# Patient Record
Sex: Female | Born: 1935
Health system: Southern US, Community
[De-identification: ages and names within clinical notes are randomized; demographics above are authoritative.]

## PROBLEM LIST (undated history)

## (undated) DIAGNOSIS — C50411 Malignant neoplasm of upper-outer quadrant of right female breast: Secondary | ICD-10-CM

## (undated) DIAGNOSIS — I499 Cardiac arrhythmia, unspecified: Secondary | ICD-10-CM

## (undated) DIAGNOSIS — B351 Tinea unguium: Secondary | ICD-10-CM

## (undated) DIAGNOSIS — J302 Other seasonal allergic rhinitis: Secondary | ICD-10-CM

## (undated) DIAGNOSIS — M1990 Unspecified osteoarthritis, unspecified site: Secondary | ICD-10-CM

## (undated) DIAGNOSIS — I872 Venous insufficiency (chronic) (peripheral): Secondary | ICD-10-CM

## (undated) DIAGNOSIS — Z8041 Family history of malignant neoplasm of ovary: Secondary | ICD-10-CM

## (undated) DIAGNOSIS — R600 Localized edema: Secondary | ICD-10-CM

## (undated) DIAGNOSIS — E049 Nontoxic goiter, unspecified: Secondary | ICD-10-CM

## (undated) DIAGNOSIS — Z974 Presence of external hearing-aid: Secondary | ICD-10-CM

## (undated) DIAGNOSIS — Z853 Personal history of malignant neoplasm of breast: Secondary | ICD-10-CM

## (undated) DIAGNOSIS — C4492 Squamous cell carcinoma of skin, unspecified: Secondary | ICD-10-CM

## (undated) DIAGNOSIS — N951 Menopausal and female climacteric states: Secondary | ICD-10-CM

## (undated) DIAGNOSIS — Z973 Presence of spectacles and contact lenses: Secondary | ICD-10-CM

## (undated) DIAGNOSIS — I10 Essential (primary) hypertension: Secondary | ICD-10-CM

## (undated) DIAGNOSIS — Z803 Family history of malignant neoplasm of breast: Secondary | ICD-10-CM

## (undated) HISTORY — PX: UMBILICAL HERNIA REPAIR: SHX196

## (undated) HISTORY — DX: Tinea unguium: B35.1

## (undated) HISTORY — DX: Menopausal and female climacteric states: N95.1

## (undated) HISTORY — PX: CATARACT EXTRACTION: SUR2

## (undated) HISTORY — PX: DILATION AND CURETTAGE OF UTERUS: SHX78

## (undated) HISTORY — DX: Other seasonal allergic rhinitis: J30.2

## (undated) HISTORY — DX: Malignant neoplasm of upper-outer quadrant of right female breast: C50.411

## (undated) HISTORY — PX: OTHER SURGICAL HISTORY: SHX169

## (undated) HISTORY — DX: Personal history of malignant neoplasm of breast: Z85.3

## (undated) HISTORY — DX: Venous insufficiency (chronic) (peripheral): I87.2

## (undated) HISTORY — PX: TONSILLECTOMY: SUR1361

## (undated) HISTORY — DX: Family history of malignant neoplasm of ovary: Z80.41

## (undated) HISTORY — DX: Family history of malignant neoplasm of breast: Z80.3

## (undated) HISTORY — DX: Unspecified osteoarthritis, unspecified site: M19.90

## (undated) HISTORY — DX: Nontoxic goiter, unspecified: E04.9

## (undated) HISTORY — DX: Squamous cell carcinoma of skin, unspecified: C44.92

---

## 1981-07-19 HISTORY — PX: OTHER SURGICAL HISTORY: SHX169

## 1999-01-12 ENCOUNTER — Other Ambulatory Visit: Admission: RE | Admit: 1999-01-12 | Discharge: 1999-01-12 | Payer: Self-pay | Admitting: Obstetrics and Gynecology

## 2000-06-20 ENCOUNTER — Ambulatory Visit (HOSPITAL_COMMUNITY): Admission: RE | Admit: 2000-06-20 | Discharge: 2000-06-20 | Payer: Self-pay | Admitting: Specialist

## 2001-01-10 ENCOUNTER — Other Ambulatory Visit: Admission: RE | Admit: 2001-01-10 | Discharge: 2001-01-10 | Payer: Self-pay | Admitting: Obstetrics and Gynecology

## 2002-06-01 ENCOUNTER — Encounter: Admission: RE | Admit: 2002-06-01 | Discharge: 2002-06-01 | Payer: Self-pay | Admitting: Surgery

## 2002-06-01 ENCOUNTER — Encounter: Payer: Self-pay | Admitting: Surgery

## 2002-06-04 ENCOUNTER — Ambulatory Visit (HOSPITAL_BASED_OUTPATIENT_CLINIC_OR_DEPARTMENT_OTHER): Admission: RE | Admit: 2002-06-04 | Discharge: 2002-06-04 | Payer: Self-pay | Admitting: Surgery

## 2004-09-28 ENCOUNTER — Ambulatory Visit: Payer: Self-pay | Admitting: Internal Medicine

## 2005-04-27 ENCOUNTER — Ambulatory Visit: Payer: Self-pay | Admitting: Internal Medicine

## 2006-01-06 ENCOUNTER — Encounter: Payer: Self-pay | Admitting: Internal Medicine

## 2006-01-31 ENCOUNTER — Encounter (INDEPENDENT_AMBULATORY_CARE_PROVIDER_SITE_OTHER): Payer: Self-pay | Admitting: Radiology

## 2006-01-31 ENCOUNTER — Encounter: Admission: RE | Admit: 2006-01-31 | Discharge: 2006-01-31 | Payer: Self-pay | Admitting: Surgery

## 2006-01-31 ENCOUNTER — Encounter (INDEPENDENT_AMBULATORY_CARE_PROVIDER_SITE_OTHER): Payer: Self-pay | Admitting: *Deleted

## 2006-01-31 ENCOUNTER — Encounter: Payer: Self-pay | Admitting: Internal Medicine

## 2006-02-07 ENCOUNTER — Encounter: Admission: RE | Admit: 2006-02-07 | Discharge: 2006-02-07 | Payer: Self-pay | Admitting: Surgery

## 2006-02-09 ENCOUNTER — Encounter: Admission: RE | Admit: 2006-02-09 | Discharge: 2006-02-09 | Payer: Self-pay | Admitting: Surgery

## 2006-02-09 ENCOUNTER — Encounter (INDEPENDENT_AMBULATORY_CARE_PROVIDER_SITE_OTHER): Payer: Self-pay | Admitting: Diagnostic Radiology

## 2006-02-09 ENCOUNTER — Encounter (INDEPENDENT_AMBULATORY_CARE_PROVIDER_SITE_OTHER): Payer: Self-pay | Admitting: *Deleted

## 2006-02-16 HISTORY — PX: OTHER SURGICAL HISTORY: SHX169

## 2006-03-10 ENCOUNTER — Ambulatory Visit (HOSPITAL_BASED_OUTPATIENT_CLINIC_OR_DEPARTMENT_OTHER): Admission: RE | Admit: 2006-03-10 | Discharge: 2006-03-10 | Payer: Self-pay | Admitting: Surgery

## 2006-03-10 ENCOUNTER — Encounter (INDEPENDENT_AMBULATORY_CARE_PROVIDER_SITE_OTHER): Payer: Self-pay | Admitting: Specialist

## 2006-03-10 ENCOUNTER — Encounter: Admission: RE | Admit: 2006-03-10 | Discharge: 2006-03-10 | Payer: Self-pay | Admitting: Surgery

## 2006-03-15 ENCOUNTER — Ambulatory Visit: Payer: Self-pay | Admitting: Oncology

## 2006-03-23 LAB — CBC WITH DIFFERENTIAL/PLATELET
BASO%: 0.4 % (ref 0.0–2.0)
Basophils Absolute: 0 10*3/uL (ref 0.0–0.1)
EOS%: 0.9 % (ref 0.0–7.0)
HGB: 12.1 g/dL (ref 11.6–15.9)
MCH: 31.6 pg (ref 26.0–34.0)
MCHC: 34.4 g/dL (ref 32.0–36.0)
MCV: 91.8 fL (ref 81.0–101.0)
MONO%: 5.6 % (ref 0.0–13.0)
RBC: 3.84 10*6/uL (ref 3.70–5.32)
RDW: 14 % (ref 11.3–14.5)
lymph#: 1.4 10*3/uL (ref 0.9–3.3)

## 2006-03-23 LAB — COMPREHENSIVE METABOLIC PANEL
ALT: 26 U/L (ref 0–40)
AST: 22 U/L (ref 0–37)
Albumin: 4 g/dL (ref 3.5–5.2)
Alkaline Phosphatase: 82 U/L (ref 39–117)
BUN: 21 mg/dL (ref 6–23)
Creatinine, Ser: 0.93 mg/dL (ref 0.40–1.20)
Potassium: 4.1 mEq/L (ref 3.5–5.3)

## 2006-03-29 ENCOUNTER — Ambulatory Visit: Payer: Self-pay | Admitting: Internal Medicine

## 2006-05-20 ENCOUNTER — Ambulatory Visit: Payer: Self-pay | Admitting: Oncology

## 2006-05-23 ENCOUNTER — Ambulatory Visit: Payer: Self-pay | Admitting: Internal Medicine

## 2006-06-06 ENCOUNTER — Ambulatory Visit (HOSPITAL_COMMUNITY): Admission: RE | Admit: 2006-06-06 | Discharge: 2006-06-06 | Payer: Self-pay | Admitting: Orthopedic Surgery

## 2006-06-20 ENCOUNTER — Ambulatory Visit: Payer: Self-pay | Admitting: Internal Medicine

## 2006-07-15 ENCOUNTER — Encounter: Payer: Self-pay | Admitting: Internal Medicine

## 2006-11-14 ENCOUNTER — Ambulatory Visit: Payer: Self-pay | Admitting: Oncology

## 2006-11-14 ENCOUNTER — Ambulatory Visit: Payer: Self-pay | Admitting: Internal Medicine

## 2006-11-17 LAB — COMPREHENSIVE METABOLIC PANEL
AST: 15 U/L (ref 0–37)
Alkaline Phosphatase: 75 U/L (ref 39–117)
BUN: 20 mg/dL (ref 6–23)
Calcium: 8.8 mg/dL (ref 8.4–10.5)
Creatinine, Ser: 0.9 mg/dL (ref 0.40–1.20)
Total Bilirubin: 0.4 mg/dL (ref 0.3–1.2)

## 2006-11-17 LAB — CBC WITH DIFFERENTIAL/PLATELET
Eosinophils Absolute: 0 10*3/uL (ref 0.0–0.5)
HCT: 33.4 % — ABNORMAL LOW (ref 34.8–46.6)
LYMPH%: 21.3 % (ref 14.0–48.0)
MCV: 88.9 fL (ref 81.0–101.0)
MONO#: 0.3 10*3/uL (ref 0.1–0.9)
NEUT#: 3.9 10*3/uL (ref 1.5–6.5)
NEUT%: 71.3 % (ref 39.6–76.8)
Platelets: 263 10*3/uL (ref 145–400)
WBC: 5.4 10*3/uL (ref 3.9–10.0)

## 2007-01-31 ENCOUNTER — Encounter: Payer: Self-pay | Admitting: Internal Medicine

## 2007-02-02 DIAGNOSIS — M1990 Unspecified osteoarthritis, unspecified site: Secondary | ICD-10-CM | POA: Insufficient documentation

## 2007-02-02 DIAGNOSIS — J309 Allergic rhinitis, unspecified: Secondary | ICD-10-CM | POA: Insufficient documentation

## 2007-02-09 ENCOUNTER — Ambulatory Visit: Payer: Self-pay | Admitting: Internal Medicine

## 2007-02-09 DIAGNOSIS — Z853 Personal history of malignant neoplasm of breast: Secondary | ICD-10-CM

## 2007-02-09 DIAGNOSIS — L270 Generalized skin eruption due to drugs and medicaments taken internally: Secondary | ICD-10-CM | POA: Insufficient documentation

## 2007-05-02 ENCOUNTER — Ambulatory Visit: Payer: Self-pay | Admitting: Internal Medicine

## 2007-05-22 ENCOUNTER — Ambulatory Visit: Payer: Self-pay | Admitting: Oncology

## 2007-05-24 ENCOUNTER — Encounter: Payer: Self-pay | Admitting: Internal Medicine

## 2007-05-24 LAB — CBC WITH DIFFERENTIAL/PLATELET
Basophils Absolute: 0 10*3/uL (ref 0.0–0.1)
Eosinophils Absolute: 0 10*3/uL (ref 0.0–0.5)
HCT: 33.7 % — ABNORMAL LOW (ref 34.8–46.6)
HGB: 12 g/dL (ref 11.6–15.9)
MCV: 89.5 fL (ref 81.0–101.0)
MONO%: 6.6 % (ref 0.0–13.0)
NEUT#: 3.5 10*3/uL (ref 1.5–6.5)
NEUT%: 68.2 % (ref 39.6–76.8)
Platelets: 270 10*3/uL (ref 145–400)
RDW: 12.8 % (ref 11.3–14.5)

## 2007-05-24 LAB — COMPREHENSIVE METABOLIC PANEL
Albumin: 3.3 g/dL — ABNORMAL LOW (ref 3.5–5.2)
Alkaline Phosphatase: 61 U/L (ref 39–117)
BUN: 22 mg/dL (ref 6–23)
Calcium: 8.7 mg/dL (ref 8.4–10.5)
Creatinine, Ser: 1.12 mg/dL (ref 0.40–1.20)
Glucose, Bld: 111 mg/dL — ABNORMAL HIGH (ref 70–99)
Potassium: 4.4 mEq/L (ref 3.5–5.3)

## 2007-09-06 ENCOUNTER — Encounter: Payer: Self-pay | Admitting: Internal Medicine

## 2007-10-18 ENCOUNTER — Ambulatory Visit: Payer: Self-pay | Admitting: Internal Medicine

## 2007-10-18 DIAGNOSIS — J069 Acute upper respiratory infection, unspecified: Secondary | ICD-10-CM | POA: Insufficient documentation

## 2007-11-17 ENCOUNTER — Ambulatory Visit: Payer: Self-pay | Admitting: Oncology

## 2007-11-21 ENCOUNTER — Encounter: Payer: Self-pay | Admitting: Internal Medicine

## 2007-12-12 ENCOUNTER — Telehealth: Payer: Self-pay | Admitting: Internal Medicine

## 2008-01-24 ENCOUNTER — Ambulatory Visit: Payer: Self-pay | Admitting: Internal Medicine

## 2008-01-24 DIAGNOSIS — M810 Age-related osteoporosis without current pathological fracture: Secondary | ICD-10-CM

## 2008-03-19 ENCOUNTER — Telehealth: Payer: Self-pay | Admitting: Internal Medicine

## 2008-05-21 ENCOUNTER — Ambulatory Visit: Payer: Self-pay | Admitting: Oncology

## 2008-05-23 ENCOUNTER — Encounter: Payer: Self-pay | Admitting: Internal Medicine

## 2008-05-23 LAB — CBC WITH DIFFERENTIAL/PLATELET
BASO%: 0.2 % (ref 0.0–2.0)
EOS%: 0.7 % (ref 0.0–7.0)
HCT: 34 % — ABNORMAL LOW (ref 34.8–46.6)
MCH: 31.8 pg (ref 26.0–34.0)
MCHC: 34.5 g/dL (ref 32.0–36.0)
MCV: 92.1 fL (ref 81.0–101.0)
RBC: 3.69 10*6/uL — ABNORMAL LOW (ref 3.70–5.32)
RDW: 13.3 % (ref 11.3–14.5)
lymph#: 1.3 10*3/uL (ref 0.9–3.3)

## 2008-05-23 LAB — COMPREHENSIVE METABOLIC PANEL
ALT: 15 U/L (ref 0–35)
Alkaline Phosphatase: 47 U/L (ref 39–117)
Chloride: 107 mEq/L (ref 96–112)
Total Bilirubin: 0.6 mg/dL (ref 0.3–1.2)
Total Protein: 6.2 g/dL (ref 6.0–8.3)

## 2008-07-08 ENCOUNTER — Ambulatory Visit: Payer: Self-pay | Admitting: Internal Medicine

## 2008-07-25 ENCOUNTER — Ambulatory Visit: Payer: Self-pay | Admitting: Internal Medicine

## 2009-02-05 ENCOUNTER — Ambulatory Visit: Payer: Self-pay | Admitting: Internal Medicine

## 2009-02-05 DIAGNOSIS — E041 Nontoxic single thyroid nodule: Secondary | ICD-10-CM

## 2009-02-05 DIAGNOSIS — I872 Venous insufficiency (chronic) (peripheral): Secondary | ICD-10-CM | POA: Insufficient documentation

## 2009-02-07 ENCOUNTER — Telehealth: Payer: Self-pay | Admitting: Internal Medicine

## 2009-02-10 ENCOUNTER — Encounter: Admission: RE | Admit: 2009-02-10 | Discharge: 2009-02-10 | Payer: Self-pay | Admitting: Internal Medicine

## 2009-03-05 ENCOUNTER — Ambulatory Visit: Payer: Self-pay | Admitting: Internal Medicine

## 2009-03-05 DIAGNOSIS — E042 Nontoxic multinodular goiter: Secondary | ICD-10-CM

## 2009-04-10 ENCOUNTER — Ambulatory Visit: Payer: Self-pay | Admitting: Internal Medicine

## 2009-04-10 LAB — CONVERTED CEMR LAB
ALT: 17 units/L (ref 0–35)
AST: 20 units/L (ref 0–37)
Albumin: 3.5 g/dL (ref 3.5–5.2)
BUN: 17 mg/dL (ref 6–23)
Bilirubin, Direct: 0 mg/dL (ref 0.0–0.3)
Calcium: 9.1 mg/dL (ref 8.4–10.5)
Eosinophils Absolute: 0 10*3/uL (ref 0.0–0.7)
Eosinophils Relative: 0.8 % (ref 0.0–5.0)
GFR calc non Af Amer: 50.14 mL/min (ref >60)
HCT: 34.4 % — ABNORMAL LOW (ref 36.0–46.0)
HDL: 46.2 mg/dL (ref >39.00)
Hemoglobin: 11.6 g/dL — ABNORMAL LOW (ref 12.0–15.0)
Lymphs Abs: 1.3 10*3/uL (ref 0.7–4.0)
Monocytes Relative: 5.5 % (ref 3.0–12.0)
Neutrophils Relative %: 69.8 % (ref 43.0–77.0)
Platelets: 202 10*3/uL (ref 150.0–400.0)
RBC: 3.74 M/uL — ABNORMAL LOW (ref 3.87–5.11)
Total Protein: 6.4 g/dL (ref 6.0–8.3)
WBC: 5.5 10*3/uL (ref 4.5–10.5)

## 2009-05-23 ENCOUNTER — Ambulatory Visit: Payer: Self-pay | Admitting: Oncology

## 2009-05-27 ENCOUNTER — Encounter (INDEPENDENT_AMBULATORY_CARE_PROVIDER_SITE_OTHER): Payer: Self-pay | Admitting: *Deleted

## 2009-05-27 LAB — CBC WITH DIFFERENTIAL/PLATELET
EOS%: 0.8 % (ref 0.0–7.0)
MCH: 30.9 pg (ref 25.1–34.0)
MCV: 91.3 fL (ref 79.5–101.0)
MONO#: 0.3 10*3/uL (ref 0.1–0.9)
MONO%: 5.9 % (ref 0.0–14.0)
NEUT#: 3.7 10*3/uL (ref 1.5–6.5)
NEUT%: 68.6 % (ref 38.4–76.8)
RBC: 3.78 10*6/uL (ref 3.70–5.45)
RDW: 13.5 % (ref 11.2–14.5)
lymph#: 1.3 10*3/uL (ref 0.9–3.3)

## 2009-05-27 LAB — CANCER ANTIGEN 27.29: CA 27.29: 38 U/mL (ref 0–39)

## 2009-05-27 LAB — COMPREHENSIVE METABOLIC PANEL
Albumin: 3.4 g/dL — ABNORMAL LOW (ref 3.5–5.2)
CO2: 29 mEq/L (ref 19–32)
Calcium: 9.3 mg/dL (ref 8.4–10.5)
Chloride: 102 mEq/L (ref 96–112)
Creatinine, Ser: 1.27 mg/dL — ABNORMAL HIGH (ref 0.40–1.20)

## 2009-05-27 LAB — VITAMIN D 25 HYDROXY (VIT D DEFICIENCY, FRACTURES): Vit D, 25-Hydroxy: 27 ng/mL — ABNORMAL LOW (ref 30–89)

## 2009-08-04 ENCOUNTER — Encounter: Payer: Self-pay | Admitting: Internal Medicine

## 2009-08-27 ENCOUNTER — Ambulatory Visit: Payer: Self-pay | Admitting: Internal Medicine

## 2009-09-01 ENCOUNTER — Ambulatory Visit: Payer: Self-pay | Admitting: Internal Medicine

## 2009-10-28 ENCOUNTER — Ambulatory Visit: Payer: Self-pay | Admitting: Internal Medicine

## 2009-10-28 DIAGNOSIS — R300 Dysuria: Secondary | ICD-10-CM | POA: Insufficient documentation

## 2009-10-28 DIAGNOSIS — N952 Postmenopausal atrophic vaginitis: Secondary | ICD-10-CM

## 2009-10-28 LAB — CONVERTED CEMR LAB
Glucose, Urine, Semiquant: NEGATIVE
Nitrite: NEGATIVE
Specific Gravity, Urine: 1.025
Urobilinogen, UA: 0.2
pH: 5

## 2010-04-14 ENCOUNTER — Ambulatory Visit: Payer: Self-pay | Admitting: Internal Medicine

## 2010-04-14 ENCOUNTER — Encounter: Payer: Self-pay | Admitting: Internal Medicine

## 2010-04-14 DIAGNOSIS — M810 Age-related osteoporosis without current pathological fracture: Secondary | ICD-10-CM

## 2010-04-14 LAB — CONVERTED CEMR LAB
Albumin: 3.7 g/dL (ref 3.5–5.2)
BUN: 18 mg/dL (ref 6–23)
Bilirubin, Direct: 0.1 mg/dL (ref 0.0–0.3)
Creatinine, Ser: 1.1 mg/dL (ref 0.4–1.2)
GFR calc non Af Amer: 50.02 mL/min (ref >60)
Glucose, Bld: 111 mg/dL — ABNORMAL HIGH (ref 70–99)
MCHC: 33.9 g/dL (ref 30.0–36.0)
Neutro Abs: 4.7 10*3/uL (ref 1.4–7.7)
Platelets: 194 10*3/uL (ref 150.0–400.0)
Sodium: 142 meq/L (ref 135–145)
TSH: 2.16 microintl units/mL (ref 0.35–5.50)
Total Bilirubin: 0.2 mg/dL — ABNORMAL LOW (ref 0.3–1.2)

## 2010-05-18 ENCOUNTER — Ambulatory Visit: Payer: Self-pay | Admitting: Oncology

## 2010-05-20 LAB — CBC WITH DIFFERENTIAL/PLATELET
Basophils Absolute: 0 10*3/uL (ref 0.0–0.1)
HCT: 31.9 % — ABNORMAL LOW (ref 34.8–46.6)
MCHC: 34.6 g/dL (ref 31.5–36.0)
MCV: 90.3 fL (ref 79.5–101.0)
MONO#: 0.3 10*3/uL (ref 0.1–0.9)
NEUT%: 71.9 % (ref 38.4–76.8)
RBC: 3.53 10*6/uL — ABNORMAL LOW (ref 3.70–5.45)
RDW: 13.3 % (ref 11.2–14.5)

## 2010-05-21 LAB — COMPREHENSIVE METABOLIC PANEL
Alkaline Phosphatase: 56 U/L (ref 39–117)
BUN: 20 mg/dL (ref 6–23)
CO2: 24 mEq/L (ref 19–32)
Calcium: 8.6 mg/dL (ref 8.4–10.5)
Chloride: 107 mEq/L (ref 96–112)
Glucose, Bld: 134 mg/dL — ABNORMAL HIGH (ref 70–99)
Total Protein: 6.3 g/dL (ref 6.0–8.3)

## 2010-05-21 LAB — CANCER ANTIGEN 27.29: CA 27.29: 31 U/mL (ref 0–39)

## 2010-05-27 ENCOUNTER — Ambulatory Visit: Payer: Self-pay | Admitting: Internal Medicine

## 2010-05-27 DIAGNOSIS — L255 Unspecified contact dermatitis due to plants, except food: Secondary | ICD-10-CM

## 2010-06-29 ENCOUNTER — Ambulatory Visit: Payer: Self-pay | Admitting: Oncology

## 2010-07-01 LAB — CBC & DIFF AND RETIC
BASO%: 0.5 % (ref 0.0–2.0)
EOS%: 0.7 % (ref 0.0–7.0)
HCT: 34.2 % — ABNORMAL LOW (ref 34.8–46.6)
HGB: 11.2 g/dL — ABNORMAL LOW (ref 11.6–15.9)
MCH: 29.7 pg (ref 25.1–34.0)
MONO%: 4.6 % (ref 0.0–14.0)
NEUT#: 4.3 10*3/uL (ref 1.5–6.5)
NEUT%: 70.9 % (ref 38.4–76.8)
Platelets: 187 10*3/uL (ref 145–400)
RBC: 3.77 10*6/uL (ref 3.70–5.45)
Retic %: 1.09 % (ref 0.50–1.50)
WBC: 6.1 10*3/uL (ref 3.9–10.3)
lymph#: 1.4 10*3/uL (ref 0.9–3.3)
nRBC: 0 % (ref 0–0)

## 2010-07-01 LAB — FOLATE: Folate: >20 ng/mL

## 2010-07-06 ENCOUNTER — Telehealth: Payer: Self-pay | Admitting: Internal Medicine

## 2010-07-07 ENCOUNTER — Encounter: Payer: Self-pay | Admitting: Internal Medicine

## 2010-08-18 NOTE — Letter (Signed)
Summary: [facility]  [facility]   Imported By: Maryln Gottron 06/05/2010 15:06:25  _____________________________________________________________________  External Attachment:    Type:   Image     Comment:   External Document

## 2010-08-18 NOTE — Assessment & Plan Note (Signed)
Summary: flu/njr   Vital Signs:  Patient profile:   77 year old female Weight:      175 pounds Temp:     99.3 degrees F oral BP sitting:   106 / 62  (left arm) Cuff size:   regular  Vitals Entered By: Raechel Ache, RN (August 27, 2009 10:42 AM) CC: c/o cough, congestion, fatigue, headache - flu sx   CC:  c/o cough, congestion, fatigue, and headache - flu sx.  History of Present Illness: 77 year old patient with a 3 to 4-day history of URI symptoms.  Complains include head and chest congestion, mild, nonproductive cough, low-grade fever and fatigue.  There's been no chest pain, shortness of breath, or any purulent sputum production.  Cough has been somewhat bothersome interfering with sleep.  She has been using OTC medications with some benefit.  She has a history of allergic rhinitis, which has been stable  Allergies: 1)  ! Celebrex  Past History:  Past Medical History: Reviewed history from 02/05/2009 and no changes required. Allergic rhinitis,seasonal Osteoarthritis Menopausal syndrome Breast cancer, hx of  bilateral August 2007 history of squamous cell skin cancer right lower leg chronic venous insufficiency  Review of Systems       The patient complains of prolonged cough.  The patient denies anorexia, fever, weight loss, weight gain, vision loss, decreased hearing, hoarseness, chest pain, syncope, dyspnea on exertion, peripheral edema, headaches, hemoptysis, abdominal pain, melena, hematochezia, severe indigestion/heartburn, hematuria, incontinence, genital sores, muscle weakness, suspicious skin lesions, transient blindness, difficulty walking, depression, unusual weight change, abnormal bleeding, enlarged lymph nodes, angioedema, and breast masses.    Physical Exam  General:  elderly alert, no distress Head:  Normocephalic and atraumatic without obvious abnormalities. No apparent alopecia or balding. Eyes:  No corneal or conjunctival inflammation noted. EOMI.  Perrla. Funduscopic exam benign, without hemorrhages, exudates or papilledema. Vision grossly normal. Ears:  External ear exam shows no significant lesions or deformities.  Otoscopic examination reveals clear canals, tympanic membranes are intact bilaterally without bulging, retraction, inflammation or discharge. Hearing is grossly normal bilaterally. Mouth:  Oral mucosa and oropharynx without lesions or exudates.  Teeth in good repair. Neck:  No deformities, masses, or tenderness noted. Lungs:  Normal respiratory effort, chest expands symmetrically. Lungs are clear to auscultation, no crackles or wheezes. O2 saturation 97% Heart:  Normal rate and regular rhythm. S1 and S2 normal without gallop, murmur, click, rub or other extra sounds. pulse rate 84 Msk:  No deformity or scoliosis noted of thoracic or lumbar spine.   Extremities:  No clubbing, cyanosis, edema, or deformity noted with normal full range of motion of all joints.     Impression & Recommendations:  Problem # 1:  URI (ICD-465.9)  Her updated medication list for this problem includes:    Fexofenadine Hcl 180 Mg Tabs (Fexofenadine hcl) ..... One daily as needed    Hydrocodone-homatropine 5-1.5 Mg/60ml Syrp (Hydrocodone-homatropine) ..... One half to 1 teaspoon every 6 hours as needed for cough  Her updated medication list for this problem includes:    Fexofenadine Hcl 180 Mg Tabs (Fexofenadine hcl) ..... One daily as needed    Hydrocodone-homatropine 5-1.5 Mg/71ml Syrp (Hydrocodone-homatropine) ..... One half to 1 teaspoon every 6 hours as needed for cough  Problem # 2:  ALLERGIC RHINITIS (ICD-477.9)  Her updated medication list for this problem includes:    Fexofenadine Hcl 180 Mg Tabs (Fexofenadine hcl) ..... One daily as needed    Fluticasone Propionate 50 Mcg/act Susp (Fluticasone  propionate) ..... Use daily as needed  Her updated medication list for this problem includes:    Fexofenadine Hcl 180 Mg Tabs (Fexofenadine hcl)  ..... One daily as needed    Fluticasone Propionate 50 Mcg/act Susp (Fluticasone propionate) ..... Use daily as needed  Complete Medication List: 1)  Tamoxifen Citrate 10 Mg Tabs (Tamoxifen citrate) .Marland Kitchen.. 1 once daily 2)  Reclast 5 Mg/15ml Soln (Zoledronic acid) 3)  Fexofenadine Hcl 180 Mg Tabs (Fexofenadine hcl) .... One daily as needed 4)  Fluticasone Propionate 50 Mcg/act Susp (Fluticasone propionate) .... Use daily as needed 5)  Furosemide 40 Mg Tabs (Furosemide) .... One daily 6)  Hydrocodone-homatropine 5-1.5 Mg/58ml Syrp (Hydrocodone-homatropine) .... One half to 1 teaspoon every 6 hours as needed for cough  Patient Instructions: 1)  Get plenty of rest, drink lots of clear liquids, and use Tylenol or Ibuprofen for fever and comfort. Return in 7-10 days if you're not better:sooner if you're feeling worse. Prescriptions: HYDROCODONE-HOMATROPINE 5-1.5 MG/5ML SYRP (HYDROCODONE-HOMATROPINE) one half to 1 teaspoon every 6 hours as needed for cough  #4 oz x 0   Entered and Authorized by:   [name]  MD   Signed by:   [name]  MD on 08/27/2009   Method used:   Print then Give to Patient   RxID:   [id number]

## 2010-08-18 NOTE — Assessment & Plan Note (Signed)
Summary: Upper respiratory inf/viral inf/cjr   Vital Signs:  Patient profile:   77 year old female Weight:      176 pounds O2 Sat:      96 % on Room air Temp:     98.2 degrees F oral BP sitting:   106 / 62  (right arm) Cuff size:   regular  Vitals Entered By: Duard Brady LPN (September 01, 2009 11:12 AM)  O2 Flow:  Room air CC: c/o still with cough- waking , congestion   CC:  c/o still with cough- waking  and congestion.  History of Present Illness: 77 year old patient who was seen 5 days ago for a URI.  Her husband  was concerned about her persistent cough.  she has had no fever, and cough is nonproductive.  She states that she feels improved.  Denies any chest pain or shortness of breath.  Cough mainly is at night, and associated with the postnasal drip. She does have a history of allergic rhinitis and maintenance medication includes fenofexadinene and fluticasone.  Allergies: 1)  ! Celebrex  Past History:  Past Medical History: Reviewed history from 02/05/2009 and no changes required. Allergic rhinitis,seasonal Osteoarthritis Menopausal syndrome Breast cancer, hx of  bilateral August 2007 history of squamous cell skin cancer right lower leg chronic venous insufficiency  Review of Systems       The patient complains of prolonged cough.  The patient denies anorexia, fever, weight loss, weight gain, vision loss, decreased hearing, hoarseness, chest pain, syncope, dyspnea on exertion, peripheral edema, headaches, hemoptysis, abdominal pain, melena, hematochezia, severe indigestion/heartburn, hematuria, incontinence, genital sores, muscle weakness, suspicious skin lesions, transient blindness, difficulty walking, depression, unusual weight change, abnormal bleeding, enlarged lymph nodes, angioedema, and breast masses.    Physical Exam  General:  Well-developed,well-nourished,in no acute distress; alert,appropriate and cooperative throughout examination Head:   Normocephalic and atraumatic without obvious abnormalities. No apparent alopecia or balding. Eyes:  No corneal or conjunctival inflammation noted. EOMI. Perrla. Funduscopic exam benign, without hemorrhages, exudates or papilledema. Vision grossly normal. Ears:  External ear exam shows no significant lesions or deformities.  Otoscopic examination reveals clear canals, tympanic membranes are intact bilaterally without bulging, retraction, inflammation or discharge. Hearing is grossly normal bilaterally. Mouth:  Oral mucosa and oropharynx without lesions or exudates.  Teeth in good repair. Neck:  No deformities, masses, or tenderness noted. Lungs:  Normal respiratory effort, chest expands symmetrically. Lungs are clear to auscultation, no crackles or wheezes. Heart:  Normal rate and regular rhythm. S1 and S2 normal without gallop, murmur, click, rub or other extra sounds.   Impression & Recommendations:  Problem # 1:  URI (ICD-465.9)  Her updated medication list for this problem includes:    Fexofenadine Hcl 180 Mg Tabs (Fexofenadine hcl) ..... One daily as needed    Hydrocodone-homatropine 5-1.5 Mg/27ml Syrp (Hydrocodone-homatropine) ..... One half to 1 teaspoon every 6 hours as needed for cough  Problem # 2:  ALLERGIC RHINITIS (ICD-477.9)  Her updated medication list for this problem includes:    Fexofenadine Hcl 180 Mg Tabs (Fexofenadine hcl) ..... One daily as needed    Fluticasone Propionate 50 Mcg/act Susp (Fluticasone propionate) ..... Use daily as needed  Complete Medication List: 1)  Tamoxifen Citrate 10 Mg Tabs (Tamoxifen citrate) .Marland Kitchen.. 1 once daily 2)  Reclast 5 Mg/165ml Soln (Zoledronic acid) 3)  Fexofenadine Hcl 180 Mg Tabs (Fexofenadine hcl) .... One daily as needed 4)  Fluticasone Propionate 50 Mcg/act Susp (Fluticasone propionate) .... Use daily as needed  5)  Furosemide 40 Mg Tabs (Furosemide) .... One daily 6)  Hydrocodone-homatropine 5-1.5 Mg/3ml Syrp  (Hydrocodone-homatropine) .... One half to 1 teaspoon every 6 hours as needed for cough  Patient Instructions: 1)  Get plenty of rest, drink lots of clear liquids, and use Tylenol or Ibuprofen for fever and comfort. Return in 7-10 days if you're not better:sooner if you're feeling worse. Prescriptions: HYDROCODONE-HOMATROPINE 5-1.5 MG/5ML SYRP (HYDROCODONE-HOMATROPINE) one half to 1 teaspoon every 6 hours as needed for cough  #6 oz x 1   Entered and Authorized by:   [name]  MD   Signed by:   [name]  MD on 09/01/2009   Method used:   Print then Give to Patient   RxID:   [id number]

## 2010-08-18 NOTE — Assessment & Plan Note (Signed)
Summary: yeast infection/dm   Vital Signs:  Patient profile:   77 year old female Weight:      176 pounds Temp:     98.2 degrees F oral BP sitting:   130 / 70  (right arm) Cuff size:   regular  Vitals Entered By: Duard Brady LPN (October 28, 2009 9:27 AM) CC: c/o vaginal redness and itching - ??yeast infection Is Patient Diabetic? No   CC:  c/o vaginal redness and itching - ??yeast infection.  History of Present Illness: 4 -year-old patient, who presents with history of vaginal pruritus and redness.  Medical regimen includes the tamoxifen.  She has a history of breast cancer.  there is been no discharge or dysuria.  She also is a history of allergic rhinitis taking antihistamines and is also on diuretic therapy  Allergies: 1)  ! Celebrex  Past History:  Past Medical History: Reviewed history from 02/05/2009 and no changes required. Allergic rhinitis,seasonal Osteoarthritis Menopausal syndrome Breast cancer, hx of  bilateral August 2007 history of squamous cell skin cancer right lower leg chronic venous insufficiency  Physical Exam  General:  overweight-appearing.  normal blood pressure Mouth:  Oral mucosa and oropharynx without lesions or exudates.  Teeth in good repair. Neck:  No deformities, masses, or tenderness noted. Lungs:  Normal respiratory effort, chest expands symmetrically. Lungs are clear to auscultation, no crackles or wheezes. Heart:  Normal rate and regular rhythm. S1 and S2 normal without gallop, murmur, click, rub or other extra sounds. Abdomen:  Bowel sounds positive,abdomen soft and non-tender without masses, organomegaly or hernias noted. Genitalia:  labial atrophy and hypopigmentation; small introitusvaginal stenosis and vaginal atrophy.     Impression & Recommendations:  Problem # 1:  VAGINITIS, ATROPHIC (ICD-627.3) will give a trial of nystatin triamcinolone; if unimproved will follow-up with the GYN and  consider Premarin vaginal  cream  Problem # 2:  DYSURIA (ICD-788.1)  Orders: UA Dipstick w/o Micro (manual) (62694)  Problem # 3:  SENILE OSTEOPOROSIS (ICD-733.01)  Her updated medication list for this problem includes:    Reclast 5 Mg/177ml Soln (Zoledronic acid)  Complete Medication List: 1)  Tamoxifen Citrate 10 Mg Tabs (Tamoxifen citrate) .Marland Kitchen.. 1 once daily 2)  Reclast 5 Mg/117ml Soln (Zoledronic acid) 3)  Fexofenadine Hcl 180 Mg Tabs (Fexofenadine hcl) .... One daily as needed 4)  Fluticasone Propionate 50 Mcg/act Susp (Fluticasone propionate) .... Use daily as needed 5)  Furosemide 40 Mg Tabs (Furosemide) .... One daily 6)  Hydrocodone-homatropine 5-1.5 Mg/84ml Syrp (Hydrocodone-homatropine) .... One half to 1 teaspoon every 6 hours as needed for cough 7)  Nystatin-triamcinolone 100000-0.1 Unit/gm-% Crea (Nystatin-triamcinolone) .... Use twice daily  Patient Instructions: 1)  use vaginal cream twice daily as directed 2)  call if unimproved 3)  Please schedule a follow-up appointment as needed. Prescriptions: NYSTATIN-TRIAMCINOLONE 100000-0.1 UNIT/GM-% CREA (NYSTATIN-TRIAMCINOLONE) use twice daily  #120 gm x 1   Entered and Authorized by:   [name]  MD   Signed by:   [name]  MD on 10/28/2009   Method used:   Print then Give to Patient   RxID:   [id number] NYSTATIN-TRIAMCINOLONE 100000-0.1 UNIT/GM-% CREA (NYSTATIN-TRIAMCINOLONE) use twice daily  #120 gm x 1   Entered and Authorized by:   [name]  MD   Signed by:   [name]  MD on 10/28/2009   Method used:   [facility]        [facility] [phone]* (retail)  [address], [place]  [address]       Ph: [phone] or [phone]       Fax: [phone]   RxID:   [id number]   Laboratory Results   Urine Tests  Date/Time Received: October 28, 2009 9:42 AM  Date/Time Reported: October 28, 2009 9:42 AM   Routine Urinalysis   Color: yellow Appearance:  Clear Glucose: negative   (Normal Range: Negative) Bilirubin: negative   (Normal Range: Negative) Ketone: negative   (Normal Range: Negative) Spec. Gravity: 1.025   (Normal Range: 1.003-1.035) Blood: trace-intact   (Normal Range: Negative) pH: 5.0   (Normal Range: 5.0-8.0) Protein: trace   (Normal Range: Negative) Urobilinogen: 0.2   (Normal Range: 0-1) Nitrite: negative   (Normal Range: Negative) Leukocyte Esterace: trace   (Normal Range: Negative)

## 2010-08-18 NOTE — Assessment & Plan Note (Signed)
Summary: bilateral arm rash/dm   Vital Signs:  Patient profile:   77 year old female Weight:      182 pounds Temp:     98.0 degrees F oral BP sitting:   120 / 80  (left arm) Cuff size:   regular  Vitals Entered By: Duard Brady LPN (May 27, 2010 8:15 AM) CC: c/o rash to lower arms  Is Patient Diabetic? No   CC:  c/o rash to lower arms .  History of Present Illness:  77 year old patient who has a very erythematous dermatitis involving the outer aspect of her right on for the past 4 weeks.  She states this started after doing yard work and exposure to [employer].  She has been using topical moisturizing creams without much benefit.  She has some minimal involvement involving her left arm. She has a history of osteoporosis and is scheduled for her annual injection of Reclast today.  Allergies: 1)  ! Celebrex  Physical Exam  General:  Well-developed,well-nourished,in no acute distress; alert,appropriate and cooperative throughout examination Skin:  erythematous rash involving the outer aspect of her right arm   Impression & Recommendations:  Problem # 1:  OSTEOPOROSIS (ICD-733.00)  Her updated medication list for this problem includes:    Reclast 5 Mg/138ml Soln (Zoledronic acid)  Problem # 2:  RHUS DERMATITIS (ICD-692.6)  Her updated medication list for this problem includes:    Fexofenadine Hcl 180 Mg Tabs (Fexofenadine hcl) ..... One daily as needed    Triamcinolone Acetonide 0.1 % Crea (Triamcinolone acetonide) .Marland Kitchen... Apply to the rash twice daily  Complete Medication List: 1)  Tamoxifen Citrate 10 Mg Tabs (Tamoxifen citrate) .Marland Kitchen.. 1 once daily 2)  Reclast 5 Mg/166ml Soln (Zoledronic acid) 3)  Fexofenadine Hcl 180 Mg Tabs (Fexofenadine hcl) .... One daily as needed 4)  Fluticasone Propionate 50 Mcg/act Susp (Fluticasone propionate) .... Use daily as needed 5)  Furosemide 40 Mg Tabs (Furosemide) .... One daily 6)  Triamcinolone Acetonide 0.1 % Crea (Triamcinolone  acetonide) .... Apply to the rash twice daily  Patient Instructions: 1)  Please schedule a follow-up appointment as needed. 2)  Limit your Sodium (Salt) to less than 2 grams a day(slightly less than 1/2 a teaspoon) to prevent fluid retention, swelling, or worsening of symptoms. 3)  It is important that you exercise regularly at least 20 minutes 5 times a week. If you develop chest pain, have severe difficulty breathing, or feel very tired , stop exercising immediately and seek medical attention. Prescriptions: TRIAMCINOLONE ACETONIDE 0.1 % CREA (TRIAMCINOLONE ACETONIDE) apply to the rash twice daily  #120 gm x 1   Entered and Authorized by:   [name]  MD   Signed by:   [name]  MD on 05/27/2010   Method used:   [facility]        [facility] [phone]* (retail)       [address] [place]  [address]       Ph: [phone] or [phone]       Fax: [phone]   RxID:   [id number]    Orders Added: 1)  Est. Patient Level III [56387]

## 2010-08-18 NOTE — Assessment & Plan Note (Signed)
Summary: emp--will fast//ccm   Vital Signs:  Patient profile:   77 year old female Height:      62.5 inches Weight:      182 pounds BMI:     32.88 Temp:     98.5 degrees F oral BP sitting:   132 / 70  (right arm) Cuff size:   regular  Vitals Entered By: Duard Brady LPN (April 14, 2010 9:45 AM) CC: cpx - doing well Is Patient Diabetic? No Flu Vaccine Consent Questions     Do you have a history of severe allergic reactions to this vaccine? no    Any prior history of allergic reactions to egg and/or gelatin? no    Do you have a sensitivity to the preservative Thimersol? no    Do you have a past history of Guillan-Barre Syndrome? no    Do you currently have an acute febrile illness? no    Have you ever had a severe reaction to latex? no    Vaccine information given and explained to patient? yes    Are you currently pregnant? no    Lot Number:[id number]   Exp Date:01/16/2011   Site Given  Left Deltoid IM   CC:  cpx - doing well.  History of Present Illness: 77 year old patient who is seen today for a wellness exam.  She has a history of breast cancer, and senile osteoporosis, and is followed by oncology annually.  She is doing quite well.  Today.  She has a history of osteoarthritis, allergic rhinitis , mild chronic  venous insufficiency.  She has done quite well and denies any cardiopulmonary complaints.  Here for Medicare AWV:  1.   Risk factors based on Past M, S, F history:  Ms. factors include aging.  Denies any cardiopulmonary complaints 2.   Physical Activities: active with aspects of daily living 3.   Depression/mood: history depression, or mood disorder 4.   Hearing: was a hearing aid on the left 5.   ADL's: independent in all aspects of daily living 6.   Fall Risk: moderate due to age 48.   Home Safety: no problems identified 8.   Height, weight, &visual acuity:height and weight stable.  No change in visual acuity does receive annual reclast  injections 9.    Counseling: oncology follow-up recommended 10.   Labs ordered based on risk factors: laboratory panel will be reviewed 11.           Referral Coordination- oncology referral 12.           Care Plan- heart healthy diet more regular exercise.  Encouraged 13.            Cognitive Assessment- alert and oriented normal affect.  No memory disturbance.  Able to handle all executive functioning without difficulty   Preventive Screening-Counseling & Management  Alcohol-Tobacco     Smoking Status: never  Allergies: 1)  ! Celebrex  Past History:  Past Medical History: Allergic rhinitis,seasonal Osteoarthritis Menopausal syndrome Breast cancer, hx of  bilateral August 2007 history of squamous cell skin cancer right lower leg chronic venous insufficiency Osteoporosis Allergic rhinitis Goiter/ R thyroid nodule  Past Surgical History: Lt eardrum sx-1983 Cataract extraction Tonsillectomy D&C Umbilical hernia repair bilateral lumpectomies for breast cancer in August 2007 left  knee arthroscopic surgery history of lumbar compression fracture status post resection squamous cell cancer right lower leg no screening colonoscopy s/p bilat lumpectomies  Family History: Reviewed history from 01/24/2008 and no changes required. Fam hx CAD Fam  hx Breast CA Erythrodermatitis  father died age 71 MI mother died age 19.  History breast cancer two aunts with the breast cancer one aunt with ovarian cancer  Two half-brothers died of MIs one brother history of coronary artery disease  One sister deceased, unclear causes  Social History: Reviewed history from 01/24/2008 and no changes required. Married Never Smoked  Review of Systems       The patient complains of peripheral edema.  The patient denies anorexia, fever, weight loss, weight gain, vision loss, decreased hearing, hoarseness, chest pain, syncope, dyspnea on exertion, prolonged cough, headaches, hemoptysis, abdominal pain,  melena, hematochezia, severe indigestion/heartburn, hematuria, incontinence, genital sores, muscle weakness, suspicious skin lesions, transient blindness, difficulty walking, depression, unusual weight change, abnormal bleeding, enlarged lymph nodes, angioedema, and breast masses.    Physical Exam  General:  overweight-appearing.  normal blood pressureoverweight-appearing.   Head:  Normocephalic and atraumatic without obvious abnormalities. No apparent alopecia or balding. Eyes:  No corneal or conjunctival inflammation noted. EOMI. Perrla. Funduscopic exam benign, without hemorrhages, exudates or papilledema. Vision grossly normal.  arcus senilis Ears:  External ear exam shows no significant lesions or deformities.  Otoscopic examination reveals clear canals, tympanic membranes are intact bilaterally without bulging, retraction, inflammation or discharge. hearing aid present on the left Nose:  External nasal examination shows no deformity or inflammation. Nasal mucosa are pink and moist without lesions or exudates. Mouth:  Oral mucosa and oropharynx without lesions or exudates.  Teeth in good repair. Neck:  No deformities, masses, or tenderness noted. Chest Wall:  No deformities, masses, or tenderness noted. Breasts:  No mass, nodules, thickening, tenderness, bulging, retraction, inflamation, nipple discharge or skin changes noted.   Lungs:  Normal respiratory effort, chest expands symmetrically. Lungs are clear to auscultation, no crackles or wheezes. Heart:  Normal rate and regular rhythm. S1 and S2 normal without gallop, murmur, click, rub or other extra sounds. Abdomen:  Bowel sounds positive,abdomen soft and non-tender without masses, organomegaly or hernias noted. Rectal:  No external abnormalities noted. Normal sphincter tone. No rectal masses or tenderness. Genitalia:  atrophic changes only Msk:  No deformity or scoliosis noted of thoracic or lumbar spine.   Pulses:  dorsalis pedis pulses  are full Extremities:  1+ left pedal edema and 1+ right pedal edema.  1+ left pedal edema.   Neurologic:  alert & oriented X3, cranial nerves II-XII intact, strength normal in all extremities, sensation intact to light touch, and gait normal.  alert & oriented X3, cranial nerves II-XII intact, strength normal in all extremities, sensation intact to light touch, and gait normal.   Skin:  Intact without suspicious lesions or rashes Cervical Nodes:  No lymphadenopathy noted Axillary Nodes:  No palpable lymphadenopathy Inguinal Nodes:  No significant adenopathy Psych:  Cognition and judgment appear intact. Alert and cooperative with normal attention span and concentration. No apparent delusions, illusions, hallucinations   Impression & Recommendations:  Problem # 1:  HEALTH MAINTENANCE EXAM (ICD-V70.0)  Orders: EKG w/ Interpretation (93000) Medicare -1st Annual Wellness Visit [phone]) Venipuncture (91478) TLB-BMP (Basic Metabolic Panel-BMET) (80048-METABOL) TLB-CBC Platelet - w/Differential (85025-CBCD) TLB-Hepatic/Liver Function Pnl (80076-HEPATIC) TLB-TSH (Thyroid Stimulating Hormone) (84443-TSH) Specimen Handling (29562)  Problem # 2:  SENILE OSTEOPOROSIS (ICD-733.01)  Her updated medication list for this problem includes:    Reclast 5 Mg/158ml Soln (Zoledronic acid)  Her updated medication list for this problem includes:    Reclast 5 Mg/165ml Soln (Zoledronic acid)  Problem # 3:  BREAST CANCER, HX OF (ICD-V10.3)  Problem # 4:  ALLERGIC RHINITIS (ICD-477.9)  Her updated medication list for this problem includes:    Fexofenadine Hcl 180 Mg Tabs (Fexofenadine hcl) ..... One daily as needed    Fluticasone Propionate 50 Mcg/act Susp (Fluticasone propionate) ..... Use daily as needed  Her updated medication list for this problem includes:    Fexofenadine Hcl 180 Mg Tabs (Fexofenadine hcl) ..... One daily as needed    Fluticasone Propionate 50 Mcg/act Susp (Fluticasone propionate)  ..... Use daily as needed  Complete Medication List: 1)  Tamoxifen Citrate 10 Mg Tabs (Tamoxifen citrate) .Marland Kitchen.. 1 once daily 2)  Reclast 5 Mg/154ml Soln (Zoledronic acid) 3)  Fexofenadine Hcl 180 Mg Tabs (Fexofenadine hcl) .... One daily as needed 4)  Fluticasone Propionate 50 Mcg/act Susp (Fluticasone propionate) .... Use daily as needed 5)  Furosemide 40 Mg Tabs (Furosemide) .... One daily 6)  Nystatin-triamcinolone 100000-0.1 Unit/gm-% Crea (Nystatin-triamcinolone) .... Use twice daily  Other Orders: Flu Vaccine 45yrs + MEDICARE PATIENTS (Z6109) Administration Flu vaccine - MCR (U0454)  Patient Instructions: 1)  Please schedule a follow-up appointment in 1 year. 2)  Limit your Sodium (Salt) to less than 2 grams a day(slightly less than 1/2 a teaspoon) to prevent fluid retention, swelling, or worsening of symptoms. 3)  It is important that you exercise regularly at least 20 minutes 5 times a week. If you develop chest pain, have severe difficulty breathing, or feel very tired , stop exercising immediately and seek medical attention. 4)  You need to lose weight. Consider a lower calorie diet and regular exercise.  5)  Take calcium +Vitamin D daily. Prescriptions: FUROSEMIDE 40 MG TABS (FUROSEMIDE) one daily  #90 x 6   Entered and Authorized by:   [name]  MD   Signed by:   [name]  MD on 04/14/2010   Method used:   Electronically to        [facility] [phone][facility] (retail)       [address]       [place], [place]  [address]       Ph: [phone] or [phone]       Fax: [phone]   RxID:   [id number] FLUTICASONE PROPIONATE 50 MCG/ACT  SUSP (FLUTICASONE PROPIONATE) use daily as needed  #3 x 6   Entered and Authorized by:   [name]  MD   Signed by:   [name]  MD on 04/14/2010   Method used:   Electronically to        [facility] [phone]* (retail)       [address]       [place], [place]  [address]       Ph: [phone]  or [phone]       Fax: [phone]   RxID:   [id number]

## 2010-08-20 NOTE — Letter (Signed)
Summary: [facility]  [facility]   Imported By: Maryln Gottron 07/16/2010 13:26:01  _____________________________________________________________________  External Attachment:    Type:   Image     Comment:   External Document

## 2010-08-20 NOTE — Progress Notes (Signed)
Summary: med refill  Phone Note Refill Request Call back at Home Phone [phone] Message from:  Patient  Refills Requested: Medication #1:  FEXOFENADINE HCL 180 MG  TABS one daily as needed pt needs refill   Initial call taken by: Heron Sabins,  July 06, 2010 12:38 PM    Prescriptions: FEXOFENADINE HCL 180 MG  TABS (FEXOFENADINE HCL) one daily as needed  #90 x 6   Entered by:   Duard Brady LPN   Authorized by:   [name]  MD   Signed by:   Duard Brady LPN on 09/81/1914   Method used:   [facility]ed to ...       [facility] [place] [phone]* (retail[facility]       [phone]       [place], [place]  [address]       Ph: [phone] or [phone]       Fax: [phone]   RxID:   [id number]  faxed to [facility]

## 2010-12-04 NOTE — Op Note (Signed)
[name], [name]                          ACCOUNT NO.:  [id number]   MEDICAL RECORD NO.:  [id number]                   PATIENT TYPE:  AMB   LOCATION:  DSC                                  FACILITY:  [facility]   PHYSICIAN:  Currie Paris, M.D.           DATE OF BIRTH:  05/03/1922   DATE OF PROCEDURE:  06/04/2002  DATE OF DISCHARGE:                                 OPERATIVE REPORT   CCS#  [id number]   PREOPERATIVE DIAGNOSIS:  Umbilical hernia.   POSTOPERATIVE DIAGNOSIS:  Umbilical hernia.   OPERATION PERFORMED:  Repair of umbilical hernia.   SURGEON:  Currie Paris, M.D.   ANESTHESIA:  MAC.   INDICATIONS FOR PROCEDURE:  The patient is a 77 year old lady with a small  recently developed umbilical hernia that she desired to have repaired.   DESCRIPTION OF PROCEDURE:  The patient was seen in the holding area and had  no further questions and was prepared for umbilical hernia repair.  She was  taken to the operating room and given some IV sedation.  The mid abdominal  area was prepped and draped as a sterile field.  Local was injected using a  combination of 1% Xylocaine with epinephrine with 0.5% Marcaine plain.  About 10 cc was used and this adequately anesthetized the area.   A curvilinear incision at the base of the umbilicus was made and some fatty  tissue protruding through the hernia was peeled off the undersurface of the  skin of the umbilicus and after a little manipulation and gentle dissection,  was able to be reduced.  I was able to grab the superior and inferior edges  of the fascial defect with a Kocher, elevate just a little bit and free the  subcutaneous tissues off the fascia so I could visualize a little bit  better.  The defect was about 1.5 cm across.   Once I had this all freed up, I took a piece of Marlex mesh, fashioned it  into a cone, placed it into the defect so that it fit underneath and then  closed the defect with four sutures of 0 Prolene  incorporating the inferior  edge of the fascia, the mesh in the superior edge of the fascia with each  suture.  These tied down easily and with basically no tension and there was  no evidence of any other hernia defect in the area and the fascia appeared  intact although given her age at 81 reasonably somewhat thin.   Everything appeared to be dry, so I closed with some 3-0 Vicryl to tack the  umbilical skin down deep so that it recreated the appearance of a innie.  Skin was closed with 4-0 Monocryl subcuticular.  Sterile dressing using a  cotton ball and some mineral oil.  The patient tolerated the procedure well.  There were no operative complications.  All counts were correct.  Currie Paris, M.D.    CJS/MEDQ  D:  06/04/2002  T:  06/04/2002  Job:  [id number]   cc:   [name], M.D. [facility]  [address] [place]  [place] [address]  Fax: 1   Laqueta Linden, M.D.  [address]  [place]  [place] [address]  Fax: [phone]

## 2010-12-04 NOTE — Op Note (Signed)
[facility]  Patient:    [name], [name]                       MRN: [id number] Proc. Date: 06/20/00 Adm. Date:  [id number] Disc. Date: [id number] Attending:  Mick Sell                           Operative Report  PREOPERATIVE DIAGNOSIS:  Cataract, left eye.  POSTOPERATIVE DIAGNOSIS:  Cataract, left eye.  OPERATION PERFORMED:  Cataract extraction with intraocular lens implant, left eye.  SURGEON:  Chucky May, M.D.  INDICATIONS FOR SURGERY:  The patient is a 77 year old female with painless progressive decrease in vision so that she has difficulty seeing for reading. On examination the patient was found to have a nuclear sclerotic and cortical cataract consistent with the decrease in visual acuity.  DESCRIPTION OF PROCEDURE:  The patient was brought to the main operating room and placed in supine position.  Anesthesia was obtained by means of topical 4% lidocaine drops with tetracaine.  The patient was then prepped and draped in the usual manner.  A lid speculum was inserted and the cornea was entered with a diamond keratome superiorly with an additional port superior temporally. OcuCoat was instilled and an anterior capsulorrhexis was performed without difficulty.  The nucleus was mobilized by hydrodissection, phacoemulsified, and residual cortical material removed by irrigation and aspiration.  The posterior capsule was polished and a posterior chamber lens implant was placed in the bag without difficulty.  OcuCoat was removed and replaced with balanced salt solution.  The wound was hydrated with balanced salt solution and checked for fluid leaks but none were noted.  The eye was dressed with topical Pred Forte, Ocuflox, Voltaren and a Fox shield and the patient was taken to the recovery room in excellent condition where she received written and verbal instructions for her postoperative care and was scheduled for follow up in  24 hours. DD:  07/07/00 TD:  07/07/00 Job: [id number]

## 2010-12-04 NOTE — Op Note (Signed)
[name], GASPARI NO.:  [id number]   MEDICAL RECORD NO.:  [id number]          PATIENT TYPE:  AMB   LOCATION:  DAY                          FACILITY:  [facility]   PHYSICIAN:  Ollen Gross, M.D.    DATE OF BIRTH:  1921/08/30   DATE OF PROCEDURE:  06/06/2006  DATE OF DISCHARGE:                                 OPERATIVE REPORT   PREOPERATIVE DIAGNOSIS:  Left knee medial meniscal tear.   POSTOP DIAGNOSIS:  Left knee medial meniscal tear.  Plus chondral defect  medial femoral condyle.   PROCEDURE:  Left knee arthroscopy with meniscal debridement chondroplasty.   SURGEON:  Dr. Lequita Halt. No assistant.   ANESTHESIA:  Attempted local MAC and then general.   ESTIMATED BLOOD LOSS:  Minimal.   DRAINS:  None.   COMPLICATIONS:  None.   CONDITION.:  Stable to recovery.   CLINICAL NOTE:  Ms. Asa is an 77 year old female with several month  history of progressively worsening left knee pain and mechanical symptoms.  She has a medial meniscal tear and also had stress reaction initially.  She  had sufficient time for stress reaction to get better, but now the  mechanical symptoms and pain are persisting.  She presents now for  arthroscopy and debridement.   PROCEDURE IN DETAIL:  After attempted administration of local MAC  anesthetic, tourniquet placed on the left thigh and left lower extremity  prepped and draped in usual sterile fashion.  I attempted a standard  superomedial inflow incision and she did not tolerate that well she was  converted to general via LMA.  Superomedial inferolateral incisions made and  the inflow cannula then passed superomedial and camera passed inferolateral.  Arthroscopic visualization proceeds.  Undersurface of patella looks fairly  normal as does the trochlea.  It was at most grade 1 chondromalacia.  Medial  lateral gutters were visualized and no loose bodies.  Flexion and valgus  force applied and there was grade 2 chondromalacia  medial femoral condyle as  well as a tear in the body and posterior horn of the medial meniscus.  Spinal needles used to localize the inferomedial portal. Small incision  made, dilator placed and the meniscus was debrided back to stable base with  baskets and a 4.2 mm shaver.  The edges were sealed off with the ArthroCare  device.  The unstable cartilage on the surface of medial femoral condyle was  debrided back to stable cartilaginous base.  No areas of exposed bone.  Condylar notch was then visualized, ACL looks normal.  Lateral compartment  centered looks normal overall with the exception of some fraying of the free  edge of lateral meniscus.  There was not a true tear but just fraying and I  debrided that back to a stable base with the shaver.  The patellofemoral  joints again visualized and looks normal.  Arthroscopic equipment was then  removed from the  inferior portals which were closed with interrupted 4-0 nylon.  20 mL of  0.24% Marcaine with epi injected through the inflow cannula then that is  removed and that portal  closed with nylon.  Bulky sterile dressing was  applied and she is awakened and transported to recovery in stable condition.      Ollen Gross, M.D.  Electronically Signed     FA/MEDQ  D:  06/06/2006  T:  06/06/2006  Job:  [id number]

## 2010-12-04 NOTE — Op Note (Signed)
[name], [name]                ACCOUNT NO.:  [id number]   MEDICAL RECORD NO.:  [id number]          PATIENT TYPE:  AMB   LOCATION:  DSC                          FACILITY:  [facility]   PHYSICIAN:  Currie Paris, M.D.DATE OF BIRTH:  Nov 20, 1921   DATE OF PROCEDURE:  03/10/2006  DATE OF DISCHARGE:                                 OPERATIVE REPORT   CCS [phone]   PREOPERATIVE DIAGNOSES:  1. Right breast cancer, lower inner quadrant.  2. Left breast cancer, lower outer quadrant.   POSTOPERATIVE DIAGNOSES:  1. Right breast cancer, lower inner quadrant.  2. Left breast cancer, lower outer quadrant.   OPERATION:  Needle guided excision of bilateral breast cancers.   SURGEON:  Currie Paris, M.D.   ANESTHESIA:  General.   CLINICAL HISTORY:  [name] is an 77 year old whose mammogram recently  done showed an abnormality in the left breast.  A core biopsy showed cancer.  Further evaluation with MRI showed a lesion in the right breast not seen on  original mammography.  Biopsy of this area showed also invasive cancer.  Both appeared quite small.  After discussion with the patient and  presentation to the breast conference, we elected to proceed to needle  guided excision of both breast cancers.  At this point no sentinel nodes  were contemplated being done.   DESCRIPTION OF PROCEDURE:  The patient was seen in the holding area and she  had no further questions.  She confirmed bilateral lumpectomies was the  planned procedure.  Both guidewires had been placed and I reviewed the films  with the radiologist.  The clip placed in the right breast had migrated, but  the guidewire was placed appropriately.   The patient was then taken to the operating room after satisfactory general  anesthesia had been obtained the breasts were prepped and draped.  The time-  out occurred.   I started on the right side.  I made a radial incision because this was the  general direction of the  guidewire and made the incision elliptical so I  could take out the guidewire and a little skin and the guidewire entered  through what appeared to be the prior biopsy site so we included that with  the excision.  Using cautery I took excision around the guidewire going  basically deep superior and both medial and lateral until I was well beyond  the guidewire.  There was some nodular density in the tissue and I thought  we had adequate margins around it.  This was sent initially for specimen  mammography, followed by touch preps.   Once everything appeared to be dry I put some Marcaine in to help with  postop analgesia and closed with some 3-0 Vicryl and finally 4-0 Monocryl  subcuticular.  Attention was turned to the left side.  That had a  curvilinear incision was seemed to be the most cosmetic and also direct the  guidewire and a likewise did an excision around the guidewire.  In doing so  there was what appeared to be little fat necrosis or possibly  the tumor the  biopsy site was very close to my medial margin.  Once the area was out I  sent this for specimen myography.  I had already identified the area where I  thought we were close and I took additional margins from this.  This  incision was likewise closed with 3-0 Vicryl and 4-0 Monocryl subcuticular.   For each side radiology reported first that the specimen mammogram showed  that the tumor was in the specimen and pathology reported that the touch  preps were negative.  Once all these reports back Dermabond was applied to  the incisions.  The patient was awakened and taken to the recovery room.  She tolerated the procedure well.  There no operative complications.  All  counts were correct.      Currie Paris, M.D.  Electronically Signed     CJS/MEDQ  D:  03/10/2006  T:  03/11/2006  Job:  [id number]   cc:   [name], MD

## 2011-02-08 ENCOUNTER — Ambulatory Visit (INDEPENDENT_AMBULATORY_CARE_PROVIDER_SITE_OTHER): Payer: Medicare Other | Admitting: Internal Medicine

## 2011-02-08 DIAGNOSIS — Z0000 Encounter for general adult medical examination without abnormal findings: Secondary | ICD-10-CM

## 2011-02-08 DIAGNOSIS — Z23 Encounter for immunization: Secondary | ICD-10-CM

## 2011-02-10 ENCOUNTER — Encounter: Payer: Self-pay | Admitting: Podiatry

## 2011-04-16 ENCOUNTER — Encounter: Payer: Self-pay | Admitting: Internal Medicine

## 2011-04-19 ENCOUNTER — Encounter: Payer: Self-pay | Admitting: Internal Medicine

## 2011-04-19 ENCOUNTER — Ambulatory Visit (INDEPENDENT_AMBULATORY_CARE_PROVIDER_SITE_OTHER): Payer: Medicare Other | Admitting: Internal Medicine

## 2011-04-19 VITALS — BP 122/80 | HR 72 | Temp 98.3°F | Resp 16 | Ht 63.0 in | Wt 181.0 lb

## 2011-04-19 DIAGNOSIS — E041 Nontoxic single thyroid nodule: Secondary | ICD-10-CM

## 2011-04-19 DIAGNOSIS — M810 Age-related osteoporosis without current pathological fracture: Secondary | ICD-10-CM

## 2011-04-19 DIAGNOSIS — Z23 Encounter for immunization: Secondary | ICD-10-CM

## 2011-04-19 DIAGNOSIS — M1990 Unspecified osteoarthritis, unspecified site: Secondary | ICD-10-CM

## 2011-04-19 DIAGNOSIS — Z0000 Encounter for general adult medical examination without abnormal findings: Secondary | ICD-10-CM

## 2011-04-19 DIAGNOSIS — Z136 Encounter for screening for cardiovascular disorders: Secondary | ICD-10-CM

## 2011-04-19 DIAGNOSIS — Z79899 Other long term (current) drug therapy: Secondary | ICD-10-CM

## 2011-04-19 DIAGNOSIS — Z853 Personal history of malignant neoplasm of breast: Secondary | ICD-10-CM

## 2011-04-19 DIAGNOSIS — J309 Allergic rhinitis, unspecified: Secondary | ICD-10-CM

## 2011-04-19 DIAGNOSIS — I872 Venous insufficiency (chronic) (peripheral): Secondary | ICD-10-CM

## 2011-04-19 LAB — LIPID PANEL
HDL: 62.1 mg/dL (ref >39.00)
Total CHOL/HDL Ratio: 3
Triglycerides: 188 mg/dL — ABNORMAL HIGH (ref 0.0–149.0)

## 2011-04-19 LAB — CBC WITH DIFFERENTIAL/PLATELET
Basophils Absolute: 0 10*3/uL (ref 0.0–0.1)
Eosinophils Relative: 0.7 % (ref 0.0–5.0)
HCT: 36.4 % (ref 36.0–46.0)
Lymphs Abs: 1.5 10*3/uL (ref 0.7–4.0)
MCV: 93.9 fl (ref 78.0–100.0)
Monocytes Absolute: 0.4 10*3/uL (ref 0.1–1.0)
Platelets: 218 10*3/uL (ref 150.0–400.0)
RDW: 13.8 % (ref 11.5–14.6)

## 2011-04-19 LAB — COMPREHENSIVE METABOLIC PANEL
ALT: 15 U/L (ref 0–35)
Alkaline Phosphatase: 51 U/L (ref 39–117)
Sodium: 145 mEq/L (ref 135–145)
Total Bilirubin: 0.3 mg/dL (ref 0.3–1.2)
Total Protein: 6.8 g/dL (ref 6.0–8.3)

## 2011-04-19 LAB — LDL CHOLESTEROL, DIRECT: Direct LDL: 119 mg/dL

## 2011-04-19 MED ORDER — FLUTICASONE PROPIONATE 50 MCG/ACT NA SUSP: 2.0000 | Freq: Every day | NASAL | Status: DC

## 2011-04-19 MED ORDER — FUROSEMIDE 40 MG PO TABS: 40.0000 mg | ORAL_TABLET | Freq: Every day | ORAL | Status: DC

## 2011-04-19 NOTE — Progress Notes (Signed)
Subjective:    Patient ID: [name], female    DOB: 1921-08-30, 77 y.o.   MRN: [id number]  HPI CC: cpx - doing well.   History of Present Illness:   77 year-old patient who is seen today for a wellness exam. She has a history of breast cancer, and senile osteoporosis, and is followed by oncology annually. She is doing quite well. Today. She has a history of osteoarthritis, allergic rhinitis , mild chronic venous insufficiency. She has done quite well and denies any cardiopulmonary complaints. She is scheduled for oncology followup next month. She has been on reclast  for 2 or 3 years although this was held last year.  Here for Medicare AWV:   1. Risk factors based on Past M, S, F history: Ms. factors include aging. Denies any cardiopulmonary complaints  2. Physical Activities: active with aspects of daily living  3. Depression/mood: history depression, or mood disorder  4. Hearing: was a hearing aid on the left  5. ADL's: independent in all aspects of daily living  6. Fall Risk: moderate due to age  43. Home Safety: no problems identified  8. Height, weight, &visual acuity:height and weight stable. No change in visual acuity does receive annual reclast injections  9. Counseling: oncology follow-up recommended  10. Labs ordered based on risk factors: laboratory panel will be reviewed  11. Referral Coordination- oncology referral  12. Care Plan- heart healthy diet more regular exercise. Encouraged  13. Cognitive Assessment- alert and oriented normal affect. No memory disturbance. Able to handle all executive functioning without difficulty   Preventive Screening-Counseling & Management  Alcohol-Tobacco  Smoking Status: never   Allergies:   1) ! Celebrex   Past History:  Past Medical History:  Allergic rhinitis,seasonal  Osteoarthritis  Menopausal syndrome  Breast cancer, hx of bilateral August 2007  history of squamous cell skin cancer right lower leg  chronic venous  insufficiency  Osteoporosis  Allergic rhinitis  Goiter/ R thyroid nodule   Past Surgical History:  Lt eardrum sx-1983  Cataract extraction  Tonsillectomy  D&C  Umbilical hernia repair  bilateral lumpectomies for breast cancer in August 2007  left knee arthroscopic surgery  history of lumbar compression fracture  status post resection squamous cell cancer right lower leg  no screening colonoscopy  s/p bilat lumpectomies   Family History:  Reviewed history from 01/24/2008 and no changes required.  Fam hx CAD  Fam hx Breast CA  Erythrodermatitis  father died age 52 MI  mother died age 78. History breast cancer  two aunts with the breast cancer  one aunt with ovarian cancer  Two half-brothers died of MIs  one brother history of coronary artery disease  One sister deceased, unclear causes   Social History:  Reviewed history from 01/24/2008 and no changes required.  Married  Never Smoked  Will be moving into [facility] in November 2012  Review of Systems  Constitutional: Negative for fever, appetite change, fatigue and unexpected weight change.  HENT: Positive for hearing loss. Negative for ear pain, nosebleeds, congestion, sore throat, mouth sores, trouble swallowing, neck stiffness, dental problem, voice change, sinus pressure and tinnitus.   Eyes: Negative for photophobia, pain, redness and visual disturbance.  Respiratory: Negative for cough, chest tightness and shortness of breath.   Cardiovascular: Negative for chest pain, palpitations and leg swelling.  Gastrointestinal: Negative for nausea, vomiting, abdominal pain, diarrhea, constipation, blood in stool, abdominal distention and rectal pain.       Occasional episodes of  solid food dysphagia controlled with smaller bites and has been nonprogressive over the past year  Genitourinary: Negative for dysuria, urgency, frequency, hematuria, flank pain, vaginal bleeding, vaginal discharge, difficulty urinating, genital  sores, vaginal pain, menstrual problem and pelvic pain.  Musculoskeletal: Negative for back pain and arthralgias.  Skin: Negative for rash.  Neurological: Negative for dizziness, syncope, speech difficulty, weakness, light-headedness, numbness and headaches.  Hematological: Negative for adenopathy. Does not bruise/bleed easily.  Psychiatric/Behavioral: Negative for suicidal ideas, behavioral problems, self-injury, dysphoric mood and agitation. The patient is not nervous/anxious.        Objective:   Physical Exam  Constitutional: She is oriented to person, place, and time. She appears well-developed and well-nourished.  HENT:  Head: Normocephalic and atraumatic.  Right Ear: External ear normal.  Left Ear: External ear normal.  Mouth/Throat: Oropharynx is clear and moist.       Left tympanic membrane was scarred and perforated  Eyes: Conjunctivae and EOM are normal.  Neck: Normal range of motion. Neck supple. No JVD present. No thyromegaly present.  Cardiovascular: Normal rate, regular rhythm and intact distal pulses.   Murmur heard.      Grade 3/6 high-pitched systolic murmur Dorsalis pedis pulses were intact posterior tibial pulses not easily palpable  Pulmonary/Chest: Effort normal and breath sounds normal. She has no wheezes. She has no rales.  Abdominal: Soft. Bowel sounds are normal. She exhibits no distension and no mass. There is no tenderness. There is no rebound and no guarding.  Musculoskeletal: Normal range of motion. She exhibits edema. She exhibits no tenderness.       Mild left ankle edema and chronic  Neurological: She is alert and oriented to person, place, and time. She has normal reflexes. No cranial nerve deficit. She exhibits normal muscle tone. Coordination normal.  Skin: Skin is warm and dry. No rash noted.  Psychiatric: She has a normal mood and affect. Her behavior is normal.          Assessment & Plan:   Preventive health examination Senile  osteoporosis. We'll consider further treatment with oncology next month. We'll continue calcium and vitamin D supplements History breast cancer. Follow up in oncology will check screening labs today and Osteoarthritis stable Allergic rhinitis

## 2011-04-19 NOTE — Patient Instructions (Signed)
Limit your sodium (Salt) intake  Take a calcium supplement, plus [phone] units of vitamin D    It is important that you exercise regularly, at least 20 minutes 3 to 4 times per week.  If you develop chest pain or shortness of breath seek  medical attention.  Followup oncology  Return in one year for follow-up

## 2011-05-08 ENCOUNTER — Other Ambulatory Visit: Payer: Self-pay | Admitting: Physician Assistant

## 2011-05-20 ENCOUNTER — Other Ambulatory Visit: Payer: Self-pay | Admitting: Oncology

## 2011-05-20 ENCOUNTER — Encounter (HOSPITAL_BASED_OUTPATIENT_CLINIC_OR_DEPARTMENT_OTHER): Payer: Medicare Other | Admitting: Oncology

## 2011-05-20 DIAGNOSIS — C50919 Malignant neoplasm of unspecified site of unspecified female breast: Secondary | ICD-10-CM

## 2011-05-20 DIAGNOSIS — Z170 Estrogen receptor positive status [ER+]: Secondary | ICD-10-CM

## 2011-05-20 DIAGNOSIS — M899 Disorder of bone, unspecified: Secondary | ICD-10-CM

## 2011-05-20 LAB — COMPREHENSIVE METABOLIC PANEL
Albumin: 3.9 g/dL (ref 3.5–5.2)
BUN: 21 mg/dL (ref 6–23)
Calcium: 9.8 mg/dL (ref 8.4–10.5)
Chloride: 108 mEq/L (ref 96–112)
Glucose, Bld: 185 mg/dL — ABNORMAL HIGH (ref 70–99)
Potassium: 4.2 mEq/L (ref 3.5–5.3)
Sodium: 145 mEq/L (ref 135–145)
Total Protein: 6.4 g/dL (ref 6.0–8.3)

## 2011-05-20 LAB — CBC WITH DIFFERENTIAL/PLATELET
Basophils Absolute: 0 10*3/uL (ref 0.0–0.1)
Eosinophils Absolute: 0 10*3/uL (ref 0.0–0.5)
HGB: 12.3 g/dL (ref 11.6–15.9)
MCV: 93.4 fL (ref 79.5–101.0)
MONO#: 0.3 10*3/uL (ref 0.1–0.9)
NEUT#: 6.2 10*3/uL (ref 1.5–6.5)
RBC: 3.86 10*6/uL (ref 3.70–5.45)
RDW: 13.6 % (ref 11.2–14.5)
WBC: 7.8 10*3/uL (ref 3.9–10.3)
lymph#: 1.2 10*3/uL (ref 0.9–3.3)

## 2011-05-27 ENCOUNTER — Telehealth: Payer: Self-pay | Admitting: *Deleted

## 2011-05-27 NOTE — Telephone Encounter (Signed)
PATIENT CAME ON 05-27-2011. PATIENT DID NOT RECEIVIE THE LETTER PER THE [employer] THE [employer] GAVE PATIENT APPOINTMENT FOR 06-2011 WITH DR.MAGRINAT PRINTED OUT INITINARY AND GAVE TO THE PATIENT.

## 2011-06-08 ENCOUNTER — Other Ambulatory Visit: Payer: Self-pay | Admitting: *Deleted

## 2011-06-08 DIAGNOSIS — C50919 Malignant neoplasm of unspecified site of unspecified female breast: Secondary | ICD-10-CM

## 2011-06-08 MED ORDER — TAMOXIFEN CITRATE 20 MG PO TABS: 20.0000 mg | ORAL_TABLET | Freq: Every day | ORAL | Status: DC

## 2011-06-22 ENCOUNTER — Ambulatory Visit (HOSPITAL_BASED_OUTPATIENT_CLINIC_OR_DEPARTMENT_OTHER): Payer: Medicare Other | Admitting: Oncology

## 2011-06-22 ENCOUNTER — Telehealth: Payer: Self-pay | Admitting: *Deleted

## 2011-06-22 VITALS — BP 142/65 | HR 96 | Temp 98.3°F | Ht 62.5 in | Wt 178.1 lb

## 2011-06-22 DIAGNOSIS — C50919 Malignant neoplasm of unspecified site of unspecified female breast: Secondary | ICD-10-CM

## 2011-06-22 DIAGNOSIS — R1310 Dysphagia, unspecified: Secondary | ICD-10-CM

## 2011-06-22 NOTE — Telephone Encounter (Signed)
left voice message to inform the patient of the appointment with dr.marc magod on 07-08-2011 at 10:00am

## 2011-06-22 NOTE — Progress Notes (Signed)
ID: [name]   Interval History: [name] returns today for followup of her breast cancer. The interval history is significant for her having moved with her [facility] of more than [facility]. She is really enjoying it over there. They had some arguments about her keeping "too much stuff" but as she puts it "he is 77 and he is not going to leave me anytime soon".  ROS:  Her review of systems is generally unremarkable. Occasionally she feels chilly. She doesn't hear as well as she did. She has some sinus issues. The one problem of more concern is difficulty swallowing. This is mostly to solids. It is not associated with odynophagia. Sometimes food just gets stuck and she has to go somewhere and bring it back up. She wonders if her taking bisphosphonates for several years irritated her esophagus and off that it narrowed. That is certainly possible about other possibilities exist as well and this probably should be evaluated further.  Medications: I have reviewed the patient's current medications.  Current Outpatient Prescriptions  Medication Sig Dispense Refill  . Ferrous Sulfate (SLOW FE PO) Take by mouth daily.        . fexofenadine (ALLEGRA) 180 MG tablet Take 180 mg by mouth daily.        . fluticasone (FLONASE) 50 MCG/ACT nasal spray Place 2 sprays into the nose daily.  16 g  6  . furosemide (LASIX) 40 MG tablet Take 1 tablet (40 mg total) by mouth daily.  90 tablet  5  . tamoxifen (NOLVADEX) 20 MG tablet Take 1 tablet (20 mg total) by mouth daily.  90 tablet  0  . triamcinolone (KENALOG) 0.1 % cream Apply 1 application topically 2 (two) times daily.        . zoledronic acid (RECLAST) 5 MG/100ML SOLN Inject 5 mg into the vein once.           Objective:  Filed Vitals:   06/22/11 1527  BP: 142/65  Pulse: 96  Temp: 98.3 F (36.8 C)    Physical Exam:    Sclerae unicteric  Oropharynx clear  No peripheral adenopathy  Lungs clear -- no rales or rhonchi  Heart regular  rate and rhythm  Abdomen benign  MSK no focal spinal tenderness, no peripheral edema  Neuro nonfocal  Breast exam: She is status post bilateral lumpectomies. There is no evidence of recurrence in either breast.  Lab Results:  CMP      Component Value Date/Time   NA 145 05/20/2011 1335   NA 145 05/20/2011 1335   K 4.2 05/20/2011 1335   K 4.2 05/20/2011 1335   CL 108 05/20/2011 1335   CL 108 05/20/2011 1335   CO2 20 05/20/2011 1335   CO2 20 05/20/2011 1335   GLUCOSE 185* 05/20/2011 1335   GLUCOSE 185* 05/20/2011 1335   BUN 21 05/20/2011 1335   BUN 21 05/20/2011 1335   CREATININE 1.12* 05/20/2011 1335   CREATININE 1.12* 05/20/2011 1335   CALCIUM 9.8 05/20/2011 1335   CALCIUM 9.8 05/20/2011 1335   PROT 6.4 05/20/2011 1335   PROT 6.4 05/20/2011 1335   ALBUMIN 3.9 05/20/2011 1335   ALBUMIN 3.9 05/20/2011 1335   AST 16 05/20/2011 1335   AST 16 05/20/2011 1335   ALT 13 05/20/2011 1335   ALT 13 05/20/2011 1335   ALKPHOS 52 05/20/2011 1335   ALKPHOS 52 05/20/2011 1335   BILITOT 0.3 05/20/2011 1335   BILITOT 0.3 05/20/2011 1335   GFRNONAA  50.02 04/14/2010 1043    CBC Lab Results  Component Value Date   WBC 7.8 05/20/2011   HGB 12.3 05/20/2011   HCT 36.0 05/20/2011   MCV 93.4 05/20/2011   PLT 211 05/20/2011   NEUTROABS 6.2 05/20/2011    Studies/Results:  Mammography at [facility] January of this year was unremarkable. A bone density also at [facility] obtained July of this year showed a significant increase in her bone mineral density as compared to 2 years prior. The lowest T. reading was -1.7.  Assessment: 77 year old [place] woman status post bilateral lumpectomies with no sentinel lymph node biopsy August of 2007 4  (1) a right-sided 7 mm grade 2 invasive ductal carcinoma, estrogen receptor positive, HER-2 negative, with a low proliferation fraction  (2) a left sided 8 mm invasive ductal carcinoma, grade 1, also estrogen receptor positive HER-2 negative, also with a low proliferation fraction.  (3)  anastrozole between September of 2007 and May of 2009 at which time she was switched to tamoxifen. She also received zoledronic acid yearly for total of 4 doses, last November of 2011.   Plan: At this point I am comfortable with her discontinuing tamoxifen and I am returning her to Dr. Vernon Marks care. She is very concerned about the swallowing difficulty. She tells me she couldn't eat at church today and one of her friends suggested she see Dr. Ewing Marks. I was glad to place that referral for her.  She continues on 1 iron tablet daily. This has resolved her mild anemia. I suggested she continue on that another year or so then stop.  I have not made any further appointments for [name] here. She knows that we'll be glad to see her anytime in the future if the need arises.  [name] C 06/22/2011

## 2011-07-08 ENCOUNTER — Other Ambulatory Visit (HOSPITAL_COMMUNITY): Payer: Self-pay | Admitting: Gastroenterology

## 2011-07-08 DIAGNOSIS — R4702 Dysphasia: Secondary | ICD-10-CM

## 2011-07-09 ENCOUNTER — Ambulatory Visit (HOSPITAL_COMMUNITY)
Admission: RE | Admit: 2011-07-09 | Discharge: 2011-07-09 | Disposition: A | Discharge status: Home / Self Care | Payer: Medicare Other | Source: Ambulatory Visit | Attending: Gastroenterology | Admitting: Gastroenterology

## 2011-07-09 DIAGNOSIS — R1310 Dysphagia, unspecified: Secondary | ICD-10-CM | POA: Insufficient documentation

## 2011-07-09 DIAGNOSIS — K222 Esophageal obstruction: Secondary | ICD-10-CM | POA: Insufficient documentation

## 2011-07-09 DIAGNOSIS — R4702 Dysphasia: Secondary | ICD-10-CM

## 2011-07-16 ENCOUNTER — Ambulatory Visit (HOSPITAL_COMMUNITY): Admission: RE | Admit: 2011-07-16 | Payer: Medicare Other | Source: Ambulatory Visit | Admitting: Gastroenterology

## 2011-07-16 ENCOUNTER — Encounter (HOSPITAL_COMMUNITY): Admission: RE | Payer: Self-pay | Source: Ambulatory Visit

## 2011-07-16 SURGERY — EGD (ESOPHAGOGASTRODUODENOSCOPY)
Anesthesia: Moderate Sedation

## 2011-07-26 ENCOUNTER — Encounter (HOSPITAL_COMMUNITY): Payer: Self-pay

## 2011-07-26 ENCOUNTER — Encounter (HOSPITAL_COMMUNITY)
Admission: RE | Disposition: A | Discharge status: Home / Self Care | Payer: Self-pay | Source: Ambulatory Visit | Attending: Gastroenterology

## 2011-07-26 ENCOUNTER — Ambulatory Visit (HOSPITAL_COMMUNITY): Payer: Medicare Other

## 2011-07-26 ENCOUNTER — Ambulatory Visit (HOSPITAL_COMMUNITY)
Admission: RE | Admit: 2011-07-26 | Discharge: 2011-07-26 | Disposition: A | Discharge status: Home / Self Care | Payer: Medicare Other | Source: Ambulatory Visit | Attending: Gastroenterology | Admitting: Gastroenterology

## 2011-07-26 DIAGNOSIS — R112 Nausea with vomiting, unspecified: Secondary | ICD-10-CM | POA: Diagnosis not present

## 2011-07-26 DIAGNOSIS — K449 Diaphragmatic hernia without obstruction or gangrene: Secondary | ICD-10-CM | POA: Diagnosis not present

## 2011-07-26 DIAGNOSIS — K209 Esophagitis, unspecified without bleeding: Secondary | ICD-10-CM | POA: Insufficient documentation

## 2011-07-26 DIAGNOSIS — R933 Abnormal findings on diagnostic imaging of other parts of digestive tract: Secondary | ICD-10-CM | POA: Diagnosis not present

## 2011-07-26 DIAGNOSIS — K222 Esophageal obstruction: Secondary | ICD-10-CM | POA: Insufficient documentation

## 2011-07-26 HISTORY — PX: ESOPHAGOGASTRODUODENOSCOPY: SHX5428

## 2011-07-26 HISTORY — PX: SAVORY DILATION: SHX5439

## 2011-07-26 SURGERY — EGD (ESOPHAGOGASTRODUODENOSCOPY)
Anesthesia: Moderate Sedation

## 2011-07-26 MED ORDER — MIDAZOLAM HCL 10 MG/2ML IJ SOLN
INTRAMUSCULAR | Status: DC | PRN
  Administered 2011-07-26: 1 mg via INTRAVENOUS
  Administered 2011-07-26 (×2): 2 mg via INTRAVENOUS
  Administered 2011-07-26: 1 mg via INTRAVENOUS

## 2011-07-26 MED ORDER — BUTAMBEN-TETRACAINE-BENZOCAINE 2-2-14 % EX AERO
INHALATION_SPRAY | CUTANEOUS | Status: DC | PRN
  Administered 2011-07-26: 2 via TOPICAL

## 2011-07-26 MED ORDER — FENTANYL CITRATE 0.05 MG/ML IJ SOLN
INTRAMUSCULAR | Status: AC
  Filled 2011-07-26: qty 2

## 2011-07-26 MED ORDER — MIDAZOLAM HCL 10 MG/2ML IJ SOLN
INTRAMUSCULAR | Status: AC
  Filled 2011-07-26: qty 2

## 2011-07-26 MED ORDER — FENTANYL NICU IV SYRINGE 50 MCG/ML
INJECTION | INTRAMUSCULAR | Status: DC | PRN
  Administered 2011-07-26: 25 ug via INTRAVENOUS
  Administered 2011-07-26: 10 ug via INTRAVENOUS
  Administered 2011-07-26: 15 ug via INTRAVENOUS
  Administered 2011-07-26: 10 ug via INTRAVENOUS

## 2011-07-26 NOTE — Op Note (Signed)
[facility] [address] [place], [place]  [address]  ENDOSCOPY PROCEDURE REPORT  PATIENT:  Taylor, Marks  MR#:  [id number] BIRTHDATE:  November 11, 1921, 89 yrs. old  GENDER:  female  ENDOSCOPIST:  Vida Rigger, MD Referred by:  [name], M.D.  PROCEDURE DATE:  07/26/2011 PROCEDURE:  EGD with dilatation over guidewire ASA CLASS:  Class II INDICATIONS:  dysphasia abnormal barium swallow  MEDICATIONS:  60 mcg of fentanyl 6 mg Versed TOPICAL ANESTHETIC: Used  DESCRIPTION OF PROCEDURE:   After the risks benefits and alternatives of the procedure were thoroughly explained, informed consent was obtained.  The Pentax Gastroscope [id number] endoscope was introduced through the mouth and advanced to the second portion of the duodenum The instrument was slowly withdrawn as the mucosa was fully examined. The findings were shown below. Once the endoscopy was completed the scope was advanced to the antrum and the Savary wire was advanced and the customary J. loop was confirmed both endoscopically and under fluoroscopy and while the scope was slowly withdrawn the wire was confirmed to stay in the proper position under fluoroscopy and once the scope was removed a Savary 15 mm dilator was advanced into the stomach and confirmed in the proper position under fluoroscopy. There was no heme or any resistance in passing the dilator the wire and the dilator were removed in tandem and the procedure was terminated and the patient tolerated the procedure well there was no obvious immediate complication <<PROCEDUREIMAGES>>  FINDINGS: 1. Linear mid esophagitis 2. Benign-appearing fibrous mid-esophageal ring able to pass scope without resistance 3. Moderate hiatal hernia 4. Otherwise within normal limits EGD Therapy: Savary dilatation to 15 mm under fluoroscopy guidance without resistance or bleeding  COMPLICATIONS: None  ENDOSCOPIC IMPRESSION: Above  RECOMMENDATIONS: Continue  long-term pump inhibitors a prescription of omeprazole was written call me when necessary otherwise followup in one month to recheck symptoms and make sure more aggressive dilation is not needed  REPEAT EXAM:  As needed  ______________________________ Vida Rigger, MD  CC:  [name], MD  n. Rosalie DoctorVida Rigger at 07/26/2011 12:06 PM  [name], [phone]

## 2011-07-27 ENCOUNTER — Encounter (HOSPITAL_COMMUNITY): Payer: Self-pay

## 2011-07-27 ENCOUNTER — Encounter (HOSPITAL_COMMUNITY): Payer: Self-pay | Admitting: Gastroenterology

## 2011-07-29 DIAGNOSIS — M79609 Pain in unspecified limb: Secondary | ICD-10-CM | POA: Diagnosis not present

## 2011-07-29 DIAGNOSIS — B351 Tinea unguium: Secondary | ICD-10-CM | POA: Diagnosis not present

## 2011-08-23 DIAGNOSIS — Z1231 Encounter for screening mammogram for malignant neoplasm of breast: Secondary | ICD-10-CM | POA: Diagnosis not present

## 2011-08-27 DIAGNOSIS — R1310 Dysphagia, unspecified: Secondary | ICD-10-CM | POA: Diagnosis not present

## 2011-08-30 ENCOUNTER — Encounter: Payer: Self-pay | Admitting: Internal Medicine

## 2011-10-11 DIAGNOSIS — H04129 Dry eye syndrome of unspecified lacrimal gland: Secondary | ICD-10-CM | POA: Diagnosis not present

## 2011-10-28 DIAGNOSIS — M79609 Pain in unspecified limb: Secondary | ICD-10-CM | POA: Diagnosis not present

## 2011-10-28 DIAGNOSIS — B351 Tinea unguium: Secondary | ICD-10-CM | POA: Diagnosis not present

## 2011-12-01 DIAGNOSIS — L219 Seborrheic dermatitis, unspecified: Secondary | ICD-10-CM | POA: Diagnosis not present

## 2011-12-01 DIAGNOSIS — L738 Other specified follicular disorders: Secondary | ICD-10-CM | POA: Diagnosis not present

## 2011-12-01 DIAGNOSIS — L299 Pruritus, unspecified: Secondary | ICD-10-CM | POA: Diagnosis not present

## 2012-02-02 DIAGNOSIS — L219 Seborrheic dermatitis, unspecified: Secondary | ICD-10-CM | POA: Diagnosis not present

## 2012-02-02 DIAGNOSIS — I8310 Varicose veins of unspecified lower extremity with inflammation: Secondary | ICD-10-CM | POA: Diagnosis not present

## 2012-02-02 DIAGNOSIS — D239 Other benign neoplasm of skin, unspecified: Secondary | ICD-10-CM | POA: Diagnosis not present

## 2012-02-02 DIAGNOSIS — L821 Other seborrheic keratosis: Secondary | ICD-10-CM | POA: Diagnosis not present

## 2012-02-03 DIAGNOSIS — M79609 Pain in unspecified limb: Secondary | ICD-10-CM | POA: Diagnosis not present

## 2012-02-03 DIAGNOSIS — Q828 Other specified congenital malformations of skin: Secondary | ICD-10-CM | POA: Diagnosis not present

## 2012-02-03 DIAGNOSIS — B351 Tinea unguium: Secondary | ICD-10-CM | POA: Diagnosis not present

## 2012-02-25 DIAGNOSIS — R1310 Dysphagia, unspecified: Secondary | ICD-10-CM | POA: Diagnosis not present

## 2012-04-27 DIAGNOSIS — B351 Tinea unguium: Secondary | ICD-10-CM | POA: Diagnosis not present

## 2012-04-27 DIAGNOSIS — M79609 Pain in unspecified limb: Secondary | ICD-10-CM | POA: Diagnosis not present

## 2012-05-01 ENCOUNTER — Ambulatory Visit (INDEPENDENT_AMBULATORY_CARE_PROVIDER_SITE_OTHER): Payer: MEDICARE | Admitting: Internal Medicine

## 2012-05-01 DIAGNOSIS — Z23 Encounter for immunization: Secondary | ICD-10-CM

## 2012-06-23 ENCOUNTER — Encounter: Payer: Self-pay | Admitting: Internal Medicine

## 2012-06-23 ENCOUNTER — Telehealth: Payer: Self-pay | Admitting: Internal Medicine

## 2012-06-23 ENCOUNTER — Ambulatory Visit (INDEPENDENT_AMBULATORY_CARE_PROVIDER_SITE_OTHER): Payer: MEDICARE | Admitting: Internal Medicine

## 2012-06-23 VITALS — BP 140/68 | HR 80 | Temp 98.0°F | Resp 18 | Ht 62.25 in | Wt 180.0 lb

## 2012-06-23 DIAGNOSIS — I872 Venous insufficiency (chronic) (peripheral): Secondary | ICD-10-CM

## 2012-06-23 DIAGNOSIS — Z0000 Encounter for general adult medical examination without abnormal findings: Secondary | ICD-10-CM

## 2012-06-23 DIAGNOSIS — M810 Age-related osteoporosis without current pathological fracture: Secondary | ICD-10-CM | POA: Diagnosis not present

## 2012-06-23 DIAGNOSIS — M1990 Unspecified osteoarthritis, unspecified site: Secondary | ICD-10-CM | POA: Diagnosis not present

## 2012-06-23 DIAGNOSIS — E042 Nontoxic multinodular goiter: Secondary | ICD-10-CM

## 2012-06-23 LAB — COMPREHENSIVE METABOLIC PANEL
Alkaline Phosphatase: 86 U/L (ref 39–117)
BUN: 22 mg/dL (ref 6–23)
CO2: 29 mEq/L (ref 19–32)
Creatinine, Ser: 1.2 mg/dL (ref 0.4–1.2)
GFR: 44.58 mL/min — ABNORMAL LOW (ref >60.00)
Glucose, Bld: 116 mg/dL — ABNORMAL HIGH (ref 70–99)
Sodium: 141 mEq/L (ref 135–145)
Total Bilirubin: 0.5 mg/dL (ref 0.3–1.2)

## 2012-06-23 LAB — CBC WITH DIFFERENTIAL/PLATELET
Basophils Relative: 0.7 % (ref 0.0–3.0)
Eosinophils Relative: 0.8 % (ref 0.0–5.0)
HCT: 35.8 % — ABNORMAL LOW (ref 36.0–46.0)
Hemoglobin: 11.9 g/dL — ABNORMAL LOW (ref 12.0–15.0)
Lymphs Abs: 1.1 10*3/uL (ref 0.7–4.0)
MCV: 92.9 fl (ref 78.0–100.0)
Monocytes Absolute: 0.3 10*3/uL (ref 0.1–1.0)
Monocytes Relative: 5.9 % (ref 3.0–12.0)
Platelets: 197 10*3/uL (ref 150.0–400.0)
RBC: 3.86 Mil/uL — ABNORMAL LOW (ref 3.87–5.11)
WBC: 5.3 10*3/uL (ref 4.5–10.5)

## 2012-06-23 MED ORDER — FEXOFENADINE HCL 180 MG PO TABS: 180.0000 mg | ORAL_TABLET | Freq: Every day | ORAL | Status: DC

## 2012-06-23 MED ORDER — OMEPRAZOLE 40 MG PO CPDR: 40.0000 mg | DELAYED_RELEASE_CAPSULE | Freq: Every day | ORAL | Status: DC

## 2012-06-23 MED ORDER — FUROSEMIDE 40 MG PO TABS: 40.0000 mg | ORAL_TABLET | Freq: Every day | ORAL | Status: DC

## 2012-06-23 MED ORDER — FLUTICASONE PROPIONATE 50 MCG/ACT NA SUSP: 2.0000 | Freq: Every day | NASAL | Status: DC

## 2012-06-23 NOTE — Telephone Encounter (Signed)
Tamoxifen removed from med list.

## 2012-06-23 NOTE — Telephone Encounter (Signed)
Pt was in for ov 06/23/12 and said that she no longer takes the tamoxifen (NOLVADEX) 20 MG tablet  And needs it removed for her med list. Pt says that she hasn't taken this med in over a year.

## 2012-06-23 NOTE — Progress Notes (Signed)
Patient ID: [name], female   DOB: [birth year], 78 y.o.   MRN: [id number]  Subjective:    Patient ID: [name], female    DOB: 1922-04-20, 78 y.o.   MRN: [id number]  HPI CC: cpx - doing well.   History of Present Illness:   78 year-old patient who is seen today for a wellness exam. She has a history of breast cancer, and senile osteoporosis, and is followed by oncology annually. She is doing quite well. Today. She has a history of osteoarthritis, allergic rhinitis , mild chronic venous insufficiency. She has done quite well and denies any cardiopulmonary complaints.   Here for Medicare AWV:   1. Risk factors based on Past M, S, F history: Ms. factors include aging. Denies any cardiopulmonary complaints  2. Physical Activities: active with aspects of daily living  3. Depression/mood: history depression, or mood disorder  4. Hearing: was a hearing aid on the left  5. ADL's: independent in all aspects of daily living  6. Fall Risk: moderate due to age  61. Home Safety: no problems identified  8. Height, weight, &visual acuity:height and weight stable. No change in visual acuity does receive annual reclast injections  9. Counseling: oncology follow-up recommended  10. Labs ordered based on risk factors: laboratory panel will be reviewed  11. Referral Coordination- oncology referral  12. Care Plan- heart healthy diet more regular exercise. Encouraged  13. Cognitive Assessment- alert and oriented normal affect. No memory disturbance. Able to handle all executive functioning without difficulty   Preventive Screening-Counseling & Management  Alcohol-Tobacco  Smoking Status: never   Allergies:   1) ! Celebrex   Past History:  Past Medical History:  Allergic rhinitis,seasonal  Osteoarthritis  Menopausal syndrome  Breast cancer, hx of bilateral August 2007  history of squamous cell skin cancer right lower leg  chronic venous insufficiency  Osteoporosis  Allergic rhinitis   Goiter/ R thyroid nodule   Past Surgical History:  Lt eardrum sx-1983  Cataract extraction  Tonsillectomy  D&C  Umbilical hernia repair  bilateral lumpectomies for breast cancer in August 2007  left knee arthroscopic surgery  history of lumbar compression fracture  status post resection squamous cell cancer right lower leg  no screening colonoscopy  s/p bilat lumpectomies  Endoscopy  January 2013   Family History:  Reviewed history from 01/24/2008 and no changes required.  Fam hx CAD  Fam hx Breast CA  Erythrodermatitis  father died age 23 MI  mother died age 10. History breast cancer  two aunts with the breast cancer  one aunt with ovarian cancer  Two half-brothers died of MIs  one brother history of coronary artery disease  One sister deceased, unclear causes   Social History:  Reviewed history from 01/24/2008 and no changes required.  Married  Never Smoked  Moved  into [facility] in November 2012  Past Medical History  Diagnosis Date  . Allergic rhinitis, seasonal   . Osteoarthritis   . Menopausal syndrome   . HX: breast cancer     bilateral  . Osteoporosis   . Goiter     right thyroid nodule   . Squamous cell skin cancer     right lower leg  . Chronic venous insufficiency     History   Social History  . Marital Status: Married    Spouse Name: N/A    Number of Children: N/A  . Years of Education: N/A   Occupational History  . Not on file.  Social History Main Topics  . Smoking status: Never Smoker   . Smokeless tobacco: Never Used  . Alcohol Use: No  . Drug Use: No  . Sexually Active: Not on file   Other Topics Concern  . Not on file   Social History Narrative  . No narrative on file    Past Surgical History  Procedure Date  . Left eardum sx 1983  . Cataract extraction   . Tonsillectomy   . Dilation and curettage of uterus   . Umbilical hernia repair   . Bilateral lumpectomies for breast cancer Aug. 2007  . Left knee  arthroscopic surgery   . History of lumbar compression fracture   . Status post resection squamous cell cancer right lower leg   . No screening colonoscopy   . S/p bilateral lumpectomies   . Esophagogastroduodenoscopy 07/26/2011    Procedure: ESOPHAGOGASTRODUODENOSCOPY (EGD);  Surgeon: Petra Kuba, MD;  Location: [facility];  Service: Endoscopy;  Laterality: N/A;  . Savory dilation 07/26/2011    Procedure: SAVORY DILATION;  Surgeon: Petra Kuba, MD;  Location: [facility];  Service: Endoscopy;  Laterality: N/A;    Family History  Problem Relation Age of Onset  . Cancer Mother     breast  . Heart attack Father   . Heart attack Brother   . Cancer Maternal Aunt     breast  . Cancer Paternal Aunt     ovairan cancer  . Coronary artery disease Other   . Cancer Other     breast  . Heart attack Brother   . Heart disease Brother     Allergies  Allergen Reactions  . Celecoxib     REACTION: Swelling-leg    Current Outpatient Prescriptions on File Prior to Visit  Medication Sig Dispense Refill  . Ferrous Sulfate (SLOW FE PO) Take by mouth daily.        . fexofenadine (ALLEGRA) 180 MG tablet Take 180 mg by mouth daily.        . fluticasone (FLONASE) 50 MCG/ACT nasal spray Place 2 sprays into the nose daily.  16 g  6  . furosemide (LASIX) 40 MG tablet Take 1 tablet (40 mg total) by mouth daily.  90 tablet  5  . omeprazole (PRILOSEC) 40 MG capsule       . tamoxifen (NOLVADEX) 20 MG tablet Take 1 tablet (20 mg total) by mouth daily.  90 tablet  0  . triamcinolone (KENALOG) 0.1 % cream Apply 1 application topically 2 (two) times daily.        . zoledronic acid (RECLAST) 5 MG/100ML SOLN Inject 5 mg into the vein once.          BP 140/68  Pulse 80  Temp 98 F (36.7 C) (Oral)  Resp 18  Ht 5' 2.25" (1.581 m)  Wt 180 lb (81.647 kg)  BMI 32.66 kg/m2  SpO2 95%      Review of Systems  Constitutional: Negative for fever, appetite change, fatigue and unexpected weight change.   HENT: Positive for hearing loss. Negative for ear pain, nosebleeds, congestion, sore throat, mouth sores, trouble swallowing, neck stiffness, dental problem, voice change, sinus pressure and tinnitus.   Eyes: Negative for photophobia, pain, redness and visual disturbance.  Respiratory: Negative for cough, chest tightness and shortness of breath.   Cardiovascular: Negative for chest pain, palpitations and leg swelling.  Gastrointestinal: Negative for nausea, vomiting, abdominal pain, diarrhea, constipation, blood in stool, abdominal distention and rectal pain.  Genitourinary: Negative  for dysuria, urgency, frequency, hematuria, flank pain, vaginal bleeding, vaginal discharge, difficulty urinating, genital sores, vaginal pain, menstrual problem and pelvic pain.  Musculoskeletal: Negative for back pain and arthralgias.  Skin: Negative for rash.  Neurological: Negative for dizziness, syncope, speech difficulty, weakness, light-headedness, numbness and headaches.  Hematological: Negative for adenopathy. Does not bruise/bleed easily.  Psychiatric/Behavioral: Negative for suicidal ideas, behavioral problems, self-injury, dysphoric mood and agitation. The patient is not nervous/anxious.        Objective:   Physical Exam  Constitutional: She is oriented to person, place, and time. She appears well-developed and well-nourished.  HENT:  Head: Normocephalic and atraumatic.  Right Ear: External ear normal.  Left Ear: External ear normal.  Mouth/Throat: Oropharynx is clear and moist.       Left tympanic membrane was scarred and perforated  Eyes: Conjunctivae normal and EOM are normal.  Neck: Normal range of motion. Neck supple. No JVD present. No thyromegaly present.  Cardiovascular: Normal rate, regular rhythm and intact distal pulses.   Murmur heard.      Grade 3/6 high-pitched systolic murmur Dorsalis pedis pulses were intact posterior tibial pulses not easily palpable  Pulmonary/Chest: Effort  normal and breath sounds normal. She has no wheezes. She has no rales.  Abdominal: Soft. Bowel sounds are normal. She exhibits no distension and no mass. There is no tenderness. There is no rebound and no guarding.  Musculoskeletal: Normal range of motion. She exhibits edema. She exhibits no tenderness.       Mild left ankle edema and chronic  Neurological: She is alert and oriented to person, place, and time. She has normal reflexes. No cranial nerve deficit. She exhibits normal muscle tone. Coordination normal.  Skin: Skin is warm and dry. No rash noted.  Psychiatric: She has a normal mood and affect. Her behavior is normal.          Assessment & Plan:   Preventive health examination Senile osteoporosis. We'll consider further treatment with oncology next month. We'll continue calcium and vitamin D supplements History breast cancer. Follow up in oncology will check screening labs today and Osteoarthritis stable Allergic rhinitis  Laboratory update  Recheck 1 year

## 2012-06-23 NOTE — Patient Instructions (Addendum)
Limit your sodium (Salt) intake  Avoids foods high in acid such as tomatoes citrus juices, and spicy foods.  Avoid eating within two hours of lying down or before exercising.  Do not overheat.  Try smaller more frequent meals.  If symptoms persist, elevate the head of her bed 12 inches while sleeping.   

## 2012-07-28 ENCOUNTER — Telehealth: Payer: Self-pay | Admitting: *Deleted

## 2012-07-28 DIAGNOSIS — Z1239 Encounter for other screening for malignant neoplasm of breast: Secondary | ICD-10-CM

## 2012-07-28 NOTE — Telephone Encounter (Signed)
ok 

## 2012-07-28 NOTE — Telephone Encounter (Signed)
[name] is requesting an order for a bilateral diagnostic follow up mammogram.  Is this okay to give?

## 2012-08-03 DIAGNOSIS — B351 Tinea unguium: Secondary | ICD-10-CM | POA: Diagnosis not present

## 2012-08-03 DIAGNOSIS — M79609 Pain in unspecified limb: Secondary | ICD-10-CM | POA: Diagnosis not present

## 2012-08-23 DIAGNOSIS — Z853 Personal history of malignant neoplasm of breast: Secondary | ICD-10-CM | POA: Diagnosis not present

## 2012-08-23 LAB — HM MAMMOGRAPHY

## 2012-08-24 ENCOUNTER — Encounter: Payer: Self-pay | Admitting: Internal Medicine

## 2012-10-26 DIAGNOSIS — M79609 Pain in unspecified limb: Secondary | ICD-10-CM | POA: Diagnosis not present

## 2012-10-26 DIAGNOSIS — B351 Tinea unguium: Secondary | ICD-10-CM | POA: Diagnosis not present

## 2012-11-30 DIAGNOSIS — H6120 Impacted cerumen, unspecified ear: Secondary | ICD-10-CM | POA: Diagnosis not present

## 2012-12-04 DIAGNOSIS — H04129 Dry eye syndrome of unspecified lacrimal gland: Secondary | ICD-10-CM | POA: Diagnosis not present

## 2013-01-18 DIAGNOSIS — B351 Tinea unguium: Secondary | ICD-10-CM | POA: Diagnosis not present

## 2013-01-18 DIAGNOSIS — M79609 Pain in unspecified limb: Secondary | ICD-10-CM | POA: Diagnosis not present

## 2013-01-26 ENCOUNTER — Other Ambulatory Visit: Payer: Self-pay | Admitting: *Deleted

## 2013-01-26 MED ORDER — FUROSEMIDE 40 MG PO TABS: 40.0000 mg | ORAL_TABLET | Freq: Every day | ORAL | Status: DC

## 2013-02-09 ENCOUNTER — Other Ambulatory Visit: Payer: Self-pay | Admitting: *Deleted

## 2013-02-09 MED ORDER — FLUTICASONE PROPIONATE 50 MCG/ACT NA SUSP: 2.0000 | Freq: Every day | NASAL | Status: DC

## 2013-02-09 MED ORDER — FUROSEMIDE 40 MG PO TABS: 40.0000 mg | ORAL_TABLET | Freq: Every day | ORAL | Status: DC

## 2013-02-09 MED ORDER — FEXOFENADINE HCL 180 MG PO TABS: 180.0000 mg | ORAL_TABLET | Freq: Every day | ORAL | Status: DC

## 2013-02-09 MED ORDER — OMEPRAZOLE 40 MG PO CPDR: 40.0000 mg | DELAYED_RELEASE_CAPSULE | Freq: Every day | ORAL | Status: DC

## 2013-02-28 DIAGNOSIS — R1310 Dysphagia, unspecified: Secondary | ICD-10-CM | POA: Diagnosis not present

## 2013-03-02 ENCOUNTER — Telehealth: Payer: Self-pay | Admitting: Internal Medicine

## 2013-03-02 DIAGNOSIS — H9191 Unspecified hearing loss, right ear: Secondary | ICD-10-CM

## 2013-03-02 NOTE — Telephone Encounter (Signed)
Spoke to pt asked her why she is seeing Audiology? Pt stated hearing loss right ear. Told pt okay will do referral. Pt verbalized understanding. Order done.

## 2013-03-02 NOTE — Telephone Encounter (Signed)
ok 

## 2013-03-02 NOTE — Telephone Encounter (Signed)
PT called and stated that she has made an appt to see Dr. Adriana Mccallum with [facility]. They are requesting a referral for this visit, for an evaluation of her right ear. The fax number is [phone]. Please assist.

## 2013-03-02 NOTE — Telephone Encounter (Signed)
Left message on voicemail to call office.  

## 2013-03-09 DIAGNOSIS — H905 Unspecified sensorineural hearing loss: Secondary | ICD-10-CM | POA: Diagnosis not present

## 2013-04-09 ENCOUNTER — Encounter: Payer: Self-pay | Admitting: *Deleted

## 2013-04-09 DIAGNOSIS — B351 Tinea unguium: Secondary | ICD-10-CM | POA: Insufficient documentation

## 2013-04-19 ENCOUNTER — Ambulatory Visit: Payer: Self-pay | Admitting: Podiatry

## 2013-05-02 DIAGNOSIS — L738 Other specified follicular disorders: Secondary | ICD-10-CM | POA: Diagnosis not present

## 2013-05-02 DIAGNOSIS — L821 Other seborrheic keratosis: Secondary | ICD-10-CM | POA: Diagnosis not present

## 2013-05-02 DIAGNOSIS — L259 Unspecified contact dermatitis, unspecified cause: Secondary | ICD-10-CM | POA: Diagnosis not present

## 2013-05-02 DIAGNOSIS — L280 Lichen simplex chronicus: Secondary | ICD-10-CM | POA: Diagnosis not present

## 2013-05-02 DIAGNOSIS — L219 Seborrheic dermatitis, unspecified: Secondary | ICD-10-CM | POA: Diagnosis not present

## 2013-05-03 ENCOUNTER — Ambulatory Visit (INDEPENDENT_AMBULATORY_CARE_PROVIDER_SITE_OTHER): Payer: MEDICARE

## 2013-05-03 DIAGNOSIS — Z23 Encounter for immunization: Secondary | ICD-10-CM | POA: Diagnosis not present

## 2013-05-15 ENCOUNTER — Ambulatory Visit: Payer: Self-pay | Admitting: Podiatry

## 2013-05-17 ENCOUNTER — Ambulatory Visit (INDEPENDENT_AMBULATORY_CARE_PROVIDER_SITE_OTHER): Payer: MEDICARE | Admitting: Podiatry

## 2013-05-17 ENCOUNTER — Encounter: Payer: Self-pay | Admitting: Podiatry

## 2013-05-17 VITALS — BP 132/61 | HR 68 | Resp 16 | Ht 63.0 in | Wt 180.0 lb

## 2013-05-17 DIAGNOSIS — M79609 Pain in unspecified limb: Secondary | ICD-10-CM | POA: Diagnosis not present

## 2013-05-17 DIAGNOSIS — B351 Tinea unguium: Secondary | ICD-10-CM

## 2013-05-17 NOTE — Progress Notes (Signed)
[name] presents today for her routine nail debridement as well as debridement of reactive hyperkeratosis plantar aspect the bilateral foot. She denies fever chills nausea vomiting muscle aches or pains.  Objective: Vital signs are stable she is alert and oriented x3. Pulses remain palpable bilateral lower extremity. Nails are thick yellow dystrophic clinically mycotic and painful on palpation as well as debridement. Reactive hyperkeratosis to the plantar aspect of the bilateral foot and the medial aspect of each hallux is painful.  Assessment: Pain in limb secondary reactive hyperkeratosis and elongated thick yellow dystrophic clinically mycotic nails.  Plan: Debridement of nails thickness and length as a covered service. Debridement of reactive hyperkeratosis. Followup with her in 3 months.

## 2013-06-25 ENCOUNTER — Encounter: Payer: Self-pay | Admitting: Internal Medicine

## 2013-06-25 ENCOUNTER — Ambulatory Visit (INDEPENDENT_AMBULATORY_CARE_PROVIDER_SITE_OTHER): Payer: MEDICARE | Admitting: Internal Medicine

## 2013-06-25 DIAGNOSIS — M1990 Unspecified osteoarthritis, unspecified site: Secondary | ICD-10-CM

## 2013-06-25 DIAGNOSIS — Z853 Personal history of malignant neoplasm of breast: Secondary | ICD-10-CM

## 2013-06-25 DIAGNOSIS — E042 Nontoxic multinodular goiter: Secondary | ICD-10-CM

## 2013-06-25 DIAGNOSIS — M810 Age-related osteoporosis without current pathological fracture: Secondary | ICD-10-CM

## 2013-06-25 DIAGNOSIS — I872 Venous insufficiency (chronic) (peripheral): Secondary | ICD-10-CM | POA: Diagnosis not present

## 2013-06-25 DIAGNOSIS — E785 Hyperlipidemia, unspecified: Secondary | ICD-10-CM

## 2013-06-25 DIAGNOSIS — Z0000 Encounter for general adult medical examination without abnormal findings: Secondary | ICD-10-CM

## 2013-06-25 LAB — COMPREHENSIVE METABOLIC PANEL
ALT: 12 U/L (ref 0–35)
AST: 17 U/L (ref 0–37)
Albumin: 3.9 g/dL (ref 3.5–5.2)
BUN: 21 mg/dL (ref 6–23)
CO2: 27 mEq/L (ref 19–32)
Calcium: 9.2 mg/dL (ref 8.4–10.5)
Chloride: 108 mEq/L (ref 96–112)
GFR: 46.7 mL/min — ABNORMAL LOW (ref >60.00)
Potassium: 4.4 mEq/L (ref 3.5–5.1)

## 2013-06-25 LAB — CBC WITH DIFFERENTIAL/PLATELET
Basophils Relative: 0.6 % (ref 0.0–3.0)
Eosinophils Relative: 0.5 % (ref 0.0–5.0)
HCT: 36.6 % (ref 36.0–46.0)
Lymphs Abs: 1.2 10*3/uL (ref 0.7–4.0)
MCHC: 34.1 g/dL (ref 30.0–36.0)
MCV: 91.9 fl (ref 78.0–100.0)
Monocytes Absolute: 0.4 10*3/uL (ref 0.1–1.0)
RBC: 3.98 Mil/uL (ref 3.87–5.11)
WBC: 6 10*3/uL (ref 4.5–10.5)

## 2013-06-25 LAB — LIPID PANEL: Triglycerides: 83 mg/dL (ref 0.0–149.0)

## 2013-06-25 NOTE — Patient Instructions (Addendum)
Limit your sodium (Salt) intake    It is important that you exercise regularly, at least 20 minutes 3 to 4 times per week.  If you develop chest pain or shortness of breath seek  medical attention.  Return in one year for follow-up  Take a calcium supplement, plus 800-1200 units of vitamin D 

## 2013-06-25 NOTE — Progress Notes (Signed)
Patient ID: [name], female   DOB: May 15, 1922, 79 y.o.   MRN: [id number]  Subjective:    Patient ID: [name], female    DOB: 1921/10/15, 79 y.o.   MRN: [id number]  HPI Pre-visit discussion using our clinic review tool. No additional management support is needed unless otherwise documented below in the visit note.   CC: cpx - doing well.   History of Present Illness:   79 year-old patient who is seen today for a wellness exam. She has a history of breast cancer, and senile osteoporosis, and is followed by oncology annually. She is doing quite well. Today. She has a history of osteoarthritis, allergic rhinitis , mild chronic venous insufficiency. She has done quite well and denies any cardiopulmonary complaints.   Here for Medicare AWV:   1. Risk factors based on Past M, S, F history:  factors include aging. Denies any cardiopulmonary complaints  2. Physical Activities: No restrictions 3. Depression/mood: history depression, or mood disorder  4. Hearing:  Patient now wears bilateral hearing 5. ADL's: independent in all aspects of daily living  6. Fall Risk: moderate due to age  82. Home Safety: no problems identified  8. Height, weight, &visual acuity:height and weight stable. No change in visual acuity does receive annual reclast injections  9. Counseling: oncology follow-up recommended  10. Labs ordered based on risk factors: laboratory panel will be reviewed  11. Referral Coordination- oncology referral  12. Care Plan- heart healthy diet more regular exercise. Encouraged  13. Cognitive Assessment- alert and oriented normal affect. No memory disturbance. Able to handle all executive functioning without difficulty   Preventive Screening-Counseling & Management  Alcohol-Tobacco  Smoking Status: never   Allergies:   1) ! Celebrex   Past History:   Allergic rhinitis,seasonal  Osteoarthritis  Menopausal syndrome  Breast cancer, hx of bilateral August 2007  history of  squamous cell skin cancer right lower leg  chronic venous insufficiency  Osteoporosis  Allergic rhinitis  Goiter/ R thyroid nodule   Past Surgical History:  Lt eardrum sx-1983  Cataract extraction  Tonsillectomy  D&C  Umbilical hernia repair  bilateral lumpectomies for breast cancer in August 2007  left knee arthroscopic surgery  history of lumbar compression fracture  status post resection squamous cell cancer right lower leg  no screening colonoscopy  s/p bilat lumpectomies  Endoscopy  January 2013   Family History:   Fam hx CAD  Fam hx Breast CA  Erythrodermatitis  father died age 50 MI  mother died age 75. History breast cancer  two aunts with the breast cancer  one aunt with ovarian cancer  Two half-brothers died of MIs  one brother history of coronary artery disease  One sister deceased, unclear causes   Social History:   Married  Never Smoked  Moved  into [facility] in November 2012  Past Medical History  Diagnosis Date  . Allergic rhinitis, seasonal   . Osteoarthritis   . Menopausal syndrome   . HX: breast cancer     bilateral  . Osteoporosis   . Goiter     right thyroid nodule   . Squamous cell skin cancer     right lower leg  . Chronic venous insufficiency   . Dermatophytosis of nail     History   Social History  . Marital Status: Married    Spouse Name: N/A    Number of Children: N/A  . Years of Education: N/A   Occupational History  .  Not on file.   Social History Main Topics  . Smoking status: Never Smoker   . Smokeless tobacco: Never Used  . Alcohol Use: No  . Drug Use: No  . Sexual Activity: Not on file   Other Topics Concern  . Not on file   Social History Narrative  . No narrative on file    Past Surgical History  Procedure Laterality Date  . Left eardum sx  1983  . Cataract extraction    . Tonsillectomy    . Dilation and curettage of uterus    . Umbilical hernia repair    . Bilateral lumpectomies for breast  cancer  Aug. 2007  . Left knee arthroscopic surgery    . History of lumbar compression fracture    . Status post resection squamous cell cancer right lower leg    . No screening colonoscopy    . S/p bilateral lumpectomies    . Esophagogastroduodenoscopy  07/26/2011    Procedure: ESOPHAGOGASTRODUODENOSCOPY (EGD);  Surgeon: Petra Kuba, MD;  Location: [facility];  Service: Endoscopy;  Laterality: N/A;  . Savory dilation  07/26/2011    Procedure: SAVORY DILATION;  Surgeon: Petra Kuba, MD;  Location: [facility];  Service: Endoscopy;  Laterality: N/A;    Family History  Problem Relation Age of Onset  . Cancer Mother     breast  . Heart attack Father   . Heart attack Brother   . Cancer Maternal Aunt     breast  . Cancer Paternal Aunt     ovairan cancer  . Coronary artery disease Other   . Cancer Other     breast  . Heart attack Brother   . Heart disease Brother     Allergies  Allergen Reactions  . Celecoxib     REACTION: Swelling-leg    Current Outpatient Prescriptions on File Prior to Visit  Medication Sig Dispense Refill  . Ferrous Sulfate (SLOW FE PO) Take by mouth daily.        . fexofenadine (ALLEGRA) 180 MG tablet Take 1 tablet (180 mg total) by mouth daily.  60 tablet  3  . fluticasone (FLONASE) 50 MCG/ACT nasal spray Place 2 sprays into the nose daily.  16 g  6  . furosemide (LASIX) 40 MG tablet Take 1 tablet (40 mg total) by mouth daily.  90 tablet  3  . triamcinolone (KENALOG) 0.1 % cream Apply 1 application topically 2 (two) times daily.         No current facility-administered medications on file prior to visit.    There were no vitals taken for this visit.      Review of Systems  Constitutional: Negative for fever, appetite change, fatigue and unexpected weight change.  HENT: Positive for hearing loss. Negative for congestion, dental problem, ear pain, mouth sores, nosebleeds, sinus pressure, sore throat, tinnitus, trouble swallowing and voice change.    Eyes: Negative for photophobia, pain, redness and visual disturbance.  Respiratory: Negative for cough, chest tightness and shortness of breath.   Cardiovascular: Negative for chest pain, palpitations and leg swelling.  Gastrointestinal: Negative for nausea, vomiting, abdominal pain, diarrhea, constipation, blood in stool, abdominal distention and rectal pain.  Genitourinary: Negative for dysuria, urgency, frequency, hematuria, flank pain, vaginal bleeding, vaginal discharge, difficulty urinating, genital sores, vaginal pain, menstrual problem and pelvic pain.  Musculoskeletal: Negative for arthralgias, back pain and neck stiffness.  Skin: Negative for rash.  Neurological: Negative for dizziness, syncope, speech difficulty, weakness, light-headedness, numbness and headaches.  Hematological: Negative for adenopathy. Does not bruise/bleed easily.  Psychiatric/Behavioral: Negative for suicidal ideas, behavioral problems, self-injury, dysphoric mood and agitation. The patient is not nervous/anxious.        Objective:   Physical Exam  Constitutional: She is oriented to person, place, and time. She appears well-developed and well-nourished.  HENT:  Head: Normocephalic and atraumatic.  Right Ear: External ear normal.  Left Ear: External ear normal.  Mouth/Throat: Oropharynx is clear and moist.  Left tympanic membrane was scarred and perforated  Arcus senilis  Eyes: Conjunctivae and EOM are normal.  Neck: Normal range of motion. Neck supple. No JVD present. No thyromegaly present.  Left supraclavicular bruit versus transmitted murmur  Cardiovascular: Normal rate, regular rhythm and intact distal pulses.   Murmur heard. Grade 3/6 high-pitched systolic murmur Dorsalis pedis pulses were intact posterior tibial pulses not easily palpable  Pulmonary/Chest: Effort normal and breath sounds normal. She has no wheezes. She has no rales.  Abdominal: Soft. Bowel sounds are normal. She exhibits no  distension and no mass. There is no tenderness. There is no rebound and no guarding.  Musculoskeletal: Normal range of motion. She exhibits edema. She exhibits no tenderness.  Mild left ankle edema and chronic  Neurological: She is alert and oriented to person, place, and time. She has normal reflexes. No cranial nerve deficit. She exhibits normal muscle tone. Coordination normal.  Skin: Skin is warm and dry. No rash noted.  Psychiatric: She has a normal mood and affect. Her behavior is normal.          Assessment & Plan:   Preventive health examination Senile osteoporosis. We'll consider further treatment with oncology next month. We'll continue calcium and vitamin D supplements History breast cancer. Follow up in oncology will check screening labs today and Osteoarthritis stable Allergic rhinitis  Laboratory update  Recheck 1 year

## 2013-06-25 NOTE — Progress Notes (Signed)
Pre-visit discussion using our clinic review tool. No additional management support is needed unless otherwise documented below in the visit note.  

## 2013-06-29 ENCOUNTER — Other Ambulatory Visit (INDEPENDENT_AMBULATORY_CARE_PROVIDER_SITE_OTHER): Payer: MEDICARE

## 2013-06-29 DIAGNOSIS — D649 Anemia, unspecified: Secondary | ICD-10-CM | POA: Diagnosis not present

## 2013-06-29 LAB — HEMOCCULT GUIAC POC 1CARD (OFFICE)

## 2013-07-10 ENCOUNTER — Telehealth: Payer: Self-pay | Admitting: Internal Medicine

## 2013-07-10 NOTE — Telephone Encounter (Signed)
Patient Information:  Caller Name: Nadine Counts  Phone: [phone]  Patient: [name], [place]  Gender: Female  DOB: 10/15/1921  Age: 79 Years  PCP: [name] (Family Practice > 45yrs old)  Office Follow Up:  Does the office need to follow up with this patient?: Yes  Instructions For The Office: Requesting prescription for cough med be called in to [facility] [phone]. Please call Nadine Counts when medication is called in to pharmacy   Symptoms  Reason For Call & Symptoms: Son/Bob calling on emergent line and states [place] had cough onset 12/21.Afebrile.  Was seen by nurse at [facility] 07/10/13. Only recommendation was to wear a mask. Cough causing her abdomen to hurt. Per cough protocol has see today in office due to severe coughing spells. No appts left in [facility]. Nadine Counts states he has called office and requested [facility]. Please call Nadine Counts when medication is called in to pharmacy.  Reviewed Health History In EMR: N/A  Reviewed Medications In EMR: N/A  Reviewed Allergies In EMR: N/A  Reviewed Surgeries / Procedures: N/A  Date of Onset of Symptoms: 07/08/2013  Treatments Tried: Fluids, Throat lozenges  Treatments Tried Worked: No  Guideline(s) Used:  Cough  Disposition Per Guideline:   See Today in Office  Reason For Disposition Reached:   Severe coughing spells (e.g., whooping sound after coughing, vomiting after coughing)  Advice Given:  N/A  Patient Refused Recommendation:  Patient Requests Prescription  Requesting prescription for cough medication

## 2013-07-10 NOTE — Telephone Encounter (Signed)
Pt has a very bad cough, no fever, no sore throat.  Would like to know if D K would rx a cough med for her asap. pls advise.

## 2013-07-10 NOTE — Telephone Encounter (Signed)
Talked with son and told him dr Kirtland Bouchard had left the office for th day and will give himn message in am, but meanwhile at her age, do not  hestitate to send her to the ed if the coughing or other sx worsens.

## 2013-07-11 MED ORDER — BENZONATATE 200 MG PO CAPS: 200.0000 mg | ORAL_CAPSULE | Freq: Three times a day (TID) | ORAL | Status: DC | PRN

## 2013-07-11 NOTE — Telephone Encounter (Signed)
Pt following up on request for med. pls advise [facility]

## 2013-07-11 NOTE — Telephone Encounter (Signed)
Tessalon 200 mg #30 one every 8 hours

## 2013-07-13 ENCOUNTER — Encounter: Payer: Self-pay | Admitting: Family Medicine

## 2013-07-13 ENCOUNTER — Ambulatory Visit (INDEPENDENT_AMBULATORY_CARE_PROVIDER_SITE_OTHER): Payer: MEDICARE | Admitting: Family Medicine

## 2013-07-13 VITALS — BP 118/64 | HR 82 | Temp 97.9°F | Wt 176.0 lb

## 2013-07-13 DIAGNOSIS — R062 Wheezing: Secondary | ICD-10-CM

## 2013-07-13 DIAGNOSIS — R05 Cough: Secondary | ICD-10-CM

## 2013-07-13 MED ORDER — METHYLPREDNISOLONE ACETATE 80 MG/ML IJ SUSP
80.0000 mg | Freq: Once | INTRAMUSCULAR | Status: AC
  Administered 2013-07-13: 80 mg via INTRAMUSCULAR

## 2013-07-13 MED ORDER — LEVOFLOXACIN 500 MG PO TABS: 500.0000 mg | ORAL_TABLET | Freq: Every day | ORAL | Status: DC

## 2013-07-13 NOTE — Progress Notes (Signed)
Pre visit review using our clinic review tool, if applicable. No additional management support is needed unless otherwise documented below in the visit note. 

## 2013-07-13 NOTE — Addendum Note (Signed)
Addended by: Shelby Dubin E on: 07/13/2013 01:30 PM   Modules accepted: Orders

## 2013-07-13 NOTE — Patient Instructions (Signed)
Follow for any fever or increasing shortness of breath.

## 2013-07-13 NOTE — Progress Notes (Signed)
   Subjective:    Patient ID: [name], female    DOB: Jun 20, 1922, 79 y.o.   MRN: [id number]  HPI 79 year old nonsmoker seen with cough and chest congestion for the past week. No fever. No dyspnea. Does have some wheezing off and on. No history of asthma. Denies any sore throat. Minimal nasal congestion. Took some Tessalon Perles this morning which has not helped much. No dyspnea at rest.  Past Medical History  Diagnosis Date  . Allergic rhinitis, seasonal   . Osteoarthritis   . Menopausal syndrome   . HX: breast cancer     bilateral  . Osteoporosis   . Goiter     right thyroid nodule   . Squamous cell skin cancer     right lower leg  . Chronic venous insufficiency   . Dermatophytosis of nail    Past Surgical History  Procedure Laterality Date  . Left eardum sx  1983  . Cataract extraction    . Tonsillectomy    . Dilation and curettage of uterus    . Umbilical hernia repair    . Bilateral lumpectomies for breast cancer  Aug. 2007  . Left knee arthroscopic surgery    . History of lumbar compression fracture    . Status post resection squamous cell cancer right lower leg    . No screening colonoscopy    . S/p bilateral lumpectomies    . Esophagogastroduodenoscopy  07/26/2011    Procedure: ESOPHAGOGASTRODUODENOSCOPY (EGD);  Surgeon: Petra Kuba, MD;  Location: [facility];  Service: Endoscopy;  Laterality: N/A;  . Savory dilation  07/26/2011    Procedure: SAVORY DILATION;  Surgeon: Petra Kuba, MD;  Location: [facility];  Service: Endoscopy;  Laterality: N/A;    reports that she has never smoked. She has never used smokeless tobacco. She reports that she does not drink alcohol or use illicit drugs. family history includes Cancer in her maternal aunt, mother, other, and paternal aunt; Coronary artery disease in her other; Heart attack in her brother, brother, and father; Heart disease in her brother. Allergies  Allergen Reactions  . Celecoxib     REACTION: Swelling-leg        Review of Systems  Constitutional: Negative for fever and chills.  HENT: Positive for congestion.   Respiratory: Positive for cough and wheezing.        Objective:   Physical Exam  Constitutional: She appears well-developed and well-nourished.  HENT:  Mouth/Throat: Oropharynx is clear and moist.  Neck: Neck supple.  Cardiovascular: Normal rate.   Pulmonary/Chest:  Patient some faint expiratory wheezes. Faint rales right upper anterior chest. No retractions  Musculoskeletal: She exhibits no edema.          Assessment & Plan:  Cough with reactive airway component. Pulse oximetry 97% in no respiratory distress. She does have some faint crackles right upper anterior airway. Start Levaquin 500 milligrams once daily for 7 days. Depo-Medrol 80 mg IM given. Followup promptly for any fever or worsening symptoms.

## 2013-08-14 ENCOUNTER — Encounter: Payer: Self-pay | Admitting: Podiatry

## 2013-08-14 ENCOUNTER — Ambulatory Visit (INDEPENDENT_AMBULATORY_CARE_PROVIDER_SITE_OTHER): Payer: MEDICARE | Admitting: Podiatry

## 2013-08-14 VITALS — BP 127/61 | HR 72 | Resp 20

## 2013-08-14 DIAGNOSIS — B351 Tinea unguium: Secondary | ICD-10-CM

## 2013-08-14 DIAGNOSIS — M79609 Pain in unspecified limb: Secondary | ICD-10-CM

## 2013-08-14 NOTE — Progress Notes (Signed)
Nails trimmed and the calluses .  Objective: Vital signs are stable she is alert and oriented x3. She has calluses plantar aspect the bilateral foot with thick yellow dystrophic clinically mycotic nails which are painful palpation as well as debridement.  Assessment: Corns and calluses with pain in limb secondary to onychomycosis 1 through 5 bilateral.  Plan: Debridement of nails 1 through 5 bilateral covered service secondary to pain.

## 2013-08-27 DIAGNOSIS — Z1231 Encounter for screening mammogram for malignant neoplasm of breast: Secondary | ICD-10-CM | POA: Diagnosis not present

## 2013-08-29 DIAGNOSIS — R928 Other abnormal and inconclusive findings on diagnostic imaging of breast: Secondary | ICD-10-CM | POA: Diagnosis not present

## 2013-09-26 ENCOUNTER — Encounter: Payer: Self-pay | Admitting: Internal Medicine

## 2013-11-08 ENCOUNTER — Encounter: Payer: Self-pay | Admitting: Podiatry

## 2013-11-08 ENCOUNTER — Ambulatory Visit (INDEPENDENT_AMBULATORY_CARE_PROVIDER_SITE_OTHER): Payer: MEDICARE | Admitting: Podiatry

## 2013-11-08 VITALS — BP 122/82 | HR 62 | Resp 16

## 2013-11-08 DIAGNOSIS — M79609 Pain in unspecified limb: Secondary | ICD-10-CM | POA: Diagnosis not present

## 2013-11-08 DIAGNOSIS — B351 Tinea unguium: Secondary | ICD-10-CM

## 2013-11-08 DIAGNOSIS — Q828 Other specified congenital malformations of skin: Secondary | ICD-10-CM | POA: Diagnosis not present

## 2013-11-09 NOTE — Progress Notes (Signed)
She presents today with a chief complaint of painful elongated toenails bilateral.  Objective: Pulses are strongly palpable bilateral. Nails are thick yellow dystrophic with mycotic and painful palpation. She also has porokeratotic lesions plantar aspect of the bilateral foot which are painful for her.  Assessment: Pain in limb secondary to onychomycosis 1 through 5 bilateral.  She also has porokeratotic lesions plantar aspect of the bilateral foot.  Plan: Debridement of nails in 1 through 5 bilateral is a very to pain. Debridement of reactive hyperkeratosis bilateral.

## 2013-12-06 DIAGNOSIS — H04129 Dry eye syndrome of unspecified lacrimal gland: Secondary | ICD-10-CM | POA: Diagnosis not present

## 2014-01-24 ENCOUNTER — Encounter: Payer: Self-pay | Admitting: Podiatry

## 2014-01-24 ENCOUNTER — Ambulatory Visit (INDEPENDENT_AMBULATORY_CARE_PROVIDER_SITE_OTHER): Payer: MEDICARE | Admitting: Podiatry

## 2014-01-24 VITALS — BP 123/82 | HR 67 | Resp 16

## 2014-01-24 DIAGNOSIS — B351 Tinea unguium: Secondary | ICD-10-CM

## 2014-01-24 DIAGNOSIS — Q828 Other specified congenital malformations of skin: Secondary | ICD-10-CM | POA: Diagnosis not present

## 2014-01-24 DIAGNOSIS — M79609 Pain in unspecified limb: Secondary | ICD-10-CM

## 2014-01-24 DIAGNOSIS — M79676 Pain in unspecified toe(s): Secondary | ICD-10-CM

## 2014-01-24 NOTE — Progress Notes (Signed)
She presents today with a chief complaint of painful elongated toenails and painful calluses plantar aspect of the bilateral foot.  Objective: Pulses are strongly palpable bilateral. Vital signs are stable she is alert and oriented x3. Nails are thick yellow dystrophic with mycotic and painful palpation. Porokeratotic lesions are present to the plantar aspect of the bilateral foot is exquisitely tender on palpation as well as debridement.  Assessment: Pain in limb secondary to onychomycosis 1 through 5 bilateral. And porokeratotic lesions bilateral.  Plan: Debridement of all reactive hyperkeratosis and debridement of nails 1 through 5 bilateral covered service secondary to pain.

## 2014-02-25 DIAGNOSIS — R198 Other specified symptoms and signs involving the digestive system and abdomen: Secondary | ICD-10-CM | POA: Diagnosis not present

## 2014-02-25 DIAGNOSIS — R1310 Dysphagia, unspecified: Secondary | ICD-10-CM | POA: Diagnosis not present

## 2014-02-25 DIAGNOSIS — K2100 Gastro-esophageal reflux disease with esophagitis, without bleeding: Secondary | ICD-10-CM | POA: Diagnosis not present

## 2014-03-05 DIAGNOSIS — R928 Other abnormal and inconclusive findings on diagnostic imaging of breast: Secondary | ICD-10-CM | POA: Diagnosis not present

## 2014-03-05 LAB — HM MAMMOGRAPHY

## 2014-05-06 ENCOUNTER — Ambulatory Visit: Payer: MEDICARE | Admitting: *Deleted

## 2014-05-09 ENCOUNTER — Ambulatory Visit: Payer: MEDICARE | Admitting: Podiatry

## 2014-05-09 ENCOUNTER — Ambulatory Visit (INDEPENDENT_AMBULATORY_CARE_PROVIDER_SITE_OTHER): Payer: MEDICARE | Admitting: Podiatry

## 2014-05-09 DIAGNOSIS — M79673 Pain in unspecified foot: Secondary | ICD-10-CM | POA: Diagnosis not present

## 2014-05-09 DIAGNOSIS — B351 Tinea unguium: Secondary | ICD-10-CM

## 2014-05-09 NOTE — Progress Notes (Signed)
   Subjective:    Patient ID: [name], female    DOB: 08/15/1921, 80 y.o.   MRN: [id number]  HPI  Pt presents for nail debridement  Review of Systems     Objective:   Physical Exam: Pulses are strongly palpable bilateral. Porokeratosis are present plantar aspect of the bilateral foot. Nails are thick yellow dystrophic onychomycotic and painful palpation.        Assessment & Plan:  Assessment: Pain in limb secondary to onychomycosis 1 through 5 bilateral. Pain in limb secondary to porokeratotic lesions plantar aspect of the bilateral foot.  Plan: Debridement nails 1 through 5 bilateral is cover service secondary to pain. Debridement all reactive hyperkeratosis. 

## 2014-06-25 ENCOUNTER — Encounter: Payer: MEDICARE | Admitting: Internal Medicine

## 2014-07-25 ENCOUNTER — Encounter: Payer: Self-pay | Admitting: Internal Medicine

## 2014-07-25 ENCOUNTER — Encounter: Payer: MEDICARE | Admitting: Internal Medicine

## 2014-07-25 ENCOUNTER — Ambulatory Visit (INDEPENDENT_AMBULATORY_CARE_PROVIDER_SITE_OTHER): Payer: MEDICARE | Admitting: Internal Medicine

## 2014-07-25 VITALS — BP 110/70 | HR 71 | Temp 98.1°F | Resp 18 | Ht 62.0 in | Wt 176.0 lb

## 2014-07-25 DIAGNOSIS — I872 Venous insufficiency (chronic) (peripheral): Secondary | ICD-10-CM

## 2014-07-25 DIAGNOSIS — Z0000 Encounter for general adult medical examination without abnormal findings: Secondary | ICD-10-CM | POA: Diagnosis not present

## 2014-07-25 DIAGNOSIS — Z8719 Personal history of other diseases of the digestive system: Secondary | ICD-10-CM | POA: Diagnosis not present

## 2014-07-25 DIAGNOSIS — E785 Hyperlipidemia, unspecified: Secondary | ICD-10-CM | POA: Diagnosis not present

## 2014-07-25 DIAGNOSIS — Z853 Personal history of malignant neoplasm of breast: Secondary | ICD-10-CM

## 2014-07-25 DIAGNOSIS — M810 Age-related osteoporosis without current pathological fracture: Secondary | ICD-10-CM

## 2014-07-25 DIAGNOSIS — M150 Primary generalized (osteo)arthritis: Secondary | ICD-10-CM | POA: Diagnosis not present

## 2014-07-25 DIAGNOSIS — M159 Polyosteoarthritis, unspecified: Secondary | ICD-10-CM

## 2014-07-25 NOTE — Progress Notes (Signed)
   Subjective:    Patient ID: [name], female    DOB: Jun 29, 1922, 81 y.o.   MRN: [id number]  HPI    Review of Systems     Objective:   Physical Exam        Assessment & Plan:

## 2014-07-25 NOTE — Progress Notes (Signed)
Patient ID: [name], female   DOB: 09/04/1921, 81 y.o.   MRN: [id number]  Subjective:    Patient ID: [name], female    DOB: Jan 15, 1922, 81 y.o.   MRN: [id number]  HPI Pre-visit discussion using our clinic review tool. No additional management support is needed unless otherwise documented below in the visit note.   CC: cpx - doing well.   History of Present Illness:   81 year-old patient who is seen today for a wellness exam.  She has a history of breast cancer, and senile osteoporosis, and is followed by oncology annually. She is doing quite well. Today. She has a history of osteoarthritis, allergic rhinitis , mild chronic venous insufficiency. She has done quite well and denies any cardiopulmonary complaints.   Here for Medicare AWV:   1. Risk factors based on Past M, S, F history:  factors include aging. Denies any cardiopulmonary complaints  2. Physical Activities: No restrictions 3. Depression/mood: history depression, or mood disorder  4. Hearing:  Patient now wears bilateral hearing 5. ADL's: independent in all aspects of daily living  6. Fall Risk: moderate due to age  49. Home Safety: no problems identified  8. Height, weight, &visual acuity:height and weight stable. No change in visual acuity does receive annual reclast injections  9. Counseling: oncology follow-up recommended  10. Labs ordered based on risk factors: laboratory panel will be reviewed  11. Referral Coordination- oncology referral  12. Care Plan- heart healthy diet more regular exercise. Encouraged  13. Cognitive Assessment- alert and oriented normal affect. No memory disturbance. Able to handle all executive functioning without difficulty  14.  Preventive services will include annual health examinations with screening lab.  She will consider annual mammograms and oncology follow-up.  Patient was provided with a written and personalized care plan 15.  Provider list update includes primary care GI  oncology and radiology  Preventive Screening-Counseling & Management  Alcohol-Tobacco  Smoking Status: never   Allergies:   1) ! Celebrex   Past History:   Allergic rhinitis,seasonal  Osteoarthritis  Menopausal syndrome  Breast cancer, hx of bilateral August 2007  history of squamous cell skin cancer right lower leg  chronic venous insufficiency  Osteoporosis  Allergic rhinitis  Goiter/ R thyroid nodule   Past Surgical History:  Lt eardrum sx-1983  Cataract extraction  Tonsillectomy  D&C  Umbilical hernia repair  bilateral lumpectomies for breast cancer in August 2007  left knee arthroscopic surgery  history of lumbar compression fracture  status post resection squamous cell cancer right lower leg  no screening colonoscopy  s/p bilat lumpectomies  Endoscopy  January 2013   Family History:   Fam hx CAD  Fam hx Breast CA  Erythrodermatitis  father died age 12 MI  mother died age 52. History breast cancer  two aunts with the breast cancer  one aunt with ovarian cancer  Two half-brothers died of MIs  one brother history of coronary artery disease  One sister deceased, unclear causes   Social History:   Married  Never Smoked  Moved  into [facility] in November 2012  Past Medical History  Diagnosis Date  . Allergic rhinitis, seasonal   . Osteoarthritis   . Menopausal syndrome   . HX: breast cancer     bilateral  . Osteoporosis   . Goiter     right thyroid nodule   . Squamous cell skin cancer     right lower leg  . Chronic venous insufficiency   .  Dermatophytosis of nail     History   Social History  . Marital Status: Married    Spouse Name: N/A    Number of Children: N/A  . Years of Education: N/A   Occupational History  . Not on file.   Social History Main Topics  . Smoking status: Never Smoker   . Smokeless tobacco: Never Used  . Alcohol Use: No  . Drug Use: No  . Sexual Activity: Not on file   Other Topics Concern  . Not on  file   Social History Narrative    Past Surgical History  Procedure Laterality Date  . Left eardum sx  1983  . Cataract extraction    . Tonsillectomy    . Dilation and curettage of uterus    . Umbilical hernia repair    . Bilateral lumpectomies for breast cancer  Aug. 2007  . Left knee arthroscopic surgery    . History of lumbar compression fracture    . Status post resection squamous cell cancer right lower leg    . No screening colonoscopy    . S/p bilateral lumpectomies    . Esophagogastroduodenoscopy  07/26/2011    Procedure: ESOPHAGOGASTRODUODENOSCOPY (EGD);  Surgeon: Jeryl Columbia, MD;  Location: [facility];  Service: Endoscopy;  Laterality: N/A;  . Savory dilation  07/26/2011    Procedure: SAVORY DILATION;  Surgeon: Jeryl Columbia, MD;  Location: [facility];  Service: Endoscopy;  Laterality: N/A;    Family History  Problem Relation Age of Onset  . Cancer Mother     breast  . Heart attack Father   . Heart attack Brother   . Cancer Maternal Aunt     breast  . Cancer Paternal Aunt     ovairan cancer  . Coronary artery disease Other   . Cancer Other     breast  . Heart attack Brother   . Heart disease Brother     Allergies  Allergen Reactions  . Celecoxib     REACTION: Swelling-leg    Current Outpatient Prescriptions on File Prior to Visit  Medication Sig Dispense Refill  . cycloSPORINE (RESTASIS) 0.05 % ophthalmic emulsion Place 1 drop into both eyes 2 (two) times daily.    . fexofenadine (ALLEGRA) 180 MG tablet Take 1 tablet (180 mg total) by mouth daily. 60 tablet 3  . fluticasone (FLONASE) 50 MCG/ACT nasal spray Place 2 sprays into the nose daily. 16 g 6  . furosemide (LASIX) 40 MG tablet Take 1 tablet (40 mg total) by mouth daily. 90 tablet 3  . pantoprazole (PROTONIX) 40 MG tablet Take 40 mg by mouth daily.     Marland Kitchen triamcinolone (KENALOG) 0.1 % cream Apply 1 application topically 2 (two) times daily.       No current facility-administered medications on  file prior to visit.    BP 110/70 mmHg  Pulse 71  Temp(Src) 98.1 F (36.7 C) (Oral)  Resp 18  Ht 5\' 2"  (1.575 m)  Wt 176 lb (79.833 kg)  BMI 32.18 kg/m2  SpO2 98%      Review of Systems  Constitutional: Negative for fever, appetite change, fatigue and unexpected weight change.  HENT: Positive for hearing loss. Negative for congestion, dental problem, ear pain, mouth sores, nosebleeds, sinus pressure, sore throat, tinnitus, trouble swallowing and voice change.   Eyes: Negative for photophobia, pain, redness and visual disturbance.  Respiratory: Negative for cough, chest tightness and shortness of breath.   Cardiovascular: Negative for chest pain, palpitations  and leg swelling.  Gastrointestinal: Negative for nausea, vomiting, abdominal pain, diarrhea, constipation, blood in stool, abdominal distention and rectal pain.  Genitourinary: Negative for dysuria, urgency, frequency, hematuria, flank pain, vaginal bleeding, vaginal discharge, difficulty urinating, genital sores, vaginal pain, menstrual problem and pelvic pain.  Musculoskeletal: Negative for back pain, arthralgias and neck stiffness.  Skin: Negative for rash.  Neurological: Negative for dizziness, syncope, speech difficulty, weakness, light-headedness, numbness and headaches.  Hematological: Negative for adenopathy. Does not bruise/bleed easily.  Psychiatric/Behavioral: Negative for suicidal ideas, behavioral problems, self-injury, dysphoric mood and agitation. The patient is not nervous/anxious.        Objective:   Physical Exam  Constitutional: She is oriented to person, place, and time. She appears well-developed and well-nourished.  HENT:  Head: Normocephalic and atraumatic.  Right Ear: External ear normal.  Left Ear: External ear normal.  Mouth/Throat: Oropharynx is clear and moist.  Bilateral hearing aids  Eyes: Conjunctivae and EOM are normal.  Neck: Normal range of motion. Neck supple. No JVD present. No  thyromegaly present.  Cardiovascular: Normal rate, regular rhythm and intact distal pulses.   Murmur heard. Grade 2/6 high-pitched systolic murmur Dorsalis pedis pulses full.  Posterior tibial pulses faint  Pulmonary/Chest: Effort normal and breath sounds normal. She has no wheezes. She has no rales.  Abdominal: Soft. Bowel sounds are normal. She exhibits no distension and no mass. There is no tenderness. There is no rebound and no guarding.  Musculoskeletal: Normal range of motion. She exhibits no edema or tenderness.  Neurological: She is alert and oriented to person, place, and time. She has normal reflexes. No cranial nerve deficit. She exhibits normal muscle tone. Coordination normal.  Skin: Skin is warm and dry. No rash noted.  Psychiatric: She has a normal mood and affect. Her behavior is normal.          Assessment & Plan:   Preventive health examination Senile osteoporosis. We'll consider further treatment with oncology next month. We'll continue calcium and vitamin D supplements History breast cancer. Follow up in oncology will check screening labs today  Osteoarthritis stable Allergic rhinitis  Laboratory update  Recheck 1 year

## 2014-07-25 NOTE — Progress Notes (Signed)
Pre visit review using our clinic review tool, if applicable. No additional management support is needed unless otherwise documented below in the visit note. 

## 2014-07-25 NOTE — Patient Instructions (Signed)
Take a calcium supplement, plus 800-1200 units of vitamin D    It is important that you exercise regularly, at least 20 minutes 3 to 4 times per week.  If you develop chest pain or shortness of breath seek  medical attention.  Return in one year for follow-up Health Maintenance Adopting a healthy lifestyle and getting preventive care can go a long way to promote health and wellness. Talk with your health care provider about what schedule of regular examinations is right for you. This is a good chance for you to check in with your provider about disease prevention and staying healthy. In between checkups, there are plenty of things you can do on your own. Experts have done a lot of research about which lifestyle changes and preventive measures are most likely to keep you healthy. Ask your health care provider for more information. WEIGHT AND DIET  Eat a healthy diet  Be sure to include plenty of vegetables, fruits, low-fat dairy products, and lean protein.  Do not eat a lot of foods high in solid fats, added sugars, or salt.  Get regular exercise. This is one of the most important things you can do for your health.  Most adults should exercise for at least 150 minutes each week. The exercise should increase your heart rate and make you sweat (moderate-intensity exercise).  Most adults should also do strengthening exercises at least twice a week. This is in addition to the moderate-intensity exercise.  Maintain a healthy weight  Body mass index (BMI) is a measurement that can be used to identify possible weight problems. It estimates body fat based on height and weight. Your health care provider can help determine your BMI and help you achieve or maintain a healthy weight.  For females 20 years of age and older:   A BMI below 18.5 is considered underweight.  A BMI of 18.5 to 24.9 is normal.  A BMI of 25 to 29.9 is considered overweight.  A BMI of 30 and above is considered obese.   Watch levels of cholesterol and blood lipids  You should start having your blood tested for lipids and cholesterol at 79 years of age, then have this test every 5 years.  You may need to have your cholesterol levels checked more often if:  Your lipid or cholesterol levels are high.  You are older than 79 years of age.  You are at high risk for heart disease.  CANCER SCREENING   Lung Cancer  Lung cancer screening is recommended for adults 55-80 years old who are at high risk for lung cancer because of a history of smoking.  A yearly low-dose CT scan of the lungs is recommended for people who:  Currently smoke.  Have quit within the past 15 years.  Have at least a 30-pack-year history of smoking. A pack year is smoking an average of one pack of cigarettes a day for 1 year.  Yearly screening should continue until it has been 15 years since you quit.  Yearly screening should stop if you develop a health problem that would prevent you from having lung cancer treatment.  Breast Cancer  Practice breast self-awareness. This means understanding how your breasts normally appear and feel.  It also means doing regular breast self-exams. Let your health care provider know about any changes, no matter how small.  If you are in your 20s or 30s, you should have a clinical breast exam (CBE) by a health care provider every 1-3 years   years as part of a regular health exam.  If you are 32 or older, have a CBE every year. Also consider having a breast X-ray (mammogram) every year.  If you have a family history of breast cancer, talk to your health care provider about genetic screening.  If you are at high risk for breast cancer, talk to your health care provider about having an MRI and a mammogram every year.  Breast cancer gene (BRCA) assessment is recommended for women who have family members with BRCA-related cancers. BRCA-related cancers  include:  Breast.  Ovarian.  Tubal.  Peritoneal cancers.  Results of the assessment will determine the need for genetic counseling and BRCA1 and BRCA2 testing. Cervical Cancer Routine pelvic examinations to screen for cervical cancer are no longer recommended for nonpregnant women who are considered low risk for cancer of the pelvic organs (ovaries, uterus, and vagina) and who do not have symptoms. A pelvic examination may be necessary if you have symptoms including those associated with pelvic infections. Ask your health care provider if a screening pelvic exam is right for you.   The Pap test is the screening test for cervical cancer for women who are considered at risk.  If you had a hysterectomy for a problem that was not cancer or a condition that could lead to cancer, then you no longer need Pap tests.  If you are older than 65 years, and you have had normal Pap tests for the past 10 years, you no longer need to have Pap tests.  If you have had past treatment for cervical cancer or a condition that could lead to cancer, you need Pap tests and screening for cancer for at least 20 years after your treatment.  If you no longer get a Pap test, assess your risk factors if they change (such as having a new sexual partner). This can affect whether you should start being screened again.  Some women have medical problems that increase their chance of getting cervical cancer. If this is the case for you, your health care provider may recommend more frequent screening and Pap tests.  The human papillomavirus (HPV) test is another test that may be used for cervical cancer screening. The HPV test looks for the virus that can cause cell changes in the cervix. The cells collected during the Pap test can be tested for HPV.  The HPV test can be used to screen women 34 years of age and older. Getting tested for HPV can extend the interval between normal Pap tests from three to five years.  An HPV  test also should be used to screen women of any age who have unclear Pap test results.  After 79 years of age, women should have HPV testing as often as Pap tests.  Colorectal Cancer  This type of cancer can be detected and often prevented.  Routine colorectal cancer screening usually begins at 79 years of age and continues through 79 years of age.  Your health care provider may recommend screening at an earlier age if you have risk factors for colon cancer.  Your health care provider may also recommend using home test kits to check for hidden blood in the stool.  A small camera at the end of a tube can be used to examine your colon directly (sigmoidoscopy or colonoscopy). This is done to check for the earliest forms of colorectal cancer.  Routine screening usually begins at age 58.  Direct examination of the colon should be repeated  every 5-10 years through 79 years of age. However, you may need to be screened more often if early forms of precancerous polyps or small growths are found. Skin Cancer  Check your skin from head to toe regularly.  Tell your health care provider about any new moles or changes in moles, especially if there is a change in a mole's shape or color.  Also tell your health care provider if you have a mole that is larger than the size of a pencil eraser.  Always use sunscreen. Apply sunscreen liberally and repeatedly throughout the day.  Protect yourself by wearing long sleeves, pants, a wide-brimmed hat, and sunglasses whenever you are outside. HEART DISEASE, DIABETES, AND HIGH BLOOD PRESSURE   Have your blood pressure checked at least every 1-2 years. High blood pressure causes heart disease and increases the risk of stroke.  If you are between 70 years and 63 years old, ask your health care provider if you should take aspirin to prevent strokes.  Have regular diabetes screenings. This involves taking a blood sample to check your fasting blood sugar  level.  If you are at a normal weight and have a low risk for diabetes, have this test once every three years after 79 years of age.  If you are overweight and have a high risk for diabetes, consider being tested at a younger age or more often. PREVENTING INFECTION  Hepatitis B  If you have a higher risk for hepatitis B, you should be screened for this virus. You are considered at high risk for hepatitis B if:  You were born in a country where hepatitis B is common. Ask your health care provider which countries are considered high risk.  Your parents were born in a high-risk country, and you have not been immunized against hepatitis B (hepatitis B vaccine).  You have HIV or AIDS.  You use needles to inject street drugs.  You live with someone who has hepatitis B.  You have had sex with someone who has hepatitis B.  You get hemodialysis treatment.  You take certain medicines for conditions, including cancer, organ transplantation, and autoimmune conditions. Hepatitis C  Blood testing is recommended for:  Everyone born from [birth year] through 1963.  Anyone with known risk factors for hepatitis C. Sexually transmitted infections (STIs)  You should be screened for sexually transmitted infections (STIs) including gonorrhea and chlamydia if:  You are sexually active and are younger than 79 years of age.  You are older than 79 years of age and your health care provider tells you that you are at risk for this type of infection.  Your sexual activity has changed since you were last screened and you are at an increased risk for chlamydia or gonorrhea. Ask your health care provider if you are at risk.  If you do not have HIV, but are at risk, it may be recommended that you take a prescription medicine daily to prevent HIV infection. This is called pre-exposure prophylaxis (PrEP). You are considered at risk if:  You are sexually active and do not regularly use condoms or know the HIV status  of your partner(s).  You take drugs by injection.  You are sexually active with a partner who has HIV. Talk with your health care provider about whether you are at high risk of being infected with HIV. If you choose to begin PrEP, you should first be tested for HIV. You should then be tested every 3 months for as long as  you are taking PrEP.  PREGNANCY   If you are premenopausal and you may become pregnant, ask your health care provider about preconception counseling.  If you may become pregnant, take 400 to 800 micrograms (mcg) of folic acid every day.  If you want to prevent pregnancy, talk to your health care provider about birth control (contraception). OSTEOPOROSIS AND MENOPAUSE   Osteoporosis is a disease in which the bones lose minerals and strength with aging. This can result in serious bone fractures. Your risk for osteoporosis can be identified using a bone density scan.  If you are 65 years of age or older, or if you are at risk for osteoporosis and fractures, ask your health care provider if you should be screened.  Ask your health care provider whether you should take a calcium or vitamin D supplement to lower your risk for osteoporosis.  Menopause may have certain physical symptoms and risks.  Hormone replacement therapy may reduce some of these symptoms and risks. Talk to your health care provider about whether hormone replacement therapy is right for you.  HOME CARE INSTRUCTIONS   Schedule regular health, dental, and eye exams.  Stay current with your immunizations.   Do not use any tobacco products including cigarettes, chewing tobacco, or electronic cigarettes.  If you are pregnant, do not drink alcohol.  If you are breastfeeding, limit how much and how often you drink alcohol.  Limit alcohol intake to no more than 1 drink per day for nonpregnant women. One drink equals 12 ounces of beer, 5 ounces of wine, or 1 ounces of hard liquor.  Do not use street  drugs.  Do not share needles.  Ask your health care provider for help if you need support or information about quitting drugs.  Tell your health care provider if you often feel depressed.  Tell your health care provider if you have ever been abused or do not feel safe at home. Document Released: 01/18/2011 Document Revised: 11/19/2013 Document Reviewed: 06/06/2013 [employer] Patient Information 2015 [employer]. This information is not intended to replace advice given to you by your health care provider. Make sure you discuss any questions you have with your health care provider.  

## 2014-08-01 ENCOUNTER — Ambulatory Visit (INDEPENDENT_AMBULATORY_CARE_PROVIDER_SITE_OTHER): Payer: MEDICARE | Admitting: Podiatry

## 2014-08-01 DIAGNOSIS — B351 Tinea unguium: Secondary | ICD-10-CM | POA: Diagnosis not present

## 2014-08-01 DIAGNOSIS — Q829 Congenital malformation of skin, unspecified: Secondary | ICD-10-CM | POA: Diagnosis not present

## 2014-08-01 DIAGNOSIS — M79673 Pain in unspecified foot: Secondary | ICD-10-CM | POA: Diagnosis not present

## 2014-08-01 NOTE — Progress Notes (Signed)
   Subjective:    Patient ID: [name], female    DOB: 11/22/1921, 81 y.o.   MRN: [id number]  HPI  Pt presents for nail debridement  Review of Systems     Objective:   Physical Exam: Pulses are strongly palpable bilateral. Porokeratosis are present plantar aspect of the bilateral foot. Nails are thick yellow dystrophic onychomycotic and painful palpation.        Assessment & Plan:  Assessment: Pain in limb secondary to onychomycosis 1 through 5 bilateral. Pain in limb secondary to porokeratotic lesions plantar aspect of the bilateral foot.  Plan: Debridement nails 1 through 5 bilateral is cover service secondary to pain. Debridement all reactive hyperkeratosis.

## 2014-08-30 DIAGNOSIS — Z853 Personal history of malignant neoplasm of breast: Secondary | ICD-10-CM | POA: Diagnosis not present

## 2014-08-30 DIAGNOSIS — R921 Mammographic calcification found on diagnostic imaging of breast: Secondary | ICD-10-CM | POA: Diagnosis not present

## 2014-08-30 DIAGNOSIS — Z0000 Encounter for general adult medical examination without abnormal findings: Secondary | ICD-10-CM | POA: Diagnosis not present

## 2014-08-30 LAB — HM MAMMOGRAPHY

## 2014-09-03 ENCOUNTER — Encounter: Payer: Self-pay | Admitting: Internal Medicine

## 2014-09-11 ENCOUNTER — Other Ambulatory Visit: Payer: Self-pay | Admitting: Radiology

## 2014-09-11 DIAGNOSIS — D0591 Unspecified type of carcinoma in situ of right breast: Secondary | ICD-10-CM | POA: Diagnosis not present

## 2014-09-11 DIAGNOSIS — Z0000 Encounter for general adult medical examination without abnormal findings: Secondary | ICD-10-CM | POA: Diagnosis not present

## 2014-09-11 DIAGNOSIS — D0511 Intraductal carcinoma in situ of right breast: Secondary | ICD-10-CM | POA: Diagnosis not present

## 2014-09-13 ENCOUNTER — Encounter: Payer: Self-pay | Admitting: *Deleted

## 2014-09-13 ENCOUNTER — Telehealth: Payer: Self-pay | Admitting: *Deleted

## 2014-09-13 DIAGNOSIS — Z170 Estrogen receptor positive status [ER+]: Secondary | ICD-10-CM | POA: Insufficient documentation

## 2014-09-13 DIAGNOSIS — C50411 Malignant neoplasm of upper-outer quadrant of right female breast: Secondary | ICD-10-CM

## 2014-09-13 HISTORY — DX: Malignant neoplasm of upper-outer quadrant of right female breast: C50.411

## 2014-09-13 NOTE — Telephone Encounter (Signed)
Confirmed [facility] for 09/18/14 at 1230 .  Instructions and contact information given.

## 2014-09-18 ENCOUNTER — Ambulatory Visit: Payer: MEDICARE

## 2014-09-18 ENCOUNTER — Ambulatory Visit (HOSPITAL_BASED_OUTPATIENT_CLINIC_OR_DEPARTMENT_OTHER): Payer: MEDICARE | Admitting: Oncology

## 2014-09-18 ENCOUNTER — Encounter: Payer: Self-pay | Admitting: Oncology

## 2014-09-18 ENCOUNTER — Ambulatory Visit
Admission: RE | Admit: 2014-09-18 | Discharge: 2014-09-18 | Disposition: A | Discharge status: Home / Self Care | Payer: MEDICARE | Source: Ambulatory Visit | Attending: Radiation Oncology | Admitting: Radiation Oncology

## 2014-09-18 ENCOUNTER — Other Ambulatory Visit (HOSPITAL_BASED_OUTPATIENT_CLINIC_OR_DEPARTMENT_OTHER): Payer: MEDICARE

## 2014-09-18 ENCOUNTER — Ambulatory Visit: Payer: MEDICARE | Admitting: Physical Therapy

## 2014-09-18 ENCOUNTER — Ambulatory Visit (INDEPENDENT_AMBULATORY_CARE_PROVIDER_SITE_OTHER): Payer: Self-pay | Admitting: Surgery

## 2014-09-18 VITALS — BP 156/43 | HR 79 | Temp 97.8°F | Resp 18 | Ht 62.0 in | Wt 172.8 lb

## 2014-09-18 DIAGNOSIS — Z853 Personal history of malignant neoplasm of breast: Secondary | ICD-10-CM | POA: Diagnosis not present

## 2014-09-18 DIAGNOSIS — M810 Age-related osteoporosis without current pathological fracture: Secondary | ICD-10-CM | POA: Diagnosis not present

## 2014-09-18 DIAGNOSIS — C50411 Malignant neoplasm of upper-outer quadrant of right female breast: Secondary | ICD-10-CM

## 2014-09-18 DIAGNOSIS — D0511 Intraductal carcinoma in situ of right breast: Secondary | ICD-10-CM | POA: Diagnosis not present

## 2014-09-18 LAB — CBC WITH DIFFERENTIAL/PLATELET
BASO%: 0.8 % (ref 0.0–2.0)
Basophils Absolute: 0 10*3/uL (ref 0.0–0.1)
EOS%: 0.5 % (ref 0.0–7.0)
Eosinophils Absolute: 0 10*3/uL (ref 0.0–0.5)
HCT: 38.5 % (ref 34.8–46.6)
HGB: 12.6 g/dL (ref 11.6–15.9)
LYMPH#: 1.2 10*3/uL (ref 0.9–3.3)
LYMPH%: 18.8 % (ref 14.0–49.7)
MCH: 30.1 pg (ref 25.1–34.0)
MCHC: 32.7 g/dL (ref 31.5–36.0)
MCV: 92 fL (ref 79.5–101.0)
MONO#: 0.4 10*3/uL (ref 0.1–0.9)
MONO%: 6 % (ref 0.0–14.0)
NEUT#: 4.6 10*3/uL (ref 1.5–6.5)
NEUT%: 73.9 % (ref 38.4–76.8)
Platelets: 199 10*3/uL (ref 145–400)
RBC: 4.19 10*6/uL (ref 3.70–5.45)
RDW: 13.4 % (ref 11.2–14.5)
WBC: 6.2 10*3/uL (ref 3.9–10.3)

## 2014-09-18 LAB — COMPREHENSIVE METABOLIC PANEL (CC13)
ALT: 8 U/L (ref 0–55)
AST: 14 U/L (ref 5–34)
Albumin: 3.8 g/dL (ref 3.5–5.0)
Alkaline Phosphatase: 100 U/L (ref 40–150)
Anion Gap: 12 mEq/L — ABNORMAL HIGH (ref 3–11)
BILIRUBIN TOTAL: 0.49 mg/dL (ref 0.20–1.20)
BUN: 20.1 mg/dL (ref 7.0–26.0)
CALCIUM: 9.1 mg/dL (ref 8.4–10.4)
CHLORIDE: 109 meq/L (ref 98–109)
CO2: 25 meq/L (ref 22–29)
CREATININE: 1.2 mg/dL — AB (ref 0.6–1.1)
EGFR: 40 mL/min/{1.73_m2} — ABNORMAL LOW (ref >90)
Glucose: 107 mg/dl (ref 70–140)
Potassium: 3.9 mEq/L (ref 3.5–5.1)
SODIUM: 145 meq/L (ref 136–145)
TOTAL PROTEIN: 6.8 g/dL (ref 6.4–8.3)

## 2014-09-18 NOTE — Progress Notes (Signed)
[place]  [phone] [phone]     ID: [name] DOB: [birth year]  MR#: [id number]  UGQ#:[id number]  Patient Care Team: Marletta Lor, MD as PCP - General Erroll Luna, MD as Consulting Physician (General Surgery) Chauncey Cruel, MD as Consulting Physician (Oncology) Rexene Edison, MD as Consulting Physician (Radiation Oncology) [facility], RN as Registered Nurse Roselee Culver, RN as Registered Nurse Trinda Pascal, NP as Nurse Practitioner (Nurse Practitioner) Jeryl Columbia, MD as Consulting Physician (Gastroenterology) OTHER MD:  CHIEF COMPLAINT: ductal carcinoma in situ, right breast  CURRENT TREATMENT: awaiting definitive surgery   BREAST CANCER HISTORY: I saw [name] previously for a history of bilateral wrist cancers, treated withbilateral lumpectomies and anti-estrogens for 5 years. She did not receive radiation treatments. She was last seen here in 2011.  She had her annual screening mammography at [facility] 08/27/2013, and this showed the breast density to be category B. There was an area of new grouped heterogeneous calcifications in the right breast at the 11:00 middle depth. A focal asymmetric density anterior to that area was seen in one view only. Additional views were recommended, but if they were performed I do not have those records. There appears to have been mammography on 03/05/2014, but again I do not have those records.  On 08/30/2014, bilateral diagnostic mammography with tomosynthesis this was obtained. The calcifications at the 11:00 position in the right breast were increased in number. There were no other significant findings. Biopsy of the area in question to 20 11/06/2014 showed ([id number]) high-grade ductal carcinoma in situ, with mucin extravasation.no definitive invasive component was seen however. This high-grade noninvasive tumor was estrogen receptor positive at 100%,  progesterone receptor positive at 44%, both with strong staining intensity.  Her subsequent history is as detailed below  INTERVAL HISTORY: [name] was evaluated in the multidisciplinary breast cancer clinic 09/18/2014 accompanied by her husband [name] and by their friend Judeen Hammans. Her case was also presented at the multidisciplinary breast cancer conference that same morning. The plan suggested at that meeting was that the patient undergo breast conservation surgery, with no sentinel lymph node sampling and no postoperative radiation. She would then go on an aromatase inhibitor.  REVIEW OF SYSTEMS: There were no specific symptoms leading to the recentl mammogram, which was scheduledas a follow-up.. The patient denies unusual headaches, visual changes, nausea, vomiting, stiff neck, dizziness, or gait imbalance. There has been no cough, phlegm production, or pleurisy, no chest pain or pressure, and no change in bowel or bladder habits. The patient denies fever, rash, bleeding, unexplained fatigue or unexplained weight loss. [name] is not in the exercise program, but she is very active at friends home [place], with many activities including putting on place and visiting "people who are sick". She walks about a quarter of a mildly time she has to go to the dining room.A detailed review of systems was otherwise entirely negative.  PAST MEDICAL HISTORY: Past Medical History  Diagnosis Date  . Allergic rhinitis, seasonal   . Osteoarthritis   . Menopausal syndrome   . HX: breast cancer     bilateral  . Osteoporosis   . Goiter     right thyroid nodule   . Squamous cell skin cancer     right lower leg  . Chronic venous insufficiency   . Dermatophytosis of nail   . Breast cancer of upper-outer quadrant of right female breast 09/13/2014    PAST SURGICAL HISTORY: Past  Surgical History  Procedure Laterality Date  . Left eardum sx  1983  . Cataract extraction    . Tonsillectomy    . Dilation and curettage  of uterus    . Umbilical hernia repair    . Bilateral lumpectomies for breast cancer  Aug. 2007  . Left knee arthroscopic surgery    . History of lumbar compression fracture    . Status post resection squamous cell cancer right lower leg    . No screening colonoscopy    . S/p bilateral lumpectomies    . Esophagogastroduodenoscopy  07/26/2011    Procedure: ESOPHAGOGASTRODUODENOSCOPY (EGD);  Surgeon: Jeryl Columbia, MD;  Location: [facility];  Service: Endoscopy;  Laterality: N/A;  . Savory dilation  07/26/2011    Procedure: SAVORY DILATION;  Surgeon: Jeryl Columbia, MD;  Location: [facility];  Service: Endoscopy;  Laterality: N/A;    FAMILY HISTORY Family History  Problem Relation Age of Onset  . Cancer Mother     breast  . Heart attack Father   . Heart attack Brother   . Cancer Maternal Aunt     breast  . Cancer Paternal Aunt     ovairan cancer  . Coronary artery disease Other   . Cancer Other     breast  . Heart attack Brother   . Heart disease Brother   the patient's father died at age 40 from a heart attack. The patient's mother died at age 32. The patient's mother was one of 12 sisters, 9 siblings. 2 of the sisters had breast cancer, postmenopausal. Pavneet Markwood self had 2 half-brothers and 1 full brother as well as one half-sister.There is no history of ovarian cancer in the familyexcept as noted.  GYNECOLOGIC HISTORY:  No LMP recorded. Patient is postmenopausal. Menarche age 81, the patient is GX P0. 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 They currently reside at friends home [place], with no pets.    ADVANCED DIRECTIVES: in place   HEALTH MAINTENANCE: History  Substance Use Topics  . Smoking status: Never Smoker   . Smokeless tobacco: Never Used  . Alcohol Use: No      Colonoscopy:  PAP:  Bone density:  Lipid panel:  Allergies  Allergen Reactions  . Celecoxib     REACTION: Swelling-leg    Current Outpatient Prescriptions  Medication Sig Dispense Refill  . Calcium Carb-Cholecalciferol (CALCIUM 600 + D PO) Take by mouth daily.    . fluticasone (FLONASE) 50 MCG/ACT nasal spray Place 2 sprays into the nose daily. 16 g 6  . furosemide (LASIX) 40 MG tablet Take 1 tablet (40 mg total) by mouth daily. 90 tablet 3  . pantoprazole (PROTONIX) 40 MG tablet Take 40 mg by mouth daily.     . cycloSPORINE (RESTASIS) 0.05 % ophthalmic emulsion Place 1 drop into both eyes 2 (two) times daily.    . fexofenadine (ALLEGRA) 180 MG tablet Take 1 tablet (180 mg total) by mouth daily. 60 tablet 3  . triamcinolone (KENALOG) 0.1 % cream Apply 1 application topically 2 (two) times daily.       No current facility-administered medications for this visit.    OBJECTIVE: elderly white woman who appears younger than stated age 81 Vitals:   09/18/14 1304  BP: 156/43  Pulse: 79  Temp: 97.8 F (36.6  C)  Resp: 18     Body mass index is 31.6 kg/(m^2).    ECOG FS:0 - Asymptomatic  Ocular: Sclerae unicteric, he EOMs intact Ear-nose-throat: Oropharynx clearand moist Lymphatic: No cervical or supraclavicular adenopathy Lungs no rales or rhonchi, good excursion bilaterally Heart regular rate and rhythm, no murmur appreciated Abd soft, nontender, positive bowel sounds MSK no focal spinal tenderness, no joint edema Neuro: non-focal, well-oriented, positive affect Breasts: the right breast is status post recent biopsy. I do not palpate a mass. There are no skin or nipple changes of concern. The right axilla is benign. The left breast is status post remote lumpectomy. There is no evidence of local recurrence. Left axilla is benign   LAB RESULTS:  CMP     Component Value Date/Time   NA 145 09/18/2014 1245   NA 143 06/25/2013 0948   K 3.9 09/18/2014 1245   K 4.4  06/25/2013 0948   CL 108 06/25/2013 0948   CO2 25 09/18/2014 1245   CO2 27 06/25/2013 0948   GLUCOSE 107 09/18/2014 1245   GLUCOSE 100* 06/25/2013 0948   BUN 20.1 09/18/2014 1245   BUN 21 06/25/2013 0948   CREATININE 1.2* 09/18/2014 1245   CREATININE 1.2 06/25/2013 0948   CALCIUM 9.1 09/18/2014 1245   CALCIUM 9.2 06/25/2013 0948   PROT 6.8 09/18/2014 1245   PROT 6.8 06/25/2013 0948   ALBUMIN 3.8 09/18/2014 1245   ALBUMIN 3.9 06/25/2013 0948   AST 14 09/18/2014 1245   AST 17 06/25/2013 0948   ALT 8 09/18/2014 1245   ALT 12 06/25/2013 0948   ALKPHOS 100 09/18/2014 1245   ALKPHOS 78 06/25/2013 0948   BILITOT 0.49 09/18/2014 1245   BILITOT 0.7 06/25/2013 0948   GFRNONAA 50.02 04/14/2010 1043    INo results found for: SPEP, UPEP  Lab Results  Component Value Date   WBC 6.2 09/18/2014   NEUTROABS 4.6 09/18/2014   HGB 12.6 09/18/2014   HCT 38.5 09/18/2014   MCV 92.0 09/18/2014   PLT 199 09/18/2014      Chemistry      Component Value Date/Time   NA 145 09/18/2014 1245   NA 143 06/25/2013 0948   K 3.9 09/18/2014 1245   K 4.4 06/25/2013 0948   CL 108 06/25/2013 0948   CO2 25 09/18/2014 1245   CO2 27 06/25/2013 0948   BUN 20.1 09/18/2014 1245   BUN 21 06/25/2013 0948   CREATININE 1.2* 09/18/2014 1245   CREATININE 1.2 06/25/2013 0948      Component Value Date/Time   CALCIUM 9.1 09/18/2014 1245   CALCIUM 9.2 06/25/2013 0948   ALKPHOS 100 09/18/2014 1245   ALKPHOS 78 06/25/2013 0948   AST 14 09/18/2014 1245   AST 17 06/25/2013 0948   ALT 8 09/18/2014 1245   ALT 12 06/25/2013 0948   BILITOT 0.49 09/18/2014 1245   BILITOT 0.7 06/25/2013 0948       Lab Results  Component Value Date   LABCA2 34 05/20/2011    No components found for: LPFXT024  No results for input(s): INR in the last 168 hours.  Urinalysis    Component Value Date/Time   COLORURINE yellow 10/28/2009 0905   APPEARANCEUR Clear 10/28/2009 0905   LABSPEC 1.025 10/28/2009 0905   PHURINE 5.0  10/28/2009 0905   HGBUR trace-intact 10/28/2009 0905   BILIRUBINUR negative 10/28/2009 0905   UROBILINOGEN 0.2 10/28/2009 0905   NITRITE negative 10/28/2009 0905    STUDIES: Outside films reviewed  ASSESSMENT: 81  y.o. [place] resident   (1) status post bilateral lumpectomies without sentinel lymph node biopsy in August 2007 for a right-sided 7 mm, grade 2, invasive ductal carcinoma, and a left-sided 8 mm, grade 1, invasive ductal carcinoma.  Both tumors were ER positive, HER-2/neu negative, both with a low proliferation fraction.   (a) On Arimidex from September 2007 until May 2009, at which time we switched to tamoxifen primarily secondary to concerns regarding osteoporosis, completing five years of antiestrogen September 2011.   (b)  Also received Zometa yearly, discontinued after 4 doses in 2011  (2)  Status post right breast upper outer quadrant biopsy 09/11/2014 4 a 4.2 cm area of ductal carcinoma in situ, grade 2 or 3,  with extravasated mucin, estrogen and progesterone receptor positive   (3) to undergo definitive surgery   (4) she will not need adjuvant radiation   (5) anastrozole to follow surgery  (a)  DEXA scan scheduled for early April 2016  PLAN: We spent the better part of today's hour-long appointment discussing the biology of breast cancer in general, and the specifics of the patient's tumor in particular.  Lorena understands if her breast cancer is indeed all in situ, then it would not be a life-threatening. The breast cancer cells in that case would be trapped in the ducts where they started and would not be able to travel to a vital organ.  Of course that does not mean she would not need surgery. She definitely does need to have this tumor removed and we are hoping she can accomplish this with a simple lumpectomy, with no sentinel lymph node sampling needed.  She will then return to see me to discuss results. We do not anticipate any for radiation or  chemotherapy. Likely she will benefit from anastrozole and without any mind I will set her up for a DEXA scan prior to her next visit with me. Given her history of osteoporosis in the past, she will likely benefit from denosumab and we will discuss that as well as at the next visit.  Incidentally, with a family history of ovarian cancer in and 3 breast cancers on the same side, she should undergo genetic testing. This is not so important for Ginamarie herself, but she is doing it for the sake of her family.  She has a good understanding of the overall plan. She agrees with it. She knows the goal of treatment in her case will be cure. She will call with any problems that may develop before her next visit here.  The patient has a good understanding of the overall plan. She agrees with it. She knows the goal of treatment in her case is cure. She will call with any problems that may develop before her next visit here.  Chauncey Cruel, MD   09/18/2014 5:53 PM Medical Oncology and Hematology [facility] [address] [place], [place] [address] Tel. [phone]    Fax. [phone]

## 2014-09-18 NOTE — H&P (Signed)
[name] 09/18/2014 8:18 AM Location: [facility] Patient #: [id number] DOB: Nov 11, 1921 Undefined / Language: [name] / Race: Undefined Female History of Present Illness [name] A. May Manrique MD; 09/18/2014 2:46 PM) Patient words: right breast DCIS    pt sent at the request of Dr Valere Dross for roght breast microcalcifications. Hx of DCIS in the past and has had previous lumpectomies but no radiation therapy. Has taken AI in the past. she has no complaints. no masses or nipple discharge      ADDITIONAL INFORMATION: PROGNOSTIC INDICATORS - ACIS Results: IMMUNOHISTOCHEMICAL AND MORPHOMETRIC ANALYSIS BY THE AUTOMATED CELLULAR IMAGING SYSTEM (ACIS) Estrogen Receptor: 100%, POSITIVE, STRONG STAINING INTENSITY Progesterone Receptor: 44%, POSITIVE, STRONG STAINING INTENSITY REFERENCE RANGE ESTROGEN RECEPTOR NEGATIVE <1% POSITIVE =>1% PROGESTERONE RECEPTOR NEGATIVE <1% POSITIVE =>1% All controls stained appropriately Taylor Cutter MD Pathologist, Electronic Signature ( Signed 09/18/2014) FINAL DIAGNOSIS Diagnosis Breast, right, needle core biopsy - HIGH GRADE DUCTAL CARCINOMA IN SITU WITH CALCIFICATIONS AND NECROSIS. - FOCAL ACELLULAR MUCIN IS PRESENT WITHIN THE ADJACENT STROMA. - SEE COMMENT. 1 of 2 FINAL for Taylor, Marks [phone]) Microscopic Comment The focal acellular mucin present in the adjacent stroma raises the possibility of concomitant invasive carcinoma. However, malignant epithelium is not identified to confirm this possibility. Deeper levels are cut and examined. Ultimately, this possibility is best assessed on excision specimen. Quantitative estrogen receptor and progesterone receptor studies will be performed and reported in an addendum to follow. The findings are called to [facility] on 09/13/2014. Dr. Donato Heinz has seen this case in consultation with agreement. (RH:kh 09/13/14) Taylor Niece MD Pathologist, Electronic  Signature (Case signed 09/13/2014) Specimen Gross and Clinical Information Specimen Comment In formalin 2:25; calco-moderate /high suspicion of DCIS Specimen(s) Obtained: Breast, right, needle core biopsy Gross Received in formalin are 10 cores of soft, tan-red tissue which measure1.0 x 0.3 x 0.3 cm and up to 2.5 x 0.5 x 0.5 cm. Submitted in toto in 2 block(s) sj A= 5 cores B= 5 cores Time in.  The patient is a 81 year old female   Other Problems [name] [place], [place]; 09/18/2014 8:19 AM) Arthritis Breast Cancer Cancer Gastroesophageal Reflux Disease Hemorrhoids Lump In Breast Thyroid Disease  Past Surgical History [name], RMA; 09/18/2014 8:19 AM) Breast Mass; Local Excision Bilateral. Cataract Surgery Bilateral. Esophagomyotomy Knee Surgery Left. Oral Surgery Tonsillectomy Ventral / Umbilical Hernia Surgery Bilateral.  Diagnostic Studies History [name] [place], [place]; 09/18/2014 8:19 AM) Colonoscopy never Mammogram within last year Pap Smear >5 years ago  Social History [name] [place], RMA; 09/18/2014 8:19 AM) Alcohol use Occasional alcohol use. Caffeine use Tea. No drug use Tobacco use Never smoker.  Family History [name] [place], [place]; 09/18/2014 8:19 AM) Arthritis Family Members In General. Breast Cancer Family Members In General, Mother. Cerebrovascular Accident Family Members In General. Heart Disease Father. Ovarian Cancer Family Members In General. Respiratory Condition Family Members In General.  Pregnancy / Birth History [name], [place]; 09/18/2014 8:19 AM) Age at menarche 15 years. Irregular periods     Review of Systems [name] [place] RMA; 09/18/2014 8:19 AM) General Not Present- Appetite Loss, Chills, Fatigue, Fever, Night Sweats, Weight Gain and Weight Loss. Skin Not Present- Change in Wart/Mole, Dryness, Hives, Jaundice, New Lesions, Non-Healing Wounds, Rash and Ulcer. HEENT Present- Hearing Loss, Seasonal  Allergies and Wears glasses/contact lenses. Not Present- Earache, Hoarseness, Nose Bleed, Oral Ulcers, Ringing in the Ears, Sinus Pain, Sore Throat, Visual Disturbances and Yellow Eyes. Respiratory Not Present- Bloody sputum, Chronic Cough, Difficulty Breathing, Snoring  and Wheezing. Breast Not Present- Breast Mass, Breast Pain, Nipple Discharge and Skin Changes. Cardiovascular Present- Leg Cramps and Swelling of Extremities. Not Present- Chest Pain, Difficulty Breathing Lying Down, Palpitations, Rapid Heart Rate and Shortness of Breath. Gastrointestinal Present- Constipation and Hemorrhoids. Not Present- Abdominal Pain, Bloating, Bloody Stool, Change in Bowel Habits, Chronic diarrhea, Difficulty Swallowing, Excessive gas, Gets full quickly at meals, Indigestion, Nausea, Rectal Pain and Vomiting. Female Genitourinary Not Present- Frequency, Nocturia, Painful Urination, Pelvic Pain and Urgency. Neurological Not Present- Decreased Memory, Fainting, Headaches, Numbness, Seizures, Tingling, Tremor, Trouble walking and Weakness. Psychiatric Not Present- Anxiety, Bipolar, Change in Sleep Pattern, Depression, Fearful and Frequent crying. Endocrine Not Present- Cold Intolerance, Excessive Hunger, Hair Changes, Heat Intolerance, Hot flashes and New Diabetes. Hematology Not Present- Easy Bruising, Excessive bleeding, Gland problems, HIV and Persistent Infections.   Physical Exam ([name] [name]; 09/18/2014 2:47 PM)  General Mental Status-Alert. General Appearance-Consistent with stated age. Hydration-Well hydrated. Voice-Normal.  Head and Neck Head-normocephalic, atraumatic with no lesions or palpable masses. Trachea-midline. Thyroid Gland Characteristics - normal size and consistency.  Eye Eyeball - Bilateral-Extraocular movements intact. Sclera/Conjunctiva - Bilateral-No scleral icterus.  Chest and Lung Exam Chest and lung exam reveals -quiet, even and easy respiratory  effort with no use of accessory muscles and on auscultation, normal breath sounds, no adventitious sounds and normal vocal resonance. Inspection Chest Wall - Normal. Back - normal.  Breast Breast - Left-Symmetric, Non Tender, No Biopsy scars, no Dimpling, No Inflammation, No Lumpectomy scars, No Mastectomy scars, No Peau d' Orange. Breast - Right-Symmetric, Non Tender, No Biopsy scars, no Dimpling, No Inflammation, No Lumpectomy scars, No Mastectomy scars, No Peau d' Orange. Breast Lump-No Palpable Breast Mass. Note: bilateral well healed breast scars   Cardiovascular Cardiovascular examination reveals -normal heart sounds, regular rate and rhythm with no murmurs and normal pedal pulses bilaterally.  Abdomen Inspection Inspection of the abdomen reveals - No Hernias. Skin - Scar - no surgical scars. Palpation/Percussion Palpation and Percussion of the abdomen reveal - Soft, Non Tender, No Rebound tenderness, No Rigidity (guarding) and No hepatosplenomegaly. Auscultation Auscultation of the abdomen reveals - Bowel sounds normal.  Neurologic Neurologic evaluation reveals -alert and oriented x 3 with no impairment of recent or remote memory. Mental Status-Normal.  Musculoskeletal Normal Exam - Left-Upper Extremity Strength Normal and Lower Extremity Strength Normal. Normal Exam - Right-Upper Extremity Strength Normal and Lower Extremity Strength Normal.  Lymphatic Head & Neck  General Head & Neck Lymphatics: Bilateral - Description - Normal. Axillary  General Axillary Region: Bilateral - Description - Normal. Tenderness - Non Tender. Femoral & Inguinal  Generalized Femoral & Inguinal Lymphatics: Bilateral - Description - Normal. Tenderness - Non Tender.    Assessment & Plan (Marchetta Navratil A. Jovoni Borkenhagen MD; 09/18/2014 2:48 PM)  DCIS (DUCTAL CARCINOMA IN SITU), RIGHT (233.0  D05.11) Impression: PT good candidate for right breast seed localized lumpectomy. Risk of  lumpectomy include bleeding, infection, seroma, more surgery, use of seed/wire, wound care, cosmetic deformity and the need for other treatments, death , blood clots, death. Pt agrees to proceed.  Current Plans Pt Education - [facility] Breast Biopsy HCI

## 2014-09-18 NOTE — Progress Notes (Signed)
[name] is a very pleasant 81 y.o. female from [place], [place] with newly diagnosed grade 3 DCIS of the right breast.  Biopsy results revealed the tumor's hormone status as ER positive and PR positive.  She presents today with her husband and friend to the [facility]) for treatment consideration and recommendations from the breast surgeon, radiation oncologist, and medical oncologist.     I briefly met with [name] and her family during her [facility] visit today. We discussed the purpose of the Survivorship Clinic, which will include monitoring for recurrence, coordinating completion of age and gender-appropriate cancer screenings, promotion of overall wellness, as well as managing potential late/long-term side effects of anti-cancer treatments.    The treatment plan for [name] will likely include surgery and anti-estrogen therapy.  She will meet with the Genetics Counselor due to her personal history of breast cancer. As of today, the intent of treatment for [name] is cure, therefore she will be eligible for the Survivorship Clinic upon her completion of treatment.  Her survivorship care plan (SCP) document will be drafted and updated throughout the course of her treatment trajectory. She will receive the SCP in an office visit with myself in the Survivorship Clinic once she has completed treatment.   [name] was encouraged to ask questions and all questions were answered to her satisfaction.  She was given my business card and encouraged to contact me with any concerns regarding survivorship.  I look forward to participating in her care.   Mike Craze, NP [place] [phone]

## 2014-09-18 NOTE — Progress Notes (Signed)
Checked in new pt with no financial concerns prior to seeing the dr. [name] has 2 insurances so financial assistance may not be needed but she has Raquel's card for any billing questions or concerns.

## 2014-09-19 ENCOUNTER — Other Ambulatory Visit: Payer: MEDICARE

## 2014-09-19 ENCOUNTER — Encounter: Payer: Self-pay | Admitting: Genetic Counselor

## 2014-09-19 ENCOUNTER — Ambulatory Visit (HOSPITAL_BASED_OUTPATIENT_CLINIC_OR_DEPARTMENT_OTHER): Payer: MEDICARE | Admitting: Genetic Counselor

## 2014-09-19 DIAGNOSIS — C50411 Malignant neoplasm of upper-outer quadrant of right female breast: Secondary | ICD-10-CM

## 2014-09-19 DIAGNOSIS — Z315 Encounter for genetic counseling: Secondary | ICD-10-CM

## 2014-09-19 DIAGNOSIS — Z853 Personal history of malignant neoplasm of breast: Secondary | ICD-10-CM

## 2014-09-19 DIAGNOSIS — Z803 Family history of malignant neoplasm of breast: Secondary | ICD-10-CM | POA: Insufficient documentation

## 2014-09-19 DIAGNOSIS — D0511 Intraductal carcinoma in situ of right breast: Secondary | ICD-10-CM | POA: Diagnosis not present

## 2014-09-19 DIAGNOSIS — Z8041 Family history of malignant neoplasm of ovary: Secondary | ICD-10-CM | POA: Insufficient documentation

## 2014-09-19 NOTE — Progress Notes (Signed)
Patient Name: [name] Patient Age: 81 y.o. Encounter Date: 09/19/2014  Referring Physician: Lurline Del, MD  Primary Care Provider: Nyoka Cowden, MD   [name], a 81 y.o. female, is being seen at the [facility] due to a personal and family history of breast cancer. She presents to clinic today to discuss the possibility of a hereditary predisposition to cancer and discuss whether genetic testing is warranted.  HISTORY OF PRESENT ILLNESS: [name] was recently diagnosed with right breast cancer (DCIS) at the age of 35. She stated that she will be pursuing lumpectomy.  The breast tumor was ER positive and PR positive.  In 2007, she was diagnosed with bilateral breast cancers. Both tumors were ER/PR positive and HER2 negatives. She had lumpectomies and no radiation.  [name] has a history of squamous skin cancer on her right lower leg.    Breast cancer of upper-outer quadrant of right female breast   09/11/2014 Pathology Results HIGH GRADE DUCTAL CARCINOMA IN SITU WITH CALCIFICATIONS AND NECROSIS. - FOCAL ACELLULAR MUCIN IS PRESENT WITHIN THE ADJACENT STROMA   09/13/2014 Initial Diagnosis Breast cancer of upper-outer quadrant of right female breast    Past Medical History  Diagnosis Date  . Allergic rhinitis, seasonal   . Osteoarthritis   . Menopausal syndrome   . HX: breast cancer     bilateral  . Osteoporosis   . Goiter     right thyroid nodule   . Squamous cell skin cancer     right lower leg  . Chronic venous insufficiency   . Dermatophytosis of nail   . Breast cancer of upper-outer quadrant of right female breast 09/13/2014  . Family history of malignant neoplasm of breast   . Family history of malignant neoplasm of ovary     Past Surgical History  Procedure Laterality Date  . Left eardum sx  1983  . Cataract extraction    . Tonsillectomy    . Dilation and curettage of uterus    . Umbilical hernia repair    .  Bilateral lumpectomies for breast cancer  Aug. 2007  . Left knee arthroscopic surgery    . History of lumbar compression fracture    . Status post resection squamous cell cancer right lower leg    . No screening colonoscopy    . S/p bilateral lumpectomies    . Esophagogastroduodenoscopy  07/26/2011    Procedure: ESOPHAGOGASTRODUODENOSCOPY (EGD);  Surgeon: Jeryl Columbia, MD;  Location: [facility];  Service: Endoscopy;  Laterality: N/A;  . Savory dilation  07/26/2011    Procedure: SAVORY DILATION;  Surgeon: Jeryl Columbia, MD;  Location: [facility];  Service: Endoscopy;  Laterality: N/A;    History   Social History  . Marital Status: Married    Spouse Name: N/A  . Number of Children: N/A  . Years of Education: N/A   Social History Main Topics  . Smoking status: Never Smoker   . Smokeless tobacco: Never Used  . Alcohol Use: No  . Drug Use: No  . Sexual Activity: Not on file   Other Topics Concern  . Not on file   Social History Narrative     FAMILY HISTORY:   During the visit, a 4-generation pedigree was obtained. Family tree will be sent for scanning and will be in EPIC under the Media tab.  Significant diagnoses include the following:  Family History  Problem Relation Age of Onset  . Cancer Mother 35  breast  . Heart attack Father   . Heart attack Brother   . Cancer Maternal Aunt 55    breast  . Coronary artery disease Other   . Cancer Other     ovarian; daughter of mat aunt w/ BC in 10s  . Heart attack Brother   . Heart disease Brother   . Cancer Maternal Aunt 60    breast    Additionally, [name] has no children. She has one full brother (age 64) who has 3 sons. She had 3 paternal half-brothers and a paternal half-sisters who are all deceased; cancer-free. She does not recall of any paternal relatives with cancers. Her mother had 7 sisters and a brother. Two sisters (noted above) had breast cancer and one had a daughter with ovarian cancer.  Ms. Glennie  ancestry is [place]. There is no known Jewish ancestry and no consanguinity.  ASSESSMENT AND PLAN: [name] is a 81 y.o. female with a personal history of bilateral breast cancer and family history of breast and ovarian cancers. Given the ages of onset of the breast cancers in her family, there is not a high likelihood of her having a hereditary predisposition. Genetic testing, however, is recommended as it could have significant impact on her distant relatives. She meets Medicare's criteria for testing. We reviewed the characteristics, features and inheritance patterns of hereditary cancer syndromes. We also discussed genetic testing, including the process of testing, insurance coverage and implications of results. A negative result will be reassuring.  [name] wished to pursue genetic testing and a blood sample will be sent to [facility] for analysis of the 17 genes on the BreastNext gene panel (ATM, BARD1, BRCA1, BRCA2, BRIP1, CDH1, CHEK2, MRE11A, MUTYH, NBN, NF1, PALB2, PTEN, RAD50, RAD51C, RAD51D, and TP53). We discussed the implications of a positive, negative and/ or Variant of Uncertain Significance (VUS) result. Results should be available in approximately 4-5 weeks, at which point we will contact her and address implications for her as well as address genetic testing for at-risk family members, if needed.    We encouraged [name] to remain in contact with Cancer Genetics annually so that we can update the family history and inform her of any changes in cancer genetics and testing that may be of benefit for this family. [name] questions were answered to her satisfaction today.   Thank you for the referral and allowing [place] to share in the care of your patient.   The patient was seen for a total of 25 minutes, greater than 50% of which was spent face-to-face counseling. This patient was discussed with the overseeing provider who agrees with the above.   Steele Berg, MS,  [place] Certified Genetic Counselor phone: [phone] [email or web address]

## 2014-09-24 ENCOUNTER — Ambulatory Visit (INDEPENDENT_AMBULATORY_CARE_PROVIDER_SITE_OTHER): Payer: Self-pay | Admitting: Surgery

## 2014-09-24 ENCOUNTER — Encounter (HOSPITAL_BASED_OUTPATIENT_CLINIC_OR_DEPARTMENT_OTHER): Payer: Self-pay | Admitting: *Deleted

## 2014-09-24 ENCOUNTER — Telehealth: Payer: Self-pay | Admitting: *Deleted

## 2014-09-24 DIAGNOSIS — D0510 Intraductal carcinoma in situ of unspecified breast: Secondary | ICD-10-CM

## 2014-09-24 NOTE — Telephone Encounter (Signed)
Spoke to pt husband. Relate they did not have any questions or concerns regarding dx or treatment care plan. Informed that [facility] will call with a surgery date and pt will see Dr. Jana Hakim afterwards. Received verbal understanding. Encourage to call with needs. Contact information given.

## 2014-09-24 NOTE — Progress Notes (Signed)
Pt and husband live independent living friends home-both drive-sharp-labs just done 09/18/14 No htn, no cardiac or resp problems

## 2014-09-27 ENCOUNTER — Encounter: Payer: Self-pay | Admitting: Internal Medicine

## 2014-09-30 DIAGNOSIS — Z700 Counseling related to sexual attitude: Secondary | ICD-10-CM | POA: Diagnosis not present

## 2014-09-30 DIAGNOSIS — C50919 Malignant neoplasm of unspecified site of unspecified female breast: Secondary | ICD-10-CM | POA: Diagnosis not present

## 2014-09-30 DIAGNOSIS — Z0000 Encounter for general adult medical examination without abnormal findings: Secondary | ICD-10-CM | POA: Diagnosis not present

## 2014-10-01 ENCOUNTER — Ambulatory Visit (HOSPITAL_BASED_OUTPATIENT_CLINIC_OR_DEPARTMENT_OTHER): Payer: MEDICARE | Admitting: Certified Registered"

## 2014-10-01 ENCOUNTER — Ambulatory Visit (HOSPITAL_BASED_OUTPATIENT_CLINIC_OR_DEPARTMENT_OTHER)
Admission: RE | Admit: 2014-10-01 | Discharge: 2014-10-01 | Disposition: A | Discharge status: Home / Self Care | Payer: MEDICARE | Source: Ambulatory Visit | Attending: Surgery | Admitting: Surgery

## 2014-10-01 ENCOUNTER — Encounter (HOSPITAL_BASED_OUTPATIENT_CLINIC_OR_DEPARTMENT_OTHER)
Admission: RE | Disposition: A | Discharge status: Home / Self Care | Payer: Self-pay | Source: Ambulatory Visit | Attending: Surgery

## 2014-10-01 ENCOUNTER — Encounter (HOSPITAL_BASED_OUTPATIENT_CLINIC_OR_DEPARTMENT_OTHER): Payer: Self-pay

## 2014-10-01 DIAGNOSIS — E079 Disorder of thyroid, unspecified: Secondary | ICD-10-CM | POA: Insufficient documentation

## 2014-10-01 DIAGNOSIS — M1990 Unspecified osteoarthritis, unspecified site: Secondary | ICD-10-CM | POA: Diagnosis not present

## 2014-10-01 DIAGNOSIS — C50911 Malignant neoplasm of unspecified site of right female breast: Secondary | ICD-10-CM | POA: Insufficient documentation

## 2014-10-01 DIAGNOSIS — K219 Gastro-esophageal reflux disease without esophagitis: Secondary | ICD-10-CM | POA: Diagnosis not present

## 2014-10-01 DIAGNOSIS — R921 Mammographic calcification found on diagnostic imaging of breast: Secondary | ICD-10-CM | POA: Diagnosis not present

## 2014-10-01 DIAGNOSIS — Z0000 Encounter for general adult medical examination without abnormal findings: Secondary | ICD-10-CM | POA: Diagnosis not present

## 2014-10-01 DIAGNOSIS — Z170 Estrogen receptor positive status [ER+]: Secondary | ICD-10-CM | POA: Insufficient documentation

## 2014-10-01 DIAGNOSIS — D0511 Intraductal carcinoma in situ of right breast: Secondary | ICD-10-CM | POA: Diagnosis not present

## 2014-10-01 HISTORY — DX: Presence of spectacles and contact lenses: Z97.3

## 2014-10-01 HISTORY — PX: BREAST LUMPECTOMY WITH RADIOACTIVE SEED LOCALIZATION: SHX6424

## 2014-10-01 HISTORY — DX: Localized edema: R60.0

## 2014-10-01 HISTORY — DX: Presence of external hearing-aid: Z97.4

## 2014-10-01 SURGERY — BREAST LUMPECTOMY WITH RADIOACTIVE SEED LOCALIZATION
Anesthesia: General | Site: Breast | Laterality: Right

## 2014-10-01 MED ORDER — CEFAZOLIN SODIUM-DEXTROSE 2-3 GM-% IV SOLR
INTRAVENOUS | Status: AC
  Filled 2014-10-01: qty 50

## 2014-10-01 MED ORDER — ONDANSETRON HCL 4 MG/2ML IJ SOLN: 4.0000 mg | Freq: Four times a day (QID) | INTRAMUSCULAR | Status: DC | PRN

## 2014-10-01 MED ORDER — FENTANYL CITRATE 0.05 MG/ML IJ SOLN
INTRAMUSCULAR | Status: AC
  Filled 2014-10-01: qty 2

## 2014-10-01 MED ORDER — MIDAZOLAM HCL 2 MG/2ML IJ SOLN: 1.0000 mg | INTRAMUSCULAR | Status: DC | PRN

## 2014-10-01 MED ORDER — BUPIVACAINE-EPINEPHRINE (PF) 0.25% -1:200000 IJ SOLN
INTRAMUSCULAR | Status: DC | PRN
  Administered 2014-10-01: 20 mL

## 2014-10-01 MED ORDER — PROPOFOL 10 MG/ML IV BOLUS
INTRAVENOUS | Status: DC | PRN
  Administered 2014-10-01: 150 mg via INTRAVENOUS

## 2014-10-01 MED ORDER — CEFAZOLIN SODIUM-DEXTROSE 2-3 GM-% IV SOLR
2.0000 g | INTRAVENOUS | Status: AC
  Administered 2014-10-01: 2 g via INTRAVENOUS

## 2014-10-01 MED ORDER — OXYCODONE HCL 5 MG/5ML PO SOLN: 5.0000 mg | Freq: Once | ORAL | Status: DC | PRN

## 2014-10-01 MED ORDER — FENTANYL CITRATE 0.05 MG/ML IJ SOLN: 50.0000 ug | INTRAMUSCULAR | Status: DC | PRN

## 2014-10-01 MED ORDER — EPHEDRINE SULFATE 50 MG/ML IJ SOLN
INTRAMUSCULAR | Status: DC | PRN
  Administered 2014-10-01: 10 mg via INTRAVENOUS

## 2014-10-01 MED ORDER — CHLORHEXIDINE GLUCONATE 4 % EX LIQD: 1.0000 "application " | Freq: Once | CUTANEOUS | Status: DC

## 2014-10-01 MED ORDER — LACTATED RINGERS IV SOLN
INTRAVENOUS | Status: DC
  Administered 2014-10-01 (×2): via INTRAVENOUS

## 2014-10-01 MED ORDER — OXYCODONE HCL 5 MG PO TABS: 5.0000 mg | ORAL_TABLET | Freq: Once | ORAL | Status: DC | PRN

## 2014-10-01 MED ORDER — GLYCOPYRROLATE 0.2 MG/ML IJ SOLN
INTRAMUSCULAR | Status: DC | PRN
  Administered 2014-10-01: 0.2 mg via INTRAVENOUS

## 2014-10-01 MED ORDER — HYDROCODONE-ACETAMINOPHEN 5-325 MG PO TABS: 1.0000 | ORAL_TABLET | Freq: Four times a day (QID) | ORAL | Status: DC | PRN

## 2014-10-01 MED ORDER — FENTANYL CITRATE 0.05 MG/ML IJ SOLN
INTRAMUSCULAR | Status: AC
  Filled 2014-10-01: qty 4

## 2014-10-01 MED ORDER — LIDOCAINE HCL (CARDIAC) 20 MG/ML IV SOLN
INTRAVENOUS | Status: DC | PRN
  Administered 2014-10-01: 60 mg via INTRAVENOUS

## 2014-10-01 MED ORDER — DEXAMETHASONE SODIUM PHOSPHATE 4 MG/ML IJ SOLN
INTRAMUSCULAR | Status: DC | PRN
  Administered 2014-10-01: 5 mg via INTRAVENOUS

## 2014-10-01 MED ORDER — FENTANYL CITRATE 0.05 MG/ML IJ SOLN
INTRAMUSCULAR | Status: DC | PRN
  Administered 2014-10-01: 50 ug via INTRAVENOUS

## 2014-10-01 MED ORDER — FENTANYL CITRATE 0.05 MG/ML IJ SOLN
25.0000 ug | INTRAMUSCULAR | Status: DC | PRN
  Administered 2014-10-01 (×2): 25 ug via INTRAVENOUS

## 2014-10-01 MED ORDER — ONDANSETRON HCL 4 MG/2ML IJ SOLN
INTRAMUSCULAR | Status: DC | PRN
  Administered 2014-10-01: 4 mg via INTRAVENOUS

## 2014-10-01 SURGICAL SUPPLY — 51 items
APPLIER CLIP 9.375 MED OPEN (MISCELLANEOUS)
APR CLP MED 9.3 20 MLT OPN (MISCELLANEOUS)
BINDER BREAST LRG (GAUZE/BANDAGES/DRESSINGS) ×1 IMPLANT
BINDER BREAST MEDIUM (GAUZE/BANDAGES/DRESSINGS) IMPLANT
BINDER BREAST XLRG (GAUZE/BANDAGES/DRESSINGS) IMPLANT
BINDER BREAST XXLRG (GAUZE/BANDAGES/DRESSINGS) IMPLANT
BLADE SURG 15 STRL LF DISP TIS (BLADE) ×1 IMPLANT
BLADE SURG 15 STRL SS (BLADE) ×2
CANISTER SUC SOCK COL 7IN (MISCELLANEOUS) ×1 IMPLANT
CANISTER SUCT 1200ML W/VALVE (MISCELLANEOUS) IMPLANT
CHLORAPREP W/TINT 26ML (MISCELLANEOUS) ×2 IMPLANT
CLIP APPLIE 9.375 MED OPEN (MISCELLANEOUS) IMPLANT
CLIP TI WIDE RED SMALL 6 (CLIP) ×1 IMPLANT
COVER BACK TABLE 60X90IN (DRAPES) ×2 IMPLANT
COVER MAYO STAND STRL (DRAPES) ×2 IMPLANT
COVER PROBE W GEL 5X96 (DRAPES) ×2 IMPLANT
DECANTER SPIKE VIAL GLASS SM (MISCELLANEOUS) IMPLANT
DEVICE DUBIN W/COMP PLATE 8390 (MISCELLANEOUS) ×2 IMPLANT
DRAPE LAPAROSCOPIC ABDOMINAL (DRAPES) IMPLANT
DRAPE LAPAROTOMY 100X72 PEDS (DRAPES) ×2 IMPLANT
DRAPE UTILITY XL STRL (DRAPES) ×3 IMPLANT
ELECT COATED BLADE 2.86 ST (ELECTRODE) ×2 IMPLANT
ELECT REM PT RETURN 9FT ADLT (ELECTROSURGICAL) ×2
ELECTRODE REM PT RTRN 9FT ADLT (ELECTROSURGICAL) ×1 IMPLANT
GLOVE BIO SURGEON STRL SZ 6.5 (GLOVE) ×1 IMPLANT
GLOVE BIOGEL PI IND STRL 7.5 (GLOVE) IMPLANT
GLOVE BIOGEL PI IND STRL 8 (GLOVE) ×1 IMPLANT
GLOVE BIOGEL PI INDICATOR 7.5 (GLOVE) ×1
GLOVE BIOGEL PI INDICATOR 8 (GLOVE) ×1
GLOVE ECLIPSE 8.0 STRL XLNG CF (GLOVE) ×2 IMPLANT
GLOVE EXAM NITRILE LRG STRL (GLOVE) ×1 IMPLANT
GOWN STRL REUS W/ TWL LRG LVL3 (GOWN DISPOSABLE) ×2 IMPLANT
GOWN STRL REUS W/TWL LRG LVL3 (GOWN DISPOSABLE) ×4
HEMOSTAT SNOW SURGICEL 2X4 (HEMOSTASIS) IMPLANT
KIT MARKER MARGIN INK (KITS) ×2 IMPLANT
LIQUID BAND (GAUZE/BANDAGES/DRESSINGS) ×2 IMPLANT
NDL HYPO 25X1 1.5 SAFETY (NEEDLE) ×1 IMPLANT
NEEDLE HYPO 25X1 1.5 SAFETY (NEEDLE) ×2 IMPLANT
NS IRRIG 1000ML POUR BTL (IV SOLUTION) ×2 IMPLANT
PACK BASIN DAY SURGERY FS (CUSTOM PROCEDURE TRAY) ×2 IMPLANT
PENCIL BUTTON HOLSTER BLD 10FT (ELECTRODE) ×2 IMPLANT
SLEEVE SCD COMPRESS KNEE MED (MISCELLANEOUS) ×2 IMPLANT
SPONGE LAP 4X18 X RAY DECT (DISPOSABLE) ×2 IMPLANT
SUT MNCRL AB 4-0 PS2 18 (SUTURE) ×2 IMPLANT
SUT SILK 2 0 SH (SUTURE) IMPLANT
SUT VICRYL 3-0 CR8 SH (SUTURE) ×2 IMPLANT
SYR CONTROL 10ML LL (SYRINGE) ×2 IMPLANT
TOWEL OR 17X24 6PK STRL BLUE (TOWEL DISPOSABLE) ×2 IMPLANT
TOWEL OR NON WOVEN STRL DISP B (DISPOSABLE) ×2 IMPLANT
TUBE CONNECTING 20X1/4 (TUBING) IMPLANT
YANKAUER SUCT BULB TIP NO VENT (SUCTIONS) IMPLANT

## 2014-10-01 NOTE — Interval H&P Note (Signed)
History and Physical Interval Note:  10/01/2014 2:43 PM  [name]  has presented today for surgery, with the diagnosis of DCIS Right Breast  The various methods of treatment have been discussed with the patient and family. After consideration of risks, benefits and other options for treatment, the patient has consented to  Procedure(s): BREAST LUMPECTOMY WITH RADIOACTIVE SEED LOCALIZATION (Right) as a surgical intervention .  The patient's history has been reviewed, patient examined, no change in status, stable for surgery.  I have reviewed the patient's chart and labs.  Questions were answered to the patient's satisfaction.     Tashaya Ancrum A.

## 2014-10-01 NOTE — Anesthesia Preprocedure Evaluation (Addendum)
Anesthesia Evaluation  Patient identified by MRN, date of birth, ID band Patient awake    Reviewed: Allergy & Precautions, NPO status , Patient's Chart, lab work & pertinent test results  Airway Mallampati: II   Neck ROM: full    Dental   Pulmonary neg pulmonary ROS,          Cardiovascular + Peripheral Vascular Disease     Neuro/Psych    GI/Hepatic   Endo/Other  obese  Renal/GU      Musculoskeletal  (+) Arthritis -, Osteoarthritis,    Abdominal   Peds  Hematology   Anesthesia Other Findings   Reproductive/Obstetrics Breast CA                           Anesthesia Physical Anesthesia Plan  ASA: II  Anesthesia Plan: General   Post-op Pain Management:    Induction: Intravenous  Airway Management Planned: LMA  Additional Equipment:   Intra-op Plan:   Post-operative Plan:   Informed Consent: I have reviewed the patients History and Physical, chart, labs and discussed the procedure including the risks, benefits and alternatives for the proposed anesthesia with the patient or authorized representative who has indicated his/her understanding and acceptance.     Plan Discussed with: CRNA, Anesthesiologist and Surgeon  Anesthesia Plan Comments:         Anesthesia Quick Evaluation

## 2014-10-01 NOTE — Op Note (Signed)
Preoperative diagnosis: right  breast DCIS   Postoperative diagnosis: Same   Procedure: RIGHT  breast seed localized lumpectomy  Surgeon: Erroll Luna M.D.  Anesthesia: Gen. With 0.25% Sensorcaine local WITH EPINEPHRINE  EBL: 20 cc  Specimen: RIGHT  breast tissue with clip and radioactive seed in the specimen. Verified with neoprobe and radiographic image showing both seed and clip in specimen  Indications for procedure: The patient presents for RIGHT breastlumpectomy after core biopsy showed DCIS. Discussed the rationale for considering excision. risk of malignancy associated with DCIS  Discussed. . Discussed observation. Discussed wire localization and seed localization. Patient desired lumpectomy.The procedure has been discussed with the patient. Alternatives to surgery have been discussed with the patient.  Risks of surgery include bleeding,  Infection,  Seroma formation, death,  and the need for further surgery.   The patient understands and wishes to proceed.   Description of procedure: Patient underwent seed placement as an outpatient. Patient presents today for right  breast seed localized lumpectomy. Patient seen in  holding area. Questions are answered and neoprobe used to verify seed location. Patient taken back to the operating room and placed upon the OR table. After induction of general anesthesia, right breast prepped and draped in a sterile fashion. Timeout was done to verify proper sizing procedure. Neoprobe used and hot spot identified and right  breast upper-outer quadrant. This was marked with pen. Curvilinear incision made left upper outer quadrant breast. Dissection used with the help of a neoprobe around the tissue where the seed and clip were located. Tissue removed in its entirety with gross negative  margins.. Neoprobe used and seed within specimen. Radiographs taken which show clip and seed  In specimen.hemostasis achieved and cavity closed with 3-0 Vicryl and 4-0  Monocryl. Dermabond applied. All final counts found to be correct. Specimen transported to pathology. Patient awoke extubated taken to recovery in satisfactory condition.

## 2014-10-01 NOTE — Transfer of Care (Signed)
Immediate Anesthesia Transfer of Care Note  Patient: [name]  Procedure(s) Performed: Procedure(s): BREAST LUMPECTOMY WITH RADIOACTIVE SEED LOCALIZATION (Right)  Patient Location: PACU  Anesthesia Type:General  Level of Consciousness: awake, alert , oriented and patient cooperative  Airway & Oxygen Therapy: Patient Spontanous Breathing and Patient connected to face mask oxygen  Post-op Assessment: Report given to RN and Post -op Vital signs reviewed and stable  Post vital signs: Reviewed and stable  Last Vitals:  Filed Vitals:   10/01/14 1324  BP: 175/60  Pulse: 80  Temp: 36.9 C  Resp: 20    Complications: No apparent anesthesia complications

## 2014-10-01 NOTE — Anesthesia Postprocedure Evaluation (Signed)
Anesthesia Post Note  Patient: [name]  Procedure(s) Performed: Procedure(s) (LRB): BREAST LUMPECTOMY WITH RADIOACTIVE SEED LOCALIZATION (Right)  Anesthesia type: General  Patient location: PACU  Post pain: Pain level controlled and Adequate analgesia  Post assessment: Post-op Vital signs reviewed, Patient's Cardiovascular Status Stable, Respiratory Function Stable, Patent Airway and Pain level controlled  Last Vitals:  Filed Vitals:   10/01/14 1616  BP:   Pulse: 102  Temp:   Resp: 12    Post vital signs: Reviewed and stable  Level of consciousness: awake, alert  and oriented  Complications: No apparent anesthesia complications

## 2014-10-01 NOTE — Anesthesia Procedure Notes (Signed)
Procedure Name: LMA Insertion Date/Time: 10/01/2014 2:59 PM Performed by: Taraoluwa Thakur D Pre-anesthesia Checklist: Patient identified, Emergency Drugs available, Suction available and Patient being monitored Patient Re-evaluated:Patient Re-evaluated prior to inductionOxygen Delivery Method: Circle System Utilized Preoxygenation: Pre-oxygenation with 100% oxygen Intubation Type: IV induction Ventilation: Mask ventilation without difficulty LMA: LMA inserted LMA Size: 4.0 Number of attempts: 1 Airway Equipment and Method: Bite block Placement Confirmation: positive ETCO2 Tube secured with: Tape Dental Injury: Teeth and Oropharynx as per pre-operative assessment

## 2014-10-01 NOTE — Discharge Instructions (Signed)
[facility] °Office Phone Number [phone] ° °BREAST BIOPSY/ PARTIAL MASTECTOMY: POST OP INSTRUCTIONS ° °Always review your discharge instruction sheet given to you by the facility where your surgery was performed. ° °IF YOU HAVE DISABILITY OR FAMILY LEAVE FORMS, YOU MUST BRING THEM TO THE OFFICE FOR PROCESSING.  DO NOT GIVE THEM TO YOUR DOCTOR. ° °1. A prescription for pain medication may be given to you upon discharge.  Take your pain medication as prescribed, if needed.  If narcotic pain medicine is not needed, then you may take acetaminophen (Tylenol) or ibuprofen (Advil) as needed. °2. Take your usually prescribed medications unless otherwise directed °3. If you need a refill on your pain medication, please contact your pharmacy.  They will contact our office to request authorization.  Prescriptions will not be filled after 5pm or on week-ends. °4. You should eat very light the first 24 hours after surgery, such as soup, crackers, pudding, etc.  Resume your normal diet the day after surgery. °5. Most patients will experience some swelling and bruising in the breast.  Ice packs and a good support bra will help.  Swelling and bruising can take several days to resolve.  °6. It is common to experience some constipation if taking pain medication after surgery.  Increasing fluid intake and taking a stool softener will usually help or prevent this problem from occurring.  A mild laxative (Milk of Magnesia or Miralax) should be taken according to package directions if there are no bowel movements after 48 hours. °7. Unless discharge instructions indicate otherwise, you may remove your bandages 24-48 hours after surgery, and you may shower at that time.  You may have steri-strips (small skin tapes) in place directly over the incision.  These strips should be left on the skin for 7-10 days.  If your surgeon used skin glue on the incision, you may shower in 24 hours.  The glue will flake off over the  next 2-3 weeks.  Any sutures or staples will be removed at the office during your follow-up visit. °8. ACTIVITIES:  You may resume regular daily activities (gradually increasing) beginning the next day.  Wearing a good support bra or sports bra minimizes pain and swelling.  You may have sexual intercourse when it is comfortable. °a. You may drive when you no longer are taking prescription pain medication, you can comfortably wear a seatbelt, and you can safely maneuver your car and apply brakes. °b. RETURN TO WORK:  ______________________________________________________________________________________ °9. You should see your doctor in the office for a follow-up appointment approximately two weeks after your surgery.  Your doctor’s nurse will typically make your follow-up appointment when she calls you with your pathology report.  Expect your pathology report 2-3 business days after your surgery.  You may call to check if you do not hear from us after three days. °10. OTHER INSTRUCTIONS: _______________________________________________________________________________________________ _____________________________________________________________________________________________________________________________________ °_____________________________________________________________________________________________________________________________________ °_____________________________________________________________________________________________________________________________________ ° °WHEN TO CALL YOUR DOCTOR: °1. Fever over 101.0 °2. Nausea and/or vomiting. °3. Extreme swelling or bruising. °4. Continued bleeding from incision. °5. Increased pain, redness, or drainage from the incision. ° °The clinic staff is available to answer your questions during regular business hours.  Please don’t hesitate to call and ask to speak to one of the nurses for clinical concerns.  If you have a medical emergency, go to the nearest  emergency room or call 911.  A surgeon from [facility] is always on call at the hospital. ° °For further questions, please visit [email or web address]  ° ° °  Post Anesthesia Home Care Instructions ° °Activity: °Get plenty of rest for the remainder of the day. A responsible adult should stay with you for 24 hours following the procedure.  °For the next 24 hours, DO NOT: °-Drive a car °-Operate machinery °-Drink alcoholic beverages °-Take any medication unless instructed by your physician °-Make any legal decisions or sign important papers. ° °Meals: °Start with liquid foods such as gelatin or soup. Progress to regular foods as tolerated. Avoid greasy, spicy, heavy foods. If nausea and/or vomiting occur, drink only clear liquids until the nausea and/or vomiting subsides. Call your physician if vomiting continues. ° °Special Instructions/Symptoms: °Your throat may feel dry or sore from the anesthesia or the breathing tube placed in your throat during surgery. If this causes discomfort, gargle with warm salt water. The discomfort should disappear within 24 hours. ° °

## 2014-10-01 NOTE — H&P (View-Only) (Signed)
[name] 09/18/2014 8:18 AM Location: [facility] Patient #: [id number] DOB: Oct 16, 1921 Undefined / Language: [name] / Race: Undefined Female History of Present Illness [name] A. Sirron [name] MD; 09/18/2014 2:46 PM) Patient words: right breast DCIS    pt sent at the request of Dr Valere Dross for roght breast microcalcifications. Hx of DCIS in the past and has had previous lumpectomies but no radiation therapy. Has taken AI in the past. she has no complaints. no masses or nipple discharge      ADDITIONAL INFORMATION: PROGNOSTIC INDICATORS - ACIS Results: IMMUNOHISTOCHEMICAL AND MORPHOMETRIC ANALYSIS BY THE AUTOMATED CELLULAR IMAGING SYSTEM (ACIS) Estrogen Receptor: 100%, POSITIVE, STRONG STAINING INTENSITY Progesterone Receptor: 44%, POSITIVE, STRONG STAINING INTENSITY REFERENCE RANGE ESTROGEN RECEPTOR NEGATIVE <1% POSITIVE =>1% PROGESTERONE RECEPTOR NEGATIVE <1% POSITIVE =>1% All controls stained appropriately Taylor Cutter MD Pathologist, Electronic Signature ( Signed 09/18/2014) FINAL DIAGNOSIS Diagnosis Breast, right, needle core biopsy - HIGH GRADE DUCTAL CARCINOMA IN SITU WITH CALCIFICATIONS AND NECROSIS. - FOCAL ACELLULAR MUCIN IS PRESENT WITHIN THE ADJACENT STROMA. - SEE COMMENT. 1 of 2 FINAL for KIWANNA, SPRAKER [phone]) Microscopic Comment The focal acellular mucin present in the adjacent stroma raises the possibility of concomitant invasive carcinoma. However, malignant epithelium is not identified to confirm this possibility. Deeper levels are cut and examined. Ultimately, this possibility is best assessed on excision specimen. Quantitative estrogen receptor and progesterone receptor studies will be performed and reported in an addendum to follow. The findings are called to [facility] on 09/13/2014. Dr. Donato Heinz has seen this case in consultation with agreement. (RH:kh 09/13/14) Taylor Niece MD Pathologist, Electronic  Signature (Case signed 09/13/2014) Specimen Gross and Clinical Information Specimen Comment In formalin 2:25; calco-moderate /high suspicion of DCIS Specimen(s) Obtained: Breast, right, needle core biopsy Gross Received in formalin are 10 cores of soft, tan-red tissue which measure1.0 x 0.3 x 0.3 cm and up to 2.5 x 0.5 x 0.5 cm. Submitted in toto in 2 block(s) sj A= 5 cores B= 5 cores Time in.  The patient is a 81 year old female   Other Problems [name] [place], [place]; 09/18/2014 8:19 AM) Arthritis Breast Cancer Cancer Gastroesophageal Reflux Disease Hemorrhoids Lump In Breast Thyroid Disease  Past Surgical History [name], RMA; 09/18/2014 8:19 AM) Breast Mass; Local Excision Bilateral. Cataract Surgery Bilateral. Esophagomyotomy Knee Surgery Left. Oral Surgery Tonsillectomy Ventral / Umbilical Hernia Surgery Bilateral.  Diagnostic Studies History [name] [name], [place]; 09/18/2014 8:19 AM) Colonoscopy never Mammogram within last year Pap Smear >5 years ago  Social History [name] [name], RMA; 09/18/2014 8:19 AM) Alcohol use Occasional alcohol use. Caffeine use Tea. No drug use Tobacco use Never smoker.  Family History [name] [name], [place]; 09/18/2014 8:19 AM) Arthritis Family Members In General. Breast Cancer Family Members In General, Mother. Cerebrovascular Accident Family Members In General. Heart Disease Father. Ovarian Cancer Family Members In General. Respiratory Condition Family Members In General.  Pregnancy / Birth History [name], [place]; 09/18/2014 8:19 AM) Age at menarche 52 years. Irregular periods     Review of Systems [name] [place] RMA; 09/18/2014 8:19 AM) General Not Present- Appetite Loss, Chills, Fatigue, Fever, Night Sweats, Weight Gain and Weight Loss. Skin Not Present- Change in Wart/Mole, Dryness, Hives, Jaundice, New Lesions, Non-Healing Wounds, Rash and Ulcer. HEENT Present- Hearing Loss, Seasonal  Allergies and Wears glasses/contact lenses. Not Present- Earache, Hoarseness, Nose Bleed, Oral Ulcers, Ringing in the Ears, Sinus Pain, Sore Throat, Visual Disturbances and Yellow Eyes. Respiratory Not Present- Bloody sputum, Chronic Cough, Difficulty Breathing, Snoring  and Wheezing. Breast Not Present- Breast Mass, Breast Pain, Nipple Discharge and Skin Changes. Cardiovascular Present- Leg Cramps and Swelling of Extremities. Not Present- Chest Pain, Difficulty Breathing Lying Down, Palpitations, Rapid Heart Rate and Shortness of Breath. Gastrointestinal Present- Constipation and Hemorrhoids. Not Present- Abdominal Pain, Bloating, Bloody Stool, Change in Bowel Habits, Chronic diarrhea, Difficulty Swallowing, Excessive gas, Gets full quickly at meals, Indigestion, Nausea, Rectal Pain and Vomiting. Female Genitourinary Not Present- Frequency, Nocturia, Painful Urination, Pelvic Pain and Urgency. Neurological Not Present- Decreased Memory, Fainting, Headaches, Numbness, Seizures, Tingling, Tremor, Trouble walking and Weakness. Psychiatric Not Present- Anxiety, Bipolar, Change in Sleep Pattern, Depression, Fearful and Frequent crying. Endocrine Not Present- Cold Intolerance, Excessive Hunger, Hair Changes, Heat Intolerance, Hot flashes and New Diabetes. Hematology Not Present- Easy Bruising, Excessive bleeding, Gland problems, HIV and Persistent Infections.   Physical Exam ([name] A. [name] MD; 09/18/2014 2:47 PM)  General Mental Status-Alert. General Appearance-Consistent with stated age. Hydration-Well hydrated. Voice-Normal.  Head and Neck Head-normocephalic, atraumatic with no lesions or palpable masses. Trachea-midline. Thyroid Gland Characteristics - normal size and consistency.  Eye Eyeball - Bilateral-Extraocular movements intact. Sclera/Conjunctiva - Bilateral-No scleral icterus.  Chest and Lung Exam Chest and lung exam reveals -quiet, even and easy respiratory  effort with no use of accessory muscles and on auscultation, normal breath sounds, no adventitious sounds and normal vocal resonance. Inspection Chest Wall - Normal. Back - normal.  Breast Breast - Left-Symmetric, Non Tender, No Biopsy scars, no Dimpling, No Inflammation, No Lumpectomy scars, No Mastectomy scars, No Peau d' Orange. Breast - Right-Symmetric, Non Tender, No Biopsy scars, no Dimpling, No Inflammation, No Lumpectomy scars, No Mastectomy scars, No Peau d' Orange. Breast Lump-No Palpable Breast Mass. Note: bilateral well healed breast scars   Cardiovascular Cardiovascular examination reveals -normal heart sounds, regular rate and rhythm with no murmurs and normal pedal pulses bilaterally.  Abdomen Inspection Inspection of the abdomen reveals - No Hernias. Skin - Scar - no surgical scars. Palpation/Percussion Palpation and Percussion of the abdomen reveal - Soft, Non Tender, No Rebound tenderness, No Rigidity (guarding) and No hepatosplenomegaly. Auscultation Auscultation of the abdomen reveals - Bowel sounds normal.  Neurologic Neurologic evaluation reveals -alert and oriented x 3 with no impairment of recent or remote memory. Mental Status-Normal.  Musculoskeletal Normal Exam - Left-Upper Extremity Strength Normal and Lower Extremity Strength Normal. Normal Exam - Right-Upper Extremity Strength Normal and Lower Extremity Strength Normal.  Lymphatic Head & Neck  General Head & Neck Lymphatics: Bilateral - Description - Normal. Axillary  General Axillary Region: Bilateral - Description - Normal. Tenderness - Non Tender. Femoral & Inguinal  Generalized Femoral & Inguinal Lymphatics: Bilateral - Description - Normal. Tenderness - Non Tender.    Assessment & Plan (Sarann Tregre A. Cabe Lashley MD; 09/18/2014 2:48 PM)  DCIS (DUCTAL CARCINOMA IN SITU), RIGHT (233.0  D05.11) Impression: PT good candidate for right breast seed localized lumpectomy. Risk of  lumpectomy include bleeding, infection, seroma, more surgery, use of seed/wire, wound care, cosmetic deformity and the need for other treatments, death , blood clots, death. Pt agrees to proceed.  Current Plans Pt Education - [facility] Breast Biopsy [facility]CI

## 2014-10-02 ENCOUNTER — Encounter (HOSPITAL_BASED_OUTPATIENT_CLINIC_OR_DEPARTMENT_OTHER): Payer: Self-pay | Admitting: Surgery

## 2014-10-03 ENCOUNTER — Telehealth: Payer: Self-pay | Admitting: Oncology

## 2014-10-03 NOTE — Telephone Encounter (Signed)
per pof to sch pt appt-DEXA sch @ [facility] 4/4 @ 11:30-cld & spoke to pt and adv of appt/time/location

## 2014-10-04 ENCOUNTER — Encounter: Payer: Self-pay | Admitting: General Practice

## 2014-10-04 NOTE — Progress Notes (Signed)
[facility] Psychosocial Distress Screening Spiritual Care  Received distress screen and phoned Ms Blackie to follow up.  The patient scored a 7 on the Psychosocial Distress Thermometer which indicates severe distress. Also assessed for distress and other psychosocial needs.   ONCBCN DISTRESS SCREENING 10/04/2014  Screening Type Initial Screening  Distress experienced in past week (1-10) 7  Emotional problem type Nervousness/Anxiety;Adjusting to illness  Physical Problem type Getting around;Constipation/diarrhea  Referral to support programs Yes  Other Spiritual Care   Ms Salada was in good spirits, reporting strong support from husband, friends, church, a younger family she and her husband have adopted, and "the cute little bra they gave me for extra support."  She is using humor, gratitude, and community support to cope.  She lives at [facility] and attends [employer] on [address]; we chatted about people we know in common at both places.  Pt was appreciative of emotional support and rapport-building.  Follow up needed: No.  Pt reports good support and is aware of ongoing [place] team/resource availability.  Please also page as needs arise.  Thank you.  [place], [place]

## 2014-10-10 ENCOUNTER — Encounter: Payer: Self-pay | Admitting: Genetic Counselor

## 2014-10-10 DIAGNOSIS — Z1379 Encounter for other screening for genetic and chromosomal anomalies: Secondary | ICD-10-CM | POA: Insufficient documentation

## 2014-10-10 NOTE — Progress Notes (Signed)
GENETIC TEST RESULTS  Patient Name: [name] Patient Age: 81 y.o. Encounter Date: 10/10/2014  Referring Physician: Lurline Del, MD   Ms. Mapel was called today to discuss genetic test results. Please see the Genetics note from her visit on 09/18/14 for a detailed discussion of her personal and family history.  GENETIC TESTING: At the time of Ms. Vanengen's visit, we recommended she pursue genetic testing of multiple genes on the BreastNext gene panel. This test, which included sequencing and deletion/duplication analysis of 17 genes, was performed at [facility]. Testing was did not reveal any clearly pathogenic mutation in these genes. The genes tested were ATM, BARD1, BRCA1, BRCA2, BRIP1, CDH1, CHEK2, MRE11A, MUTYH, NBN, NF1, PALB2, PTEN, RAD50, RAD51C, RAD51D, and TP53.  We discussed with Ms. Petersen that since the current test is not perfect, it is possible there may be a gene mutation that current testing cannot detect, but that chance is small. We also discussed that it is possible that a different genetic factor, which was not part of this testing or has not yet been discovered, is responsible for the cancer diagnoses in the family. Should Ms. Dolney wish to discuss or pursue this additional testing, we are happy to coordinate this at any time, but do not feel that she is at significant risk of harboring a mutation in a different gene.     Genetic testing did detect a Variant of Unknown Significance in the RAD50 gene called p.Q799H (c.2397G>C). At this time, it is unknown if this variant is associated with increased cancer risk or if this is a normal finding, but most variants such as this get reclassified to being inconsequential. It should not be used to make medical management decisions. With time, we suspect the lab will determine the significance of this variant, if any. If we do learn more about it, we will try to contact Ms. Salomon to discuss it further. However, it is  important to stay in touch with [place] periodically and keep the address and phone number up to date.  CANCER SCREENING:  Given the ages at breast cancer diagnosis, this result suggests that Ms. Davern's cancers were most likely not due to an inherited predisposition. Most cancers happen by chance and this test, along with details of her family history, suggests that her cancers fall into this category. We will defer her cancer screenings to her physician.   FAMILY MEMBERS:  Women in the family are at some increased risk of developing breast cancer, over the general population risk, simply due to the family history. We recommended they have a yearly mammogram beginning at age 48, a yearly clinical breast exam, and perform monthly breast self-exams. A gynecologic exam is recommended yearly. Colon cancer screening is recommended to begin by age 53.  Lastly, we discussed with Ms. Macdougal that cancer genetics is a rapidly advancing field and it is possible that new genetic tests will be appropriate for her in the future. We encouraged her to remain in contact with [place] on an annual basis so we can update her personal and family histories, and let her know of advances in cancer genetics that may benefit the family. Our contact number was provided. Ms. Anderegg questions were answered to her satisfaction today, and she knows she is welcome to call anytime with additional questions.    Steele Berg, MS, [place] Certified Genetic Counselor phone: [phone] [email or web address]

## 2014-10-21 DIAGNOSIS — Z0000 Encounter for general adult medical examination without abnormal findings: Secondary | ICD-10-CM | POA: Diagnosis not present

## 2014-10-21 DIAGNOSIS — M8580 Other specified disorders of bone density and structure, unspecified site: Secondary | ICD-10-CM | POA: Diagnosis not present

## 2014-10-31 ENCOUNTER — Ambulatory Visit (INDEPENDENT_AMBULATORY_CARE_PROVIDER_SITE_OTHER): Payer: MEDICARE | Admitting: Podiatry

## 2014-10-31 DIAGNOSIS — M79673 Pain in unspecified foot: Secondary | ICD-10-CM

## 2014-10-31 DIAGNOSIS — B351 Tinea unguium: Secondary | ICD-10-CM | POA: Diagnosis not present

## 2014-10-31 DIAGNOSIS — Q829 Congenital malformation of skin, unspecified: Secondary | ICD-10-CM

## 2014-10-31 NOTE — Progress Notes (Signed)
   Subjective:    Patient ID: [name], female    DOB: Jun 14, 1922, 81 y.o.   MRN: [id number]  HPI  Pt presents for nail debridement  Review of Systems     Objective:   Physical Exam: Pulses are strongly palpable bilateral. Porokeratosis are present plantar aspect of the bilateral foot. Nails are thick yellow dystrophic onychomycotic and painful palpation.        Assessment & Plan:  Assessment: Pain in limb secondary to onychomycosis 1 through 5 bilateral. Pain in limb secondary to porokeratotic lesions plantar aspect of the bilateral foot.  Plan: Debridement nails 1 through 5 bilateral is cover service secondary to pain. Debridement all reactive hyperkeratosis.

## 2014-11-03 NOTE — Progress Notes (Signed)
[name]  [phone] [phone]     ID: [name] DOB: [birth year]  MR#: [id number]  NIO#:[id number]  Patient Care Team: Marletta Lor, MD as PCP - General Erroll Luna, MD as Consulting Physician (General Surgery) Chauncey Cruel, MD as Consulting Physician (Oncology) Arloa Koh, MD as Consulting Physician (Radiation Oncology) [name], RN as Registered Nurse [name], RN as Registered Nurse [name], NP as Nurse Practitioner (Nurse Practitioner) [name], MD as Consulting Physician (Gastroenterology) OTHER MD:  CHIEF COMPLAINT: ductal carcinoma in situ, right breast  CURRENT TREATMENT: To consider adjuvant anti-estrogens   BREAST CANCER HISTORY: From the earlier summary note:  I saw [name] previously for a history of bilateral breast cancers, treated with bilateral lumpectomies and anti-estrogens for 5 years. She did not receive radiation treatments. She was last seen here in 2011.  She had her annual screening mammography at [facility] 08/27/2013, and this showed the breast density to be category B. There was an area of new grouped heterogeneous calcifications in the right breast at the 11:00 middle depth. A focal asymmetric density anterior to that area was seen in one view only. Additional views were recommended, but if they were performed I do not have those records. There appears to have been mammography on 03/05/2014, but again I do not have those records.  On 08/30/2014, bilateral diagnostic mammography with tomosynthesis this was obtained. The calcifications at the 11:00 position in the right breast were increased in number. There were no other significant findings. Biopsy of the area in question to 20 11/06/2014 showed ([id number]) high-grade ductal carcinoma in situ, with mucin extravasation.no definitive invasive component was seen however. This high-grade noninvasive tumor was estrogen receptor  positive at 100%, progesterone receptor positive at 44%, both with strong staining intensity.  Her subsequent history is as detailed below  INTERVAL HISTORY: [name] returns today for follow-up of her right-sided breast cancer accompanied by her husband [name]. Since her last visit here she underwent right lumpectomy on 10/01/2014. The final pathology ([id number]) showed an invasive ductal carcinoma with extravasated mucin measuring 4 mm. This was grade 2. There was also ductal carcinoma in situ, which was 1 mm to the nearest inferior margin.   REVIEW OF SYSTEMS: She did well with the surgery, with no significant problems with pain, fever, bleeding, or other issues. She is "back to baseline". A detailed review of systems today was otherwise stable  PAST MEDICAL HISTORY: Past Medical History  Diagnosis Date  . Allergic rhinitis, seasonal   . Osteoarthritis   . Menopausal syndrome   . HX: breast cancer     bilateral  . Osteoporosis   . Goiter     right thyroid nodule   . Squamous cell skin cancer     right lower leg  . Chronic venous insufficiency   . Dermatophytosis of nail   . Breast cancer of upper-outer quadrant of right female breast 09/13/2014  . Family history of malignant neoplasm of breast   . Family history of malignant neoplasm of ovary   . Wears glasses   . Wears hearing aid     both ears  . Edema leg     PAST SURGICAL HISTORY: Past Surgical History  Procedure Laterality Date  . Left eardum sx  1983  . Cataract extraction    . Tonsillectomy    . Dilation and curettage of uterus    . Umbilical hernia repair    . Bilateral  lumpectomies for breast cancer  Aug. 2007  . Left knee arthroscopic surgery    . History of lumbar compression fracture    . Status post resection squamous cell cancer right lower leg    . No screening colonoscopy    . S/p bilateral lumpectomies    . Esophagogastroduodenoscopy  07/26/2011    Procedure: ESOPHAGOGASTRODUODENOSCOPY (EGD);  Surgeon:  Jeryl Columbia, MD;  Location: [facility];  Service: Endoscopy;  Laterality: N/A;  . Savory dilation  07/26/2011    Procedure: SAVORY DILATION;  Surgeon: Jeryl Columbia, MD;  Location: [facility];  Service: Endoscopy;  Laterality: N/A;  . Breast lumpectomy with radioactive seed localization Right 10/01/2014    Procedure: BREAST LUMPECTOMY WITH RADIOACTIVE SEED LOCALIZATION;  Surgeon: Erroll Luna, MD;  Location: [place];  Service: General;  Laterality: Right;    FAMILY HISTORY Family History  Problem Relation Age of Onset  . Cancer Mother 2    breast  . Heart attack Father   . Heart attack Brother   . Cancer Maternal Aunt 55    breast  . Coronary artery disease Other   . Cancer Other     ovarian; daughter of mat aunt w/ BC in 35s  . Heart attack Brother   . Heart disease Brother   . Cancer Maternal Aunt 63    breast  the patient's father died at age 24 from a heart attack. The patient's mother died at age 42. The patient's mother was one of 55 sisters, 9 siblings. 2 of the sisters had breast cancer, postmenopausal. [name] self had 2 half-brothers and 1 full brother as well as one half-sister.There is no history of ovarian cancer in the familyexcept as noted.  GYNECOLOGIC HISTORY:  No LMP recorded. Patient is postmenopausal. Menarche age 84, the patient is GX P0. 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 Her husband [name] is a former Programme researcher, broadcasting/film/video. They currently reside at friends home [place], with no pets.    ADVANCED DIRECTIVES: in place   HEALTH MAINTENANCE: History  Substance Use Topics  . Smoking status: Never Smoker   . Smokeless tobacco: Never Used  . Alcohol Use: No     Colonoscopy:  PAP:  Bone density:  Lipid panel:  Allergies  Allergen Reactions  . Celecoxib     REACTION: Swelling-leg      Current Outpatient Prescriptions  Medication Sig Dispense Refill  . Calcium Carb-Cholecalciferol (CALCIUM 600 + D PO) Take by mouth daily.    . cycloSPORINE (RESTASIS) 0.05 % ophthalmic emulsion Place 1 drop into both eyes 2 (two) times daily.    . fexofenadine (ALLEGRA) 180 MG tablet Take 1 tablet (180 mg total) by mouth daily. 60 tablet 3  . fluticasone (FLONASE) 50 MCG/ACT nasal spray Place 2 sprays into the nose daily. 16 g 6  . furosemide (LASIX) 40 MG tablet Take 1 tablet (40 mg total) by mouth daily. (Patient taking differently: Take 40 mg by mouth daily. As needed) 90 tablet 3  . HYDROcodone-acetaminophen (NORCO) 5-325 MG per tablet Take 1 tablet by mouth every 6 (six) hours as needed for moderate pain. 30 tablet 0  . pantoprazole (PROTONIX) 40 MG tablet Take 40 mg by mouth daily.     Marland Kitchen triamcinolone (KENALOG) 0.1 % cream Apply 1 application topically 2 (two) times daily.  No current facility-administered medications for this visit.    OBJECTIVE: elderly white woman in no acute distress Filed Vitals:   11/04/14 1630  BP: 143/54  Pulse: 79  Temp: 98.2 F (36.8 C)  Resp: 18     Body mass index is 31.49 kg/(m^2).    ECOG FS:0 - Asymptomatic  The scar over the right breast is healing well, with no significant erythema, swelling, and no dehiscence.  LAB RESULTS:  CMP     Component Value Date/Time   NA 145 09/18/2014 1245   NA 143 06/25/2013 0948   K 3.9 09/18/2014 1245   K 4.4 06/25/2013 0948   CL 108 06/25/2013 0948   CO2 25 09/18/2014 1245   CO2 27 06/25/2013 0948   GLUCOSE 107 09/18/2014 1245   GLUCOSE 100* 06/25/2013 0948   BUN 20.1 09/18/2014 1245   BUN 21 06/25/2013 0948   CREATININE 1.2* 09/18/2014 1245   CREATININE 1.2 06/25/2013 0948   CALCIUM 9.1 09/18/2014 1245   CALCIUM 9.2 06/25/2013 0948   PROT 6.8 09/18/2014 1245   PROT 6.8 06/25/2013 0948   ALBUMIN 3.8 09/18/2014 1245   ALBUMIN 3.9 06/25/2013 0948   AST 14 09/18/2014 1245   AST 17  06/25/2013 0948   ALT 8 09/18/2014 1245   ALT 12 06/25/2013 0948   ALKPHOS 100 09/18/2014 1245   ALKPHOS 78 06/25/2013 0948   BILITOT 0.49 09/18/2014 1245   BILITOT 0.7 06/25/2013 0948   GFRNONAA 50.02 04/14/2010 1043    INo results found for: SPEP, UPEP  Lab Results  Component Value Date   WBC 6.2 09/18/2014   NEUTROABS 4.6 09/18/2014   HGB 12.6 09/18/2014   HCT 38.5 09/18/2014   MCV 92.0 09/18/2014   PLT 199 09/18/2014      Chemistry      Component Value Date/Time   NA 145 09/18/2014 1245   NA 143 06/25/2013 0948   K 3.9 09/18/2014 1245   K 4.4 06/25/2013 0948   CL 108 06/25/2013 0948   CO2 25 09/18/2014 1245   CO2 27 06/25/2013 0948   BUN 20.1 09/18/2014 1245   BUN 21 06/25/2013 0948   CREATININE 1.2* 09/18/2014 1245   CREATININE 1.2 06/25/2013 0948      Component Value Date/Time   CALCIUM 9.1 09/18/2014 1245   CALCIUM 9.2 06/25/2013 0948   ALKPHOS 100 09/18/2014 1245   ALKPHOS 78 06/25/2013 0948   AST 14 09/18/2014 1245   AST 17 06/25/2013 0948   ALT 8 09/18/2014 1245   ALT 12 06/25/2013 0948   BILITOT 0.49 09/18/2014 1245   BILITOT 0.7 06/25/2013 0948       Lab Results  Component Value Date   LABCA2 34 05/20/2011    No components found for: LEXNT700  No results for input(s): INR in the last 168 hours.  Urinalysis    Component Value Date/Time   COLORURINE yellow 10/28/2009 0905   APPEARANCEUR Clear 10/28/2009 0905   LABSPEC 1.025 10/28/2009 0905   PHURINE 5.0 10/28/2009 0905   HGBUR trace-intact 10/28/2009 0905   BILIRUBINUR negative 10/28/2009 0905   UROBILINOGEN 0.2 10/28/2009 0905   NITRITE negative 10/28/2009 0905    STUDIES: No results found.  ASSESSMENT: 81 y.o. BRCA negative [place] resident   (1) status post bilateral lumpectomies without sentinel lymph node biopsy in August 2007 for a right-sided 7 mm, grade 2, invasive ductal carcinoma, and a left-sided 8 mm, grade 1, invasive ductal carcinoma.  Both tumors were ER  positive,  HER-2/neu negative, both with a low proliferation fraction.   (a) On Arimidex from September 2007 until May 2009, at which time we switched to tamoxifen primarily secondary to concerns regarding osteoporosis, completing five years of antiestrogen September 2011.   (b)  Also received Zometa yearly, discontinued after 4 doses in 2011  (2)  Status post right breast upper outer quadrant biopsy 09/11/2014 4 a 4.2 cm area of ductal carcinoma in situ, grade 2 or 3,  with extravasated mucin, estrogen and progesterone receptor positive   (3) status post right lumpectomy 10/01/2014 for a pT1a pNX, stage IA invasive ductal carcinoma, grade 2, estrogen  and progesterone receptor positive, HER-2 negative, with an MIB-1 of 38%. Margins were close but negative   (4) she will not need adjuvant radiation   (5) anastrozole to follow surgery  (a)  DEXA scan 10/31/2014 shows a T score of -1.8.  (6)  the BreastNext gene panel performed at [facility] 09/19/2014 showed no deleterious mutations in ATM, BARD1, BRCA1, BRCA2, BRIP1, CDH1, CHEK2, MRE11A, MUTYH, NBN, NF1, PALB2, PTEN, RAD50, RAD51C, RAD51D, or TP53.  PLAN: I spent approximately 40 minutes with [name] and [name] going over her situation. She had mostly noninvasive breast cancer. The small invasive component was estrogen receptor positive. NCCN guidelines suggest that anti-estrogens be considered. In a younger woman think they would be considered more strongly. In someone who is already 81 years old, I think the significant side effects from anti-estrogens including clots from tamoxifen and worsening osteopenia from anastrozole, would carry more weight.  With regards to the ductal carcinoma in situ component, the margins were negative but close. This is a perennial cause of debate. At the [facility] conference 2015 the consensus was that a negative margin for DCIS was the same as for invasive disease. NCCN guidelines however recommend more than a  millimeter. However those guidelines also say that when patients are older than 70, the guidelines need to be taken more broadly since there is less data.  The data for patients over 90 is even more limited. Certainly no one would argue that this cancer, either in its invasive or noninvasive component, would be life-threatening for [name] even if she did nothing.  I discussed her situation with radiation oncology and the feeling was that if she took anastrozole certainly she would not need radiation. If she did not, and had a survival of greater than 5 years, perhaps a brief course of radiation might save her a local recurrence.  Given all this, the decision was for observation alone. [name] is comfortable with this as is [name]. They both live at friends home's where they see elderly people  fall, fracture a leg, develop an infection, or simply die in their sleep all the time. They are in short aware of the multiple competing causes of death. I reassured her that whatever else, this cancer is not life-threatening to her even if it were to recur.   In short we decided the possible minimal gains in terms of recurrence would be more likely over bowels by the possible side effects of adjuvant treatment. Also I gave her a copy of her genetics tests, so she can share them with the family.  She will see [place] again in 3 months. She will have annual mammography with tomography. She will have a physician breast exam at least twice a year indefinitely.  [name]  has a good understanding of the overall plan. She agrees with it. She will call with any problems  that may develop before her next visit here.  Chauncey Cruel, MD   11/04/2014 6:11 PM Medical Oncology and Hematology [facility] [address] [place], [place] [address] Tel. [phone]    Fax. [phone]

## 2014-11-04 ENCOUNTER — Encounter: Payer: Self-pay | Admitting: *Deleted

## 2014-11-04 ENCOUNTER — Ambulatory Visit (HOSPITAL_BASED_OUTPATIENT_CLINIC_OR_DEPARTMENT_OTHER): Payer: MEDICARE | Admitting: Oncology

## 2014-11-04 VITALS — BP 143/54 | HR 79 | Temp 98.2°F | Resp 18 | Ht 62.0 in | Wt 172.2 lb

## 2014-11-04 DIAGNOSIS — C50911 Malignant neoplasm of unspecified site of right female breast: Secondary | ICD-10-CM

## 2014-11-04 DIAGNOSIS — Z170 Estrogen receptor positive status [ER+]: Secondary | ICD-10-CM

## 2014-11-04 DIAGNOSIS — M810 Age-related osteoporosis without current pathological fracture: Secondary | ICD-10-CM

## 2014-11-04 DIAGNOSIS — C50411 Malignant neoplasm of upper-outer quadrant of right female breast: Secondary | ICD-10-CM

## 2014-11-04 NOTE — Progress Notes (Signed)
Met with pt post op. Relate she is doing well. Denies needs or concerns at this time. Encourage pt to call with questions. Received verbal understanding.

## 2014-11-05 ENCOUNTER — Encounter: Payer: Self-pay | Admitting: *Deleted

## 2014-11-05 ENCOUNTER — Telehealth: Payer: Self-pay | Admitting: Oncology

## 2014-11-05 NOTE — Telephone Encounter (Signed)
Spoke with patient and she is aware of her July appiointment

## 2014-11-06 ENCOUNTER — Telehealth: Payer: Self-pay | Admitting: Adult Health

## 2014-11-06 NOTE — Telephone Encounter (Signed)
[name] returned my call today.  I offered to mail her Survivorship Care Plan to her home given that she has only received surgery for her breast cancer and will now proceed to observation only.  She and Dr. Jana Hakim have elected not to pursue anti-estrogen therapy in her case.    [name] is amenable to having her care plan mailed to her home in lieu of an in-person survivorship visit with me.  She expressed gratitude for my call.  I let her know that I would mail her care plan along with my business card and encouraged her to contact me with any questions/concerns regarding the care plan or any symptoms that I may be able to help with.  She expressed verbal understanding of this plan and agreed with it.   Mike Craze, NP [place] [phone]

## 2014-11-06 NOTE — Telephone Encounter (Signed)
I left a message for [name] to offer her a copy of her Survivorship Care Plan via mail, given that she has completed surgery for the breast cancer and has elected to pursue observation only from this point forward.  I am happy to see her for an in-person visit, but given her uncomplicated treatment course, I offered to mail her a copy of the care plan as well for her review and records.    I did ask that she return my call to let me know her preferences and gave her my direct office number.    Mike Craze, NP [place] [phone]

## 2014-11-18 ENCOUNTER — Encounter: Payer: Self-pay | Admitting: Adult Health

## 2014-11-18 NOTE — Progress Notes (Signed)
[name] declined an in-person meeting in the [facility].  Therefore, a letter and a copy of her Survivorship Care Plan document was mailed to her home per her request.  The letter encourages [name] to review the care plan and contact me with any questions or concerns.  My business card was included in the correspondence to the patient as well.  A copy of the care plan was also routed/faxed/mailed to Nyoka Cowden, MD, the patient's PCP.  I will not be placing any follow-up appointments to the Survivorship Clinic for [name], although I am happy to see her in the future, if needed.   Mike Craze, NP [place] [phone]

## 2014-11-27 ENCOUNTER — Encounter: Payer: Self-pay | Admitting: Adult Health

## 2014-11-27 NOTE — Progress Notes (Signed)
A birthday card was mailed to the patient today on behalf of the Survivorship Program at [facility].   Gretchen [place], NP Survivorship Program [facility] [phone]  

## 2014-12-10 DIAGNOSIS — H524 Presbyopia: Secondary | ICD-10-CM | POA: Diagnosis not present

## 2014-12-10 DIAGNOSIS — H35363 Drusen (degenerative) of macula, bilateral: Secondary | ICD-10-CM | POA: Diagnosis not present

## 2014-12-10 DIAGNOSIS — H04123 Dry eye syndrome of bilateral lacrimal glands: Secondary | ICD-10-CM | POA: Diagnosis not present

## 2015-01-10 DIAGNOSIS — H35373 Puckering of macula, bilateral: Secondary | ICD-10-CM | POA: Diagnosis not present

## 2015-01-10 DIAGNOSIS — H43813 Vitreous degeneration, bilateral: Secondary | ICD-10-CM | POA: Diagnosis not present

## 2015-01-10 DIAGNOSIS — H3531 Nonexudative age-related macular degeneration: Secondary | ICD-10-CM | POA: Diagnosis not present

## 2015-01-14 ENCOUNTER — Ambulatory Visit (INDEPENDENT_AMBULATORY_CARE_PROVIDER_SITE_OTHER): Payer: MEDICARE | Admitting: Podiatry

## 2015-01-14 ENCOUNTER — Encounter: Payer: Self-pay | Admitting: Podiatry

## 2015-01-14 DIAGNOSIS — Q828 Other specified congenital malformations of skin: Secondary | ICD-10-CM | POA: Diagnosis not present

## 2015-01-14 DIAGNOSIS — M79676 Pain in unspecified toe(s): Secondary | ICD-10-CM | POA: Diagnosis not present

## 2015-01-14 DIAGNOSIS — B351 Tinea unguium: Secondary | ICD-10-CM | POA: Diagnosis not present

## 2015-01-14 DIAGNOSIS — M79673 Pain in unspecified foot: Secondary | ICD-10-CM

## 2015-01-14 NOTE — Progress Notes (Signed)
   Subjective:    Patient ID: [name], female    DOB: [birth year], 81 y.o.   MRN: [id number]  HPI  Pt presents for nail debridement  Review of Systems     Objective:   Physical Exam: Pulses are strongly palpable bilateral. Porokeratosis are present plantar aspect of the bilateral foot. Nails are thick yellow dystrophic onychomycotic and painful palpation.        Assessment & Plan:  Assessment: Pain in limb secondary to onychomycosis 1 through 5 bilateral. Pain in limb secondary to porokeratotic lesions plantar aspect of the bilateral foot.  Plan: Debridement nails 1 through 5 bilateral is cover service secondary to pain. Debridement all reactive hyperkeratosis.

## 2015-02-05 ENCOUNTER — Telehealth: Payer: Self-pay | Admitting: Oncology

## 2015-02-05 ENCOUNTER — Ambulatory Visit (HOSPITAL_BASED_OUTPATIENT_CLINIC_OR_DEPARTMENT_OTHER): Payer: MEDICARE | Admitting: Oncology

## 2015-02-05 VITALS — BP 148/41 | HR 70 | Temp 98.0°F | Resp 18 | Ht 62.0 in | Wt 171.0 lb

## 2015-02-05 DIAGNOSIS — C50911 Malignant neoplasm of unspecified site of right female breast: Secondary | ICD-10-CM | POA: Diagnosis not present

## 2015-02-05 DIAGNOSIS — Z170 Estrogen receptor positive status [ER+]: Secondary | ICD-10-CM

## 2015-02-05 DIAGNOSIS — C50411 Malignant neoplasm of upper-outer quadrant of right female breast: Secondary | ICD-10-CM

## 2015-02-05 DIAGNOSIS — M810 Age-related osteoporosis without current pathological fracture: Secondary | ICD-10-CM

## 2015-02-05 NOTE — Telephone Encounter (Signed)
Gave avs & calendar for March 2017. °

## 2015-02-05 NOTE — Progress Notes (Signed)
[facility]  [phone] [phone]     ID: [name] DOB: 1922/04/11  MR#: [id number]  CLE#:[id number]  Patient Care Team: Taylor Lor, MD as PCP - General Taylor Luna, MD as Consulting Physician (General Surgery) Taylor Cruel, MD as Consulting Physician (Oncology) Taylor Koh, MD as Consulting Physician (Radiation Oncology) Taylor Kaufmann, RN as Registered Nurse Taylor Germany, RN as Registered Nurse Taylor Bouche, NP as Nurse Practitioner (Nurse Practitioner) Taylor Essex, MD as Consulting Physician (Gastroenterology) OTHER MD:  CHIEF COMPLAINT: ductal carcinoma in situ, right breast  CURRENT TREATMENT: observation   BREAST CANCER HISTORY: From the earlier summary note:  I saw [name] previously for a history of bilateral breast cancers, treated with bilateral lumpectomies and anti-estrogens for 5 years. She did not receive radiation treatments. She was last seen here in 2011.  She had her annual screening mammography at [name] 08/27/2013, and this showed the breast density to be category B. There was an area of new grouped heterogeneous calcifications in the right breast at the 11:00 middle depth. A focal asymmetric density anterior to that area was seen in one view only. Additional views were recommended, but if they were performed I do not have those records. There appears to have been mammography on 03/05/2014, but again I do not have those records.  On 08/30/2014, bilateral diagnostic mammography with tomosynthesis this was obtained. The calcifications at the 11:00 position in the right breast were increased in number. There were no other significant findings. Biopsy of the area in question to 20 11/06/2014 showed ([id number]) high-grade ductal carcinoma in situ, with mucin extravasation.no definitive invasive component was seen however. This high-grade noninvasive tumor was estrogen receptor positive at 100%,  progesterone receptor positive at 44%, both with strong staining intensity.  Her subsequent history is as detailed below  INTERVAL HISTORY: [name] returns today for follow-up of her right-sided breast cancer accompanied by her husband [name]. The interval history is unremarkable. She enjoys living at friends home's. She is on the [employer]. She is very active putting up and taking down books and getting books to the friends of the [place] downtown. She doesn't participate in the exercise programs. She does a lot of walking up and down the hall, though, and she is thinking of getting the steps APP that will tell her how many steps she took in a given day.  REVIEW OF SYSTEMS: She has a normal appetite and her weight is stable. She did see Dr. Watt Marks and he started her on Protonix which seems to have worked well for her. A detailed review of systems today was otherwise entirely stable.  PAST MEDICAL HISTORY: Past Medical History  Diagnosis Date  . Allergic rhinitis, seasonal   . Osteoarthritis   . Menopausal syndrome   . HX: breast cancer     bilateral  . Osteoporosis   . Goiter     right thyroid nodule   . Squamous cell skin cancer     right lower leg  . Chronic venous insufficiency   . Dermatophytosis of nail   . Breast cancer of upper-outer quadrant of right female breast 09/13/2014  . Family history of malignant neoplasm of breast   . Family history of malignant neoplasm of ovary   . Wears glasses   . Wears hearing aid     both ears  . Edema leg     PAST SURGICAL HISTORY: Past Surgical History  Procedure Laterality Date  .  Left eardum sx  1983  . Cataract extraction    . Tonsillectomy    . Dilation and curettage of uterus    . Umbilical hernia repair    . Bilateral lumpectomies for breast cancer  Aug. 2007  . Left knee arthroscopic surgery    . History of lumbar compression fracture    . Status post resection squamous cell cancer right lower leg    . No screening  colonoscopy    . S/p bilateral lumpectomies    . Esophagogastroduodenoscopy  07/26/2011    Procedure: ESOPHAGOGASTRODUODENOSCOPY (EGD);  Surgeon: Taylor Columbia, MD;  Location: [facility];  Service: Endoscopy;  Laterality: N/A;  . Savory dilation  07/26/2011    Procedure: SAVORY DILATION;  Surgeon: Taylor Columbia, MD;  Location: [facility];  Service: Endoscopy;  Laterality: N/A;  . Breast lumpectomy with radioactive seed localization Right 10/01/2014    Procedure: BREAST LUMPECTOMY WITH RADIOACTIVE SEED LOCALIZATION;  Surgeon: Taylor Luna, MD;  Location: [facility];  Service: General;  Laterality: Right;    FAMILY HISTORY Family History  Problem Relation Age of Onset  . Cancer Mother 39    breast  . Heart attack Father   . Heart attack Brother   . Cancer Maternal Aunt 55    breast  . Coronary artery disease Other   . Cancer Other     ovarian; daughter of mat aunt w/ BC in 34s  . Heart attack Brother   . Heart disease Brother   . Cancer Maternal Aunt 52    breast  the patient's father died at age 79 from a heart attack. The patient's mother died at age 109. The patient's mother was one of 42 sisters, 9 siblings. 2 of the sisters had breast cancer, postmenopausal. Amsi Grimley self had 2 half-brothers and 1 full brother as well as one half-sister.There is no history of ovarian cancer in the familyexcept as noted.  GYNECOLOGIC HISTORY:  No LMP recorded. Patient is postmenopausal. Menarche age 23, the patient is GX P0. 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 Her husband [name] is a former Programme researcher, broadcasting/film/video. They currently reside at friends home [place], with no pets.    ADVANCED DIRECTIVES: in place   HEALTH MAINTENANCE: History  Substance Use Topics  . Smoking status: Never Smoker   . Smokeless tobacco: Never Used  .  Alcohol Use: No     Colonoscopy:  PAP:  Bone density:  Lipid panel:  Allergies  Allergen Reactions  . Celecoxib     REACTION: Swelling-leg    Current Outpatient Prescriptions  Medication Sig Dispense Refill  . Calcium Carb-Cholecalciferol (CALCIUM 600 + D PO) Take by mouth daily.    . cycloSPORINE (RESTASIS) 0.05 % ophthalmic emulsion Place 1 drop into both eyes 2 (two) times daily.    . fexofenadine (ALLEGRA) 180 MG tablet Take 1 tablet (180 mg total) by mouth daily. 60 tablet 3  . fluticasone (FLONASE) 50 MCG/ACT nasal spray Place 2 sprays into the nose daily. 16 g 6  . furosemide (LASIX) 40 MG tablet Take 1 tablet (40 mg total) by mouth daily. (Patient taking differently: Take 40 mg by mouth daily. As needed) 90 tablet 3  . HYDROcodone-acetaminophen (NORCO) 5-325 MG per tablet Take 1 tablet by mouth every 6 (six) hours as needed for moderate pain. 30 tablet 0  .  pantoprazole (PROTONIX) 40 MG tablet Take 40 mg by mouth daily.     Marland Kitchen triamcinolone (KENALOG) 0.1 % cream Apply 1 application topically 2 (two) times daily.       No current facility-administered medications for this visit.    OBJECTIVE: elderly white woman who appears well Filed Vitals:   02/05/15 1505  BP: 148/41  Pulse: 70  Temp: 98 F (36.7 C)  Resp: 18     Body mass index is 31.27 kg/(m^2).    ECOG FS:0 - Asymptomatic  Sclerae unicteric, pupils round and equal Oropharynx clear and moist-- no thrush or other lesions No cervical or supraclavicular adenopathy Lungs no rales or rhonchi Heart regular rate and rhythm Abd soft, nontender, positive bowel sounds MSK no focal spinal tenderness, no upper extremity lymphedema Neuro: nonfocal, well oriented, appropriate affect Breasts: The right breast is status post lumpectomy. There is no evidence of local recurrence. There is a minimal blush associated with this, which is not suggestive of cellulitis. The right axilla is benign. The left breast is  unremarkable  .  LAB RESULTS:  CMP     Component Value Date/Time   NA 145 09/18/2014 1245   NA 143 06/25/2013 0948   K 3.9 09/18/2014 1245   K 4.4 06/25/2013 0948   CL 108 06/25/2013 0948   CO2 25 09/18/2014 1245   CO2 27 06/25/2013 0948   GLUCOSE 107 09/18/2014 1245   GLUCOSE 100* 06/25/2013 0948   BUN 20.1 09/18/2014 1245   BUN 21 06/25/2013 0948   CREATININE 1.2* 09/18/2014 1245   CREATININE 1.2 06/25/2013 0948   CALCIUM 9.1 09/18/2014 1245   CALCIUM 9.2 06/25/2013 0948   PROT 6.8 09/18/2014 1245   PROT 6.8 06/25/2013 0948   ALBUMIN 3.8 09/18/2014 1245   ALBUMIN 3.9 06/25/2013 0948   AST 14 09/18/2014 1245   AST 17 06/25/2013 0948   ALT 8 09/18/2014 1245   ALT 12 06/25/2013 0948   ALKPHOS 100 09/18/2014 1245   ALKPHOS 78 06/25/2013 0948   BILITOT 0.49 09/18/2014 1245   BILITOT 0.7 06/25/2013 0948   GFRNONAA 50.02 04/14/2010 1043    INo results found for: SPEP, UPEP  Lab Results  Component Value Date   WBC 6.2 09/18/2014   NEUTROABS 4.6 09/18/2014   HGB 12.6 09/18/2014   HCT 38.5 09/18/2014   MCV 92.0 09/18/2014   PLT 199 09/18/2014      Chemistry      Component Value Date/Time   NA 145 09/18/2014 1245   NA 143 06/25/2013 0948   K 3.9 09/18/2014 1245   K 4.4 06/25/2013 0948   CL 108 06/25/2013 0948   CO2 25 09/18/2014 1245   CO2 27 06/25/2013 0948   BUN 20.1 09/18/2014 1245   BUN 21 06/25/2013 0948   CREATININE 1.2* 09/18/2014 1245   CREATININE 1.2 06/25/2013 0948      Component Value Date/Time   CALCIUM 9.1 09/18/2014 1245   CALCIUM 9.2 06/25/2013 0948   ALKPHOS 100 09/18/2014 1245   ALKPHOS 78 06/25/2013 0948   AST 14 09/18/2014 1245   AST 17 06/25/2013 0948   ALT 8 09/18/2014 1245   ALT 12 06/25/2013 0948   BILITOT 0.49 09/18/2014 1245   BILITOT 0.7 06/25/2013 0948       Lab Results  Component Value Date   LABCA2 34 05/20/2011    No components found for: [id number]  No results for input(s): INR in the last 168  hours.  Urinalysis  Component Value Date/Time   COLORURINE yellow 10/28/2009 0905   APPEARANCEUR Clear 10/28/2009 0905   LABSPEC 1.025 10/28/2009 0905   PHURINE 5.0 10/28/2009 0905   HGBUR trace-intact 10/28/2009 0905   BILIRUBINUR negative 10/28/2009 0905   UROBILINOGEN 0.2 10/28/2009 0905   NITRITE negative 10/28/2009 0905    STUDIES: No results found.  ASSESSMENT: 81 y.o. BRCA negative [place] resident   (1) status post bilateral lumpectomies without sentinel lymph node biopsy in August 2007 for a right-sided 7 mm, grade 2, invasive ductal carcinoma, and a left-sided 8 mm, grade 1, invasive ductal carcinoma.  Both tumors were ER positive, HER-2/neu negative, both with a low proliferation fraction.   (a) On Arimidex from September 2007 until May 2009, at which time we switched to tamoxifen primarily secondary to concerns regarding osteoporosis, completing five years of antiestrogen September 2011.   (b)  Also received Zometa yearly, discontinued after 4 doses in 2011  (2)  Status post right breast upper outer quadrant biopsy 09/11/2014 4 a 4.2 cm area of ductal carcinoma in situ, grade 2 or 3,  with extravasated mucin, estrogen and progesterone receptor positive   (3) status post right lumpectomy 10/01/2014 for a pT1a pNX, stage IA invasive ductal carcinoma, grade 2, estrogen  and progesterone receptor positive, HER-2 negative, with an MIB-1 of 38%. Margins were close but negative   (4) she will not need adjuvant radiation   (5) anastrozole to follow surgery  (a)  DEXA scan 10/31/2014 shows a T score of -1.8.  (6)  the BreastNext gene panel performed at [facility] 09/19/2014 showed no deleterious mutations in ATM, BARD1, BRCA1, BRCA2, BRIP1, CDH1, CHEK2, MRE11A, MUTYH, NBN, NF1, PALB2, PTEN, RAD50, RAD51C, RAD51D, or TP53.  PLAN: [name] is doing well from a breast cancer point of view and of course we do not expect this to be, problem for her in the future. She  will have her next mammogram in February. She is going to see me in March. From that point we will start seeing her on a once a year basis.  She still has occasionally irregular bowel movements with some diarrhea. I have strongly encouraged her to keep herself well-hydrated when that happens. If she has any difficulty with that she will let us know.  I also suggested she get an app to count her steps. I think this will be a great motivator for her to walk a little bit more. We will check on that at the next visit as well.  Taylor Cruel, MD   02/05/2015 3:19 PM Medical Oncology and Hematology [facility] [address] [place], [place] [address] Tel. [phone]    Fax. [phone]

## 2015-02-06 ENCOUNTER — Ambulatory Visit: Payer: MEDICARE | Admitting: Podiatry

## 2015-02-07 ENCOUNTER — Telehealth: Payer: Self-pay | Admitting: *Deleted

## 2015-02-07 NOTE — Telephone Encounter (Signed)
Oncology Nurse Navigator Documentation  Oncology Nurse Navigator Flowsheets 02/07/2015  Navigator Encounter Type Telephone;3 month  Patient Visit Type Follow-up  Barriers/Navigation Needs No barriers at this time  Interventions None required  Time Spent with Patient 15

## 2015-02-27 DIAGNOSIS — R194 Change in bowel habit: Secondary | ICD-10-CM | POA: Diagnosis not present

## 2015-02-27 DIAGNOSIS — R1310 Dysphagia, unspecified: Secondary | ICD-10-CM | POA: Diagnosis not present

## 2015-02-27 DIAGNOSIS — K449 Diaphragmatic hernia without obstruction or gangrene: Secondary | ICD-10-CM | POA: Diagnosis not present

## 2015-03-27 ENCOUNTER — Ambulatory Visit: Payer: MEDICARE | Admitting: Podiatry

## 2015-04-14 DIAGNOSIS — L218 Other seborrheic dermatitis: Secondary | ICD-10-CM | POA: Diagnosis not present

## 2015-04-14 DIAGNOSIS — L738 Other specified follicular disorders: Secondary | ICD-10-CM | POA: Diagnosis not present

## 2015-04-14 DIAGNOSIS — B029 Zoster without complications: Secondary | ICD-10-CM | POA: Diagnosis not present

## 2015-04-15 ENCOUNTER — Ambulatory Visit (INDEPENDENT_AMBULATORY_CARE_PROVIDER_SITE_OTHER): Payer: MEDICARE | Admitting: Podiatry

## 2015-04-15 ENCOUNTER — Encounter: Payer: Self-pay | Admitting: Podiatry

## 2015-04-15 VITALS — BP 127/50 | HR 79 | Resp 16

## 2015-04-15 DIAGNOSIS — M79673 Pain in unspecified foot: Secondary | ICD-10-CM

## 2015-04-15 DIAGNOSIS — Q828 Other specified congenital malformations of skin: Secondary | ICD-10-CM

## 2015-04-15 DIAGNOSIS — B351 Tinea unguium: Secondary | ICD-10-CM

## 2015-04-15 NOTE — Progress Notes (Signed)
She presents today with a chief complaint of painful elongated toenails 1 through 5 bilateral. And painful porokeratotic lesions plantar aspect of the bilateral foot. She states that she has truncal shingles at this point.  Objective: Vital signs are stable she's alert and oriented 3. Pulses are strongly palpable. She has pain on palpation of the toenails and porokeratotic lesions. Her nails are thick yellow dystrophic with mycotic and painful palpation. [address] keratomas subsecond metatarsophalangeal joints bilateral.  Assessment: Pain in limb secondary to onychomycosis and porokeratosis bilateral.  Plan: Discussed etiology pathology conservative versus surgical therapies.  Roselind Messier DPM

## 2015-04-29 DIAGNOSIS — B029 Zoster without complications: Secondary | ICD-10-CM | POA: Diagnosis not present

## 2015-05-06 ENCOUNTER — Other Ambulatory Visit: Payer: Self-pay | Admitting: *Deleted

## 2015-05-06 DIAGNOSIS — C50411 Malignant neoplasm of upper-outer quadrant of right female breast: Secondary | ICD-10-CM

## 2015-05-07 ENCOUNTER — Telehealth: Payer: Self-pay | Admitting: Oncology

## 2015-05-07 NOTE — Telephone Encounter (Signed)
Called patient and she is aware of her appointment °

## 2015-05-09 ENCOUNTER — Telehealth: Payer: Self-pay | Admitting: Nurse Practitioner

## 2015-05-09 NOTE — Telephone Encounter (Signed)
Called to speak with patient about upcoming appointment scheduled in Survivorship for 05/16/15 at 1:30pm.  Pt had previously refused Survivorship visit and a copy of Survivorship Care plan mailed to patient on 11/18/2014.  Left message for return call from patient to discuss status and determine need for upcoming appointment.

## 2015-05-13 ENCOUNTER — Telehealth: Payer: Self-pay | Admitting: Nurse Practitioner

## 2015-05-13 NOTE — Telephone Encounter (Signed)
Called and spoke with patient.  Survivorship care plan mailed to patient in May 2016 by Mike Craze NP.  Inquired whether patient had new issues or questions, which she stated she did not.  Will cancel appointment for Survivorship Care Plan review as patient already has document and has no questions.  I gave my contact information if any questions arise or if I can be of any help. She has follow up appointment in March 2017 with Dr. Jana Hakim.

## 2015-05-16 ENCOUNTER — Encounter: Payer: BLUE CROSS/BLUE SHIELD | Admitting: Nurse Practitioner

## 2015-06-05 ENCOUNTER — Ambulatory Visit (INDEPENDENT_AMBULATORY_CARE_PROVIDER_SITE_OTHER): Payer: MEDICARE

## 2015-06-05 DIAGNOSIS — Z23 Encounter for immunization: Secondary | ICD-10-CM | POA: Diagnosis not present

## 2015-06-17 ENCOUNTER — Encounter: Payer: Self-pay | Admitting: Podiatry

## 2015-06-17 ENCOUNTER — Ambulatory Visit (INDEPENDENT_AMBULATORY_CARE_PROVIDER_SITE_OTHER): Payer: MEDICARE | Admitting: Podiatry

## 2015-06-17 DIAGNOSIS — B351 Tinea unguium: Secondary | ICD-10-CM

## 2015-06-17 DIAGNOSIS — M79676 Pain in unspecified toe(s): Secondary | ICD-10-CM | POA: Diagnosis not present

## 2015-06-17 DIAGNOSIS — Q828 Other specified congenital malformations of skin: Secondary | ICD-10-CM | POA: Diagnosis not present

## 2015-06-17 NOTE — Progress Notes (Signed)
She presents today with a chief complaint of painful toes and calluses to the plantar aspect of bilateral foot she's also complaining of painful elongated toenails.  Objective: Vital signs are stable she is alert and oriented 3 pulses are strongly palpable. She has hammertoe deformities resulting in a distal clavus the second digit of the right foot. Her toenails are thick yellow dystrophic with mycotic and painful palpation. She also has multiple porokeratosis plantar aspect of the bilateral forefoot right greater than left.  Assessment: Pain and limb secondary to onychomycosis 1 through 5 bilateral. Painful porokeratosis bilateral.  Plan: Debridement of all reactive hyperkeratoses and placed padding. I also debrided her toenails 1 through 5 bilateral covered service secondary to pain.  Roselind Messier DPM

## 2015-07-03 DIAGNOSIS — H353132 Nonexudative age-related macular degeneration, bilateral, intermediate dry stage: Secondary | ICD-10-CM | POA: Diagnosis not present

## 2015-07-18 ENCOUNTER — Ambulatory Visit (INDEPENDENT_AMBULATORY_CARE_PROVIDER_SITE_OTHER): Payer: MEDICARE | Admitting: Family Medicine

## 2015-07-18 VITALS — BP 128/52 | HR 90 | Temp 98.4°F | Resp 17 | Ht 62.5 in | Wt 169.8 lb

## 2015-07-18 DIAGNOSIS — J029 Acute pharyngitis, unspecified: Secondary | ICD-10-CM | POA: Diagnosis not present

## 2015-07-18 DIAGNOSIS — R05 Cough: Secondary | ICD-10-CM

## 2015-07-18 DIAGNOSIS — J309 Allergic rhinitis, unspecified: Secondary | ICD-10-CM | POA: Diagnosis not present

## 2015-07-18 DIAGNOSIS — J0100 Acute maxillary sinusitis, unspecified: Secondary | ICD-10-CM

## 2015-07-18 DIAGNOSIS — R059 Cough, unspecified: Secondary | ICD-10-CM

## 2015-07-18 LAB — POCT RAPID STREP A (OFFICE): Rapid Strep A Screen: NEGATIVE

## 2015-07-18 MED ORDER — BENZONATATE 100 MG PO CAPS: 200.0000 mg | ORAL_CAPSULE | Freq: Three times a day (TID) | ORAL | Status: DC | PRN

## 2015-07-18 MED ORDER — AMOXICILLIN 500 MG PO CAPS: 500.0000 mg | ORAL_CAPSULE | Freq: Two times a day (BID) | ORAL | Status: DC

## 2015-07-18 NOTE — Progress Notes (Signed)
Chief Complaint:  Chief Complaint  Patient presents with  . Cough    X Tuesday  . Sore Throat    X Tuesday    HPI: [name] is a 81 y.o. female who reports to [facility] today complaining of  some frontal HA, cough, sinus congestion no ear pain, no fevers or chills. No other sxs.  No nv diarrhe aor crashes. Just cough.  + sore throat, dry throat. No SOB or wheezing or CP. HX of breast ca. Has allergies but allergy meds are not working.   Past Medical History  Diagnosis Date  . Allergic rhinitis, seasonal   . Osteoarthritis   . Menopausal syndrome   . HX: breast cancer     bilateral  . Osteoporosis   . Goiter     right thyroid nodule   . Squamous cell skin cancer     right lower leg  . Chronic venous insufficiency   . Dermatophytosis of nail   . Breast cancer of upper-outer quadrant of right female breast ([place]) 09/13/2014  . Family history of malignant neoplasm of breast   . Family history of malignant neoplasm of ovary   . Wears glasses   . Wears hearing aid     both ears  . Edema leg    Past Surgical History  Procedure Laterality Date  . Left eardum sx  1983  . Cataract extraction    . Tonsillectomy    . Dilation and curettage of uterus    . Umbilical hernia repair    . Bilateral lumpectomies for breast cancer  Aug. 2007  . Left knee arthroscopic surgery    . History of lumbar compression fracture    . Status post resection squamous cell cancer right lower leg    . No screening colonoscopy    . S/p bilateral lumpectomies    . Esophagogastroduodenoscopy  07/26/2011    Procedure: ESOPHAGOGASTRODUODENOSCOPY (EGD);  Surgeon: Jeryl Columbia, MD;  Location: [facility];  Service: Endoscopy;  Laterality: N/A;  . Savory dilation  07/26/2011    Procedure: SAVORY DILATION;  Surgeon: Jeryl Columbia, MD;  Location: [facility];  Service: Endoscopy;  Laterality: N/A;  . Breast lumpectomy with radioactive seed localization Right 10/01/2014    Procedure: BREAST LUMPECTOMY  WITH RADIOACTIVE SEED LOCALIZATION;  Surgeon: Erroll Luna, MD;  Location: [facility];  Service: General;  Laterality: Right;   Social History   Social History  . Marital Status: Married    Spouse Name: N/A  . Number of Children: N/A  . Years of Education: N/A   Social History Main Topics  . Smoking status: Never Smoker   . Smokeless tobacco: Never Used  . Alcohol Use: No  . Drug Use: No  . Sexual Activity: Not Asked   Other Topics Concern  . None   Social History Narrative   Family History  Problem Relation Age of Onset  . Cancer Mother 55    breast  . Heart attack Father   . Heart attack Brother   . Cancer Maternal Aunt 55    breast  . Coronary artery disease Other   . Cancer Other     ovarian; daughter of mat aunt w/ BC in 58s  . Heart attack Brother   . Heart disease Brother   . Cancer Maternal Aunt 60    breast   Allergies  Allergen Reactions  . Celecoxib     REACTION: Swelling-leg   Prior to  Admission medications   Medication Sig Start Date End Date Taking? Authorizing Provider  Calcium Carb-Cholecalciferol (CALCIUM 600 + D PO) Take by mouth daily.   Yes Historical Provider, MD  cycloSPORINE (RESTASIS) 0.05 % ophthalmic emulsion Place 1 drop into both eyes 2 (two) times daily.   Yes Historical Provider, MD  fexofenadine (ALLEGRA) 180 MG tablet Take 1 tablet (180 mg total) by mouth daily. 02/09/13  Yes Marletta Lor, MD  fluticasone [facility]) 50 MCG/ACT nasal spray Place 2 sprays into the nose daily. 02/09/13  Yes Marletta Lor, MD  furosemide (LASIX) 40 MG tablet Take 1 tablet (40 mg total) by mouth daily. Patient taking differently: Take 40 mg by mouth daily. As needed 02/09/13  Yes Marletta Lor, MD  pantoprazole (PROTONIX) 40 MG tablet Take 40 mg by mouth daily.  05/09/13  Yes Historical Provider, MD  HYDROcodone-acetaminophen (NORCO) 5-325 MG per tablet Take 1 tablet by mouth every 6 (six) hours as needed for moderate  pain. Patient not taking: Reported on 07/18/2015 10/01/14   Erroll Luna, MD  triamcinolone (KENALOG) 0.1 % cream Apply 1 application topically 2 (two) times daily. Reported on 07/18/2015    Historical Provider, MD     ROS: The patient denies fevers, chills, night sweats, unintentional weight loss, chest pain, palpitations, wheezing, dyspnea on exertion, nausea, vomiting, abdominal pain, dysuria, hematuria, melena, numbness, weakness, or tingling.   All other systems have been reviewed and were otherwise negative with the exception of those mentioned in the HPI and as above.    PHYSICAL EXAM: Filed Vitals:   07/18/15 1034  BP: 128/52  Pulse: 90  Temp: 98.4 F (36.9 C)  Resp: 17   SpO2 Readings from Last 3 Encounters:  07/18/15 98%  10/01/14 99%  07/25/14 98%    Body mass index is 30.54 kg/(m^2).   General: Alert, no acute distress HEENT:  Normocephalic, atraumatic, oropharynx patent. EOMI, PERRLA Erythematous throat, no exudates, TM normal, + sinus tenderness, + erythematous/boggy nasal mucosa Cardiovascular:  Regular rate and rhythm, no rubs murmurs or gallops.  No Carotid bruits, radial pulse intact. No pedal edema.  Respiratory: Clear to auscultation bilaterally.  No wheezes, rales, or rhonchi.  No cyanosis, no use of accessory musculature Abdominal: No organomegaly, abdomen is soft and non-tender, positive bowel sounds. No masses. Skin: No rashes. Neurologic: Facial musculature symmetric. Psychiatric: Patient acts appropriately throughout our interaction. Lymphatic: No cervical or submandibular lymphadenopathy Musculoskeletal: Gait intact. No edema, tenderness   LABS: Results for orders placed or performed in visit on 07/18/15  POCT rapid strep A  Result Value Ref Range   Rapid Strep A Screen Negative Negative     EKG/XRAY:   Primary read interpreted by Dr. Marin Comment at [facility].   ASSESSMENT/PLAN: Encounter Diagnoses  Name Primary?  . Acute pharyngitis, unspecified  pharyngitis type   . Acute maxillary sinusitis, recurrence not specified Yes  . Cough   . Allergic rhinitis, unspecified allergic rhinitis type    Con with sxs treatment and allergy meds, allegra and flonase Rx tessalon perles, if no improvement then take amoxacillin Fu prn   Gross sideeffects, risk and benefits, and alternatives of medications d/w patient. Patient is aware that all medications have potential sideeffects and we are unable to predict every sideeffect or drug-drug interaction that may occur.  Thao Le DO  07/20/2015 12:40 PM

## 2015-07-18 NOTE — Patient Instructions (Signed)

## 2015-07-29 ENCOUNTER — Encounter: Payer: MEDICARE | Admitting: Internal Medicine

## 2015-08-07 ENCOUNTER — Encounter: Payer: Self-pay | Admitting: Internal Medicine

## 2015-08-07 ENCOUNTER — Ambulatory Visit (INDEPENDENT_AMBULATORY_CARE_PROVIDER_SITE_OTHER): Payer: MEDICARE | Admitting: Internal Medicine

## 2015-08-07 VITALS — BP 140/80 | HR 75 | Temp 98.8°F | Resp 20 | Ht 62.5 in | Wt 170.0 lb

## 2015-08-07 DIAGNOSIS — C50411 Malignant neoplasm of upper-outer quadrant of right female breast: Secondary | ICD-10-CM

## 2015-08-07 DIAGNOSIS — E042 Nontoxic multinodular goiter: Secondary | ICD-10-CM | POA: Diagnosis not present

## 2015-08-07 DIAGNOSIS — Z8719 Personal history of other diseases of the digestive system: Secondary | ICD-10-CM

## 2015-08-07 DIAGNOSIS — M159 Polyosteoarthritis, unspecified: Secondary | ICD-10-CM

## 2015-08-07 DIAGNOSIS — J3089 Other allergic rhinitis: Secondary | ICD-10-CM | POA: Diagnosis not present

## 2015-08-07 DIAGNOSIS — I872 Venous insufficiency (chronic) (peripheral): Secondary | ICD-10-CM

## 2015-08-07 DIAGNOSIS — M150 Primary generalized (osteo)arthritis: Secondary | ICD-10-CM

## 2015-08-07 DIAGNOSIS — Z0000 Encounter for general adult medical examination without abnormal findings: Secondary | ICD-10-CM

## 2015-08-07 MED ORDER — NYSTATIN-TRIAMCINOLONE 100000-0.1 UNIT/GM-% EX CREA: 1.0000 "application " | TOPICAL_CREAM | Freq: Two times a day (BID) | CUTANEOUS | Status: DC

## 2015-08-07 NOTE — Progress Notes (Signed)
Patient ID: [name], female   DOB: [birth year], 82 y.o.   MRN: [id number]  Subjective:    Patient ID: [name], female    DOB: [birth year], 82 y.o.   MRN: [id number]  HPI Pre-visit discussion using our clinic review tool. No additional management support is needed unless otherwise documented below in the visit note.   CC: cpx - doing well.   History of Present Illness:   82  year-old patient who is seen today for a wellness exam.  She has a history of breast cancer, and senile osteoporosis, and is followed by oncology annually. She is doing quite well. Today. She has a history of osteoarthritis, allergic rhinitis , mild chronic venous insufficiency. She has done quite well and denies any cardiopulmonary complaints.   Here for Medicare AWV:   1. Risk factors based on Past M, S, F history:  factors include aging. Denies any cardiopulmonary complaints  2. Physical Activities: No restrictions 3. Depression/mood: history depression, or mood disorder  4. Hearing:  Patient now wears bilateral hearing 5. ADL's: independent in all aspects of daily living  6. Fall Risk: moderate due to age  37. Home Safety: no problems identified  8. Height, weight, &visual acuity:height and weight stable. No change in visual acuity does receive annual reclast injections  9. Counseling: oncology follow-up recommended  10. Labs ordered based on risk factors: laboratory panel will be reviewed  11. Referral Coordination- oncology referral  12. Care Plan- heart healthy diet more regular exercise. Encouraged  13. Cognitive Assessment- alert and oriented normal affect. No memory disturbance. Able to handle all executive functioning without difficulty  14.  Preventive services will include annual health examinations with screening lab.  She will consider annual mammograms and oncology follow-up.  Patient was provided with a written and personalized care plan 15.  Provider list update includes primary care GI  oncology and radiology  Preventive Screening-Counseling & Management  Alcohol-Tobacco  Smoking Status: never   Allergies:   1) ! Celebrex   Past History:   Allergic rhinitis,seasonal  Osteoarthritis  Menopausal syndrome  Breast cancer, hx of bilateral August 2007  history of squamous cell skin cancer right lower leg  chronic venous insufficiency  Osteoporosis  Allergic rhinitis  Goiter/ R thyroid nodule   Past Surgical History:  Lt eardrum sx-1983  Cataract extraction  Tonsillectomy  D&C  Umbilical hernia repair  bilateral lumpectomies for breast cancer in August 2007  left knee arthroscopic surgery  history of lumbar compression fracture  status post resection squamous cell cancer right lower leg  no screening colonoscopy  s/p bilat lumpectomies  Endoscopy  January 2013   Family History:   Fam hx CAD  Fam hx Breast CA  Erythrodermatitis  father died age 61 MI  mother died age 68. History breast cancer  two aunts with the breast cancer  one aunt with ovarian cancer  Two half-brothers died of MIs  one brother history of coronary artery disease  One sister deceased, unclear causes   Social History:   Married  Never Smoked  Moved  into [facility] in November [place]  Past Medical History  Diagnosis Date  . Allergic rhinitis, seasonal   . Osteoarthritis   . Menopausal syndrome   . HX: breast cancer     bilateral  . Osteoporosis   . Goiter     right thyroid nodule   . Squamous cell skin cancer     right lower leg  . Chronic venous  insufficiency   . Dermatophytosis of nail   . Breast cancer of upper-outer quadrant of right female breast ([place]) 09/13/2014  . Family history of malignant neoplasm of breast   . Family history of malignant neoplasm of ovary   . Wears glasses   . Wears hearing aid     both ears  . Edema leg     Social History   Social History  . Marital Status: Married    Spouse Name: N/A  . Number of Children: N/A  . Years of  Education: N/A   Occupational History  . Not on file.   Social History Main Topics  . Smoking status: Never Smoker   . Smokeless tobacco: Never Used  . Alcohol Use: No  . Drug Use: No  . Sexual Activity: Not on file   Other Topics Concern  . Not on file   Social History Narrative    Past Surgical History  Procedure Laterality Date  . Left eardum sx  1983  . Cataract extraction    . Tonsillectomy    . Dilation and curettage of uterus    . Umbilical hernia repair    . Bilateral lumpectomies for breast cancer  Aug. 2007  . Left knee arthroscopic surgery    . History of lumbar compression fracture    . Status post resection squamous cell cancer right lower leg    . No screening colonoscopy    . S/p bilateral lumpectomies    . Esophagogastroduodenoscopy  07/26/2011    Procedure: ESOPHAGOGASTRODUODENOSCOPY (EGD);  Surgeon: Jeryl Columbia, MD;  Location: [facility];  Service: Endoscopy;  Laterality: N/A;  . Savory dilation  07/26/2011    Procedure: SAVORY DILATION;  Surgeon: Jeryl Columbia, MD;  Location: [facility];  Service: Endoscopy;  Laterality: N/A;  . Breast lumpectomy with radioactive seed localization Right 10/01/2014    Procedure: BREAST LUMPECTOMY WITH RADIOACTIVE SEED LOCALIZATION;  Surgeon: Erroll Luna, MD;  Location: [facility];  Service: General;  Laterality: Right;    Family History  Problem Relation Age of Onset  . Cancer Mother 64    breast  . Heart attack Father   . Heart attack Brother   . Cancer Maternal Aunt 55    breast  . Coronary artery disease Other   . Cancer Other     ovarian; daughter of mat aunt w/ BC in 53s  . Heart attack Brother   . Heart disease Brother   . Cancer Maternal Aunt 60    breast    Allergies  Allergen Reactions  . Celecoxib     REACTION: Swelling-leg    Current Outpatient Prescriptions on File Prior to Visit  Medication Sig Dispense Refill  . Calcium Carb-Cholecalciferol (CALCIUM 600 + D PO) Take by  mouth daily.    . fexofenadine (ALLEGRA) 180 MG tablet Take 1 tablet (180 mg total) by mouth daily. 60 tablet 3  . fluticasone (FLONASE) 50 MCG/ACT nasal spray Place 2 sprays into the nose daily. 16 g 6  . furosemide (LASIX) 40 MG tablet Take 1 tablet (40 mg total) by mouth daily. (Patient taking differently: Take 40 mg by mouth daily. As needed) 90 tablet 3  . pantoprazole (PROTONIX) 40 MG tablet Take 40 mg by mouth daily.     Marland Kitchen triamcinolone (KENALOG) 0.1 % cream Apply 1 application topically 2 (two) times daily. Reported on 07/18/2015    . cycloSPORINE (RESTASIS) 0.05 % ophthalmic emulsion Place 1 drop into both eyes 2 (two)  times daily. Reported on 08/07/2015     No current facility-administered medications on file prior to visit.    BP 140/80 mmHg  Pulse 75  Temp(Src) 98.8 F (37.1 C) (Oral)  Resp 20  Ht 5' 2.5" (1.588 m)  Wt 170 lb (77.111 kg)  BMI 30.58 kg/m2  SpO2 98%      Review of Systems  Constitutional: Negative for fever, appetite change, fatigue and unexpected weight change.  HENT: Positive for hearing loss. Negative for congestion, dental problem, ear pain, mouth sores, nosebleeds, sinus pressure, sore throat, tinnitus, trouble swallowing and voice change.   Eyes: Negative for photophobia, pain, redness and visual disturbance.  Respiratory: Negative for cough, chest tightness and shortness of breath.   Cardiovascular: Negative for chest pain, palpitations and leg swelling.  Gastrointestinal: Negative for nausea, vomiting, abdominal pain, diarrhea, constipation, blood in stool, abdominal distention and rectal pain.  Genitourinary: Negative for dysuria, urgency, frequency, hematuria, flank pain, vaginal bleeding, vaginal discharge, difficulty urinating, genital sores, vaginal pain, menstrual problem and pelvic pain.  Musculoskeletal: Negative for back pain, arthralgias and neck stiffness.  Skin: Negative for rash.  Neurological: Negative for dizziness, syncope, speech  difficulty, weakness, light-headedness, numbness and headaches.  Hematological: Negative for adenopathy. Does not bruise/bleed easily.  Psychiatric/Behavioral: Negative for suicidal ideas, behavioral problems, self-injury, dysphoric mood and agitation. The patient is not nervous/anxious.        Objective:   Physical Exam  Constitutional: She is oriented to person, place, and time. She appears well-developed and well-nourished.  HENT:  Head: Normocephalic and atraumatic.  Right Ear: External ear normal.  Left Ear: External ear normal.  Mouth/Throat: Oropharynx is clear and moist.  Bilateral hearing aids  Eyes: Conjunctivae and EOM are normal.  Arcus senilis  Neck: Normal range of motion. Neck supple. No JVD present. No thyromegaly present.  Cardiovascular: Normal rate, regular rhythm and intact distal pulses.   Murmur heard. Grade 2/6 high-pitched systolic murmur Dorsalis pedis pulses full.  Posterior tibial pulses faint  Pulmonary/Chest: Effort normal and breath sounds normal. She has no wheezes. She has no rales.  Right partial mastectomy  Abdominal: Soft. Bowel sounds are normal. She exhibits no distension and no mass. There is no tenderness. There is no rebound and no guarding.  Musculoskeletal: Normal range of motion. She exhibits no edema or tenderness.  Neurological: She is alert and oriented to person, place, and time. She has normal reflexes. No cranial nerve deficit. She exhibits normal muscle tone. Coordination normal.  Skin: Skin is warm and dry. No rash noted.  Psychiatric: She has a normal mood and affect. Her behavior is normal.          Assessment & Plan:   Preventive health examination Senile osteoporosis. We'll consider further treatment with oncology next month. We'll continue calcium and vitamin D supplements History breast cancer. Follow up in oncology will check screening labs today  Osteoarthritis stable Allergic rhinitis  Laboratory update  Recheck 1  year

## 2015-08-07 NOTE — Progress Notes (Signed)
Pre visit review using our clinic review tool, if applicable. No additional management support is needed unless otherwise documented below in the visit note. 

## 2015-08-07 NOTE — Patient Instructions (Signed)
Take a calcium supplement, plus [phone] units of vitamin D    It is important that you exercise regularly, at least 20 minutes 3 to 4 times per week.  If you develop chest pain or shortness of breath seek  medical attention.  Oncology follow-up as scheduled  Return here in one year or as needed

## 2015-08-19 ENCOUNTER — Encounter: Payer: Self-pay | Admitting: Podiatry

## 2015-08-19 ENCOUNTER — Ambulatory Visit (INDEPENDENT_AMBULATORY_CARE_PROVIDER_SITE_OTHER): Payer: MEDICARE | Admitting: Podiatry

## 2015-08-19 DIAGNOSIS — Q828 Other specified congenital malformations of skin: Secondary | ICD-10-CM

## 2015-08-19 DIAGNOSIS — B351 Tinea unguium: Secondary | ICD-10-CM

## 2015-08-19 DIAGNOSIS — M79676 Pain in unspecified toe(s): Secondary | ICD-10-CM | POA: Diagnosis not present

## 2015-08-19 NOTE — Progress Notes (Signed)
She presents today with a chief complaint of painful elongated toenails 1 through 5 bilateral as well as painful calluses plantar aspect of bilateral foot.   Objective: vital signs stable alert and oriented 3. Pulses are palpable. Toenails are thick yellow dystrophic likely mycotic and painful palpation. Solitary porokeratosis are also noted bilateral. No open lesions or wounds.  Assessment: pain in limb secondary to onychomycosis and porokeratosis bilateral.  Plan: debridement of toenails and debridement of all reactive hyperkeratotic lesions follow up with me as needed.

## 2015-09-23 ENCOUNTER — Ambulatory Visit (HOSPITAL_BASED_OUTPATIENT_CLINIC_OR_DEPARTMENT_OTHER): Payer: MEDICARE | Admitting: Oncology

## 2015-09-23 VITALS — BP 158/58 | HR 90 | Temp 98.1°F | Resp 18 | Ht 62.5 in | Wt 173.2 lb

## 2015-09-23 DIAGNOSIS — Z170 Estrogen receptor positive status [ER+]: Secondary | ICD-10-CM

## 2015-09-23 DIAGNOSIS — Z853 Personal history of malignant neoplasm of breast: Secondary | ICD-10-CM | POA: Diagnosis not present

## 2015-09-23 DIAGNOSIS — D0511 Intraductal carcinoma in situ of right breast: Secondary | ICD-10-CM | POA: Diagnosis not present

## 2015-09-23 DIAGNOSIS — E038 Other specified hypothyroidism: Secondary | ICD-10-CM

## 2015-09-23 DIAGNOSIS — C50411 Malignant neoplasm of upper-outer quadrant of right female breast: Secondary | ICD-10-CM

## 2015-09-23 LAB — HM MAMMOGRAPHY

## 2015-09-23 NOTE — Progress Notes (Signed)
[facility]  [phone] [phone]     ID: [name] DOB: 21-Sep-1976  MR#: [id number]  QQV#:[id number]  Patient Care Team: Marletta Lor, MD as PCP - General Erroll Luna, MD as Consulting Physician (General Surgery) Chauncey Cruel, MD as Consulting Physician (Oncology) Arloa Koh, MD as Consulting Physician (Radiation Oncology) [name], RN as Registered Nurse [name], RN as Registered Nurse [name], NP as Nurse Practitioner (Nurse Practitioner) [name], MD as Consulting Physician (Gastroenterology) OTHER MD:  CHIEF COMPLAINT: ductal carcinoma in situ, right breast  CURRENT TREATMENT: observation   BREAST CANCER HISTORY: From the earlier summary note:  I saw Mariaguadalupe previously for a history of bilateral breast cancers, treated with bilateral lumpectomies and anti-estrogens for 5 years. She did not receive radiation treatments. She was last seen here in 2011.  She had her annual screening mammography at [facility] 08[facility], and this showed the breast density to be category B. There was an area of new grouped heterogeneous calcifications in the right breast at the 11:00 middle depth. A focal asymmetric density anterior to that area was seen in one view only. Additional views were recommended, but if they were performed I do not have those records. There appears to have been mammography on 03/05/2014, but again I do not have those records.  On 08/30/2014, bilateral diagnostic mammography with tomosynthesis this was obtained. The calcifications at the 11:00 position in the right breast were increased in number. There were no other significant findings. Biopsy of the area in question to 20 11/06/2014 showed ([id number]) high-grade ductal carcinoma in situ, with mucin extravasation.no definitive invasive component was seen however. This high-grade noninvasive tumor was estrogen receptor positive at 100%,  progesterone receptor positive at 44%, both with strong staining intensity.  Her subsequent history is as detailed below  INTERVAL HISTORY: Leaira returns today for follow-up of her early stage breast cancer accompanied by her husband [name]. The interval history is very stable. [facility]. After the last visit here they started counting steps and they found that from there apartment to the dining hall is 675 steps 1 way. They don't otherwise exercise but they're very active and other functions at [facility] including the theater programs where Fritz Pickerel, with this last name of Moates, played the [place] [employer] Thanksgiving skin . REVIEW OF SYSTEMS: A detailed review of systems today was otherwise stable  PAST MEDICAL HISTORY: Past Medical History  Diagnosis Date  . Allergic rhinitis, seasonal   . Osteoarthritis   . Menopausal syndrome   . HX: breast cancer     bilateral  . Osteoporosis   . Goiter     right thyroid nodule   . Squamous cell skin cancer     right lower leg  . Chronic venous insufficiency   . Dermatophytosis of nail   . Breast cancer of upper-outer quadrant of right female breast ([place]) 09/13/2014  . Family history of malignant neoplasm of breast   . Family history of malignant neoplasm of ovary   . Wears glasses   . Wears hearing aid     both ears  . Edema leg     PAST SURGICAL HISTORY: Past Surgical History  Procedure Laterality Date  . Left eardum sx  1983  . Cataract extraction    . Tonsillectomy    . Dilation and curettage of uterus    . Umbilical hernia repair    . Bilateral lumpectomies for breast  cancer  Aug. 2007  . Left knee arthroscopic surgery    . History of lumbar compression fracture    . Status post resection squamous cell cancer right lower leg    . No screening colonoscopy    . S/p bilateral lumpectomies    . Esophagogastroduodenoscopy  07/26/2011    Procedure: ESOPHAGOGASTRODUODENOSCOPY (EGD);  Surgeon: Jeryl Columbia, MD;   Location: [facility];  Service: Endoscopy;  Laterality: N/A;  . Savory dilation  07/26/2011    Procedure: SAVORY DILATION;  Surgeon: Jeryl Columbia, MD;  Location: [facility];  Service: Endoscopy;  Laterality: N/A;  . Breast lumpectomy with radioactive seed localization Right 10/01/2014    Procedure: BREAST LUMPECTOMY WITH RADIOACTIVE SEED LOCALIZATION;  Surgeon: Erroll Luna, MD;  Location: [facility];  Service: General;  Laterality: Right;    FAMILY HISTORY Family History  Problem Relation Age of Onset  . Cancer Mother 66    breast  . Heart attack Father   . Heart attack Brother   . Cancer Maternal Aunt 55    breast  . Coronary artery disease Other   . Cancer Other     ovarian; daughter of mat aunt w/ BC in 40s  . Heart attack Brother   . Heart disease Brother   . Cancer Maternal Aunt 16    breast  the patient's father died at age 35 from a heart attack. The patient's mother died at age 60. The patient's mother was one of 34 sisters, 9 siblings. 2 of the sisters had breast cancer, postmenopausal. Lujain Kraszewski self had 2 half-brothers and 1 full brother as well as one half-sister.There is no history of ovarian cancer in the familyexcept as noted.  GYNECOLOGIC HISTORY:  No LMP recorded. Patient is postmenopausal. Menarche age 82, the patient is GX P0. 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 Her husband [name] is a former Programme researcher, broadcasting/film/video. They currently reside at [facility] [place], with no pets.    ADVANCED DIRECTIVES: in place   HEALTH MAINTENANCE: Social History  Substance Use Topics  . Smoking status: Never Smoker   . Smokeless tobacco: Never Used  . Alcohol Use: No     Colonoscopy:  PAP:  Bone density:  Lipid panel:  Allergies  Allergen Reactions  . Celecoxib     REACTION: Swelling-leg     Current Outpatient Prescriptions  Medication Sig Dispense Refill  . Calcium Carb-Cholecalciferol (CALCIUM 600 + D PO) Take by mouth daily.    . cycloSPORINE (RESTASIS) 0.05 % ophthalmic emulsion Place 1 drop into both eyes 2 (two) times daily. Reported on 08/07/2015    . fexofenadine (ALLEGRA) 180 MG tablet Take 1 tablet (180 mg total) by mouth daily. 60 tablet 3  . fluticasone (FLONASE) 50 MCG/ACT nasal spray Place 2 sprays into the nose daily. 16 g 6  . furosemide (LASIX) 40 MG tablet Take 1 tablet (40 mg total) by mouth daily. (Patient taking differently: Take 40 mg by mouth daily. As needed) 90 tablet 3  . Multiple Vitamins-Minerals (PRESERVISION AREDS 2) CAPS Take 1 capsule by mouth 2 (two) times daily.    Marland Kitchen nystatin-triamcinolone (MYCOLOG II) cream Apply 1 application topically 2 (two) times daily. 30 g 2  . pantoprazole (PROTONIX) 40 MG tablet Take 40 mg by mouth daily.     Marland Kitchen triamcinolone (KENALOG) 0.1 % cream Apply 1  application topically 2 (two) times daily. Reported on 07/18/2015     No current facility-administered medications for this visit.    OBJECTIVE: elderly white womanIn no acute distress Filed Vitals:   09/23/15 1435  BP: 158/58  Pulse: 90  Temp: 98.1 F (36.7 C)  Resp: 18     Body mass index is 31.15 kg/(m^2).    ECOG FS:1 - Symptomatic but completely ambulatory  Sclerae unicteric, EOMs intact Oropharynx clear, dentition in good repair No cervical or supraclavicular adenopathy Lungs no rales or rhonchi Heart regular rate and rhythm Abd soft, nontender, positive bowel sounds MSK no focal spinal tenderness, no upper extremity lymphedema Neuro: nonfocal, well oriented, appropriate affect Breasts: The right breast is status post lumpectomy, with no significant distortion of the profile; there is no evidence of disease recurrence; the left axilla is benign. The left breast is unremarkable  LAB RESULTS:  CMP     Component Value Date/Time   NA 145 09/18/2014  1245   NA 143 06/25/2013 0948   K 3.9 09/18/2014 1245   K 4.4 06/25/2013 0948   CL 108 06/25/2013 0948   CO2 25 09/18/2014 1245   CO2 27 06/25/2013 0948   GLUCOSE 107 09/18/2014 1245   GLUCOSE 100* 06/25/2013 0948   BUN 20.1 09/18/2014 1245   BUN 21 06/25/2013 0948   CREATININE 1.2* 09/18/2014 1245   CREATININE 1.2 06/25/2013 0948   CALCIUM 9.1 09/18/2014 1245   CALCIUM 9.2 06/25/2013 0948   PROT 6.8 09/18/2014 1245   PROT 6.8 06/25/2013 0948   ALBUMIN 3.8 09/18/2014 1245   ALBUMIN 3.9 06/25/2013 0948   AST 14 09/18/2014 1245   AST 17 06/25/2013 0948   ALT 8 09/18/2014 1245   ALT 12 06/25/2013 0948   ALKPHOS 100 09/18/2014 1245   ALKPHOS 78 06/25/2013 0948   BILITOT 0.49 09/18/2014 1245   BILITOT 0.7 06/25/2013 0948   GFRNONAA 50.02 04/14/2010 1043    INo results found for: SPEP, UPEP  Lab Results  Component Value Date   WBC 6.2 09/18/2014   NEUTROABS 4.6 09/18/2014   HGB 12.6 09/18/2014   HCT 38.5 09/18/2014   MCV 92.0 09/18/2014   PLT 199 09/18/2014      Chemistry      Component Value Date/Time   NA 145 09/18/2014 1245   NA 143 06/25/2013 0948   K 3.9 09/18/2014 1245   K 4.4 06/25/2013 0948   CL 108 06/25/2013 0948   CO2 25 09/18/2014 1245   CO2 27 06/25/2013 0948   BUN 20.1 09/18/2014 1245   BUN 21 06/25/2013 0948   CREATININE 1.2* 09/18/2014 1245   CREATININE 1.2 06/25/2013 0948      Component Value Date/Time   CALCIUM 9.1 09/18/2014 1245   CALCIUM 9.2 06/25/2013 0948   ALKPHOS 100 09/18/2014 1245   ALKPHOS 78 06/25/2013 0948   AST 14 09/18/2014 1245   AST 17 06/25/2013 0948   ALT 8 09/18/2014 1245   ALT 12 06/25/2013 0948   BILITOT 0.49 09/18/2014 1245   BILITOT 0.7 06/25/2013 0948       Lab Results  Component Value Date   LABCA2 34 05/20/2011    No components found for: [id number]  No results for input(s): INR in the last 168 hours.  Urinalysis    Component Value Date/Time   COLORURINE yellow 10/28/2009 0905   APPEARANCEUR Clear  10/28/2009 0905   LABSPEC 1.025 10/28/2009 0905   PHURINE 5.0 10/28/2009 0905   HGBUR trace-intact 10/28/2009 9470  BILIRUBINUR negative 10/28/2009 0905   UROBILINOGEN 0.2 10/28/2009 0905   NITRITE negative 10/28/2009 0905    STUDIES: Mammography 09/23/2015 shows a questionable area of calcification which will require six-month follow-up  ASSESSMENT: 82 y.o. BRCA negative [place] resident   (1) status post bilateral lumpectomies without sentinel lymph node biopsy in August 2007 for a right-sided 7 mm, grade 2, invasive ductal carcinoma, and a left-sided 8 mm, grade 1, invasive ductal carcinoma.  Both tumors were ER positive, HER-2/neu negative, both with a low proliferation fraction.   (a) On Arimidex from September 2007 until May 2009, at which time we switched to tamoxifen primarily secondary to concerns regarding osteoporosis, completing five years of antiestrogen September 2011.   (b)  Also received Zometa yearly, discontinued after 4 doses in 2011  (2)  Status post right breast upper outer quadrant biopsy 09/11/2014 4 a 4.2 cm area of ductal carcinoma in situ, grade 2 or 3,  with extravasated mucin, estrogen and progesterone receptor positive   (3) status post right lumpectomy 10/01/2014 for a pT1a pNX, stage IA invasive ductal carcinoma, grade 2, estrogen  and progesterone receptor positive, HER-2 negative, with an MIB-1 of 38%. Margins were close but negative   (4) she did not need adjuvant radiation   (5) opted against anti-estrogens  (a)  DEXA scan 10/31/2014 shows a T score of -1.8.  (6)  the BreastNext gene panel performed at [facility] 09/19/2014 showed no deleterious mutations in ATM, BARD1, BRCA1, BRCA2, BRIP1, CDH1, CHEK2, MRE11A, MUTYH, NBN, NF1, PALB2, PTEN, RAD50, RAD51C, RAD51D, or TP53.  PLAN: Merrisa is now a year out from her most recent surgery for breast cancer and doing well. There is no evidence of disease activity. She is going to return to see me  again in one year.  In the meantime I encouraged her to keep up her step counting and walking exercises. Intellectually she remains very alert. They have many young friends that the help raise when there were younger, even though the don't have children of the round, and they still come to visit.  She knows to call for any other problems that may develop before her next visit here.  Chauncey Cruel, MD   09/23/2015 2:51 PM Medical Oncology and Hematology [facility] [address] [place], [place] [address] Tel. [phone]    Fax. [phone]

## 2015-09-24 ENCOUNTER — Telehealth: Payer: Self-pay | Admitting: Oncology

## 2015-09-24 NOTE — Telephone Encounter (Signed)
Left message for patient to inform her of one year f/u appt March 2018

## 2015-09-25 ENCOUNTER — Encounter: Payer: Self-pay | Admitting: Internal Medicine

## 2015-11-11 ENCOUNTER — Ambulatory Visit: Payer: MEDICARE | Admitting: Podiatry

## 2015-11-13 ENCOUNTER — Ambulatory Visit (INDEPENDENT_AMBULATORY_CARE_PROVIDER_SITE_OTHER): Payer: MEDICARE | Admitting: Podiatry

## 2015-11-13 ENCOUNTER — Encounter: Payer: Self-pay | Admitting: Podiatry

## 2015-11-13 DIAGNOSIS — M79676 Pain in unspecified toe(s): Secondary | ICD-10-CM | POA: Diagnosis not present

## 2015-11-13 DIAGNOSIS — B351 Tinea unguium: Secondary | ICD-10-CM | POA: Diagnosis not present

## 2015-11-13 DIAGNOSIS — Q828 Other specified congenital malformations of skin: Secondary | ICD-10-CM | POA: Diagnosis not present

## 2015-11-13 NOTE — Progress Notes (Signed)
She presents today chief complaint of painful calluses bilateral as well as painful elongated toenails.  Objective: Vital signs are stable alert and oriented 3. Pulses are palpable bilateral. Toenails are thick yellow dystrophic clinically mycotic and painful on palpation. Multiple porokeratotic lesions plantar aspect of the bilateral foot are painful.  Assessment: Porokeratosis bilateral pain in limb secondary to onychomycosis.  Plan: Debridement of all reactive hyperkeratoses and debridement of toenails 1 through 5 bilateral. Follow up with her in 3 months.

## 2015-11-28 ENCOUNTER — Emergency Department (HOSPITAL_COMMUNITY)
Admission: EM | Admit: 2015-11-28 | Discharge: 2015-11-28 | Disposition: A | Discharge status: Home / Self Care | Payer: MEDICARE | Attending: Emergency Medicine | Admitting: Emergency Medicine

## 2015-11-28 ENCOUNTER — Emergency Department (HOSPITAL_COMMUNITY): Payer: MEDICARE

## 2015-11-28 ENCOUNTER — Encounter (HOSPITAL_COMMUNITY): Payer: Self-pay | Admitting: *Deleted

## 2015-11-28 DIAGNOSIS — M1990 Unspecified osteoarthritis, unspecified site: Secondary | ICD-10-CM | POA: Diagnosis not present

## 2015-11-28 DIAGNOSIS — Y929 Unspecified place or not applicable: Secondary | ICD-10-CM | POA: Insufficient documentation

## 2015-11-28 DIAGNOSIS — W19XXXA Unspecified fall, initial encounter: Secondary | ICD-10-CM

## 2015-11-28 DIAGNOSIS — Y999 Unspecified external cause status: Secondary | ICD-10-CM | POA: Diagnosis not present

## 2015-11-28 DIAGNOSIS — S0990XA Unspecified injury of head, initial encounter: Secondary | ICD-10-CM | POA: Diagnosis present

## 2015-11-28 DIAGNOSIS — Z853 Personal history of malignant neoplasm of breast: Secondary | ICD-10-CM | POA: Insufficient documentation

## 2015-11-28 DIAGNOSIS — W1839XA Other fall on same level, initial encounter: Secondary | ICD-10-CM | POA: Insufficient documentation

## 2015-11-28 DIAGNOSIS — S0003XA Contusion of scalp, initial encounter: Secondary | ICD-10-CM | POA: Diagnosis not present

## 2015-11-28 DIAGNOSIS — Y939 Activity, unspecified: Secondary | ICD-10-CM | POA: Diagnosis not present

## 2015-11-28 DIAGNOSIS — M542 Cervicalgia: Secondary | ICD-10-CM | POA: Diagnosis not present

## 2015-11-28 DIAGNOSIS — Z96652 Presence of left artificial knee joint: Secondary | ICD-10-CM | POA: Diagnosis not present

## 2015-11-28 DIAGNOSIS — R0781 Pleurodynia: Secondary | ICD-10-CM | POA: Diagnosis not present

## 2015-11-28 LAB — URINALYSIS, ROUTINE W REFLEX MICROSCOPIC
Bilirubin Urine: NEGATIVE
Glucose, UA: NEGATIVE mg/dL
HGB URINE DIPSTICK: NEGATIVE
Ketones, ur: NEGATIVE mg/dL
Leukocytes, UA: NEGATIVE
Nitrite: NEGATIVE
PROTEIN: NEGATIVE mg/dL
Specific Gravity, Urine: 1.008 (ref 1.005–1.030)
pH: 6 (ref 5.0–8.0)

## 2015-11-28 LAB — CBC WITH DIFFERENTIAL/PLATELET
BASOS ABS: 0 10*3/uL (ref 0.0–0.1)
Basophils Relative: 1 %
EOS PCT: 1 %
Eosinophils Absolute: 0 10*3/uL (ref 0.0–0.7)
HEMATOCRIT: 37.6 % (ref 36.0–46.0)
Hemoglobin: 13 g/dL (ref 12.0–15.0)
LYMPHS PCT: 20 %
Lymphs Abs: 1.3 10*3/uL (ref 0.7–4.0)
MCH: 30.9 pg (ref 26.0–34.0)
MCHC: 34.6 g/dL (ref 30.0–36.0)
MCV: 89.3 fL (ref 78.0–100.0)
Monocytes Absolute: 0.3 10*3/uL (ref 0.1–1.0)
Monocytes Relative: 5 %
NEUTROS ABS: 4.7 10*3/uL (ref 1.7–7.7)
Neutrophils Relative %: 73 %
PLATELETS: 191 10*3/uL (ref 150–400)
RBC: 4.21 MIL/uL (ref 3.87–5.11)
RDW: 13.5 % (ref 11.5–15.5)
WBC: 6.4 10*3/uL (ref 4.0–10.5)

## 2015-11-28 LAB — PROTIME-INR
INR: 0.96 (ref 0.00–1.49)
Prothrombin Time: 13 seconds (ref 11.6–15.2)

## 2015-11-28 LAB — BASIC METABOLIC PANEL
ANION GAP: 10 (ref 5–15)
BUN: 27 mg/dL — ABNORMAL HIGH (ref 6–20)
CO2: 26 mmol/L (ref 22–32)
Calcium: 9 mg/dL (ref 8.9–10.3)
Chloride: 107 mmol/L (ref 101–111)
Creatinine, Ser: 1.1 mg/dL — ABNORMAL HIGH (ref 0.44–1.00)
GFR calc Af Amer: 49 mL/min — ABNORMAL LOW (ref >60)
GFR, EST NON AFRICAN AMERICAN: 43 mL/min — AB (ref >60)
Glucose, Bld: 115 mg/dL — ABNORMAL HIGH (ref 65–99)
POTASSIUM: 3.7 mmol/L (ref 3.5–5.1)
SODIUM: 143 mmol/L (ref 135–145)

## 2015-11-28 LAB — APTT: aPTT: 28 seconds (ref 24–37)

## 2015-11-28 MED ORDER — CYCLOBENZAPRINE HCL 10 MG PO TABS
5.0000 mg | ORAL_TABLET | Freq: Once | ORAL | Status: AC
  Administered 2015-11-28: 5 mg via ORAL
  Filled 2015-11-28: qty 1

## 2015-11-28 MED ORDER — IBUPROFEN 200 MG PO TABS
400.0000 mg | ORAL_TABLET | Freq: Once | ORAL | Status: AC
  Administered 2015-11-28: 400 mg via ORAL
  Filled 2015-11-28: qty 2

## 2015-11-28 NOTE — ED Provider Notes (Signed)
CSN: [id number]     Arrival date & time 11/28/15  1449 History   First MD Initiated Contact with Patient 11/28/15 1533     Chief Complaint  Patient presents with  . Fall     (Consider location/radiation/quality/duration/timing/severity/associated sxs/prior Treatment) HPI   [name] is a 82 y/o female who presents to the [facility] ED for evaluation of closed head injury which occurred PTA secondary to mechanical fall.  She states her shoe "scuffed on the pavement" while she was turning around, causing her to loose her balance.  She fell backwards and struck the back of her head on hard flooring. EMS was called and she was evaluated on scene, she refused to be transported via EMS, but did come to the ER with her family in private vehicle.  She does not take blood thinners, denies LOC, neck pain, HA, blurry vision, weakness, numbness.  She has a "goose egg" located on the back of her head, which not causing pain.  There is no bleeding or laceration.  She complains of right mid back pain which she feels with twisting movements, pain rated 5/10.  She denies inspirational CP, SOB, abdominal pain.    Past Medical History  Diagnosis Date  . Allergic rhinitis, seasonal   . Osteoarthritis   . Menopausal syndrome   . HX: breast cancer     bilateral  . Osteoporosis   . Goiter     right thyroid nodule   . Squamous cell skin cancer     right lower leg  . Chronic venous insufficiency   . Dermatophytosis of nail   . Breast cancer of upper-outer quadrant of right female breast ([place]) 09/13/2014  . Family history of malignant neoplasm of breast   . Family history of malignant neoplasm of ovary   . Wears glasses   . Wears hearing aid     both ears  . Edema leg    Past Surgical History  Procedure Laterality Date  . Left eardum sx  1983  . Cataract extraction    . Tonsillectomy    . Dilation and curettage of uterus    . Umbilical hernia repair    . Bilateral lumpectomies for breast cancer  Aug.  2007  . Left knee arthroscopic surgery    . History of lumbar compression fracture    . Status post resection squamous cell cancer right lower leg    . No screening colonoscopy    . S/p bilateral lumpectomies    . Esophagogastroduodenoscopy  07/26/2011    Procedure: ESOPHAGOGASTRODUODENOSCOPY (EGD);  Surgeon: Jeryl Columbia, MD;  Location: [facility];  Service: Endoscopy;  Laterality: N/A;  . Savory dilation  07/26/2011    Procedure: SAVORY DILATION;  Surgeon: Jeryl Columbia, MD;  Location: [facility];  Service: Endoscopy;  Laterality: N/A;  . Breast lumpectomy with radioactive seed localization Right 10/01/2014    Procedure: BREAST LUMPECTOMY WITH RADIOACTIVE SEED LOCALIZATION;  Surgeon: Erroll Luna, MD;  Location: [facility];  Service: General;  Laterality: Right;   Family History  Problem Relation Age of Onset  . Cancer Mother 20    breast  . Heart attack Father   . Heart attack Brother   . Cancer Maternal Aunt 55    breast  . Coronary artery disease Other   . Cancer Other     ovarian; daughter of mat aunt w/ BC in 66s  . Heart attack Brother   . Heart disease Brother   . Cancer  Maternal Aunt 55    breast   Social History  Substance Use Topics  . Smoking status: Never Smoker   . Smokeless tobacco: Never Used  . Alcohol Use: No   OB History    No data available     Review of Systems  All other systems reviewed and are negative.     Allergies  Celecoxib  Home Medications   Prior to Admission medications   Medication Sig Start Date End Date Taking? Authorizing Provider  ketoconazole (NIZORAL) 2 % shampoo Apply 1 application topically as directed. 10/31/15  Yes Historical Provider, MD  Calcium Carb-Cholecalciferol (CALCIUM 600 + D PO) Take by mouth daily.    Historical Provider, MD  cycloSPORINE (RESTASIS) 0.05 % ophthalmic emulsion Place 1 drop into both eyes 2 (two) times daily. Reported on 08/07/2015    Historical Provider, MD  fexofenadine  (ALLEGRA) 180 MG tablet Take 1 tablet (180 mg total) by mouth daily. 02/09/13   Marletta Lor, MD  fluticasone [facility]) 50 MCG/ACT nasal spray Place 2 sprays into the nose daily. 02/09/13   Marletta Lor, MD  furosemide (LASIX) 40 MG tablet Take 1 tablet (40 mg total) by mouth daily. Patient taking differently: Take 40 mg by mouth daily. As needed 02/09/13   Marletta Lor, MD  Multiple Vitamins-Minerals (PRESERVISION AREDS 2) CAPS Take 1 capsule by mouth 2 (two) times daily.    Historical Provider, MD  nystatin-triamcinolone (MYCOLOG II) cream Apply 1 application topically 2 (two) times daily. 08/07/15   Marletta Lor, MD  pantoprazole (PROTONIX) 40 MG tablet Take 40 mg by mouth daily.  05/09/13   Historical Provider, MD  triamcinolone (KENALOG) 0.1 % cream Apply 1 application topically 2 (two) times daily. Reported on 07/18/2015    Historical Provider, MD   BP 173/56 mmHg  Pulse 75  Temp(Src) 98.4 F (36.9 C) (Oral)  Resp 15  Ht 5\' 3"  (1.6 m)  Wt 77.111 kg  BMI 30.12 kg/m2  SpO2 100% Physical Exam  Constitutional: She is oriented to person, place, and time. She appears well-developed and well-nourished. She is cooperative.  Non-toxic appearance. She does not have a sickly appearance. She does not appear ill. No distress.  HENT:  Head: Normocephalic. Head is with contusion. Head is without raccoon's eyes, without Battle's sign, without abrasion and without laceration.    Nose: Nose normal.  Mouth/Throat: Uvula is midline and oropharynx is clear and moist. Mucous membranes are not pale and dry. No trismus in the jaw. No oropharyngeal exudate.  Eyes: Conjunctivae and EOM are normal. Pupils are equal, round, and reactive to light. Right eye exhibits no discharge. Left eye exhibits no discharge. No scleral icterus.  Neck: Trachea normal, normal range of motion, full passive range of motion without pain and phonation normal. Neck supple. No JVD present. No spinous process  tenderness and no muscular tenderness present. No tracheal deviation, no edema, no erythema and normal range of motion present. No thyromegaly present.  Cardiovascular: Normal rate, regular rhythm, normal heart sounds and intact distal pulses.  Exam reveals no gallop and no friction rub.   No murmur heard. Pulmonary/Chest: Effort normal and breath sounds normal. No respiratory distress. She has no wheezes. She has no rhonchi. She has no rales. She exhibits no tenderness.  Abdominal: Soft. Bowel sounds are normal. She exhibits no distension and no mass. There is no tenderness. There is no rebound and no guarding.  Musculoskeletal: Normal range of motion. She exhibits no edema or  tenderness.  Lymphadenopathy:    She has no cervical adenopathy.  Neurological: She is alert and oriented to person, place, and time. She has normal reflexes. No cranial nerve deficit. She exhibits normal muscle tone. Coordination normal.  Skin: Skin is warm and dry. No rash noted. She is not diaphoretic. No erythema. No pallor.  Psychiatric: She has a normal mood and affect. Her behavior is normal. Judgment and thought content normal.  Nursing note and vitals reviewed.   ED Course  Procedures (including critical care time) Results for orders placed or performed during the hospital encounter of [id number]  Basic metabolic panel  Result Value Ref Range   Sodium 143 135 - 145 mmol/L   Potassium 3.7 3.5 - 5.1 mmol/L   Chloride 107 101 - 111 mmol/L   CO2 26 22 - 32 mmol/L   Glucose, Bld 115 (H) 65 - 99 mg/dL   BUN 27 (H) 6 - 20 mg/dL   Creatinine, Ser 1.10 (H) 0.44 - 1.00 mg/dL   Calcium 9.0 8.9 - 10.3 mg/dL   GFR calc non Af Amer 43 (L) >60 mL/min   GFR calc Af Amer 49 (L) >60 mL/min   Anion gap 10 5 - 15  CBC with Differential  Result Value Ref Range   WBC 6.4 4.0 - 10.5 K/uL   RBC 4.21 3.87 - 5.11 MIL/uL   Hemoglobin 13.0 12.0 - 15.0 g/dL   HCT 37.6 36.0 - 46.0 %   MCV 89.3 78.0 - 100.0 fL   MCH 30.9 26.0 -  34.0 pg   MCHC 34.6 30.0 - 36.0 g/dL   RDW 13.5 11.5 - 15.5 %   Platelets 191 150 - 400 K/uL   Neutrophils Relative % 73 %   Neutro Abs 4.7 1.7 - 7.7 K/uL   Lymphocytes Relative 20 %   Lymphs Abs 1.3 0.7 - 4.0 K/uL   Monocytes Relative 5 %   Monocytes Absolute 0.3 0.1 - 1.0 K/uL   Eosinophils Relative 1 %   Eosinophils Absolute 0.0 0.0 - 0.7 K/uL   Basophils Relative 1 %   Basophils Absolute 0.0 0.0 - 0.1 K/uL  Protime-INR  Result Value Ref Range   Prothrombin Time 13.0 11.6 - 15.2 seconds   INR 0.96 0.00 - 1.49  APTT  Result Value Ref Range   aPTT 28 24 - 37 seconds  Urinalysis, Routine w reflex microscopic (not at [facility])  Result Value Ref Range   Color, Urine YELLOW YELLOW   APPearance CLEAR CLEAR   Specific Gravity, Urine 1.008 1.005 - 1.030   pH 6.0 5.0 - 8.0   Glucose, UA NEGATIVE NEGATIVE mg/dL   Hgb urine dipstick NEGATIVE NEGATIVE   Bilirubin Urine NEGATIVE NEGATIVE   Ketones, ur NEGATIVE NEGATIVE mg/dL   Protein, ur NEGATIVE NEGATIVE mg/dL   Nitrite NEGATIVE NEGATIVE   Leukocytes, UA NEGATIVE NEGATIVE   Dg Chest 2 View  11/28/2015  CLINICAL DATA:  Fall and complains of right posterior rib pain. EXAM: CHEST  2 VIEW COMPARISON:  None. FINDINGS: Heart size is mildly enlarged. Lungs are clear without pulmonary edema or focal airspace disease. Negative for a pneumothorax. Trachea is midline. Clavicles appear to be intact. No large pleural effusions. Atherosclerotic calcifications involving the thoracic aorta. There is a compression fracture near the thoracolumbar junction which is age determine and could be chronic. IMPRESSION: No acute chest findings. Age-indeterminate vertebral body fracture near the thoracolumbar junction. Recommend clinical correlation in this area. Electronically Signed   By: Quita Skye  Anselm Pancoast M.D.   On: 11/28/2015 16:40   Ct Head Wo Contrast  11/28/2015  CLINICAL DATA:  [place] while turning, falling backwards and hitting back of head. No loss of consciousness.  No headache. EXAM: CT HEAD WITHOUT CONTRAST TECHNIQUE: Contiguous axial images were obtained from the base of the skull through the vertex without intravenous contrast. COMPARISON:  None. FINDINGS: Ventricles, cisterns and CSF spaces are within normal relative to patient's age. No evidence of mass, mass effect, shift of midline structures or acute hemorrhage. There is evidence of chronic ischemic microvascular disease. Possible tiny old lacune infarct over the right thalamus. Soft tissue swelling over the high left occipital scalp. No evidence of fracture. IMPRESSION: No acute intracranial findings. Small scalp contusion over the left occipital region. No underlying fracture. Mild chronic ischemic microvascular disease. Possible small old lacunar infarct over the right thalamus. Electronically Signed   By: Marin Olp M.D.   On: 11/28/2015 16:11   Ct Cervical Spine Wo Contrast  11/28/2015  CLINICAL DATA:  82 year old female with fall and neck pain EXAM: CT CERVICAL SPINE WITHOUT CONTRAST TECHNIQUE: Multidetector CT imaging of the cervical spine was performed without intravenous contrast. Multiplanar CT image reconstructions were also generated. COMPARISON:  None. FINDINGS: There is no acute fracture or subluxation of the cervical spine.There multilevel degenerative changes with facet hypertrophy most prominent involving C4-C6. There is grade 1 C3-C4 anterolisthesis and grade 1 80 per C6-C7 retrolisthesis.The odontoid and spinous processes are intact.There is normal anatomic alignment of the C1-C2 lateral masses. The visualized soft tissues appear unremarkable. An accessory azygos fissure is noted in the right apical region. There is focal area of thickening of the fissure which may represent small amount of loculated fluid. IMPRESSION: No acute/traumatic cervical spine pathology. Multilevel degenerative changes. Electronically Signed   By: Anner Crete M.D.   On: 11/28/2015 18:27    I have personally  reviewed and evaluated these images and lab results as part of my medical decision-making.   EKG Interpretation None      MDM   82 y/o female with mechanical fall and closed head injury. Basic labs, CT head and C-spine obtained. Negative for acute intracranial pathology or fx.  No laceration.  Labs unremarkable, no UTI. Pt is well appearing, feel she is safe to d/c home, encouraged conservative tx for soreness, muscle relaxer given.  Also encouraged f/up with PCP. Pt seen in a shared visit with attending EDP who has personally seen and evaluated pt and agrees with work-up, assessment, and plan.  D/C'd home in good condition. Filed Vitals:   11/28/15 1513 11/28/15 1806  BP: 173/56 160/70  Pulse: 75 70  Temp: 98.4 F (36.9 C)   Resp: 15 16     Final diagnoses:  Fall, initial encounter  Scalp hematoma, initial encounter        Delsa Grana, PA-C 12/10/15 2140  Lacretia Leigh, MD 12/12/15 [phone]

## 2015-11-28 NOTE — ED Notes (Signed)
Per pt report: pt was at [employer] when she was turning around and "went too fast" and fell. Pt reports falling backwards and hitting her head.  Pt has a mass on the back of her head.  Pt also reports back pain.  Pt denies LOC and no headache.  Pt a/o x 4.

## 2015-11-28 NOTE — Discharge Instructions (Signed)
Facial or Scalp Contusion A facial or scalp contusion is a deep bruise on the face or head. Injuries to the face and head generally cause a lot of swelling, especially around the eyes. Contusions are the result of an injury that caused bleeding under the skin. The contusion may turn blue, purple, or yellow. Minor injuries will give you a painless contusion, but more severe contusions may stay painful and swollen for a few weeks.  CAUSES  A facial or scalp contusion is caused by a blunt injury or trauma to the face or head area.  SIGNS AND SYMPTOMS   Swelling of the injured area.   Discoloration of the injured area.   Tenderness, soreness, or pain in the injured area.  DIAGNOSIS  The diagnosis can be made by taking a medical history and doing a physical exam. An X-ray exam, CT scan, or MRI may be needed to determine if there are any associated injuries, such as broken bones (fractures). TREATMENT  Often, the best treatment for a facial or scalp contusion is applying cold compresses to the injured area. Over-the-counter medicines may also be recommended for pain control.  HOME CARE INSTRUCTIONS   Only take over-the-counter or prescription medicines as directed by your health care provider.   Apply ice to the injured area.   Put ice in a plastic bag.   Place a towel between your skin and the bag.   Leave the ice on for 20 minutes, 2-3 times a day.  SEEK MEDICAL CARE IF:  You have bite problems.   You have pain with chewing.   You are concerned about facial defects. SEEK IMMEDIATE MEDICAL CARE IF:  You have severe pain or a headache that is not relieved by medicine.   You have unusual sleepiness, confusion, or personality changes.   You throw up (vomit).   You have a persistent nosebleed.   You have double vision or blurred vision.   You have fluid drainage from your nose or ear.   You have difficulty walking or using your arms or legs.  MAKE SURE YOU:    Understand these instructions.  Will watch your condition.  Will get help right away if you are not doing well or get worse.   This information is not intended to replace advice given to you by your health care provider. Make sure you discuss any questions you have with your health care provider.   Document Released: 08/12/2004 Document Revised: 07/26/2014 Document Reviewed: 02/15/2013 Elsevier Interactive Patient Education 2016 [place] Injury, Adult You have received a head injury. It does not appear serious at this time. Headaches and vomiting are common following head injury. It should be easy to awaken from sleeping. Sometimes it is necessary for you to stay in the emergency department for a while for observation. Sometimes admission to the hospital may be needed. After injuries such as yours, most problems occur within the first 24 hours, but side effects may occur up to 7-10 days after the injury. It is important for you to carefully monitor your condition and contact your health care provider or seek immediate medical care if there is a change in your condition. WHAT ARE THE TYPES OF HEAD INJURIES? Head injuries can be as minor as a bump. Some head injuries can be more severe. More severe head injuries include:  A jarring injury to the brain (concussion).  A bruise of the brain (contusion). This mean there is bleeding in the brain that can cause swelling.  A cracked skull (skull fracture).  Bleeding in the brain that collects, clots, and forms a bump (hematoma). WHAT CAUSES A HEAD INJURY? A serious head injury is most likely to happen to someone who is in a car wreck and is not wearing a seat belt. Other causes of major head injuries include bicycle or motorcycle accidents, sports injuries, and falls. HOW ARE HEAD INJURIES DIAGNOSED? A complete history of the event leading to the injury and your current symptoms will be helpful in diagnosing head injuries. Many times,  pictures of the brain, such as CT or MRI are needed to see the extent of the injury. Often, an overnight hospital stay is necessary for observation.  WHEN SHOULD I SEEK IMMEDIATE MEDICAL CARE?  You should get help right away if:  You have confusion or drowsiness.  You feel sick to your stomach (nauseous) or have continued, forceful vomiting.  You have dizziness or unsteadiness that is getting worse.  You have severe, continued headaches not relieved by medicine. Only take over-the-counter or prescription medicines for pain, fever, or discomfort as directed by your health care provider.  You do not have normal function of the arms or legs or are unable to walk.  You notice changes in the black spots in the center of the colored part of your eye (pupil).  You have a clear or bloody fluid coming from your nose or ears.  You have a loss of vision. During the next 24 hours after the injury, you must stay with someone who can watch you for the warning signs. This person should contact local emergency services (911 in the [place] if you have seizures, you become unconscious, or you are unable to wake up. HOW CAN I PREVENT A HEAD INJURY IN THE FUTURE? The most important factor for preventing major head injuries is avoiding motor vehicle accidents. To minimize the potential for damage to your head, it is crucial to wear seat belts while riding in motor vehicles. Wearing helmets while bike riding and playing collision sports (like football) is also helpful. Also, avoiding dangerous activities around the house will further help reduce your risk of head injury.  WHEN CAN I RETURN TO NORMAL ACTIVITIES AND ATHLETICS? You should be reevaluated by your health care provider before returning to these activities. If you have any of the following symptoms, you should not return to activities or contact sports until 1 week after the symptoms have stopped:  Persistent headache.  Dizziness or vertigo.  Poor  attention and concentration.  Confusion.  Memory problems.  Nausea or vomiting.  Fatigue or tire easily.  Irritability.  Intolerant of bright lights or loud noises.  Anxiety or depression.  Disturbed sleep. MAKE SURE YOU:   Understand these instructions.  Will watch your condition.  Will get help right away if you are not doing well or get worse.   This information is not intended to replace advice given to you by your health care provider. Make sure you discuss any questions you have with your health care provider.   Document Released: 07/05/2005 Document Revised: 07/26/2014 Document Reviewed: 03/12/2013 Elsevier Interactive Patient Education [employer].

## 2015-11-28 NOTE — ED Provider Notes (Signed)
Medical screening examination/treatment/procedure(s) were conducted as a shared visit with non-physician practitioner(s) and myself.  I personally evaluated the patient during the encounter.   EKG Interpretation None     Patient here after mechanical fall. X-rays are negative. Stable for discharge  Lacretia Leigh, MD 11/28/15 [phone]

## 2015-12-05 ENCOUNTER — Ambulatory Visit (INDEPENDENT_AMBULATORY_CARE_PROVIDER_SITE_OTHER): Payer: MEDICARE | Admitting: Internal Medicine

## 2015-12-05 ENCOUNTER — Encounter: Payer: Self-pay | Admitting: Internal Medicine

## 2015-12-05 VITALS — BP 138/58 | HR 83 | Temp 98.7°F | Ht 63.0 in | Wt 170.0 lb

## 2015-12-05 DIAGNOSIS — M159 Polyosteoarthritis, unspecified: Secondary | ICD-10-CM

## 2015-12-05 DIAGNOSIS — M150 Primary generalized (osteo)arthritis: Secondary | ICD-10-CM | POA: Diagnosis not present

## 2015-12-05 NOTE — Patient Instructions (Signed)
Call or return to clinic prn if these symptoms worsen or fail to improve as anticipated.  Limit your sodium (Salt) intake

## 2015-12-05 NOTE — Progress Notes (Signed)
Subjective:    Patient ID: [name], female    DOB: 1922-02-23, 82 y.o.   MRN: [id number]  HPI  82 year old patient who is seen following a recent emergency department visit 1 week ago.  The patient was shopping at [employer] when she met a quick turn, lost her balance and fell.  She is evaluated at the ED.  Imaging studies included head and cervical CT scanning as well as a chest x-ray.  No fracture identified.  The patient has been a bit sore but has steadily improved.  She states that today is her best day.  She is immature and without assistance.  ED records reviewed  Past Medical History  Diagnosis Date  . Allergic rhinitis, seasonal   . Osteoarthritis   . Menopausal syndrome   . HX: breast cancer     bilateral  . Osteoporosis   . Goiter     right thyroid nodule   . Squamous cell skin cancer     right lower leg  . Chronic venous insufficiency   . Dermatophytosis of nail   . Breast cancer of upper-outer quadrant of right female breast ([place]) 09/13/2014  . Family history of malignant neoplasm of breast   . Family history of malignant neoplasm of ovary   . Wears glasses   . Wears hearing aid     both ears  . Edema leg      Social History   Social History  . Marital Status: Married    Spouse Name: N/A  . Number of Children: N/A  . Years of Education: N/A   Occupational History  . Not on file.   Social History Main Topics  . Smoking status: Never Smoker   . Smokeless tobacco: Never Used  . Alcohol Use: No  . Drug Use: No  . Sexual Activity: Not on file   Other Topics Concern  . Not on file   Social History Narrative    Past Surgical History  Procedure Laterality Date  . Left eardum sx  1983  . Cataract extraction    . Tonsillectomy    . Dilation and curettage of uterus    . Umbilical hernia repair    . Bilateral lumpectomies for breast cancer  Aug. 2007  . Left knee arthroscopic surgery    . History of lumbar compression fracture    . Status post  resection squamous cell cancer right lower leg    . No screening colonoscopy    . S/p bilateral lumpectomies    . Esophagogastroduodenoscopy  07/26/2011    Procedure: ESOPHAGOGASTRODUODENOSCOPY (EGD);  Surgeon: Jeryl Columbia, MD;  Location: [facility];  Service: Endoscopy;  Laterality: N/A;  . Savory dilation  07/26/2011    Procedure: SAVORY DILATION;  Surgeon: Jeryl Columbia, MD;  Location: [facility];  Service: Endoscopy;  Laterality: N/A;  . Breast lumpectomy with radioactive seed localization Right 10/01/2014    Procedure: BREAST LUMPECTOMY WITH RADIOACTIVE SEED LOCALIZATION;  Surgeon: Erroll Luna, MD;  Location: [place];  Service: General;  Laterality: Right;    Family History  Problem Relation Age of Onset  . Cancer Mother 22    breast  . Heart attack Father   . Heart attack Brother   . Cancer Maternal Aunt 55    breast  . Coronary artery disease Other   . Cancer Other     ovarian; daughter of mat aunt w/ BC in 73s  . Heart attack Brother   . Heart  disease Brother   . Cancer Maternal Aunt 60    breast    Allergies  Allergen Reactions  . Celecoxib     REACTION: Swelling-leg    Current Outpatient Prescriptions on File Prior to Visit  Medication Sig Dispense Refill  . Calcium Carb-Cholecalciferol (CALCIUM 600 + D PO) Take 1 tablet by mouth daily.     . cycloSPORINE (RESTASIS) 0.05 % ophthalmic emulsion Place 1 drop into both eyes 2 (two) times daily as needed (dry eyes.). Reported on 08/07/2015    . fexofenadine (ALLEGRA) 180 MG tablet Take 1 tablet (180 mg total) by mouth daily. (Patient not taking: Reported on 11/28/2015) 60 tablet 3  . fluticasone (FLONASE) 50 MCG/ACT nasal spray Place 2 sprays into the nose daily. 16 g 6  . furosemide (LASIX) 40 MG tablet Take 1 tablet (40 mg total) by mouth daily. (Patient taking differently: Take 40 mg by mouth daily. As needed) 90 tablet 3  . ketoconazole (NIZORAL) 2 % shampoo Apply 1 application topically every 7  (seven) days.     . Multiple Vitamins-Minerals (PRESERVISION AREDS 2) CAPS Take 1 capsule by mouth 2 (two) times daily.    Marland Kitchen nystatin-triamcinolone (MYCOLOG II) cream Apply 1 application topically 2 (two) times daily. (Patient not taking: Reported on 11/28/2015) 30 g 2  . pantoprazole (PROTONIX) 40 MG tablet Take 40 mg by mouth daily.     Marland Kitchen triamcinolone (KENALOG) 0.1 % cream Apply 1 application topically daily as needed (dry skin). Reported on 07/18/2015     No current facility-administered medications on file prior to visit.    BP 138/58 mmHg  Pulse 83  Temp(Src) 98.7 F (37.1 C) (Oral)  Ht '5\' 3"'$  (1.6 m)  Wt 170 lb (77.111 kg)  BMI 30.12 kg/m2  SpO2 98%       Review of Systems  Constitutional: Negative.   HENT: Negative for congestion, dental problem, hearing loss, rhinorrhea, sinus pressure, sore throat and tinnitus.   Eyes: Negative for pain, discharge and visual disturbance.  Respiratory: Negative for cough and shortness of breath.   Cardiovascular: Negative for chest pain, palpitations and leg swelling.  Gastrointestinal: Negative for nausea, vomiting, abdominal pain, diarrhea, constipation, blood in stool and abdominal distention.  Genitourinary: Negative for dysuria, urgency, frequency, hematuria, flank pain, vaginal bleeding, vaginal discharge, difficulty urinating, vaginal pain and pelvic pain.  Musculoskeletal: Positive for arthralgias and gait problem. Negative for joint swelling.  Skin: Negative for rash.  Neurological: Negative for dizziness, syncope, speech difficulty, weakness, numbness and headaches.  Hematological: Negative for adenopathy.  Psychiatric/Behavioral: Negative for behavioral problems, dysphoric mood and agitation. The patient is not nervous/anxious.        Objective:   Physical Exam  Constitutional: She is oriented to person, place, and time. She appears well-developed and well-nourished.  HENT:  Head: Normocephalic.  Right Ear: External ear  normal.  Left Ear: External ear normal.  Mouth/Throat: Oropharynx is clear and moist.  Eyes: Conjunctivae and EOM are normal. Pupils are equal, round, and reactive to light.  Neck: Normal range of motion. Neck supple. No thyromegaly present.  Cardiovascular: Normal rate, regular rhythm, normal heart sounds and intact distal pulses.   Pulmonary/Chest: Effort normal and breath sounds normal.  Abdominal: Soft. Bowel sounds are normal. She exhibits no mass. There is no tenderness.  Musculoskeletal: Normal range of motion. She exhibits edema and tenderness.  Slight tenderness over the left patellar, distal insertion site  Lymphadenopathy:    She has no cervical adenopathy.  Neurological:  She is alert and oriented to person, place, and time.  Skin: Skin is warm and dry. No rash noted.  Resolving occipital hematoma  Psychiatric: She has a normal mood and affect. Her behavior is normal.          Assessment & Plan:   Status post fall with multiple contusions.  Improved.  No evidence of fracture Osteoarthritis History of osteoporosis Chronic venous insufficiency  No change in medical regimen

## 2016-01-08 DIAGNOSIS — H04123 Dry eye syndrome of bilateral lacrimal glands: Secondary | ICD-10-CM | POA: Diagnosis not present

## 2016-01-08 DIAGNOSIS — H524 Presbyopia: Secondary | ICD-10-CM | POA: Diagnosis not present

## 2016-01-08 DIAGNOSIS — Z961 Presence of intraocular lens: Secondary | ICD-10-CM | POA: Diagnosis not present

## 2016-01-08 DIAGNOSIS — H353132 Nonexudative age-related macular degeneration, bilateral, intermediate dry stage: Secondary | ICD-10-CM | POA: Diagnosis not present

## 2016-01-09 DIAGNOSIS — H353132 Nonexudative age-related macular degeneration, bilateral, intermediate dry stage: Secondary | ICD-10-CM | POA: Diagnosis not present

## 2016-01-09 DIAGNOSIS — H43813 Vitreous degeneration, bilateral: Secondary | ICD-10-CM | POA: Diagnosis not present

## 2016-01-09 DIAGNOSIS — H35373 Puckering of macula, bilateral: Secondary | ICD-10-CM | POA: Diagnosis not present

## 2016-02-12 ENCOUNTER — Encounter: Payer: Self-pay | Admitting: Podiatry

## 2016-02-12 ENCOUNTER — Ambulatory Visit (INDEPENDENT_AMBULATORY_CARE_PROVIDER_SITE_OTHER): Payer: MEDICARE | Admitting: Podiatry

## 2016-02-12 DIAGNOSIS — M79676 Pain in unspecified toe(s): Secondary | ICD-10-CM | POA: Diagnosis not present

## 2016-02-12 DIAGNOSIS — Q828 Other specified congenital malformations of skin: Secondary | ICD-10-CM | POA: Diagnosis not present

## 2016-02-12 DIAGNOSIS — B351 Tinea unguium: Secondary | ICD-10-CM | POA: Diagnosis not present

## 2016-02-12 NOTE — Progress Notes (Signed)
She presents today with chief complaint of painful elongated toenails and painful porokeratotic lesions on aspect of the bilateral foot.  Objective: Vital signs are stable alert and oriented 3. Pulses are palpable. Neurologic sensorium is intact per Semmes-Weinstein monofilament. Deep tendon reflexes are intact. She has multiple porokeratosis to the forefoot bilaterally with slight tender upon palpation and debridement. Her nails are thick yellow dystrophic onychomycotic subungual debris and painful on debridement.  Assessment: Pain in limb secondary to onychomycosis and porokeratosis.  Plan: Debridement of toenails and porokeratosis bilateral. Follow up with her in 3 months.

## 2016-03-23 DIAGNOSIS — Z853 Personal history of malignant neoplasm of breast: Secondary | ICD-10-CM | POA: Diagnosis not present

## 2016-03-23 LAB — HM MAMMOGRAPHY

## 2016-03-24 DIAGNOSIS — H353132 Nonexudative age-related macular degeneration, bilateral, intermediate dry stage: Secondary | ICD-10-CM | POA: Diagnosis not present

## 2016-03-25 ENCOUNTER — Encounter: Payer: Self-pay | Admitting: Internal Medicine

## 2016-04-09 DIAGNOSIS — R1310 Dysphagia, unspecified: Secondary | ICD-10-CM | POA: Diagnosis not present

## 2016-04-09 DIAGNOSIS — R194 Change in bowel habit: Secondary | ICD-10-CM | POA: Diagnosis not present

## 2016-04-15 DIAGNOSIS — H903 Sensorineural hearing loss, bilateral: Secondary | ICD-10-CM | POA: Diagnosis not present

## 2016-05-06 ENCOUNTER — Ambulatory Visit (INDEPENDENT_AMBULATORY_CARE_PROVIDER_SITE_OTHER): Payer: MEDICARE | Admitting: Podiatry

## 2016-05-06 ENCOUNTER — Encounter: Payer: Self-pay | Admitting: Podiatry

## 2016-05-06 DIAGNOSIS — B351 Tinea unguium: Secondary | ICD-10-CM | POA: Diagnosis not present

## 2016-05-06 DIAGNOSIS — Q828 Other specified congenital malformations of skin: Secondary | ICD-10-CM | POA: Diagnosis not present

## 2016-05-06 DIAGNOSIS — M79676 Pain in unspecified toe(s): Secondary | ICD-10-CM

## 2016-05-06 NOTE — Progress Notes (Signed)
She presents today chief complaint of painful thickened elongated toenails and painful corns and calluses plantar aspect of the bilateral foot.  Objective: Pulses remain palpable bilateral. Toenails are thick yellow dystrophic onychomycotic painful palpation. She also has thick porokeratotic lesions sub-second and third metatarsophalangeal joints bilateral.  Assessment: Pain in limb standard onychomycosis and porokeratosis bilateral.  Plan: Debridement of toenails 1 through 5 bilateral.

## 2016-05-24 ENCOUNTER — Telehealth: Payer: Self-pay | Admitting: Internal Medicine

## 2016-05-24 NOTE — Telephone Encounter (Signed)
Pt is out friend home [place] and had flu shot on 04/29/16

## 2016-05-24 NOTE — Telephone Encounter (Signed)
Chart was already updated

## 2016-07-02 ENCOUNTER — Ambulatory Visit (INDEPENDENT_AMBULATORY_CARE_PROVIDER_SITE_OTHER): Payer: MEDICARE | Admitting: Family Medicine

## 2016-07-02 ENCOUNTER — Encounter: Payer: Self-pay | Admitting: Family Medicine

## 2016-07-02 VITALS — BP 126/74 | HR 90 | Temp 97.7°F | Resp 12 | Ht 63.0 in | Wt 175.2 lb

## 2016-07-02 DIAGNOSIS — J309 Allergic rhinitis, unspecified: Secondary | ICD-10-CM | POA: Diagnosis not present

## 2016-07-02 MED ORDER — MOMETASONE FUROATE 50 MCG/ACT NA SUSP: 1.0000 | Freq: Every day | NASAL | 2 refills | Status: DC

## 2016-07-02 MED ORDER — AZELASTINE HCL 0.1 % NA SOLN: 2.0000 | Freq: Two times a day (BID) | NASAL | 4 refills | Status: DC

## 2016-07-02 NOTE — Progress Notes (Signed)
HPI:  ACUTE VISIT:  Chief Complaint  Patient presents with  . Sinus Problem    [name] is a 82 y.o. female, who is here today complaining of 2 days of nasal congestion, sinus pressure, sneezing, epiphora, and conjunctival erythema and pruritus. She has Hx of allergies, she is not taking Allegra 180 mg (on her med list). She is on Flonase but not longer helping.  No sick contact or recent travel. She denies fever, chills, visual changes, or severe/frequent headache. No sore throat , cough, dyspnea, or wheezing.   Reporting improvement of symptoms. Hx of dry eye , follows with ophthalmologist, she denies Hx of glaucoma.     Review of Systems  Constitutional: Negative for activity change, appetite change, fatigue and fever.  HENT: Positive for congestion, postnasal drip, rhinorrhea and sneezing. Negative for ear pain, mouth sores, nosebleeds, sore throat, trouble swallowing and voice change.   Eyes: Positive for discharge, redness and itching. Negative for photophobia and visual disturbance.  Respiratory: Negative for cough, shortness of breath and wheezing.   Gastrointestinal: Negative for abdominal pain, nausea and vomiting.  Skin: Negative for rash.  Allergic/Immunologic: Positive for environmental allergies.  Neurological: Negative for syncope, weakness and headaches.  Hematological: Negative for adenopathy.      Current Outpatient Prescriptions on File Prior to Visit  Medication Sig Dispense Refill  . Calcium Carb-Cholecalciferol (CALCIUM 600 + D PO) Take 1 tablet by mouth daily.     . cycloSPORINE (RESTASIS) 0.05 % ophthalmic emulsion Place 1 drop into both eyes 2 (two) times daily as needed (dry eyes.). Reported on 08/07/2015    . fexofenadine (ALLEGRA) 180 MG tablet Take 1 tablet (180 mg total) by mouth daily. 60 tablet 3  . furosemide (LASIX) 40 MG tablet Take 1 tablet (40 mg total) by mouth daily. (Patient taking differently: Take 40 mg by mouth  daily. As needed) 90 tablet 3  . ketoconazole (NIZORAL) 2 % shampoo Apply 1 application topically every 7 (seven) days.     . Multiple Vitamins-Minerals (PRESERVISION AREDS 2) CAPS Take 1 capsule by mouth 2 (two) times daily.    Marland Kitchen nystatin-triamcinolone (MYCOLOG II) cream Apply 1 application topically 2 (two) times daily. 30 g 2  . pantoprazole (PROTONIX) 40 MG tablet Take 40 mg by mouth daily.     Marland Kitchen triamcinolone (KENALOG) 0.1 % cream Apply 1 application topically daily as needed (dry skin). Reported on 07/18/2015     No current facility-administered medications on file prior to visit.      Past Medical History:  Diagnosis Date  . Allergic rhinitis, seasonal   . Breast cancer of upper-outer quadrant of right female breast ([place]) 09/13/2014  . Chronic venous insufficiency   . Dermatophytosis of nail   . Edema leg   . Family history of malignant neoplasm of breast   . Family history of malignant neoplasm of ovary   . Goiter    right thyroid nodule   . HX: breast cancer    bilateral  . Menopausal syndrome   . Osteoarthritis   . Osteoporosis   . Squamous cell skin cancer    right lower leg  . Wears glasses   . Wears hearing aid    both ears   Allergies  Allergen Reactions  . Celecoxib     REACTION: Swelling-leg    Social History   Social History  . Marital status: Married    Spouse name: N/A  . Number of children: N/A  .  Years of education: N/A   Social History Main Topics  . Smoking status: Never Smoker  . Smokeless tobacco: Never Used  . Alcohol use No  . Drug use: No  . Sexual activity: Not Asked   Other Topics Concern  . None   Social History Narrative  . None    Vitals:   07/02/16 1434  BP: 126/74  Pulse: 90  Resp: 12  Temp: 97.7 F (36.5 C)   Body mass index is 31.04 kg/m.      Physical Exam  Nursing note and vitals reviewed. Constitutional: She is oriented to person, place, and time. She appears well-developed. She does not appear ill.  No distress.  HENT:  Head: Atraumatic.  Left Ear: Tympanic membrane normal.  Nose: Rhinorrhea and septal deviation present. Right sinus exhibits no maxillary sinus tenderness and no frontal sinus tenderness. Left sinus exhibits no maxillary sinus tenderness and no frontal sinus tenderness.  Mouth/Throat: Oropharynx is clear and moist and mucous membranes are normal.  Eyes: EOM are normal. Right conjunctiva is injected. Left conjunctiva is injected.  Mild tarsal conjunctival erythema, left more than right. Mild epiphora bilateral. No photophobia.   Respiratory: Effort normal and breath sounds normal. No respiratory distress.  Lymphadenopathy:    She has no cervical adenopathy.  Neurological: She is alert and oriented to person, place, and time. She has normal strength. Coordination normal.  Skin: Skin is warm. No rash noted. No erythema.  Psychiatric: She has a normal mood and affect. Her speech is normal.  Well groomed, good eye contact.      ASSESSMENT AND PLAN:     [name] was seen today for sinus problem.  Diagnoses and all orders for this visit:  Allergic rhinitis, unspecified chronicity, unspecified seasonality, unspecified trigger -     mometasone (NASONEX) 50 MCG/ACT nasal spray; Place 1 spray into the nose daily. -     azelastine (ASTELIN) 0.1 % nasal spray; Place 2 sprays into both nostrils 2 (two) times daily. Use in each nostril as directed   Symptoms reported today and examination suggests allergic etiology. Stop Flonase nasal spray and try Nasonex. Also Astelin nasal spray recommended. OTC antihistaminic may aggravate dry eye, so hold on its use for now. Nasal irrigations at night. F/U with PCP as needed.       Return if symptoms worsen or fail to improve.     -[name] was advised to return or notify a doctor immediately if symptoms worsen or persist or new concerns arise.       Taylor G. Martinique, MD  [facility]. [place]  office.

## 2016-07-02 NOTE — Patient Instructions (Signed)
  [name] I have seen you today for an acute visit.  1. Allergic rhinitis, unspecified chronicity, unspecified seasonality, unspecified trigger   - mometasone (NASONEX) 50 MCG/ACT nasal spray; Place 1 spray into the nose daily.  Dispense: 17 g; Refill: 2 - azelastine (ASTELIN) 0.1 % nasal spray; Place 2 sprays into both nostrils 2 (two) times daily. Use in each nostril as directed  Dispense: 30 mL; Refill: 4  Nasal irrigations with saline.  In general please monitor for signs of worsening symptoms and seek immediate medical attention if any concerning/warning symptom as we discussed.   If symptoms are not resolved in 2-3 weeks you should schedule a follow up appointment with your doctor, before if needed.  Please be sure you have an appointment already scheduled with your PCP before you leave today.

## 2016-07-02 NOTE — Progress Notes (Signed)
Pre visit review using our clinic review tool, if applicable. No additional management support is needed unless otherwise documented below in the visit note. 

## 2016-07-06 ENCOUNTER — Telehealth: Payer: Self-pay | Admitting: Emergency Medicine

## 2016-07-06 NOTE — Telephone Encounter (Signed)
Pharmacy wants to know if patient can switch to Fluticasone or something similar since patients insurance will not cover Mometasone FUR 50MCG SPR?

## 2016-07-06 NOTE — Telephone Encounter (Signed)
Yes fluticasone.  Substitute okay

## 2016-07-07 ENCOUNTER — Other Ambulatory Visit: Payer: Self-pay | Admitting: Emergency Medicine

## 2016-07-07 MED ORDER — FLUTICASONE PROPIONATE 50 MCG/ACT NA SUSP: 2.0000 | Freq: Every day | NASAL | 6 refills | Status: DC

## 2016-07-08 DIAGNOSIS — H02104 Unspecified ectropion of left upper eyelid: Secondary | ICD-10-CM | POA: Diagnosis not present

## 2016-07-08 DIAGNOSIS — H524 Presbyopia: Secondary | ICD-10-CM | POA: Diagnosis not present

## 2016-07-08 DIAGNOSIS — H04123 Dry eye syndrome of bilateral lacrimal glands: Secondary | ICD-10-CM | POA: Diagnosis not present

## 2016-07-08 DIAGNOSIS — H353132 Nonexudative age-related macular degeneration, bilateral, intermediate dry stage: Secondary | ICD-10-CM | POA: Diagnosis not present

## 2016-07-08 DIAGNOSIS — H02105 Unspecified ectropion of left lower eyelid: Secondary | ICD-10-CM | POA: Diagnosis not present

## 2016-08-05 ENCOUNTER — Ambulatory Visit: Payer: MEDICARE | Admitting: Podiatry

## 2016-08-09 ENCOUNTER — Encounter: Payer: Self-pay | Admitting: Internal Medicine

## 2016-08-09 ENCOUNTER — Ambulatory Visit (INDEPENDENT_AMBULATORY_CARE_PROVIDER_SITE_OTHER): Payer: MEDICARE | Admitting: Internal Medicine

## 2016-08-09 VITALS — BP 178/68 | HR 80 | Temp 97.7°F | Ht 62.0 in | Wt 171.6 lb

## 2016-08-09 DIAGNOSIS — Z0000 Encounter for general adult medical examination without abnormal findings: Secondary | ICD-10-CM

## 2016-08-09 DIAGNOSIS — E042 Nontoxic multinodular goiter: Secondary | ICD-10-CM

## 2016-08-09 DIAGNOSIS — M810 Age-related osteoporosis without current pathological fracture: Secondary | ICD-10-CM | POA: Diagnosis not present

## 2016-08-09 DIAGNOSIS — M150 Primary generalized (osteo)arthritis: Secondary | ICD-10-CM | POA: Diagnosis not present

## 2016-08-09 DIAGNOSIS — I872 Venous insufficiency (chronic) (peripheral): Secondary | ICD-10-CM

## 2016-08-09 DIAGNOSIS — M159 Polyosteoarthritis, unspecified: Secondary | ICD-10-CM

## 2016-08-09 LAB — COMPREHENSIVE METABOLIC PANEL
ALK PHOS: 88 U/L (ref 39–117)
ALT: 9 U/L (ref 0–35)
AST: 13 U/L (ref 0–37)
Albumin: 3.6 g/dL (ref 3.5–5.2)
BUN: 18 mg/dL (ref 6–23)
CHLORIDE: 107 meq/L (ref 96–112)
CO2: 30 mEq/L (ref 19–32)
Calcium: 8.8 mg/dL (ref 8.4–10.5)
Creatinine, Ser: 0.98 mg/dL (ref 0.40–1.20)
GFR: 56.33 mL/min — AB (ref >60.00)
GLUCOSE: 105 mg/dL — AB (ref 70–99)
POTASSIUM: 4.5 meq/L (ref 3.5–5.1)
SODIUM: 143 meq/L (ref 135–145)
TOTAL PROTEIN: 6.2 g/dL (ref 6.0–8.3)
Total Bilirubin: 0.3 mg/dL (ref 0.2–1.2)

## 2016-08-09 LAB — CBC WITH DIFFERENTIAL/PLATELET
BASOS PCT: 0.6 % (ref 0.0–3.0)
Basophils Absolute: 0 10*3/uL (ref 0.0–0.1)
EOS PCT: 0.6 % (ref 0.0–5.0)
Eosinophils Absolute: 0 10*3/uL (ref 0.0–0.7)
HCT: 35 % — ABNORMAL LOW (ref 36.0–46.0)
HEMOGLOBIN: 11.9 g/dL — AB (ref 12.0–15.0)
LYMPHS ABS: 1 10*3/uL (ref 0.7–4.0)
Lymphocytes Relative: 19.1 % (ref 12.0–46.0)
MCHC: 34 g/dL (ref 30.0–36.0)
MCV: 89.8 fl (ref 78.0–100.0)
MONOS PCT: 5.4 % (ref 3.0–12.0)
Monocytes Absolute: 0.3 10*3/uL (ref 0.1–1.0)
Neutro Abs: 4.1 10*3/uL (ref 1.4–7.7)
Neutrophils Relative %: 74.3 % (ref 43.0–77.0)
Platelets: 263 10*3/uL (ref 150.0–400.0)
RBC: 3.89 Mil/uL (ref 3.87–5.11)
RDW: 13.6 % (ref 11.5–15.5)
WBC: 5.5 10*3/uL (ref 4.0–10.5)

## 2016-08-09 LAB — TSH: TSH: 2.7 u[IU]/mL (ref 0.35–4.50)

## 2016-08-09 NOTE — Patient Instructions (Addendum)

## 2016-08-09 NOTE — Progress Notes (Signed)
Pre visit review using our clinic review tool, if applicable. No additional management support is needed unless otherwise documented below in the visit note. 

## 2016-08-09 NOTE — Progress Notes (Signed)
Subjective:    Patient ID: [name], female    DOB: 10-03-1921, 83 y.o.   MRN: [id number]  HPI  83 year old patient who is seen today for a preventive health examination She does quite well.  Her only complaint is some fatigue. Medical issues include remote history of right breast cancer allergic rhinitis. No cardiopulmonary complaints  Past Medical History:  Diagnosis Date  . Allergic rhinitis, seasonal   . Breast cancer of upper-outer quadrant of right female breast ([place]) 09/13/2014  . Chronic venous insufficiency   . Dermatophytosis of nail   . Edema leg   . Family history of malignant neoplasm of breast   . Family history of malignant neoplasm of ovary   . Goiter    right thyroid nodule   . HX: breast cancer    bilateral  . Menopausal syndrome   . Osteoarthritis   . Osteoporosis   . Squamous cell skin cancer    right lower leg  . Wears glasses   . Wears hearing aid    both ears     Social History   Social History  . Marital status: Married    Spouse name: N/A  . Number of children: N/A  . Years of education: N/A   Occupational History  . Not on file.   Social History Main Topics  . Smoking status: Never Smoker  . Smokeless tobacco: Never Used  . Alcohol use No  . Drug use: No  . Sexual activity: Not on file   Other Topics Concern  . Not on file   Social History Narrative  . No narrative on file    Past Surgical History:  Procedure Laterality Date  . bilateral lumpectomies for breast cancer  Aug. 2007  . BREAST LUMPECTOMY WITH RADIOACTIVE SEED LOCALIZATION Right 10/01/2014   Procedure: BREAST LUMPECTOMY WITH RADIOACTIVE SEED LOCALIZATION;  Surgeon: Erroll Luna, MD;  Location: [facility];  Service: General;  Laterality: Right;  . CATARACT EXTRACTION    . DILATION AND CURETTAGE OF UTERUS    . ESOPHAGOGASTRODUODENOSCOPY  07/26/2011   Procedure: ESOPHAGOGASTRODUODENOSCOPY (EGD);  Surgeon: Jeryl Columbia, MD;  Location: [facility];  Service: Endoscopy;  Laterality: N/A;  . history of lumbar compression fracture    . left eardum sx  1983  . left knee arthroscopic surgery    . no screening colonoscopy    . s/p bilateral lumpectomies    . SAVORY DILATION  07/26/2011   Procedure: SAVORY DILATION;  Surgeon: Jeryl Columbia, MD;  Location: [facility];  Service: Endoscopy;  Laterality: N/A;  . status post resection squamous cell cancer right lower leg    . TONSILLECTOMY    . UMBILICAL HERNIA REPAIR      Family History  Problem Relation Age of Onset  . Cancer Mother 8    breast  . Heart attack Father   . Heart attack Brother   . Cancer Maternal Aunt 55    breast  . Coronary artery disease Other   . Cancer Other     ovarian; daughter of mat aunt w/ BC in 82s  . Heart attack Brother   . Heart disease Brother   . Cancer Maternal Aunt 60    breast    Allergies  Allergen Reactions  . Celecoxib     REACTION: Swelling-leg    Current Outpatient Prescriptions on File Prior to Visit  Medication Sig Dispense Refill  . azelastine (ASTELIN) 0.1 % nasal spray Place 2 sprays  into both nostrils 2 (two) times daily. Use in each nostril as directed 30 mL 4  . Calcium Carb-Cholecalciferol (CALCIUM 600 + D PO) Take 1 tablet by mouth daily.     . cycloSPORINE (RESTASIS) 0.05 % ophthalmic emulsion Place 1 drop into both eyes 2 (two) times daily as needed (dry eyes.). Reported on 08/07/2015    . fexofenadine (ALLEGRA) 180 MG tablet Take 1 tablet (180 mg total) by mouth daily. 60 tablet 3  . fluticasone (FLONASE) 50 MCG/ACT nasal spray Place 2 sprays into both nostrils daily. 16 g 6  . furosemide (LASIX) 40 MG tablet Take 1 tablet (40 mg total) by mouth daily. (Patient taking differently: Take 40 mg by mouth daily. As needed) 90 tablet 3  . ketoconazole (NIZORAL) 2 % shampoo Apply 1 application topically every 7 (seven) days.     . mometasone (NASONEX) 50 MCG/ACT nasal spray Place 1 spray into the nose daily. 17 g 2  .  Multiple Vitamins-Minerals (PRESERVISION AREDS 2) CAPS Take 1 capsule by mouth 2 (two) times daily.    Marland Kitchen nystatin-triamcinolone (MYCOLOG II) cream Apply 1 application topically 2 (two) times daily. 30 g 2  . pantoprazole (PROTONIX) 40 MG tablet Take 40 mg by mouth daily.     Marland Kitchen triamcinolone (KENALOG) 0.1 % cream Apply 1 application topically daily as needed (dry skin). Reported on 07/18/2015     No current facility-administered medications on file prior to visit.     BP (!) 178/68 (BP Location: Right Arm, Patient Position: Sitting, Cuff Size: Normal)   Pulse 80   Temp 97.7 F (36.5 C) (Oral)   Ht 5\' 2"  (1.575 m)   Wt 171 lb 9.6 oz (77.8 kg)   SpO2 98%   BMI 31.39 kg/m   Medicare wellness visit  1. Risk factors, based on past  M,S,F history .  No cardiovascular risk factors other than age  83.  Physical activities:fairly sedentary, does walk some but is quite unsteady.  Resident of an assisted living facility  3.  Depression/mood:no history of major depression but does describe herself as a Research officer, [employer]  4.  Hearing:moderate deficits.  Uses hearing aids bilaterally;  has had some tympanoplasty tubes present on the left  5.  ADL's:independent  6.  Fall risk:moderately high.  Has fallen over the past few months when she turns too quickly and lost her balance  7.  Home safety:no problems identified  8.  Height weight, and visual acuity;height and weight stable no change in visual acuity.  Is followed by ophthalmology due to macular degeneration  9.  Counseling:continue heart healthy diet.  Modest weight loss encouraged.  Recommended that she try to become more active  10. Lab orders based on risk factors:we'll review laboratory studies  11. Referral :follow-up ophthlogy  12. Care plan:continue efforts at aggressive risk factor modification  13. Cognitive assessment: alert and oriented with normal affect no cognitive dysfunction  14. Screening: Patient provided with a written and  personalized 5-10 year screening schedule in the AVS.    15. Provider List Update: primary care ophthalmology and podiatry    Review of Systems  Constitutional: Positive for fatigue.  HENT: Negative for congestion, dental problem, hearing loss, rhinorrhea, sinus pressure, sore throat and tinnitus.   Eyes: Negative for pain, discharge and visual disturbance.  Respiratory: Negative for cough and shortness of breath.   Cardiovascular: Negative for chest pain, palpitations and leg swelling.  Gastrointestinal: Negative for abdominal distention, abdominal pain, blood in stool, constipation,  diarrhea, nausea and vomiting.  Genitourinary: Negative for difficulty urinating, dysuria, flank pain, frequency, hematuria, pelvic pain, urgency, vaginal bleeding, vaginal discharge and vaginal pain.  Musculoskeletal: Negative for arthralgias, gait problem and joint swelling.  Skin: Negative for rash.  Neurological: Negative for dizziness, syncope, speech difficulty, weakness, numbness and headaches.  Hematological: Negative for adenopathy.  Psychiatric/Behavioral: Negative for agitation, behavioral problems and dysphoric mood. The patient is nervous/anxious.        Objective:   Physical Exam  Constitutional: She is oriented to person, place, and time. She appears well-developed and well-nourished.  HENT:  Head: Normocephalic and atraumatic.  Right Ear: External ear normal.  Left Ear: External ear normal.  Mouth/Throat: Oropharynx is clear and moist.  Bilateral hearing aids Scarring left tympanic membrane  Eyes: Conjunctivae and EOM are normal.  Arcus senilis  Neck: Normal range of motion. Neck supple. No JVD present. No thyromegaly present.  Cardiovascular: Normal rate, regular rhythm and intact distal pulses.   Murmur heard.  Grade 2-6 systolic murmur heard diffusely.  High-pitched Dorsalis pedis pulses full.  Posterior tibial pulses faint  Pulmonary/Chest: Effort normal and breath sounds  normal. She has no wheezes. She has no rales.  Abdominal: Soft. Bowel sounds are normal. She exhibits no distension and no mass. There is no tenderness. There is no rebound and no guarding.  Musculoskeletal: Normal range of motion. She exhibits no edema or tenderness.  Neurological: She is alert and oriented to person, place, and time. She has normal reflexes. No cranial nerve deficit. She exhibits normal muscle tone. Coordination normal.  Decreased vibratory sensation distally  Skin: Skin is warm and dry. No rash noted.  Psychiatric: She has a normal mood and affect. Her behavior is normal.          Assessment & Plan:   Subsequent medical wellness visit Allergic rhinitis Remote history right breast cancer Diminished auditory acuity Senile osteoporosis.  We'll continue calcium and vitamin D supplements.  Will attempt to increase activity level  Follow-up 6-12 months  [name]

## 2016-08-17 ENCOUNTER — Ambulatory Visit (INDEPENDENT_AMBULATORY_CARE_PROVIDER_SITE_OTHER): Payer: MEDICARE | Admitting: Podiatry

## 2016-08-17 ENCOUNTER — Encounter: Payer: Self-pay | Admitting: Podiatry

## 2016-08-17 DIAGNOSIS — B351 Tinea unguium: Secondary | ICD-10-CM | POA: Diagnosis not present

## 2016-08-17 DIAGNOSIS — Q828 Other specified congenital malformations of skin: Secondary | ICD-10-CM | POA: Diagnosis not present

## 2016-08-17 DIAGNOSIS — M79676 Pain in unspecified toe(s): Secondary | ICD-10-CM | POA: Diagnosis not present

## 2016-08-17 NOTE — Progress Notes (Signed)
She presents today with a chief complaint of painful elongated toenails multiple calluses plantar aspect of the bilateral foot limiting her ambulation.  Objective: Pulses are palpable bilateral. Toenails are thick yellow dystrophic onychomycotic she also has Salter porokeratotic lesions of the plantar aspect of the bilateral foot secondary to plantar flexed metatarsals. No open lesions or wounds are noted. No signs of infection.  Assessment: Pain elicited secondary to onychomycosis of porokeratosis bilateral.  Plan: Debridement of all reactive hyperkeratotic tissue as well as toenails 1 through 5 bilateral.

## 2016-09-10 ENCOUNTER — Telehealth: Payer: Self-pay | Admitting: Oncology

## 2016-09-10 NOTE — Telephone Encounter (Signed)
Confirmed appointment change with patient and she said "before I forget will you let the doctor know that I have cut back on my medicine, I only take a half of a pill and I'm doing better"

## 2016-09-23 ENCOUNTER — Ambulatory Visit: Payer: MEDICARE | Admitting: Oncology

## 2016-10-05 ENCOUNTER — Ambulatory Visit (HOSPITAL_BASED_OUTPATIENT_CLINIC_OR_DEPARTMENT_OTHER): Payer: MEDICARE | Admitting: Oncology

## 2016-10-05 VITALS — BP 142/47 | HR 77 | Temp 98.1°F | Resp 18 | Ht 62.0 in | Wt 169.1 lb

## 2016-10-05 DIAGNOSIS — C50411 Malignant neoplasm of upper-outer quadrant of right female breast: Secondary | ICD-10-CM

## 2016-10-05 DIAGNOSIS — N649 Disorder of breast, unspecified: Secondary | ICD-10-CM | POA: Diagnosis not present

## 2016-10-05 DIAGNOSIS — Z170 Estrogen receptor positive status [ER+]: Secondary | ICD-10-CM

## 2016-10-05 DIAGNOSIS — C50911 Malignant neoplasm of unspecified site of right female breast: Secondary | ICD-10-CM

## 2016-10-05 NOTE — Progress Notes (Signed)
[facility]  [phone] [phone]     ID: [name] DOB: 05-10-1922  MR#: [id number]  HDQ#:[id number]  Patient Care Team: Marletta Lor, MD as PCP - General Erroll Luna, MD as Consulting Physician (General Surgery) Chauncey Cruel, MD as Consulting Physician (Oncology) Arloa Koh, MD as Consulting Physician (Radiation Oncology) [name], RN as Registered Nurse [name], RN as Registered Nurse [name], NP as Nurse Practitioner (Nurse Practitioner) [name], MD as Consulting Physician (Gastroenterology) OTHER MD:  CHIEF COMPLAINT: ductal carcinoma in situ, right breast; remote invasive cancer left breast  CURRENT TREATMENT: observation   BREAST CANCER HISTORY: From the earlier summary note:  I saw [name] previously for a history of bilateral breast cancers, treated with bilateral lumpectomies and anti-estrogens for 5 years. She did not receive radiation treatments. She was last seen here in 2011.  She had her annual screening mammography at [facility] 08/27/2013, and this showed the breast density to be category B. There was an area of new grouped heterogeneous calcifications in the right breast at the 11:00 middle depth. A focal asymmetric density anterior to that area was seen in one view only. Additional views were recommended, but if they were performed I do not have those records. There appears to have been mammography on 03/05/2014, but again I do not have those records.  On 08/30/2014, bilateral diagnostic mammography with tomosynthesis this was obtained. The calcifications at the 11:00 position in the right breast were increased in number. There were no other significant findings. Biopsy of the area in question to 20 11/06/2014 showed ([id number]) high-grade ductal carcinoma in situ, with mucin extravasation.no definitive invasive component was seen however. This high-grade noninvasive tumor was estrogen  receptor positive at 100%, progesterone receptor positive at 44%, both with strong staining intensity.  Her subsequent history is as detailed below  INTERVAL HISTORY: [name] returns today for for follow-up of her bilateral breast cancers accompanied by her husband. She is doing "pretty good". [facility], her husband being president of the resident's group, and she being very active and that group as well. For exercise she "walks the holes.". Marland Kitchen REVIEW OF SYSTEMS: There is a little bump in her left breast which she tells me she found about 10 days ago and which concerns her. Aside from that a detailed review of systems today was entirely stable  PAST MEDICAL HISTORY: Past Medical History:  Diagnosis Date  . Allergic rhinitis, seasonal   . Breast cancer of upper-outer quadrant of right female breast ([place]) 09/13/2014  . Chronic venous insufficiency   . Dermatophytosis of nail   . Edema leg   . Family history of malignant neoplasm of breast   . Family history of malignant neoplasm of ovary   . Goiter    right thyroid nodule   . HX: breast cancer    bilateral  . Menopausal syndrome   . Osteoarthritis   . Osteoporosis   . Squamous cell skin cancer    right lower leg  . Wears glasses   . Wears hearing aid    both ears    PAST SURGICAL HISTORY: Past Surgical History:  Procedure Laterality Date  . bilateral lumpectomies for breast cancer  Aug. 2007  . BREAST LUMPECTOMY WITH RADIOACTIVE SEED LOCALIZATION Right 10/01/2014   Procedure: BREAST LUMPECTOMY WITH RADIOACTIVE SEED LOCALIZATION;  Surgeon: Erroll Luna, MD;  Location: [facility];  Service: General;  Laterality: Right;  .  CATARACT EXTRACTION    . DILATION AND CURETTAGE OF UTERUS    . ESOPHAGOGASTRODUODENOSCOPY  07/26/2011   Procedure: ESOPHAGOGASTRODUODENOSCOPY (EGD);  Surgeon: Petra Kuba, MD;  Location: [facility];  Service: Endoscopy;  Laterality: N/A;  . history of lumbar compression  fracture    . left eardum sx  1983  . left knee arthroscopic surgery    . no screening colonoscopy    . s/p bilateral lumpectomies    . SAVORY DILATION  07/26/2011   Procedure: SAVORY DILATION;  Surgeon: Petra Kuba, MD;  Location: [facility];  Service: Endoscopy;  Laterality: N/A;  . status post resection squamous cell cancer right lower leg    . TONSILLECTOMY    . UMBILICAL HERNIA REPAIR      FAMILY HISTORY Family History  Problem Relation Age of Onset  . Cancer Mother 5    breast  . Heart attack Father   . Heart attack Brother   . Heart attack Brother   . Cancer Maternal Aunt 55    breast  . Coronary artery disease Other   . Cancer Other     ovarian; daughter of mat aunt w/ BC in 44s  . Heart disease Brother   . Cancer Maternal Aunt 60    breast  the patient's father died at age 54 from a heart attack. The patient's mother died at age 49. The patient's mother was one of 8 sisters, 9 siblings. 2 of the sisters had breast cancer, postmenopausal. [name] self had 2 half-brothers and 1 full brother as well as one half-sister.There is no history of ovarian cancer in the familyexcept as noted.  GYNECOLOGIC HISTORY:  No LMP recorded. Patient is postmenopausal. Menarche age 17, the patient is GX P0. 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 Her husband [name] is a former Psychologist, educational. They currently reside at [facility] [place], with no pets.    ADVANCED DIRECTIVES: in place   HEALTH MAINTENANCE: Social History  Substance Use Topics  . Smoking status: Never Smoker  . Smokeless tobacco: Never Used  . Alcohol use No     Colonoscopy:  PAP:  Bone density:  Lipid panel:  Allergies  Allergen Reactions  . Celecoxib     REACTION: Swelling-leg    Current Outpatient Prescriptions  Medication Sig Dispense Refill  .  azelastine (ASTELIN) 0.1 % nasal spray Place 2 sprays into both nostrils 2 (two) times daily. Use in each nostril as directed 30 mL 4  . Calcium Carb-Cholecalciferol (CALCIUM 600 + D PO) Take 1 tablet by mouth daily.     . cycloSPORINE (RESTASIS) 0.05 % ophthalmic emulsion Place 1 drop into both eyes 2 (two) times daily as needed (dry eyes.). Reported on 08/07/2015    . fexofenadine (ALLEGRA) 180 MG tablet Take 1 tablet (180 mg total) by mouth daily. 60 tablet 3  . fluticasone (FLONASE) 50 MCG/ACT nasal spray Place 2 sprays into both nostrils daily. 16 g 6  . furosemide (LASIX) 40 MG tablet Take 1 tablet (40 mg total) by mouth daily. (Patient taking differently: Take 40 mg by mouth daily. As needed) 90 tablet 3  . ketoconazole (NIZORAL) 2 % shampoo Apply 1 application topically every 7 (seven) days.     . mometasone (NASONEX) 50 MCG/ACT nasal spray Place 1 spray into the nose daily. 17 g 2  . Multiple  Vitamins-Minerals (PRESERVISION AREDS 2) CAPS Take 1 capsule by mouth 2 (two) times daily.    Marland Kitchen nystatin-triamcinolone (MYCOLOG II) cream Apply 1 application topically 2 (two) times daily. 30 g 2  . pantoprazole (PROTONIX) 40 MG tablet Take 40 mg by mouth daily.     Marland Kitchen triamcinolone (KENALOG) 0.1 % cream Apply 1 application topically daily as needed (dry skin). Reported on 07/18/2015     No current facility-administered medications for this visit.     OBJECTIVE: elderly white woman who appears stated age  83:   10/05/16 1153  BP: (!) 142/47  Pulse: 77  Resp: 18  Temp: 98.1 F (36.7 C)     Body mass index is 30.93 kg/m.    ECOG FS:1 - Symptomatic but completely ambulatory  Sclerae unicteric, pupils round and equal Oropharynx clear and moist-- no thrush or other lesions No cervical or supraclavicular adenopathy Lungs no rales or rhonchi Heart regular rate and rhythm Abd soft, nontender, positive bowel sounds MSK no focal spinal tenderness, no upper extremity lymphedema Neuro:  nonfocal, well oriented, appropriate affect Breasts: She status post bilateral lumpectomies. In the left breast laterally and slightly inferiorly there is a 3 mm x 2 mm subcutaneous mass which is not erythematous, tender, or cystic. There are no other findings of concern. Both axillae are benign.  LAB RESULTS:  CMP     Component Value Date/Time   NA 143 08/09/2016 1010   NA 145 09/18/2014 1245   K 4.5 08/09/2016 1010   K 3.9 09/18/2014 1245   CL 107 08/09/2016 1010   CO2 30 08/09/2016 1010   CO2 25 09/18/2014 1245   GLUCOSE 105 (H) 08/09/2016 1010   GLUCOSE 107 09/18/2014 1245   BUN 18 08/09/2016 1010   BUN 20.1 09/18/2014 1245   CREATININE 0.98 08/09/2016 1010   CREATININE 1.2 (H) 09/18/2014 1245   CALCIUM 8.8 08/09/2016 1010   CALCIUM 9.1 09/18/2014 1245   PROT 6.2 08/09/2016 1010   PROT 6.8 09/18/2014 1245   ALBUMIN 3.6 08/09/2016 1010   ALBUMIN 3.8 09/18/2014 1245   AST 13 08/09/2016 1010   AST 14 09/18/2014 1245   ALT 9 08/09/2016 1010   ALT 8 09/18/2014 1245   ALKPHOS 88 08/09/2016 1010   ALKPHOS 100 09/18/2014 1245   BILITOT 0.3 08/09/2016 1010   BILITOT 0.49 09/18/2014 1245   GFRNONAA 43 (L) 11/28/2015 1543   GFRAA 49 (L) 11/28/2015 1543    INo results found for: SPEP, UPEP  Lab Results  Component Value Date   WBC 5.5 08/09/2016   NEUTROABS 4.1 08/09/2016   HGB 11.9 (L) 08/09/2016   HCT 35.0 (L) 08/09/2016   MCV 89.8 08/09/2016   PLT 263.0 08/09/2016      Chemistry      Component Value Date/Time   NA 143 08/09/2016 1010   NA 145 09/18/2014 1245   K 4.5 08/09/2016 1010   K 3.9 09/18/2014 1245   CL 107 08/09/2016 1010   CO2 30 08/09/2016 1010   CO2 25 09/18/2014 1245   BUN 18 08/09/2016 1010   BUN 20.1 09/18/2014 1245   CREATININE 0.98 08/09/2016 1010   CREATININE 1.2 (H) 09/18/2014 1245      Component Value Date/Time   CALCIUM 8.8 08/09/2016 1010   CALCIUM 9.1 09/18/2014 1245   ALKPHOS 88 08/09/2016 1010   ALKPHOS 100 09/18/2014 1245   AST  13 08/09/2016 1010   AST 14 09/18/2014 1245   ALT 9 08/09/2016 1010  ALT 8 09/18/2014 1245   BILITOT 0.3 08/09/2016 1010   BILITOT 0.49 09/18/2014 1245       Lab Results  Component Value Date   LABCA2 34 05/20/2011    No components found for: [id number]  No results for input(s): INR in the last 168 hours.  Urinalysis    Component Value Date/Time   COLORURINE YELLOW 11/28/2015 1708   APPEARANCEUR CLEAR 11/28/2015 1708   LABSPEC 1.008 11/28/2015 1708   PHURINE 6.0 11/28/2015 1708   GLUCOSEU NEGATIVE 11/28/2015 1708   HGBUR NEGATIVE 11/28/2015 1708   HGBUR trace-intact 10/28/2009 0905   BILIRUBINUR NEGATIVE 11/28/2015 1708   KETONESUR NEGATIVE 11/28/2015 1708   PROTEINUR NEGATIVE 11/28/2015 1708   UROBILINOGEN 0.2 10/28/2009 0905   NITRITE NEGATIVE 11/28/2015 1708   LEUKOCYTESUR NEGATIVE 11/28/2015 1708    STUDIES: Mammography 03/23/2016 showed the breast density to be category B. There was no evidence of disease recurrence. Calcifications in the right breast at 11:00 were stable  ASSESSMENT: 83 y.o. BRCA negative [place] resident   (1) status post bilateral lumpectomies without sentinel lymph node biopsy in August 2007 for a right-sided 7 mm, grade 2, invasive ductal carcinoma, and a left-sided 8 mm, grade 1, invasive ductal carcinoma.  Both tumors were ER positive, HER-2/neu negative, both with a low proliferation fraction.   (a) On Arimidex from September 2007 until May 2009, at which time we switched to tamoxifen primarily secondary to concerns regarding osteoporosis, completing five years of antiestrogen September 2011.   (b)  Also received Zometa yearly, discontinued after 4 doses in 2011  (2)  Status post right breast upper outer quadrant biopsy 09/11/2014 4 a 4.2 cm area of ductal carcinoma in situ, grade 2 or 3,  with extravasated mucin, estrogen and progesterone receptor positive   (3) status post right lumpectomy 10/01/2014 for a pT1a pNX, stage IA  invasive ductal carcinoma, grade 2, estrogen  and progesterone receptor positive, HER-2 negative, with an MIB-1 of 38%. Margins were close but negative   (4) she did not need adjuvant radiation   (5) opted against anti-estrogens  (a)  DEXA scan 10/31/2014 shows a T score of -1.8.  (6)  the BreastNext gene panel performed at [facility] 09/19/2014 showed no deleterious mutations in ATM, BARD1, BRCA1, BRCA2, BRIP1, CDH1, CHEK2, MRE11A, MUTYH, NBN, NF1, PALB2, PTEN, RAD50, RAD51C, RAD51D, or TP53.  PLAN: [name] is now 2 years out from her right breast lumpectomy with no evidence of disease recurrence. This is very favorable.  She is in excellent shape for her age and remains very active at [facility].  I think the small lesion in her left breast may prove to be benign, but it does need to be evaluated. I have alerted Dr. Marcelo Baldy, who will be doing her mammography later this week, for the likely need for a biopsy.  Assuming this proves negative, she will return to see me again in one year.  She knows to call for any other problems that may develop before then.  Chauncey Cruel, MD   10/05/2016 12:08 PM Medical Oncology and Hematology [facility] [address] [place], [place] [address] Tel. [phone]    Fax. [phone]

## 2016-10-07 DIAGNOSIS — M8589 Other specified disorders of bone density and structure, multiple sites: Secondary | ICD-10-CM | POA: Diagnosis not present

## 2016-10-07 DIAGNOSIS — N6002 Solitary cyst of left breast: Secondary | ICD-10-CM | POA: Diagnosis not present

## 2016-10-07 DIAGNOSIS — Z853 Personal history of malignant neoplasm of breast: Secondary | ICD-10-CM | POA: Diagnosis not present

## 2016-10-07 LAB — HM DEXA SCAN

## 2016-10-07 LAB — HM MAMMOGRAPHY

## 2016-10-08 ENCOUNTER — Encounter: Payer: Self-pay | Admitting: Family Medicine

## 2016-10-12 ENCOUNTER — Encounter: Payer: Self-pay | Admitting: Family Medicine

## 2016-11-16 ENCOUNTER — Ambulatory Visit (INDEPENDENT_AMBULATORY_CARE_PROVIDER_SITE_OTHER): Payer: MEDICARE | Admitting: Podiatry

## 2016-11-16 ENCOUNTER — Encounter: Payer: Self-pay | Admitting: Podiatry

## 2016-11-16 DIAGNOSIS — Q828 Other specified congenital malformations of skin: Secondary | ICD-10-CM

## 2016-11-16 DIAGNOSIS — M79676 Pain in unspecified toe(s): Secondary | ICD-10-CM | POA: Diagnosis not present

## 2016-11-16 DIAGNOSIS — B351 Tinea unguium: Secondary | ICD-10-CM | POA: Diagnosis not present

## 2016-11-16 NOTE — Progress Notes (Signed)
She presents today she complained of painful calluses and toenails.  Objective: Pulses remain palpable no open lesions or wounds. Complete atrophy of her forefoot fat pad resulting in poor keratomas some metatarsals bilaterally. Tenderness along the patellar dystrophic mycotic. No open lesions or wounds are noted.  Assessment: Pain limbs onychomycosis porokeratosis.  Plan: Debridement of toenails 1 through 5 bilateral debrided involving hyperkeratosis bilateral.

## 2016-12-28 ENCOUNTER — Telehealth: Payer: Self-pay

## 2016-12-28 NOTE — Telephone Encounter (Signed)
Called pt to review results of Dexa scan from March 2018.

## 2016-12-28 NOTE — Telephone Encounter (Signed)
Pt is having pain in her left leg below her knee when she walks. A little pain. She had a dx in the past of osteonecrosis in her left leg. Therefore she asking about the results of her dexa scan done in March.

## 2017-01-05 DIAGNOSIS — H35373 Puckering of macula, bilateral: Secondary | ICD-10-CM | POA: Diagnosis not present

## 2017-01-05 DIAGNOSIS — H43813 Vitreous degeneration, bilateral: Secondary | ICD-10-CM | POA: Diagnosis not present

## 2017-01-05 DIAGNOSIS — H353132 Nonexudative age-related macular degeneration, bilateral, intermediate dry stage: Secondary | ICD-10-CM | POA: Diagnosis not present

## 2017-01-06 DIAGNOSIS — H02104 Unspecified ectropion of left upper eyelid: Secondary | ICD-10-CM | POA: Diagnosis not present

## 2017-01-06 DIAGNOSIS — H04123 Dry eye syndrome of bilateral lacrimal glands: Secondary | ICD-10-CM | POA: Diagnosis not present

## 2017-01-06 DIAGNOSIS — H353132 Nonexudative age-related macular degeneration, bilateral, intermediate dry stage: Secondary | ICD-10-CM | POA: Diagnosis not present

## 2017-01-06 DIAGNOSIS — H02102 Unspecified ectropion of right lower eyelid: Secondary | ICD-10-CM | POA: Diagnosis not present

## 2017-01-06 DIAGNOSIS — H02101 Unspecified ectropion of right upper eyelid: Secondary | ICD-10-CM | POA: Diagnosis not present

## 2017-01-06 DIAGNOSIS — H02105 Unspecified ectropion of left lower eyelid: Secondary | ICD-10-CM | POA: Diagnosis not present

## 2017-02-16 ENCOUNTER — Ambulatory Visit: Payer: MEDICARE | Admitting: Podiatry

## 2017-02-17 ENCOUNTER — Encounter: Payer: Self-pay | Admitting: Podiatry

## 2017-02-17 ENCOUNTER — Ambulatory Visit (INDEPENDENT_AMBULATORY_CARE_PROVIDER_SITE_OTHER): Payer: Self-pay | Admitting: Podiatry

## 2017-02-17 DIAGNOSIS — M79676 Pain in unspecified toe(s): Secondary | ICD-10-CM

## 2017-02-17 DIAGNOSIS — Q828 Other specified congenital malformations of skin: Secondary | ICD-10-CM

## 2017-02-17 DIAGNOSIS — B351 Tinea unguium: Secondary | ICD-10-CM | POA: Diagnosis not present

## 2017-02-17 NOTE — Progress Notes (Signed)
She presents today to complaint of painful elongated toenails with corns and calluses.  Objective: Pulses remain palpable toenails are thick yellow dystrophic with mycotic multiple porokeratosis plantar aspect of the bilateral foot is noted. I see no signs of infection.  Assessment: Pain in limb secondary to onychomycosis and porokeratosis.  Plan: Debridement of all reactive hyperkeratotic tissue and toenails 1 through 5 bilateral service secondary to pain. Follow-up with me on an as-needed basis or 3.

## 2017-03-28 DIAGNOSIS — L821 Other seborrheic keratosis: Secondary | ICD-10-CM | POA: Diagnosis not present

## 2017-03-28 DIAGNOSIS — L300 Nummular dermatitis: Secondary | ICD-10-CM | POA: Diagnosis not present

## 2017-05-04 DIAGNOSIS — K449 Diaphragmatic hernia without obstruction or gangrene: Secondary | ICD-10-CM | POA: Diagnosis not present

## 2017-05-16 ENCOUNTER — Ambulatory Visit (INDEPENDENT_AMBULATORY_CARE_PROVIDER_SITE_OTHER): Payer: MEDICARE | Admitting: *Deleted

## 2017-05-16 DIAGNOSIS — Z23 Encounter for immunization: Secondary | ICD-10-CM | POA: Diagnosis not present

## 2017-05-24 ENCOUNTER — Ambulatory Visit (INDEPENDENT_AMBULATORY_CARE_PROVIDER_SITE_OTHER): Payer: MEDICARE | Admitting: Podiatry

## 2017-05-24 ENCOUNTER — Encounter: Payer: Self-pay | Admitting: Podiatry

## 2017-05-24 DIAGNOSIS — Q828 Other specified congenital malformations of skin: Secondary | ICD-10-CM

## 2017-05-24 DIAGNOSIS — M79676 Pain in unspecified toe(s): Secondary | ICD-10-CM

## 2017-05-24 DIAGNOSIS — B351 Tinea unguium: Secondary | ICD-10-CM

## 2017-05-24 NOTE — Progress Notes (Signed)
She presents today with chief complaint of painful elongated toenails with multiple porokeratosis plantar aspect bilateral foot.  Objective: Pulses remain palpable no open lesions or wounds to solitary porokeratotic lesions to the forefoot bilaterally was with general palpation. Toenails are long physiology dystrophic mycotic and painful palpation.  Assessment: Pain and limb secondary to onychomycosis and porokeratosis.  Plan: Debrided benign soft tissue lesion and return as 1 through 5 bilateral.

## 2017-08-11 DIAGNOSIS — H04123 Dry eye syndrome of bilateral lacrimal glands: Secondary | ICD-10-CM | POA: Diagnosis not present

## 2017-08-11 DIAGNOSIS — H353132 Nonexudative age-related macular degeneration, bilateral, intermediate dry stage: Secondary | ICD-10-CM | POA: Diagnosis not present

## 2017-08-11 DIAGNOSIS — H02105 Unspecified ectropion of left lower eyelid: Secondary | ICD-10-CM | POA: Diagnosis not present

## 2017-08-11 DIAGNOSIS — H02104 Unspecified ectropion of left upper eyelid: Secondary | ICD-10-CM | POA: Diagnosis not present

## 2017-08-11 DIAGNOSIS — H524 Presbyopia: Secondary | ICD-10-CM | POA: Diagnosis not present

## 2017-08-12 ENCOUNTER — Emergency Department (HOSPITAL_COMMUNITY): Payer: MEDICARE

## 2017-08-12 ENCOUNTER — Other Ambulatory Visit: Payer: Self-pay

## 2017-08-12 ENCOUNTER — Observation Stay (HOSPITAL_BASED_OUTPATIENT_CLINIC_OR_DEPARTMENT_OTHER): Payer: MEDICARE

## 2017-08-12 ENCOUNTER — Inpatient Hospital Stay (HOSPITAL_COMMUNITY)
Admission: EM | Admit: 2017-08-12 | Discharge: 2017-08-15 | DRG: 418 | Disposition: A | Discharge status: Home / Self Care | Payer: MEDICARE | Attending: General Surgery | Admitting: General Surgery

## 2017-08-12 ENCOUNTER — Encounter (HOSPITAL_COMMUNITY): Payer: Self-pay

## 2017-08-12 DIAGNOSIS — I10 Essential (primary) hypertension: Secondary | ICD-10-CM | POA: Diagnosis present

## 2017-08-12 DIAGNOSIS — R945 Abnormal results of liver function studies: Secondary | ICD-10-CM | POA: Diagnosis present

## 2017-08-12 DIAGNOSIS — Z85828 Personal history of other malignant neoplasm of skin: Secondary | ICD-10-CM

## 2017-08-12 DIAGNOSIS — K810 Acute cholecystitis: Secondary | ICD-10-CM | POA: Diagnosis not present

## 2017-08-12 DIAGNOSIS — R000 Tachycardia, unspecified: Secondary | ICD-10-CM | POA: Diagnosis present

## 2017-08-12 DIAGNOSIS — R932 Abnormal findings on diagnostic imaging of liver and biliary tract: Secondary | ICD-10-CM | POA: Diagnosis not present

## 2017-08-12 DIAGNOSIS — K219 Gastro-esophageal reflux disease without esophagitis: Secondary | ICD-10-CM | POA: Diagnosis not present

## 2017-08-12 DIAGNOSIS — M1990 Unspecified osteoarthritis, unspecified site: Secondary | ICD-10-CM | POA: Diagnosis present

## 2017-08-12 DIAGNOSIS — H9190 Unspecified hearing loss, unspecified ear: Secondary | ICD-10-CM | POA: Diagnosis present

## 2017-08-12 DIAGNOSIS — I872 Venous insufficiency (chronic) (peripheral): Secondary | ICD-10-CM | POA: Diagnosis present

## 2017-08-12 DIAGNOSIS — R7989 Other specified abnormal findings of blood chemistry: Secondary | ICD-10-CM

## 2017-08-12 DIAGNOSIS — Z9849 Cataract extraction status, unspecified eye: Secondary | ICD-10-CM

## 2017-08-12 DIAGNOSIS — R109 Unspecified abdominal pain: Secondary | ICD-10-CM | POA: Diagnosis not present

## 2017-08-12 DIAGNOSIS — M810 Age-related osteoporosis without current pathological fracture: Secondary | ICD-10-CM | POA: Diagnosis present

## 2017-08-12 DIAGNOSIS — I5189 Other ill-defined heart diseases: Secondary | ICD-10-CM | POA: Diagnosis present

## 2017-08-12 DIAGNOSIS — Z8041 Family history of malignant neoplasm of ovary: Secondary | ICD-10-CM

## 2017-08-12 DIAGNOSIS — N179 Acute kidney failure, unspecified: Secondary | ICD-10-CM | POA: Diagnosis not present

## 2017-08-12 DIAGNOSIS — K8012 Calculus of gallbladder with acute and chronic cholecystitis without obstruction: Principal | ICD-10-CM | POA: Diagnosis present

## 2017-08-12 DIAGNOSIS — J310 Chronic rhinitis: Secondary | ICD-10-CM | POA: Diagnosis not present

## 2017-08-12 DIAGNOSIS — D649 Anemia, unspecified: Secondary | ICD-10-CM | POA: Diagnosis not present

## 2017-08-12 DIAGNOSIS — K2970 Gastritis, unspecified, without bleeding: Secondary | ICD-10-CM | POA: Diagnosis not present

## 2017-08-12 DIAGNOSIS — I739 Peripheral vascular disease, unspecified: Secondary | ICD-10-CM | POA: Diagnosis present

## 2017-08-12 DIAGNOSIS — I519 Heart disease, unspecified: Secondary | ICD-10-CM | POA: Diagnosis present

## 2017-08-12 DIAGNOSIS — K8066 Calculus of gallbladder and bile duct with acute and chronic cholecystitis without obstruction: Secondary | ICD-10-CM | POA: Diagnosis not present

## 2017-08-12 DIAGNOSIS — Z974 Presence of external hearing-aid: Secondary | ICD-10-CM

## 2017-08-12 DIAGNOSIS — I361 Nonrheumatic tricuspid (valve) insufficiency: Secondary | ICD-10-CM

## 2017-08-12 DIAGNOSIS — K812 Acute cholecystitis with chronic cholecystitis: Secondary | ICD-10-CM | POA: Diagnosis present

## 2017-08-12 DIAGNOSIS — K8020 Calculus of gallbladder without cholecystitis without obstruction: Secondary | ICD-10-CM | POA: Diagnosis not present

## 2017-08-12 DIAGNOSIS — R112 Nausea with vomiting, unspecified: Secondary | ICD-10-CM | POA: Diagnosis not present

## 2017-08-12 DIAGNOSIS — Z79899 Other long term (current) drug therapy: Secondary | ICD-10-CM

## 2017-08-12 DIAGNOSIS — K819 Cholecystitis, unspecified: Secondary | ICD-10-CM

## 2017-08-12 DIAGNOSIS — D638 Anemia in other chronic diseases classified elsewhere: Secondary | ICD-10-CM | POA: Diagnosis present

## 2017-08-12 DIAGNOSIS — I119 Hypertensive heart disease without heart failure: Secondary | ICD-10-CM | POA: Diagnosis present

## 2017-08-12 DIAGNOSIS — Z888 Allergy status to other drugs, medicaments and biological substances status: Secondary | ICD-10-CM

## 2017-08-12 DIAGNOSIS — Z803 Family history of malignant neoplasm of breast: Secondary | ICD-10-CM

## 2017-08-12 DIAGNOSIS — Z923 Personal history of irradiation: Secondary | ICD-10-CM

## 2017-08-12 DIAGNOSIS — J302 Other seasonal allergic rhinitis: Secondary | ICD-10-CM | POA: Diagnosis present

## 2017-08-12 DIAGNOSIS — Z853 Personal history of malignant neoplasm of breast: Secondary | ICD-10-CM

## 2017-08-12 DIAGNOSIS — R1013 Epigastric pain: Secondary | ICD-10-CM | POA: Diagnosis not present

## 2017-08-12 HISTORY — DX: Essential (primary) hypertension: I10

## 2017-08-12 LAB — URINALYSIS, ROUTINE W REFLEX MICROSCOPIC
Bilirubin Urine: NEGATIVE
Glucose, UA: NEGATIVE mg/dL
Ketones, ur: NEGATIVE mg/dL
Nitrite: NEGATIVE
PROTEIN: NEGATIVE mg/dL
SPECIFIC GRAVITY, URINE: 1.014 (ref 1.005–1.030)
pH: 6 (ref 5.0–8.0)

## 2017-08-12 LAB — COMPREHENSIVE METABOLIC PANEL
ALBUMIN: 3.8 g/dL (ref 3.5–5.0)
ALK PHOS: 196 U/L — AB (ref 38–126)
ALT: 417 U/L — AB (ref 14–54)
AST: 862 U/L — AB (ref 15–41)
Anion gap: 10 (ref 5–15)
BUN: 27 mg/dL — AB (ref 6–20)
CALCIUM: 9.4 mg/dL (ref 8.9–10.3)
CO2: 24 mmol/L (ref 22–32)
CREATININE: 1.15 mg/dL — AB (ref 0.44–1.00)
Chloride: 105 mmol/L (ref 101–111)
GFR calc Af Amer: 46 mL/min — ABNORMAL LOW (ref >60)
GFR calc non Af Amer: 40 mL/min — ABNORMAL LOW (ref >60)
GLUCOSE: 158 mg/dL — AB (ref 65–99)
Potassium: 3.7 mmol/L (ref 3.5–5.1)
SODIUM: 139 mmol/L (ref 135–145)
Total Bilirubin: 1.5 mg/dL — ABNORMAL HIGH (ref 0.3–1.2)
Total Protein: 6.7 g/dL (ref 6.5–8.1)

## 2017-08-12 LAB — CBC WITH DIFFERENTIAL/PLATELET
BASOS ABS: 0 10*3/uL (ref 0.0–0.1)
Basophils Relative: 0 %
EOS ABS: 0 10*3/uL (ref 0.0–0.7)
Eosinophils Relative: 0 %
HCT: 36.5 % (ref 36.0–46.0)
HEMOGLOBIN: 12.1 g/dL (ref 12.0–15.0)
LYMPHS ABS: 0.2 10*3/uL — AB (ref 0.7–4.0)
LYMPHS PCT: 2 %
MCH: 29.9 pg (ref 26.0–34.0)
MCHC: 33.2 g/dL (ref 30.0–36.0)
MCV: 90.1 fL (ref 78.0–100.0)
Monocytes Absolute: 0.2 10*3/uL (ref 0.1–1.0)
Monocytes Relative: 2 %
NEUTROS PCT: 96 %
Neutro Abs: 9.2 10*3/uL — ABNORMAL HIGH (ref 1.7–7.7)
Platelets: 161 10*3/uL (ref 150–400)
RBC: 4.05 MIL/uL (ref 3.87–5.11)
RDW: 13.6 % (ref 11.5–15.5)
WBC: 9.5 10*3/uL (ref 4.0–10.5)

## 2017-08-12 LAB — ECHOCARDIOGRAM COMPLETE
Height: 63 in
Weight: 2720 oz

## 2017-08-12 LAB — I-STAT CG4 LACTIC ACID, ED
LACTIC ACID, VENOUS: 2.92 mmol/L — AB (ref 0.5–1.9)
LACTIC ACID, VENOUS: 1.27 mmol/L (ref 0.5–1.9)

## 2017-08-12 LAB — I-STAT TROPONIN, ED: Troponin i, poc: 0.06 ng/mL (ref 0.00–0.08)

## 2017-08-12 LAB — LIPASE, BLOOD: Lipase: 33 U/L (ref 11–51)

## 2017-08-12 MED ORDER — FENTANYL CITRATE (PF) 100 MCG/2ML IJ SOLN
12.5000 ug | INTRAMUSCULAR | Status: DC | PRN
  Administered 2017-08-13 (×2): 25 ug via INTRAVENOUS
  Administered 2017-08-13: 12.5 ug via INTRAVENOUS
  Administered 2017-08-13: 50 ug via INTRAVENOUS
  Administered 2017-08-13 (×2): 25 ug via INTRAVENOUS
  Filled 2017-08-12: qty 2

## 2017-08-12 MED ORDER — DIPHENHYDRAMINE HCL 12.5 MG/5ML PO ELIX: 12.5000 mg | ORAL_SOLUTION | Freq: Four times a day (QID) | ORAL | Status: DC | PRN

## 2017-08-12 MED ORDER — ONDANSETRON 4 MG PO TBDP: 4.0000 mg | ORAL_TABLET | Freq: Four times a day (QID) | ORAL | Status: DC | PRN

## 2017-08-12 MED ORDER — CEFTRIAXONE SODIUM 1 G IJ SOLR: 1.0000 g | Freq: Once | INTRAMUSCULAR | Status: DC

## 2017-08-12 MED ORDER — SODIUM CHLORIDE 0.9 % IV BOLUS (SEPSIS)
500.0000 mL | Freq: Once | INTRAVENOUS | Status: AC
  Administered 2017-08-12: 500 mL via INTRAVENOUS

## 2017-08-12 MED ORDER — METOPROLOL TARTRATE 5 MG/5ML IV SOLN: 5.0000 mg | Freq: Four times a day (QID) | INTRAVENOUS | Status: DC | PRN

## 2017-08-12 MED ORDER — DIPHENHYDRAMINE HCL 50 MG/ML IJ SOLN: 12.5000 mg | Freq: Four times a day (QID) | INTRAMUSCULAR | Status: DC | PRN

## 2017-08-12 MED ORDER — SIMETHICONE 80 MG PO CHEW
40.0000 mg | CHEWABLE_TABLET | Freq: Four times a day (QID) | ORAL | Status: DC | PRN
  Filled 2017-08-12: qty 1

## 2017-08-12 MED ORDER — ACETAMINOPHEN 325 MG PO TABS
650.0000 mg | ORAL_TABLET | Freq: Four times a day (QID) | ORAL | Status: DC | PRN
  Administered 2017-08-14: 650 mg via ORAL
  Filled 2017-08-12: qty 2

## 2017-08-12 MED ORDER — DEXTROSE 5 % IV SOLN
2.0000 g | INTRAVENOUS | Status: DC
  Administered 2017-08-13: 2 g via INTRAVENOUS
  Filled 2017-08-12: qty 2

## 2017-08-12 MED ORDER — HYDROCODONE-ACETAMINOPHEN 5-325 MG PO TABS: 1.0000 | ORAL_TABLET | ORAL | Status: DC | PRN

## 2017-08-12 MED ORDER — ACETAMINOPHEN 650 MG RE SUPP: 650.0000 mg | Freq: Four times a day (QID) | RECTAL | Status: DC | PRN

## 2017-08-12 MED ORDER — PANTOPRAZOLE SODIUM 40 MG IV SOLR
40.0000 mg | Freq: Every day | INTRAVENOUS | Status: DC
  Administered 2017-08-12 – 2017-08-14 (×3): 40 mg via INTRAVENOUS
  Filled 2017-08-12 (×3): qty 40

## 2017-08-12 MED ORDER — CEFTRIAXONE SODIUM 2 G IJ SOLR
2.0000 g | Freq: Once | INTRAMUSCULAR | Status: AC
  Administered 2017-08-12: 2 g via INTRAVENOUS
  Filled 2017-08-12: qty 2

## 2017-08-12 MED ORDER — ONDANSETRON HCL 4 MG/2ML IJ SOLN
4.0000 mg | Freq: Four times a day (QID) | INTRAMUSCULAR | Status: DC | PRN
  Administered 2017-08-13: 4 mg via INTRAVENOUS

## 2017-08-12 MED ORDER — SODIUM CHLORIDE 0.45 % IV SOLN
INTRAVENOUS | Status: DC
  Administered 2017-08-12 (×2): via INTRAVENOUS

## 2017-08-12 MED ORDER — FLUTICASONE PROPIONATE 50 MCG/ACT NA SUSP
2.0000 | Freq: Every day | NASAL | Status: DC
  Filled 2017-08-12: qty 16

## 2017-08-12 NOTE — Consult Note (Signed)
[facility]ology Consultation Note  Referring Provider: Dr. Excell Seltzer ([place]) Primary Care Physician:  Marletta Lor, MD Primary Gastroenterologist:  Dr. [name]  Reason for Consultation:  Abdominal pain, elevated LFTs  HPI: [name] is a 84 y.o. female presenting with two-day history intermittent abdominal pain, worse after eating large meals.  Some nausea and vomiting.  Some chills last night.  Pain improving now.  No jaundice, pruritus, blood in stools, dysphagia.  Ultrasound imaging worrisome for possible mild acute cholecystitis; gallstones noted; bile duct prominent but no definitive choledocholithiasis.   Past Medical History:  Diagnosis Date  . Allergic rhinitis, seasonal   . Breast cancer of upper-outer quadrant of right female breast ([place]) 09/13/2014  . Chronic venous insufficiency   . Dermatophytosis of nail   . Edema leg   . Family history of malignant neoplasm of breast   . Family history of malignant neoplasm of ovary   . Goiter    right thyroid nodule   . HX: breast cancer    bilateral  . Menopausal syndrome   . Osteoarthritis   . Osteoporosis   . Squamous cell skin cancer    right lower leg  . Wears glasses   . Wears hearing aid    both ears    Past Surgical History:  Procedure Laterality Date  . bilateral lumpectomies for breast cancer  Aug. 2007  . BREAST LUMPECTOMY WITH RADIOACTIVE SEED LOCALIZATION Right 10/01/2014   Procedure: BREAST LUMPECTOMY WITH RADIOACTIVE SEED LOCALIZATION;  Surgeon: Erroll Luna, MD;  Location: [place];  Service: General;  Laterality: Right;  . CATARACT EXTRACTION    . DILATION AND CURETTAGE OF UTERUS    . ESOPHAGOGASTRODUODENOSCOPY  07/26/2011   Procedure: ESOPHAGOGASTRODUODENOSCOPY (EGD);  Surgeon: Jeryl Columbia, MD;  Location: [facility];  Service: Endoscopy;  Laterality: N/A;  . history of lumbar compression fracture    . left eardum sx  1983  . left knee arthroscopic surgery     . no screening colonoscopy    . s/p bilateral lumpectomies    . SAVORY DILATION  07/26/2011   Procedure: SAVORY DILATION;  Surgeon: Jeryl Columbia, MD;  Location: [facility];  Service: Endoscopy;  Laterality: N/A;  . status post resection squamous cell cancer right lower leg    . TONSILLECTOMY    . UMBILICAL HERNIA REPAIR      Prior to Admission medications   Medication Sig Start Date End Date Taking? Authorizing Provider  Calcium Carb-Cholecalciferol (CALCIUM 600 + D PO) Take 1 tablet by mouth daily.    Yes [provider]  fluticasone (FLONASE) 50 MCG/ACT nasal spray Place 2 sprays into both nostrils daily. 07/07/16  Yes Marletta Lor, MD  furosemide (LASIX) 40 MG tablet Take 1 tablet (40 mg total) by mouth daily. Patient taking differently: Take 40 mg by mouth daily. As needed 02/09/13  Yes Marletta Lor, MD  ketoconazole (NIZORAL) 2 % shampoo Apply 1 application topically every 7 (seven) days.  10/31/15  Yes [provider]  Multiple Vitamins-Minerals (PRESERVISION AREDS 2) CAPS Take 1 capsule by mouth 2 (two) times daily.   Yes [provider]  pantoprazole (PROTONIX) 20 MG tablet Take 40 mg by mouth daily.  05/09/13  Yes [provider]  azelastine (ASTELIN) 0.1 % nasal spray Place 2 sprays into both nostrils 2 (two) times daily. Use in each nostril as directed Patient not taking: Reported on 08/12/2017 07/02/16   [place], Betty G, MD  cycloSPORINE (  RESTASIS) 0.05 % ophthalmic emulsion Place 1 drop into both eyes 2 (two) times daily as needed (dry eyes.). Reported on 08/07/2015    [provider]  fexofenadine (ALLEGRA) 180 MG tablet Take 1 tablet (180 mg total) by mouth daily. Patient not taking: Reported on 08/12/2017 02/09/13   Marletta Lor, MD  mometasone (NASONEX) 50 MCG/ACT nasal spray Place 1 spray into the nose daily. Patient not taking: Reported on 08/12/2017 07/02/16   [place], Betty G, MD  nystatin-triamcinolone  [facility] II) cream Apply 1 application topically 2 (two) times daily. Patient not taking: Reported on 08/12/2017 08/07/15   Marletta Lor, MD  triamcinolone (KENALOG) 0.1 % cream Apply 1 application topically daily as needed (dry skin). Reported on 07/18/2015    [provider]    Current Facility-Administered Medications  Medication Dose Route Frequency Provider Last Rate Last Dose  . 0.45 % sodium chloride infusion   Intravenous Continuous Meuth, Brooke A, PA-C 75 mL/hr at 08/12/17 1046    . acetaminophen (TYLENOL) tablet 650 mg  650 mg Oral Q6H PRN Meuth, Brooke A, PA-C       Or  . acetaminophen (TYLENOL) suppository 650 mg  650 mg Rectal Q6H PRN Meuth, Brooke A, PA-C      . [START ON 08/13/2017] cefTRIAXone (ROCEPHIN) 2 g in dextrose 5 % 50 mL IVPB  2 g Intravenous Q24H Meuth, Brooke A, PA-C      . diphenhydrAMINE (BENADRYL) 12.5 MG/5ML elixir 12.5 mg  12.5 mg Oral Q6H PRN Meuth, Brooke A, PA-C       Or  . diphenhydrAMINE (BENADRYL) injection 12.5 mg  12.5 mg Intravenous Q6H PRN Meuth, Brooke A, PA-C      . fentaNYL (SUBLIMAZE) injection 12.5 mcg  12.5 mcg Intravenous Q2H PRN Meuth, Brooke A, PA-C      . HYDROcodone-acetaminophen (NORCO/VICODIN) 5-325 MG per tablet 1-2 tablet  1-2 tablet Oral Q4H PRN Meuth, Brooke A, PA-C      . metoprolol tartrate (LOPRESSOR) injection 5 mg  5 mg Intravenous Q6H PRN Meuth, Brooke A, PA-C      . ondansetron (ZOFRAN-ODT) disintegrating tablet 4 mg  4 mg Oral Q6H PRN Meuth, Brooke A, PA-C       Or  . ondansetron (ZOFRAN) injection 4 mg  4 mg Intravenous Q6H PRN Meuth, Brooke A, PA-C      . pantoprazole (PROTONIX) injection 40 mg  40 mg Intravenous QHS Meuth, Brooke A, PA-C      . simethicone (MYLICON) chewable tablet 40 mg  40 mg Oral Q6H PRN Meuth, Brooke A, PA-C       Current Outpatient Medications  Medication Sig Dispense Refill  . Calcium Carb-Cholecalciferol (CALCIUM 600 + D PO) Take 1 tablet by mouth daily.     . fluticasone (FLONASE)  50 MCG/ACT nasal spray Place 2 sprays into both nostrils daily. 16 g 6  . furosemide (LASIX) 40 MG tablet Take 1 tablet (40 mg total) by mouth daily. (Patient taking differently: Take 40 mg by mouth daily. As needed) 90 tablet 3  . ketoconazole (NIZORAL) 2 % shampoo Apply 1 application topically every 7 (seven) days.     . Multiple Vitamins-Minerals (PRESERVISION AREDS 2) CAPS Take 1 capsule by mouth 2 (two) times daily.    . pantoprazole (PROTONIX) 20 MG tablet Take 40 mg by mouth daily.     Marland Kitchen azelastine (ASTELIN) 0.1 % nasal spray Place 2 sprays into both nostrils 2 (two) times daily. Use in each nostril  as directed (Patient not taking: Reported on 08/12/2017) 30 mL 4  . cycloSPORINE (RESTASIS) 0.05 % ophthalmic emulsion Place 1 drop into both eyes 2 (two) times daily as needed (dry eyes.). Reported on 08/07/2015    . fexofenadine (ALLEGRA) 180 MG tablet Take 1 tablet (180 mg total) by mouth daily. (Patient not taking: Reported on 08/12/2017) 60 tablet 3  . mometasone (NASONEX) 50 MCG/ACT nasal spray Place 1 spray into the nose daily. (Patient not taking: Reported on 08/12/2017) 17 g 2  . nystatin-triamcinolone (MYCOLOG II) cream Apply 1 application topically 2 (two) times daily. (Patient not taking: Reported on 08/12/2017) 30 g 2  . triamcinolone (KENALOG) 0.1 % cream Apply 1 application topically daily as needed (dry skin). Reported on 07/18/2015      Allergies as of 08/12/2017 - Review Complete 08/12/2017  Allergen Reaction Noted  . Celecoxib      Family History  Problem Relation Age of Onset  . Cancer Mother 45       breast  . Heart attack Father   . Heart attack Brother   . Heart attack Brother   . Cancer Maternal Aunt 55       breast  . Coronary artery disease Other   . Cancer Other        ovarian; daughter of mat aunt w/ BC in 95s  . Heart disease Brother   . Cancer Maternal Aunt 15       breast    Social History   Socioeconomic History  . Marital status: Married    Spouse  name: Not on file  . Number of children: Not on file  . Years of education: Not on file  . Highest education level: Not on file  Social Needs  . Financial resource strain: Not on file  . Food insecurity - worry: Not on file  . Food insecurity - inability: Not on file  . Transportation needs - medical: Not on file  . Transportation needs - non-medical: Not on file  Occupational History  . Not on file  Tobacco Use  . Smoking status: Never Smoker  . Smokeless tobacco: Never Used  Substance and Sexual Activity  . Alcohol use: No  . Drug use: No  . Sexual activity: Not on file  Other Topics Concern  . Not on file  Social History Narrative  . Not on file    Review of Systems: ROS Brooke Meuth (PA-C) for Dr. Excell Seltzer 08/12/17 reviewed and I agree  Physical Exam: Vital signs in last 24 hours: Temp:  [98.9 F (37.2 C)-100.2 F (37.9 C)] 98.9 F (37.2 C) (01/25 0726) Pulse Rate:  [92-107] 92 (01/25 1230) Resp:  [16-26] 20 (01/25 1230) BP: (131-143)/(46-73) 139/56 (01/25 1230) SpO2:  [89 %-98 %] 94 % (01/25 1230) Weight:  [170 lb (77.1 kg)] 170 lb (77.1 kg) (01/25 0601)   General:   Alert,  Well-developed, younger-appearing than stated age, well-nourished, pleasant and cooperative in NAD Head:  Normocephalic and atraumatic. Eyes:  Sclera clear, no icterus.   Conjunctiva pink. Ears:  Presbyacusis (hearing aids) Nose:  No deformity, discharge,  or lesions. Mouth:  No deformity or lesions.  Oropharynx pink & moist. Neck:  Supple; no masses or thyromegaly. Lungs:  Clear throughout to auscultation.   No wheezes, crackles, or rhonchi. No acute distress. Heart:  Regular rate and rhythm; no murmurs, clicks, rubs,  or gallops. Abdomen:  Soft, non distended, mild upper abdominal tenderness without peritonitis. No masses, hepatosplenomegaly or hernias noted. Normal  bowel sounds, without guarding, and without rebound.     Msk:  Symmetrical without gross deformities. Normal posture. Pulses:   Normal pulses noted. Extremities:  Without clubbing or edema. Neurologic:  Alert and  oriented x4;  grossly normal neurologically. Skin:  Intact without significant lesions or rashes. Psych:  Alert and cooperative. Normal mood and affect.   Lab Results: Recent Labs    08/12/17 0605  WBC 9.5  HGB 12.1  HCT 36.5  PLT 161   BMET Recent Labs    08/12/17 0605  NA 139  K 3.7  CL 105  CO2 24  GLUCOSE 158*  BUN 27*  CREATININE 1.15*  CALCIUM 9.4   LFT Recent Labs    08/12/17 0605  PROT 6.7  ALBUMIN 3.8  AST 862*  ALT 417*  ALKPHOS 196*  BILITOT 1.5*   PT/INR No results for input(s): LABPROT, INR in the last 72 hours.  Studies/Results: Dg Chest 2 View  Result Date: 08/12/2017 CLINICAL DATA:  History of breast carcinoma. Nausea and vomiting. Fever. EXAM: CHEST  2 VIEW COMPARISON:  Nov 28, 2015 FINDINGS: There is no edema or consolidation. Heart is mildly enlarged with pulmonary vascularity within normal limits. There is aortic atherosclerosis. No adenopathy. No blastic or lytic bone lesions. There is degenerative change in the thoracic spine. IMPRESSION: No edema or consolidation. Heart mildly enlarged. There is aortic atherosclerosis. Aortic Atherosclerosis (ICD10-I70.0). Electronically Signed   By: Lowella Grip III M.D.   On: 08/12/2017 08:40   US Abdomen Limited Ruq  Result Date: 08/12/2017 CLINICAL DATA:  Elevated liver enzymes with nausea and vomiting EXAM: ULTRASOUND ABDOMEN LIMITED RIGHT UPPER QUADRANT COMPARISON:  None. FINDINGS: Gallbladder: Within the gallbladder, there are echogenic foci which move and shadow consistent with cholelithiasis. Largest individual gallstone measures 9 mm in length. Gallbladder wall is mildly thickened with subtle edema. No pericholecystic fluid. No sonographic Murphy sign noted by sonographer. Common bile duct: Diameter: 8 mm, mildly prominent. No biliary duct mass or calculus evident. Liver: No focal lesion identified. Within  normal limits in parenchymal echogenicity. Portal vein is patent on color Doppler imaging with normal direction of blood flow towards the liver. IMPRESSION: Cholelithiasis with mild gallbladder wall thickening. Gallbladder wall appears subtly edematous. These are findings concerning for early acute cholecystitis. It may be prudent in this regard to consider nuclear medicine hepatobiliary imaging study to assess for cystic duct patency. Mild prominence of the common bile duct without mass or calculus evident. Electronically Signed   By: Lowella Grip III M.D.   On: 08/12/2017 08:13    Impression:  1.  Upper abdominal pain. 2.  Elevated liver enzymes. 3.  Nausea and vomiting. 4.  Gallstones with possible cholecystitis. 5.  Prominent bile duct on ultrasound. 6.  Prior troubles with dysphagia and esophageal stricture with dilatation few years ago; patient denies dysphagia at this time.  Plan:  1.  Antibiotics. 2.  IV fluids. 3.  Follow LFTs. 4.  If LFTs don't start downtrending, would consider MRCP; if LFTs are downtrending and patient is felt to be cholecystectomy candidate, would consider cholecystectomy with intraoperative cholangiogram. 5.  [facility] will follow.   LOS: 0 days   Alyssah Algeo M  08/12/2017, 12:53 PM  Cell [phone] If no answer or after 5 PM call [phone]

## 2017-08-12 NOTE — ED Notes (Signed)
PA at bedside.

## 2017-08-12 NOTE — Consult Note (Signed)
[facility] Medical Consultation  [name] SAY:[id number] DOB: 1921/11/15 DOA: 08/12/2017 PCP: Marletta Lor, MD   Requesting physician: Dr. Excell Seltzer Date of consultation: 08/12/17 Reason for consultation: preoperative optimization and assistance with medical management.  Impression/Recommendations 1-Acute cholecystitis: with concerns for potential CBD dilatation and gallstone retention. -Recommend IV fluids, n.p.o. status and IV antibiotics. -Primary service planning for cholecystectomy -GI has been consulted and if her LFT's improved with supportive care plan would be intraoperative cholangiogram; otherwise MRCP and potential ERCP.  2-GERD -continue PPI  3-HTN -PRN lopressor recommended   4-chronic venous insufficiency  -TED hose ordered  -no lasix currently  -once eating again, pursuit low sodium diet  -will check 2-echo (never done, and positive cardiomegaly seen on CXR)  5-medical clearance -patient with surprisingly low risk for surgical intervention  -2-D echo (given soft SEM heard on exam and for preoperative optimization) -no CP, no SOB -Gupta score 0.2  6-allergic rhinitis -continue flonase   [facility] will followup again tomorrow. Please contact us if we can be of assistance in the meanwhile. Thank you for this consultation.  Chief Complaint: abd pain, nausea and vomiting   HPI:  89 highly functional woman, with a past medical history significant for tinnitus, history of breast cancer, chronic venous insufficiency, hypertension (treated with diet management only) and gastroesophageal reflux disease; who presented to the ED secondary to a 2-day history of intermittent abdominal pain, nausea and vomiting.  The patient reports pain 8/10 at its worst, localized in her right upper quadrant and epigastric area, radiated to her back and right shoulder, with associated nausea and vomiting, worse with eating large meals and without significant factors for  improvement.  Patient also expressed chills but no documented objective fever.  She denies chest pain, shortness of breath, hematemesis, jaundice, pruritus, melena, hematochezia, difficulty swallowing or any other complaints. In the ED patient has a right upper quadrant ultrasound worrisome for possible mild acute cholecystitis and prominent bile duct (no definitive choledocholithiasis appreciated).  [facility] has been called to admit the patient for further management of cholecystitis and [facility] has been consulted for preoperative optimization and assistance with chronic medical management.  Review of Systems:  Negative except as otherwise mentioned in HPI.  Past Medical History:  Diagnosis Date  . Allergic rhinitis, seasonal   . Breast cancer of upper-outer quadrant of right female breast ([place]) 09/13/2014  . Chronic venous insufficiency   . Dermatophytosis of nail   . Edema leg   . Family history of malignant neoplasm of breast   . Family history of malignant neoplasm of ovary   . Goiter    right thyroid nodule   . HX: breast cancer    bilateral  . Menopausal syndrome   . Osteoarthritis   . Osteoporosis   . Squamous cell skin cancer    right lower leg  . Wears glasses   . Wears hearing aid    both ears   Past Surgical History:  Procedure Laterality Date  . bilateral lumpectomies for breast cancer  Aug. 2007  . BREAST LUMPECTOMY WITH RADIOACTIVE SEED LOCALIZATION Right 10/01/2014   Procedure: BREAST LUMPECTOMY WITH RADIOACTIVE SEED LOCALIZATION;  Surgeon: Erroll Luna, MD;  Location: [place];  Service: General;  Laterality: Right;  . CATARACT EXTRACTION    . DILATION AND CURETTAGE OF UTERUS    . ESOPHAGOGASTRODUODENOSCOPY  07/26/2011   Procedure: ESOPHAGOGASTRODUODENOSCOPY (EGD);  Surgeon: Jeryl Columbia, MD;  Location: [facility];  Service: Endoscopy;  Laterality: N/A;  .  history of lumbar compression fracture    . left eardum sx  1983  . left knee arthroscopic surgery     . no screening colonoscopy    . s/p bilateral lumpectomies    . SAVORY DILATION  07/26/2011   Procedure: SAVORY DILATION;  Surgeon: Jeryl Columbia, MD;  Location: [facility];  Service: Endoscopy;  Laterality: N/A;  . status post resection squamous cell cancer right lower leg    . TONSILLECTOMY    . UMBILICAL HERNIA REPAIR     Social History:  reports that  has never smoked. she has never used smokeless tobacco. She reports that she does not drink alcohol or use drugs.  Allergies  Allergen Reactions  . Celecoxib     REACTION: Swelling-leg   Family History  Problem Relation Age of Onset  . Cancer Mother 53       breast  . Heart attack Father   . Heart attack Brother   . Heart attack Brother   . Cancer Maternal Aunt 55       breast  . Coronary artery disease Other   . Cancer Other        ovarian; daughter of mat aunt w/ BC in 81s  . Heart disease Brother   . Cancer Maternal Aunt 60       breast    Prior to Admission medications   Medication Sig Start Date End Date Taking? Authorizing Provider  Calcium Carb-Cholecalciferol (CALCIUM 600 + D PO) Take 1 tablet by mouth daily.    Yes [provider]  fluticasone (FLONASE) 50 MCG/ACT nasal spray Place 2 sprays into both nostrils daily. 07/07/16  Yes Marletta Lor, MD  furosemide (LASIX) 40 MG tablet Take 1 tablet (40 mg total) by mouth daily. Patient taking differently: Take 40 mg by mouth daily. As needed 02/09/13  Yes Marletta Lor, MD  ketoconazole (NIZORAL) 2 % shampoo Apply 1 application topically every 7 (seven) days.  10/31/15  Yes [provider]  Multiple Vitamins-Minerals (PRESERVISION AREDS 2) CAPS Take 1 capsule by mouth 2 (two) times daily.   Yes [provider]  pantoprazole (PROTONIX) 20 MG tablet Take 40 mg by mouth daily.  05/09/13  Yes [provider]  azelastine (ASTELIN) 0.1 % nasal spray Place 2 sprays into both nostrils 2 (two) times daily. Use in each nostril as  directed Patient not taking: Reported on 08/12/2017 07/02/16   [place], Betty G, MD  cycloSPORINE (RESTASIS) 0.05 % ophthalmic emulsion Place 1 drop into both eyes 2 (two) times daily as needed (dry eyes.). Reported on 08/07/2015    [provider]  fexofenadine (ALLEGRA) 180 MG tablet Take 1 tablet (180 mg total) by mouth daily. Patient not taking: Reported on 08/12/2017 02/09/13   Marletta Lor, MD  mometasone (NASONEX) 50 MCG/ACT nasal spray Place 1 spray into the nose daily. Patient not taking: Reported on 08/12/2017 07/02/16   [place], Betty G, MD  nystatin-triamcinolone [facility] II) cream Apply 1 application topically 2 (two) times daily. Patient not taking: Reported on 08/12/2017 08/07/15   Marletta Lor, MD  triamcinolone (KENALOG) 0.1 % cream Apply 1 application topically daily as needed (dry skin). Reported on 07/18/2015    [provider]   Physical Exam: Blood pressure (!) 139/53, pulse 90, temperature 98.7 F (37.1 C), temperature source Oral, resp. rate 20, height 5\' 3"  (1.6 m), weight 77.1 kg (170 lb), SpO2 97 %. Vitals:   08/12/17 1230 08/12/17 1315  BP: Marland Kitchen)  139/56 (!) 139/53  Pulse: 92 90  Resp: 20 20  Temp:  98.7 F (37.1 C)  SpO2: 94% 97%     General: Afebrile, denies nausea or vomiting at this moment.  Patient reports mild right upper quadrant discomfort.  No chest pain, no palpitations, no shortness of breath.  Eyes: PERRLA, extraocular muscles intact, wearing corrective glasses, no icterus.  ENT: No ocular or nasal discharge, no erythema or exudates in her throat.  Neck: No thyromegaly, no JVD.  Cardiovascular: Mild tachycardia, no rubs, soft systolic ejection murmur, no gallops, trace to 1+ edema bilaterally (according to patient chronic swelling and currently at baseline).  Respiratory: Good air movement bilaterally, no wheezing, no crackles, no using accessory muscles.  Abdomen: Soft, positive bowel sounds, no guarding, right upper  quadrant discomfort with deep palpation.  Skin: Chronic erythema in her lower extremities (Stasis dermatitis), no open wounds, no petechiae, no indurations.  Musculoskeletal: Moving 4 limbs, good range of motion, normal muscle tone.  Neurologic:  Alert, awake and oriented x3, no focal neurologic deficit appreciated, with patient able to follow commands and answer questions appropriately.   Labs on Admission:  Basic Metabolic Panel: Recent Labs  Lab 08/12/17 0605  NA 139  K 3.7  CL 105  CO2 24  GLUCOSE 158*  BUN 27*  CREATININE 1.15*  CALCIUM 9.4   Liver Function Tests: Recent Labs  Lab 08/12/17 0605  AST 862*  ALT 417*  ALKPHOS 196*  BILITOT 1.5*  PROT 6.7  ALBUMIN 3.8   Recent Labs  Lab 08/12/17 0605  LIPASE 33   CBC: Recent Labs  Lab 08/12/17 0605  WBC 9.5  NEUTROABS 9.2*  HGB 12.1  HCT 36.5  MCV 90.1  PLT 161    Radiological Exams on Admission: Dg Chest 2 View  Result Date: 08/12/2017 CLINICAL DATA:  History of breast carcinoma. Nausea and vomiting. Fever. EXAM: CHEST  2 VIEW COMPARISON:  Nov 28, 2015 FINDINGS: There is no edema or consolidation. Heart is mildly enlarged with pulmonary vascularity within normal limits. There is aortic atherosclerosis. No adenopathy. No blastic or lytic bone lesions. There is degenerative change in the thoracic spine. IMPRESSION: No edema or consolidation. Heart mildly enlarged. There is aortic atherosclerosis. Aortic Atherosclerosis (ICD10-I70.0). Electronically Signed   By: Lowella Grip III M.D.   On: 08/12/2017 08:40   US Abdomen Limited Ruq  Result Date: 08/12/2017 CLINICAL DATA:  Elevated liver enzymes with nausea and vomiting EXAM: ULTRASOUND ABDOMEN LIMITED RIGHT UPPER QUADRANT COMPARISON:  None. FINDINGS: Gallbladder: Within the gallbladder, there are echogenic foci which move and shadow consistent with cholelithiasis. Largest individual gallstone measures 9 mm in length. Gallbladder wall is mildly thickened  with subtle edema. No pericholecystic fluid. No sonographic Murphy sign noted by sonographer. Common bile duct: Diameter: 8 mm, mildly prominent. No biliary duct mass or calculus evident. Liver: No focal lesion identified. Within normal limits in parenchymal echogenicity. Portal vein is patent on color Doppler imaging with normal direction of blood flow towards the liver. IMPRESSION: Cholelithiasis with mild gallbladder wall thickening. Gallbladder wall appears subtly edematous. These are findings concerning for early acute cholecystitis. It may be prudent in this regard to consider nuclear medicine hepatobiliary imaging study to assess for cystic duct patency. Mild prominence of the common bile duct without mass or calculus evident. Electronically Signed   By: Lowella Grip III M.D.   On: 08/12/2017 08:13    EKG:  No acute ischemia changes appreciated, normal axis, sinus tachycardia.  Time  spent: 50 minutes  Barton Dubois [facility] Pager [phone]  If 7PM-7AM, please contact night-coverage [email or web address] Password [facility] 08/12/2017, 5:24 PM

## 2017-08-12 NOTE — Progress Notes (Signed)
  Echocardiogram 2D Echocardiogram has been performed.  Annick Dimaio L Androw 08/12/2017, 3:53 PM

## 2017-08-12 NOTE — ED Provider Notes (Signed)
[facility] DEPT Provider Note   CSN: [id number] Arrival date & time: 08/12/17  0553     History   Chief Complaint Chief Complaint  Patient presents with  . Nausea and Vomiting    HPI [name] is a 84 y.o. female.  HPI   Ms. Hench is a 84yo female with a history of allergic rhinitis, chronic venous insufficiency, osteoarthritis who presents the emergency department for evaluation of fever, nausea/vomiting and epigastric abdominal pain.  Patient states that she had several episodes of vomiting 2 nights ago after eating barbecue, but yesterday states that she was able to tolerate food without trouble.  Reports that last night she experienced 6/10 severity epigastric discomfort which felt like indigestion before going to bed.  She took a Tums without subsequent relief.  In the middle of the night she felt chills and had several episodes of vomiting.  She lives independently at an assisted living facility, staff took her temperature this morning which was measured at 99.9 F. She also endorses increased urinary frequency. She denies chest pain, shortness of breath, cough, wheezing, diarrhea, melena, hematochezia, dysuria, flank pain, sore throat, congestion, headache, numbness, weakness, rash. Her husband recently had pneumonia.   Past Medical History:  Diagnosis Date  . Allergic rhinitis, seasonal   . Breast cancer of upper-outer quadrant of right female breast ([place]) 09/13/2014  . Chronic venous insufficiency   . Dermatophytosis of nail   . Edema leg   . Family history of malignant neoplasm of breast   . Family history of malignant neoplasm of ovary   . Goiter    right thyroid nodule   . HX: breast cancer    bilateral  . Menopausal syndrome   . Osteoarthritis   . Osteoporosis   . Squamous cell skin cancer    right lower leg  . Wears glasses   . Wears hearing aid    both ears    Patient Active Problem List   Diagnosis Date Noted  .  Genetic testing 10/10/2014  . Family history of malignant neoplasm of breast   . Family history of malignant neoplasm of ovary   . Breast cancer of upper-outer quadrant of right female breast ([place]) 09/13/2014  . History of esophageal stricture 07/25/2014  . Dermatophytosis of nail   . RHUS DERMATITIS 05/27/2010  . Osteoporosis 04/14/2010  . VAGINITIS, ATROPHIC 10/28/2009  . DYSURIA 10/28/2009  . GOITER, MULTINODULAR 03/05/2009  . THYROID NODULE, RIGHT 02/05/2009  . Venous (peripheral) insufficiency 02/05/2009  . SENILE OSTEOPOROSIS 01/24/2008  . URI 10/18/2007  . CUTANEOUS ERUPTIONS, DRUG-INDUCED 02/09/2007  . BREAST CANCER, HX OF 02/09/2007  . Allergic rhinitis 02/02/2007  . Osteoarthritis 02/02/2007    Past Surgical History:  Procedure Laterality Date  . bilateral lumpectomies for breast cancer  Aug. 2007  . BREAST LUMPECTOMY WITH RADIOACTIVE SEED LOCALIZATION Right 10/01/2014   Procedure: BREAST LUMPECTOMY WITH RADIOACTIVE SEED LOCALIZATION;  Surgeon: Erroll Luna, MD;  Location: [facility];  Service: General;  Laterality: Right;  . CATARACT EXTRACTION    . DILATION AND CURETTAGE OF UTERUS    . ESOPHAGOGASTRODUODENOSCOPY  07/26/2011   Procedure: ESOPHAGOGASTRODUODENOSCOPY (EGD);  Surgeon: Jeryl Columbia, MD;  Location: [facility];  Service: Endoscopy;  Laterality: N/A;  . history of lumbar compression fracture    . left eardum sx  1983  . left knee arthroscopic surgery    . no screening colonoscopy    . s/p bilateral lumpectomies    . SAVORY  DILATION  07/26/2011   Procedure: SAVORY DILATION;  Surgeon: Jeryl Columbia, MD;  Location: [facility];  Service: Endoscopy;  Laterality: N/A;  . status post resection squamous cell cancer right lower leg    . TONSILLECTOMY    . UMBILICAL HERNIA REPAIR      OB History    No data available       Home Medications    Prior to Admission medications   Medication Sig Start Date End Date Taking? Authorizing Provider    azelastine (ASTELIN) 0.1 % nasal spray Place 2 sprays into both nostrils 2 (two) times daily. Use in each nostril as directed 07/02/16   [place], Betty G, MD  Calcium Carb-Cholecalciferol (CALCIUM 600 + D PO) Take 1 tablet by mouth daily.     [provider]  cycloSPORINE (RESTASIS) 0.05 % ophthalmic emulsion Place 1 drop into both eyes 2 (two) times daily as needed (dry eyes.). Reported on 08/07/2015    [provider]  fexofenadine (ALLEGRA) 180 MG tablet Take 1 tablet (180 mg total) by mouth daily. 02/09/13   Marletta Lor, MD  fluticasone [facility]) 50 MCG/ACT nasal spray Place 2 sprays into both nostrils daily. 07/07/16   Marletta Lor, MD  furosemide (LASIX) 40 MG tablet Take 1 tablet (40 mg total) by mouth daily. Patient taking differently: Take 40 mg by mouth daily. As needed 02/09/13   Marletta Lor, MD  ketoconazole (NIZORAL) 2 % shampoo Apply 1 application topically every 7 (seven) days.  10/31/15   [provider]  mometasone (NASONEX) 50 MCG/ACT nasal spray Place 1 spray into the nose daily. 07/02/16   [place], Betty G, MD  Multiple Vitamins-Minerals (PRESERVISION AREDS 2) CAPS Take 1 capsule by mouth 2 (two) times daily.    [provider]  nystatin-triamcinolone (MYCOLOG II) cream Apply 1 application topically 2 (two) times daily. 08/07/15   Marletta Lor, MD  pantoprazole (PROTONIX) 40 MG tablet Take 40 mg by mouth daily.  05/09/13   [provider]  triamcinolone (KENALOG) 0.1 % cream Apply 1 application topically daily as needed (dry skin). Reported on 07/18/2015    [provider]    Family History Family History  Problem Relation Age of Onset  . Cancer Mother 72       breast  . Heart attack Father   . Heart attack Brother   . Heart attack Brother   . Cancer Maternal Aunt 55       breast  . Coronary artery disease Other   . Cancer Other        ovarian; daughter of mat aunt w/ BC in 56s  . Heart  disease Brother   . Cancer Maternal Aunt 49       breast    Social History Social History   Tobacco Use  . Smoking status: Never Smoker  . Smokeless tobacco: Never Used  Substance Use Topics  . Alcohol use: No  . Drug use: No     Allergies   Celecoxib   Review of Systems Review of Systems  Constitutional: Positive for chills and fever.  HENT: Negative for congestion, ear pain, rhinorrhea, sore throat and trouble swallowing.   Eyes: Negative for visual disturbance.  Respiratory: Negative for cough, shortness of breath and wheezing.   Cardiovascular: Positive for leg swelling (chronic, wears compression stockings). Negative for chest pain.  Gastrointestinal: Positive for abdominal pain, nausea and vomiting. Negative for abdominal distention, blood in stool and diarrhea.  Genitourinary:  Positive for frequency. Negative for difficulty urinating, dysuria, flank pain and hematuria.  Musculoskeletal: Negative for arthralgias.  Skin: Negative for rash.  Neurological: Negative for weakness, numbness and headaches.  Psychiatric/Behavioral: Negative for agitation.     Physical Exam Updated Vital Signs BP (!) 137/49 (BP Location: Right Arm)   Pulse 100   Temp 99 F (37.2 C) (Oral)   Resp 18   Ht 5\' 3"  (1.6 m)   Wt 77.1 kg (170 lb)   SpO2 92%   BMI 30.11 kg/m   Physical Exam  Constitutional: She is oriented to person, place, and time. She appears well-developed and well-nourished. No distress.  Patient lying at bedside in no apparent distress. Non-toxic appearing.   HENT:  Head: Normocephalic and atraumatic.  Nose: Nose normal.  Mouth/Throat: Oropharynx is clear and moist. No oropharyngeal exudate.  Mucous membranes moist.  Eyes: Conjunctivae are normal. Pupils are equal, round, and reactive to light. Right eye exhibits no discharge. Left eye exhibits no discharge.  Neck: Normal range of motion. Neck supple. No JVD present. No tracheal deviation present.    Cardiovascular: Intact distal pulses. Exam reveals no friction rub.  No murmur heard. Regular rate, tachycardic.  Pulmonary/Chest: Effort normal and breath sounds normal. No stridor. No respiratory distress. She has no wheezes. She has no rales.  Abdominal: Soft. Bowel sounds are normal.  Abdomen non-distended. Mildly tender to palpation in the epigastric area. No guarding or rigidity. Murphy's sign negative. No CVA tenderness.   Musculoskeletal: Normal range of motion.  Bilateral lower extremities with mild erythema. No weeping. No calf tenderness or pitting edema.   Lymphadenopathy:    She has no cervical adenopathy.  Neurological: She is alert and oriented to person, place, and time. Coordination normal.  Skin: Skin is warm and dry. Capillary refill takes less than 2 seconds. She is not diaphoretic.  Psychiatric: She has a normal mood and affect. Her behavior is normal.  Nursing note and vitals reviewed.    ED Treatments / Results  Labs (all labs ordered are listed, but only abnormal results are displayed) Labs Reviewed  CBC WITH DIFFERENTIAL/PLATELET - Abnormal; Notable for the following components:      Result Value   Neutro Abs 9.2 (*)    Lymphs Abs 0.2 (*)    All other components within normal limits  LIPASE, BLOOD  COMPREHENSIVE METABOLIC PANEL  URINALYSIS, ROUTINE W REFLEX MICROSCOPIC    EKG  EKG Interpretation  Date/Time:  Friday August 12 2017 06:27:31 EST Ventricular Rate:  105 PR Interval:    QRS Duration: 119 QT Interval:  349 QTC Calculation: 468 R Axis:   15 Text Interpretation:  Sinus tachycardia Probable left atrial enlargement LVH with secondary repolarization abnormality Confirmed by Sherwood Gambler [phone]) on 08/12/2017 6:29:46 AM       Radiology No results found.  Procedures Procedures (including critical care time)  Medications Ordered in ED Medications - No data to display   Initial Impression / Assessment and Plan / ED Course  I have  reviewed the triage vital signs and the nursing notes.  Pertinent labs & imaging results that were available during my care of the patient were reviewed by me and considered in my medical decision making (see chart for details).    Patient is a 84yo female who presents with N/V, epigastric pain and fever. On presentation she is nontoxic appearing. She is febrile (rectal temp 100.66F), tachycardic with pulse of 100bpm.   She denies chest pain and SOB, but  given she has epigastric pain and an elderly female will get cardiac work up. Will also get CXR to evaluate for potential pneumonia. Waiting on UA, CMP, Lipase, LA and CBC.  Labs reviewed. She has a lactic acidosis of 2.92. CMP reveals elevated creatinine (1.15 vs baseline 0.98), elevated liver enzymes (AST 862, ALT 417, AlkPhos 196, TBili 1.5). Lipase WNL. UA not infected. CXR without acute abnormality. Trop 0.6.  EKG with sinus tachycardia, non-ischemic.   Plan to get RUQ [place] to evaluate for cholecystitis. Patient started on IV fluids and IM Rocephin. Will hold off on tylenol given patient's elevated LFTs.   RUQ ultrasound with findings consistent with acute cholecystitis. General surgery consulted.  Brooke Meuth PA-C with general surgery saw this patient and will admit for cholecystitis. Patient and family at bedside informed.   Final Clinical Impressions(s) / ED Diagnoses   Final diagnoses:  LFT elevation    ED Discharge Orders    None       Bernarda Caffey 08/12/17 1210    Carmin Muskrat, MD 08/13/17 [phone]

## 2017-08-12 NOTE — ED Notes (Signed)
Patient states initial vomiting was in the evening after eating barbecue

## 2017-08-12 NOTE — ED Notes (Signed)
ED TO INPATIENT HANDOFF REPORT  Name/Age/Gender [name] 84 y.o. female  Code Status    Code Status Orders  (From admission, onward)        Start     Ordered   08/12/17 1035  Full code  Continuous     08/12/17 1039    Code Status History    Date Active Date Inactive Code Status Order ID Comments User Context   This patient has a current code status but no historical code status.      Home/SNF/Other Skilled nursing facility  Chief Complaint Nausea and Vomiting  Level of Care/Admitting Diagnosis ED Disposition    ED Disposition Condition Comment   Admit  Hospital Area: [facility] [[id number]]  Level of Care: Med-Surg [16]  Diagnosis: Acute cholecystitis [575.0.ICD-9-CM]  Admitting Physician: CCS, [place]  Attending Physician: CCS, [place]  Bed request comments: 5 west  PT Class (Do Not Modify): Observation [104]  PT Acc Code (Do Not Modify): Observation [10022]       Medical History Past Medical History:  Diagnosis Date  . Allergic rhinitis, seasonal   . Breast cancer of upper-outer quadrant of right female breast ([place]) 09/13/2014  . Chronic venous insufficiency   . Dermatophytosis of nail   . Edema leg   . Family history of malignant neoplasm of breast   . Family history of malignant neoplasm of ovary   . Goiter    right thyroid nodule   . HX: breast cancer    bilateral  . Menopausal syndrome   . Osteoarthritis   . Osteoporosis   . Squamous cell skin cancer    right lower leg  . Wears glasses   . Wears hearing aid    both ears    Allergies Allergies  Allergen Reactions  . Celecoxib     REACTION: Swelling-leg    IV Location/Drains/Wounds Patient Lines/Drains/Airways Status   Active Line/Drains/Airways    Name:   Placement date:   Placement time:   Site:   Days:   Peripheral IV 08/12/17 Left Forearm   08/12/17    -    Forearm   less than 1   Incision (Closed) 10/01/14 Breast Right   10/01/14    1535     1046          Labs/Imaging Results for orders placed or performed during the hospital encounter of 08/12/17 (from the past 48 hour(s))  Lipase, blood     Status: None   Collection Time: 08/12/17  6:05 AM  Result Value Ref Range   Lipase 33 11 - 51 U/L  Comprehensive metabolic panel     Status: Abnormal   Collection Time: 08/12/17  6:05 AM  Result Value Ref Range   Sodium 139 135 - 145 mmol/L   Potassium 3.7 3.5 - 5.1 mmol/L   Chloride 105 101 - 111 mmol/L   CO2 24 22 - 32 mmol/L   Glucose, Bld 158 (H) 65 - 99 mg/dL   BUN 27 (H) 6 - 20 mg/dL   Creatinine, Ser 1.15 (H) 0.44 - 1.00 mg/dL   Calcium 9.4 8.9 - 10.3 mg/dL   Total Protein 6.7 6.5 - 8.1 g/dL   Albumin 3.8 3.5 - 5.0 g/dL   AST 862 (H) 15 - 41 U/L   ALT 417 (H) 14 - 54 U/L   Alkaline Phosphatase 196 (H) 38 - 126 U/L   Total Bilirubin 1.5 (H) 0.3 - 1.2 mg/dL   GFR calc  non Af Amer 40 (L) >60 mL/min   GFR calc Af Amer 46 (L) >60 mL/min    Comment: (NOTE) The eGFR has been calculated using the CKD EPI equation. This calculation has not been validated in all clinical situations. eGFR's persistently <60 mL/min signify possible Chronic Kidney Disease.    Anion gap 10 5 - 15  CBC with Differential     Status: Abnormal   Collection Time: 08/12/17  6:05 AM  Result Value Ref Range   WBC 9.5 4.0 - 10.5 K/uL   RBC 4.05 3.87 - 5.11 MIL/uL   Hemoglobin 12.1 12.0 - 15.0 g/dL   HCT 36.5 36.0 - 46.0 %   MCV 90.1 78.0 - 100.0 fL   MCH 29.9 26.0 - 34.0 pg   MCHC 33.2 30.0 - 36.0 g/dL   RDW 13.6 11.5 - 15.5 %   Platelets 161 150 - 400 K/uL   Neutrophils Relative % 96 %   Neutro Abs 9.2 (H) 1.7 - 7.7 K/uL   Lymphocytes Relative 2 %   Lymphs Abs 0.2 (L) 0.7 - 4.0 K/uL   Monocytes Relative 2 %   Monocytes Absolute 0.2 0.1 - 1.0 K/uL   Eosinophils Relative 0 %   Eosinophils Absolute 0.0 0.0 - 0.7 K/uL   Basophils Relative 0 %   Basophils Absolute 0.0 0.0 - 0.1 K/uL  Urinalysis, Routine w reflex microscopic     Status: Abnormal    Collection Time: 08/12/17  6:27 AM  Result Value Ref Range   Color, Urine YELLOW YELLOW   APPearance CLEAR CLEAR   Specific Gravity, Urine 1.014 1.005 - 1.030   pH 6.0 5.0 - 8.0   Glucose, UA NEGATIVE NEGATIVE mg/dL   Hgb urine dipstick SMALL (A) NEGATIVE   Bilirubin Urine NEGATIVE NEGATIVE   Ketones, ur NEGATIVE NEGATIVE mg/dL   Protein, ur NEGATIVE NEGATIVE mg/dL   Nitrite NEGATIVE NEGATIVE   Leukocytes, UA TRACE (A) NEGATIVE   RBC / HPF 0-5 0 - 5 RBC/hpf   WBC, UA 0-5 0 - 5 WBC/hpf   Bacteria, UA RARE (A) NONE SEEN   Squamous Epithelial / LPF 0-5 (A) NONE SEEN   Mucus PRESENT   I-Stat Troponin, ED (not at [facility])     Status: None   Collection Time: 08/12/17  7:11 AM  Result Value Ref Range   Troponin i, poc 0.06 0.00 - 0.08 ng/mL   Comment 3            Comment: Due to the release kinetics of cTnI, a negative result within the first hours of the onset of symptoms does not rule out myocardial infarction with certainty. If myocardial infarction is still suspected, repeat the test at appropriate intervals.   I-Stat CG4 Lactic Acid, ED     Status: Abnormal   Collection Time: 08/12/17  7:12 AM  Result Value Ref Range   Lactic Acid, Venous 2.92 (HH) 0.5 - 1.9 mmol/L   Comment NOTIFIED PHYSICIAN   I-Stat CG4 Lactic Acid, ED     Status: None   Collection Time: 08/12/17  8:45 AM  Result Value Ref Range   Lactic Acid, Venous 1.27 0.5 - 1.9 mmol/L   Dg Chest 2 View  Result Date: 08/12/2017 CLINICAL DATA:  History of breast carcinoma. Nausea and vomiting. Fever. EXAM: CHEST  2 VIEW COMPARISON:  Nov 28, 2015 FINDINGS: There is no edema or consolidation. Heart is mildly enlarged with pulmonary vascularity within normal limits. There is aortic atherosclerosis. No adenopathy. No blastic  or lytic bone lesions. There is degenerative change in the thoracic spine. IMPRESSION: No edema or consolidation. Heart mildly enlarged. There is aortic atherosclerosis. Aortic Atherosclerosis (ICD10-I70.0).  Electronically Signed   By: Lowella Grip III M.D.   On: 08/12/2017 08:40   US Abdomen Limited Ruq  Result Date: 08/12/2017 CLINICAL DATA:  Elevated liver enzymes with nausea and vomiting EXAM: ULTRASOUND ABDOMEN LIMITED RIGHT UPPER QUADRANT COMPARISON:  None. FINDINGS: Gallbladder: Within the gallbladder, there are echogenic foci which move and shadow consistent with cholelithiasis. Largest individual gallstone measures 9 mm in length. Gallbladder wall is mildly thickened with subtle edema. No pericholecystic fluid. No sonographic Murphy sign noted by sonographer. Common bile duct: Diameter: 8 mm, mildly prominent. No biliary duct mass or calculus evident. Liver: No focal lesion identified. Within normal limits in parenchymal echogenicity. Portal vein is patent on color Doppler imaging with normal direction of blood flow towards the liver. IMPRESSION: Cholelithiasis with mild gallbladder wall thickening. Gallbladder wall appears subtly edematous. These are findings concerning for early acute cholecystitis. It may be prudent in this regard to consider nuclear medicine hepatobiliary imaging study to assess for cystic duct patency. Mild prominence of the common bile duct without mass or calculus evident. Electronically Signed   By: Lowella Grip III M.D.   On: 08/12/2017 08:13    Pending Labs Unresulted Labs (From admission, onward)   Start     Ordered   08/13/17 0500  Comprehensive metabolic panel  Tomorrow morning,   R     08/12/17 1039   08/13/17 0500  CBC  Tomorrow morning,   R     08/12/17 1039      Vitals/Pain Today's Vitals   08/12/17 1048 08/12/17 1100 08/12/17 1130 08/12/17 1230  BP:  140/68 140/73 (!) 139/56  Pulse:  (!) 104 99 92  Resp:  '19 20 20  '$ Temp:      TempSrc:      SpO2:  97% 98% 94%  Weight:      Height:      PainSc: 0-No pain       Isolation Precautions No active isolations  Medications Medications  0.45 % sodium chloride infusion ( Intravenous New  Bag/Given 08/12/17 1046)  metoprolol tartrate (LOPRESSOR) injection 5 mg (not administered)  pantoprazole (PROTONIX) injection 40 mg (not administered)  simethicone (MYLICON) chewable tablet 40 mg (not administered)  ondansetron (ZOFRAN-ODT) disintegrating tablet 4 mg (not administered)    Or  ondansetron (ZOFRAN) injection 4 mg (not administered)  diphenhydrAMINE (BENADRYL) 12.5 MG/5ML elixir 12.5 mg (not administered)    Or  diphenhydrAMINE (BENADRYL) injection 12.5 mg (not administered)  fentaNYL (SUBLIMAZE) injection 12.5 mcg (not administered)  acetaminophen (TYLENOL) tablet 650 mg (not administered)    Or  acetaminophen (TYLENOL) suppository 650 mg (not administered)  HYDROcodone-acetaminophen (NORCO/VICODIN) 5-325 MG per tablet 1-2 tablet (not administered)  cefTRIAXone (ROCEPHIN) 2 g in dextrose 5 % 50 mL IVPB (not administered)  sodium chloride 0.9 % bolus 500 mL (0 mLs Intravenous Stopped 08/12/17 0741)  cefTRIAXone (ROCEPHIN) 2 g in dextrose 5 % 50 mL IVPB (0 g Intravenous Stopped 08/12/17 0815)  sodium chloride 0.9 % bolus 500 mL (0 mLs Intravenous Stopped 08/12/17 0815)    Mobility walks with person assist

## 2017-08-12 NOTE — Consult Note (Signed)
[facility] Surgery Admission Note  [name] [birth year]  [id number].    Requesting MD: Carmin Muskrat Chief Complaint/Reason for Consult: epigastric abdominal pain  HPI:  [name] is a 84yo female who presented to [facility] earlier today with 2 days of worsening abdominal pain. Husband and 2 close friends at bedside. States that the pain is epigastric, now constant and severe. Worse with movement or PO intake. She reports associated nausea, vomiting, fever, chills. Denies dysuria, diarrhea, cough, CP, or SOB. States that she has never had pain like this before.  U/s shows cholelithiasis with mild gallbladder wall thickening, gallbladder wall appears subtly edematous, concerning for early acute cholecystitis. She has elevated LFTs (AST 862, ALT 417, alk phos 196, total bilirubin 1.5), lipase 33, WBC 9.5, TMAX 100.2.  Last meal was yesterday evening. Last BM yesterday.  -PMH significant for Chronic venous insufficiency, H/o breast cancer, GERD -Abdominal surgical history: none -Anticoagulants: none -Nonsmoker -Lives with husband in independent living  ROS: Review of Systems  Constitutional: Positive for fever.  HENT: Positive for hearing loss.   Eyes: Negative.   Respiratory: Negative.   Cardiovascular: Negative.   Gastrointestinal: Positive for abdominal pain, nausea and vomiting. Negative for blood in stool, constipation, diarrhea, heartburn and melena.  Genitourinary: Negative.   Musculoskeletal: Negative.   Skin: Negative.   Neurological: Positive for weakness.    All systems reviewed and otherwise negative except for as above  Family History  Problem Relation Age of Onset  . Cancer Mother 31       breast  . Heart attack Father   . Heart attack Brother   . Heart attack Brother   . Cancer Maternal Aunt 55       breast  . Coronary artery disease Other   . Cancer Other        ovarian; daughter of mat aunt w/ BC in 33s  . Heart disease Brother   . Cancer  Maternal Aunt 60       breast    Past Medical History:  Diagnosis Date  . Allergic rhinitis, seasonal   . Breast cancer of upper-outer quadrant of right female breast ([place]) 09/13/2014  . Chronic venous insufficiency   . Dermatophytosis of nail   . Edema leg   . Family history of malignant neoplasm of breast   . Family history of malignant neoplasm of ovary   . Goiter    right thyroid nodule   . HX: breast cancer    bilateral  . Menopausal syndrome   . Osteoarthritis   . Osteoporosis   . Squamous cell skin cancer    right lower leg  . Wears glasses   . Wears hearing aid    both ears    Past Surgical History:  Procedure Laterality Date  . bilateral lumpectomies for breast cancer  Aug. 2007  . BREAST LUMPECTOMY WITH RADIOACTIVE SEED LOCALIZATION Right 10/01/2014   Procedure: BREAST LUMPECTOMY WITH RADIOACTIVE SEED LOCALIZATION;  Surgeon: Erroll Luna, MD;  Location: [address];  Service: General;  Laterality: Right;  . CATARACT EXTRACTION    . DILATION AND CURETTAGE OF UTERUS    . ESOPHAGOGASTRODUODENOSCOPY  07/26/2011   Procedure: ESOPHAGOGASTRODUODENOSCOPY (EGD);  Surgeon: Jeryl Columbia, MD;  Location: [facility];  Service: Endoscopy;  Laterality: N/A;  . history of lumbar compression fracture    . left eardum sx  1983  . left knee arthroscopic surgery    . no screening colonoscopy    . s/p  bilateral lumpectomies    . SAVORY DILATION  07/26/2011   Procedure: SAVORY DILATION;  Surgeon: Jeryl Columbia, MD;  Location: [facility];  Service: Endoscopy;  Laterality: N/A;  . status post resection squamous cell cancer right lower leg    . TONSILLECTOMY    . UMBILICAL HERNIA REPAIR      Social History:  reports that  has never smoked. she has never used smokeless tobacco. She reports that she does not drink alcohol or use drugs.  Allergies:  Allergies  Allergen Reactions  . Celecoxib     REACTION: Swelling-leg     (Not in a hospital admission)  Prior to  Admission medications   Medication Sig Start Date End Date Taking? Authorizing Provider  azelastine (ASTELIN) 0.1 % nasal spray Place 2 sprays into both nostrils 2 (two) times daily. Use in each nostril as directed 07/02/16   Martinique, Betty G, MD  Calcium Carb-Cholecalciferol (CALCIUM 600 + D PO) Take 1 tablet by mouth daily.     [provider]  cycloSPORINE (RESTASIS) 0.05 % ophthalmic emulsion Place 1 drop into both eyes 2 (two) times daily as needed (dry eyes.). Reported on 08/07/2015    [provider]  fexofenadine (ALLEGRA) 180 MG tablet Take 1 tablet (180 mg total) by mouth daily. 02/09/13   Marletta Lor, MD  fluticasone [facility]) 50 MCG/ACT nasal spray Place 2 sprays into both nostrils daily. 07/07/16   Marletta Lor, MD  furosemide (LASIX) 40 MG tablet Take 1 tablet (40 mg total) by mouth daily. Patient taking differently: Take 40 mg by mouth daily. As needed 02/09/13   Marletta Lor, MD  ketoconazole (NIZORAL) 2 % shampoo Apply 1 application topically every 7 (seven) days.  10/31/15   [provider]  mometasone (NASONEX) 50 MCG/ACT nasal spray Place 1 spray into the nose daily. 07/02/16   Martinique, Betty G, MD  Multiple Vitamins-Minerals (PRESERVISION AREDS 2) CAPS Take 1 capsule by mouth 2 (two) times daily.    [provider]  nystatin-triamcinolone (MYCOLOG II) cream Apply 1 application topically 2 (two) times daily. 08/07/15   Marletta Lor, MD  pantoprazole (PROTONIX) 40 MG tablet Take 40 mg by mouth daily.  05/09/13   [provider]  triamcinolone (KENALOG) 0.1 % cream Apply 1 application topically daily as needed (dry skin). Reported on 07/18/2015    [provider]    Blood pressure (!) 131/46, pulse (!) 107, temperature 98.9 F (37.2 C), temperature source Oral, resp. rate 20, height '5\' 3"'$  (1.6 m), weight 170 lb (77.1 kg), SpO2 96 %. Physical Exam: General: pleasant, WD/WN white female who is laying in  bed in NAD HEENT: head is normocephalic, atraumatic.  Sclera are noninjected.  Pupils equal and round.  Ears and nose without any masses or lesions.  Mouth is pink and moist. Dentition fair Heart: regular, rate, and rhythm.  No obvious murmurs, gallops, or rubs noted.  Palpable pedal pulses bilaterally Lungs: CTAB, no wheezes, rhonchi, or rales noted.  Respiratory effort nonlabored Abd: soft, mild distension, +BS, no masses, hernias, or organomegaly. No current RUQ or epigastric TTP. Mild tenderness LLQ without rebound or guarding MS: all 4 extremities are symmetrical with no cyanosis or clubbing. Trace BLE edema Skin: warm and dry with no masses, lesions, or rashes Psych: A&Ox3 with an appropriate affect. Neuro: cranial nerves grossly intact, extremity CSM intact bilaterally, normal speech  Results for orders placed or performed during the hospital encounter of 08/12/17 (from the  past 48 hour(s))  Lipase, blood     Status: None   Collection Time: 08/12/17  6:05 AM  Result Value Ref Range   Lipase 33 11 - 51 U/L  Comprehensive metabolic panel     Status: Abnormal   Collection Time: 08/12/17  6:05 AM  Result Value Ref Range   Sodium 139 135 - 145 mmol/L   Potassium 3.7 3.5 - 5.1 mmol/L   Chloride 105 101 - 111 mmol/L   CO2 24 22 - 32 mmol/L   Glucose, Bld 158 (H) 65 - 99 mg/dL   BUN 27 (H) 6 - 20 mg/dL   Creatinine, Ser 1.15 (H) 0.44 - 1.00 mg/dL   Calcium 9.4 8.9 - 10.3 mg/dL   Total Protein 6.7 6.5 - 8.1 g/dL   Albumin 3.8 3.5 - 5.0 g/dL   AST 862 (H) 15 - 41 U/L   ALT 417 (H) 14 - 54 U/L   Alkaline Phosphatase 196 (H) 38 - 126 U/L   Total Bilirubin 1.5 (H) 0.3 - 1.2 mg/dL   GFR calc non Af Amer 40 (L) >60 mL/min   GFR calc Af Amer 46 (L) >60 mL/min    Comment: (NOTE) The eGFR has been calculated using the CKD EPI equation. This calculation has not been validated in all clinical situations. eGFR's persistently <60 mL/min signify possible Chronic Kidney Disease.    Anion gap  10 5 - 15  CBC with Differential     Status: Abnormal   Collection Time: 08/12/17  6:05 AM  Result Value Ref Range   WBC 9.5 4.0 - 10.5 K/uL   RBC 4.05 3.87 - 5.11 MIL/uL   Hemoglobin 12.1 12.0 - 15.0 g/dL   HCT 36.5 36.0 - 46.0 %   MCV 90.1 78.0 - 100.0 fL   MCH 29.9 26.0 - 34.0 pg   MCHC 33.2 30.0 - 36.0 g/dL   RDW 13.6 11.5 - 15.5 %   Platelets 161 150 - 400 K/uL   Neutrophils Relative % 96 %   Neutro Abs 9.2 (H) 1.7 - 7.7 K/uL   Lymphocytes Relative 2 %   Lymphs Abs 0.2 (L) 0.7 - 4.0 K/uL   Monocytes Relative 2 %   Monocytes Absolute 0.2 0.1 - 1.0 K/uL   Eosinophils Relative 0 %   Eosinophils Absolute 0.0 0.0 - 0.7 K/uL   Basophils Relative 0 %   Basophils Absolute 0.0 0.0 - 0.1 K/uL  Urinalysis, Routine w reflex microscopic     Status: Abnormal   Collection Time: 08/12/17  6:27 AM  Result Value Ref Range   Color, Urine YELLOW YELLOW   APPearance CLEAR CLEAR   Specific Gravity, Urine 1.014 1.005 - 1.030   pH 6.0 5.0 - 8.0   Glucose, UA NEGATIVE NEGATIVE mg/dL   Hgb urine dipstick SMALL (A) NEGATIVE   Bilirubin Urine NEGATIVE NEGATIVE   Ketones, ur NEGATIVE NEGATIVE mg/dL   Protein, ur NEGATIVE NEGATIVE mg/dL   Nitrite NEGATIVE NEGATIVE   Leukocytes, UA TRACE (A) NEGATIVE   RBC / HPF 0-5 0 - 5 RBC/hpf   WBC, UA 0-5 0 - 5 WBC/hpf   Bacteria, UA RARE (A) NONE SEEN   Squamous Epithelial / LPF 0-5 (A) NONE SEEN   Mucus PRESENT   I-Stat Troponin, ED (not at [facility])     Status: None   Collection Time: 08/12/17  7:11 AM  Result Value Ref Range   Troponin i, poc 0.06 0.00 - 0.08 ng/mL   Comment 3  Comment: Due to the release kinetics of cTnI, a negative result within the first hours of the onset of symptoms does not rule out myocardial infarction with certainty. If myocardial infarction is still suspected, repeat the test at appropriate intervals.   I-Stat CG4 Lactic Acid, ED     Status: Abnormal   Collection Time: 08/12/17  7:12 AM  Result Value Ref Range    Lactic Acid, Venous 2.92 (HH) 0.5 - 1.9 mmol/L   Comment NOTIFIED PHYSICIAN   I-Stat CG4 Lactic Acid, ED     Status: None   Collection Time: 08/12/17  8:45 AM  Result Value Ref Range   Lactic Acid, Venous 1.27 0.5 - 1.9 mmol/L   Dg Chest 2 View  Result Date: 08/12/2017 CLINICAL DATA:  History of breast carcinoma. Nausea and vomiting. Fever. EXAM: CHEST  2 VIEW COMPARISON:  Nov 28, 2015 FINDINGS: There is no edema or consolidation. Heart is mildly enlarged with pulmonary vascularity within normal limits. There is aortic atherosclerosis. No adenopathy. No blastic or lytic bone lesions. There is degenerative change in the thoracic spine. IMPRESSION: No edema or consolidation. Heart mildly enlarged. There is aortic atherosclerosis. Aortic Atherosclerosis (ICD10-I70.0). Electronically Signed   By: Lowella Grip III M.D.   On: 08/12/2017 08:40   US Abdomen Limited Ruq  Result Date: 08/12/2017 CLINICAL DATA:  Elevated liver enzymes with nausea and vomiting EXAM: ULTRASOUND ABDOMEN LIMITED RIGHT UPPER QUADRANT COMPARISON:  None. FINDINGS: Gallbladder: Within the gallbladder, there are echogenic foci which move and shadow consistent with cholelithiasis. Largest individual gallstone measures 9 mm in length. Gallbladder wall is mildly thickened with subtle edema. No pericholecystic fluid. No sonographic Murphy sign noted by sonographer. Common bile duct: Diameter: 8 mm, mildly prominent. No biliary duct mass or calculus evident. Liver: No focal lesion identified. Within normal limits in parenchymal echogenicity. Portal vein is patent on color Doppler imaging with normal direction of blood flow towards the liver. IMPRESSION: Cholelithiasis with mild gallbladder wall thickening. Gallbladder wall appears subtly edematous. These are findings concerning for early acute cholecystitis. It may be prudent in this regard to consider nuclear medicine hepatobiliary imaging study to assess for cystic duct patency. Mild  prominence of the common bile duct without mass or calculus evident. Electronically Signed   By: Lowella Grip III M.D.   On: 08/12/2017 08:13    Anti-infectives (From admission, onward)   Start     Dose/Rate Route Frequency Ordered Stop   08/13/17 0600  cefTRIAXone (ROCEPHIN) 2 g in dextrose 5 % 50 mL IVPB     2 g 100 mL/hr over 30 Minutes Intravenous Every 24 hours 08/12/17 1039     08/12/17 0715  cefTRIAXone (ROCEPHIN) 1 g in dextrose 5 % 50 mL IVPB  Status:  Discontinued     1 g 100 mL/hr over 30 Minutes Intravenous  Once 08/12/17 0700 08/12/17 0701   08/12/17 0715  cefTRIAXone (ROCEPHIN) 2 g in dextrose 5 % 50 mL IVPB     2 g 100 mL/hr over 30 Minutes Intravenous  Once 08/12/17 0701 08/12/17 0815       Assessment/Plan Chronic venous insufficiency - takes lasix PRN for BLE edema H/o breast cancer GERD - PPI AKI - Cr 1.15, continue IVF  Elevated LFTs Epigastric pain - no previous abdominal surgeries - 2 days of epigastric pain, nausea, vomiting - u/s shows cholelithiasis with mild gallbladder wall thickening, gallbladder wall appears subtly edematous, concerning for early acute cholecystitis - AST 862, ALT 417, alk phos 196,  total bilirubin 1.5, lipase 33 - WBC 9.5, TMAX 100.2  ID - rocephin 1/25>> VTE - SCDs FEN - IVF, NPO Foley - none Follow up - TBD  Plan - Admit to med-surg. Will ask GI to see for elevated LFTs/bilirubin. Continue IV rocephin, IVF, NPO. Will ask internal medicine to consult due to patient's age and comorbidities to optimize preoperative preparation.  Wellington Hampshire, [facility] Surgery 08/12/2017, 9:51 AM Pager: [phone] Consults: [phone] Mon-Fri 7:00 am-4:30 pm Sat-Sun 7:00 am-11:30 am

## 2017-08-12 NOTE — ED Notes (Signed)
Report given to Terri, RN.

## 2017-08-12 NOTE — ED Triage Notes (Signed)
Patient arrives by [facility] with complaints of abdominal pain, nausea and vomiting since 0100. EMS started IV-given Zofran 4 mg IV. Nausea decreased after Zofran. Patient is pale-Temp at facility 99.9. Husband just had pneumonia.

## 2017-08-12 NOTE — ED Notes (Signed)
Bed: MV36 Expected date:  Expected time:  Means of arrival:  Comments: EMS 84 yo female from facility, nausea and vomiting since 0100-abdominal pain, weak-IV fluid and Zofran

## 2017-08-13 ENCOUNTER — Observation Stay (HOSPITAL_COMMUNITY): Payer: MEDICARE | Admitting: Certified Registered Nurse Anesthetist

## 2017-08-13 ENCOUNTER — Observation Stay (HOSPITAL_COMMUNITY): Payer: MEDICARE

## 2017-08-13 ENCOUNTER — Encounter (HOSPITAL_COMMUNITY): Payer: Self-pay | Admitting: Certified Registered Nurse Anesthetist

## 2017-08-13 ENCOUNTER — Encounter (HOSPITAL_COMMUNITY): Admission: EM | Disposition: A | Discharge status: Home / Self Care | Payer: Self-pay | Source: Home / Self Care

## 2017-08-13 DIAGNOSIS — K219 Gastro-esophageal reflux disease without esophagitis: Secondary | ICD-10-CM | POA: Diagnosis not present

## 2017-08-13 DIAGNOSIS — K812 Acute cholecystitis with chronic cholecystitis: Secondary | ICD-10-CM

## 2017-08-13 DIAGNOSIS — R1013 Epigastric pain: Secondary | ICD-10-CM | POA: Diagnosis not present

## 2017-08-13 DIAGNOSIS — D638 Anemia in other chronic diseases classified elsewhere: Secondary | ICD-10-CM | POA: Diagnosis present

## 2017-08-13 DIAGNOSIS — I10 Essential (primary) hypertension: Secondary | ICD-10-CM

## 2017-08-13 DIAGNOSIS — K8010 Calculus of gallbladder with chronic cholecystitis without obstruction: Secondary | ICD-10-CM | POA: Diagnosis not present

## 2017-08-13 DIAGNOSIS — I519 Heart disease, unspecified: Secondary | ICD-10-CM | POA: Diagnosis not present

## 2017-08-13 DIAGNOSIS — I5189 Other ill-defined heart diseases: Secondary | ICD-10-CM | POA: Diagnosis present

## 2017-08-13 DIAGNOSIS — K8066 Calculus of gallbladder and bile duct with acute and chronic cholecystitis without obstruction: Secondary | ICD-10-CM | POA: Diagnosis not present

## 2017-08-13 DIAGNOSIS — K828 Other specified diseases of gallbladder: Secondary | ICD-10-CM | POA: Diagnosis not present

## 2017-08-13 DIAGNOSIS — D649 Anemia, unspecified: Secondary | ICD-10-CM | POA: Diagnosis not present

## 2017-08-13 DIAGNOSIS — C50411 Malignant neoplasm of upper-outer quadrant of right female breast: Secondary | ICD-10-CM | POA: Diagnosis not present

## 2017-08-13 DIAGNOSIS — J309 Allergic rhinitis, unspecified: Secondary | ICD-10-CM | POA: Diagnosis not present

## 2017-08-13 DIAGNOSIS — E042 Nontoxic multinodular goiter: Secondary | ICD-10-CM | POA: Diagnosis not present

## 2017-08-13 HISTORY — DX: Essential (primary) hypertension: I10

## 2017-08-13 HISTORY — PX: CHOLECYSTECTOMY: SHX55

## 2017-08-13 LAB — CBC
HEMATOCRIT: 31.3 % — AB (ref 36.0–46.0)
Hemoglobin: 10.4 g/dL — ABNORMAL LOW (ref 12.0–15.0)
MCH: 30.4 pg (ref 26.0–34.0)
MCHC: 33.2 g/dL (ref 30.0–36.0)
MCV: 91.5 fL (ref 78.0–100.0)
PLATELETS: 130 10*3/uL — AB (ref 150–400)
RBC: 3.42 MIL/uL — AB (ref 3.87–5.11)
RDW: 14.1 % (ref 11.5–15.5)
WBC: 7.7 10*3/uL (ref 4.0–10.5)

## 2017-08-13 LAB — COMPREHENSIVE METABOLIC PANEL
ALT: 386 U/L — ABNORMAL HIGH (ref 14–54)
AST: 341 U/L — ABNORMAL HIGH (ref 15–41)
Albumin: 3.1 g/dL — ABNORMAL LOW (ref 3.5–5.0)
Alkaline Phosphatase: 169 U/L — ABNORMAL HIGH (ref 38–126)
Anion gap: 6 (ref 5–15)
BILIRUBIN TOTAL: 1.3 mg/dL — AB (ref 0.3–1.2)
BUN: 19 mg/dL (ref 6–20)
CHLORIDE: 105 mmol/L (ref 101–111)
CO2: 23 mmol/L (ref 22–32)
Calcium: 7.9 mg/dL — ABNORMAL LOW (ref 8.9–10.3)
Creatinine, Ser: 1.11 mg/dL — ABNORMAL HIGH (ref 0.44–1.00)
GFR, EST AFRICAN AMERICAN: 48 mL/min — AB (ref >60)
GFR, EST NON AFRICAN AMERICAN: 41 mL/min — AB (ref >60)
Glucose, Bld: 108 mg/dL — ABNORMAL HIGH (ref 65–99)
Potassium: 3.5 mmol/L (ref 3.5–5.1)
Sodium: 134 mmol/L — ABNORMAL LOW (ref 135–145)
TOTAL PROTEIN: 5.6 g/dL — AB (ref 6.5–8.1)

## 2017-08-13 LAB — MAGNESIUM: Magnesium: 1.7 mg/dL (ref 1.7–2.4)

## 2017-08-13 LAB — SURGICAL PCR SCREEN
MRSA, PCR: NEGATIVE
STAPHYLOCOCCUS AUREUS: NEGATIVE

## 2017-08-13 SURGERY — LAPAROSCOPIC CHOLECYSTECTOMY WITH INTRAOPERATIVE CHOLANGIOGRAM
Anesthesia: General | Site: Abdomen

## 2017-08-13 MED ORDER — LIDOCAINE 2% (20 MG/ML) 5 ML SYRINGE
INTRAMUSCULAR | Status: AC
  Filled 2017-08-13: qty 5

## 2017-08-13 MED ORDER — PROPOFOL 10 MG/ML IV BOLUS
INTRAVENOUS | Status: DC | PRN
  Administered 2017-08-13: 110 mg via INTRAVENOUS

## 2017-08-13 MED ORDER — SUGAMMADEX SODIUM 200 MG/2ML IV SOLN
INTRAVENOUS | Status: DC | PRN
  Administered 2017-08-13: 200 mg via INTRAVENOUS

## 2017-08-13 MED ORDER — 0.9 % SODIUM CHLORIDE (POUR BTL) OPTIME
TOPICAL | Status: DC | PRN
  Administered 2017-08-13: 1000 mL

## 2017-08-13 MED ORDER — ROCURONIUM BROMIDE 50 MG/5ML IV SOSY
PREFILLED_SYRINGE | INTRAVENOUS | Status: AC
  Filled 2017-08-13: qty 5

## 2017-08-13 MED ORDER — BUPIVACAINE-EPINEPHRINE (PF) 0.5% -1:200000 IJ SOLN
INTRAMUSCULAR | Status: AC
  Filled 2017-08-13: qty 30

## 2017-08-13 MED ORDER — PHENYLEPHRINE 40 MCG/ML (10ML) SYRINGE FOR IV PUSH (FOR BLOOD PRESSURE SUPPORT)
PREFILLED_SYRINGE | INTRAVENOUS | Status: DC | PRN
  Administered 2017-08-13: 80 ug via INTRAVENOUS

## 2017-08-13 MED ORDER — ONDANSETRON HCL 4 MG/2ML IJ SOLN
INTRAMUSCULAR | Status: AC
  Filled 2017-08-13: qty 2

## 2017-08-13 MED ORDER — FENTANYL CITRATE (PF) 100 MCG/2ML IJ SOLN
INTRAMUSCULAR | Status: AC
  Filled 2017-08-13: qty 2

## 2017-08-13 MED ORDER — BUPIVACAINE-EPINEPHRINE 0.5% -1:200000 IJ SOLN
INTRAMUSCULAR | Status: DC | PRN
  Administered 2017-08-13: 30 mL

## 2017-08-13 MED ORDER — ROCURONIUM BROMIDE 10 MG/ML (PF) SYRINGE
PREFILLED_SYRINGE | INTRAVENOUS | Status: DC | PRN
  Administered 2017-08-13: 30 mg via INTRAVENOUS
  Administered 2017-08-13: 10 mg via INTRAVENOUS

## 2017-08-13 MED ORDER — POTASSIUM CHLORIDE CRYS ER 20 MEQ PO TBCR: 40.0000 meq | EXTENDED_RELEASE_TABLET | Freq: Once | ORAL | Status: DC

## 2017-08-13 MED ORDER — OXYCODONE HCL 5 MG PO TABS: 5.0000 mg | ORAL_TABLET | Freq: Once | ORAL | Status: DC | PRN

## 2017-08-13 MED ORDER — DEXAMETHASONE SODIUM PHOSPHATE 10 MG/ML IJ SOLN
INTRAMUSCULAR | Status: AC
  Filled 2017-08-13: qty 1

## 2017-08-13 MED ORDER — OXYCODONE HCL 5 MG/5ML PO SOLN
5.0000 mg | Freq: Once | ORAL | Status: DC | PRN
  Filled 2017-08-13: qty 5

## 2017-08-13 MED ORDER — IOPAMIDOL (ISOVUE-300) INJECTION 61%
INTRAVENOUS | Status: DC | PRN
  Administered 2017-08-13: 5 mL

## 2017-08-13 MED ORDER — IOPAMIDOL (ISOVUE-300) INJECTION 61%
INTRAVENOUS | Status: AC
  Filled 2017-08-13: qty 50

## 2017-08-13 MED ORDER — PROMETHAZINE HCL 25 MG/ML IJ SOLN: 6.2500 mg | INTRAMUSCULAR | Status: DC | PRN

## 2017-08-13 MED ORDER — MAGNESIUM SULFATE 4 GM/100ML IV SOLN
4.0000 g | Freq: Once | INTRAVENOUS | Status: DC
  Filled 2017-08-13: qty 100

## 2017-08-13 MED ORDER — ACETAMINOPHEN 500 MG PO TABS
500.0000 mg | ORAL_TABLET | Freq: Three times a day (TID) | ORAL | Status: DC
  Administered 2017-08-13 – 2017-08-15 (×6): 500 mg via ORAL
  Filled 2017-08-13 (×6): qty 1

## 2017-08-13 MED ORDER — PROPOFOL 10 MG/ML IV BOLUS
INTRAVENOUS | Status: AC
  Filled 2017-08-13: qty 20

## 2017-08-13 MED ORDER — KCL-LACTATED RINGERS-D5W 20 MEQ/L IV SOLN
INTRAVENOUS | Status: DC
  Administered 2017-08-13 – 2017-08-14 (×2): via INTRAVENOUS
  Filled 2017-08-13 (×2): qty 1000

## 2017-08-13 MED ORDER — LACTATED RINGERS IR SOLN
Status: DC | PRN
  Administered 2017-08-13: 1000 mL

## 2017-08-13 MED ORDER — LACTATED RINGERS IV SOLN
INTRAVENOUS | Status: DC | PRN
  Administered 2017-08-13: 14:00:00 via INTRAVENOUS

## 2017-08-13 MED ORDER — SUGAMMADEX SODIUM 200 MG/2ML IV SOLN
INTRAVENOUS | Status: AC
  Filled 2017-08-13: qty 2

## 2017-08-13 MED ORDER — PHENYLEPHRINE 40 MCG/ML (10ML) SYRINGE FOR IV PUSH (FOR BLOOD PRESSURE SUPPORT)
PREFILLED_SYRINGE | INTRAVENOUS | Status: AC
  Filled 2017-08-13: qty 10

## 2017-08-13 MED ORDER — DEXAMETHASONE SODIUM PHOSPHATE 4 MG/ML IJ SOLN
INTRAMUSCULAR | Status: DC | PRN
  Administered 2017-08-13: 4 mg via INTRAVENOUS

## 2017-08-13 MED ORDER — HYDROMORPHONE HCL 1 MG/ML IJ SOLN: 0.2500 mg | INTRAMUSCULAR | Status: DC | PRN

## 2017-08-13 MED ORDER — LIDOCAINE 2% (20 MG/ML) 5 ML SYRINGE
INTRAMUSCULAR | Status: DC | PRN
  Administered 2017-08-13: 80 mg via INTRAVENOUS

## 2017-08-13 SURGICAL SUPPLY — 40 items
ADH SKN CLS APL DERMABOND .7 (GAUZE/BANDAGES/DRESSINGS) ×1
APL SRG 38 LTWT LNG FL B (MISCELLANEOUS) ×1
APPLICATOR ARISTA FLEXITIP XL (MISCELLANEOUS) ×2 IMPLANT
APPLIER CLIP ROT 10 11.4 M/L (STAPLE) ×3
APR CLP MED LRG 11.4X10 (STAPLE) ×1
BAG SPEC RTRVL 10 TROC 200 (ENDOMECHANICALS) ×1
BAG SPEC RTRVL LRG 6X4 10 (ENDOMECHANICALS)
CABLE HIGH FREQUENCY MONO STRZ (ELECTRODE) ×3 IMPLANT
CATH REDDICK CHOLANGI 4FR 50CM (CATHETERS) IMPLANT
CHLORAPREP W/TINT 26ML (MISCELLANEOUS) ×3 IMPLANT
CLIP APPLIE ROT 10 11.4 M/L (STAPLE) ×1 IMPLANT
COVER MAYO STAND STRL (DRAPES) ×3 IMPLANT
COVER SURGICAL LIGHT HANDLE (MISCELLANEOUS) ×3 IMPLANT
DECANTER SPIKE VIAL GLASS SM (MISCELLANEOUS) ×3 IMPLANT
DERMABOND ADVANCED (GAUZE/BANDAGES/DRESSINGS) ×2
DERMABOND ADVANCED .7 DNX12 (GAUZE/BANDAGES/DRESSINGS) ×1 IMPLANT
DRAPE C-ARM 42X120 X-RAY (DRAPES) ×3 IMPLANT
ELECT REM PT RETURN 15FT ADLT (MISCELLANEOUS) ×3 IMPLANT
GLOVE BIOGEL PI IND STRL 7.5 (GLOVE) ×1 IMPLANT
GLOVE BIOGEL PI INDICATOR 7.5 (GLOVE) ×2
GLOVE ECLIPSE 7.5 STRL STRAW (GLOVE) ×3 IMPLANT
GOWN STRL REUS W/TWL XL LVL3 (GOWN DISPOSABLE) ×9 IMPLANT
HEMOSTAT ARISTA ABSORB 3G PWDR (MISCELLANEOUS) ×2 IMPLANT
HEMOSTAT SNOW SURGICEL 2X4 (HEMOSTASIS) IMPLANT
HEMOSTAT SURGICEL 4X8 (HEMOSTASIS) IMPLANT
KIT BASIN OR (CUSTOM PROCEDURE TRAY) ×3 IMPLANT
POUCH RETRIEVAL ECOSAC 10 (ENDOMECHANICALS) IMPLANT
POUCH RETRIEVAL ECOSAC 10MM (ENDOMECHANICALS) ×2
POUCH SPECIMEN RETRIEVAL 10MM (ENDOMECHANICALS) IMPLANT
SCISSORS METZENBAUM CVD 33 (INSTRUMENTS) ×3 IMPLANT
SET CHOLANGIOGRAPH MIX (MISCELLANEOUS) ×3 IMPLANT
SET IRRIG TUBING LAPAROSCOPIC (IRRIGATION / IRRIGATOR) ×3 IMPLANT
SLEEVE XCEL OPT CAN 5 100 (ENDOMECHANICALS) ×3 IMPLANT
SUT MNCRL AB 4-0 PS2 18 (SUTURE) ×3 IMPLANT
TOWEL OR 17X26 10 PK STRL BLUE (TOWEL DISPOSABLE) ×3 IMPLANT
TRAY LAPAROSCOPIC (CUSTOM PROCEDURE TRAY) ×3 IMPLANT
TROCAR BLADELESS OPT 5 100 (ENDOMECHANICALS) ×3 IMPLANT
TROCAR XCEL BLUNT TIP 100MML (ENDOMECHANICALS) ×3 IMPLANT
TROCAR XCEL NON-BLD 11X100MML (ENDOMECHANICALS) ×3 IMPLANT
TUBING INSUF HEATED (TUBING) ×3 IMPLANT

## 2017-08-13 NOTE — Transfer of Care (Signed)
Immediate Anesthesia Transfer of Care Note  Patient: [name]  Procedure(s) Performed: LAPAROSCOPIC CHOLECYSTECTOMY WITH INTRAOPERATIVE CHOLANGIOGRAM (N/A Abdomen)  Patient Location: PACU  Anesthesia Type:General  Level of Consciousness: drowsy and patient cooperative  Airway & Oxygen Therapy: Patient Spontanous Breathing and Patient connected to face mask  Post-op Assessment: Report given to RN and Post -op Vital signs reviewed and stable  Post vital signs: Reviewed and stable  Last Vitals:  Vitals:   08/12/17 2100 08/13/17 0616  BP: (!) 160/59 (!) 144/57  Pulse: 93 81  Resp: 18 18  Temp: 37.4 C 37 C  SpO2: 95% 98%    Last Pain:  Vitals:   08/13/17 0616  TempSrc: Oral  PainSc:          Complications: No apparent anesthesia complications

## 2017-08-13 NOTE — Progress Notes (Signed)
(  no charge--pt not seen)  Improvement in LFT's noted, as well as plans for surgery.  We will be on standby--please call us if IOC is + or if we can be of other assistance in this patient's care.  Otherwise, we will not plan to see further.  Cleotis Nipper, M.D. Pager [phone] If no answer or after 5 PM call [phone]

## 2017-08-13 NOTE — Op Note (Signed)
Preoperative Diagnosis: Cholelithiasis and cholecystitis  Postoprative Diagnosis: Same  Procedure: Procedure(s): LAPAROSCOPIC CHOLECYSTECTOMY WITH INTRAOPERATIVE CHOLANGIOGRAM   Surgeon: Excell Seltzer T   Assistants: None  Anesthesia:  General endotracheal anesthesia  Indications: Patient is a remarkably healthy 84 year old female who presents with acute epigastric and right upper quadrant abdominal pain.  She had moderately elevated LFTs, thickened gallbladder wall and stones.  Overnight her LFTs have trended down.  We have recommended proceeding with laparoscopic cholecystectomy with cholangiogram.  I discussed the procedure and indications and risks with the patient detailed elsewhere and she understands and agrees to proceed.    Procedure Detail: Patient was brought to the operating room, placed in the supine position on the operating table, and general endotracheal anesthesia induced.  The abdomen was widely sterilely prepped and draped.  She was already on broad-spectrum IV antibiotics.  PAS were in place.  Patient timeout was performed and correct procedure verified.  Trocar sites were infiltrated with local anesthesia.  Access was obtained with a 1-1/2 cm incision at the umbilicus with an open Hassan cannula through a mattress suture of 0 Vicryl.  Under direct vision an 11 mm trocar was placed subxiphoid and 2 5 mm trochars along the right subcostal margin.  The gallbladder was seen to be chronically thickened as well as edematous with early acute inflammation.  There were 2 fairly extensive omental adhesions up out of the gallbladder that were carefully taken down exposing the fundus which was retracted inferolaterally.  Edema and some chronic inflammation continued down to the porta hepatis.  The distal gallbladder and Calot's triangle was thoroughly dissected.  The distal gallbladder was dissected off of the cholecystic plate.  A good critical view was obtained and when the anatomy  appeared clear the cystic duct was clipped of the gallbladder junction and an operative cholangiogram obtained through the cystic duct.  This showed good filling of a mildly dilated common bile duct and intrahepatic ducts with free flow into the duodenum and no filling defects.  The Cholangiocath was removed and the cystic duct was triply clipped proximally and divided.  The cystic artery was doubly clipped proximally, clipped distally and divided.  The gallbladder was then dissected free from its bed using hook cautery and placed in an eco-catch bag and removed through the umbilicus.  Hemostasis was obtained in the gallbladder bed with cautery.  There was some raw surface and the gallbladder bed was coated with Arista.  There was no bleeding or evidence of injury.  All CO2 was evacuated and the mattress suture secured to the umbilicus and trochars removed.  Skin incisions were closed with subarticular Monocryl and Dermabond.  Sponge needle and instrument counts were correct.    Findings: Acute on chronic cholecystitis.  Normal operative cholangiogram.  Estimated Blood Loss:  less than 50 mL         Drains: None  Blood Given: none          Specimens: Gallbladder and contents        Complications:  * No complications entered in OR log *         Disposition: PACU - hemodynamically stable.         Condition: stable

## 2017-08-13 NOTE — Anesthesia Preprocedure Evaluation (Signed)
Anesthesia Evaluation  Patient identified by MRN, date of birth, ID band Patient awake    Reviewed: Allergy & Precautions, NPO status , Patient's Chart, lab work & pertinent test results  Airway Mallampati: II  TM Distance: >3 FB Neck ROM: full    Dental no notable dental hx.    Pulmonary neg pulmonary ROS,    Pulmonary exam normal breath sounds clear to auscultation       Cardiovascular + Peripheral Vascular Disease  negative cardio ROS Normal cardiovascular exam Rhythm:Regular Rate:Normal     Neuro/Psych negative neurological ROS  negative psych ROS   GI/Hepatic negative GI ROS, Neg liver ROS,   Endo/Other  negative endocrine ROSobese  Renal/GU negative Renal ROS  negative genitourinary   Musculoskeletal negative musculoskeletal ROS (+) Arthritis , Osteoarthritis,    Abdominal   Peds negative pediatric ROS (+)  Hematology negative hematology ROS (+)   Anesthesia Other Findings Cholecystitis  Reproductive/Obstetrics negative OB ROS Breast CA                             Anesthesia Physical  Anesthesia Plan  ASA: III  Anesthesia Plan: General   Post-op Pain Management:    Induction: Intravenous  PONV Risk Score and Plan: 3 and Ondansetron, Dexamethasone and Midazolam  Airway Management Planned: Oral ETT  Additional Equipment:   Intra-op Plan:   Post-operative Plan: Extubation in OR  Informed Consent: I have reviewed the patients History and Physical, chart, labs and discussed the procedure including the risks, benefits and alternatives for the proposed anesthesia with the patient or authorized representative who has indicated his/her understanding and acceptance.   Dental advisory given  Plan Discussed with: CRNA, Anesthesiologist and Surgeon  Anesthesia Plan Comments:         Anesthesia Quick Evaluation

## 2017-08-13 NOTE — Progress Notes (Signed)
Patient ID: [name], female   DOB: March 22, 1922, 84 y.o.   MRN: [id number]     Subjective: No complaints this morning.  Denies pain or nausea.  Objective: Vital signs in last 24 hours: Temp:  [98.6 F (37 C)-99.3 F (37.4 C)] 98.6 F (37 C) (01/26 0616) Pulse Rate:  [81-107] 81 (01/26 0616) Resp:  [18-22] 18 (01/26 0616) BP: (131-160)/(46-73) 144/57 (01/26 0616) SpO2:  [92 %-98 %] 98 % (01/26 0616) Last BM Date: 08/13/17  Intake/Output from previous day: 01/25 0701 - 01/26 0700 In: 2972.5 [P.O.:480; I.V.:1442.5; IV Piggyback:1050] Out: 800 [Urine:800] Intake/Output this shift: No intake/output data recorded.  General appearance: alert, cooperative and no distress Resp: clear to auscultation bilaterally GI: Mild right upper quadrant tenderness without guarding.  No palpable masses.  Lab Results:  Recent Labs    08/12/17 0605 08/13/17 0405  WBC 9.5 7.7  HGB 12.1 10.4*  HCT 36.5 31.3*  PLT 161 130*   BMET Recent Labs    08/12/17 0605 08/13/17 0405  NA 139 134*  K 3.7 3.5  CL 105 105  CO2 24 23  GLUCOSE 158* 108*  BUN 27* 19  CREATININE 1.15* 1.11*  CALCIUM 9.4 7.9*     Studies/Results: Dg Chest 2 View  Result Date: 08/12/2017 CLINICAL DATA:  History of breast carcinoma. Nausea and vomiting. Fever. EXAM: CHEST  2 VIEW COMPARISON:  Nov 28, 2015 FINDINGS: There is no edema or consolidation. Heart is mildly enlarged with pulmonary vascularity within normal limits. There is aortic atherosclerosis. No adenopathy. No blastic or lytic bone lesions. There is degenerative change in the thoracic spine. IMPRESSION: No edema or consolidation. Heart mildly enlarged. There is aortic atherosclerosis. Aortic Atherosclerosis (ICD10-I70.0). Electronically Signed   By: Lowella Grip III M.D.   On: 08/12/2017 08:40   US Abdomen Limited Ruq  Result Date: 08/12/2017 CLINICAL DATA:  Elevated liver enzymes with nausea and vomiting EXAM: ULTRASOUND ABDOMEN LIMITED RIGHT UPPER  QUADRANT COMPARISON:  None. FINDINGS: Gallbladder: Within the gallbladder, there are echogenic foci which move and shadow consistent with cholelithiasis. Largest individual gallstone measures 9 mm in length. Gallbladder wall is mildly thickened with subtle edema. No pericholecystic fluid. No sonographic Murphy sign noted by sonographer. Common bile duct: Diameter: 8 mm, mildly prominent. No biliary duct mass or calculus evident. Liver: No focal lesion identified. Within normal limits in parenchymal echogenicity. Portal vein is patent on color Doppler imaging with normal direction of blood flow towards the liver. IMPRESSION: Cholelithiasis with mild gallbladder wall thickening. Gallbladder wall appears subtly edematous. These are findings concerning for early acute cholecystitis. It may be prudent in this regard to consider nuclear medicine hepatobiliary imaging study to assess for cystic duct patency. Mild prominence of the common bile duct without mass or calculus evident. Electronically Signed   By: Lowella Grip III M.D.   On: 08/12/2017 08:13    Anti-infectives: Anti-infectives (From admission, onward)   Start     Dose/Rate Route Frequency Ordered Stop   08/13/17 0600  cefTRIAXone (ROCEPHIN) 2 g in dextrose 5 % 50 mL IVPB     2 g 100 mL/hr over 30 Minutes Intravenous Every 24 hours 08/12/17 1039     08/12/17 0715  cefTRIAXone (ROCEPHIN) 1 g in dextrose 5 % 50 mL IVPB  Status:  Discontinued     1 g 100 mL/hr over 30 Minutes Intravenous  Once 08/12/17 0700 08/12/17 0701   08/12/17 0715  cefTRIAXone (ROCEPHIN) 2 g in dextrose 5 % 50 mL  IVPB     2 g 100 mL/hr over 30 Minutes Intravenous  Once 08/12/17 0701 08/12/17 0815      Assessment/Plan: Acute abdominal pain with gallbladder wall thickening and stones.  Elevated LFTs which are trending downward today.  Abdominal pain and tenderness is improved. Apparent early cholecystitis plus/minus common bile duct stone.  May have passed a stone as  LFTs are trending down. Remarkably good health and functional at age 84. As per previous plan I have recommended proceeding with laparoscopic cholecystectomy with cholangiogram.  We will plan to go ahead with this today.  Check results of echocardiogram preoperatively. I discussed the procedure in detail with the patient including its indications and nature, expected recovery, risks of anesthetic complications, bleeding, infection or injury to surrounding structures.  She understands and agrees to proceed.    LOS: 0 days    Edward Jolly 08/13/2017

## 2017-08-13 NOTE — Anesthesia Postprocedure Evaluation (Signed)
Anesthesia Post Note  Patient: [name]  Procedure(s) Performed: LAPAROSCOPIC CHOLECYSTECTOMY WITH INTRAOPERATIVE CHOLANGIOGRAM (N/A Abdomen)     Patient location during evaluation: PACU Anesthesia Type: General Level of consciousness: awake and alert Pain management: pain level controlled Vital Signs Assessment: post-procedure vital signs reviewed and stable Respiratory status: spontaneous breathing, nonlabored ventilation and respiratory function stable Cardiovascular status: blood pressure returned to baseline and stable Postop Assessment: no apparent nausea or vomiting Anesthetic complications: no    Last Vitals:  Vitals:   08/13/17 1545 08/13/17 1600  BP: (!) 167/66 (!) 167/64  Pulse: 83 82  Resp: (!) 22 (!) 21  Temp:  36.9 C  SpO2: 100% 99%    Last Pain:  Vitals:   08/13/17 1600  TempSrc:   PainSc: 0-No pain                 Lynda Rainwater

## 2017-08-13 NOTE — Anesthesia Procedure Notes (Signed)
Procedure Name: Intubation Date/Time: 08/13/2017 2:09 PM Performed by: Claudia Desanctis, CRNA Pre-anesthesia Checklist: Patient identified, Emergency Drugs available, Suction available and Patient being monitored Patient Re-evaluated:Patient Re-evaluated prior to induction Oxygen Delivery Method: Circle system utilized Preoxygenation: Pre-oxygenation with 100% oxygen Induction Type: IV induction Ventilation: Mask ventilation without difficulty Laryngoscope Size: 2 and Miller Grade View: Grade I Tube type: Oral Tube size: 7.0 mm Number of attempts: 1 Airway Equipment and Method: Stylet Placement Confirmation: ETT inserted through vocal cords under direct vision,  positive ETCO2 and breath sounds checked- equal and bilateral Secured at: 21 cm Tube secured with: Tape Dental Injury: Teeth and Oropharynx as per pre-operative assessment

## 2017-08-13 NOTE — Progress Notes (Signed)
CONSULT/PROGRESS NOTE    [name]  AST:[id number] DOB: 1921-11-05 DOA: 08/12/2017 PCP: Marletta Lor, MD   Brief Narrative:  Patient is a 84 year old highly functional female with past medical history of tinnitus, history of breast cancer, chronic venous insufficiency, diet-controlled hypertension, gastroesophageal reflux disease presented to the ED with abdominal pain nausea and vomiting.  Abdominal ultrasound which was done was worrisome for an acute cholecystitis.  Patient was admitted by the general surgical service with plans for laparoscopic cholecystectomy on 08/13/2017.  [facility]ist were consulted for preop optimization and assistance with chronic medical management.  Patient noted to be low risk for surgical intervention.  2D echo which was obtained with a EF of 55-60%, no wall motion abnormalities, grade 2 diastolic dysfunction, very mild aortic valvular stenosis.  Assessment & Plan:   Principal Problem:   Acute on chronic cholecystitis Active Problems:   Osteoarthritis   Grade II diastolic dysfunction   HTN (hypertension)   Anemia   GERD (gastroesophageal reflux disease)  #1 acute on chronic cholecystitis .  Abdominal ultrasound.  LFTs trending down.  Patient status post laparoscopic cholecystectomy with intraoperative cholangiogram.  GI following.  Continue IV fluids.  Per primary team.  2.  Hypertension Patient noted to have systolic blood pressures in the 160s-170s postoperatively.  Patient complained of abdominal pain which is likely contributing to elevated blood pressure.  Patient currently on IV metoprolol as needed.  Will place on scheduled Tylenol 500 mg 3 times daily.  Patient receiving pain medications per primary service.  If blood pressure does not improve after pain is controlled will start low-dose Norvasc 2.5 mg daily and follow.  3.  Gastroesophageal reflux disease PPI.  4.  Chronic venous insufficiency TED hose was ordered.  Diuretics on  hold.  5.  Allergic rhinitis Continue Flonase.  6.  Anemia Likely anemia of chronic disease.  Check an anemia panel.  Follow H&H.  7.  Grade 2 diastolic dysfunction Currently compensated.  2D echo with EF of 55-60%, no wall motion abnormalities, grade 2 diastolic dysfunction, mild aortic valvular stenosis.  Monitor volume status closely.  Diuretics on hold.   DVT prophylaxis: SCDs Code Status: Full Family Communication: Updated patient, husband, niece at bedside. Disposition Plan: Per primary team.   Consultants:   [facility] hospitalist: Dr. Dyann Kief 08/12/2017  Gastroenterology: Dr. Paulita Fujita 08/12/2017  Procedures:   Chest x-ray 08/12/2017  Right upper quadrant ultrasound 08/12/2017  Laparoscopic cholecystectomy with intraoperative cholangiogram per Dr. Excell Seltzer 08/13/2017  Antimicrobials:   None   Subjective: Patient postop just returned from the OR.  Patient states she feels like somebody punched her in her tummy.  No chest pain.  No shortness of breath.  Objective: Vitals:   08/13/17 1625 08/13/17 1642 08/13/17 1654 08/13/17 1754  BP: (!) 177/68 (!) 171/68 (!) 168/82 (!) 161/70  Pulse: 81 80 79 79  Resp: (!) 23 20  20   Temp: 98.2 F (36.8 C) 98.3 F (36.8 C)  98.7 F (37.1 C)  TempSrc:    Oral  SpO2: 97% 98%  100%  Weight:      Height:        Intake/Output Summary (Last 24 hours) at 08/13/2017 1839 Last data filed at 08/13/2017 1622 Gross per 24 hour  Intake 1860 ml  Output 610 ml  Net 1250 ml   Filed Weights   08/12/17 0601  Weight: 77.1 kg (170 lb)    Examination:  General exam: Appears calm and comfortable  Respiratory system: Clear to auscultation  anterior lung fields.  No wheezes, no crackles, no rhonchi. Respiratory effort normal. Cardiovascular system: S1 & S2 heard, RRR. No JVD, murmurs, rubs, gallops or clicks. No pedal edema. Gastrointestinal system: Abdomen is nondistended, soft and nontender. No organomegaly or masses felt. Normal bowel sounds  heard. Central nervous system: Alert and oriented. No focal neurological deficits. Extremities: Symmetric 5 x 5 power. Skin: No rashes, lesions or ulcers Psychiatry: Judgement and insight appear normal. Mood & affect appropriate.     Data Reviewed: I have personally reviewed following labs and imaging studies  CBC: Recent Labs  Lab 08/12/17 0605 08/13/17 0405  WBC 9.5 7.7  NEUTROABS 9.2*  --   HGB 12.1 10.4*  HCT 36.5 31.3*  MCV 90.1 91.5  PLT 161 099*   Basic Metabolic Panel: Recent Labs  Lab 08/12/17 0605 08/13/17 0405 08/13/17 0852  NA 139 134*  --   K 3.7 3.5  --   CL 105 105  --   CO2 24 23  --   GLUCOSE 158* 108*  --   BUN 27* 19  --   CREATININE 1.15* 1.11*  --   CALCIUM 9.4 7.9*  --   MG  --   --  1.7   GFR: Estimated Creatinine Clearance: 31.1 mL/min (A) (by C-G formula based on SCr of 1.11 mg/dL (H)). Liver Function Tests: Recent Labs  Lab 08/12/17 0605 08/13/17 0405  AST 862* 341*  ALT 417* 386*  ALKPHOS 196* 169*  BILITOT 1.5* 1.3*  PROT 6.7 5.6*  ALBUMIN 3.8 3.1*   Recent Labs  Lab 08/12/17 0605  LIPASE 33   No results for input(s): AMMONIA in the last 168 hours. Coagulation Profile: No results for input(s): INR, PROTIME in the last 168 hours. Cardiac Enzymes: No results for input(s): CKTOTAL, CKMB, CKMBINDEX, TROPONINI in the last 168 hours. BNP (last 3 results) No results for input(s): PROBNP in the last 8760 hours. HbA1C: No results for input(s): HGBA1C in the last 72 hours. CBG: No results for input(s): GLUCAP in the last 168 hours. Lipid Profile: No results for input(s): CHOL, HDL, LDLCALC, TRIG, CHOLHDL, LDLDIRECT in the last 72 hours. Thyroid Function Tests: No results for input(s): TSH, T4TOTAL, FREET4, T3FREE, THYROIDAB in the last 72 hours. Anemia Panel: No results for input(s): VITAMINB12, FOLATE, FERRITIN, TIBC, IRON, RETICCTPCT in the last 72 hours. Sepsis Labs: Recent Labs  Lab 08/12/17 8338 08/12/17 0845    LATICACIDVEN 2.92* 1.27    Recent Results (from the past 240 hour(s))  Surgical pcr screen     Status: None   Collection Time: 08/13/17 12:00 PM  Result Value Ref Range Status   MRSA, PCR NEGATIVE NEGATIVE Final   Staphylococcus aureus NEGATIVE NEGATIVE Final    Comment: (NOTE) The Xpert SA Assay (FDA approved for NASAL specimens in patients 51 years of age and older), is one component of a comprehensive surveillance program. It is not intended to diagnose infection nor to guide or monitor treatment.          Radiology Studies: Dg Chest 2 View  Result Date: 08/12/2017 CLINICAL DATA:  History of breast carcinoma. Nausea and vomiting. Fever. EXAM: CHEST  2 VIEW COMPARISON:  Nov 28, 2015 FINDINGS: There is no edema or consolidation. Heart is mildly enlarged with pulmonary vascularity within normal limits. There is aortic atherosclerosis. No adenopathy. No blastic or lytic bone lesions. There is degenerative change in the thoracic spine. IMPRESSION: No edema or consolidation. Heart mildly enlarged. There is aortic atherosclerosis. Aortic Atherosclerosis (  ICD10-I70.0). Electronically Signed   By: Lowella Grip III M.D.   On: 08/12/2017 08:40   Dg Cholangiogram Operative  Result Date: 08/13/2017 CLINICAL DATA:  84 year old female with a history of cholecystitis EXAM: INTRAOPERATIVE CHOLANGIOGRAM TECHNIQUE: Cholangiographic images from the C-arm fluoroscopic device were submitted for interpretation post-operatively. Please see the procedural report for the amount of contrast and the fluoroscopy time utilized. COMPARISON:  Ultrasound 08/12/2017 FINDINGS: Surgical instruments project over the upper abdomen. There is cannulation of the cystic duct/gallbladder neck, with antegrade infusion of contrast. Caliber of the extrahepatic ductal system borderline enlarged, though not unusual for a patient of this age. No definite filling defect within the extrahepatic ducts identified. Free flow of  contrast across the ampulla. IMPRESSION: Intraoperative cholangiogram demonstrates mild dilation of the extrahepatic biliary ducts, with no definite filling defects identified. Free flow of contrast across the ampulla. Please refer to the dictated operative report for full details of intraoperative findings and procedure Electronically Signed   By: Corrie Mckusick D.O.   On: 08/13/2017 15:07   US Abdomen Limited Ruq  Result Date: 08/12/2017 CLINICAL DATA:  Elevated liver enzymes with nausea and vomiting EXAM: ULTRASOUND ABDOMEN LIMITED RIGHT UPPER QUADRANT COMPARISON:  None. FINDINGS: Gallbladder: Within the gallbladder, there are echogenic foci which move and shadow consistent with cholelithiasis. Largest individual gallstone measures 9 mm in length. Gallbladder wall is mildly thickened with subtle edema. No pericholecystic fluid. No sonographic Murphy sign noted by sonographer. Common bile duct: Diameter: 8 mm, mildly prominent. No biliary duct mass or calculus evident. Liver: No focal lesion identified. Within normal limits in parenchymal echogenicity. Portal vein is patent on color Doppler imaging with normal direction of blood flow towards the liver. IMPRESSION: Cholelithiasis with mild gallbladder wall thickening. Gallbladder wall appears subtly edematous. These are findings concerning for early acute cholecystitis. It may be prudent in this regard to consider nuclear medicine hepatobiliary imaging study to assess for cystic duct patency. Mild prominence of the common bile duct without mass or calculus evident. Electronically Signed   By: Lowella Grip III M.D.   On: 08/12/2017 08:13        Scheduled Meds: . acetaminophen  500 mg Oral TID  . fluticasone  2 spray Each Nare Daily  . pantoprazole (PROTONIX) IV  40 mg Intravenous QHS  . potassium chloride SA  40 mEq Oral Once   Continuous Infusions: . dextrose 5% lactated ringers with KCl 20 mEq/L 50 mL/hr at 08/13/17 1816  . magnesium sulfate  1 - 4 g bolus IVPB       LOS: 0 days    Time spent: 35 minutes    Irine Seal, MD [facility] Pager [phone]  If 7PM-7AM, please contact night-coverage [email or web address] Password [facility] 08/13/2017, 6:39 PM

## 2017-08-14 ENCOUNTER — Encounter (HOSPITAL_COMMUNITY): Payer: Self-pay | Admitting: General Surgery

## 2017-08-14 DIAGNOSIS — Z853 Personal history of malignant neoplasm of breast: Secondary | ICD-10-CM | POA: Diagnosis not present

## 2017-08-14 DIAGNOSIS — I119 Hypertensive heart disease without heart failure: Secondary | ICD-10-CM | POA: Diagnosis not present

## 2017-08-14 DIAGNOSIS — I10 Essential (primary) hypertension: Secondary | ICD-10-CM | POA: Diagnosis not present

## 2017-08-14 DIAGNOSIS — K8012 Calculus of gallbladder with acute and chronic cholecystitis without obstruction: Secondary | ICD-10-CM | POA: Diagnosis not present

## 2017-08-14 DIAGNOSIS — J302 Other seasonal allergic rhinitis: Secondary | ICD-10-CM | POA: Diagnosis not present

## 2017-08-14 DIAGNOSIS — M810 Age-related osteoporosis without current pathological fracture: Secondary | ICD-10-CM | POA: Diagnosis present

## 2017-08-14 DIAGNOSIS — Z79899 Other long term (current) drug therapy: Secondary | ICD-10-CM | POA: Diagnosis not present

## 2017-08-14 DIAGNOSIS — I872 Venous insufficiency (chronic) (peripheral): Secondary | ICD-10-CM | POA: Diagnosis not present

## 2017-08-14 DIAGNOSIS — D649 Anemia, unspecified: Secondary | ICD-10-CM | POA: Diagnosis not present

## 2017-08-14 DIAGNOSIS — M1990 Unspecified osteoarthritis, unspecified site: Secondary | ICD-10-CM | POA: Diagnosis not present

## 2017-08-14 DIAGNOSIS — Z9849 Cataract extraction status, unspecified eye: Secondary | ICD-10-CM | POA: Diagnosis not present

## 2017-08-14 DIAGNOSIS — Z974 Presence of external hearing-aid: Secondary | ICD-10-CM | POA: Diagnosis not present

## 2017-08-14 DIAGNOSIS — I519 Heart disease, unspecified: Secondary | ICD-10-CM | POA: Diagnosis not present

## 2017-08-14 DIAGNOSIS — K219 Gastro-esophageal reflux disease without esophagitis: Secondary | ICD-10-CM | POA: Diagnosis not present

## 2017-08-14 DIAGNOSIS — I5189 Other ill-defined heart diseases: Secondary | ICD-10-CM | POA: Diagnosis not present

## 2017-08-14 DIAGNOSIS — R000 Tachycardia, unspecified: Secondary | ICD-10-CM | POA: Diagnosis not present

## 2017-08-14 DIAGNOSIS — H9190 Unspecified hearing loss, unspecified ear: Secondary | ICD-10-CM | POA: Diagnosis present

## 2017-08-14 DIAGNOSIS — Z85828 Personal history of other malignant neoplasm of skin: Secondary | ICD-10-CM | POA: Diagnosis not present

## 2017-08-14 DIAGNOSIS — Z923 Personal history of irradiation: Secondary | ICD-10-CM | POA: Diagnosis not present

## 2017-08-14 DIAGNOSIS — Z8041 Family history of malignant neoplasm of ovary: Secondary | ICD-10-CM | POA: Diagnosis not present

## 2017-08-14 DIAGNOSIS — Z888 Allergy status to other drugs, medicaments and biological substances status: Secondary | ICD-10-CM | POA: Diagnosis not present

## 2017-08-14 DIAGNOSIS — K812 Acute cholecystitis with chronic cholecystitis: Secondary | ICD-10-CM | POA: Diagnosis not present

## 2017-08-14 DIAGNOSIS — R945 Abnormal results of liver function studies: Secondary | ICD-10-CM | POA: Diagnosis not present

## 2017-08-14 DIAGNOSIS — N179 Acute kidney failure, unspecified: Secondary | ICD-10-CM | POA: Diagnosis not present

## 2017-08-14 DIAGNOSIS — Z803 Family history of malignant neoplasm of breast: Secondary | ICD-10-CM | POA: Diagnosis not present

## 2017-08-14 DIAGNOSIS — I739 Peripheral vascular disease, unspecified: Secondary | ICD-10-CM | POA: Diagnosis not present

## 2017-08-14 LAB — FERRITIN: Ferritin: 101 ng/mL (ref 11–307)

## 2017-08-14 LAB — HEPATIC FUNCTION PANEL
ALBUMIN: 3 g/dL — AB (ref 3.5–5.0)
ALK PHOS: 183 U/L — AB (ref 38–126)
ALT: 263 U/L — AB (ref 14–54)
AST: 137 U/L — ABNORMAL HIGH (ref 15–41)
Bilirubin, Direct: 0.3 mg/dL (ref 0.1–0.5)
Indirect Bilirubin: 0.2 mg/dL — ABNORMAL LOW (ref 0.3–0.9)
TOTAL PROTEIN: 5.5 g/dL — AB (ref 6.5–8.1)
Total Bilirubin: 0.5 mg/dL (ref 0.3–1.2)

## 2017-08-14 LAB — RETICULOCYTES
RBC.: 3.99 MIL/uL (ref 3.87–5.11)
RETIC COUNT ABSOLUTE: 55.9 10*3/uL (ref 19.0–186.0)
Retic Ct Pct: 1.4 % (ref 0.4–3.1)

## 2017-08-14 LAB — BASIC METABOLIC PANEL
Anion gap: 7 (ref 5–15)
BUN: 19 mg/dL (ref 6–20)
CHLORIDE: 108 mmol/L (ref 101–111)
CO2: 22 mmol/L (ref 22–32)
Calcium: 8.5 mg/dL — ABNORMAL LOW (ref 8.9–10.3)
Creatinine, Ser: 1.12 mg/dL — ABNORMAL HIGH (ref 0.44–1.00)
GFR calc Af Amer: 48 mL/min — ABNORMAL LOW (ref >60)
GFR calc non Af Amer: 41 mL/min — ABNORMAL LOW (ref >60)
Glucose, Bld: 170 mg/dL — ABNORMAL HIGH (ref 65–99)
POTASSIUM: 4 mmol/L (ref 3.5–5.1)
SODIUM: 137 mmol/L (ref 135–145)

## 2017-08-14 LAB — CBC
HCT: 33.7 % — ABNORMAL LOW (ref 36.0–46.0)
HEMOGLOBIN: 11.1 g/dL — AB (ref 12.0–15.0)
MCH: 29.8 pg (ref 26.0–34.0)
MCHC: 32.9 g/dL (ref 30.0–36.0)
MCV: 90.6 fL (ref 78.0–100.0)
Platelets: 131 10*3/uL — ABNORMAL LOW (ref 150–400)
RBC: 3.72 MIL/uL — AB (ref 3.87–5.11)
RDW: 13.8 % (ref 11.5–15.5)
WBC: 7.4 10*3/uL (ref 4.0–10.5)

## 2017-08-14 LAB — IRON AND TIBC
IRON: 16 ug/dL — AB (ref 28–170)
Saturation Ratios: 6 % — ABNORMAL LOW (ref 10.4–31.8)
TIBC: 266 ug/dL (ref 250–450)
UIBC: 250 ug/dL

## 2017-08-14 LAB — MAGNESIUM: MAGNESIUM: 1.7 mg/dL (ref 1.7–2.4)

## 2017-08-14 LAB — VITAMIN B12: Vitamin B-12: 272 pg/mL (ref 180–914)

## 2017-08-14 LAB — FOLATE: FOLATE: 5.8 ng/mL — AB (ref >5.9)

## 2017-08-14 MED ORDER — MAGNESIUM SULFATE 4 GM/100ML IV SOLN
4.0000 g | Freq: Once | INTRAVENOUS | Status: AC
  Administered 2017-08-14: 4 g via INTRAVENOUS
  Filled 2017-08-14: qty 100

## 2017-08-14 NOTE — Progress Notes (Signed)
Patient ID: [name], female   DOB: [birth year], 84 y.o.   MRN: [id number] 1 Day Post-Op   Subjective: No specific complaints.  Has not been up much yet.  Just sore.  Denies nausea.  Objective: Vital signs in last 24 hours: Temp:  [98.1 F (36.7 C)-99.1 F (37.3 C)] 98.1 F (36.7 C) (01/27 0545) Pulse Rate:  [76-88] 82 (01/27 0545) Resp:  [18-23] 18 (01/27 0545) BP: (154-177)/(64-82) 154/71 (01/27 0545) SpO2:  [97 %-100 %] 98 % (01/27 0545) Last BM Date: 08/13/17  Intake/Output from previous day: 01/26 0701 - 01/27 0700 In: 1270 [P.O.:320; I.V.:950] Out: 460 [Urine:450; Blood:10] Intake/Output this shift: No intake/output data recorded.  General appearance: alert, cooperative and no distress Resp: No wheezing or increased work of breathing GI: normal findings: soft, non-tender Incision/Wound: No erythema or drainage  Lab Results:  Recent Labs    08/13/17 0405 08/14/17 0449  WBC 7.7 7.4  HGB 10.4* 11.1*  HCT 31.3* 33.7*  PLT 130* 131*   BMET Recent Labs    08/12/17 0605 08/13/17 0405  NA 139 134*  K 3.7 3.5  CL 105 105  CO2 24 23  GLUCOSE 158* 108*  BUN 27* 19  CREATININE 1.15* 1.11*  CALCIUM 9.4 7.9*   Hepatic Function Latest Ref Rng & Units 08/14/2017 08/13/2017 08/12/2017  Total Protein 6.5 - 8.1 g/dL 5.5(L) 5.6(L) 6.7  Albumin 3.5 - 5.0 g/dL 3.0(L) 3.1(L) 3.8  AST 15 - 41 U/L 137(H) 341(H) 862(H)  ALT 14 - 54 U/L 263(H) 386(H) 417(H)  Alk Phosphatase 38 - 126 U/L 183(H) 169(H) 196(H)  Total Bilirubin 0.3 - 1.2 mg/dL 0.5 1.3(H) 1.5(H)  Bilirubin, Direct 0.1 - 0.5 mg/dL 0.3 - -    Studies/Results: Dg Cholangiogram Operative  Result Date: 08/13/2017 CLINICAL DATA:  83 year old female with a history of cholecystitis EXAM: INTRAOPERATIVE CHOLANGIOGRAM TECHNIQUE: Cholangiographic images from the C-arm fluoroscopic device were submitted for interpretation post-operatively. Please see the procedural report for the amount of contrast and the fluoroscopy  time utilized. COMPARISON:  Ultrasound 08/12/2017 FINDINGS: Surgical instruments project over the upper abdomen. There is cannulation of the cystic duct/gallbladder neck, with antegrade infusion of contrast. Caliber of the extrahepatic ductal system borderline enlarged, though not unusual for a patient of this age. No definite filling defect within the extrahepatic ducts identified. Free flow of contrast across the ampulla. IMPRESSION: Intraoperative cholangiogram demonstrates mild dilation of the extrahepatic biliary ducts, with no definite filling defects identified. Free flow of contrast across the ampulla. Please refer to the dictated operative report for full details of intraoperative findings and procedure Electronically Signed   By: Corrie Mckusick D.O.   On: 08/13/2017 15:07    Anti-infectives: Anti-infectives (From admission, onward)   Start     Dose/Rate Route Frequency Ordered Stop   08/13/17 0600  cefTRIAXone (ROCEPHIN) 2 g in dextrose 5 % 50 mL IVPB  Status:  Discontinued     2 g 100 mL/hr over 30 Minutes Intravenous Every 24 hours 08/12/17 1039 08/13/17 1651   08/12/17 0715  cefTRIAXone (ROCEPHIN) 1 g in dextrose 5 % 50 mL IVPB  Status:  Discontinued     1 g 100 mL/hr over 30 Minutes Intravenous  Once 08/12/17 0700 08/12/17 0701   08/12/17 0715  cefTRIAXone (ROCEPHIN) 2 g in dextrose 5 % 50 mL IVPB     2 g 100 mL/hr over 30 Minutes Intravenous  Once 08/12/17 0701 08/12/17 0815      Assessment/Plan: s/p Procedure(s): LAPAROSCOPIC CHOLECYSTECTOMY  WITH NORMAL INTRAOPERATIVE CHOLANGIOGRAM Doing well postoperatively.  LFTs continued to trend down. Does not quite feel strong enough to cope at home yet.  Will observe today.  Repeat LFTs tomorrow.  Expect discharge tomorrow morning.    LOS: 0 days    Edward Jolly 08/14/2017

## 2017-08-14 NOTE — Progress Notes (Signed)
CONSULT/PROGRESS NOTE    [name]  PYK:[id number] DOB: 02/07/1922 DOA: 08/12/2017 PCP: Marletta Lor, MD   Brief Narrative:  Patient is a 84 year old highly functional female with past medical history of tinnitus, history of breast cancer, chronic venous insufficiency, diet-controlled hypertension, gastroesophageal reflux disease presented to the ED with abdominal pain nausea and vomiting.  Abdominal ultrasound which was done was worrisome for an acute cholecystitis.  Patient was admitted by the general surgical service with plans for laparoscopic cholecystectomy on 08/13/2017.  [facility] hospitalist were consulted for preop optimization and assistance with chronic medical management.  Patient noted to be low risk for surgical intervention.  2D echo which was obtained with a EF of 55-60%, no wall motion abnormalities, grade 2 diastolic dysfunction, very mild aortic valvular stenosis.  Assessment & Plan:   Principal Problem:   Acute on chronic cholecystitis Active Problems:   Osteoarthritis   Grade II diastolic dysfunction   HTN (hypertension)   Anemia   GERD (gastroesophageal reflux disease)  #1 acute on chronic cholecystitis .  Abdominal ultrasound.  LFTs trending down.  Patient status post laparoscopic cholecystectomy with intraoperative cholangiogram.  LFTs trending down.  GI following.  Saline lock IV fluids.  Per primary team.  2.  Hypertension Patient noted to have systolic blood pressures in the 160s-170s postoperatively.  Patient complained of abdominal pain which is likely contributing to elevated blood pressure.  Patient currently on IV metoprolol as needed.  Blood pressure improved with better control of pain.  Continue scheduled Tylenol 500 mg 3 times daily.  Patient receiving pain medications per primary service.  Outpatient follow-up with PCP.  3.  Gastroesophageal reflux disease Continue PPI.  4.  Chronic venous insufficiency TED hose was ordered.  Diuretics on  hold.  Resume diuretics on discharge.  5.  Allergic rhinitis Continue Flonase.  6.  Anemia Likely anemia of chronic disease.  Hemoglobin stable at 11.1.  Outpatient follow-up.    7.  Grade 2 diastolic dysfunction Currently compensated.  2D echo with EF of 55-60%, no wall motion abnormalities, grade 2 diastolic dysfunction, mild aortic valvular stenosis.  Monitor volume status closely.  Diuretics on hold.  Saline lock IV fluid once tolerating oral intake.  Resume diuretics on discharge.   DVT prophylaxis: SCDs Code Status: Full Family Communication: Updated patient.  No family at bedside. Disposition Plan: Per primary team.   Consultants:   [facility] hospitalist: Dr. Dyann Kief 08/12/2017  Gastroenterology: Dr. Paulita Fujita 08/12/2017  Procedures:   Chest x-ray 08/12/2017  Right upper quadrant ultrasound 08/12/2017  Laparoscopic cholecystectomy with intraoperative cholangiogram per Dr. Excell Seltzer 08/13/2017  Antimicrobials:   None   Subjective: Patient laying in bed.  Denies any nausea or vomiting.  States abdominal pain improved.  No chest pain.  No shortness of breath.    Objective: Vitals:   08/13/17 1854 08/13/17 1958 08/13/17 2200 08/14/17 0545  BP: (!) 175/64 (!) 174/71 (!) 167/68 (!) 154/71  Pulse: 82 76 78 82  Resp: 20 20 20 18   Temp: 98.3 F (36.8 C) 98.3 F (36.8 C) 99.1 F (37.3 C) 98.1 F (36.7 C)  TempSrc: Oral Oral Oral Oral  SpO2: 98% 97% 98% 98%  Weight:      Height:        Intake/Output Summary (Last 24 hours) at 08/14/2017 1201 Last data filed at 08/14/2017 1001 Gross per 24 hour  Intake 1063.33 ml  Output 210 ml  Net 853.33 ml   Filed Weights   08/12/17 0601  Weight: 77.1  kg (170 lb)    Examination:  General exam: NAD Respiratory system: Clear to auscultation bilaterally anterior lung fields.  No wheezes, no crackles, no rhonchi.   Cardiovascular system: Regular rate and rhythm no murmurs rubs or gallops.  No JVD.  No pedal edema.     Gastrointestinal system: Abdomen is soft, nondistended, some discomfort to palpation, positive bowel sounds.  No hepatosplenomegaly.  Central nervous system: Alert and oriented. No focal neurological deficits. Extremities: Symmetric 5 x 5 power. Skin: No rashes, lesions or ulcers Psychiatry: Judgement and insight appear normal. Mood & affect appropriate.     Data Reviewed: I have personally reviewed following labs and imaging studies  CBC: Recent Labs  Lab 08/12/17 0605 08/13/17 0405 08/14/17 0449  WBC 9.5 7.7 7.4  NEUTROABS 9.2*  --   --   HGB 12.1 10.4* 11.1*  HCT 36.5 31.3* 33.7*  MCV 90.1 91.5 90.6  PLT 161 130* 914*   Basic Metabolic Panel: Recent Labs  Lab 08/12/17 0605 08/13/17 0405 08/13/17 0852 08/14/17 0449 08/14/17 0919  NA 139 134*  --   --  137  K 3.7 3.5  --   --  4.0  CL 105 105  --   --  108  CO2 24 23  --   --  22  GLUCOSE 158* 108*  --   --  170*  BUN 27* 19  --   --  19  CREATININE 1.15* 1.11*  --   --  1.12*  CALCIUM 9.4 7.9*  --   --  8.5*  MG  --   --  1.7 1.7  --    GFR: Estimated Creatinine Clearance: 30.9 mL/min (A) (by C-G formula based on SCr of 1.12 mg/dL (H)). Liver Function Tests: Recent Labs  Lab 08/12/17 0605 08/13/17 0405 08/14/17 0449  AST 862* 341* 137*  ALT 417* 386* 263*  ALKPHOS 196* 169* 183*  BILITOT 1.5* 1.3* 0.5  PROT 6.7 5.6* 5.5*  ALBUMIN 3.8 3.1* 3.0*   Recent Labs  Lab 08/12/17 0605  LIPASE 33   No results for input(s): AMMONIA in the last 168 hours. Coagulation Profile: No results for input(s): INR, PROTIME in the last 168 hours. Cardiac Enzymes: No results for input(s): CKTOTAL, CKMB, CKMBINDEX, TROPONINI in the last 168 hours. BNP (last 3 results) No results for input(s): PROBNP in the last 8760 hours. HbA1C: No results for input(s): HGBA1C in the last 72 hours. CBG: No results for input(s): GLUCAP in the last 168 hours. Lipid Profile: No results for input(s): CHOL, HDL, LDLCALC, TRIG, CHOLHDL,  LDLDIRECT in the last 72 hours. Thyroid Function Tests: No results for input(s): TSH, T4TOTAL, FREET4, T3FREE, THYROIDAB in the last 72 hours. Anemia Panel: Recent Labs    08/14/17 0919  RETICCTPCT 1.4   Sepsis Labs: Recent Labs  Lab 08/12/17 7829 08/12/17 0845  LATICACIDVEN 2.92* 1.27    Recent Results (from the past 240 hour(s))  Surgical pcr screen     Status: None   Collection Time: 08/13/17 12:00 PM  Result Value Ref Range Status   MRSA, PCR NEGATIVE NEGATIVE Final   Staphylococcus aureus NEGATIVE NEGATIVE Final    Comment: (NOTE) The Xpert SA Assay (FDA approved for NASAL specimens in patients 72 years of age and older), is one component of a comprehensive surveillance program. It is not intended to diagnose infection nor to guide or monitor treatment.          Radiology Studies: Dg Cholangiogram Operative  Result Date:  08/13/2017 CLINICAL DATA:  84 year old female with a history of cholecystitis EXAM: INTRAOPERATIVE CHOLANGIOGRAM TECHNIQUE: Cholangiographic images from the C-arm fluoroscopic device were submitted for interpretation post-operatively. Please see the procedural report for the amount of contrast and the fluoroscopy time utilized. COMPARISON:  Ultrasound 08/12/2017 FINDINGS: Surgical instruments project over the upper abdomen. There is cannulation of the cystic duct/gallbladder neck, with antegrade infusion of contrast. Caliber of the extrahepatic ductal system borderline enlarged, though not unusual for a patient of this age. No definite filling defect within the extrahepatic ducts identified. Free flow of contrast across the ampulla. IMPRESSION: Intraoperative cholangiogram demonstrates mild dilation of the extrahepatic biliary ducts, with no definite filling defects identified. Free flow of contrast across the ampulla. Please refer to the dictated operative report for full details of intraoperative findings and procedure Electronically Signed   By: Corrie Mckusick D.O.   On: 08/13/2017 15:07        Scheduled Meds: . acetaminophen  500 mg Oral TID  . fluticasone  2 spray Each Nare Daily  . pantoprazole (PROTONIX) IV  40 mg Intravenous QHS   Continuous Infusions: . dextrose 5% lactated ringers with KCl 20 mEq/L 50 mL/hr at 08/14/17 0600  . magnesium sulfate 1 - 4 g bolus IVPB       LOS: 0 days    Time spent: 35 minutes    Irine Seal, MD [facility] Pager [phone]  If 7PM-7AM, please contact night-coverage [email or web address] Password [facility] 08/14/2017, 12:01 PM

## 2017-08-15 LAB — BASIC METABOLIC PANEL
ANION GAP: 4 — AB (ref 5–15)
BUN: 19 mg/dL (ref 6–20)
CO2: 25 mmol/L (ref 22–32)
Calcium: 7.9 mg/dL — ABNORMAL LOW (ref 8.9–10.3)
Chloride: 110 mmol/L (ref 101–111)
Creatinine, Ser: 1.04 mg/dL — ABNORMAL HIGH (ref 0.44–1.00)
GFR calc non Af Amer: 45 mL/min — ABNORMAL LOW (ref >60)
GFR, EST AFRICAN AMERICAN: 52 mL/min — AB (ref >60)
GLUCOSE: 136 mg/dL — AB (ref 65–99)
POTASSIUM: 4 mmol/L (ref 3.5–5.1)
Sodium: 139 mmol/L (ref 135–145)

## 2017-08-15 LAB — HEPATIC FUNCTION PANEL
ALBUMIN: 2.9 g/dL — AB (ref 3.5–5.0)
ALT: 196 U/L — AB (ref 14–54)
AST: 79 U/L — AB (ref 15–41)
Alkaline Phosphatase: 188 U/L — ABNORMAL HIGH (ref 38–126)
BILIRUBIN DIRECT: 0.3 mg/dL (ref 0.1–0.5)
Indirect Bilirubin: 0.4 mg/dL (ref 0.3–0.9)
Total Bilirubin: 0.7 mg/dL (ref 0.3–1.2)
Total Protein: 5.4 g/dL — ABNORMAL LOW (ref 6.5–8.1)

## 2017-08-15 NOTE — Progress Notes (Signed)
Discharge instructions given. Pt verbalized understanding and all questions were answered.  

## 2017-08-15 NOTE — Discharge Instructions (Signed)

## 2017-08-15 NOTE — Progress Notes (Signed)
Patient is from [place] living with spouse at [place] met with patietn at bedside, no CSW identified.   Kathrin Greathouse, Latanya Presser, MSW Clinical Social Worker  [phone] 08/15/2017  12:36 PM

## 2017-08-15 NOTE — Discharge Summary (Signed)
[facility] Discharge Summary   Patient ID: [name] MRN: [id number] DOB/AGE: [birth year] 84 y.o.  Admit date: 08/12/2017 Discharge date: 08/15/2017  Admitting Diagnosis: Acute cholecystitis  Discharge Diagnosis Acute cholecystitis  Consultants Internal medicine  Gastroenterology  Imaging: Dg Cholangiogram Operative  Result Date: 08/13/2017 CLINICAL DATA:  84 year old female with a history of cholecystitis EXAM: INTRAOPERATIVE CHOLANGIOGRAM TECHNIQUE: Cholangiographic images from the C-arm fluoroscopic device were submitted for interpretation post-operatively. Please see the procedural report for the amount of contrast and the fluoroscopy time utilized. COMPARISON:  Ultrasound 08/12/2017 FINDINGS: Surgical instruments project over the upper abdomen. There is cannulation of the cystic duct/gallbladder neck, with antegrade infusion of contrast. Caliber of the extrahepatic ductal system borderline enlarged, though not unusual for a patient of this age. No definite filling defect within the extrahepatic ducts identified. Free flow of contrast across the ampulla. IMPRESSION: Intraoperative cholangiogram demonstrates mild dilation of the extrahepatic biliary ducts, with no definite filling defects identified. Free flow of contrast across the ampulla. Please refer to the dictated operative report for full details of intraoperative findings and procedure Electronically Signed   By: Corrie Mckusick D.O.   On: 08/13/2017 15:07    Procedures Dr. Excell Seltzer (08/13/17) - Laparoscopic Cholecystectomy with [place] Hospital Course:  Patient is a 84 y/o female who presented to [facility] with abdominal pain, nausea and vomiting.  Workup showed acute cholecystitis with some concern for choledocholithiasis. GI was consulted and recommended trending LFTs, but proceeding with cholecystectomy.  Internal medicine consulted for assistance and recommendations regarding other health problems and advanced age.  Patient was admitted and underwent procedure listed above.  Tolerated procedure well and was transferred to the floor.  Diet was advanced as tolerated.  On POD#2, the patient was voiding well, tolerating diet, ambulating well, pain well controlled, vital signs stable, incisions c/d/i and felt stable for discharge home.  Patient will follow up in our office in 2 weeks and knows to call with questions or concerns. She will call to confirm appointment date/time.    Physical Exam: General:  Alert, NAD, pleasant, comfortable Abd:  Soft, ND, mild tenderness, incisions C/D/I  Allergies as of 08/15/2017      Reactions   Celecoxib    REACTION: Swelling-leg      Medication List    TAKE these medications   azelastine 0.1 % nasal spray Commonly known as:  ASTELIN Place 2 sprays into both nostrils 2 (two) times daily. Use in each nostril as directed   CALCIUM 600 + D PO Take 1 tablet by mouth daily.   cycloSPORINE 0.05 % ophthalmic emulsion Commonly known as:  RESTASIS Place 1 drop into both eyes 2 (two) times daily as needed (dry eyes.). Reported on 08/07/2015   fexofenadine 180 MG tablet Commonly known as:  ALLEGRA Take 1 tablet (180 mg total) by mouth daily.   fluticasone 50 MCG/ACT nasal spray Commonly known as:  FLONASE Place 2 sprays into both nostrils daily.   furosemide 40 MG tablet Commonly known as:  LASIX Take 1 tablet (40 mg total) by mouth daily. What changed:  additional instructions   ketoconazole 2 % shampoo Commonly known as:  NIZORAL Apply 1 application topically every 7 (seven) days.   mometasone 50 MCG/ACT nasal spray Commonly known as:  NASONEX Place 1 spray into the nose daily.   nystatin-triamcinolone cream Commonly known as:  MYCOLOG II Apply 1 application topically 2 (two) times daily.   pantoprazole 20 MG tablet Commonly known as:  PROTONIX Take  40 mg by mouth daily.   PRESERVISION AREDS 2 Caps Take 1 capsule by mouth 2 (two) times daily.    triamcinolone cream 0.1 % Commonly known as:  KENALOG Apply 1 application topically daily as needed (dry skin). Reported on 07/18/2015        Follow-up Information    [facility], [place]. Go on 08/30/2017.   Specialty:  General Surgery Why:  Your appointment is at  8:45 AM. Please arrive 30 min prior to appointment time. Bring photo ID and insurance information.   Contact information: [place] [address] [phone]        Marletta Lor, MD. Call.   Specialty:  Internal Medicine Why:  Call and schedule an appointment in 1-2 weeks. Contact information: [place] [address] [phone]           Signed: Brigid Re, [facility] 08/15/2017, 10:31 AM Pager: [phone] Consults: [phone] Mon-Fri 7:00 am-4:30 pm Sat-Sun 7:00 am-11:30 am

## 2017-08-16 ENCOUNTER — Telehealth: Payer: Self-pay | Admitting: Internal Medicine

## 2017-08-16 NOTE — Telephone Encounter (Signed)
Transition Care Management Follow-up Telephone Call  Per Discharge Summary: Admit date: 08/12/2017 Discharge date: 08/15/2017  Admitting Diagnosis: Acute cholecystitis  Discharge Diagnosis Acute cholecystitis  Consultants Internal medicine  Gastroenterology  --   How have you been since you were released from the hospital? "I've been a little bit on the weak side but I'm gaining strength."   Do you understand why you were in the hospital? yes   Do you understand the discharge instructions? yes   Where were you discharged to? Home   Items Reviewed:  Medications reviewed: yes  Allergies reviewed: yes  Dietary changes reviewed: yes  Referrals reviewed: yes   Functional Questionnaire:   Activities of Daily Living (ADLs):   She states they are independent in the following: feeding and dressing States they require assistance with the following: ambulation, bathing and hygiene, continence and toileting. Friends are helping with ADLs right now.   Any transportation issues/concerns?: no, husband will drive her to appointments   Any patient concerns? yes, patient requesting PT, see note below   Confirmed importance and date/time of follow-up visits scheduled yes  Provider Appointment booked with Dr. Carolann Littler 08/17/17 @ 3:30pm (PCP out of office for the next 2 weeks)  Confirmed with patient if condition begins to worsen call PCP or go to the ER.  Patient was given the office number and encouraged to call back with question or concerns.  : yes

## 2017-08-16 NOTE — Telephone Encounter (Signed)
Copied from [place] [id number]. Topic: General - Other >> Aug 16, 2017 10:20 AM Neva Seat wrote: [phone] - Chrissie Noa - family friend - pt gave permission to speak with her on the phone.  Pt was discharged from surgery with no orders on how to be independent.  Requesting/ Needing PT for pt to gain back her independence.  Please call Sherrie back to discuss care.

## 2017-08-17 ENCOUNTER — Inpatient Hospital Stay: Payer: MEDICARE | Admitting: Family Medicine

## 2017-08-18 DIAGNOSIS — Z9049 Acquired absence of other specified parts of digestive tract: Secondary | ICD-10-CM | POA: Diagnosis not present

## 2017-08-18 DIAGNOSIS — Z48815 Encounter for surgical aftercare following surgery on the digestive system: Secondary | ICD-10-CM | POA: Diagnosis not present

## 2017-08-19 ENCOUNTER — Encounter: Payer: Self-pay | Admitting: Family Medicine

## 2017-08-19 ENCOUNTER — Ambulatory Visit (INDEPENDENT_AMBULATORY_CARE_PROVIDER_SITE_OTHER): Payer: MEDICARE | Admitting: Family Medicine

## 2017-08-19 VITALS — BP 128/62 | HR 90 | Temp 97.4°F | Ht 63.0 in | Wt 171.2 lb

## 2017-08-19 DIAGNOSIS — I872 Venous insufficiency (chronic) (peripheral): Secondary | ICD-10-CM

## 2017-08-19 DIAGNOSIS — R7401 Elevation of levels of liver transaminase levels: Secondary | ICD-10-CM

## 2017-08-19 DIAGNOSIS — K219 Gastro-esophageal reflux disease without esophagitis: Secondary | ICD-10-CM

## 2017-08-19 DIAGNOSIS — R740 Nonspecific elevation of levels of transaminase and lactic acid dehydrogenase [LDH]: Secondary | ICD-10-CM

## 2017-08-19 DIAGNOSIS — D649 Anemia, unspecified: Secondary | ICD-10-CM

## 2017-08-19 NOTE — Patient Instructions (Signed)
Follow up for any fever, abdominal pain, vomiting, or other concerns.

## 2017-08-19 NOTE — Progress Notes (Signed)
Subjective:     Patient ID: [name], female   DOB: 09-19-1921, 84 y.o.   MRN: [id number]  HPI Patient seen for hospital follow-up in absence of her primary who is on vacation. She has history of hypertension, venous insufficiency, GERD, osteoporosis. She was admitted on 08/12/17 with abdominal pain, nausea, and vomiting. Workup revealed acute cholecystitis. There was concern for choledocholithiasis. GI was consulted. Surgery was recommended which she had the day after admission. Her liver function tests were trending down. She tolerated surgery well.  She's done well since discharge. Denies any abdominal pain. No fever. Still has slightly poor appetite but keeping down fluids and food without difficulty  She has scheduled physical therapy for home starting next week. Ambulating with walker. Slightly low iron level. Discharge hemoglobin 11.1  Past Medical History:  Diagnosis Date  . Allergic rhinitis, seasonal   . Breast cancer of upper-outer quadrant of right female breast ([place]) 09/13/2014  . Chronic venous insufficiency   . Dermatophytosis of nail   . Edema leg   . Family history of malignant neoplasm of breast   . Family history of malignant neoplasm of ovary   . Goiter    right thyroid nodule   . HTN (hypertension) 08/13/2017  . HX: breast cancer    bilateral  . Menopausal syndrome   . Osteoarthritis   . Osteoporosis   . Squamous cell skin cancer    right lower leg  . Wears glasses   . Wears hearing aid    both ears   Past Surgical History:  Procedure Laterality Date  . bilateral lumpectomies for breast cancer  Aug. 2007  . BREAST LUMPECTOMY WITH RADIOACTIVE SEED LOCALIZATION Right 10/01/2014   Procedure: BREAST LUMPECTOMY WITH RADIOACTIVE SEED LOCALIZATION;  Surgeon: Erroll Luna, MD;  Location: [facility];  Service: General;  Laterality: Right;  . CATARACT EXTRACTION    . CHOLECYSTECTOMY N/A 08/13/2017   Procedure: LAPAROSCOPIC CHOLECYSTECTOMY WITH  INTRAOPERATIVE CHOLANGIOGRAM;  Surgeon: Excell Seltzer, MD;  Location: [facility];  Service: General;  Laterality: N/A;  . DILATION AND CURETTAGE OF UTERUS    . ESOPHAGOGASTRODUODENOSCOPY  07/26/2011   Procedure: ESOPHAGOGASTRODUODENOSCOPY (EGD);  Surgeon: Jeryl Columbia, MD;  Location: [facility];  Service: Endoscopy;  Laterality: N/A;  . history of lumbar compression fracture    . left eardum sx  1983  . left knee arthroscopic surgery    . no screening colonoscopy    . s/p bilateral lumpectomies    . SAVORY DILATION  07/26/2011   Procedure: SAVORY DILATION;  Surgeon: Jeryl Columbia, MD;  Location: [facility];  Service: Endoscopy;  Laterality: N/A;  . status post resection squamous cell cancer right lower leg    . TONSILLECTOMY    . UMBILICAL HERNIA REPAIR      reports that  has never smoked. she has never used smokeless tobacco. She reports that she does not drink alcohol or use drugs. family history includes Cancer in her other; Cancer (age of onset: 71) in her maternal aunt; Cancer (age of onset: 82) in her maternal aunt; Cancer (age of onset: 8) in her mother; Coronary artery disease in her other; Heart attack in her brother, brother, and father; Heart disease in her brother. Allergies  Allergen Reactions  . Celecoxib Swelling    REACTION: Swelling-leg     Review of Systems  Constitutional: Negative for chills and fever.  Respiratory: Negative for shortness of breath.   Cardiovascular: Negative for chest pain.  Gastrointestinal: Negative  for abdominal pain, nausea and vomiting.  Genitourinary: Negative for dysuria.  Skin: Negative for rash.  Neurological: Negative for dizziness and syncope.  Psychiatric/Behavioral: Negative for confusion.       Objective:   Physical Exam  Constitutional: She is oriented to person, place, and time. She appears well-developed and well-nourished.  Cardiovascular: Normal rate and regular rhythm.  Pulmonary/Chest: Effort normal and breath sounds  normal. No respiratory distress. She has no wheezes. She has no rales.  Abdominal: Soft. There is no tenderness.  Laparoscopy wounds are healing well. No signs of secondary infection.  Musculoskeletal:  Support hose on bilaterally  Neurological: She is alert and oriented to person, place, and time.       Assessment:     #1 status post recent cholecystectomy for acute cholecystitis. Doing well postoperatively  #2 mild anemia  #3 venous stasis edema  #4 GERD stable on Protonix      Plan:     -Recheck hepatic panel but suspect this will be still trending down -Follow-up immediately for any fever, recurrent abdominal pain, vomiting, or other concerns -Home physical therapy reportedly already set up  Eulas Post MD [facility]

## 2017-08-20 DIAGNOSIS — H6980 Other specified disorders of Eustachian tube, unspecified ear: Secondary | ICD-10-CM | POA: Diagnosis not present

## 2017-08-20 LAB — HEPATIC FUNCTION PANEL
AG Ratio: 1.5 (calc) (ref 1.0–2.5)
ALT: 78 U/L — ABNORMAL HIGH (ref 6–29)
AST: 27 U/L (ref 10–35)
Albumin: 3.5 g/dL — ABNORMAL LOW (ref 3.6–5.1)
Alkaline phosphatase (APISO): 199 U/L — ABNORMAL HIGH (ref 33–130)
BILIRUBIN DIRECT: 0.1 mg/dL (ref 0.0–0.2)
BILIRUBIN INDIRECT: 0.4 mg/dL (ref 0.2–1.2)
BILIRUBIN TOTAL: 0.5 mg/dL (ref 0.2–1.2)
GLOBULIN: 2.3 g/dL (ref 1.9–3.7)
Total Protein: 5.8 g/dL — ABNORMAL LOW (ref 6.1–8.1)

## 2017-08-23 ENCOUNTER — Ambulatory Visit: Payer: MEDICARE | Admitting: Podiatry

## 2017-08-23 ENCOUNTER — Ambulatory Visit: Payer: Self-pay

## 2017-08-23 ENCOUNTER — Ambulatory Visit (INDEPENDENT_AMBULATORY_CARE_PROVIDER_SITE_OTHER): Payer: MEDICARE | Admitting: Family Medicine

## 2017-08-23 ENCOUNTER — Encounter: Payer: Self-pay | Admitting: Family Medicine

## 2017-08-23 VITALS — BP 116/60 | HR 80 | Temp 97.8°F | Ht 63.0 in | Wt 171.8 lb

## 2017-08-23 DIAGNOSIS — T887XXA Unspecified adverse effect of drug or medicament, initial encounter: Secondary | ICD-10-CM | POA: Diagnosis not present

## 2017-08-23 DIAGNOSIS — H6980 Other specified disorders of Eustachian tube, unspecified ear: Secondary | ICD-10-CM

## 2017-08-23 DIAGNOSIS — R5383 Other fatigue: Secondary | ICD-10-CM

## 2017-08-23 DIAGNOSIS — Z9049 Acquired absence of other specified parts of digestive tract: Secondary | ICD-10-CM | POA: Diagnosis not present

## 2017-08-23 DIAGNOSIS — Z48815 Encounter for surgical aftercare following surgery on the digestive system: Secondary | ICD-10-CM | POA: Diagnosis not present

## 2017-08-23 NOTE — Progress Notes (Signed)
HPI:  Acute visit for concerns about a medication reaction: -Reports chronic allergic rhinitis and eustachian tube issues, and treated with prednisone -She feels the prednisone has made her feel "wiped out", she does not want to take it - never took it before per her report and feels made her feel bad -Right after taking her dose this morning, she felt very tired after walking several hundred feet - felt tired for a few hours, now feeling better -di PT this afternoon and felt ok -Denies any chest pain, fevers, shortness of breath, weakness, numbness, falls, syncope, presyncope, palpitations, swelling, ear pain, sinus pain, abdominal pain -Gallbladder surgery recently, repeat labs done a few days ago were improved -Appetite is good with normal fluid intake  ROS: See pertinent positives and negatives per HPI.  Past Medical History:  Diagnosis Date  . Allergic rhinitis, seasonal   . Breast cancer of upper-outer quadrant of right female breast ([place]) 09/13/2014  . Chronic venous insufficiency   . Dermatophytosis of nail   . Edema leg   . Family history of malignant neoplasm of breast   . Family history of malignant neoplasm of ovary   . Goiter    right thyroid nodule   . HTN (hypertension) 08/13/2017  . HX: breast cancer    bilateral  . Menopausal syndrome   . Osteoarthritis   . Osteoporosis   . Squamous cell skin cancer    right lower leg  . Wears glasses   . Wears hearing aid    both ears    Past Surgical History:  Procedure Laterality Date  . bilateral lumpectomies for breast cancer  Aug. 2007  . BREAST LUMPECTOMY WITH RADIOACTIVE SEED LOCALIZATION Right 10/01/2014   Procedure: BREAST LUMPECTOMY WITH RADIOACTIVE SEED LOCALIZATION;  Surgeon: Erroll Luna, MD;  Location: [facility];  Service: General;  Laterality: Right;  . CATARACT EXTRACTION    . CHOLECYSTECTOMY N/A 08/13/2017   Procedure: LAPAROSCOPIC CHOLECYSTECTOMY WITH INTRAOPERATIVE CHOLANGIOGRAM;   Surgeon: Excell Seltzer, MD;  Location: [facility];  Service: General;  Laterality: N/A;  . DILATION AND CURETTAGE OF UTERUS    . ESOPHAGOGASTRODUODENOSCOPY  07/26/2011   Procedure: ESOPHAGOGASTRODUODENOSCOPY (EGD);  Surgeon: Jeryl Columbia, MD;  Location: [facility];  Service: Endoscopy;  Laterality: N/A;  . history of lumbar compression fracture    . left eardum sx  1983  . left knee arthroscopic surgery    . no screening colonoscopy    . s/p bilateral lumpectomies    . SAVORY DILATION  07/26/2011   Procedure: SAVORY DILATION;  Surgeon: Jeryl Columbia, MD;  Location: [facility];  Service: Endoscopy;  Laterality: N/A;  . status post resection squamous cell cancer right lower leg    . TONSILLECTOMY    . UMBILICAL HERNIA REPAIR      Family History  Problem Relation Age of Onset  . Cancer Mother 27       breast  . Heart attack Father   . Heart attack Brother   . Heart attack Brother   . Cancer Maternal Aunt 55       breast  . Coronary artery disease Other   . Cancer Other        ovarian; daughter of mat aunt w/ BC in 67s  . Heart disease Brother   . Cancer Maternal Aunt 59       breast    Social History   Socioeconomic History  . Marital status: Married    Spouse name: None  .  Number of children: None  . Years of education: None  . Highest education level: None  Social Needs  . Financial resource strain: None  . Food insecurity - worry: None  . Food insecurity - inability: None  . Transportation needs - medical: None  . Transportation needs - non-medical: None  Occupational History  . None  Tobacco Use  . Smoking status: Never Smoker  . Smokeless tobacco: Never Used  Substance and Sexual Activity  . Alcohol use: No  . Drug use: No  . Sexual activity: None  Other Topics Concern  . None  Social History Narrative  . None     Current Outpatient Medications:  .  azelastine (ASTELIN) 0.1 % nasal spray, Place 2 sprays into both nostrils 2 (two) times daily. Use in  each nostril as directed (Patient taking differently: Place 2 sprays into both nostrils 2 (two) times daily as needed. Use in each nostril as directed), Disp: 30 mL, Rfl: 4 .  Calcium Carb-Cholecalciferol (CALCIUM 600 + D PO), Take 1 tablet by mouth daily. , Disp: , Rfl:  .  fluticasone (FLONASE) 50 MCG/ACT nasal spray, Place 2 sprays into both nostrils daily., Disp: 16 g, Rfl: 6 .  furosemide (LASIX) 40 MG tablet, Take 1 tablet (40 mg total) by mouth daily. (Patient taking differently: Take 40 mg by mouth daily. As needed), Disp: 90 tablet, Rfl: 3 .  ketoconazole (NIZORAL) 2 % shampoo, Apply 1 application topically every 7 (seven) days. , Disp: , Rfl:  .  loratadine (CLARITIN) 10 MG tablet, Take 10 mg by mouth daily., Disp: , Rfl:  .  mometasone (NASONEX) 50 MCG/ACT nasal spray, Place 1 spray into the nose daily., Disp: 17 g, Rfl: 2 .  Multiple Vitamins-Minerals (PRESERVISION AREDS 2) CAPS, Take 1 capsule by mouth 2 (two) times daily., Disp: , Rfl:  .  nystatin-triamcinolone (MYCOLOG II) cream, Apply 1 application topically 2 (two) times daily., Disp: 30 g, Rfl: 2 .  pantoprazole (PROTONIX) 20 MG tablet, Take 40 mg by mouth daily. , Disp: , Rfl:  .  triamcinolone (KENALOG) 0.1 % cream, Apply 1 application topically daily as needed (dry skin). Reported on 07/18/2015, Disp: , Rfl:   EXAM:  Vitals:   08/23/17 1631  BP: 116/60  Pulse: 90  Temp: 97.8 F (36.6 C)  SpO2: 98%    Body mass index is 30.43 kg/m.  GENERAL: vitals reviewed and listed above, alert, oriented, appears well hydrated and in no acute distress  HEENT: atraumatic, conjunttiva clear, no obvious abnormalities on inspection of external nose and ears, normal appearance of ear canals and TMs except for ropture TM L - reports this is chronic, clear nasal congestion, mild post oropharyngeal erythema with PND, no tonsillar edema or exudate, no sinus TTP  NECK: no obvious masses on inspection  LUNGS: clear to auscultation  bilaterally, no wheezes, rales or rhonchi, good air movement  CV: HRRR, tr peripheral edema - wearing compression socks  MS: moves all extremities without noticeable abnormality, gait normal with walker  PSYCH: pleasant and cooperative, no obvious depression or anxiety  ASSESSMENT AND PLAN:  Discussed the following assessment and plan:  Other fatigue  Medication side effect  Dysfunction of Eustachian tube, unspecified laterality  -we discussed possible serious and likely etiologies, workup and treatment, treatment risks and return precautions - possible medication side effect, recovering from GB surgery vs other -after this discussion, Ashanna opted for stopping the prednisone, observation, BP on low side but they will be monitoring  at PT daily -follow up advised as needed  -of course, we advised Lakie  to return or notify a doctor immediately if symptoms worsen or persist or new concerns arise. Advise emergent evaluation if CP, heart concerns, SOB, recurrent fatigue with exertion, etc.    There are no Patient Instructions on file for this visit.  Taylor Kern, DO

## 2017-08-23 NOTE — Telephone Encounter (Signed)
Pt called to c/o weakness that began 1000. She states she had a foot doctor appt and had to cancel due to the weakness. She denies any numbness, weakness on one side of the face or body. She states the weakness has begun to subside at the time of the call. She is not having slurred speech.  Pt attributes the weakness from the steroids she is taking. Pt reports she had gallbladder surgery 1 week (08/12/17) ago. She stated she was seen at an [facility] walk in clinic for bilateral neck pain on Saturday and was prescribed ear drops and prednisone. Pt had to go get her calender to make appt and NT noted some mild SOB after she got back on the phone. [name] at PCP office and informed her of call information. Advised to make appt today or go to [facility]. Appt made with Dr. Maudie Mercury at 4:30 today.  Additional Information . Negative: [1] MILD weakness (i.e., does not interfere with ability to work, go to school, normal activities) AND [2] persists > 1 week    Weakness started today at 1000-  Answer Assessment - Initial Assessment Questions 1. DESCRIPTION: "Describe how you are feeling."     Weakness to both arms that is starting to subside 2. SEVERITY: "How bad is it?"  "Can you stand and walk?"   - MILD - Feels weak or tired, but does not interfere with work, school or normal activities   - [place] to stand and walk; weakness interferes with work, school, or normal activities   - SEVERE - Unable to stand or walk     mild 3. ONSET:  "When did the weakness begin?"     1000 this am  4. CAUSE: "What do you think is causing the weakness?"     ? The Prednisone-  5. MEDICINES: "Have you recently started a new medicine or had a change in the amount of a medicine?"     Just started the Prednisone 6. OTHER SYMPTOMS: "Do you have any other symptoms?" (e.g., chest pain, fever, cough, SOB, vomiting, diarrhea, bleeding)     No to the above 7. PREGNANCY: "Is there any chance you are pregnant?" "When was your last  menstrual period?"     n/a  Protocols used: WEAKNESS (GENERALIZED) AND FATIGUE-A-AH

## 2017-08-23 NOTE — Telephone Encounter (Signed)
Noted  

## 2017-08-24 ENCOUNTER — Ambulatory Visit: Payer: MEDICARE | Attending: Physician Assistant

## 2017-08-24 ENCOUNTER — Ambulatory Visit: Payer: MEDICARE | Admitting: Family Medicine

## 2017-08-24 DIAGNOSIS — Z48815 Encounter for surgical aftercare following surgery on the digestive system: Secondary | ICD-10-CM | POA: Diagnosis not present

## 2017-08-24 DIAGNOSIS — Z9049 Acquired absence of other specified parts of digestive tract: Secondary | ICD-10-CM | POA: Diagnosis not present

## 2017-08-25 ENCOUNTER — Other Ambulatory Visit: Payer: Self-pay

## 2017-08-25 ENCOUNTER — Inpatient Hospital Stay (HOSPITAL_COMMUNITY)
Admission: EM | Admit: 2017-08-25 | Discharge: 2017-08-30 | DRG: 281 | Disposition: A | Discharge status: Skilled Nursing Facility | Payer: MEDICARE | Attending: Family Medicine | Admitting: Family Medicine

## 2017-08-25 ENCOUNTER — Ambulatory Visit: Payer: Self-pay | Admitting: *Deleted

## 2017-08-25 ENCOUNTER — Encounter (HOSPITAL_COMMUNITY): Payer: Self-pay | Admitting: Emergency Medicine

## 2017-08-25 ENCOUNTER — Emergency Department (HOSPITAL_COMMUNITY): Payer: MEDICARE

## 2017-08-25 ENCOUNTER — Telehealth: Payer: Self-pay | Admitting: *Deleted

## 2017-08-25 DIAGNOSIS — I248 Other forms of acute ischemic heart disease: Secondary | ICD-10-CM

## 2017-08-25 DIAGNOSIS — I214 Non-ST elevation (NSTEMI) myocardial infarction: Secondary | ICD-10-CM | POA: Diagnosis present

## 2017-08-25 DIAGNOSIS — R0602 Shortness of breath: Secondary | ICD-10-CM | POA: Diagnosis not present

## 2017-08-25 DIAGNOSIS — I21A1 Myocardial infarction type 2: Principal | ICD-10-CM | POA: Diagnosis present

## 2017-08-25 DIAGNOSIS — I480 Paroxysmal atrial fibrillation: Secondary | ICD-10-CM | POA: Diagnosis not present

## 2017-08-25 DIAGNOSIS — N183 Chronic kidney disease, stage 3 (moderate): Secondary | ICD-10-CM | POA: Diagnosis present

## 2017-08-25 DIAGNOSIS — Z48815 Encounter for surgical aftercare following surgery on the digestive system: Secondary | ICD-10-CM | POA: Diagnosis not present

## 2017-08-25 DIAGNOSIS — Z974 Presence of external hearing-aid: Secondary | ICD-10-CM

## 2017-08-25 DIAGNOSIS — I4891 Unspecified atrial fibrillation: Secondary | ICD-10-CM

## 2017-08-25 DIAGNOSIS — R079 Chest pain, unspecified: Secondary | ICD-10-CM | POA: Diagnosis not present

## 2017-08-25 DIAGNOSIS — K219 Gastro-esophageal reflux disease without esophagitis: Secondary | ICD-10-CM | POA: Diagnosis present

## 2017-08-25 DIAGNOSIS — I5032 Chronic diastolic (congestive) heart failure: Secondary | ICD-10-CM | POA: Diagnosis not present

## 2017-08-25 DIAGNOSIS — I130 Hypertensive heart and chronic kidney disease with heart failure and stage 1 through stage 4 chronic kidney disease, or unspecified chronic kidney disease: Secondary | ICD-10-CM | POA: Diagnosis not present

## 2017-08-25 DIAGNOSIS — R791 Abnormal coagulation profile: Secondary | ICD-10-CM | POA: Diagnosis present

## 2017-08-25 DIAGNOSIS — Z888 Allergy status to other drugs, medicaments and biological substances status: Secondary | ICD-10-CM

## 2017-08-25 DIAGNOSIS — E860 Dehydration: Secondary | ICD-10-CM | POA: Diagnosis present

## 2017-08-25 DIAGNOSIS — R55 Syncope and collapse: Secondary | ICD-10-CM | POA: Diagnosis not present

## 2017-08-25 DIAGNOSIS — N179 Acute kidney failure, unspecified: Secondary | ICD-10-CM | POA: Diagnosis not present

## 2017-08-25 DIAGNOSIS — Z973 Presence of spectacles and contact lenses: Secondary | ICD-10-CM

## 2017-08-25 DIAGNOSIS — R778 Other specified abnormalities of plasma proteins: Secondary | ICD-10-CM | POA: Diagnosis present

## 2017-08-25 DIAGNOSIS — Z7951 Long term (current) use of inhaled steroids: Secondary | ICD-10-CM

## 2017-08-25 DIAGNOSIS — Z9049 Acquired absence of other specified parts of digestive tract: Secondary | ICD-10-CM

## 2017-08-25 DIAGNOSIS — M810 Age-related osteoporosis without current pathological fracture: Secondary | ICD-10-CM | POA: Diagnosis present

## 2017-08-25 DIAGNOSIS — I872 Venous insufficiency (chronic) (peripheral): Secondary | ICD-10-CM | POA: Diagnosis present

## 2017-08-25 DIAGNOSIS — R7989 Other specified abnormal findings of blood chemistry: Secondary | ICD-10-CM | POA: Diagnosis present

## 2017-08-25 DIAGNOSIS — Z8249 Family history of ischemic heart disease and other diseases of the circulatory system: Secondary | ICD-10-CM

## 2017-08-25 DIAGNOSIS — E041 Nontoxic single thyroid nodule: Secondary | ICD-10-CM | POA: Diagnosis present

## 2017-08-25 DIAGNOSIS — Z803 Family history of malignant neoplasm of breast: Secondary | ICD-10-CM

## 2017-08-25 DIAGNOSIS — Z853 Personal history of malignant neoplasm of breast: Secondary | ICD-10-CM

## 2017-08-25 DIAGNOSIS — Z79899 Other long term (current) drug therapy: Secondary | ICD-10-CM

## 2017-08-25 DIAGNOSIS — R748 Abnormal levels of other serum enzymes: Secondary | ICD-10-CM | POA: Diagnosis not present

## 2017-08-25 DIAGNOSIS — J069 Acute upper respiratory infection, unspecified: Secondary | ICD-10-CM | POA: Diagnosis present

## 2017-08-25 DIAGNOSIS — N951 Menopausal and female climacteric states: Secondary | ICD-10-CM | POA: Diagnosis present

## 2017-08-25 DIAGNOSIS — E785 Hyperlipidemia, unspecified: Secondary | ICD-10-CM | POA: Diagnosis present

## 2017-08-25 DIAGNOSIS — Z85828 Personal history of other malignant neoplasm of skin: Secondary | ICD-10-CM

## 2017-08-25 DIAGNOSIS — J302 Other seasonal allergic rhinitis: Secondary | ICD-10-CM | POA: Diagnosis present

## 2017-08-25 LAB — CBC WITH DIFFERENTIAL/PLATELET
BASOS ABS: 0 10*3/uL (ref 0.0–0.1)
Basophils Relative: 0 %
EOS ABS: 0.1 10*3/uL (ref 0.0–0.7)
Eosinophils Relative: 1 %
HCT: 37.6 % (ref 36.0–46.0)
Hemoglobin: 11.9 g/dL — ABNORMAL LOW (ref 12.0–15.0)
Lymphocytes Relative: 14 %
Lymphs Abs: 1.6 10*3/uL (ref 0.7–4.0)
MCH: 29.4 pg (ref 26.0–34.0)
MCHC: 31.6 g/dL (ref 30.0–36.0)
MCV: 92.8 fL (ref 78.0–100.0)
MONO ABS: 0.7 10*3/uL (ref 0.1–1.0)
MONOS PCT: 6 %
NEUTROS ABS: 8.7 10*3/uL — AB (ref 1.7–7.7)
Neutrophils Relative %: 79 %
PLATELETS: 428 10*3/uL — AB (ref 150–400)
RBC: 4.05 MIL/uL (ref 3.87–5.11)
RDW: 14.1 % (ref 11.5–15.5)
WBC: 11.1 10*3/uL — ABNORMAL HIGH (ref 4.0–10.5)

## 2017-08-25 LAB — BASIC METABOLIC PANEL
Anion gap: 16 — ABNORMAL HIGH (ref 5–15)
BUN: 34 mg/dL — AB (ref 6–20)
CO2: 21 mmol/L — ABNORMAL LOW (ref 22–32)
Calcium: 8.7 mg/dL — ABNORMAL LOW (ref 8.9–10.3)
Chloride: 104 mmol/L (ref 101–111)
Creatinine, Ser: 1.53 mg/dL — ABNORMAL HIGH (ref 0.44–1.00)
GFR calc Af Amer: 33 mL/min — ABNORMAL LOW (ref >60)
GFR calc non Af Amer: 28 mL/min — ABNORMAL LOW (ref >60)
Glucose, Bld: 158 mg/dL — ABNORMAL HIGH (ref 65–99)
POTASSIUM: 4.7 mmol/L (ref 3.5–5.1)
SODIUM: 141 mmol/L (ref 135–145)

## 2017-08-25 LAB — TROPONIN I: TROPONIN I: 0.65 ng/mL — AB (ref <0.03)

## 2017-08-25 LAB — I-STAT CG4 LACTIC ACID, ED: LACTIC ACID, VENOUS: 1.38 mmol/L (ref 0.5–1.9)

## 2017-08-25 MED ORDER — ACETAMINOPHEN 325 MG PO TABS
650.0000 mg | ORAL_TABLET | Freq: Four times a day (QID) | ORAL | Status: DC | PRN
  Administered 2017-08-28: 650 mg via ORAL
  Filled 2017-08-25: qty 2

## 2017-08-25 MED ORDER — ASPIRIN 81 MG PO CHEW
324.0000 mg | CHEWABLE_TABLET | Freq: Once | ORAL | Status: AC
  Administered 2017-08-25: 324 mg via ORAL
  Filled 2017-08-25: qty 4

## 2017-08-25 MED ORDER — PANTOPRAZOLE SODIUM 40 MG PO TBEC
40.0000 mg | DELAYED_RELEASE_TABLET | Freq: Every day | ORAL | Status: DC
  Administered 2017-08-26 – 2017-08-30 (×5): 40 mg via ORAL
  Filled 2017-08-25 (×5): qty 1

## 2017-08-25 MED ORDER — ONDANSETRON HCL 4 MG PO TABS: 4.0000 mg | ORAL_TABLET | Freq: Four times a day (QID) | ORAL | Status: DC | PRN

## 2017-08-25 MED ORDER — HEPARIN (PORCINE) IN NACL 100-0.45 UNIT/ML-% IJ SOLN
950.0000 [IU]/h | INTRAMUSCULAR | Status: DC
  Administered 2017-08-26 (×2): 1000 [IU]/h via INTRAVENOUS
  Administered 2017-08-27: 1150 [IU]/h via INTRAVENOUS
  Filled 2017-08-25 (×2): qty 250

## 2017-08-25 MED ORDER — ONDANSETRON HCL 4 MG/2ML IJ SOLN: 4.0000 mg | Freq: Four times a day (QID) | INTRAMUSCULAR | Status: DC | PRN

## 2017-08-25 MED ORDER — SODIUM CHLORIDE 0.9 % IV BOLUS (SEPSIS)
500.0000 mL | Freq: Once | INTRAVENOUS | Status: AC
  Administered 2017-08-25: 500 mL via INTRAVENOUS

## 2017-08-25 MED ORDER — ACETAMINOPHEN 650 MG RE SUPP: 650.0000 mg | Freq: Four times a day (QID) | RECTAL | Status: DC | PRN

## 2017-08-25 MED ORDER — HEPARIN BOLUS VIA INFUSION
3000.0000 [IU] | Freq: Once | INTRAVENOUS | Status: AC
  Administered 2017-08-26: 3000 [IU] via INTRAVENOUS
  Filled 2017-08-25: qty 3000

## 2017-08-25 NOTE — Progress Notes (Signed)
ANTICOAGULATION CONSULT NOTE - Initial Consult  Pharmacy Consult for heparin Indication: atrial fibrillation  Allergies  Allergen Reactions  . Celecoxib Swelling    REACTION: Swelling-leg    Patient Measurements: Height: 5\' 2"  (157.5 cm) Weight: 170 lb (77.1 kg) IBW/kg (Calculated) : 50.1 Heparin Dosing Weight: 65kg  Vital Signs: Temp: 97.8 F (36.6 C) (02/07 1756) Temp Source: Oral (02/07 1756) BP: 141/56 (02/07 2330) Pulse Rate: 88 (02/07 2330)  Labs: Recent Labs    08/25/17 1751  HGB 11.9*  HCT 37.6  PLT 428*  CREATININE 1.53*  TROPONINI 0.65*    Estimated Creatinine Clearance: 22.1 mL/min (A) (by C-G formula based on SCr of 1.53 mg/dL (H)).   Medical History: Past Medical History:  Diagnosis Date  . Allergic rhinitis, seasonal   . Breast cancer of upper-outer quadrant of right female breast ([place]) 09/13/2014  . Chronic venous insufficiency   . Dermatophytosis of nail   . Edema leg   . Family history of malignant neoplasm of breast   . Family history of malignant neoplasm of ovary   . Goiter    right thyroid nodule   . HTN (hypertension) 08/13/2017  . HX: breast cancer    bilateral  . Menopausal syndrome   . Osteoarthritis   . Osteoporosis   . Squamous cell skin cancer    right lower leg  . Wears glasses   . Wears hearing aid    both ears    Assessment: 84yo female c/o fluttering and dizziness at physical therapy, noted to have irregular heart beat, EMS called and found to be in Afib w. RVR, to begin heparin.  Goal of Therapy:  Heparin level 0.3-0.7 units/ml Monitor platelets by anticoagulation protocol: Yes   Plan:  Will give heparin 3000 units x1 followed by gtt at 1000 units/hr and monitor heparin levels and CBC.  [name], PharmD, BCPS  08/25/2017,11:38 PM

## 2017-08-25 NOTE — Telephone Encounter (Signed)
-----   Message from Lucretia Kern, DO sent at 08/24/2017 11:59 AM EST ----- Can you call pt to see how she is doing. Let her know Dr. Maudie Mercury just wanted to check to see if she if feeling better.

## 2017-08-25 NOTE — Telephone Encounter (Signed)
Shanon Brow- patient physical therapist came to work with patient today- she has had a significant change from yesterday- she has cold towel over her face when he arrived. He reports she is having an irregular heart rate- which is a change from previous. Her BP is 98/58 after 36 oz fluid 110/60. Heart rate is still irregular- patient can feel it and reports some chest discomfort as well. Patient is unable to walk without assistance to the bathroom- she is very fatigued which is a change as well. Due to her changes- advised Shanon Brow to call 911 for patient to get her to the ED.  Reason for Disposition . Unable to walk, or can only walk with assistance (e.g., requires support)  Answer Assessment - Initial Assessment Questions 1. DESCRIPTION: "Please describe your heart rate or heart beat that you are having" (e.g., fast/slow, regular/irregular, skipped or extra beats, "palpitations")     irregular fast pauses- patient was sitting in chair with wet cloth over head when he arrived. 2. ONSET: "When did it start?" (Minutes, hours or days)      Today- yesterday- regular beats 3. DURATION: "How long does it last" (e.g., seconds, minutes, hours)     Patient is reported feeling bad this afternoon 4. PATTERN "Does it come and go, or has it been constant since it started?"  "Does it get worse with exertion?"   "Are you feeling it now?"     consistent since started 5. TAP: "Using your hand, can you tap out what you are feeling on a chair or table in front of you, so that I can hear?" (Note: not all patients can do this)        6. HEART RATE: "Can you tell me your heart rate?" "How many beats in 15 seconds?"  (Note: not all patients can do this)       90-130 per Shanon Brow 7. RECURRENT SYMPTOM: "Have you ever had this before?" If so, ask: "When was the last time?" and "What happened that time?"      Not sure- does not think so 8. CAUSE: "What do you think is causing the palpitations?"     unknown 9. CARDIAC HISTORY: "Do  you have any history of heart disease?" (e.g., heart attack, angina, bypass surgery, angioplasty, arrhythmia)      unknown 10. OTHER SYMPTOMS: "Do you have any other symptoms?" (e.g., dizziness, chest pain, sweating, difficulty breathing)       Intermittent heavy feeling in chest, tingling feeling with elevated pulse 11. PREGNANCY: "Is there any chance you are pregnant?" "When was your last menstrual period?"       n/a  Protocols used: HEART RATE AND HEARTBEAT QUESTIONS-A-AH

## 2017-08-25 NOTE — H&P (Addendum)
History and Physical    [name] PPJ:[id number] DOB: Feb 21, 1922 DOA: 08/25/2017  PCP: Marletta Lor, MD  Patient coming from: Home.  Chief Complaint: A. fib.  HPI: [name] is a 84 y.o. female with history of diet-controlled hypertension breast cancer in remission who had recent cholecystectomy 10 days ago was found to have tachycardia during physical therapy session today.  EMS was called and patient was found to be in A. fib with RVR.  Patient states over the last 3-4 days has benign palpitations off and on which lasts for a few minutes each time.  Denies any chest pain.  Has taken nasal decongestants recently.  ED Course: In the ER patient was in A. fib with RVR which improved with Cardizem bolus.  Patient converted to sinus rhythm.  Troponin was mildly elevated which cardiology was consulted at this time the requested cycling cardiac markers.  On exam patient denies any chest pain or shortness of breath.  Patient did complain of dizzy spells off and on.  Review of Systems: As per HPI, rest all negative.   Past Medical History:  Diagnosis Date  . Allergic rhinitis, seasonal   . Breast cancer of upper-outer quadrant of right female breast ([place]) 09/13/2014  . Chronic venous insufficiency   . Dermatophytosis of nail   . Edema leg   . Family history of malignant neoplasm of breast   . Family history of malignant neoplasm of ovary   . Goiter    right thyroid nodule   . HTN (hypertension) 08/13/2017  . HX: breast cancer    bilateral  . Menopausal syndrome   . Osteoarthritis   . Osteoporosis   . Squamous cell skin cancer    right lower leg  . Wears glasses   . Wears hearing aid    both ears    Past Surgical History:  Procedure Laterality Date  . bilateral lumpectomies for breast cancer  Aug. 2007  . BREAST LUMPECTOMY WITH RADIOACTIVE SEED LOCALIZATION Right 10/01/2014   Procedure: BREAST LUMPECTOMY WITH RADIOACTIVE SEED LOCALIZATION;  Surgeon: Erroll Luna,  MD;  Location: [facility];  Service: General;  Laterality: Right;  . CATARACT EXTRACTION    . CHOLECYSTECTOMY N/A 08/13/2017   Procedure: LAPAROSCOPIC CHOLECYSTECTOMY WITH INTRAOPERATIVE CHOLANGIOGRAM;  Surgeon: Excell Seltzer, MD;  [facility];  Service: General;  Laterality: N/A;  . DILATION AND CURETTAGE OF UTERUS    . ESOPHAGOGASTRODUODENOSCOPY  07/26/2011   Procedure: ESOPHAGOGASTRODUODENOSCOPY (EGD);  Surgeon: Jeryl Columbia, MD;  Location: [facility];  Service: Endoscopy;  Laterality: N/A;  . history of lumbar compression fracture    . left eardum sx  1983  . left knee arthroscopic surgery    . no screening colonoscopy    . s/p bilateral lumpectomies    . SAVORY DILATION  07/26/2011   Procedure: SAVORY DILATION;  Surgeon: Jeryl Columbia, MD;  [facility];  Service: Endoscopy;  Laterality: N/A;  . status post resection squamous cell cancer right lower leg    . TONSILLECTOMY    . UMBILICAL HERNIA REPAIR       reports that  has never smoked. she has never used smokeless tobacco. She reports that she does not drink alcohol or use drugs.  Allergies  Allergen Reactions  . Celecoxib Swelling    REACTION: Swelling-leg    Family History  Problem Relation Age of Onset  . Cancer Mother 25       breast  . Heart attack  Father   . Heart attack Brother   . Heart attack Brother   . Cancer Maternal Aunt 55       breast  . Coronary artery disease Other   . Cancer Other        ovarian; daughter of mat aunt w/ BC in 58s  . Heart disease Brother   . Cancer Maternal Aunt 60       breast    Prior to Admission medications   Medication Sig Start Date End Date Taking? Authorizing Provider  azelastine (ASTELIN) 0.1 % nasal spray Place 2 sprays into both nostrils 2 (two) times daily. Use in each nostril as directed Patient taking differently: Place 2 sprays into both nostrils 2 (two) times daily as needed. Use in each nostril as directed 07/02/16  Yes [place],  Betty G, MD  Calcium Carb-Cholecalciferol (CALCIUM 600 + D PO) Take 1 tablet by mouth daily.    Yes [provider]  fluticasone (FLONASE) 50 MCG/ACT nasal spray Place 2 sprays into both nostrils daily. Patient taking differently: Place 2 sprays into both nostrils daily as needed for allergies or rhinitis.  07/07/16  Yes Marletta Lor, MD  furosemide (LASIX) 40 MG tablet Take 1 tablet (40 mg total) by mouth daily. Patient taking differently: Take 40 mg by mouth daily. As needed 02/09/13  Yes Marletta Lor, MD  ketoconazole (NIZORAL) 2 % shampoo Apply 1 application topically every 7 (seven) days.  10/31/15  Yes [provider]  loratadine (CLARITIN) 10 MG tablet Take 10 mg by mouth daily.   Yes [provider]  mometasone (NASONEX) 50 MCG/ACT nasal spray Place 1 spray into the nose daily. Patient taking differently: Place 1 spray into the nose daily as needed (rhinitis).  07/02/16  Yes [place], Betty G, MD  Multiple Vitamins-Minerals (PRESERVISION AREDS 2) CAPS Take 1 capsule by mouth 2 (two) times daily.   Yes [provider]  nystatin-triamcinolone (MYCOLOG II) cream Apply 1 application topically 2 (two) times daily. Patient taking differently: Apply 1 application topically 2 (two) times daily as needed.  08/07/15  Yes Marletta Lor, MD  pantoprazole (PROTONIX) 40 MG tablet Take 40 mg by mouth daily.  05/09/13  Yes [provider]  triamcinolone (KENALOG) 0.1 % cream Apply 1 application topically daily as needed (dry skin). Reported on 07/18/2015   Yes [provider]    Physical Exam: Vitals:   08/25/17 1815 08/25/17 1830 08/25/17 1845 08/25/17 2045  BP: (!) 126/54 (!) 123/53 (!) 124/51 135/67  Pulse: 76 72 77 84  Resp: (!) 22 (!) 25 (!) 22 (!) 26  Temp:      TempSrc:      SpO2: 100% 100% 100% 99%  Weight:      Height:          Constitutional: Moderately built and nourished. Vitals:   08/25/17 1815 08/25/17  1830 08/25/17 1845 08/25/17 2045  BP: (!) 126/54 (!) 123/53 (!) 124/51 135/67  Pulse: 76 72 77 84  Resp: (!) 22 (!) 25 (!) 22 (!) 26  Temp:      TempSrc:      SpO2: 100% 100% 100% 99%  Weight:      Height:       Eyes: Anicteric no pallor. ENMT: No discharge from the ears eyes nose or mouth. Neck: No mass felt.  No neck rigidity.  No JVD appreciated. Respiratory: No rhonchi or crepitations. Cardiovascular: S1-S2 heard no murmurs appreciated. Abdomen: Soft nontender bowel sounds  present.  No guarding or rigidity. Musculoskeletal: No edema. Skin: No rash. Neurologic: Alert awake oriented to time place and person.  Moves all extremities. Psychiatric: Appears normal.  Normal affect.   Labs on Admission: I have personally reviewed following labs and imaging studies  CBC: Recent Labs  Lab 08/25/17 1751  WBC 11.1*  NEUTROABS 8.7*  HGB 11.9*  HCT 37.6  MCV 92.8  PLT 010*   Basic Metabolic Panel: Recent Labs  Lab 08/25/17 1751  NA 141  K 4.7  CL 104  CO2 21*  GLUCOSE 158*  BUN 34*  CREATININE 1.53*  CALCIUM 8.7*   GFR: Estimated Creatinine Clearance: 22.1 mL/min (A) (by C-G formula based on SCr of 1.53 mg/dL (H)). Liver Function Tests: Recent Labs  Lab 08/19/17 1529  AST 27  ALT 78*  BILITOT 0.5  PROT 5.8*   No results for input(s): LIPASE, AMYLASE in the last 168 hours. No results for input(s): AMMONIA in the last 168 hours. Coagulation Profile: No results for input(s): INR, PROTIME in the last 168 hours. Cardiac Enzymes: Recent Labs  Lab 08/25/17 1751  TROPONINI 0.65*   BNP (last 3 results) No results for input(s): PROBNP in the last 8760 hours. HbA1C: No results for input(s): HGBA1C in the last 72 hours. CBG: No results for input(s): GLUCAP in the last 168 hours. Lipid Profile: No results for input(s): CHOL, HDL, LDLCALC, TRIG, CHOLHDL, LDLDIRECT in the last 72 hours. Thyroid Function Tests: No results for input(s): TSH, T4TOTAL, FREET4, T3FREE,  THYROIDAB in the last 72 hours. Anemia Panel: No results for input(s): VITAMINB12, FOLATE, FERRITIN, TIBC, IRON, RETICCTPCT in the last 72 hours. Urine analysis:    Component Value Date/Time   COLORURINE YELLOW 08/12/2017 [place] 08/12/2017 0627   LABSPEC 1.014 08/12/2017 0627   PHURINE 6.0 08/12/2017 0627   GLUCOSEU NEGATIVE 08/12/2017 0627   HGBUR SMALL (A) 08/12/2017 0627   HGBUR trace-intact 10/28/2009 0905   BILIRUBINUR NEGATIVE 08/12/2017 0627   KETONESUR NEGATIVE 08/12/2017 0627   PROTEINUR NEGATIVE 08/12/2017 0627   UROBILINOGEN 0.2 10/28/2009 0905   NITRITE NEGATIVE 08/12/2017 0627   LEUKOCYTESUR TRACE (A) 08/12/2017 0627   Sepsis Labs: @LABRCNTIP (procalcitonin:4,lacticidven:4) )No results found for this or any previous visit (from the past 240 hour(s)).   Radiological Exams on Admission: Dg Chest 2 View  Result Date: 08/25/2017 CLINICAL DATA:  Atrial fibrillation. Shortness of breath beginning today. EXAM: CHEST  2 VIEW COMPARISON:  08/12/2017 FINDINGS: Heart size upper limits of normal. Chronic aortic atherosclerosis. Pulmonary venous hypertension with early interstitial edema. No consolidation or collapse. Tiny amount of pleural fluid in the posterior costophrenic angles. IMPRESSION: Pulmonary venous hypertension with early interstitial edema. Electronically Signed   By: Nelson Chimes M.D.   On: 08/25/2017 20:11    EKG: Independently reviewed.  A. fib with RVR.  Second EKG shows normal sinus rhythm with possible lateral wall ischemia.  Assessment/Plan Active Problems:   Near syncope   Atrial fibrillation with RVR (HCC)   Elevated troponin    1. A. fib with RVR new onset -patient has converted back to sinus rhythm.  Patient's chads 2 vascular score is at least 3 for hypertension age and female gender.  We will cycle cardiac markers check 2D echo and check TSH.  Check d-dimer. 2. Elevated troponin -denies any chest pain.  Cycle cardiac markers check  2D echo.  Patient is on heparin. 3. Dizzy spells/near syncope likely related to A. fib.  We will get physical therapy  consult once patient is stable. 4. Recent cholecystectomy.  Addendum -she is repeat troponin came at 6 patient is asymptomatic cardiology was consulted patient was started on on Lipitor aspirin patient is already on heparin.  Later on patient went into A. fib with RVR for which patient is started on Cardizem infusion.   DVT prophylaxis: Heparin. Code Status: Full code. Family Communication: Discussed with patient. Disposition Plan: Home. Consults called: Cardiology. Admission status: Observation.   Rise Patience MD [facility] Pager [phone].  If 7PM-7AM, please contact night-coverage [email or web address] Password [email or web address]  08/25/2017, 10:07 PM

## 2017-08-25 NOTE — ED Triage Notes (Signed)
Per EMS: Pt c/o "Chest flutter" x 2 days. Pt in A-Fib w/ RVR. Initial HR was 230's. Pt given 10 mg of Cardizem twice. Pt now in the 170's but occasionally dips down to the 50's. A&Ox4.

## 2017-08-25 NOTE — Telephone Encounter (Signed)
I called the pt and she stated she is feeling better today.

## 2017-08-25 NOTE — ED Provider Notes (Signed)
[place] EMERGENCY DEPARTMENT Provider Note   CSN: [id number] Arrival date & time: 08/25/17  1756     History   Chief Complaint Chief Complaint  Patient presents with  . Atrial Fibrillation    HPI [name] is a 84 y.o. female.  HPI  84 year old female presents with EMS for atrial fibrillation.  She has no prior history of A. fib.  She was with her physical therapist and was complaining of fluttering and some dizziness and an irregular heart rate was noted and so EMS was called.  Patient had her gallbladder taken out on 1/26 and was discharged from the hospital on 1/28.  Since then her abdomen is been feeling fine.  However about 5 or 6 days ago she went to her doctor and was given a nasal spray that she does not know the name of and prednisone for nasal congestion/sinus problems.  Since then she has been having on and off palpitations but today was much worse with dizziness like she would faint.  She never had and has not been having chest pain or shortness of breath.  She has no prior known history of atrial fibrillation.  She has chronic bilateral lower extremity edema that is unchanged.  She was given 2 different 10 mg IV Cardizem boluses by EMS with no significant change.  Past Medical History:  Diagnosis Date  . Allergic rhinitis, seasonal   . Breast cancer of upper-outer quadrant of right female breast ([place]) 09/13/2014  . Chronic venous insufficiency   . Dermatophytosis of nail   . Edema leg   . Family history of malignant neoplasm of breast   . Family history of malignant neoplasm of ovary   . Goiter    right thyroid nodule   . HTN (hypertension) 08/13/2017  . HX: breast cancer    bilateral  . Menopausal syndrome   . Osteoarthritis   . Osteoporosis   . Squamous cell skin cancer    right lower leg  . Wears glasses   . Wears hearing aid    both ears    Patient Active Problem List   Diagnosis Date Noted  . Near syncope 08/25/2017  . Atrial  fibrillation with RVR ([place]) 08/25/2017  . Elevated troponin 08/25/2017  . Grade II diastolic dysfunction 64/33/2951  . Acute on chronic cholecystitis 08/13/2017  . HTN (hypertension) 08/13/2017  . Anemia 08/13/2017  . GERD (gastroesophageal reflux disease) 08/13/2017  . Acute cholecystitis 08/12/2017  . Genetic testing 10/10/2014  . Family history of malignant neoplasm of breast   . Family history of malignant neoplasm of ovary   . Breast cancer of upper-outer quadrant of right female breast ([place]) 09/13/2014  . History of esophageal stricture 07/25/2014  . Dermatophytosis of nail   . RHUS DERMATITIS 05/27/2010  . Osteoporosis 04/14/2010  . VAGINITIS, ATROPHIC 10/28/2009  . DYSURIA 10/28/2009  . GOITER, MULTINODULAR 03/05/2009  . THYROID NODULE, RIGHT 02/05/2009  . Venous (peripheral) insufficiency 02/05/2009  . SENILE OSTEOPOROSIS 01/24/2008  . URI 10/18/2007  . CUTANEOUS ERUPTIONS, DRUG-INDUCED 02/09/2007  . BREAST CANCER, HX OF 02/09/2007  . Allergic rhinitis 02/02/2007  . Osteoarthritis 02/02/2007    Past Surgical History:  Procedure Laterality Date  . bilateral lumpectomies for breast cancer  Aug. 2007  . BREAST LUMPECTOMY WITH RADIOACTIVE SEED LOCALIZATION Right 10/01/2014   Procedure: BREAST LUMPECTOMY WITH RADIOACTIVE SEED LOCALIZATION;  Surgeon: Erroll Luna, MD;  Location: [facility];  Service: General;  Laterality: Right;  . CATARACT  EXTRACTION    . CHOLECYSTECTOMY N/A 08/13/2017   Procedure: LAPAROSCOPIC CHOLECYSTECTOMY WITH INTRAOPERATIVE CHOLANGIOGRAM;  Surgeon: Excell Seltzer, MD;  Location: [facility];  Service: General;  Laterality: N/A;  . DILATION AND CURETTAGE OF UTERUS    . ESOPHAGOGASTRODUODENOSCOPY  07/26/2011   Procedure: ESOPHAGOGASTRODUODENOSCOPY (EGD);  Surgeon: Jeryl Columbia, MD;  Location: [facility];  Service: Endoscopy;  Laterality: N/A;  . history of lumbar compression fracture    . left eardum sx  1983  . left knee arthroscopic  surgery    . no screening colonoscopy    . s/p bilateral lumpectomies    . SAVORY DILATION  07/26/2011   Procedure: SAVORY DILATION;  Surgeon: Jeryl Columbia, MD;  Location: [facility];  Service: Endoscopy;  Laterality: N/A;  . status post resection squamous cell cancer right lower leg    . TONSILLECTOMY    . UMBILICAL HERNIA REPAIR      OB History    No data available       Home Medications    Prior to Admission medications   Medication Sig Start Date End Date Taking? Authorizing Provider  azelastine (ASTELIN) 0.1 % nasal spray Place 2 sprays into both nostrils 2 (two) times daily. Use in each nostril as directed Patient taking differently: Place 2 sprays into both nostrils 2 (two) times daily as needed. Use in each nostril as directed 07/02/16  Yes Martinique, Betty G, MD  Calcium Carb-Cholecalciferol (CALCIUM 600 + D PO) Take 1 tablet by mouth daily.    Yes [provider]  fluticasone (FLONASE) 50 MCG/ACT nasal spray Place 2 sprays into both nostrils daily. Patient taking differently: Place 2 sprays into both nostrils daily as needed for allergies or rhinitis.  07/07/16  Yes Marletta Lor, MD  furosemide (LASIX) 40 MG tablet Take 1 tablet (40 mg total) by mouth daily. Patient taking differently: Take 40 mg by mouth daily. As needed 02/09/13  Yes Marletta Lor, MD  ketoconazole (NIZORAL) 2 % shampoo Apply 1 application topically every 7 (seven) days.  10/31/15  Yes [provider]  loratadine (CLARITIN) 10 MG tablet Take 10 mg by mouth daily.   Yes [provider]  mometasone (NASONEX) 50 MCG/ACT nasal spray Place 1 spray into the nose daily. Patient taking differently: Place 1 spray into the nose daily as needed (rhinitis).  07/02/16  Yes Martinique, Betty G, MD  Multiple Vitamins-Minerals (PRESERVISION AREDS 2) CAPS Take 1 capsule by mouth 2 (two) times daily.   Yes [provider]  nystatin-triamcinolone (MYCOLOG II) cream Apply 1  application topically 2 (two) times daily. Patient taking differently: Apply 1 application topically 2 (two) times daily as needed.  08/07/15  Yes Marletta Lor, MD  pantoprazole (PROTONIX) 40 MG tablet Take 40 mg by mouth daily.  05/09/13  Yes [provider]  triamcinolone (KENALOG) 0.1 % cream Apply 1 application topically daily as needed (dry skin). Reported on 07/18/2015   Yes [provider]    Family History Family History  Problem Relation Age of Onset  . Cancer Mother 61       breast  . Heart attack Father   . Heart attack Brother   . Heart attack Brother   . Cancer Maternal Aunt 55       breast  . Coronary artery disease Other   . Cancer Other        ovarian; daughter of mat aunt w/ BC in 30s  . Heart disease Brother   .  Cancer Maternal Aunt 7       breast    Social History Social History   Tobacco Use  . Smoking status: Never Smoker  . Smokeless tobacco: Never Used  Substance Use Topics  . Alcohol use: No  . Drug use: No     Allergies   Celecoxib   Review of Systems Review of Systems  Respiratory: Negative for shortness of breath.   Cardiovascular: Positive for palpitations. Negative for chest pain.  Gastrointestinal: Negative for abdominal pain.  Neurological: Positive for light-headedness.  All other systems reviewed and are negative.    Physical Exam Updated Vital Signs BP 131/60   Pulse 78   Temp 97.8 F (36.6 C) (Oral)   Resp (!) 28   Ht 5\' 2"  (1.575 m)   Wt 77.1 kg (170 lb)   SpO2 97%   BMI 31.09 kg/m   Physical Exam  Constitutional: She is oriented to person, place, and time. She appears well-developed and well-nourished. No distress.  HENT:  Head: Normocephalic and atraumatic.  Right Ear: External ear normal.  Left Ear: External ear normal.  Nose: Nose normal.  Eyes: Right eye exhibits no discharge. Left eye exhibits no discharge.  Cardiovascular: Normal rate, regular rhythm, normal heart sounds and  intact distal pulses.  Pulmonary/Chest: Effort normal and breath sounds normal.  Abdominal: Soft. There is no tenderness.  Musculoskeletal: She exhibits edema (chronic, pedal/ankle).  Neurological: She is alert and oriented to person, place, and time.  Skin: Skin is warm and dry. She is not diaphoretic.  Nursing note and vitals reviewed.    ED Treatments / Results  Labs (all labs ordered are listed, but only abnormal results are displayed) Labs Reviewed  BASIC METABOLIC PANEL - Abnormal; Notable for the following components:      Result Value   CO2 21 (*)    Glucose, Bld 158 (*)    BUN 34 (*)    Creatinine, Ser 1.53 (*)    Calcium 8.7 (*)    GFR calc non Af Amer 28 (*)    GFR calc Af Amer 33 (*)    Anion gap 16 (*)    All other components within normal limits  TROPONIN I - Abnormal; Notable for the following components:   Troponin I 0.65 (*)    All other components within normal limits  CBC WITH DIFFERENTIAL/PLATELET - Abnormal; Notable for the following components:   WBC 11.1 (*)    Hemoglobin 11.9 (*)    Platelets 428 (*)    Neutro Abs 8.7 (*)    All other components within normal limits  BASIC METABOLIC PANEL  CBC  TSH  TROPONIN I  TROPONIN I  TROPONIN I  I-STAT CG4 LACTIC ACID, ED    EKG  EKG Interpretation  Date/Time:  Thursday August 25 2017 17:48:44 EST Ventricular Rate:  175 PR Interval:    QRS Duration: 104 QT Interval:  254 QTC Calculation: 433 R Axis:   8 Text Interpretation:  Atrial fibrillation with rapid ventricular response Septal infarct , age undetermined Marked ST abnormality, possible inferior subendocardial injury Abnormal ECG afib new since Aug 12 2017 Confirmed by Sherwood Gambler [phone]) on 08/25/2017 6:54:20 PM       EKG Interpretation  Date/Time:  Thursday August 25 2017 17:55:32 EST Ventricular Rate:  69 PR Interval:  118 QRS Duration: 106 QT Interval:  372 QTC Calculation: 398 R Axis:   20 Text Interpretation:  Normal sinus  rhythm Septal infarct , age undetermined ST &  T wave abnormality, consider lateral ischemia Abnormal ECG afib w/ rvr resolved, now ST changes similar to Jan 2019 Confirmed by Sherwood Gambler [phone]) on 08/25/2017 6:55:14 PM        Radiology Dg Chest 2 View  Result Date: 08/25/2017 CLINICAL DATA:  Atrial fibrillation. Shortness of breath beginning today. EXAM: CHEST  2 VIEW COMPARISON:  08/12/2017 FINDINGS: Heart size upper limits of normal. Chronic aortic atherosclerosis. Pulmonary venous hypertension with early interstitial edema. No consolidation or collapse. Tiny amount of pleural fluid in the posterior costophrenic angles. IMPRESSION: Pulmonary venous hypertension with early interstitial edema. Electronically Signed   By: Nelson Chimes M.D.   On: 08/25/2017 20:11    Procedures Procedures (including critical care time)  Medications Ordered in ED Medications  pantoprazole (PROTONIX) EC tablet 40 mg (not administered)  acetaminophen (TYLENOL) tablet 650 mg (not administered)    Or  acetaminophen (TYLENOL) suppository 650 mg (not administered)  ondansetron (ZOFRAN) tablet 4 mg (not administered)    Or  ondansetron (ZOFRAN) injection 4 mg (not administered)  sodium chloride 0.9 % bolus 500 mL (0 mLs Intravenous Stopped 08/25/17 2103)  aspirin chewable tablet 324 mg (324 mg Oral Given 08/25/17 2045)     Initial Impression / Assessment and Plan / ED Course  I have reviewed the triage vital signs and the nursing notes.  Pertinent labs & imaging results that were available during my care of the patient were reviewed by me and considered in my medical decision making (see chart for details).     Patient presents with paroxysmal atrial fibrillation in today with atrial fibrillation with RVR.  However before I could give any medicines or further evaluate the patient she converted to sinus rhythm and has stayed in sinus rhythm.  She did have a near syncopal episode.  Her troponin is mildly elevated  and likely this is.  I discussed with cardiology who agrees and advises aspirin and serial troponins but no heparin at this time.  She will probably need a discussion about possible anticoagulation given age and other risk factors.  Admit to the hospitalist service.  Given fluids for dehydration based on bump in creatinine.   CHA2DS2/VAS Stroke Risk Points      4 >= 2 Points: High Risk  1 - 1.99 Points: Medium Risk  0 Points: Low Risk    The patient's score has not changed in the past year.:  No Change     Details    This score determines the patient's risk of having a stroke if the  patient has atrial fibrillation.       Points Metrics  0 Has Congestive Heart Failure:  No   0 Has Vascular Disease:  No   1 Has Hypertension:  Yes   2 Age:  86   0 Has Diabetes:  No   0 Had Stroke:  No  Had TIA:  No  Had thromboembolism:  No   1 Female:  Yes             Final Clinical Impressions(s) / ED Diagnoses   Final diagnoses:  Atrial fibrillation with RVR [facility])  Demand ischemia [facility])    ED Discharge Orders    None       Sherwood Gambler, MD 08/25/17 2233

## 2017-08-25 NOTE — Telephone Encounter (Signed)
Pt is recovering from surgery that she had on Aug 12, 2017 to have her gallbladder removed. She had been put on Prednisone and she felt like this was making her weak and hot. She started the Prednisone on Sunday and stopped taking it on Wednesday. She is making progress with the therapist and stated today she was feeling fine. But wants to be checked out for the feelings described above.  Appointment made per husband request with Dr. Elease Hashimoto for tomorrow.   Reason for Disposition . Caller has NON-URGENT medication question about med that PCP prescribed and triager unable to answer question  Answer Assessment - Initial Assessment Questions 1. SYMPTOMS: "Do you have any symptoms?"     Hot and weakness 2. SEVERITY: If symptoms are present, ask "Are they mild, moderate or severe?"     moderate  Protocols used: MEDICATION QUESTION CALL-A-AH

## 2017-08-26 ENCOUNTER — Ambulatory Visit: Payer: MEDICARE | Admitting: Family Medicine

## 2017-08-26 ENCOUNTER — Encounter (HOSPITAL_COMMUNITY): Payer: Self-pay | Admitting: *Deleted

## 2017-08-26 ENCOUNTER — Observation Stay (HOSPITAL_COMMUNITY): Payer: MEDICARE

## 2017-08-26 ENCOUNTER — Observation Stay (HOSPITAL_BASED_OUTPATIENT_CLINIC_OR_DEPARTMENT_OTHER): Payer: MEDICARE

## 2017-08-26 DIAGNOSIS — I130 Hypertensive heart and chronic kidney disease with heart failure and stage 1 through stage 4 chronic kidney disease, or unspecified chronic kidney disease: Secondary | ICD-10-CM | POA: Diagnosis not present

## 2017-08-26 DIAGNOSIS — I10 Essential (primary) hypertension: Secondary | ICD-10-CM | POA: Diagnosis not present

## 2017-08-26 DIAGNOSIS — I214 Non-ST elevation (NSTEMI) myocardial infarction: Secondary | ICD-10-CM | POA: Diagnosis present

## 2017-08-26 DIAGNOSIS — Z803 Family history of malignant neoplasm of breast: Secondary | ICD-10-CM | POA: Diagnosis not present

## 2017-08-26 DIAGNOSIS — R748 Abnormal levels of other serum enzymes: Secondary | ICD-10-CM | POA: Diagnosis not present

## 2017-08-26 DIAGNOSIS — Z974 Presence of external hearing-aid: Secondary | ICD-10-CM | POA: Diagnosis not present

## 2017-08-26 DIAGNOSIS — R05 Cough: Secondary | ICD-10-CM | POA: Diagnosis not present

## 2017-08-26 DIAGNOSIS — I248 Other forms of acute ischemic heart disease: Secondary | ICD-10-CM | POA: Diagnosis not present

## 2017-08-26 DIAGNOSIS — I480 Paroxysmal atrial fibrillation: Secondary | ICD-10-CM | POA: Diagnosis not present

## 2017-08-26 DIAGNOSIS — R2681 Unsteadiness on feet: Secondary | ICD-10-CM | POA: Diagnosis not present

## 2017-08-26 DIAGNOSIS — E785 Hyperlipidemia, unspecified: Secondary | ICD-10-CM | POA: Diagnosis present

## 2017-08-26 DIAGNOSIS — Z853 Personal history of malignant neoplasm of breast: Secondary | ICD-10-CM | POA: Diagnosis not present

## 2017-08-26 DIAGNOSIS — M810 Age-related osteoporosis without current pathological fracture: Secondary | ICD-10-CM | POA: Diagnosis present

## 2017-08-26 DIAGNOSIS — J069 Acute upper respiratory infection, unspecified: Secondary | ICD-10-CM | POA: Diagnosis present

## 2017-08-26 DIAGNOSIS — N179 Acute kidney failure, unspecified: Secondary | ICD-10-CM

## 2017-08-26 DIAGNOSIS — R55 Syncope and collapse: Secondary | ICD-10-CM | POA: Diagnosis not present

## 2017-08-26 DIAGNOSIS — I872 Venous insufficiency (chronic) (peripheral): Secondary | ICD-10-CM | POA: Diagnosis not present

## 2017-08-26 DIAGNOSIS — I21A1 Myocardial infarction type 2: Secondary | ICD-10-CM | POA: Diagnosis not present

## 2017-08-26 DIAGNOSIS — I4891 Unspecified atrial fibrillation: Secondary | ICD-10-CM

## 2017-08-26 DIAGNOSIS — R791 Abnormal coagulation profile: Secondary | ICD-10-CM | POA: Diagnosis present

## 2017-08-26 DIAGNOSIS — Z85828 Personal history of other malignant neoplasm of skin: Secondary | ICD-10-CM | POA: Diagnosis not present

## 2017-08-26 DIAGNOSIS — R29898 Other symptoms and signs involving the musculoskeletal system: Secondary | ICD-10-CM | POA: Diagnosis not present

## 2017-08-26 DIAGNOSIS — N951 Menopausal and female climacteric states: Secondary | ICD-10-CM | POA: Diagnosis present

## 2017-08-26 DIAGNOSIS — Z973 Presence of spectacles and contact lenses: Secondary | ICD-10-CM | POA: Diagnosis not present

## 2017-08-26 DIAGNOSIS — M6281 Muscle weakness (generalized): Secondary | ICD-10-CM | POA: Diagnosis not present

## 2017-08-26 DIAGNOSIS — I5032 Chronic diastolic (congestive) heart failure: Secondary | ICD-10-CM | POA: Diagnosis not present

## 2017-08-26 DIAGNOSIS — K219 Gastro-esophageal reflux disease without esophagitis: Secondary | ICD-10-CM | POA: Diagnosis not present

## 2017-08-26 DIAGNOSIS — Z7951 Long term (current) use of inhaled steroids: Secondary | ICD-10-CM | POA: Diagnosis not present

## 2017-08-26 DIAGNOSIS — Z9049 Acquired absence of other specified parts of digestive tract: Secondary | ICD-10-CM | POA: Diagnosis not present

## 2017-08-26 DIAGNOSIS — N183 Chronic kidney disease, stage 3 (moderate): Secondary | ICD-10-CM | POA: Diagnosis not present

## 2017-08-26 DIAGNOSIS — J302 Other seasonal allergic rhinitis: Secondary | ICD-10-CM | POA: Diagnosis not present

## 2017-08-26 DIAGNOSIS — E7800 Pure hypercholesterolemia, unspecified: Secondary | ICD-10-CM | POA: Diagnosis not present

## 2017-08-26 DIAGNOSIS — R7989 Other specified abnormal findings of blood chemistry: Secondary | ICD-10-CM | POA: Diagnosis not present

## 2017-08-26 DIAGNOSIS — E041 Nontoxic single thyroid nodule: Secondary | ICD-10-CM | POA: Diagnosis not present

## 2017-08-26 DIAGNOSIS — E860 Dehydration: Secondary | ICD-10-CM | POA: Diagnosis not present

## 2017-08-26 LAB — TROPONIN I
TROPONIN I: 6.39 ng/mL — AB (ref <0.03)
TROPONIN I: 7.3 ng/mL — AB (ref <0.03)
Troponin I: 4.21 ng/mL (ref <0.03)

## 2017-08-26 LAB — BASIC METABOLIC PANEL
Anion gap: 12 (ref 5–15)
BUN: 27 mg/dL — ABNORMAL HIGH (ref 6–20)
CO2: 21 mmol/L — ABNORMAL LOW (ref 22–32)
Calcium: 8.2 mg/dL — ABNORMAL LOW (ref 8.9–10.3)
Chloride: 106 mmol/L (ref 101–111)
Creatinine, Ser: 1.27 mg/dL — ABNORMAL HIGH (ref 0.44–1.00)
GFR calc Af Amer: 41 mL/min — ABNORMAL LOW (ref >60)
GFR calc non Af Amer: 35 mL/min — ABNORMAL LOW (ref >60)
Glucose, Bld: 126 mg/dL — ABNORMAL HIGH (ref 65–99)
Potassium: 4.3 mmol/L (ref 3.5–5.1)
Sodium: 139 mmol/L (ref 135–145)

## 2017-08-26 LAB — CBC
HEMATOCRIT: 34.2 % — AB (ref 36.0–46.0)
HEMOGLOBIN: 10.5 g/dL — AB (ref 12.0–15.0)
MCH: 28.3 pg (ref 26.0–34.0)
MCHC: 30.7 g/dL (ref 30.0–36.0)
MCV: 92.2 fL (ref 78.0–100.0)
Platelets: 379 10*3/uL (ref 150–400)
RBC: 3.71 MIL/uL — ABNORMAL LOW (ref 3.87–5.11)
RDW: 14.3 % (ref 11.5–15.5)
WBC: 9.5 10*3/uL (ref 4.0–10.5)

## 2017-08-26 LAB — ECHOCARDIOGRAM COMPLETE
HEIGHTINCHES: 62 in
WEIGHTICAEL: 2720 [oz_av]

## 2017-08-26 LAB — HEPARIN LEVEL (UNFRACTIONATED)
Heparin Unfractionated: 0.47 IU/mL (ref 0.30–0.70)
Heparin Unfractionated: 0.31 IU/mL (ref 0.30–0.70)

## 2017-08-26 LAB — INFLUENZA PANEL BY PCR (TYPE A & B)
INFLAPCR: NEGATIVE
Influenza B By PCR: NEGATIVE

## 2017-08-26 LAB — D-DIMER, QUANTITATIVE: D-Dimer, Quant: 2.48 ug/mL-FEU — ABNORMAL HIGH (ref 0.00–0.50)

## 2017-08-26 LAB — TSH: TSH: 3.546 u[IU]/mL (ref 0.350–4.500)

## 2017-08-26 MED ORDER — ASPIRIN 325 MG PO TABS
325.0000 mg | ORAL_TABLET | Freq: Every day | ORAL | Status: DC
  Administered 2017-08-27: 325 mg via ORAL
  Filled 2017-08-26: qty 1

## 2017-08-26 MED ORDER — ATORVASTATIN CALCIUM 80 MG PO TABS
80.0000 mg | ORAL_TABLET | Freq: Every day | ORAL | Status: DC
  Administered 2017-08-26 – 2017-08-29 (×5): 80 mg via ORAL
  Filled 2017-08-26 (×6): qty 1

## 2017-08-26 MED ORDER — DILTIAZEM HCL 30 MG PO TABS
30.0000 mg | ORAL_TABLET | Freq: Four times a day (QID) | ORAL | Status: DC
  Administered 2017-08-27: 30 mg via ORAL
  Filled 2017-08-26: qty 1

## 2017-08-26 MED ORDER — IOPAMIDOL (ISOVUE-370) INJECTION 76%
INTRAVENOUS | Status: AC
  Administered 2017-08-26: 80 mL
  Filled 2017-08-26: qty 100

## 2017-08-26 MED ORDER — FUROSEMIDE 40 MG PO TABS
40.0000 mg | ORAL_TABLET | Freq: Every day | ORAL | Status: DC
  Administered 2017-08-26 – 2017-08-30 (×5): 40 mg via ORAL
  Filled 2017-08-26 (×5): qty 1
  Filled 2017-08-26: qty 2

## 2017-08-26 MED ORDER — DILTIAZEM LOAD VIA INFUSION
10.0000 mg | Freq: Once | INTRAVENOUS | Status: AC
  Administered 2017-08-26: 10 mg via INTRAVENOUS

## 2017-08-26 MED ORDER — DILTIAZEM HCL-DEXTROSE 100-5 MG/100ML-% IV SOLN (PREMIX)
5.0000 mg/h | INTRAVENOUS | Status: DC
  Administered 2017-08-26 (×2): 15 mg/h via INTRAVENOUS
  Administered 2017-08-26: 5 mg/h via INTRAVENOUS
  Administered 2017-08-26: 12.5 mg/h via INTRAVENOUS
  Administered 2017-08-26: 5 mg/h via INTRAVENOUS
  Filled 2017-08-26 (×3): qty 100

## 2017-08-26 NOTE — ED Notes (Signed)
Lunch order sent 

## 2017-08-26 NOTE — ED Notes (Signed)
ECHO at bedside.

## 2017-08-26 NOTE — Psychosocial Assessment (Signed)
[facility] paged HR 53 b/p 112/40 Cardizem gtt.titrated to 5mg  paused now awaiting orders.

## 2017-08-26 NOTE — ED Notes (Signed)
Pt converted from afib to sinus rhythm

## 2017-08-26 NOTE — ED Notes (Signed)
Heparin verified by Levada Dy, RN

## 2017-08-26 NOTE — ED Notes (Signed)
Patient transported to CT 

## 2017-08-26 NOTE — ED Notes (Signed)
pts nurse  meghan made aware of troponin

## 2017-08-26 NOTE — Telephone Encounter (Signed)
Pt seen in ED and discharged home.

## 2017-08-26 NOTE — ED Notes (Addendum)
[facility] paged and notified of troponin 6.39 and D- dimer 2.48. Was told he would consult cardiology. Will continue to monitor.

## 2017-08-26 NOTE — Progress Notes (Signed)
[facility] Cardiologist paged HR 53 BP 112/40 Cardizem was titrated 5. Arthor Captain LPN

## 2017-08-26 NOTE — Progress Notes (Signed)
ANTICOAGULATION CONSULT NOTE - Follow Up Consult  Pharmacy Consult for heparin Indication: atrial fibrillation  Allergies  Allergen Reactions  . Celecoxib Swelling    REACTION: Swelling-leg    Patient Measurements: Height: 5\' 2"  (157.5 cm) Weight: 170 lb (77.1 kg) IBW/kg (Calculated) : 50.1 Heparin Dosing Weight: 65kg  Vital Signs: BP: 129/53 (02/08 0900) Pulse Rate: 66 (02/08 0900)  Labs: Recent Labs    08/25/17 1751 08/25/17 2330 08/26/17 0242 08/26/17 0820  HGB 11.9*  --  10.5*  --   HCT 37.6  --  34.2*  --   PLT 428*  --  379  --   HEPARINUNFRC  --   --   --  0.47  CREATININE 1.53*  --  1.27*  --   TROPONINI 0.65* 6.39* 7.30*  --     Estimated Creatinine Clearance: 26.6 mL/min (A) (by C-G formula based on SCr of 1.27 mg/dL (H)).   Medical History: Past Medical History:  Diagnosis Date  . Allergic rhinitis, seasonal   . Breast cancer of upper-outer quadrant of right female breast ([place]) 09/13/2014  . Chronic venous insufficiency   . Dermatophytosis of nail   . Edema leg   . Family history of malignant neoplasm of breast   . Family history of malignant neoplasm of ovary   . Goiter    right thyroid nodule   . HTN (hypertension) 08/13/2017  . HX: breast cancer    bilateral  . Menopausal syndrome   . Osteoarthritis   . Osteoporosis   . Squamous cell skin cancer    right lower leg  . Wears glasses   . Wears hearing aid    both ears    Assessment: 84yo female c/o fluttering and dizziness at physical therapy, noted to have irregular heart beat, EMS called and found to be in Afib w. RVR, to begin heparin.  Initial heparin level came back therapeutic at 0.47, on 1000 units/hr. Hgb down from 11.9 to 10.5, platelets down from 428 to 379. No signs/symptoms of bleeding. No infusion issues.   Goal of Therapy:  Heparin level 0.3-0.7 units/ml Monitor platelets by anticoagulation protocol: Yes   Plan:  Continue heparin infusion at 1000 units/hr Obtain  confirmatory 8 hour heparin level Monitor daily HL and CBC  Monitor for signs/symptoms of bleeding.  Doylene Canard, PharmD Clinical Pharmacist  Pager: [phone] Clinical Phone for 08/26/2017 until 3:30pm: [phone] If after 3:30pm, please call main pharmacy at [phone]  08/26/2017,9:33 AM

## 2017-08-26 NOTE — ED Notes (Signed)
Admitting provider paged regarding pt most recent troponin

## 2017-08-26 NOTE — ED Notes (Signed)
Paged MD heart rates are back up.

## 2017-08-26 NOTE — Consult Note (Signed)
Admit date: 08/25/2017 Referring Physician  Taylor Pour, MD Primary Physician  Taylor Logan, MD Primary Cardiologist  None Reason for Consultation  AFib with RVR, NSTEMI  HPI: [name] is a 84 y.o. female who is being seen today for the evaluation of AFib with RVR and elevated troponin at the request of the hospitalist service   [name] is a highly-functional 84 y/o F with history of diastolic heart failure (ECHO 08/12/17:LVEF 55-60% and grade 2 diastolic dysfunction), HTN, history of breast cancer in remission and recent cholecystectomy 1/25 who was sent from physical therapy due to tachycardia. She was in AFib with RVR on arrival with rates recorded as high as 200 and was started on Cardizem infusion. Converted to NSR for several hours but returned to AFib with RVR early this morning. She is still on Cardizem at 15 mg/hr. Initial troponin on arrival 0.65 however trended up promptly with repeat troponin 6.39 > 7.3 and most recently troponin 4.21. CXR on admission with pulmonary venous congestion and interval development of mild interstitial edema.  She does note 3-4 day history of intermittent palpations which resolve spontaneously after a few minutes. She also admits to intermittent dizziness/weakness but denied any current or prior history of chest pain. Patient notes symptoms started 2/1 after prednisone and an unknown nasal spray were prescribed for sinus congestion and "clogged eustachian tubes."   PMH:   Past Medical History:  Diagnosis Date  . Allergic rhinitis, seasonal   . Breast cancer of upper-outer quadrant of right female breast ([facility]) 09/13/2014  . Chronic venous insufficiency   . Dermatophytosis of nail   . Edema leg   . Family history of malignant neoplasm of breast   . Family history of malignant neoplasm of ovary   . Goiter    right thyroid nodule   . HTN (hypertension) 08/13/2017  . HX: breast cancer    bilateral  . Menopausal syndrome   . Osteoarthritis   .  Osteoporosis   . Squamous cell skin cancer    right lower leg  . Wears glasses   . Wears hearing aid    both ears     PSH:   Past Surgical History:  Procedure Laterality Date  . bilateral lumpectomies for breast cancer  Aug. 2007  . BREAST LUMPECTOMY WITH RADIOACTIVE SEED LOCALIZATION Right 10/01/2014   Procedure: BREAST LUMPECTOMY WITH RADIOACTIVE SEED LOCALIZATION;  Surgeon: Erroll Luna, MD;  Location: [facility];  Service: General;  Laterality: Right;  . CATARACT EXTRACTION    . CHOLECYSTECTOMY N/A 08/13/2017   Procedure: LAPAROSCOPIC CHOLECYSTECTOMY WITH INTRAOPERATIVE CHOLANGIOGRAM;  Surgeon: Excell Seltzer, MD;  Location: [facility];  Service: General;  Laterality: N/A;  . DILATION AND CURETTAGE OF UTERUS    . ESOPHAGOGASTRODUODENOSCOPY  07/26/2011   Procedure: ESOPHAGOGASTRODUODENOSCOPY (EGD);  Surgeon: Jeryl Columbia, MD;  Location: [facility];  Service: Endoscopy;  Laterality: N/A;  . history of lumbar compression fracture    . left eardum sx  1983  . left knee arthroscopic surgery    . no screening colonoscopy    . s/p bilateral lumpectomies    . SAVORY DILATION  07/26/2011   Procedure: SAVORY DILATION;  Surgeon: Jeryl Columbia, MD;  Location: [facility];  Service: Endoscopy;  Laterality: N/A;  . status post resection squamous cell cancer right lower leg    . TONSILLECTOMY    . UMBILICAL HERNIA REPAIR      Allergies:  Celecoxib Prior to Admit Meds:   (Not  in a hospital admission) Fam HX:    Family History  Problem Relation Age of Onset  . Cancer Mother 38       breast  . Heart attack Father   . Heart attack Brother   . Heart attack Brother   . Cancer Maternal Aunt 55       breast  . Coronary artery disease Other   . Cancer Other        ovarian; daughter of mat aunt w/ BC in 87s  . Heart disease Brother   . Cancer Maternal Aunt 61       breast   Social HX:    Social History   Socioeconomic History  . Marital status: Married    Spouse name:  Not on file  . Number of children: Not on file  . Years of education: Not on file  . Highest education level: Not on file  Social Needs  . Financial resource strain: Not on file  . Food insecurity - worry: Not on file  . Food insecurity - inability: Not on file  . Transportation needs - medical: Not on file  . Transportation needs - non-medical: Not on file  Occupational History  . Not on file  Tobacco Use  . Smoking status: Never Smoker  . Smokeless tobacco: Never Used  Substance and Sexual Activity  . Alcohol use: No  . Drug use: No  . Sexual activity: Not on file  Other Topics Concern  . Not on file  Social History Narrative  . Not on file    ROS:  All  ROS were addressed and are negative except what is stated in the HPI  Physical Exam: Blood pressure (!) 135/52, pulse 61, temperature 97.8 F (36.6 C), temperature source Oral, resp. rate (!) 24, height 5\' 2"  (1.575 m), weight 170 lb (77.1 kg), SpO2 99 %.  General: Pleasant caucasian woman who appears younger than stated age. Resting comfortably in bed without distress. No diaphoresis Head, Neck: Normocephalic, atraumatic. +JVD to 2" below the mandible Lungs:  Bibasilar crackles. Breathing unlabored on 2L via [place] Heart: RRR without murmur.  Abdomen: Bowel sounds are positive, abdomen soft and non-tender.  Msk: Extremities warm and perfused. Normal strength and tone for age. Extremities: BL LE edema, R>L (normal and at baseline per pt).  Neuro: Alert and oriented X 3. Psych:  Good affect, responds appropriately  Labs:   Lab Results  Component Value Date   WBC 9.5 08/26/2017   HGB 10.5 (L) 08/26/2017   HCT 34.2 (L) 08/26/2017   MCV 92.2 08/26/2017   PLT 379 08/26/2017   Recent Labs  Lab 08/19/17 1529  08/26/17 0242  NA  --    < > 139  K  --    < > 4.3  CL  --    < > 106  CO2  --    < > 21*  BUN  --    < > 27*  CREATININE  --    < > 1.27*  CALCIUM  --    < > 8.2*  PROT 5.8*  --   --   BILITOT 0.5  --   --     ALT 78*  --   --   AST 27  --   --   GLUCOSE  --    < > 126*   < > = values in this interval not displayed.   No results found for: PTT Lab Results  Component Value Date  INR 0.96 11/28/2015   Lab Results  Component Value Date   TROPONINI 4.21 ([place]) 08/26/2017     Lab Results  Component Value Date   CHOL 245 (H) 06/25/2013   CHOL 204 (H) 04/19/2011   CHOL 182 04/10/2009   Lab Results  Component Value Date   HDL 58.90 06/25/2013   HDL 62.10 04/19/2011   HDL 46.20 04/10/2009   Lab Results  Component Value Date   LDLCALC 103 (H) 04/10/2009   Lab Results  Component Value Date   TRIG 83.0 06/25/2013   TRIG 188.0 (H) 04/19/2011   TRIG 166.0 (H) 04/10/2009   Lab Results  Component Value Date   CHOLHDL 4 06/25/2013   CHOLHDL 3 04/19/2011   CHOLHDL 4 04/10/2009   Lab Results  Component Value Date   LDLDIRECT 173.9 06/25/2013   LDLDIRECT 119.0 04/19/2011      Radiology:  Dg Chest 2 View  Result Date: 08/25/2017 CLINICAL DATA:  Atrial fibrillation. Shortness of breath beginning today. EXAM: CHEST  2 VIEW COMPARISON:  08/12/2017 FINDINGS: Heart size upper limits of normal. Chronic aortic atherosclerosis. Pulmonary venous hypertension with early interstitial edema. No consolidation or collapse. Tiny amount of pleural fluid in the posterior costophrenic angles. IMPRESSION: Pulmonary venous hypertension with early interstitial edema. Electronically Signed   By: Nelson Chimes M.D.   On: 08/25/2017 20:11   ECHO 08/13/2107:  Study Conclusions - Left ventricle: The cavity size was normal. There was mild   concentric hypertrophy. Systolic function was normal. The   estimated ejection fraction was in the range of 55% to 60%. Wall   motion was normal; there were no regional wall motion   abnormalities. Grade 2 diastolic dysfunction - Aortic valve: There was very mild stenosis.  - Mitral valve: Moderately to severely calcified annulus. The   findings are consistent with mild  stenosis.  - Left atrium: The atrium was mildly dilated. - Right atrium: The atrium was mildly dilated. - Tricuspid valve: There was moderate regurgitation. - Pulmonary arteries: Systolic pressure was moderately increased.   PA peak pressure: 59 mm Hg (S). -  A small pericardial effusion was   identified circumferential to the heart. There was no evidence of   hemodynamic compromise. - Dilated IVC with blunted respirophasic diameter changes consistent with elevated CVP  Telemetry    Currently NSR with rate in 60's but spent several hours in AFib with rates in 130's - Personally Reviewed  ECG   Initial EKG 08/25/17: Atrial fibrillation with RVR, rate 176  - Personally Reviewed  08/26/17: NSR with rate 72. Narrow QRS and normal QT. No ST segment changes, or new Q-waves.   ASSESSMENT: This is a highly-functional 84 y/o F admitted with intermittent palpitations and dizziness after URI and addition of prednisone and nasal sprays. Found to be in AFib with RVR to 200's and has been in NSR intermittently. Troponin has been elevated to 7.3 which is trending down and D-dimer elevated at 2.5. She is on dilt and heparin infusions for management of AFib with RVR and NSTEMI. CTA ordered for evaluation of PE.    PLAN: AFib with RVR: With recent history of surgery, URI and new medications (Prednisone, nasal spray). She notes intermittent palpitations associated with dizziness but resolves spontaneously after several minutes. She was started on a Cardizem infusion and heparin on arrival and converted to NSR for several hours prior to returning to AFib with RVR this morning. EKG obtained during NSR was without ST segment changes or new  Q-waves. She was in NSR during my evaluation which was confirmed with EKG shortly after. Etiology most likely multifactorial and will continue to hold possible offending medications.  -Flu panel negative -Rate control with dilt infusion for now, will transition to PO when weaned  off ggt -Heparin for anticoagulation -Telemetry -ECHO pending  NSTEMI: EKG without ST elevations however troponin 0.69 > 7.3 > 4.2. She has been without chest pain during this process and denied any prior history of chest pain. Believe NSTEMI related to RVR although PE is on the differential as well. She was given ASA, Heparin and was started on Lipitor overnight. -Optimize RFs -ECHO  Elevated D-Dimer: D-dimer 2.5 on admission and patient does have recent surgical history increasing her risk of PE. In addition, she has asymmetric LE (R>L) and notes this is chronic. Given her history, PE could certainly be the center of the issue.There is no recorded hypoxia yet patient on 2L via [place].  -CTA for evaluation of PE  Diastolic HF: Echo 2 weeks ago with normal EF and grade 2 diastolic dysfunction. IVC was dilated and respirophasic changes were blunted c/w elevated CVP. She takes Lasix 40 mg PO daily and has chronic LE edema which has not worsened recently per patient. CXR on admission did show vascular congestion and interval development of interstitial edema. Believe patient might benefit from diuresis, will discuss with attending.  -PO Lasix 40mg  continued, consider additional dose -Monitor volume status and respiratory status  AKI: Cr 1.5 on admission which has improved today to 1.27. BL around 1.1.  Her PO intake has been stable since her last hospital dc and she denies vomiting, diarrhea or urinary frequency to suggest increased loss. Likely related to above processes and will monitor.  -Pt will get contrast for CTA, will follow renal function  HTN: She is not on any antihypertensives at home but does take lasix daily. Pressures stable.    For questions or updates, please contact [facility] Please consult [email or web address] for contact info under Cardiology/STEMI.  Helmut Hennon, DO  08/26/2017  12:22 PM

## 2017-08-26 NOTE — ED Notes (Signed)
Pt ate oatmeal, drank coffee

## 2017-08-26 NOTE — Care Management Obs Status (Signed)
[facility] NOTIFICATION   Patient Details  Name: [name] MRN: [id number] Date of Birth: 1922/03/29   Medicare Observation Status Notification Given:  Yes    Carles Collet, RN 08/26/2017, 2:40 PM

## 2017-08-26 NOTE — Progress Notes (Signed)
PROGRESS NOTE [facility]   [name]   JOI:[id number] DOB: 1922-02-07  DOA: 08/25/2017 PCP: Marletta Lor, MD   Brief Narrative:  [name] is a 84 year old female with past medical history of hypertension, breast cancer in remission who had a recent cholecystectomy 10 days ago sent by physical therapist due to tachycardia.  Upon evaluation she was found to be in A. fib with RVR and patient was admitted for further management.  Subjective: Patient seen and examined, she denies any chest pain, shortness of breath, palpitations and dizziness.  She does state that she has been coughing for a while and has been using nasal sprays and steroids.  No other concerns at this moment  Assessment & Plan: A. fib with RVR New onset Unclear etiology but likely multifactorial from stress, new medications, ?  Viral infection CHADsVAsC 3, patient was placed on heparin drip, will defer to cardiology for oral anticoagulation selection.  But she has been started on Cardizem drip and now she has converted to normal sinus rhythm.  EKG shows A. fib with no ST segment changes.  Elevated troponin likely due to demand ischemia, may need to be evaluated for ACS leading into A. fib. Cardiology has been consulted and recommendations appreciated.  Checking flu shot given URI symptoms  Diastolic heart failure Seems to be compensated at this time Echocardiogram performed 07/2517 shows EF 55-60% with grade 2 diastolic dysfunction Continue Lasix  Acute renal failure Baseline creatinine 1.12 weeks ago, upon admission 1.5 Likely prerenal Creatinine currently improved will continue to monitor for now.  Hypertension Patient does not take any medications at home BP stable, continue to monitor  DVT prophylaxis: Heparin ggt Code Status: Full code Family Communication: None at bedside Disposition Plan: Pending cardiology evaluation  Consultants:   Cardiology  Procedures:    None  Antimicrobials:  None   Objective: Vitals:   08/26/17 1200 08/26/17 1300 08/26/17 1330 08/26/17 1400  BP: (!) 135/52 (!) 126/51 (!) 124/53 (!) 120/97  Pulse: 61 (!) 59 (!) 58 (!) 58  Resp: (!) 24 19 (!) 21 (!) 22  Temp:      TempSrc:      SpO2: 99% 97% 97% 98%  Weight:      Height:        Intake/Output Summary (Last 24 hours) at 08/26/2017 1454 Last data filed at 08/25/2017 2103 Gross per 24 hour  Intake 500 ml  Output -  Net 500 ml   Filed Weights   08/25/17 1753  Weight: 77.1 kg (170 lb)    Examination:  General exam: Appears calm and comfortable  HEENT: OP moist and clear Respiratory system: Clear to auscultation. No wheezes,crackle or rhonchi Cardiovascular system: S1 & S2 heard, RRR. No JVD, murmurs, rubs or gallops Gastrointestinal system: Abdomen is nondistended, soft and nontender. Central nervous system: Alert and oriented. No focal neurological deficits. Extremities: No pedal edema.  Skin: No rashes Psychiatry:  Mood & affect appropriate.    Data Reviewed: I have personally reviewed following labs and imaging studies  CBC: Recent Labs  Lab 08/25/17 1751 08/26/17 0242  WBC 11.1* 9.5  NEUTROABS 8.7*  --   HGB 11.9* 10.5*  HCT 37.6 34.2*  MCV 92.8 92.2  PLT 428* 470   Basic Metabolic Panel: Recent Labs  Lab 08/25/17 1751 08/26/17 0242  NA 141 139  K 4.7 4.3  CL 104 106  CO2 21* 21*  GLUCOSE 158* 126*  BUN 34* 27*  CREATININE 1.53* 1.27*  CALCIUM 8.7* 8.2*   GFR: Estimated Creatinine Clearance: 26.6 mL/min (A) (by C-G formula based on SCr of 1.27 mg/dL (H)). Liver Function Tests: Recent Labs  Lab 08/19/17 1529  AST 27  ALT 78*  BILITOT 0.5  PROT 5.8*   No results for input(s): LIPASE, AMYLASE in the last 168 hours. No results for input(s): AMMONIA in the last 168 hours. Coagulation Profile: No results for input(s): INR, PROTIME in the last 168 hours. Cardiac Enzymes: Recent Labs  Lab 08/25/17 1751 08/25/17 2330  08/26/17 0242 08/26/17 0934  TROPONINI 0.65* 6.39* 7.30* 4.21*   BNP (last 3 results) No results for input(s): PROBNP in the last 8760 hours. HbA1C: No results for input(s): HGBA1C in the last 72 hours. CBG: No results for input(s): GLUCAP in the last 168 hours. Lipid Profile: No results for input(s): CHOL, HDL, LDLCALC, TRIG, CHOLHDL, LDLDIRECT in the last 72 hours. Thyroid Function Tests: Recent Labs    08/25/17 2330  TSH 3.546   Anemia Panel: No results for input(s): VITAMINB12, FOLATE, FERRITIN, TIBC, IRON, RETICCTPCT in the last 72 hours. Sepsis Labs: Recent Labs  Lab 08/25/17 2030  LATICACIDVEN 1.38    No results found for this or any previous visit (from the past 240 hour(s)).    Radiology Studies: Dg Chest 2 View  Result Date: 08/25/2017 CLINICAL DATA:  Atrial fibrillation. Shortness of breath beginning today. EXAM: CHEST  2 VIEW COMPARISON:  08/12/2017 FINDINGS: Heart size upper limits of normal. Chronic aortic atherosclerosis. Pulmonary venous hypertension with early interstitial edema. No consolidation or collapse. Tiny amount of pleural fluid in the posterior costophrenic angles. IMPRESSION: Pulmonary venous hypertension with early interstitial edema. Electronically Signed   By: Nelson Chimes M.D.   On: 08/25/2017 20:11      Scheduled Meds: . aspirin  325 mg Oral Daily  . atorvastatin  80 mg Oral q1800  . pantoprazole  40 mg Oral Daily   Continuous Infusions: . diltiazem (CARDIZEM) infusion 15 mg/hr (08/26/17 1136)  . heparin 1,000 Units/hr (08/26/17 1136)     LOS: 0 days    Time spent: Total of 35 minutes spent with pt, greater than 50% of which was spent in discussion of  treatment, counseling and coordination of care   Chipper Oman, MD Pager: Text Page via [email or web address]   If 7PM-7AM, please contact night-coverage [email or web address] 08/26/2017, 2:54 PM

## 2017-08-26 NOTE — ED Notes (Signed)
Attempted to call report x 1  

## 2017-08-26 NOTE — Progress Notes (Signed)
ANTICOAGULATION CONSULT NOTE - Follow Up Consult  Pharmacy Consult for heparin Indication: atrial fibrillation  Allergies  Allergen Reactions  . Celecoxib Swelling    REACTION: Swelling-leg    Patient Measurements: Height: 5\' 3"  (160 cm) Weight: 169 lb 4.8 oz (76.8 kg) IBW/kg (Calculated) : 52.4 Heparin Dosing Weight: 65kg  Vital Signs: Temp: 97.6 F (36.4 C) (02/08 1748) Temp Source: Axillary (02/08 1748) BP: 140/102 (02/08 1748) Pulse Rate: 62 (02/08 1748)  Labs: Recent Labs    08/25/17 1751 08/25/17 2330 08/26/17 0242 08/26/17 0820 08/26/17 0934 08/26/17 1852  HGB 11.9*  --  10.5*  --   --   --   HCT 37.6  --  34.2*  --   --   --   PLT 428*  --  379  --   --   --   HEPARINUNFRC  --   --   --  0.47  --  0.31  CREATININE 1.53*  --  1.27*  --   --   --   TROPONINI 0.65* 6.39* 7.30*  --  4.21*  --     Estimated Creatinine Clearance: 27.2 mL/min (A) (by C-G formula based on SCr of 1.27 mg/dL (H)).   Assessment: 84yo female c/o fluttering and dizziness at physical therapy, noted to have irregular heart beat, EMS called and found to be in Afib w. RVR, to begin heparin.  Hep lvl within goal 0.31  Goal of Therapy:  Heparin level 0.3-0.7 units/ml Monitor platelets by anticoagulation protocol: Yes   Plan:  Continue heparin infusion at 1000 units/hr Monitor daily HL and CBC  Monitor for signs/symptoms of bleeding.  Levester Fresh, PharmD, BCPS, BCCCP Clinical Pharmacist Clinical phone for 08/26/2017 from [phone]: [phone] If after 2300, please call main pharmacy at: [phone] 08/26/2017 8:13 PM

## 2017-08-26 NOTE — Progress Notes (Signed)
  Echocardiogram 2D Echocardiogram has been performed.  Randa Lynn Laurelle Skiver 08/26/2017, 4:38 PM

## 2017-08-26 NOTE — ED Notes (Signed)
Lunch tray delivered.

## 2017-08-26 NOTE — ED Notes (Signed)
Pt transferred to hospital bed, linen and gown changed.

## 2017-08-27 DIAGNOSIS — I214 Non-ST elevation (NSTEMI) myocardial infarction: Secondary | ICD-10-CM

## 2017-08-27 DIAGNOSIS — I10 Essential (primary) hypertension: Secondary | ICD-10-CM

## 2017-08-27 DIAGNOSIS — E7800 Pure hypercholesterolemia, unspecified: Secondary | ICD-10-CM

## 2017-08-27 LAB — CBC
HCT: 35.9 % — ABNORMAL LOW (ref 36.0–46.0)
HEMOGLOBIN: 11.3 g/dL — AB (ref 12.0–15.0)
MCH: 29 pg (ref 26.0–34.0)
MCHC: 31.5 g/dL (ref 30.0–36.0)
MCV: 92.3 fL (ref 78.0–100.0)
PLATELETS: 364 10*3/uL (ref 150–400)
RBC: 3.89 MIL/uL (ref 3.87–5.11)
RDW: 14.4 % (ref 11.5–15.5)
WBC: 8.7 10*3/uL (ref 4.0–10.5)

## 2017-08-27 LAB — BASIC METABOLIC PANEL
ANION GAP: 12 (ref 5–15)
BUN: 25 mg/dL — ABNORMAL HIGH (ref 6–20)
CALCIUM: 8.3 mg/dL — AB (ref 8.9–10.3)
CO2: 22 mmol/L (ref 22–32)
Chloride: 104 mmol/L (ref 101–111)
Creatinine, Ser: 1.35 mg/dL — ABNORMAL HIGH (ref 0.44–1.00)
GFR, EST AFRICAN AMERICAN: 38 mL/min — AB (ref >60)
GFR, EST NON AFRICAN AMERICAN: 33 mL/min — AB (ref >60)
GLUCOSE: 151 mg/dL — AB (ref 65–99)
Potassium: 4.1 mmol/L (ref 3.5–5.1)
Sodium: 138 mmol/L (ref 135–145)

## 2017-08-27 LAB — HEPARIN LEVEL (UNFRACTIONATED)
Heparin Unfractionated: 0.25 IU/mL — ABNORMAL LOW (ref 0.30–0.70)
Heparin Unfractionated: 0.72 IU/mL — ABNORMAL HIGH (ref 0.30–0.70)

## 2017-08-27 MED ORDER — CLOPIDOGREL BISULFATE 75 MG PO TABS
75.0000 mg | ORAL_TABLET | Freq: Every day | ORAL | Status: DC
  Administered 2017-08-27 – 2017-08-30 (×4): 75 mg via ORAL
  Filled 2017-08-27 (×4): qty 1

## 2017-08-27 MED ORDER — METOPROLOL TARTRATE 25 MG PO TABS
25.0000 mg | ORAL_TABLET | Freq: Three times a day (TID) | ORAL | Status: AC
  Administered 2017-08-27 – 2017-08-28 (×5): 25 mg via ORAL
  Filled 2017-08-27 (×5): qty 1

## 2017-08-27 MED ORDER — DILTIAZEM HCL-DEXTROSE 100-5 MG/100ML-% IV SOLN (PREMIX)
5.0000 mg/h | INTRAVENOUS | Status: DC
  Administered 2017-08-27: 5 mg/h via INTRAVENOUS
  Administered 2017-08-27 – 2017-08-28 (×2): 10 mg/h via INTRAVENOUS
  Filled 2017-08-27 (×2): qty 100

## 2017-08-27 NOTE — Progress Notes (Signed)
PROGRESS NOTE [facility]   [name]   HUD:[id number] DOB: July 24, 1921  DOA: 08/25/2017 PCP: Marletta Lor, MD   Brief Narrative:  [name] is a 84 year old female with past medical history of hypertension, breast cancer in remission who had a recent cholecystectomy 10 days ago sent by physical therapist due to tachycardia.  Upon evaluation she was found to be in A. fib with RVR and patient was admitted for further management.  Subjective: Patient seen and examined, remains asymptomatic, but still in A. fib with RVR.  Assessment & Plan: A. fib with RVR New onset Unclear etiology but likely multifactorial from stress, new medications, ?  Viral infection CHADsVAsC 3, patient was placed on heparin drip, will defer to cardiology for oral anticoagulation selection.  Currently on Cardizem drip, cardiology recommended to wean off and start beta-blocker EKG shows A. fib with no ST segment changes.  Elevated TNI - NSTEMI , echocardiogram shows apical akinesis new from last echo performed on January.  NSTEMI Cardiology recommending conservative management, starting on beta-blocker, Plavix and statin.  No cardiac cath at this moment but will  consider if complications arise.  Advised to continue heparin for 48-72-hour and then discharged home on NOAC.  D/C ASA   Diastolic heart failure Seems to be compensated at this time Echo shows apical akinesis, grade 2 diastolic dysfunction Continue Lasix  Acute renal failure Baseline creatinine 1.12 weeks ago, upon admission 1.5 Likely prerenal Creatinine improved  Hypertension Patient does not take any medications at home BP stable, continue to monitor  DVT prophylaxis: Heparin ggt Code Status: Full code Family Communication: None at bedside Disposition Plan: Pending cardiology  clearance  Consultants:   Cardiology  Procedures:   None  Antimicrobials:  None   Objective: Vitals:   08/26/17 2201 08/26/17 2355  08/27/17 0020 08/27/17 0441  BP: (!) 112/40 (!) 141/47  (!) 137/47  Pulse:  65 63 71  Resp:  18  16  Temp:  (!) 97.5 F (36.4 C)  (!) 97.4 F (36.3 C)  TempSrc:  Oral  Oral  SpO2:  98%  97%  Weight:    74.9 kg (165 lb 3.2 oz)  Height:        Intake/Output Summary (Last 24 hours) at 08/27/2017 0945 Last data filed at 08/27/2017 0501 Gross per 24 hour  Intake -  Output 1800 ml  Net -1800 ml   Filed Weights   08/25/17 1753 08/26/17 1748 08/27/17 0441  Weight: 77.1 kg (170 lb) 76.8 kg (169 lb 4.8 oz) 74.9 kg (165 lb 3.2 oz)    Examination:  General: Pt is alert, awake, not in acute distress Cardiovascular: Irregularly irregular, S1-S2, tachycardic no murmur,  Respiratory: CTA bilaterally, no wheezing, no rhonchi Abdominal: Soft, NT, ND, bowel sounds + Extremities: no edema,     Data Reviewed: I have personally reviewed following labs and imaging studies  CBC: Recent Labs  Lab 08/25/17 1751 08/26/17 0242 08/27/17 0636  WBC 11.1* 9.5 8.7  NEUTROABS 8.7*  --   --   HGB 11.9* 10.5* 11.3*  HCT 37.6 34.2* 35.9*  MCV 92.8 92.2 92.3  PLT 428* 379 858   Basic Metabolic Panel: Recent Labs  Lab 08/25/17 1751 08/26/17 0242 08/27/17 0636  NA 141 139 138  K 4.7 4.3 4.1  CL 104 106 104  CO2 21* 21* 22  GLUCOSE 158* 126* 151*  BUN 34* 27* 25*  CREATININE 1.53* 1.27* 1.35*  CALCIUM 8.7* 8.2* 8.3*   GFR:  Estimated Creatinine Clearance: 25.2 mL/min (A) (by C-G formula based on SCr of 1.35 mg/dL (H)). Liver Function Tests: No results for input(s): AST, ALT, ALKPHOS, BILITOT, PROT, ALBUMIN in the last 168 hours. No results for input(s): LIPASE, AMYLASE in the last 168 hours. No results for input(s): AMMONIA in the last 168 hours. Coagulation Profile: No results for input(s): INR, PROTIME in the last 168 hours. Cardiac Enzymes: Recent Labs  Lab 08/25/17 1751 08/25/17 2330 08/26/17 0242 08/26/17 0934  TROPONINI 0.65* 6.39* 7.30* 4.21*   BNP (last 3 results) No  results for input(s): PROBNP in the last 8760 hours. HbA1C: No results for input(s): HGBA1C in the last 72 hours. CBG: No results for input(s): GLUCAP in the last 168 hours. Lipid Profile: No results for input(s): CHOL, HDL, LDLCALC, TRIG, CHOLHDL, LDLDIRECT in the last 72 hours. Thyroid Function Tests: Recent Labs    08/25/17 2330  TSH 3.546   Anemia Panel: No results for input(s): VITAMINB12, FOLATE, FERRITIN, TIBC, IRON, RETICCTPCT in the last 72 hours. Sepsis Labs: Recent Labs  Lab 08/25/17 2030  LATICACIDVEN 1.38    No results found for this or any previous visit (from the past 240 hour(s)).    Radiology Studies: Dg Chest 2 View  Result Date: 08/25/2017 CLINICAL DATA:  Atrial fibrillation. Shortness of breath beginning today. EXAM: CHEST  2 VIEW COMPARISON:  08/12/2017 FINDINGS: Heart size upper limits of normal. Chronic aortic atherosclerosis. Pulmonary venous hypertension with early interstitial edema. No consolidation or collapse. Tiny amount of pleural fluid in the posterior costophrenic angles. IMPRESSION: Pulmonary venous hypertension with early interstitial edema. Electronically Signed   By: Nelson Chimes M.D.   On: 08/25/2017 20:11   Ct Angio Chest Pe W Or Wo Contrast  Result Date: 08/26/2017 CLINICAL DATA:  Tachycardia with elevated D-dimer. Recent cholecystectomy. History of breast carcinoma EXAM: CT ANGIOGRAPHY CHEST WITH CONTRAST TECHNIQUE: Multidetector CT imaging of the chest was performed using the standard protocol during bolus administration of intravenous contrast. Multiplanar CT image reconstructions and MIPs were obtained to evaluate the vascular anatomy. CONTRAST:  57mL ISOVUE-370 IOPAMIDOL (ISOVUE-370) INJECTION 76% COMPARISON:  Chest radiograph August 25, 2017 FINDINGS: Cardiovascular: There is no demonstrable pulmonary embolus. There is no thoracic aortic aneurysm. No dissection is evident. It must be cautioned that the contrast bolus is not targeted toward  the aorta, in the contrast bolus in the aorta is not felt to be sufficient to exclude possible dissection from an imaging standpoint. There is aortic atherosclerosis. There is calcification in the proximal visualized great vessels as well as foci of coronary artery calcification evident. There is a small pericardial effusion. Pericardium does not appear appreciably thickened. There is mitral annular calcification. Mediastinum/Nodes: Visualized thyroid appears unremarkable. There is a lymph node to the right of the distal trachea anteriorly measuring 1.7 x 1.5 cm. There is a lymph node to the left of the carina measuring 1.8 x 1.2 cm. There are scattered subcentimeter mediastinal lymph nodes as well. No esophageal lesions are evident. Lungs/Pleura: There are moderate pleural effusions bilaterally. There is patchy airspace consolidation in posterior lung bases, largely due to compressive atelectasis. A degree of superimposed pneumonia in the bases cannot be excluded, however. There is a degree of interstitial edema in the bases as well. There is an azygos lobe on the right, an anatomic variant. Upper Abdomen: In the visualized upper abdomen, there is aortic atherosclerosis. 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 Visualized upper abdominal structures otherwise appear  unremarkable. Musculoskeletal: There is degenerative change in the thoracic spine with diffuse idiopathic skeletal hyperostosis. There is anterior wedging of the L2 vertebral body, incompletely visualized. There are no blastic or lytic bone lesions. Review of the MIP images confirms the above findings. IMPRESSION: 1.  No demonstrable pulmonary embolus. 2. No thoracic aortic aneurysm. There is extensive aortic and great vessel calcification as well as foci of coronary artery calcification. No dissection evident; note that the contrast bolus is not sufficient to exclude this entity from an imaging standpoint. 3. Pleural  effusions bilaterally with compressive atelectasis in both lung bases. There may be superimposed pneumonia in the lung bases. There is bibasilar interstitial edema. There is felt to be a degree of underlying congestive heart failure. 4. Mildly enlarged pericarinal region lymph nodes. Suspect reactive etiology given the parenchymal lung changes. 5.  Small pericardial effusion. 6. 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 Aortic Atherosclerosis (ICD10-I70.0). Electronically Signed   By: Lowella Grip III M.D.   On: 08/26/2017 17:21      Scheduled Meds: . aspirin  325 mg Oral Daily  . atorvastatin  80 mg Oral q1800  . furosemide  40 mg Oral Daily  . pantoprazole  40 mg Oral Daily   Continuous Infusions: . diltiazem (CARDIZEM) infusion 5 mg/hr (08/27/17 0607)  . heparin 1,000 Units/hr (08/26/17 2118)     LOS: 1 day    Time spent: Total of 25 minutes spent with pt, greater than 50% of which was spent in discussion of  treatment, counseling and coordination of care   Chipper Oman, MD Pager: Text Page via [email or web address]   If 7PM-7AM, please contact night-coverage [email or web address] 08/27/2017, 9:45 AM

## 2017-08-27 NOTE — Consult Note (Signed)
Consultation Note  Patient Name: [name] Date of Encounter: 08/27/2017  Primary Cardiologist: new  [name] is a 84 y.o. female who is being seen today for the evaluation of atrial fibrillation and NSTEMI at the request of Dr. Patrecia Pour   HPI   84 year old woman with a history of hypertension and remote history of breast cancer roughly 10 days following cholecystectomy, admitted for incidentally diagnosed asymptomatic atrial fibrillation rapid ventricular response during a session of physical therapy.  She was unaware of the arrhythmia.  Her ECG showed atrial fibrillation with rapid ventricular response with relatively minor repolarization changes.  Cardiac enzymes were mildly elevated and increased but have peaked at 7.3 and already improving.  Her echocardiogram shows an area of apical akinesis, new from previous echo performed in January, but overall left ventricular systolic function is well-preserved.  She converted to normal rhythm after a few hours, but atrial fibrillation recurred last night around 1700 hrs.  She remains oblivious, free of any cardiac complaints, regardless which rhythm she is in.  The patient specifically denies any chest pain at rest exertion, dyspnea at rest or with exertion, orthopnea, paroxysmal nocturnal dyspnea, syncope, palpitations, focal neurological deficits, intermittent claudication, lower extremity edema, unexplained weight gain, cough, hemoptysis or wheezing. Denies angina or dyspnea at rest or walking to the restroom.  ROS:  The patient specifically denies any chest pain at rest or with exertion, dyspnea at rest or with exertion, orthopnea, paroxysmal nocturnal dyspnea, syncope, palpitations, focal neurological deficits, intermittent claudication, lower extremity edema, unexplained weight gain, cough, hemoptysis or wheezing.  The patient also denies abdominal pain, nausea, vomiting, dysphagia, diarrhea, constipation, polyuria,  polydipsia, dysuria, hematuria, frequency, urgency, abnormal bleeding or bruising, fever, chills, unexpected weight changes, mood swings, change in skin or hair texture, change in voice quality, auditory or visual problems, allergic reactions or rashes, new musculoskeletal complaints other than usual "aches and pains".    Past Medical History:  Diagnosis Date  . Allergic rhinitis, seasonal   . Breast cancer of upper-outer quadrant of right female breast ([place]) 09/13/2014  . Chronic venous insufficiency   . Dermatophytosis of nail   . Edema leg   . Family history of malignant neoplasm of breast   . Family history of malignant neoplasm of ovary   . Goiter    right thyroid nodule   . HTN (hypertension) 08/13/2017  . HX: breast cancer    bilateral  . Menopausal syndrome   . Osteoarthritis   . Osteoporosis   . Squamous cell skin cancer    right lower leg  . Wears glasses   . Wears hearing aid    both ears   Past Surgical History:  Procedure Laterality Date  . bilateral lumpectomies for breast cancer  Aug. 2007  . BREAST LUMPECTOMY WITH RADIOACTIVE SEED LOCALIZATION Right 10/01/2014   Procedure: BREAST LUMPECTOMY WITH RADIOACTIVE SEED LOCALIZATION;  Surgeon: Erroll Luna, MD;  Location: [facility];  Service: General;  Laterality: Right;  . CATARACT EXTRACTION    . CHOLECYSTECTOMY N/A 08/13/2017   Procedure: LAPAROSCOPIC CHOLECYSTECTOMY WITH INTRAOPERATIVE CHOLANGIOGRAM;  Surgeon: Excell Seltzer, MD;  Location: [facility];  Service: General;  Laterality: N/A;  . DILATION AND CURETTAGE OF UTERUS    . ESOPHAGOGASTRODUODENOSCOPY  07/26/2011   Procedure: ESOPHAGOGASTRODUODENOSCOPY (EGD);  Surgeon: Jeryl Columbia, MD;  Location: [facility];  Service: Endoscopy;  Laterality: N/A;  . history of lumbar compression fracture    . left eardum sx  1983  .  left knee arthroscopic surgery    . no screening colonoscopy    . s/p bilateral lumpectomies    . SAVORY DILATION  07/26/2011    Procedure: SAVORY DILATION;  Surgeon: Jeryl Columbia, MD;  Location: [facility];  Service: Endoscopy;  Laterality: N/A;  . status post resection squamous cell cancer right lower leg    . TONSILLECTOMY    . UMBILICAL HERNIA REPAIR     Family History  Problem Relation Age of Onset  . Cancer Mother 62       breast  . Heart attack Father   . Heart attack Brother   . Heart attack Brother   . Cancer Maternal Aunt 55       breast  . Coronary artery disease Other   . Cancer Other        ovarian; daughter of mat aunt w/ BC in 31s  . Heart disease Brother   . Cancer Maternal Aunt 60       breast     Inpatient Medications    Scheduled Meds: . aspirin  325 mg Oral Daily  . atorvastatin  80 mg Oral q1800  . furosemide  40 mg Oral Daily  . metoprolol tartrate  25 mg Oral TID  . pantoprazole  40 mg Oral Daily   Continuous Infusions: . diltiazem (CARDIZEM) infusion 5 mg/hr (08/27/17 0607)  . heparin 1,000 Units/hr (08/26/17 2118)   PRN Meds: acetaminophen **OR** acetaminophen, ondansetron **OR** ondansetron (ZOFRAN) IV   Vital Signs    Vitals:   08/26/17 2355 08/27/17 0020 08/27/17 0441 08/27/17 1031  BP: (!) 141/47  (!) 137/47 (!) 109/54  Pulse: 65 63 71 (!) 112  Resp: 18  16   Temp: (!) 97.5 F (36.4 C)  (!) 97.4 F (36.3 C)   TempSrc: Oral  Oral   SpO2: 98%  97%   Weight:   165 lb 3.2 oz (74.9 kg)   Height:        Intake/Output Summary (Last 24 hours) at 08/27/2017 1125 Last data filed at 08/27/2017 0900 Gross per 24 hour  Intake 240 ml  Output 2000 ml  Net -1760 ml   Filed Weights   08/25/17 1753 08/26/17 1748 08/27/17 0441  Weight: 170 lb (77.1 kg) 169 lb 4.8 oz (76.8 kg) 165 lb 3.2 oz (74.9 kg)    Telemetry    AFib w RVR - Personally Reviewed  ECG    AFib w RVR, frequent PVCs, lateral ST depression - Personally Reviewed  Physical Exam  Looks very comfortable GEN: No acute distress.   Neck: No JVD Cardiac: irregular, louder S2, no murmurs, rubs, or  gallops.  Respiratory: Clear to auscultation bilaterally. GI: Soft, nontender, non-distended  MS: No edema; No deformity. Neuro:  Nonfocal  Psych: Normal affect   Labs    Chemistry Recent Labs  Lab 08/25/17 1751 08/26/17 0242 08/27/17 0636  NA 141 139 138  K 4.7 4.3 4.1  CL 104 106 104  CO2 21* 21* 22  GLUCOSE 158* 126* 151*  BUN 34* 27* 25*  CREATININE 1.53* 1.27* 1.35*  CALCIUM 8.7* 8.2* 8.3*  GFRNONAA 28* 35* 33*  GFRAA 33* 41* 38*  ANIONGAP 16* 12 12     Hematology Recent Labs  Lab 08/25/17 1751 08/26/17 0242 08/27/17 0636  WBC 11.1* 9.5 8.7  RBC 4.05 3.71* 3.89  HGB 11.9* 10.5* 11.3*  HCT 37.6 34.2* 35.9*  MCV 92.8 92.2 92.3  MCH 29.4 28.3 29.0  MCHC 31.6 30.7 31.5  RDW 14.1 14.3 14.4  PLT 428* 379 364    Cardiac Enzymes Recent Labs  Lab 08/25/17 1751 08/25/17 2330 08/26/17 0242 08/26/17 0934  TROPONINI 0.65* 6.39* 7.30* 4.21*   No results for input(s): TROPIPOC in the last 168 hours.   BNPNo results for input(s): BNP, PROBNP in the last 168 hours.   DDimer  Recent Labs  Lab 08/25/17 2330  DDIMER 2.48*     Radiology    Dg Chest 2 View  Result Date: 08/25/2017 CLINICAL DATA:  Atrial fibrillation. Shortness of breath beginning today. EXAM: CHEST  2 VIEW COMPARISON:  08/12/2017 FINDINGS: Heart size upper limits of normal. Chronic aortic atherosclerosis. Pulmonary venous hypertension with early interstitial edema. No consolidation or collapse. Tiny amount of pleural fluid in the posterior costophrenic angles. IMPRESSION: Pulmonary venous hypertension with early interstitial edema. Electronically Signed   By: Nelson Chimes M.D.   On: 08/25/2017 20:11   Ct Angio Chest Pe W Or Wo Contrast  Result Date: 08/26/2017 CLINICAL DATA:  Tachycardia with elevated D-dimer. Recent cholecystectomy. History of breast carcinoma EXAM: CT ANGIOGRAPHY CHEST WITH CONTRAST TECHNIQUE: Multidetector CT imaging of the chest was performed using the standard protocol during  bolus administration of intravenous contrast. Multiplanar CT image reconstructions and MIPs were obtained to evaluate the vascular anatomy. CONTRAST:  50mL ISOVUE-370 IOPAMIDOL (ISOVUE-370) INJECTION 76% COMPARISON:  Chest radiograph August 25, 2017 FINDINGS: Cardiovascular: There is no demonstrable pulmonary embolus. There is no thoracic aortic aneurysm. No dissection is evident. It must be cautioned that the contrast bolus is not targeted toward the aorta, in the contrast bolus in the aorta is not felt to be sufficient to exclude possible dissection from an imaging standpoint. There is aortic atherosclerosis. There is calcification in the proximal visualized great vessels as well as foci of coronary artery calcification evident. There is a small pericardial effusion. Pericardium does not appear appreciably thickened. There is mitral annular calcification. Mediastinum/Nodes: Visualized thyroid appears unremarkable. There is a lymph node to the right of the distal trachea anteriorly measuring 1.7 x 1.5 cm. There is a lymph node to the left of the carina measuring 1.8 x 1.2 cm. There are scattered subcentimeter mediastinal lymph nodes as well. No esophageal lesions are evident. Lungs/Pleura: There are moderate pleural effusions bilaterally. There is patchy airspace consolidation in posterior lung bases, largely due to compressive atelectasis. A degree of superimposed pneumonia in the bases cannot be excluded, however. There is a degree of interstitial edema in the bases as well. There is an azygos lobe on the right, an anatomic variant. Upper Abdomen: In the visualized upper abdomen, there is aortic atherosclerosis. 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 Visualized upper abdominal structures otherwise appear unremarkable. Musculoskeletal: There is degenerative change in the thoracic spine with diffuse idiopathic skeletal hyperostosis. There is anterior wedging of the L2 vertebral  body, incompletely visualized. There are no blastic or lytic bone lesions. Review of the MIP images confirms the above findings. IMPRESSION: 1.  No demonstrable pulmonary embolus. 2. No thoracic aortic aneurysm. There is extensive aortic and great vessel calcification as well as foci of coronary artery calcification. No dissection evident; note that the contrast bolus is not sufficient to exclude this entity from an imaging standpoint. 3. Pleural effusions bilaterally with compressive atelectasis in both lung bases. There may be superimposed pneumonia in the lung bases. There is bibasilar interstitial edema. There is felt to be a degree of underlying congestive heart failure. 4. Mildly enlarged  pericarinal region lymph nodes. Suspect reactive etiology given the parenchymal lung changes. 5.  Small pericardial effusion. 6. 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 Aortic Atherosclerosis (ICD10-I70.0). Electronically Signed   By: Lowella Grip III M.D.   On: 08/26/2017 17:21    Cardiac Studies   Echo 08/26/2017  - Left ventricle: The cavity size was normal. Wall thickness was   increased in a pattern of mild LVH. Systolic function was normal.   The estimated ejection fraction was in the range of 50% to 55%.   There is akinesis of the apical myocardium. Features are   consistent with a pseudonormal left ventricular filling pattern,   with concomitant abnormal relaxation and increased filling   pressure (grade 2 diastolic dysfunction). - Aortic valve: Trileaflet; mildly thickened, mildly calcified   leaflets. There was mild regurgitation. - Mitral valve: Severely calcified annulus. Mildly thickened,   mildly calcified leaflets . There was moderate regurgitation. - Left atrium: The atrium was mildly to moderately dilated.   Volume/bsa, S: 43.1 ml/m^2. - Tricuspid valve: There was moderate  regurgitation. - Pulmonary arteries: Systolic pressure was moderately increased.  Impressions:  - Apical akinesis is new when compared to prior study.  Patient Profile     84 y.o. female with small apical NSTEMI, without angina or clinical signs of CHF, in the setting of recurrent paroxysmal atrial fibrillation with RVR  Assessment & Plan    1. NSTEMI: small, with minimal effect on overall LV function and without overt CHF or serious ventricular arrhythmia. Advised conservative management - add beta blocker, clopidogrel, statin, but can proceed to invasive angiography if complications arise. I suspect that she may have a high grade mid LAD stenosis and that extreme tachycardia during AFib caused a combination of stunning and permanent injury in the LV apex. Would not be surprised to see apical wall motion improve with time. Doubt that she had MI due to ruptured plaque. Continue IV heparin for 48-72 hours, but plan to DC with oral anticoagulant and a course of clopidogrel of up to 12 months.  We will stop aspirin due to increased risk of GI bleeding. 2. AF w RVR:  On iv diltiazem. Transition to beta blocker. No indication for antiarrhythmics since she is unaware of the arrhythmia and I suspect we will be able to achieve reasonable rate control. 3. HTN: Currently well controlled, in fact blood pressure is relatively low which may limit the use of rate control medications.  Despite the fact that she carries a diagnosis of hypertension, she was not taking any medications for this at home other than an occasional dose of loop diuretic. 4. HLP: Does not have a recent lipid profile, but in 2014 her total cholesterol was 245 and her LDL was 174.  Will start a statin.  Target LDL under 70.  For questions or updates, please contact [place] Please consult [email or web address] for contact info under Cardiology/STEMI.      Signed, Sanda Klein, MD  08/27/2017, 11:25 AM

## 2017-08-27 NOTE — Progress Notes (Signed)
[facility] Cardiologist paged patient had runs of VTach 3.20 sec pause HR 130-140 Cardizem 30 mg po given at 0452. This occurred after patient ambulated to bathroom. EKG was done. Stripps are saved in chart.

## 2017-08-27 NOTE — Progress Notes (Signed)
Pt heart rate sustaining at 130s, pt nonsymptomatic  Paged MD to inform

## 2017-08-27 NOTE — Progress Notes (Signed)
Patient in sinus bradycardia on the monitor trending in the 50s.  Patient having lunch.  Family at bedside.  Denies any discomfort.  PA Meng on call notified.  PA states to notify him if heart rates goes below 50.  Nurse will monitor patient. Marcille Blanco, RN

## 2017-08-27 NOTE — Progress Notes (Signed)
PA for cardiology aware of HR sustaining  No new orders at this time

## 2017-08-27 NOTE — Progress Notes (Signed)
Cardizem gtt was restarted at 5mg  @0607  will continue to monitor. Patient is asymptomatic resting in the bed informed her that she will not be getting up to ambulate to bathroom. Arthor Captain LPN

## 2017-08-27 NOTE — Progress Notes (Signed)
ANTICOAGULATION CONSULT NOTE - Follow Up Consult  Pharmacy Consult for heparin Indication: atrial fibrillation and NSTEMI  Allergies  Allergen Reactions  . Celecoxib Swelling    REACTION: Swelling-leg    Patient Measurements: Height: 5\' 3"  (160 cm) Weight: 165 lb 3.2 oz (74.9 kg) IBW/kg (Calculated) : 52.4 Heparin Dosing Weight: 65 kg  Vital Signs: Temp: 97.4 F (36.3 C) (02/09 0441) Temp Source: Oral (02/09 0441) BP: 128/56 (02/09 1217) Pulse Rate: 95 (02/09 1217)  Labs: Recent Labs    08/25/17 1751 08/25/17 2330 08/26/17 0242 08/26/17 0820 08/26/17 0934 08/26/17 1852 08/27/17 0636  HGB 11.9*  --  10.5*  --   --   --  11.3*  HCT 37.6  --  34.2*  --   --   --  35.9*  PLT 428*  --  379  --   --   --  364  HEPARINUNFRC  --   --   --  0.47  --  0.31 0.25*  CREATININE 1.53*  --  1.27*  --   --   --  1.35*  TROPONINI 0.65* 6.39* 7.30*  --  4.21*  --   --     Estimated Creatinine Clearance: 25.2 mL/min (A) (by C-G formula based on SCr of 1.35 mg/dL (H)).  Assessment:  84 yr old female continues on IV heparin for atrial fibrillation and NSTEMI.  Heparin levels were therapeutic (0.47>0.31) yesterday on 1000 units/hr, but further trend down to 0.25 today. Just below goal.  Goal of Therapy:  Heparin level 0.3-0.7 units/ml Monitor platelets by anticoagulation protocol: Yes   Plan:   Increase heparin drip to 1150 units/hr  Heparin level ~8 hrs after rate change.  Daily heparin level and CBC.  Arty Baumgartner, [place] Pager: [phone] or [phone] 08/27/2017,2:14 PM

## 2017-08-28 LAB — BASIC METABOLIC PANEL
Anion gap: 11 (ref 5–15)
BUN: 24 mg/dL — ABNORMAL HIGH (ref 6–20)
CALCIUM: 8 mg/dL — AB (ref 8.9–10.3)
CHLORIDE: 103 mmol/L (ref 101–111)
CO2: 23 mmol/L (ref 22–32)
Creatinine, Ser: 1.36 mg/dL — ABNORMAL HIGH (ref 0.44–1.00)
GFR calc Af Amer: 38 mL/min — ABNORMAL LOW (ref >60)
GFR calc non Af Amer: 32 mL/min — ABNORMAL LOW (ref >60)
GLUCOSE: 135 mg/dL — AB (ref 65–99)
Potassium: 4 mmol/L (ref 3.5–5.1)
Sodium: 137 mmol/L (ref 135–145)

## 2017-08-28 LAB — HEPARIN LEVEL (UNFRACTIONATED): Heparin Unfractionated: 0.86 IU/mL — ABNORMAL HIGH (ref 0.30–0.70)

## 2017-08-28 MED ORDER — APIXABAN 5 MG PO TABS
5.0000 mg | ORAL_TABLET | Freq: Two times a day (BID) | ORAL | Status: DC
  Administered 2017-08-28 – 2017-08-30 (×5): 5 mg via ORAL
  Filled 2017-08-28 (×5): qty 1

## 2017-08-28 MED ORDER — GUAIFENESIN-DM 100-10 MG/5ML PO SYRP
5.0000 mL | ORAL_SOLUTION | ORAL | Status: DC | PRN
  Administered 2017-08-28 – 2017-08-29 (×2): 5 mL via ORAL
  Filled 2017-08-28 (×2): qty 5

## 2017-08-28 MED ORDER — METOPROLOL TARTRATE 50 MG PO TABS
50.0000 mg | ORAL_TABLET | Freq: Two times a day (BID) | ORAL | Status: DC
Start: 2017-08-28 — End: 2017-08-29
  Administered 2017-08-28 – 2017-08-29 (×2): 50 mg via ORAL
  Filled 2017-08-28 (×2): qty 1

## 2017-08-28 NOTE — Discharge Instructions (Signed)

## 2017-08-28 NOTE — Progress Notes (Addendum)
ANTICOAGULATION CONSULT NOTE - Follow Up Consult  Pharmacy Consult for heparin Indication: atrial fibrillation/NSTEMI  Allergies  Allergen Reactions  . Celecoxib Swelling    REACTION: Swelling-leg    Patient Measurements: Height: 5\' 3"  (160 cm) Weight: 167 lb 8.8 oz (76 kg) IBW/kg (Calculated) : 52.4   Vital Signs: Temp: 98.1 F (36.7 C) (02/10 0603) Temp Source: Oral (02/10 0603) BP: 133/43 (02/10 0603) Pulse Rate: 65 (02/10 0603)  Labs: Recent Labs    08/25/17 1751 08/25/17 2330 08/26/17 0242  08/26/17 0934  08/27/17 0636 08/27/17 2323 08/28/17 0746  HGB 11.9*  --  10.5*  --   --   --  11.3*  --   --   HCT 37.6  --  34.2*  --   --   --  35.9*  --   --   PLT 428*  --  379  --   --   --  364  --   --   HEPARINUNFRC  --   --   --    < >  --    < > 0.25* 0.72* 0.86*  CREATININE 1.53*  --  1.27*  --   --   --  1.35*  --  1.36*  TROPONINI 0.65* 6.39* 7.30*  --  4.21*  --   --   --   --    < > = values in this interval not displayed.    Estimated Creatinine Clearance: 25.2 mL/min (A) (by C-G formula based on SCr of 1.36 mg/dL (H)).   Medications:  Scheduled:  . atorvastatin  80 mg Oral q1800  . clopidogrel  75 mg Oral Daily  . furosemide  40 mg Oral Daily  . metoprolol tartrate  25 mg Oral TID  . pantoprazole  40 mg Oral Daily   Infusions:  . diltiazem (CARDIZEM) infusion 10 mg/hr (08/28/17 0217)  . heparin 1,100 Units/hr (08/28/17 0011)    Assessment: 84 y/o female on heparin for Afib/NSTEMI. Heparin level is above goal this morning 0.86 despite rate decrease overnight. CBC stable, no bleeding issues per RN. Cardiology will likely transition to oral anticoagulant over next 24 hrs.   Goal of Therapy:  Heparin level 0.3-0.7 units/ml Monitor platelets by anticoagulation protocol: Yes   Plan:  Decrease heparin to 950 units/hr Check 8-hour heparin level Daily Heparin level, CBC while on heparin Monitor s/sx of bleeding Follow up switch to oral  [facility]   Charlene Brooke, PharmD PGY1 Pharmacy Resident Phone: [phone] After 3:30PM please call [place] [phone] 08/28/2017,8:58 AM

## 2017-08-28 NOTE — Progress Notes (Signed)
ANTICOAGULATION CONSULT NOTE - Follow Up Consult  Pharmacy Consult for heparin-> Eliquis Indication: atrial fibrillation and NSTEMI  Allergies  Allergen Reactions  . Celecoxib Swelling    REACTION: Swelling-leg    Patient Measurements: Height: 5\' 3"  (160 cm) Weight: 167 lb 8.8 oz (76 kg) IBW/kg (Calculated) : 52.4 Heparin Dosing Weight: 65 kg  Vital Signs: Temp: 97.1 F (36.2 C) (02/10 0900) Temp Source: Oral (02/10 0900) BP: 107/42 (02/10 0914) Pulse Rate: 54 (02/10 0914)  Labs: Recent Labs    08/25/17 1751 08/25/17 2330 08/26/17 0242  08/26/17 0934  08/27/17 0636 08/27/17 2323 08/28/17 0746  HGB 11.9*  --  10.5*  --   --   --  11.3*  --   --   HCT 37.6  --  34.2*  --   --   --  35.9*  --   --   PLT 428*  --  379  --   --   --  364  --   --   HEPARINUNFRC  --   --   --    < >  --    < > 0.25* 0.72* 0.86*  CREATININE 1.53*  --  1.27*  --   --   --  1.35*  --  1.36*  TROPONINI 0.65* 6.39* 7.30*  --  4.21*  --   --   --   --    < > = values in this interval not displayed.    Estimated Creatinine Clearance: 25.2 mL/min (A) (by C-G formula based on SCr of 1.36 mg/dL (H)).  Assessment:  84 yr old female on IV heparin for atrial fibrillation and NSTEMI for ~72 hours.  Now changing to Eliquis for afib.  84 yrs old, but >60 kg and Scr <1.5 so qualifies for full-dose. Plavix starting today for up to 12 months.  No ASA.  Goal of Therapy:  Heparin level 0.3-0.7 units/ml appropriate Eliquis dose for indication Monitor platelets by anticoagulation protocol: Yes   Plan:   Begin Eliquis 5 mg PO BID.  Stop IV heparin.    Arty Baumgartner, [place] Pager: [phone] or [phone] 08/28/2017,12:55 PM

## 2017-08-28 NOTE — Progress Notes (Signed)
PROGRESS NOTE [facility]   [name]   CZY:[id number] DOB: [birth year]  DOA: 08/25/2017 PCP: Marletta Lor, MD   Brief Narrative:  [name] is a 84 year old female with past medical history of hypertension, breast cancer in remission who had a recent cholecystectomy 10 days ago sent by physical therapist due to tachycardia.  Upon evaluation she was found to be in A. fib with RVR and patient was admitted for further management.  Subjective: She is seen and examined this morning, feeling much better.  Denies chest pain, shortness of breath and palpitation.  Heart rate is well controlled, she has been sinus rhythm most night, brief episode of A. fib with rate control but spontaneous back to NSR  Assessment & Plan: A. fib with RVR - rhythm improved New onset Likely due to NSTEMI , echocardiogram shows apical akinesis new from last echo performed on January.  She initially treated with Cardizem drip, now transitioned to beta-blocker, metoprolol 50 mg twice daily CHADsVAsC 3, she initially treated with heparin drip, transitioned to Eliquis. Monitor rhythm with ambulation  Elevated TNI - NSTEMI Cardiology recommending conservative management, starting on beta-blocker, Plavix and statin.  No cardiac cath at this moment.  Apparent drip discontinued, transitioned to Eliquis.  No ASA given risks of GI bleed  Diastolic heart failure - stable Seems to be compensated at this time Echo shows apical akinesis, grade 2 diastolic dysfunction Continue Lasix  Acute renal failure Baseline creatinine 1.12 weeks ago, upon admission 1.5 Likely prerenal Creatinine seems to have plateaued at around 1.3 Continue to monitor  Hypertension Was not taking any medications at home but now started on beta-blocker for A. fib BP stable, continue to monitor  DVT prophylaxis: Heparin ggt Code Status: Full code Family Communication: None at bedside Disposition Plan: Home in the next  24-hour  Consultants:   Cardiology  Procedures:   None  Antimicrobials:  None   Objective: Vitals:   08/28/17 0028 08/28/17 0603 08/28/17 0900 08/28/17 0914  BP: (!) 112/49 (!) 133/43 (!) 111/39 (!) 107/42  Pulse: 63 65 62 (!) 54  Resp:  18 18   Temp: 98.7 F (37.1 C) 98.1 F (36.7 C) (!) 97.1 F (36.2 C)   TempSrc: Oral Oral Oral   SpO2: 95% 96% 97%   Weight:  76 kg (167 lb 8.8 oz)    Height:        Intake/Output Summary (Last 24 hours) at 08/28/2017 1442 Last data filed at 08/28/2017 1100 Gross per 24 hour  Intake 769.11 ml  Output 1851 ml  Net -1081.89 ml   Filed Weights   08/26/17 1748 08/27/17 0441 08/28/17 0603  Weight: 76.8 kg (169 lb 4.8 oz) 74.9 kg (165 lb 3.2 oz) 76 kg (167 lb 8.8 oz)    Examination:  GGeneral: Pt is alert, awake, not in acute distress Cardiovascular: RRR, S1/S2 +, no rubs, no gallops Respiratory: CTA bilaterally, no wheezing, no rhonchi Abdominal: Soft, NT, ND, + bowel sounds. surgical incision intact and clean Extremities: no edema.   Data Reviewed: I have personally reviewed following labs and imaging studies  CBC: Recent Labs  Lab 08/25/17 1751 08/26/17 0242 08/27/17 0636  WBC 11.1* 9.5 8.7  NEUTROABS 8.7*  --   --   HGB 11.9* 10.5* 11.3*  HCT 37.6 34.2* 35.9*  MCV 92.8 92.2 92.3  PLT 428* 379 093   Basic Metabolic Panel: Recent Labs  Lab 08/25/17 1751 08/26/17 0242 08/27/17 0636 08/28/17 0746  NA 141 139  138 137  K 4.7 4.3 4.1 4.0  CL 104 106 104 103  CO2 21* 21* 22 23  GLUCOSE 158* 126* 151* 135*  BUN 34* 27* 25* 24*  CREATININE 1.53* 1.27* 1.35* 1.36*  CALCIUM 8.7* 8.2* 8.3* 8.0*   GFR: Estimated Creatinine Clearance: 25.2 mL/min (A) (by C-G formula based on SCr of 1.36 mg/dL (H)). Liver Function Tests: No results for input(s): AST, ALT, ALKPHOS, BILITOT, PROT, ALBUMIN in the last 168 hours. No results for input(s): LIPASE, AMYLASE in the last 168 hours. No results for input(s): AMMONIA in the last  168 hours. Coagulation Profile: No results for input(s): INR, PROTIME in the last 168 hours. Cardiac Enzymes: Recent Labs  Lab 08/25/17 1751 08/25/17 2330 08/26/17 0242 08/26/17 0934  TROPONINI 0.65* 6.39* 7.30* 4.21*   BNP (last 3 results) No results for input(s): PROBNP in the last 8760 hours. HbA1C: No results for input(s): HGBA1C in the last 72 hours. CBG: No results for input(s): GLUCAP in the last 168 hours. Lipid Profile: No results for input(s): CHOL, HDL, LDLCALC, TRIG, CHOLHDL, LDLDIRECT in the last 72 hours. Thyroid Function Tests: Recent Labs    08/25/17 2330  TSH 3.546   Anemia Panel: No results for input(s): VITAMINB12, FOLATE, FERRITIN, TIBC, IRON, RETICCTPCT in the last 72 hours. Sepsis Labs: Recent Labs  Lab 08/25/17 2030  LATICACIDVEN 1.38    No results found for this or any previous visit (from the past 240 hour(s)).    Radiology Studies: Ct Angio Chest Pe W Or Wo Contrast  Result Date: 08/26/2017 CLINICAL DATA:  Tachycardia with elevated D-dimer. Recent cholecystectomy. History of breast carcinoma EXAM: CT ANGIOGRAPHY CHEST WITH CONTRAST TECHNIQUE: Multidetector CT imaging of the chest was performed using the standard protocol during bolus administration of intravenous contrast. Multiplanar CT image reconstructions and MIPs were obtained to evaluate the vascular anatomy. CONTRAST:  57mL ISOVUE-370 IOPAMIDOL (ISOVUE-370) INJECTION 76% COMPARISON:  Chest radiograph August 25, 2017 FINDINGS: Cardiovascular: There is no demonstrable pulmonary embolus. There is no thoracic aortic aneurysm. No dissection is evident. It must be cautioned that the contrast bolus is not targeted toward the aorta, in the contrast bolus in the aorta is not felt to be sufficient to exclude possible dissection from an imaging standpoint. There is aortic atherosclerosis. There is calcification in the proximal visualized great vessels as well as foci of coronary artery calcification  evident. There is a small pericardial effusion. Pericardium does not appear appreciably thickened. There is mitral annular calcification. Mediastinum/Nodes: Visualized thyroid appears unremarkable. There is a lymph node to the right of the distal trachea anteriorly measuring 1.7 x 1.5 cm. There is a lymph node to the left of the carina measuring 1.8 x 1.2 cm. There are scattered subcentimeter mediastinal lymph nodes as well. No esophageal lesions are evident. Lungs/Pleura: There are moderate pleural effusions bilaterally. There is patchy airspace consolidation in posterior lung bases, largely due to compressive atelectasis. A degree of superimposed pneumonia in the bases cannot be excluded, however. There is a degree of interstitial edema in the bases as well. There is an azygos lobe on the right, an anatomic variant. Upper Abdomen: In the visualized upper abdomen, there is aortic atherosclerosis. 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 Visualized upper abdominal structures otherwise appear unremarkable. Musculoskeletal: There is degenerative change in the thoracic spine with diffuse idiopathic skeletal hyperostosis. There is anterior wedging of the L2 vertebral body, incompletely visualized. There are no blastic or lytic bone lesions.  Review of the MIP images confirms the above findings. IMPRESSION: 1.  No demonstrable pulmonary embolus. 2. No thoracic aortic aneurysm. There is extensive aortic and great vessel calcification as well as foci of coronary artery calcification. No dissection evident; note that the contrast bolus is not sufficient to exclude this entity from an imaging standpoint. 3. Pleural effusions bilaterally with compressive atelectasis in both lung bases. There may be superimposed pneumonia in the lung bases. There is bibasilar interstitial edema. There is felt to be a degree of underlying congestive heart failure. 4. Mildly enlarged pericarinal region lymph nodes.  Suspect reactive etiology given the parenchymal lung changes. 5.  Small pericardial effusion. 6. 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 Aortic Atherosclerosis (ICD10-I70.0). Electronically Signed   By: Lowella Grip III M.D.   On: 08/26/2017 17:21    Scheduled Meds: . apixaban  5 mg Oral BID  . atorvastatin  80 mg Oral q1800  . clopidogrel  75 mg Oral Daily  . furosemide  40 mg Oral Daily  . metoprolol tartrate  25 mg Oral TID  . metoprolol tartrate  50 mg Oral BID  . pantoprazole  40 mg Oral Daily   Continuous Infusions:    LOS: 2 days    Time spent: Total of 25 minutes spent with pt, greater than 50% of which was spent in discussion of  treatment, counseling and coordination of care   Chipper Oman, MD Pager: Text Page via [email or web address]   If 7PM-7AM, please contact night-coverage [email or web address] 08/28/2017, 2:42 PM

## 2017-08-28 NOTE — Progress Notes (Signed)
ANTICOAGULATION CONSULT NOTE - Follow Up Consult  Pharmacy Consult for heparin Indication: Afib and NSTEMI  Labs: Recent Labs    08/25/17 1751 08/25/17 2330 08/26/17 0242  08/26/17 0934 08/26/17 1852 08/27/17 0636 08/27/17 2323  HGB 11.9*  --  10.5*  --   --   --  11.3*  --   HCT 37.6  --  34.2*  --   --   --  35.9*  --   PLT 428*  --  379  --   --   --  364  --   HEPARINUNFRC  --   --   --    < >  --  0.31 0.25* 0.72*  CREATININE 1.53*  --  1.27*  --   --   --  1.35*  --   TROPONINI 0.65* 6.39* 7.30*  --  4.21*  --   --   --    < > = values in this interval not displayed.    Assessment: 84yo female now above goal on heparin after rate increase.  Goal of Therapy:  Heparin level 0.3-0.7 units/ml   Plan:  Will decrease heparin gtt slightly to 1100 units/hr and check level in 8 hours.    Wynona Neat, PharmD, BCPS  08/28/2017,12:09 AM

## 2017-08-28 NOTE — Progress Notes (Signed)
Progress Note  Patient Name: [name] Date of Encounter: 08/28/2017  Primary Cardiologist: No primary care provider on file.   Subjective   Feels well.  Back in normal sinus rhythm most of the last 24 hours.  Had brief recurrence of fibrillation with controlled ventricular rate this morning, rate control was adequate.  Inpatient Medications    Scheduled Meds: . atorvastatin  80 mg Oral q1800  . clopidogrel  75 mg Oral Daily  . furosemide  40 mg Oral Daily  . metoprolol tartrate  25 mg Oral TID  . pantoprazole  40 mg Oral Daily   Continuous Infusions: . diltiazem (CARDIZEM) infusion 10 mg/hr (08/28/17 0217)  . heparin 950 Units/hr (08/28/17 0909)   PRN Meds: acetaminophen **OR** acetaminophen, ondansetron **OR** ondansetron (ZOFRAN) IV   Vital Signs    Vitals:   08/28/17 0028 08/28/17 0603 08/28/17 0900 08/28/17 0914  BP: (!) 112/49 (!) 133/43 (!) 111/39 (!) 107/42  Pulse: 63 65 62 (!) 54  Resp:  18 18   Temp: 98.7 F (37.1 C) 98.1 F (36.7 C) (!) 97.1 F (36.2 C)   TempSrc: Oral Oral Oral   SpO2: 95% 96% 97%   Weight:  167 lb 8.8 oz (76 kg)    Height:        Intake/Output Summary (Last 24 hours) at 08/28/2017 1234 Last data filed at 08/28/2017 1100 Gross per 24 hour  Intake 769.11 ml  Output 1851 ml  Net -1081.89 ml   Filed Weights   08/26/17 1748 08/27/17 0441 08/28/17 0603  Weight: 169 lb 4.8 oz (76.8 kg) 165 lb 3.2 oz (74.9 kg) 167 lb 8.8 oz (76 kg)    Telemetry    Virtually all normal sinus rhythm, brief chill fibrillation with controlled rate- Personally Reviewed  ECG    No new tracing- Personally Reviewed  Physical Exam  Looks very comfortable GEN: No acute distress.   Neck: No JVD Cardiac: RRR, no murmurs, rubs, or gallops.  Respiratory: Clear to auscultation bilaterally. GI: Soft, nontender, non-distended  MS: No edema; No deformity. Neuro:  Nonfocal  Psych: Normal affect   Labs    Chemistry Recent Labs  Lab 08/26/17 0242  08/27/17 0636 08/28/17 0746  NA 139 138 137  K 4.3 4.1 4.0  CL 106 104 103  CO2 21* 22 23  GLUCOSE 126* 151* 135*  BUN 27* 25* 24*  CREATININE 1.27* 1.35* 1.36*  CALCIUM 8.2* 8.3* 8.0*  GFRNONAA 35* 33* 32*  GFRAA 41* 38* 38*  ANIONGAP 12 12 11      Hematology Recent Labs  Lab 08/25/17 1751 08/26/17 0242 08/27/17 0636  WBC 11.1* 9.5 8.7  RBC 4.05 3.71* 3.89  HGB 11.9* 10.5* 11.3*  HCT 37.6 34.2* 35.9*  MCV 92.8 92.2 92.3  MCH 29.4 28.3 29.0  MCHC 31.6 30.7 31.5  RDW 14.1 14.3 14.4  PLT 428* 379 364    Cardiac Enzymes Recent Labs  Lab 08/25/17 1751 08/25/17 2330 08/26/17 0242 08/26/17 0934  TROPONINI 0.65* 6.39* 7.30* 4.21*   No results for input(s): TROPIPOC in the last 168 hours.   BNPNo results for input(s): BNP, PROBNP in the last 168 hours.   DDimer  Recent Labs  Lab 08/25/17 2330  DDIMER 2.48*     Radiology    Ct Angio Chest Pe W Or Wo Contrast  Result Date: 08/26/2017 CLINICAL DATA:  Tachycardia with elevated D-dimer. Recent cholecystectomy. History of breast carcinoma EXAM: CT ANGIOGRAPHY CHEST WITH CONTRAST TECHNIQUE: Multidetector CT imaging  of the chest was performed using the standard protocol during bolus administration of intravenous contrast. Multiplanar CT image reconstructions and MIPs were obtained to evaluate the vascular anatomy. CONTRAST:  67mL ISOVUE-370 IOPAMIDOL (ISOVUE-370) INJECTION 76% COMPARISON:  Chest radiograph August 25, 2017 FINDINGS: Cardiovascular: There is no demonstrable pulmonary embolus. There is no thoracic aortic aneurysm. No dissection is evident. It must be cautioned that the contrast bolus is not targeted toward the aorta, in the contrast bolus in the aorta is not felt to be sufficient to exclude possible dissection from an imaging standpoint. There is aortic atherosclerosis. There is calcification in the proximal visualized great vessels as well as foci of coronary artery calcification evident. There is a small  pericardial effusion. Pericardium does not appear appreciably thickened. There is mitral annular calcification. Mediastinum/Nodes: Visualized thyroid appears unremarkable. There is a lymph node to the right of the distal trachea anteriorly measuring 1.7 x 1.5 cm. There is a lymph node to the left of the carina measuring 1.8 x 1.2 cm. There are scattered subcentimeter mediastinal lymph nodes as well. No esophageal lesions are evident. Lungs/Pleura: There are moderate pleural effusions bilaterally. There is patchy airspace consolidation in posterior lung bases, largely due to compressive atelectasis. A degree of superimposed pneumonia in the bases cannot be excluded, however. There is a degree of interstitial edema in the bases as well. There is an azygos lobe on the right, an anatomic variant. Upper Abdomen: In the visualized upper abdomen, there is aortic atherosclerosis. 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 Visualized upper abdominal structures otherwise appear unremarkable. Musculoskeletal: There is degenerative change in the thoracic spine with diffuse idiopathic skeletal hyperostosis. There is anterior wedging of the L2 vertebral body, incompletely visualized. There are no blastic or lytic bone lesions. Review of the MIP images confirms the above findings. IMPRESSION: 1.  No demonstrable pulmonary embolus. 2. No thoracic aortic aneurysm. There is extensive aortic and great vessel calcification as well as foci of coronary artery calcification. No dissection evident; note that the contrast bolus is not sufficient to exclude this entity from an imaging standpoint. 3. Pleural effusions bilaterally with compressive atelectasis in both lung bases. There may be superimposed pneumonia in the lung bases. There is bibasilar interstitial edema. There is felt to be a degree of underlying congestive heart failure. 4. Mildly enlarged pericarinal region lymph nodes. Suspect reactive etiology  given the parenchymal lung changes. 5.  Small pericardial effusion. 6. 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 Aortic Atherosclerosis (ICD10-I70.0). Electronically Signed   By: Lowella Grip III M.D.   On: 08/26/2017 17:21    Cardiac Studies   Echo 08/26/2017  - Left ventricle: The cavity size was normal. Wall thickness was increased in a pattern of mild LVH. Systolic function was normal. The estimated ejection fraction was in the range of 50% to 55%. There is akinesis of the apical myocardium. Features are consistent with a pseudonormal left ventricular filling pattern, with concomitant abnormal relaxation and increased filling pressure (grade 2 diastolic dysfunction). - Aortic valve: Trileaflet; mildly thickened, mildly calcified leaflets. There was mild regurgitation. - Mitral valve: Severely calcified annulus. Mildly thickened, mildly calcified leaflets . There was moderate regurgitation. - Left atrium: The atrium was mildly to moderately dilated. Volume/bsa, S: 43.1 ml/m^2. - Tricuspid valve: There was moderate regurgitation. - Pulmonary arteries: Systolic pressure was moderately increased.  Impressions:  - Apical akinesis is  new when compared to prior study.  Patient Profile     84 y.o. female with small apical NSTEMI, without angina or clinical signs of CHF, in the setting of recurrent paroxysmal atrial fibrillation with RVR  Assessment & Plan    1. NSTEMI: small, with minimal effect on overall LV function and without overt CHF or serious ventricular arrhythmia. Advised conservative management - add beta blocker, clopidogrel, statin, but can proceed to invasive angiography if complications arise. I suspect that she may have a high grade mid LAD stenosis and that extreme tachycardia during AFib caused a combination of stunning and permanent injury in the  LV apex. Would not be surprised to see apical wall motion improve with time. Doubt that she had MI due to ruptured plaque.stop heparin and start oral anticoagulant and a course of clopidogrel of up to 12 months.   Avoid aspirin due to increased risk of GI bleeding 2. AF w RVR:   With satisfactory rate control on beta-blocker.  Will simplify to a 50 mg twice daily dosage.. No indication for antiarrhythmics since she is unaware of the arrhythmia and I suspect we will be able to achieve reasonable rate control. 3. HTN: Currently well controlled. 4. HLP: Does not have a recent lipid profile, but in 2014 her total cholesterol was 245 and her LDL was 174.  Will start a statin.  Target LDL under 70.  Suspect she will be ready for discharge in the next 24 hours.  To see how she does with ambulation.  For questions or updates, please contact [place] Please consult [email or web address] for contact info under Cardiology/STEMI.      Signed, Sanda Klein, MD  08/28/2017, 12:34 PM

## 2017-08-29 ENCOUNTER — Other Ambulatory Visit: Payer: Self-pay

## 2017-08-29 ENCOUNTER — Inpatient Hospital Stay (HOSPITAL_COMMUNITY): Payer: MEDICARE

## 2017-08-29 DIAGNOSIS — I248 Other forms of acute ischemic heart disease: Secondary | ICD-10-CM

## 2017-08-29 LAB — BASIC METABOLIC PANEL
Anion gap: 13 (ref 5–15)
BUN: 20 mg/dL (ref 6–20)
CO2: 24 mmol/L (ref 22–32)
CREATININE: 1.32 mg/dL — AB (ref 0.44–1.00)
Calcium: 8.3 mg/dL — ABNORMAL LOW (ref 8.9–10.3)
Chloride: 102 mmol/L (ref 101–111)
GFR calc Af Amer: 39 mL/min — ABNORMAL LOW (ref >60)
GFR calc non Af Amer: 34 mL/min — ABNORMAL LOW (ref >60)
GLUCOSE: 129 mg/dL — AB (ref 65–99)
POTASSIUM: 3.9 mmol/L (ref 3.5–5.1)
Sodium: 139 mmol/L (ref 135–145)

## 2017-08-29 MED ORDER — FLUTICASONE PROPIONATE 50 MCG/ACT NA SUSP
1.0000 | Freq: Two times a day (BID) | NASAL | Status: DC
  Administered 2017-08-29 – 2017-08-30 (×3): 1 via NASAL
  Filled 2017-08-29: qty 16

## 2017-08-29 MED ORDER — POTASSIUM CHLORIDE CRYS ER 20 MEQ PO TBCR
20.0000 meq | EXTENDED_RELEASE_TABLET | Freq: Once | ORAL | Status: AC
  Administered 2017-08-29: 20 meq via ORAL
  Filled 2017-08-29: qty 1

## 2017-08-29 MED ORDER — ATORVASTATIN CALCIUM 80 MG PO TABS: 80.0000 mg | ORAL_TABLET | Freq: Every day | ORAL | 0 refills | Status: DC

## 2017-08-29 MED ORDER — LEVALBUTEROL HCL 0.63 MG/3ML IN NEBU
0.6300 mg | INHALATION_SOLUTION | Freq: Once | RESPIRATORY_TRACT | Status: AC
  Administered 2017-08-29: 0.63 mg via RESPIRATORY_TRACT
  Filled 2017-08-29: qty 3

## 2017-08-29 MED ORDER — CLOPIDOGREL BISULFATE 75 MG PO TABS: 75.0000 mg | ORAL_TABLET | Freq: Every day | ORAL | 0 refills | Status: DC

## 2017-08-29 MED ORDER — METOPROLOL TARTRATE 50 MG PO TABS
50.0000 mg | ORAL_TABLET | Freq: Two times a day (BID) | ORAL | Status: DC
  Administered 2017-08-30: 50 mg via ORAL
  Filled 2017-08-29: qty 1

## 2017-08-29 MED ORDER — APIXABAN 5 MG PO TABS: 5.0000 mg | ORAL_TABLET | Freq: Two times a day (BID) | ORAL | 0 refills | Status: DC

## 2017-08-29 MED ORDER — FUROSEMIDE 40 MG PO TABS: 20.0000 mg | ORAL_TABLET | Freq: Every day | ORAL | 3 refills | Status: DC

## 2017-08-29 MED ORDER — METOPROLOL TARTRATE 50 MG PO TABS
50.0000 mg | ORAL_TABLET | Freq: Once | ORAL | Status: AC
  Administered 2017-08-29: 50 mg via ORAL
  Filled 2017-08-29: qty 1

## 2017-08-29 MED ORDER — METOPROLOL TARTRATE 50 MG PO TABS: 50.0000 mg | ORAL_TABLET | Freq: Two times a day (BID) | ORAL | 0 refills | Status: DC

## 2017-08-29 MED ORDER — GUAIFENESIN-DM 100-10 MG/5ML PO SYRP: 5.0000 mL | ORAL_SOLUTION | ORAL | 0 refills | Status: DC | PRN

## 2017-08-29 NOTE — Progress Notes (Signed)
Pt is experiencing a new onset of congested cough gave Robitussin.

## 2017-08-29 NOTE — Clinical Social Work Note (Signed)
Clinical Social Work Assessment  Patient Details  Name: [name] MRN: [id number] Date of Birth: [birth year]  Date of referral:  08/29/17               Reason for consult:  Facility Placement, Discharge Planning                Permission sought to share information with:  Facility Sport and exercise psychologist, Family Supports Permission granted to share information::  Yes, Verbal Permission Granted  Name::     Lethia Donlon  Agency::  [facility]  Relationship::  Husband  Contact Information:  [phone]  Housing/Transportation Living arrangements for the past 2 months:  [place] of Information:  Patient, Medical Team, Engineer, materials, Spouse, Facility Patient Interpreter Needed:  None Criminal Activity/Legal Involvement Pertinent to Current Situation/Hospitalization:  No - Comment as needed Significant Relationships:  Friend, Spouse Lives with:  Spouse Do you feel safe going back to the place where you live?  Yes Need for family participation in patient care:  Yes (Comment)  Care giving concerns:  Patient is a resident at [facility]. PT recommending SNF once medically stable for discharge.   Social Worker assessment / plan:  CSW met with patient. Husband at friend at bedside. CSW introduced role and explained that PT recommendations would be discussed. Patient confirmed she lives at [facility] and is agreeable to going to the SNF side for rehab. The facility does have a bed available. Admissions coordinator will see if her insurance requires prior authorization. They are requesting that OT see her prior to discharge. PT has sent out a page for OT to see her as soon as possible. Patient's husband will go to the facility to complete paperwork. No further concerns. CSW encouraged patient, her husband, and friend to contact CSW as needed. CSW will continue to follow patient and her husband and friend for support and facilitate discharge  to SNF once OT has seen and SNF has received insurance approval.  Employment status:  Retired Nurse, adult PT Recommendations:  [place] / Referral to community resources:  [place]  Patient/Family's Response to care:  Patient and her husband agreeable to SNF placement. Patient's husband and friend supportive and involved in patient's care. Patient and her husband and friend appreciated social work intervention.  Patient/Family's Understanding of and Emotional Response to Diagnosis, Current Treatment, and Prognosis:  Patient and her husband and friend have a good understanding of the reason for admission and her need for rehab prior to returning to independent living. Patient and her husband and friend appear happy with hospital care.  Emotional Assessment Appearance:  Appears stated age Attitude/Demeanor/Rapport:  Engaged, Gracious Affect (typically observed):  Accepting, Appropriate, Calm, Pleasant Orientation:  Oriented to Self, Oriented to Place, Oriented to  Time, Oriented to Situation Alcohol / Substance use:  Never Used Psych involvement (Current and /or in the community):  No (Comment)  Discharge Needs  Concerns to be addressed:  Care Coordination Readmission within the last 30 days:  Yes Current discharge risk:  Dependent with Mobility Barriers to Discharge:  Insurance Authorization, Other(OT evaluation)   Candie Chroman, LCSW 08/29/2017, 2:19 PM

## 2017-08-29 NOTE — Progress Notes (Signed)
Progress Note  Patient Name: [name] Date of Encounter: 08/29/2017  Primary Cardiologist: No primary care provider on file.   Subjective   Patient feels well although with some SOB and congestion overnight. CXR without acute change. No recorded AFib on telemetry and didn't notice any CP or weakness.   Inpatient Medications    Scheduled Meds: . apixaban  5 mg Oral BID  . atorvastatin  80 mg Oral q1800  . clopidogrel  75 mg Oral Daily  . furosemide  40 mg Oral Daily  . metoprolol tartrate  50 mg Oral BID  . pantoprazole  40 mg Oral Daily   Continuous Infusions:  PRN Meds: acetaminophen **OR** acetaminophen, guaiFENesin-dextromethorphan, ondansetron **OR** ondansetron (ZOFRAN) IV   Vital Signs    Vitals:   08/28/17 1119 08/28/17 1638 08/28/17 1918 08/29/17 0518  BP: (!) 116/55 (!) 134/41 (!) 131/36 (!) 138/46  Pulse: (!) 59 70 73 83  Resp: 20  18 18   Temp: (!) 97 F (36.1 C)  98.9 F (37.2 C) 98.1 F (36.7 C)  TempSrc: Oral  Oral Oral  SpO2: 97%  95% 98%  Weight:    159 lb 9.6 oz (72.4 kg)  Height:        Intake/Output Summary (Last 24 hours) at 08/29/2017 1308 Last data filed at 08/29/2017 6578 Gross per 24 hour  Intake 600 ml  Output 1652 ml  Net -1052 ml   Filed Weights   08/27/17 0441 08/28/17 0603 08/29/17 0518  Weight: 165 lb 3.2 oz (74.9 kg) 167 lb 8.8 oz (76 kg) 159 lb 9.6 oz (72.4 kg)    Telemetry    Normal sinus rhythm >36 hours - Personally Reviewed  ECG    No new tracing- Personally Reviewed  Physical Exam   GEN: [place] caucasian woman resting comfortably in bed. No acute distress.   Neck: Supple without apparent JVD Cardiac: RRR, no murmurs.  Respiratory: Clear to auscultation bilaterally with normal WOB on RA.  GI: Soft, nontender, non-distended  MS: No edema. Extremities warm and perfused.  Neuro:  Nonfocal, moves extremities sponatenously Psych: Normal affect, responds to questions appropriately  Labs     Chemistry Recent Labs  Lab 08/27/17 0636 08/28/17 0746 08/29/17 0629  NA 138 137 139  K 4.1 4.0 3.9  CL 104 103 102  CO2 22 23 24   GLUCOSE 151* 135* 129*  BUN 25* 24* 20  CREATININE 1.35* 1.36* 1.32*  CALCIUM 8.3* 8.0* 8.3*  GFRNONAA 33* 32* 34*  GFRAA 38* 38* 39*  ANIONGAP 12 11 13      Hematology Recent Labs  Lab 08/25/17 1751 08/26/17 0242 08/27/17 0636  WBC 11.1* 9.5 8.7  RBC 4.05 3.71* 3.89  HGB 11.9* 10.5* 11.3*  HCT 37.6 34.2* 35.9*  MCV 92.8 92.2 92.3  MCH 29.4 28.3 29.0  MCHC 31.6 30.7 31.5  RDW 14.1 14.3 14.4  PLT 428* 379 364    Cardiac Enzymes Recent Labs  Lab 08/25/17 1751 08/25/17 2330 08/26/17 0242 08/26/17 0934  TROPONINI 0.65* 6.39* 7.30* 4.21*   No results for input(s): TROPIPOC in the last 168 hours.   BNPNo results for input(s): BNP, PROBNP in the last 168 hours.   [place]imer  Recent Labs  Lab 08/25/17 2330  DDIMER 2.48*     Radiology    CTA PE Study 08/26/17: IMPRESSION: 1.  No demonstrable pulmonary embolus. 2. No thoracic aortic aneurysm. There is extensive aortic and great vessel calcification as well as foci of coronary artery calcification.  No dissection evident 3. Pleural effusions bilaterally with compressive atelectasis in both lung bases. There may be superimposed pneumonia in the lung bases. There is bibasilar interstitial edema. There is felt to be a degree of underlying congestive heart failure. 4. Mildly enlarged pericarinal region lymph nodes. Suspect reactive etiology given the parenchymal lung changes. 5.  Small pericardial effusion. 6. 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 7. Aortic Atherosclerosis (ICD10-I70.0).  Dg Chest Port 1 View  Result Date: 08/29/2017 CLINICAL DATA:  84 year old female with shortness breath and cough. Breast cancer post lumpectomies 6 years ago. Subsequent encounter. EXAM: PORTABLE  CHEST 1 VIEW COMPARISON:  08/26/2017 chest CT. 08/25/2017 and 08/12/2017 chest x-ray. FINDINGS: Interval slight decrease in degree of pulmonary vascular congestion. CT detected pleural effusions and basal atelectasis not as well delineated on the present plain film exam. Cardiomegaly. Calcified aorta. Curvature thoracic spine. IMPRESSION: Interval slight decrease in degree of pulmonary vascular congestion. CT noted pleural effusions and basilar atelectasis not as well delineated on present plain film exam. Cardiomegaly. Aortic Atherosclerosis (ICD10-I70.0). Electronically Signed   By: Genia Del M.D.   On: 08/29/2017 09:57    Cardiac Studies   Echo 08/26/2017  - Left ventricle: The cavity size was normal. Wall thickness was increased in a pattern of mild LVH. Systolic function was normal. The estimated ejection fraction was in the range of 50% to 55%. There is akinesis of the apical myocardium. Features are consistent with a pseudonormal left ventricular filling pattern, with concomitant abnormal relaxation and increased filling pressure (grade 2 diastolic dysfunction). - Aortic valve: Trileaflet; mildly thickened, mildly calcified leaflets. There was mild regurgitation. - Mitral valve: Severely calcified annulus. Mildly thickened, mildly calcified leaflets . There was moderate regurgitation. - Left atrium: The atrium was mildly to moderately dilated. Volume/bsa, S: 43.1 ml/m^2. - Tricuspid valve: There was moderate regurgitation. - Pulmonary arteries: Systolic pressure was moderately increased.  Impressions:  - Apical akinesis is new when compared to prior study.  Patient Profile     84 y.o. female with small apical NSTEMI, without angina or clinical signs of CHF, in the setting of recurrent paroxysmal atrial fibrillation with RVR  Assessment & Plan    1. NSTEMI: small, with minimal effect on overall LV function (new apical kinesis but function unchanged) and  without overt CHF or serious ventricular arrhythmia. Conservative management most appropriate with BB, Plavix and statin. Avoiding ASA due to incr risk of GIB. Suspect extreme tachycardia during AFib caused combination of stunning and injury at LV apex, which could improve over time. Doubt MI due to plaque rupture and Heparin has since been dc.  -Lopressor 50mg  BID -Plavix 75mg  daily x 12 months -Telemetry  2. AF w RVR: With satisfactory rate control on BB which was simplified to Metoprolol 50mg  BID yesterday. No current indication for antiarrythmic's. She was transitioned to DOAC yesterday.  -Eliquis 5mg  BID -Metoprolol 50mg  BID  3. HTN: Currently well controlled. Pressures are tolerating medication adjustments as above  4. HLD: Does not have a recent lipid profile, but in 2014 her total cholesterol was 245 and her LDL was 174. Target LDL under 70. Atorvastatin 80mg  was initiated this admission, she was not previously on a statin.  -Atorvastatin 80mg   Disposition: Suspect patient nearing discharge if ambulates OK. Will need outpatient cardiology follow-up.    For questions or updates, please contact [place] Please consult [email or web address] for contact info under Cardiology/STEMI.  Signed, Nickson Middlesworth, DO  08/29/2017, 7:52 AM

## 2017-08-29 NOTE — Plan of Care (Signed)
  Education: Knowledge of General Education information will improve 08/29/2017 0801 - Progressing by Imagene Gurney, RN

## 2017-08-29 NOTE — Progress Notes (Signed)
08/29/17 @ 1703, pt had non-sustaining HR of 212.  Pt asymptomatic.  Heart strip may be viewed in Epic.  Cardiology has been notified.

## 2017-08-29 NOTE — Progress Notes (Addendum)
Benefit check for Eliquis in progressAneta Mins [phone]   RE: Benefit check  Received: Today  Message Contents  Memory Argue CMA        # 6  S/W ELREASHA @ [employer] ( PDP )  # [phone] OPT- 2   ELIQUIS 5 MG BID  COVER- YES  CO-PAY- $ 30.00  TIER- 3 DRUG  PRIOR APPROVAL- NO   PREFERRED PHARMACY : [facility]

## 2017-08-29 NOTE — NC FL2 (Signed)
[place] LEVEL OF CARE SCREENING TOOL     IDENTIFICATION  Patient Name: [name] Birthdate: 1921/08/02 Sex: female Admission Date (Current Location): 08/25/2017  [facility] and [place] Number:  Herbalist and Address:  [facility], [place] [address], [place], [place] [address]      Provider Number: [id number]  Attending Physician Name and Address:  Patrecia Pour, Christean Grief, MD  Relative Name and Phone Number:       Current Level of Care: Hospital Recommended Level of Care: [place] Prior Approval Number:    Date Approved/Denied:   PASRR Number: [id number]  Discharge Plan: SNF    Current Diagnoses: Patient Active Problem List   Diagnosis Date Noted  . Non-ST elevated myocardial infarction ([place]) 08/26/2017  . Near syncope 08/25/2017  . Atrial fibrillation with RVR ([place]) 08/25/2017  . Elevated troponin 08/25/2017  . Grade II diastolic dysfunction 19/37/9024  . Acute on chronic cholecystitis 08/13/2017  . HTN (hypertension) 08/13/2017  . Anemia 08/13/2017  . GERD (gastroesophageal reflux disease) 08/13/2017  . Acute cholecystitis 08/12/2017  . Genetic testing 10/10/2014  . Family history of malignant neoplasm of breast   . Family history of malignant neoplasm of ovary   . Breast cancer of upper-outer quadrant of right female breast ([place]) 09/13/2014  . History of esophageal stricture 07/25/2014  . Dermatophytosis of nail   . RHUS DERMATITIS 05/27/2010  . Osteoporosis 04/14/2010  . VAGINITIS, ATROPHIC 10/28/2009  . DYSURIA 10/28/2009  . GOITER, MULTINODULAR 03/05/2009  . THYROID NODULE, RIGHT 02/05/2009  . Venous (peripheral) insufficiency 02/05/2009  . SENILE OSTEOPOROSIS 01/24/2008  . URI 10/18/2007  . CUTANEOUS ERUPTIONS, DRUG-INDUCED 02/09/2007  . BREAST CANCER, HX OF 02/09/2007  . Allergic rhinitis 02/02/2007  . Osteoarthritis 02/02/2007    Orientation RESPIRATION BLADDER Height & Weight      Self, Time, Situation, Place  Normal Continent Weight: 159 lb 9.6 oz (72.4 kg) Height:  5\' 3"  (160 cm)  BEHAVIORAL SYMPTOMS/MOOD NEUROLOGICAL BOWEL NUTRITION STATUS  (None) (None) Continent Diet(Heart healthy)  AMBULATORY STATUS COMMUNICATION OF NEEDS Skin   Limited Assist Verbally Normal                       Personal Care Assistance Level of Assistance  Bathing, Feeding, Dressing Bathing Assistance: Limited assistance Feeding assistance: Independent Dressing Assistance: Limited assistance     Functional Limitations Info  Sight, Hearing, Speech Sight Info: Adequate Hearing Info: Adequate Speech Info: Adequate    SPECIAL CARE FACTORS FREQUENCY  PT (By licensed PT), OT (By licensed OT)     PT Frequency: 5 x week OT Frequency: 5 x week            Contractures Contractures Info: Not present    Additional Factors Info  Code Status, Allergies Code Status Info: Full Allergies Info: Celecoxib           Current Medications (08/29/2017):  This is the current hospital active medication list Current Facility-Administered Medications  Medication Dose Route Frequency Provider Last Rate Last Dose  . acetaminophen (TYLENOL) tablet 650 mg  650 mg Oral Q6H PRN Rise Patience, MD   650 mg at 08/28/17 2135   Or  . acetaminophen (TYLENOL) suppository 650 mg  650 mg Rectal Q6H PRN Rise Patience, MD      . apixaban Arne Cleveland) tablet 5 mg  5 mg Oral BID Skeet Simmer, RPH   5 mg at 08/29/17 1014  .  atorvastatin (LIPITOR) tablet 80 mg  80 mg Oral q1800 Rise Patience, MD   80 mg at 08/28/17 1638  . clopidogrel (PLAVIX) tablet 75 mg  75 mg Oral Daily Croitoru, Mihai, MD   75 mg at 08/29/17 1014  . fluticasone (FLONASE) 50 MCG/ACT nasal spray 1 spray  1 spray Each Nare BID Patrecia Pour, Christean Grief, MD   1 spray at 08/29/17 1147  . furosemide (LASIX) tablet 40 mg  40 mg Oral Daily Molt, Bethany, DO   40 mg at 08/29/17 1014  . guaiFENesin-dextromethorphan (ROBITUSSIN  DM) 100-10 MG/5ML syrup 5 mL  5 mL Oral Q4H PRN Patrecia Pour, Christean Grief, MD   5 mL at 08/29/17 0146  . metoprolol tartrate (LOPRESSOR) tablet 50 mg  50 mg Oral BID Croitoru, Mihai, MD   50 mg at 08/29/17 1014  . ondansetron (ZOFRAN) tablet 4 mg  4 mg Oral Q6H PRN Rise Patience, MD       Or  . ondansetron [facility]) injection 4 mg  4 mg Intravenous Q6H PRN Rise Patience, MD      . pantoprazole (PROTONIX) EC tablet 40 mg  40 mg Oral Daily Rise Patience, MD   40 mg at 08/29/17 1014     Discharge Medications: Please see discharge summary for a list of discharge medications.  Relevant Imaging Results:  Relevant Lab Results:   Additional Information SS#: [id number]  Candie Chroman, LCSW

## 2017-08-29 NOTE — Progress Notes (Addendum)
PROGRESS NOTE [facility]   BOSTON CATARINO   RWE:[id number] DOB: 09-Apr-1922  DOA: 08/25/2017 PCP: Marletta Lor, MD   Brief Narrative:  [name] is a 84 year old female with past medical history of hypertension, breast cancer in remission who had a recent cholecystectomy 10 days ago sent by physical therapist due to tachycardia.  Upon evaluation she was found to be in A. fib with RVR and patient was admitted for further management.  Subjective: Seen and examined continues to improve.  Remains in normal sinus rhythm.  Mild cough this morning.  Chest x-ray decreasing vascular congestion compared with few days ago.  BP stable.  Complaining of soft stools, no abdominal pain.   Assessment & Plan: A. fib with RVR - rhythm improved New onset Felt to be due to NSTEMI, echo shows apical akinesis new from last echo performed on January.  She initially treated with Cardizem drip, now transitioned to beta-blocker, metoprolol 50 mg twice daily CHADsVAsC 3, she initially treated with heparin drip, transitioned to Eliquis. Ambulated well with no issues and heart rate  NSTEMI Cardiology recommending conservative management, continue beta-blocker, Plavix and statin.  No cardiac cath at this moment, but could be consider if patient becomes symptomatic. Heparin drip discontinued, transitioned to Eliquis.  No ASA given risks of GI bleed.  Follow-up with cardiology in 1-2 weeks  Diastolic heart failure - stable Seems to be compensated at this time Echo shows LVEF 50-55% apical akinesis, grade 2 diastolic dysfunction Will continue Lasix for 1 more week then to discuss with cardiology further regimen  Acute renal failure on CKD stage III Baseline creatinine 1.12 weeks ago, upon admission 1.5 Likely prerenal Creatinine seems to have plateaued at around 1.3 Monitor BMP in 1 week  Hypertension BP stable she is on beta-blocker at this point  Continue to monitor  Deconditioning PT  evaluated patient felt to be unsteady and recommended SNF, husband would like for her to go to rehab.  Social worker notified and will prep for discharge in a.m.  DVT prophylaxis: Eliquis Code Status: Full code Family Communication: None at bedside Disposition Plan: SNF in a.m.  Consultants:   Cardiology  Procedures:   None  Antimicrobials:  None   Objective: Vitals:   08/28/17 1638 08/28/17 1918 08/29/17 0518 08/29/17 1205  BP: (!) 134/41 (!) 131/36 (!) 138/46 (!) 130/47  Pulse: 70 73 83 73  Resp:  18 18 18   Temp:  98.9 F (37.2 C) 98.1 F (36.7 C) 99.3 F (37.4 C)  TempSrc:  Oral Oral Oral  SpO2:  95% 98% 96%  Weight:   72.4 kg (159 lb 9.6 oz)   Height:        Intake/Output Summary (Last 24 hours) at 08/29/2017 1522 Last data filed at 08/29/2017 1019 Gross per 24 hour  Intake 655 ml  Output 500 ml  Net 155 ml   Filed Weights   08/27/17 0441 08/28/17 0603 08/29/17 0518  Weight: 74.9 kg (165 lb 3.2 oz) 76 kg (167 lb 8.8 oz) 72.4 kg (159 lb 9.6 oz)    Examination:  General: Pt is alert, awake, not in acute distress Cardiovascular: RRR, S1/S2 +, no rubs, no gallops Respiratory: CTA bilaterally, no wheezing, no rhonchi Extremities: no edema, no cyanosis  Data Reviewed: I have personally reviewed following labs and imaging studies  CBC: Recent Labs  Lab 08/25/17 1751 08/26/17 0242 08/27/17 0636  WBC 11.1* 9.5 8.7  NEUTROABS 8.7*  --   --   HGB  11.9* 10.5* 11.3*  HCT 37.6 34.2* 35.9*  MCV 92.8 92.2 92.3  PLT 428* 379 329   Basic Metabolic Panel: Recent Labs  Lab 08/25/17 1751 08/26/17 0242 08/27/17 0636 08/28/17 0746 08/29/17 0629  NA 141 139 138 137 139  K 4.7 4.3 4.1 4.0 3.9  CL 104 106 104 103 102  CO2 21* 21* 22 23 24   GLUCOSE 158* 126* 151* 135* 129*  BUN 34* 27* 25* 24* 20  CREATININE 1.53* 1.27* 1.35* 1.36* 1.32*  CALCIUM 8.7* 8.2* 8.3* 8.0* 8.3*   GFR: Estimated Creatinine Clearance: 25.4 mL/min (A) (by C-G formula based on SCr  of 1.32 mg/dL (H)). Liver Function Tests: No results for input(s): AST, ALT, ALKPHOS, BILITOT, PROT, ALBUMIN in the last 168 hours. No results for input(s): LIPASE, AMYLASE in the last 168 hours. No results for input(s): AMMONIA in the last 168 hours. Coagulation Profile: No results for input(s): INR, PROTIME in the last 168 hours. Cardiac Enzymes: Recent Labs  Lab 08/25/17 1751 08/25/17 2330 08/26/17 0242 08/26/17 0934  TROPONINI 0.65* 6.39* 7.30* 4.21*   BNP (last 3 results) No results for input(s): PROBNP in the last 8760 hours. HbA1C: No results for input(s): HGBA1C in the last 72 hours. CBG: No results for input(s): GLUCAP in the last 168 hours. Lipid Profile: No results for input(s): CHOL, HDL, LDLCALC, TRIG, CHOLHDL, LDLDIRECT in the last 72 hours. Thyroid Function Tests: No results for input(s): TSH, T4TOTAL, FREET4, T3FREE, THYROIDAB in the last 72 hours. Anemia Panel: No results for input(s): VITAMINB12, FOLATE, FERRITIN, TIBC, IRON, RETICCTPCT in the last 72 hours. Sepsis Labs: Recent Labs  Lab 08/25/17 2030  LATICACIDVEN 1.38    No results found for this or any previous visit (from the past 240 hour(s)).    Radiology Studies: Dg Chest Port 1 View  Result Date: 08/29/2017 CLINICAL DATA:  84 year old female with shortness breath and cough. Breast cancer post lumpectomies 6 years ago. Subsequent encounter. EXAM: PORTABLE CHEST 1 VIEW COMPARISON:  08/26/2017 chest CT. 08/25/2017 and 08/12/2017 chest x-ray. FINDINGS: Interval slight decrease in degree of pulmonary vascular congestion. CT detected pleural effusions and basal atelectasis not as well delineated on the present plain film exam. Cardiomegaly. Calcified aorta. Curvature thoracic spine. IMPRESSION: Interval slight decrease in degree of pulmonary vascular congestion. CT noted pleural effusions and basilar atelectasis not as well delineated on present plain film exam. Cardiomegaly. Aortic Atherosclerosis  (ICD10-I70.0). Electronically Signed   By: Genia Del M.D.   On: 08/29/2017 09:57    Scheduled Meds: . apixaban  5 mg Oral BID  . atorvastatin  80 mg Oral q1800  . clopidogrel  75 mg Oral Daily  . fluticasone  1 spray Each Nare BID  . furosemide  40 mg Oral Daily  . metoprolol tartrate  50 mg Oral BID  . pantoprazole  40 mg Oral Daily   Continuous Infusions:    LOS: 3 days    Time spent: Total of 15 minutes spent with pt, greater than 50% of which was spent in discussion of  treatment, counseling and coordination of care   Chipper Oman, MD Pager: Text Page via [email or web address]   If 7PM-7AM, please contact night-coverage [email or web address] 08/29/2017, 3:22 PM

## 2017-08-29 NOTE — Evaluation (Addendum)
Physical Therapy Evaluation Patient Details Name: [name] MRN: [id number] DOB: 03-02-1922 Today's Date: 08/29/2017   History of Present Illness  Pt adm with afib with RVR and NSTEMI. PMH - HTN, breast CA, recent cholecystectomy  Clinical Impression  Pt presents to PT with unsteady gait after multiple recent medical issues. Pt lives with 82 year old husband. Believe pt needs ST-SNF at [facility] before transition back to her independent apartment with her husband. Pt and husband are agreeable to this plan.    Follow Up Recommendations SNF    Equipment Recommendations  None recommended by PT    Recommendations for Other Services       Precautions / Restrictions Precautions Precautions: Fall Restrictions Weight Bearing Restrictions: No      Mobility  Bed Mobility Overal bed mobility: Needs Assistance Bed Mobility: Supine to Sit     Supine to sit: Supervision     General bed mobility comments: Incr time but no physical assist  Transfers Overall transfer level: Needs assistance Equipment used: Rolling walker (2 wheeled) Transfers: Sit to/from Stand Sit to Stand: Min guard         General transfer comment: Incr time to rise and assist for safety  Ambulation/Gait Ambulation/Gait assistance: Min guard Ambulation Distance (Feet): 125 Feet Assistive device: Rolling walker (2 wheeled) Gait Pattern/deviations: Step-through pattern;Decreased stride length;Trunk flexed Gait velocity: decr Gait velocity interpretation: <1.8 ft/sec, indicative of risk for recurrent falls General Gait Details: Assist for safety and balance. Pt slightly unsteady. Stability improved with incr distance. HR 83 with amb  Stairs            Wheelchair Mobility    Modified Rankin (Stroke Patients Only)       Balance Overall balance assessment: Needs assistance Sitting-balance support: No upper extremity supported;Feet supported Sitting balance-Leahy Scale: Good      Standing balance support: Bilateral upper extremity supported Standing balance-Leahy Scale: Poor Standing balance comment: walker and min guard for static standing                             Pertinent Vitals/Pain Pain Assessment: No/denies pain    Home Living Family/patient expects to be discharged to:: Private residence([facility]) Living Arrangements: Spouse/significant other Available Help at Discharge: Family Type of Home: Independent living facility([facility]) Home Access: Level entry     Home Layout: One level Home Equipment: Environmental consultant - 2 wheels      Prior Function Level of Independence: Independent with assistive device(s)         Comments: Has used RW since cholecystectomy several weeks ago.     Hand Dominance   Dominant Hand: Right    Extremity/Trunk Assessment   Upper Extremity Assessment Upper Extremity Assessment: Defer to OT evaluation    Lower Extremity Assessment Lower Extremity Assessment: Generalized weakness       Communication   Communication: No difficulties  Cognition Arousal/Alertness: Awake/alert Behavior During Therapy: WFL for tasks assessed/performed Overall Cognitive Status: Within Functional Limits for tasks assessed                                        General Comments      Exercises     Assessment/Plan    PT Assessment Patient needs continued PT services  PT Problem List Decreased strength;Decreased activity tolerance;Decreased balance;Decreased mobility;Decreased knowledge of  use of DME       PT Treatment Interventions DME instruction;Gait training;Functional mobility training;Therapeutic activities;Therapeutic exercise;Balance training;Patient/family education    PT Goals (Current goals can be found in the Care Plan section)  Acute Rehab PT Goals Patient Stated Goal: return home PT Goal Formulation: With patient/family Time For Goal Achievement: 09/05/17 Potential to  Achieve Goals: Good    Frequency Min 3X/week   Barriers to discharge Decreased caregiver support Husband is 90 years old    Co-evaluation               AM-PAC PT "6 Clicks" Daily Activity  Outcome Measure Difficulty turning over in bed (including adjusting bedclothes, sheets and blankets)?: A Little Difficulty moving from lying on back to sitting on the side of the bed? : A Little Difficulty sitting down on and standing up from a chair with arms (e.g., wheelchair, bedside commode, etc,.)?: Unable Help needed moving to and from a bed to chair (including a wheelchair)?: A Little Help needed walking in hospital room?: A Little Help needed climbing 3-5 steps with a railing? : A Lot 6 Click Score: 15    End of Session Equipment Utilized During Treatment: Gait belt Activity Tolerance: Patient limited by fatigue Patient left: Other (comment);with family/visitor present(On BSC)   PT Visit Diagnosis: Unsteadiness on feet (R26.81);Muscle weakness (generalized) (M62.81)    Time: 2633-3545 PT Time Calculation (min) (ACUTE ONLY): 36 min   Charges:   PT Evaluation $PT Eval Moderate Complexity: 1 Mod PT Treatments $Gait Training: 8-22 mins   PT G Codes:        [facility] PT [place] 08/29/2017, 1:45 PM

## 2017-08-29 NOTE — Clinical Social Work Note (Signed)
Per MD, plan for discharge to SNF tomorrow. Patient and friend at bedside notified. Patient's husband has already left to go to the facility to complete paperwork. Friend will notify him. SNF admissions coordinator notified.  Dayton Scrape, [place]

## 2017-08-29 NOTE — Progress Notes (Signed)
Progress Note  Patient Name: [name] Date of Encounter: 08/29/2017  Primary Cardiologist: No primary care provider on file.   Subjective   Generally feels well .  A little bit of problem with cough seems to have resolved.  Chest x-ray this morning shows less pulmonary vascular congestion compared with a few days ago.  Has remained in normal rhythm.  Blood pressure is in optimal range.  Inpatient Medications    Scheduled Meds: . apixaban  5 mg Oral BID  . atorvastatin  80 mg Oral q1800  . clopidogrel  75 mg Oral Daily  . fluticasone  1 spray Each Nare BID  . furosemide  40 mg Oral Daily  . metoprolol tartrate  50 mg Oral BID  . pantoprazole  40 mg Oral Daily   Continuous Infusions:  PRN Meds: acetaminophen **OR** acetaminophen, guaiFENesin-dextromethorphan, ondansetron **OR** ondansetron (ZOFRAN) IV   Vital Signs    Vitals:   08/28/17 1638 08/28/17 1918 08/29/17 0518 08/29/17 1205  BP: (!) 134/41 (!) 131/36 (!) 138/46 (!) 130/47  Pulse: 70 73 83 73  Resp:  18 18 18   Temp:  98.9 F (37.2 C) 98.1 F (36.7 C) 99.3 F (37.4 C)  TempSrc:  Oral Oral Oral  SpO2:  95% 98% 96%  Weight:   159 lb 9.6 oz (72.4 kg)   Height:        Intake/Output Summary (Last 24 hours) at 08/29/2017 1237 Last data filed at 08/29/2017 1019 Gross per 24 hour  Intake 895 ml  Output 801 ml  Net 94 ml   Filed Weights   08/27/17 0441 08/28/17 0603 08/29/17 0518  Weight: 165 lb 3.2 oz (74.9 kg) 167 lb 8.8 oz (76 kg) 159 lb 9.6 oz (72.4 kg)    Telemetry    Sinus rhythm- Personally Reviewed  ECG    No new tracing- Personally Reviewed  Physical Exam  Looks very comfortable today GEN: No acute distress.   Neck: No JVD Cardiac: RRR, no murmurs, rubs, or gallops.  Respiratory: Clear to auscultation bilaterally. GI: Soft, nontender, non-distended  MS: No edema; No deformity. Neuro:  Nonfocal  Psych: Normal affect   Labs    Chemistry Recent Labs  Lab 08/27/17 0636  08/28/17 0746 08/29/17 0629  NA 138 137 139  K 4.1 4.0 3.9  CL 104 103 102  CO2 22 23 24   GLUCOSE 151* 135* 129*  BUN 25* 24* 20  CREATININE 1.35* 1.36* 1.32*  CALCIUM 8.3* 8.0* 8.3*  GFRNONAA 33* 32* 34*  GFRAA 38* 38* 39*  ANIONGAP 12 11 13      Hematology Recent Labs  Lab 08/25/17 1751 08/26/17 0242 08/27/17 0636  WBC 11.1* 9.5 8.7  RBC 4.05 3.71* 3.89  HGB 11.9* 10.5* 11.3*  HCT 37.6 34.2* 35.9*  MCV 92.8 92.2 92.3  MCH 29.4 28.3 29.0  MCHC 31.6 30.7 31.5  RDW 14.1 14.3 14.4  PLT 428* 379 364    Cardiac Enzymes Recent Labs  Lab 08/25/17 1751 08/25/17 2330 08/26/17 0242 08/26/17 0934  TROPONINI 0.65* 6.39* 7.30* 4.21*   No results for input(s): TROPIPOC in the last 168 hours.   BNPNo results for input(s): BNP, PROBNP in the last 168 hours.   DDimer  Recent Labs  Lab 08/25/17 2330  DDIMER 2.48*     Radiology    Dg Chest Port 1 View  Result Date: 08/29/2017 CLINICAL DATA:  84 year old female with shortness breath and cough. Breast cancer post lumpectomies 6 years ago. Subsequent encounter.  EXAM: PORTABLE CHEST 1 VIEW COMPARISON:  08/26/2017 chest CT. 08/25/2017 and 08/12/2017 chest x-ray. FINDINGS: Interval slight decrease in degree of pulmonary vascular congestion. CT detected pleural effusions and basal atelectasis not as well delineated on the present plain film exam. Cardiomegaly. Calcified aorta. Curvature thoracic spine. IMPRESSION: Interval slight decrease in degree of pulmonary vascular congestion. CT noted pleural effusions and basilar atelectasis not as well delineated on present plain film exam. Cardiomegaly. Aortic Atherosclerosis (ICD10-I70.0). Electronically Signed   By: Genia Del M.D.   On: 08/29/2017 09:57    Cardiac Studies   Echo 08/26/2017  - Left ventricle: The cavity size was normal. Wall thickness was increased in a pattern of mild LVH. Systolic function was normal. The estimated ejection fraction was in the range of 50% to  55%. There is akinesis of the apical myocardium. Features are consistent with a pseudonormal left ventricular filling pattern, with concomitant abnormal relaxation and increased filling pressure (grade 2 diastolic dysfunction). - Aortic valve: Trileaflet; mildly thickened, mildly calcified leaflets. There was mild regurgitation. - Mitral valve: Severely calcified annulus. Mildly thickened, mildly calcified leaflets . There was moderate regurgitation. - Left atrium: The atrium was mildly to moderately dilated. Volume/bsa, S: 43.1 ml/m^2. - Tricuspid valve: There was moderate regurgitation. - Pulmonary arteries: Systolic pressure was moderately increased.  Impressions:  - Apical akinesis is new when compared to prior study.    Patient Profile     84 y.o. female with small apical NSTEMI, without angina or clinical signs of CHF, in the setting of recurrent paroxysmalatrial fibrillationwith RVR   Assessment & Plan    1. NSTEMI: small myocardial infarction by echo, but cardiac enzymes suggest an even smaller amount of injury.  I would interpret this as meaning that she had a demand-related myocardial infarction in the setting of atrial fibrillation rapid ventricular response, probably with a high-grade mid-distal LAD artery stenosis.  This appears to be a more likely explanation for her presentation than a true acute atherothrombotic syndrome. Would not be surprised to see apical wall motion improve with time.  Plan conservative management with clopidogrel, beta-blocker, statin.  Avoid aspirin since she would also be taking a direct oral anticoagulant.  Option remains open for invasive coronary angiography if she develops angina or heart to control heart failure, the patient clearly prefers conservative management and this is very appropriate in view of her age and comorbid conditions. 2. CHF: Improved clinically and by chest x-ray.  Mild reduction in LV systolic function  may be transient.  She should probably stay on a low dose of loop diuretic at least for the next.  Consider reevaluating left ventricular systolic function and filling pressures with echo in about 2-3 months.  Her diastolic blood pressure is quite low.  She has chronic kidney disease.  I would like to avoid putting her on a RAAS inhibitor.  3. AF w RVR:   Metoprolol for rate control and treatment of heart failure and prevention of CAD events.  Full antiarrhythmic therapy is not indicated.  She was unaware of the arrhythmia.  She is on oral anticoagulation. 4. UMP:NTIRWERXV well controlled.  As mentioned, her diastolic blood pressure is very low and I would avoid adding more agents 5. QMG:QQPYPP has been added.  Repeat lipid profile in about 3 months.  If she does well with physical therapy today, will probably be discharged.  We will make arrangements for transition of care office appointment in no more than 2 weeks.  For questions or updates,  please contact [place] Please consult [email or web address] for contact info under Cardiology/STEMI.      Signed, Sanda Klein, MD  08/29/2017, 12:37 PM

## 2017-08-29 NOTE — Evaluation (Signed)
Occupational Therapy Evaluation Patient Details Name: [name] MRN: [id number] DOB: 05/17/1922 Today's Date: 08/29/2017    History of Present Illness Pt adm with afib with RVR and NSTEMI. PMH - HTN, breast CA, recent cholecystectomy   Clinical Impression   PTA, pt was able to complete ADL and functional mobility at modified independent level utilizing RW for mobility. She currently presents with decreased strength and activity tolerance impacting her ability to participate in ADL at [facility]. She requires overall min guard assist for standing grooming tasks, LB ADL, and ADL transfers. Pt lives in independent living at [facility] with her husband who is 82 years old. Recommend short term SNF stay at [facility] prior to returning home to independent living to maximize safety and independence. All further OT needs will be met at next venue of care and will defer further OT treatment at this time. Acute OT will sign off.     Follow Up Recommendations  SNF;Supervision/Assistance - 24 hour    Equipment Recommendations  Other (comment)(defer to next venue of care)    Recommendations for Other Services       Precautions / Restrictions Precautions Precautions: Fall Restrictions Weight Bearing Restrictions: No      Mobility Bed Mobility Overal bed mobility: Needs Assistance Bed Mobility: Supine to Sit     Supine to sit: Supervision     General bed mobility comments: Increased time without physical assistance  Transfers Overall transfer level: Needs assistance Equipment used: Rolling walker (2 wheeled) Transfers: Sit to/from Stand Sit to Stand: Min guard         General transfer comment: Increased time and hands on guarding for safety.    Balance Overall balance assessment: Needs assistance Sitting-balance support: No upper extremity supported;Feet supported Sitting balance-Leahy Scale: Good     Standing balance support: Bilateral upper extremity  supported Standing balance-Leahy Scale: Fair Standing balance comment: Statically able to stand without UE support but requires BUE support for stabilidy with functional mobility.                            ADL either performed or assessed with clinical judgement   ADL Overall ADL's : Needs assistance/impaired Eating/Feeding: Set up;Sitting   Grooming: Supervision/safety;Sitting   Upper Body Bathing: Sitting;Set up   Lower Body Bathing: Min guard;Sit to/from stand   Upper Body Dressing : Set up;Sitting   Lower Body Dressing: Min guard;Sit to/from stand   Toilet Transfer: Min guard;Ambulation;RW Toilet Transfer Details (indicate cue type and reason): Hands on guarding assist for safety when powering up to standing.  Toileting- Water quality scientist and Hygiene: Min guard;Sit to/from stand       Functional mobility during ADLs: Min guard;Rolling walker General ADL Comments: Pt with decreased activity tolerance for ADL and limited ability to participate secondary to generalized weakness.      Vision Patient Visual Report: No change from baseline Vision Assessment?: No apparent visual deficits     Perception     Praxis      Pertinent Vitals/Pain Pain Assessment: No/denies pain     Hand Dominance Right   Extremity/Trunk Assessment Upper Extremity Assessment Upper Extremity Assessment: Generalized weakness   Lower Extremity Assessment Lower Extremity Assessment: Generalized weakness       Communication Communication Communication: No difficulties   Cognition Arousal/Alertness: Awake/alert Behavior During Therapy: WFL for tasks assessed/performed Overall Cognitive Status: Within Functional Limits for tasks assessed  General Comments       Exercises     Shoulder Instructions      Home Living Family/patient expects to be discharged to:: Private residence(independent living [facility]) Living Arrangements: Spouse/significant other Available Help at Discharge: Family Type of Home: Independent living facility([facility]) Home Access: Level entry     Home Layout: One level     Bathroom Shower/Tub: (tub with door for walk in access)         Home Equipment: Walker - 2 wheels          Prior Functioning/Environment Level of Independence: Independent with assistive device(s)        Comments: Has used RW since cholecystectomy several weeks ago.        OT Problem List: Decreased strength;Decreased range of motion;Impaired balance (sitting and/or standing);Decreased activity tolerance;Decreased safety awareness;Decreased knowledge of use of DME or AE;Decreased knowledge of precautions;Decreased cognition;Cardiopulmonary status limiting activity      OT Treatment/Interventions:      OT Goals(Current goals can be found in the care plan section) Acute Rehab OT Goals Patient Stated Goal: return home OT Goal Formulation: With patient/family Time For Goal Achievement: 09/12/17 Potential to Achieve Goals: Good  OT Frequency:     Barriers to D/C:            Co-evaluation              AM-PAC PT "6 Clicks" Daily Activity     Outcome Measure Help from another person eating meals?: A Little Help from another person taking care of personal grooming?: A Little Help from another person toileting, which includes using toliet, bedpan, or urinal?: A Little Help from another person bathing (including washing, rinsing, drying)?: A Little Help from another person to put on and taking off regular upper body clothing?: A Little Help from another person to put on and taking off regular lower body clothing?: A Little 6 Click Score: 18   End of Session Equipment Utilized During Treatment: Rolling walker  Activity Tolerance: Patient tolerated treatment well Patient left: with call bell/phone within reach;in bed;with bed alarm set;with family/visitor  present  OT Visit Diagnosis: Other abnormalities of gait and mobility (R26.89);Muscle weakness (generalized) (M62.81)                Time: 5093-2671 OT Time Calculation (min): 26 min Charges:  OT General Charges $OT Visit: 1 Visit OT Evaluation $OT Eval Moderate Complexity: 1 Mod OT Treatments $Self Care/Home Management : 8-22 mins G-Codes:     Norman Herrlich, MS OTR/L  Pager: Stuart A Kyser Wandel 08/29/2017, 3:13 PM

## 2017-08-29 NOTE — Discharge Summary (Addendum)
Physician Discharge Summary  [name]  NWG:[id number]  DOB: April 25, 1922  DOA: 08/25/2017 PCP: Marletta Lor, MD  Admit date: 08/25/2017 Discharge date: 08/30/2017  Admitted From: Home Disposition: SNF  Recommendations for Outpatient Follow-up:  1. Follow up with PCP in 1-2 weeks 2. Follow-up with cardiology in 2 weeks - cardiology arranged appointment 3. Please obtain BMP/CBC in one week to monitor globin and creatinine  Discharge Condition: Stable CODE STATUS: Full code Diet recommendation: Heart Healthy   Brief/Interim Summary: For full details see H&P/Progress note, but in brief, [name] is a 84 year old female with past medical history of hypertension, breast cancer in remission who had a recent cholecystectomy 10 days ago sent by physical therapist due to tachycardia.  Upon evaluation she was found to be in A. fib with RVR and patient was admitted for further management.  Patient was started on Cardizem drip, cardiology was consulted and subsequently she was transitioned to metoprolol 50 mg twice daily.  Heart rate improved.  Upon workup was found that patient had possible NSTEMI cardiologist recommending conservative treatment for now with medical management.  Patient has clinically improved work with PT and recommended SNF for short-term rehab.  Patient deemed stable for discharge.  Subjective: Patient seen and examined, with family at bedside.  She complains of some chest congestion, and feeling weak overall.  Denies chest pain.  Has not been out of bed much.  Heart rate elevated overnight but quickly returned to acceptable levels.  Felt to be related to albuterol treatment.  Patient remains afebrile, tolerating diet well.  Discharge Diagnoses/Hospital Course:  A. fib with RVR - rhythm improved New onset Felt to be due to NSTEMI, echo shows apical akinesis new from last echo performed on January.  She was initially treated with Cardizem drip, now transitioned to  beta-blocker, c/w metoprolol 50 mg twice daily. CHADsVAsC 3, she was initially treated with heparin drip, transitioned to Eliquis. Ambulated well with no issues and heart rate.  Follow-up with cardiology in 2 weeks  NSTEMI Cardiology recommending conservative management, continue beta-blocker, Plavix and statin.  No cardiac cath at this moment, but could be consider if patient becomes symptomatic. Heparin drip discontinued, transitioned to Eliquis.  No ASA given risks of GI bleed.  Follow-up with cardiology in 1-2 weeks  Diastolic heart failure - stable Seems to be compensated at this time Echo shows LVEF 50-55% apical akinesis, grade 2 diastolic dysfunction Will continue Lasix for 1 more week then to discuss with cardiology further regimen  Acute renal failure on CKD stage III Baseline creatinine 1.12 weeks ago, upon admission 1.5 Likely prerenal Creatinine seems to have plateaued at around 1.3 Monitor BMP in 1 week  Hypertension BP stable she is on beta-blocker at this point  Continue to monitor  URI Continue supportive treatment with Flonase and Robitussin  Deconditioning PT evaluated patient felt to be unsteady and recommended SNF for short-term rehab  All other chronic medical condition were stable during the hospitalization.  Patient was seen by physical therapy, recommended SNF  On the day of the discharge the patient's vitals were stable, and no other acute medical condition were reported by patient. the patient was felt safe to be discharge to SNF  Discharge Instructions  You were cared for by a hospitalist during your hospital stay. If you have any questions about your discharge medications or the care you received while you were in the hospital after you are discharged, you can call the unit and asked to  speak with the hospitalist on call if the hospitalist that took care of you is not available. Once you are discharged, your primary care physician will handle any  further medical issues. Please note that NO REFILLS for any discharge medications will be authorized once you are discharged, as it is imperative that you return to your primary care physician (or establish a relationship with a primary care physician if you do not have one) for your aftercare needs so that they can reassess your need for medications and monitor your lab values.  Discharge Instructions    Call MD for:  difficulty breathing, headache or visual disturbances   Complete by:  As directed    Call MD for:  extreme fatigue   Complete by:  As directed    Call MD for:  hives   Complete by:  As directed    Call MD for:  persistant dizziness or light-headedness   Complete by:  As directed    Call MD for:  persistant nausea and vomiting   Complete by:  As directed    Call MD for:  redness, tenderness, or signs of infection (pain, swelling, redness, odor or green/yellow discharge around incision site)   Complete by:  As directed    Call MD for:  severe uncontrolled pain   Complete by:  As directed    Call MD for:  temperature >100.4   Complete by:  As directed    Diet - low sodium heart healthy   Complete by:  As directed    Increase activity slowly   Complete by:  As directed      Allergies as of 08/29/2017      Reactions   Celecoxib Swelling   REACTION: Swelling-leg      Medication List    STOP taking these medications   azelastine 0.1 % nasal spray Commonly known as:  ASTELIN   mometasone 50 MCG/ACT nasal spray Commonly known as:  NASONEX   triamcinolone cream 0.1 % Commonly known as:  KENALOG     TAKE these medications   apixaban 5 MG Tabs tablet Commonly known as:  ELIQUIS Take 1 tablet (5 mg total) by mouth 2 (two) times daily.   atorvastatin 80 MG tablet Commonly known as:  LIPITOR Take 1 tablet (80 mg total) by mouth daily at 6 PM.   CALCIUM 600 + D PO Take 1 tablet by mouth daily.   clopidogrel 75 MG tablet Commonly known as:  PLAVIX Take 1 tablet  (75 mg total) by mouth daily. Start taking on:  08/30/2017   fluticasone 50 MCG/ACT nasal spray Commonly known as:  FLONASE Place 2 sprays into both nostrils daily. What changed:    when to take this  reasons to take this   furosemide 40 MG tablet Commonly known as:  LASIX Take 0.5 tablets (20 mg total) by mouth daily. What changed:  how much to take   guaiFENesin-dextromethorphan 100-10 MG/5ML syrup Commonly known as:  ROBITUSSIN DM Take 5 mLs by mouth every 4 (four) hours as needed for cough (chest congestion).   ketoconazole 2 % shampoo Commonly known as:  NIZORAL Apply 1 application topically every 7 (seven) days.   loratadine 10 MG tablet Commonly known as:  CLARITIN Take 10 mg by mouth daily.   metoprolol tartrate 50 MG tablet Commonly known as:  LOPRESSOR Take 1 tablet (50 mg total) by mouth 2 (two) times daily.   nystatin-triamcinolone cream Commonly known as:  MYCOLOG II Apply 1 application  topically 2 (two) times daily. What changed:    when to take this  reasons to take this   pantoprazole 40 MG tablet Commonly known as:  PROTONIX Take 40 mg by mouth daily.   PRESERVISION AREDS 2 Caps Take 1 capsule by mouth 2 (two) times daily.      Follow-up Information    Marletta Lor, MD. Go on 09/01/2017.   Specialty:  Internal Medicine Why:  @4pm  Contact information: [place] [address] [phone]        Sanda Klein, MD Follow up.   Specialty:  Cardiology Why:  Office will conatct you for follow up Contact information: [address] [place] [address] [phone]          Allergies  Allergen Reactions  . Celecoxib Swelling    REACTION: Swelling-leg    Consultations:  Cardiology   Procedures/Studies: Dg Chest 2 View  Result Date: 08/25/2017 CLINICAL DATA:  Atrial fibrillation. Shortness of breath beginning today. EXAM: CHEST  2 VIEW COMPARISON:  08/12/2017 FINDINGS: Heart size  upper limits of normal. Chronic aortic atherosclerosis. Pulmonary venous hypertension with early interstitial edema. No consolidation or collapse. Tiny amount of pleural fluid in the posterior costophrenic angles. IMPRESSION: Pulmonary venous hypertension with early interstitial edema. Electronically Signed   By: Nelson Chimes M.D.   On: 08/25/2017 20:11   Dg Chest 2 View  Result Date: 08/12/2017 CLINICAL DATA:  History of breast carcinoma. Nausea and vomiting. Fever. EXAM: CHEST  2 VIEW COMPARISON:  Nov 28, 2015 FINDINGS: There is no edema or consolidation. Heart is mildly enlarged with pulmonary vascularity within normal limits. There is aortic atherosclerosis. No adenopathy. No blastic or lytic bone lesions. There is degenerative change in the thoracic spine. IMPRESSION: No edema or consolidation. Heart mildly enlarged. There is aortic atherosclerosis. Aortic Atherosclerosis (ICD10-I70.0). Electronically Signed   By: Lowella Grip III M.D.   On: 08/12/2017 08:40   Dg Cholangiogram Operative  Result Date: 08/13/2017 CLINICAL DATA:  84 year old female with a history of cholecystitis EXAM: INTRAOPERATIVE CHOLANGIOGRAM TECHNIQUE: Cholangiographic images from the C-arm fluoroscopic device were submitted for interpretation post-operatively. Please see the procedural report for the amount of contrast and the fluoroscopy time utilized. COMPARISON:  Ultrasound 08/12/2017 FINDINGS: Surgical instruments project over the upper abdomen. There is cannulation of the cystic duct/gallbladder neck, with antegrade infusion of contrast. Caliber of the extrahepatic ductal system borderline enlarged, though not unusual for a patient of this age. No definite filling defect within the extrahepatic ducts identified. Free flow of contrast across the ampulla. IMPRESSION: Intraoperative cholangiogram demonstrates mild dilation of the extrahepatic biliary ducts, with no definite filling defects identified. Free flow of contrast  across the ampulla. Please refer to the dictated operative report for full details of intraoperative findings and procedure Electronically Signed   By: Corrie Mckusick D.O.   On: 08/13/2017 15:07   Ct Angio Chest Pe W Or Wo Contrast  Result Date: 08/26/2017 CLINICAL DATA:  Tachycardia with elevated D-dimer. Recent cholecystectomy. History of breast carcinoma EXAM: CT ANGIOGRAPHY CHEST WITH CONTRAST TECHNIQUE: Multidetector CT imaging of the chest was performed using the standard protocol during bolus administration of intravenous contrast. Multiplanar CT image reconstructions and MIPs were obtained to evaluate the vascular anatomy. CONTRAST:  5mL ISOVUE-370 IOPAMIDOL (ISOVUE-370) INJECTION 76% COMPARISON:  Chest radiograph August 25, 2017 FINDINGS: Cardiovascular: There is no demonstrable pulmonary embolus. There is no thoracic aortic aneurysm. No dissection is evident. It must be cautioned that the contrast  bolus is not targeted toward the aorta, in the contrast bolus in the aorta is not felt to be sufficient to exclude possible dissection from an imaging standpoint. There is aortic atherosclerosis. There is calcification in the proximal visualized great vessels as well as foci of coronary artery calcification evident. There is a small pericardial effusion. Pericardium does not appear appreciably thickened. There is mitral annular calcification. Mediastinum/Nodes: Visualized thyroid appears unremarkable. There is a lymph node to the right of the distal trachea anteriorly measuring 1.7 x 1.5 cm. There is a lymph node to the left of the carina measuring 1.8 x 1.2 cm. There are scattered subcentimeter mediastinal lymph nodes as well. No esophageal lesions are evident. Lungs/Pleura: There are moderate pleural effusions bilaterally. There is patchy airspace consolidation in posterior lung bases, largely due to compressive atelectasis. A degree of superimposed pneumonia in the bases cannot be excluded, however. There  is a degree of interstitial edema in the bases as well. There is an azygos lobe on the right, an anatomic variant. Upper Abdomen: In the visualized upper abdomen, there is aortic atherosclerosis. 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 Visualized upper abdominal structures otherwise appear unremarkable. Musculoskeletal: There is degenerative change in the thoracic spine with diffuse idiopathic skeletal hyperostosis. There is anterior wedging of the L2 vertebral body, incompletely visualized. There are no blastic or lytic bone lesions. Review of the MIP images confirms the above findings. IMPRESSION: 1.  No demonstrable pulmonary embolus. 2. No thoracic aortic aneurysm. There is extensive aortic and great vessel calcification as well as foci of coronary artery calcification. No dissection evident; note that the contrast bolus is not sufficient to exclude this entity from an imaging standpoint. 3. Pleural effusions bilaterally with compressive atelectasis in both lung bases. There may be superimposed pneumonia in the lung bases. There is bibasilar interstitial edema. There is felt to be a degree of underlying congestive heart failure. 4. Mildly enlarged pericarinal region lymph nodes. Suspect reactive etiology given the parenchymal lung changes. 5.  Small pericardial effusion. 6. 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 Aortic Atherosclerosis (ICD10-I70.0). Electronically Signed   By: Lowella Grip III M.D.   On: 08/26/2017 17:21   Dg Chest Port 1 View  Result Date: 08/29/2017 CLINICAL DATA:  84 year old female with shortness breath and cough. Breast cancer post lumpectomies 6 years ago. Subsequent encounter. EXAM: PORTABLE CHEST 1 VIEW COMPARISON:  08/26/2017 chest CT. 08/25/2017 and 08/12/2017 chest x-ray. FINDINGS: Interval slight decrease in degree of pulmonary vascular  congestion. CT detected pleural effusions and basal atelectasis not as well delineated on the present plain film exam. Cardiomegaly. Calcified aorta. Curvature thoracic spine. IMPRESSION: Interval slight decrease in degree of pulmonary vascular congestion. CT noted pleural effusions and basilar atelectasis not as well delineated on present plain film exam. Cardiomegaly. Aortic Atherosclerosis (ICD10-I70.0). Electronically Signed   By: Genia Del M.D.   On: 08/29/2017 09:57   US Abdomen Limited Ruq  Result Date: 08/12/2017 CLINICAL DATA:  Elevated liver enzymes with nausea and vomiting EXAM: ULTRASOUND ABDOMEN LIMITED RIGHT UPPER QUADRANT COMPARISON:  None. FINDINGS: Gallbladder: Within the gallbladder, there are echogenic foci which move and shadow consistent with cholelithiasis. Largest individual gallstone measures 9 mm in length. Gallbladder wall is mildly thickened with subtle edema. No pericholecystic fluid. No sonographic Murphy sign noted by sonographer. Common bile duct: Diameter: 8 mm, mildly prominent. No biliary duct mass  or calculus evident. Liver: No focal lesion identified. Within normal limits in parenchymal echogenicity. Portal vein is patent on color Doppler imaging with normal direction of blood flow towards the liver. IMPRESSION: Cholelithiasis with mild gallbladder wall thickening. Gallbladder wall appears subtly edematous. These are findings concerning for early acute cholecystitis. It may be prudent in this regard to consider nuclear medicine hepatobiliary imaging study to assess for cystic duct patency. Mild prominence of the common bile duct without mass or calculus evident. Electronically Signed   By: Lowella Grip III M.D.   On: 08/12/2017 08:13    Echocardiogram 08/26/17  Study Conclusions  - Left ventricle: The cavity size was normal. Wall thickness was   increased in a pattern of mild LVH. Systolic function was normal.   The estimated ejection fraction was in the range  of 50% to 55%.   There is akinesis of the apical myocardium. Features are   consistent with a pseudonormal left ventricular filling pattern,   with concomitant abnormal relaxation and increased filling   pressure (grade 2 diastolic dysfunction). - Aortic valve: Trileaflet; mildly thickened, mildly calcified   leaflets. There was mild regurgitation. - Mitral valve: Severely calcified annulus. Mildly thickened,   mildly calcified leaflets . There was moderate regurgitation. - Left atrium: The atrium was mildly to moderately dilated.   Volume/bsa, S: 43.1 ml/m^2. - Tricuspid valve: There was moderate regurgitation. - Pulmonary arteries: Systolic pressure was moderately increased.  Impressions:  - Apical akinesis is new when compared to prior study.  Discharge Exam: Vitals:   08/29/17 1926 08/30/17 0623  BP: 140/62 (!) 154/59  Pulse: 86 93  Resp: 18 18  Temp: 99.6 F (37.6 C) 98.7 F (37.1 C)  SpO2: 100% 97%   Vitals:   08/28/17 1638 08/28/17 1918 08/29/17 0518 08/29/17 1205  BP: (!) 134/41 (!) 131/36 (!) 138/46 (!) 130/47  Pulse: 70 73 83 73  Resp:  18 18 18   Temp:  98.9 F (37.2 C) 98.1 F (36.7 C) 99.3 F (37.4 C)  TempSrc:  Oral Oral Oral  SpO2:  95% 98% 96%  Weight:   72.4 kg (159 lb 9.6 oz)   Height:        General: NAD  Cardiovascular: RRR, S1/S2 + Respiratory: CTA bilaterally, transmitted sounds from upper airway Extremities: no edema   The results of significant diagnostics from this hospitalization (including imaging, microbiology, ancillary and laboratory) are listed below for reference.     Microbiology: No results found for this or any previous visit (from the past 240 hour(s)).   Labs: BNP (last 3 results) No results for input(s): BNP in the last 8760 hours. Basic Metabolic Panel: Recent Labs  Lab 08/25/17 1751 08/26/17 0242 08/27/17 0636 08/28/17 0746 08/29/17 0629  NA 141 139 138 137 139  K 4.7 4.3 4.1 4.0 3.9  CL 104 106 104 103 102   CO2 21* 21* 22 23 24   GLUCOSE 158* 126* 151* 135* 129*  BUN 34* 27* 25* 24* 20  CREATININE 1.53* 1.27* 1.35* 1.36* 1.32*  CALCIUM 8.7* 8.2* 8.3* 8.0* 8.3*   Liver Function Tests: No results for input(s): AST, ALT, ALKPHOS, BILITOT, PROT, ALBUMIN in the last 168 hours. No results for input(s): LIPASE, AMYLASE in the last 168 hours. No results for input(s): AMMONIA in the last 168 hours. CBC: Recent Labs  Lab 08/25/17 1751 08/26/17 0242 08/27/17 0636  WBC 11.1* 9.5 8.7  NEUTROABS 8.7*  --   --   HGB 11.9* 10.5* 11.3*  HCT 37.6 34.2* 35.9*  MCV 92.8 92.2 92.3  PLT 428* 379 364   Cardiac Enzymes: Recent Labs  Lab 08/25/17 1751 08/25/17 2330 08/26/17 0242 08/26/17 0934  TROPONINI 0.65* 6.39* 7.30* 4.21*   BNP: Invalid input(s): POCBNP CBG: No results for input(s): GLUCAP in the last 168 hours. D-Dimer No results for input(s): DDIMER in the last 72 hours. Hgb A1c No results for input(s): HGBA1C in the last 72 hours. Lipid Profile No results for input(s): CHOL, HDL, LDLCALC, TRIG, CHOLHDL, LDLDIRECT in the last 72 hours. Thyroid function studies No results for input(s): TSH, T4TOTAL, T3FREE, THYROIDAB in the last 72 hours.  Invalid input(s): FREET3 Anemia work up No results for input(s): VITAMINB12, FOLATE, FERRITIN, TIBC, IRON, RETICCTPCT in the last 72 hours. Urinalysis    Component Value Date/Time   COLORURINE YELLOW 08/12/2017 [place] 08/12/2017 0627   LABSPEC 1.014 08/12/2017 0627   PHURINE 6.0 08/12/2017 0627   GLUCOSEU NEGATIVE 08/12/2017 0627   HGBUR SMALL (A) 08/12/2017 0627   HGBUR trace-intact 10/28/2009 0905   BILIRUBINUR NEGATIVE 08/12/2017 0627   KETONESUR NEGATIVE 08/12/2017 0627   PROTEINUR NEGATIVE 08/12/2017 0627   UROBILINOGEN 0.2 10/28/2009 0905   NITRITE NEGATIVE 08/12/2017 0627   LEUKOCYTESUR TRACE (A) 08/12/2017 0627   Sepsis Labs Invalid input(s): PROCALCITONIN,  WBC,  LACTICIDVEN Microbiology No results found  for this or any previous visit (from the past 240 hour(s)).   Time coordinating discharge: 32 minutes  SIGNED:  Chipper Oman, MD  [facility] 08/30/2017, 1:28 PM  Pager please text page via  [email or web address]

## 2017-08-30 ENCOUNTER — Ambulatory Visit: Payer: MEDICARE | Admitting: Podiatry

## 2017-08-30 DIAGNOSIS — R29898 Other symptoms and signs involving the musculoskeletal system: Secondary | ICD-10-CM | POA: Diagnosis not present

## 2017-08-30 DIAGNOSIS — K219 Gastro-esophageal reflux disease without esophagitis: Secondary | ICD-10-CM | POA: Diagnosis not present

## 2017-08-30 DIAGNOSIS — E782 Mixed hyperlipidemia: Secondary | ICD-10-CM | POA: Diagnosis not present

## 2017-08-30 DIAGNOSIS — I5032 Chronic diastolic (congestive) heart failure: Secondary | ICD-10-CM | POA: Diagnosis not present

## 2017-08-30 DIAGNOSIS — R1312 Dysphagia, oropharyngeal phase: Secondary | ICD-10-CM | POA: Diagnosis not present

## 2017-08-30 DIAGNOSIS — M79676 Pain in unspecified toe(s): Secondary | ICD-10-CM | POA: Diagnosis not present

## 2017-08-30 DIAGNOSIS — R55 Syncope and collapse: Secondary | ICD-10-CM | POA: Diagnosis not present

## 2017-08-30 DIAGNOSIS — I4891 Unspecified atrial fibrillation: Secondary | ICD-10-CM | POA: Diagnosis not present

## 2017-08-30 DIAGNOSIS — M6281 Muscle weakness (generalized): Secondary | ICD-10-CM | POA: Diagnosis not present

## 2017-08-30 DIAGNOSIS — R05 Cough: Secondary | ICD-10-CM | POA: Diagnosis not present

## 2017-08-30 DIAGNOSIS — M150 Primary generalized (osteo)arthritis: Secondary | ICD-10-CM | POA: Diagnosis not present

## 2017-08-30 DIAGNOSIS — I10 Essential (primary) hypertension: Secondary | ICD-10-CM | POA: Diagnosis not present

## 2017-08-30 DIAGNOSIS — R5381 Other malaise: Secondary | ICD-10-CM | POA: Diagnosis not present

## 2017-08-30 DIAGNOSIS — R748 Abnormal levels of other serum enzymes: Secondary | ICD-10-CM | POA: Diagnosis not present

## 2017-08-30 DIAGNOSIS — B351 Tinea unguium: Secondary | ICD-10-CM | POA: Diagnosis not present

## 2017-08-30 DIAGNOSIS — N179 Acute kidney failure, unspecified: Secondary | ICD-10-CM | POA: Diagnosis not present

## 2017-08-30 DIAGNOSIS — R2681 Unsteadiness on feet: Secondary | ICD-10-CM | POA: Diagnosis not present

## 2017-08-30 DIAGNOSIS — Z7189 Other specified counseling: Secondary | ICD-10-CM | POA: Diagnosis not present

## 2017-08-30 DIAGNOSIS — N183 Chronic kidney disease, stage 3 (moderate): Secondary | ICD-10-CM | POA: Diagnosis not present

## 2017-08-30 DIAGNOSIS — I5030 Unspecified diastolic (congestive) heart failure: Secondary | ICD-10-CM | POA: Diagnosis not present

## 2017-08-30 DIAGNOSIS — Q828 Other specified congenital malformations of skin: Secondary | ICD-10-CM | POA: Diagnosis not present

## 2017-08-30 DIAGNOSIS — I214 Non-ST elevation (NSTEMI) myocardial infarction: Secondary | ICD-10-CM | POA: Diagnosis not present

## 2017-08-30 DIAGNOSIS — E8809 Other disorders of plasma-protein metabolism, not elsewhere classified: Secondary | ICD-10-CM | POA: Diagnosis not present

## 2017-08-30 DIAGNOSIS — J309 Allergic rhinitis, unspecified: Secondary | ICD-10-CM | POA: Diagnosis not present

## 2017-08-30 DIAGNOSIS — E441 Mild protein-calorie malnutrition: Secondary | ICD-10-CM | POA: Diagnosis not present

## 2017-08-30 DIAGNOSIS — D689 Coagulation defect, unspecified: Secondary | ICD-10-CM | POA: Diagnosis not present

## 2017-08-30 DIAGNOSIS — E7800 Pure hypercholesterolemia, unspecified: Secondary | ICD-10-CM | POA: Diagnosis not present

## 2017-08-30 DIAGNOSIS — I480 Paroxysmal atrial fibrillation: Secondary | ICD-10-CM | POA: Diagnosis not present

## 2017-08-30 MED ORDER — METOPROLOL TARTRATE 25 MG PO TABS: 75.0000 mg | ORAL_TABLET | Freq: Two times a day (BID) | ORAL | Status: DC

## 2017-08-30 MED ORDER — METOPROLOL TARTRATE 75 MG PO TABS: 75.0000 mg | ORAL_TABLET | Freq: Two times a day (BID) | ORAL | 3 refills | Status: DC

## 2017-08-30 NOTE — Clinical Social Work Note (Signed)
CSW left voicemail for admissions coordinator. Will see when patient can transport to facility when she calls back.  Dayton Scrape, [place]

## 2017-08-30 NOTE — Progress Notes (Signed)
Report was given to [facility], at [facility] [phone]).  Family will be transporting pt.  Pt is in no acute distress and awaiting to be discharge after she eats lunch. IV has been removed without complication.  CCMD has been notified of discharge. Discharge instructions have been reviewed with family and they have no further questions at this time.

## 2017-08-30 NOTE — Progress Notes (Signed)
Family requested that pt's Left arm be assessed.  It appears red where blood pressure cuff had been, and there was a small skin abrasion at the [facility].  Abrasion was cleaned, betadine and gauze dressing applied.

## 2017-08-30 NOTE — Consult Note (Signed)
   [facility] CM Inpatient Consult   08/30/2017  MAHIMA HOTTLE 1922/01/09 [id number]  Patient screened for re-admission [facility] Care Management needs in the Medicare ACO. Admitted with [facility].  Chart review reveals will return to [facility].  Patient to return to SNF. No [facility] Care Management needs at this time.  Natividad Brood, RN BSN [facility] Liaison  [phone] business mobile phone Toll free office [phone]

## 2017-08-30 NOTE — Progress Notes (Signed)
Physical Therapy Treatment Patient Details Name: [name] MRN: [id number] DOB: January 30, 1922 Today's Date: 08/30/2017    History of Present Illness Pt adm with afib with RVR and NSTEMI. PMH - HTN, breast CA, recent cholecystectomy    PT Comments    Pt performed gait and funcitonal mobility with cues for safety and assistance provided due to decreased strength.  Pt required transfer to toilet to void, noted to have instance of urinary incontinence pre tx.  Plan for [facility] remains appropriate to improve strength and function before returning home.    Follow Up Recommendations  SNF     Equipment Recommendations  None recommended by PT    Recommendations for Other Services       Precautions / Restrictions Precautions Precautions: Fall Restrictions Weight Bearing Restrictions: No    Mobility  Bed Mobility Overal bed mobility: Needs Assistance Bed Mobility: Supine to Sit     Supine to sit: Supervision;HOB elevated     General bed mobility comments: Increased time without physical assistance and use of rail.    Transfers Overall transfer level: Needs assistance Equipment used: Rolling walker (2 wheeled) Transfers: Sit to/from Stand Sit to Stand: Min assist         General transfer comment: Cues for hand placement to and from seated surface.  Pt unsteady in standing.    Ambulation/Gait Ambulation/Gait assistance: Min assist Ambulation Distance (Feet): 120 Feet Assistive device: Rolling walker (2 wheeled) Gait Pattern/deviations: Step-through pattern;Decreased stride length;Trunk flexed Gait velocity: decr Gait velocity interpretation: Below normal speed for age/gender General Gait Details: Assist for safety and balance. Pt slightly unsteady. Stability improved with incr distance. Cues for stepping closer to RW for safe position and gazing forward to improve posture.     Stairs            Wheelchair Mobility    Modified Rankin (Stroke Patients Only)        Balance     Sitting balance-Leahy Scale: Fair       Standing balance-Leahy Scale: Poor                              Cognition Arousal/Alertness: Awake/alert Behavior During Therapy: WFL for tasks assessed/performed Overall Cognitive Status: Within Functional Limits for tasks assessed                                        Exercises General Exercises - Lower Extremity Ankle Circles/Pumps: AROM;Both;10 reps;20 reps;Supine Quad Sets: AROM;Both;10 reps;Supine Heel Slides: AROM;Both;10 reps;Supine Hip ABduction/ADduction: AROM;Both;10 reps;Supine Straight Leg Raises: AROM;Both;10 reps;Supine    General Comments        Pertinent Vitals/Pain Pain Assessment: 0-10 Pain Score: 3  Pain Location: L arm ( antecubital space) Pain Descriptors / Indicators: Burning;Sore Pain Intervention(s): Monitored during session;Repositioned    Home Living                      Prior Function            PT Goals (current goals can now be found in the care plan section) Acute Rehab PT Goals Patient Stated Goal: return home Potential to Achieve Goals: Good Progress towards PT goals: Progressing toward goals    Frequency    Min 3X/week      PT Plan Current plan remains appropriate    Co-evaluation  AM-PAC PT "6 Clicks" Daily Activity  Outcome Measure  Difficulty turning over in bed (including adjusting bedclothes, sheets and blankets)?: A Little Difficulty moving from lying on back to sitting on the side of the bed? : A Little Difficulty sitting down on and standing up from a chair with arms (e.g., wheelchair, bedside commode, etc,.)?: Unable Help needed moving to and from a bed to chair (including a wheelchair)?: A Little Help needed walking in hospital room?: A Little Help needed climbing 3-5 steps with a railing? : A Lot 6 Click Score: 15    End of Session Equipment Utilized During Treatment: Gait belt Activity  Tolerance: Patient limited by fatigue Patient left: in chair;with call bell/phone within reach;with family/visitor present Nurse Communication: Mobility status(informed nursing of pain in LUE and informed tech of patient voiding in bed.  ) PT Visit Diagnosis: Unsteadiness on feet (R26.81);Muscle weakness (generalized) (M62.81)     Time: 5697-9480 PT Time Calculation (min) (ACUTE ONLY): 31 min  Charges:  $Gait Training: 8-22 mins $Therapeutic Activity: 8-22 mins                    G Codes:       Governor Rooks, PTA pager [phone]'    [name] 08/30/2017, 11:35 AM

## 2017-08-30 NOTE — Progress Notes (Signed)
Progress Note  Patient Name: [name] Date of Encounter: 08/30/2017  Primary Cardiologist: No primary care provider on file.   Subjective   Feels well, but did fair with physical therapy but will need some inpatient rehab. Had a very brief episode of paroxysmal atrial fibrillation with rapid ventricular response last night, asymptomatic.  Inpatient Medications    Scheduled Meds: . apixaban  5 mg Oral BID  . atorvastatin  80 mg Oral q1800  . clopidogrel  75 mg Oral Daily  . fluticasone  1 spray Each Nare BID  . furosemide  40 mg Oral Daily  . metoprolol tartrate  75 mg Oral BID  . pantoprazole  40 mg Oral Daily   Continuous Infusions:  PRN Meds: acetaminophen **OR** acetaminophen, guaiFENesin-dextromethorphan, ondansetron **OR** ondansetron (ZOFRAN) IV   Vital Signs    Vitals:   08/29/17 1205 08/29/17 1600 08/29/17 1926 08/30/17 0623  BP: (!) 130/47  140/62 (!) 154/59  Pulse: 73  86 93  Resp: 18  18 18   Temp: 99.3 F (37.4 C)  99.6 F (37.6 C) 98.7 F (37.1 C)  TempSrc: Oral  Oral Oral  SpO2: 96% 94% 100% 97%  Weight:    158 lb 11.2 oz (72 kg)  Height:        Intake/Output Summary (Last 24 hours) at 08/30/2017 1007 Last data filed at 08/29/2017 1933 Gross per 24 hour  Intake 415 ml  Output 400 ml  Net 15 ml   Filed Weights   08/28/17 0603 08/29/17 0518 08/30/17 0623  Weight: 167 lb 8.8 oz (76 kg) 159 lb 9.6 oz (72.4 kg) 158 lb 11.2 oz (72 kg)    Telemetry    Atrial fibrillation rapid ventricular response, average rate 140 bpm- Personally Reviewed  ECG    No new tracing- Personally Reviewed  Physical Exam  Looks comfortable GEN: No acute distress.   Neck: No JVD Cardiac: RRR, no murmurs, rubs, or gallops.  Respiratory: Clear to auscultation bilaterally. GI: Soft, nontender, non-distended  MS: No edema; No deformity. Neuro:  Nonfocal  Psych: Normal affect   Labs    Chemistry Recent Labs  Lab 08/27/17 0636 08/28/17 0746  08/29/17 0629  NA 138 137 139  K 4.1 4.0 3.9  CL 104 103 102  CO2 22 23 24   GLUCOSE 151* 135* 129*  BUN 25* 24* 20  CREATININE 1.35* 1.36* 1.32*  CALCIUM 8.3* 8.0* 8.3*  GFRNONAA 33* 32* 34*  GFRAA 38* 38* 39*  ANIONGAP 12 11 13      Hematology Recent Labs  Lab 08/25/17 1751 08/26/17 0242 08/27/17 0636  WBC 11.1* 9.5 8.7  RBC 4.05 3.71* 3.89  HGB 11.9* 10.5* 11.3*  HCT 37.6 34.2* 35.9*  MCV 92.8 92.2 92.3  MCH 29.4 28.3 29.0  MCHC 31.6 30.7 31.5  RDW 14.1 14.3 14.4  PLT 428* 379 364    Cardiac Enzymes Recent Labs  Lab 08/25/17 1751 08/25/17 2330 08/26/17 0242 08/26/17 0934  TROPONINI 0.65* 6.39* 7.30* 4.21*   No results for input(s): TROPIPOC in the last 168 hours.   BNPNo results for input(s): BNP, PROBNP in the last 168 hours.   DDimer  Recent Labs  Lab 08/25/17 2330  DDIMER 2.48*     Radiology    Dg Chest Port 1 View  Result Date: 08/29/2017 CLINICAL DATA:  84 year old female with shortness breath and cough. Breast cancer post lumpectomies 6 years ago. Subsequent encounter. EXAM: PORTABLE CHEST 1 VIEW COMPARISON:  08/26/2017 chest CT. 08/25/2017 and  08/12/2017 chest x-ray. FINDINGS: Interval slight decrease in degree of pulmonary vascular congestion. CT detected pleural effusions and basal atelectasis not as well delineated on the present plain film exam. Cardiomegaly. Calcified aorta. Curvature thoracic spine. IMPRESSION: Interval slight decrease in degree of pulmonary vascular congestion. CT noted pleural effusions and basilar atelectasis not as well delineated on present plain film exam. Cardiomegaly. Aortic Atherosclerosis (ICD10-I70.0). Electronically Signed   By: Genia Del M.D.   On: 08/29/2017 09:57    Cardiac Studies   Echo 08/26/2017  - Left ventricle: The cavity size was normal. Wall thickness was increased in a pattern of mild LVH. Systolic function was normal. The estimated ejection fraction was in the range of 50% to 55%. There  is akinesis of the apical myocardium. Features are consistent with a pseudonormal left ventricular filling pattern, with concomitant abnormal relaxation and increased filling pressure (grade 2 diastolic dysfunction). - Aortic valve: Trileaflet; mildly thickened, mildly calcified leaflets. There was mild regurgitation. - Mitral valve: Severely calcified annulus. Mildly thickened, mildly calcified leaflets . There was moderate regurgitation. - Left atrium: The atrium was mildly to moderately dilated. Volume/bsa, S: 43.1 ml/m^2. - Tricuspid valve: There was moderate regurgitation. - Pulmonary arteries: Systolic pressure was moderately increased. Impressions: - Apical akinesis is new when compared to prior study. Patient Profile     84 y.o. female  with small apical NSTEMI, without angina or clinical signs of CHF, in the setting of recurrent paroxysmalatrial fibrillationwith RVR  Assessment & Plan    1. NSTEMI: Most likely demand ischemia in the setting of A. fib with RVR on the background of pre-existing LAD disease. Plan conservative management with clopidogrel, beta-blocker, statin.    Coronary angiography only if she becomes symptomatic with unstable angina pectoris. 2. CHF:  Appears clinically euvolemic.  Might have recovery of myocardial stunning and improved EF in the next few weeks.  On beta-blocker, but avoiding RAAS inhibitor due to low diastolic blood pressure and chronic kidney disease.  3. AF w RVR: Metoprolol dose increased to 75 mg twice daily for better rate control.  She remains unaware of the arrhythmia.  She is on oral anticoagulation.  Will use anticoagulant and clopidogrel together only for a limited amount of time due to increased bleeding risk. 4. LJQ:GBEEFEOFH well controlled.  As mentioned, her diastolic blood pressure is very low and I would avoid adding more agents 5. QRF:XJOITG has been added.  Repeat lipid profile in about 3 months.     For  questions or updates, please contact [place] Please consult [email or web address] for contact info under Cardiology/STEMI.      Signed, Sanda Klein, MD  08/30/2017, 10:07 AM

## 2017-08-30 NOTE — Plan of Care (Signed)
  Education: Knowledge of General Education information will improve 08/30/2017 1047 - Progressing by Imagene Gurney, RN

## 2017-08-30 NOTE — Clinical Social Work Note (Signed)
CSW facilitated patient discharge including contacting patient family and facility to confirm patient discharge plans. Clinical information faxed to facility and family agreeable with plan. Patient's husband and friend will transport patient by car back to [facility] after lunch. RN to call report prior to discharge [phone]).  CSW will sign off for now as social work intervention is no longer needed. Please consult [place] again if new needs arise.  Dayton Scrape, [place]

## 2017-08-31 ENCOUNTER — Telehealth: Payer: Self-pay | Admitting: Family Medicine

## 2017-08-31 ENCOUNTER — Non-Acute Institutional Stay (SKILLED_NURSING_FACILITY): Payer: MEDICARE | Admitting: Internal Medicine

## 2017-08-31 ENCOUNTER — Encounter: Payer: Self-pay | Admitting: Internal Medicine

## 2017-08-31 DIAGNOSIS — N183 Chronic kidney disease, stage 3 (moderate): Secondary | ICD-10-CM

## 2017-08-31 DIAGNOSIS — J309 Allergic rhinitis, unspecified: Secondary | ICD-10-CM

## 2017-08-31 DIAGNOSIS — I5030 Unspecified diastolic (congestive) heart failure: Secondary | ICD-10-CM | POA: Diagnosis not present

## 2017-08-31 DIAGNOSIS — R5381 Other malaise: Secondary | ICD-10-CM

## 2017-08-31 DIAGNOSIS — K219 Gastro-esophageal reflux disease without esophagitis: Secondary | ICD-10-CM

## 2017-08-31 DIAGNOSIS — I10 Essential (primary) hypertension: Secondary | ICD-10-CM

## 2017-08-31 DIAGNOSIS — I480 Paroxysmal atrial fibrillation: Secondary | ICD-10-CM

## 2017-08-31 DIAGNOSIS — I214 Non-ST elevation (NSTEMI) myocardial infarction: Secondary | ICD-10-CM | POA: Diagnosis not present

## 2017-08-31 DIAGNOSIS — N179 Acute kidney failure, unspecified: Secondary | ICD-10-CM | POA: Diagnosis not present

## 2017-08-31 DIAGNOSIS — Z7189 Other specified counseling: Secondary | ICD-10-CM

## 2017-08-31 NOTE — Progress Notes (Signed)
Provider:  Blanchie Serve MD  Location:  [facility] Room Number: 30 Place of Service:  SNF (31)  PCP: Marletta Lor, MD Patient Care Team: Marletta Lor, MD as PCP - General Erroll Luna, MD as Consulting Physician (General Surgery) Magrinat, Virgie Dad, MD as Consulting Physician (Oncology) Arloa Koh, MD as Consulting Physician (Radiation Oncology) [name], RN as Registered Nurse [name], RN as Registered Nurse [name], NP as Nurse Practitioner (Nurse Practitioner) [name], MD as Consulting Physician (Gastroenterology)  Extended Emergency Contact Information Primary Emergency Contact: Oneal Grout Address: [facility]          [address] [place]          [place], [place] [address] [place] Phone: [phone] Relation: Spouse  Code Status: DNR  Goals of Care: Advanced Directive information Advanced Directives 08/31/2017  Does Patient Have a Medical Advance Directive? Yes  Type of Advance Directive Living will  Does patient want to make changes to medical advance directive? -  Copy of [place] in Chart? -  Pre-existing out of facility DNR order (yellow form or pink MOST form) -      Chief Complaint  Patient presents with  . New Admit To SNF    New Admission Visit    HPI: Patient is a 84 y.o. female seen today for admission visit. She was in the hospital from 08/25/17-08/30/17 with atrial fibrillation with RVR. She was started on cardizem drip and cardiology was consulted. She was then transitioned to metoprolol 50 mg bid. She was noted to have NSTEMI on workup. Medical management was recommended. Echocardiogram showed apical akinesis which is a new finding for her with EF 50-55%. She was placed on heparin drip for anticoagulation and later switched to eliquis. She has medical history of HTN, breast cancer in remission, recent cholecystectomy.  She is seen in her room today.  Past Medical History:  Diagnosis Date  . Allergic rhinitis, seasonal   . Breast cancer of upper-outer quadrant of right female breast ([facility]) 09/13/2014  . Chronic venous insufficiency   . Dermatophytosis of nail   . Edema leg   . Family history of malignant neoplasm of breast   . Family history of malignant neoplasm of ovary   . Goiter    right thyroid nodule   . HTN (hypertension) 08/13/2017  . HX: breast cancer    bilateral  . Menopausal syndrome   . Osteoarthritis   . Osteoporosis   . Squamous cell skin cancer    right lower leg  . Wears glasses   . Wears hearing aid    both ears   Past Surgical History:  Procedure Laterality Date  . bilateral lumpectomies for breast cancer  Aug. 2007  . BREAST LUMPECTOMY WITH RADIOACTIVE SEED LOCALIZATION Right 10/01/2014   Procedure: BREAST LUMPECTOMY WITH RADIOACTIVE SEED LOCALIZATION;  Surgeon: Erroll Luna, MD;  Location: [facility];  Service: General;  Laterality: Right;  . CATARACT EXTRACTION    . CHOLECYSTECTOMY N/A 08/13/2017   Procedure: LAPAROSCOPIC CHOLECYSTECTOMY WITH INTRAOPERATIVE CHOLANGIOGRAM;  Surgeon: Excell Seltzer, MD;  Location: [facility];  Service: General;  Laterality: N/A;  . DILATION AND CURETTAGE OF UTERUS    . ESOPHAGOGASTRODUODENOSCOPY  07/26/2011   Procedure: ESOPHAGOGASTRODUODENOSCOPY (EGD);  Surgeon: Jeryl Columbia, MD;  Location: [facility];  Service: Endoscopy;  Laterality: N/A;  . history  of lumbar compression fracture    . left eardum sx  1983  . left knee arthroscopic surgery    . no screening colonoscopy    . s/p bilateral lumpectomies    . SAVORY DILATION  07/26/2011   Procedure: SAVORY DILATION;  Surgeon: Jeryl Columbia, MD;  Location: [facility];  Service: Endoscopy;  Laterality: N/A;  . status post resection squamous cell cancer right lower leg    . TONSILLECTOMY    . UMBILICAL HERNIA REPAIR      reports that  has never smoked. she has never used  smokeless tobacco. She reports that she does not drink alcohol or use drugs. Social History   Socioeconomic History  . Marital status: Married    Spouse name: Not on file  . Number of children: Not on file  . Years of education: Not on file  . Highest education level: Not on file  Social Needs  . Financial resource strain: Not on file  . Food insecurity - worry: Not on file  . Food insecurity - inability: Not on file  . Transportation needs - medical: Not on file  . Transportation needs - non-medical: Not on file  Occupational History  . Not on file  Tobacco Use  . Smoking status: Never Smoker  . Smokeless tobacco: Never Used  Substance and Sexual Activity  . Alcohol use: No  . Drug use: No  . Sexual activity: Not on file  Other Topics Concern  . Not on file  Social History Narrative  . Not on file    Functional Status Survey:    Family History  Problem Relation Age of Onset  . Cancer Mother 84       breast  . Heart attack Father   . Heart attack Brother   . Heart attack Brother   . Cancer Maternal Aunt 55       breast  . Coronary artery disease Other   . Cancer Other        ovarian; daughter of mat aunt w/ BC in 28s  . Heart disease Brother   . Cancer Maternal Aunt 60       breast    Health Maintenance  Topic Date Due  . TETANUS/TDAP  04/19/2015  . MAMMOGRAM  04/09/2017  . INFLUENZA VACCINE  Completed  . DEXA SCAN  Completed  . PNA vac Low Risk Adult  Completed    Allergies  Allergen Reactions  . Celecoxib Swelling    REACTION: Swelling-leg    Outpatient Encounter Medications as of 08/31/2017  Medication Sig  . acetaminophen (TYLENOL) 325 MG tablet Take 650 mg by mouth every 4 (four) hours as needed for mild pain or headache.  Marland Kitchen apixaban (ELIQUIS) 5 MG TABS tablet Take 1 tablet (5 mg total) by mouth 2 (two) times daily.  Marland Kitchen atorvastatin (LIPITOR) 80 MG tablet Take 1 tablet (80 mg total) by mouth daily at 6 PM.  . Calcium Carb-Cholecalciferol  (CALCIUM 600 + D PO) Take 1 tablet by mouth daily.   . fluticasone (FLONASE) 50 MCG/ACT nasal spray Place 2 sprays into both nostrils daily.  . furosemide (LASIX) 40 MG tablet Take 0.5 tablets (20 mg total) by mouth daily.  Marland Kitchen guaiFENesin-dextromethorphan (ROBITUSSIN DM) 100-10 MG/5ML syrup Take 5 mLs by mouth every 4 (four) hours as needed for cough (chest congestion).  Marland Kitchen ketoconazole (NIZORAL) 2 % shampoo Apply 1 application topically every 7 (seven) days.   Marland Kitchen loperamide (IMODIUM A-D) 2 MG tablet Take 2  mg by mouth as needed for diarrhea or loose stools.  Marland Kitchen loratadine (CLARITIN) 10 MG tablet Take 10 mg by mouth daily.  . metoprolol tartrate (LOPRESSOR) 50 MG tablet Take 50 mg by mouth 2 (two) times daily.  . Multiple Vitamins-Minerals (PRESERVISION AREDS 2) CAPS Take 2 capsules by mouth 2 (two) times daily.   . pantoprazole (PROTONIX) 40 MG tablet Take 40 mg by mouth daily.   . clopidogrel (PLAVIX) 75 MG tablet Take 1 tablet (75 mg total) by mouth daily.  . [DISCONTINUED] Metoprolol Tartrate 75 MG TABS Take 75 mg by mouth 2 (two) times daily.  . [DISCONTINUED] nystatin-triamcinolone (MYCOLOG II) cream Apply 1 application topically 2 (two) times daily. (Patient taking differently: Apply 1 application topically 2 (two) times daily as needed. )   No facility-administered encounter medications on file as of 08/31/2017.     Review of Systems  Constitutional: Positive for appetite change and fatigue. Negative for chills and fever.  HENT: Positive for congestion, hearing loss, postnasal drip and rhinorrhea. Negative for nosebleeds, sore throat and trouble swallowing.        Uses flonase, has hearing aids  Respiratory: Positive for cough. Negative for choking, shortness of breath and wheezing.   Cardiovascular: Negative for chest pain, palpitations and leg swelling.  Gastrointestinal: Negative for abdominal pain, blood in stool, constipation, nausea and vomiting.       3 loose bowel movement last  night, no blood or mucus in it. She also had one loose stool this am and none after that.   Genitourinary: Negative for dysuria, flank pain, hematuria and pelvic pain.  Musculoskeletal: Negative for arthralgias, back pain and gait problem.  Skin: Negative for rash and wound.  Neurological: Positive for weakness. Negative for dizziness, seizures, syncope, light-headedness and headaches.  Hematological: Bruises/bleeds easily.  Psychiatric/Behavioral: Negative for dysphoric mood, hallucinations, sleep disturbance and suicidal ideas. The patient is not nervous/anxious.     Vitals:   08/31/17 1513  BP: 128/70  Pulse: 66  Resp: 18  Temp: (!) 97 F (36.1 C)  TempSrc: Oral  SpO2: 94%  Weight: 162 lb 14.4 oz (73.9 kg)  Height: 5\' 3"  (1.6 m)   Body mass index is 28.86 kg/m. Physical Exam  Constitutional: She is oriented to person, place, and time. She appears well-developed and well-nourished. No distress.  HENT:  Head: Normocephalic and atraumatic.  Nose: Nose normal.  Mouth/Throat: Oropharynx is clear and moist. No oropharyngeal exudate.  Eyes: Conjunctivae and EOM are normal. Pupils are equal, round, and reactive to light. Right eye exhibits no discharge. Left eye exhibits no discharge.  Neck: Normal range of motion. Neck supple.  Cardiovascular: Normal rate and regular rhythm.  Pulmonary/Chest: Effort normal. No respiratory distress. She has no wheezes. She has rales. She exhibits no tenderness.  Poor air movement  Abdominal: Soft. Bowel sounds are normal. She exhibits no distension. There is no tenderness. There is no rebound and no guarding.  Musculoskeletal: She exhibits edema.  Able to move all 4 extremities  Lymphadenopathy:    She has no cervical adenopathy.  Neurological: She is alert and oriented to person, place, and time. She exhibits normal muscle tone.  Skin: Skin is warm and dry. She is not diaphoretic.  Ecchymosis to both hands  Psychiatric: She has a normal mood and  affect.    Labs reviewed: Basic Metabolic Panel: Recent Labs    08/13/17 0852 08/14/17 0449  08/27/17 0636 08/28/17 0746 08/29/17 0629  NA  --   --    < >  138 137 139  K  --   --    < > 4.1 4.0 3.9  CL  --   --    < > 104 103 102  CO2  --   --    < > 22 23 24   GLUCOSE  --   --    < > 151* 135* 129*  BUN  --   --    < > 25* 24* 20  CREATININE  --   --    < > 1.35* 1.36* 1.32*  CALCIUM  --   --    < > 8.3* 8.0* 8.3*  MG 1.7 1.7  --   --   --   --    < > = values in this interval not displayed.   Liver Function Tests: Recent Labs    08/13/17 0405 08/14/17 0449 08/15/17 0402 08/19/17 1529  AST 341* 137* 79* 27  ALT 386* 263* 196* 78*  ALKPHOS 169* 183* 188*  --   BILITOT 1.3* 0.5 0.7 0.5  PROT 5.6* 5.5* 5.4* 5.8*  ALBUMIN 3.1* 3.0* 2.9*  --    Recent Labs    08/12/17 0605  LIPASE 33   No results for input(s): AMMONIA in the last 8760 hours. CBC: Recent Labs    08/12/17 0605  08/25/17 1751 08/26/17 0242 08/27/17 0636  WBC 9.5   < > 11.1* 9.5 8.7  NEUTROABS 9.2*  --  8.7*  --   --   HGB 12.1   < > 11.9* 10.5* 11.3*  HCT 36.5   < > 37.6 34.2* 35.9*  MCV 90.1   < > 92.8 92.2 92.3  PLT 161   < > 428* 379 364   < > = values in this interval not displayed.   Cardiac Enzymes: Recent Labs    08/25/17 2330 08/26/17 0242 08/26/17 0934  TROPONINI 6.39* 7.30* 4.21*   BNP: Invalid input(s): POCBNP No results found for: HGBA1C Lab Results  Component Value Date   TSH 3.546 08/25/2017   Lab Results  Component Value Date   VITAMINB12 272 08/14/2017   Lab Results  Component Value Date   FOLATE 5.8 (L) 08/14/2017   Lab Results  Component Value Date   IRON 16 (L) 08/14/2017   TIBC 266 08/14/2017   FERRITIN 101 08/14/2017    Imaging and Procedures obtained prior to SNF admission: Dg Chest 2 View  Result Date: 08/25/2017 CLINICAL DATA:  Atrial fibrillation. Shortness of breath beginning today. EXAM: CHEST  2 VIEW COMPARISON:  08/12/2017 FINDINGS: Heart  size upper limits of normal. Chronic aortic atherosclerosis. Pulmonary venous hypertension with early interstitial edema. No consolidation or collapse. Tiny amount of pleural fluid in the posterior costophrenic angles. IMPRESSION: Pulmonary venous hypertension with early interstitial edema. Electronically Signed   By: Nelson Chimes M.D.   On: 08/25/2017 20:11   Ct Angio Chest Pe W Or Wo Contrast  Result Date: 08/26/2017 CLINICAL DATA:  Tachycardia with elevated D-dimer. Recent cholecystectomy. History of breast carcinoma EXAM: CT ANGIOGRAPHY CHEST WITH CONTRAST TECHNIQUE: Multidetector CT imaging of the chest was performed using the standard protocol during bolus administration of intravenous contrast. Multiplanar CT image reconstructions and MIPs were obtained to evaluate the vascular anatomy. CONTRAST:  52mL ISOVUE-370 IOPAMIDOL (ISOVUE-370) INJECTION 76% COMPARISON:  Chest radiograph August 25, 2017 FINDINGS: Cardiovascular: There is no demonstrable pulmonary embolus. There is no thoracic aortic aneurysm. No dissection is evident. It must be cautioned that the contrast bolus is not targeted toward  the aorta, in the contrast bolus in the aorta is not felt to be sufficient to exclude possible dissection from an imaging standpoint. There is aortic atherosclerosis. There is calcification in the proximal visualized great vessels as well as foci of coronary artery calcification evident. There is a small pericardial effusion. Pericardium does not appear appreciably thickened. There is mitral annular calcification. Mediastinum/Nodes: Visualized thyroid appears unremarkable. There is a lymph node to the right of the distal trachea anteriorly measuring 1.7 x 1.5 cm. There is a lymph node to the left of the carina measuring 1.8 x 1.2 cm. There are scattered subcentimeter mediastinal lymph nodes as well. No esophageal lesions are evident. Lungs/Pleura: There are moderate pleural effusions bilaterally. There is patchy  airspace consolidation in posterior lung bases, largely due to compressive atelectasis. A degree of superimposed pneumonia in the bases cannot be excluded, however. There is a degree of interstitial edema in the bases as well. There is an azygos lobe on the right, an anatomic variant. Upper Abdomen: In the visualized upper abdomen, there is aortic atherosclerosis. 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 Visualized upper abdominal structures otherwise appear unremarkable. Musculoskeletal: There is degenerative change in the thoracic spine with diffuse idiopathic skeletal hyperostosis. There is anterior wedging of the L2 vertebral body, incompletely visualized. There are no blastic or lytic bone lesions. Review of the MIP images confirms the above findings. IMPRESSION: 1.  No demonstrable pulmonary embolus. 2. No thoracic aortic aneurysm. There is extensive aortic and great vessel calcification as well as foci of coronary artery calcification. No dissection evident; note that the contrast bolus is not sufficient to exclude this entity from an imaging standpoint. 3. Pleural effusions bilaterally with compressive atelectasis in both lung bases. There may be superimposed pneumonia in the lung bases. There is bibasilar interstitial edema. There is felt to be a degree of underlying congestive heart failure. 4. Mildly enlarged pericarinal region lymph nodes. Suspect reactive etiology given the parenchymal lung changes. 5.  Small pericardial effusion. 6. 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 Aortic Atherosclerosis (ICD10-I70.0). Electronically Signed   By: Lowella Grip III M.D.   On: 08/26/2017 17:21    Assessment/Plan  1. Physical deconditioning Will have her work with physical therapy and occupational therapy team to help with gait training and muscle strengthening  exercises.fall precautions. Skin care. Encourage to be out of bed.   2. Paroxysmal atrial fibrillation (HCC) Controlled HR, asymptomatic. Continue metoprolol tartrate 50 mg bid for rate control. Monitor HR. Continue eliquis for stroke prophylaxis. Monitor cbc. Needs cardiology follow up.   3. NSTEMI (non-ST elevated myocardial infarction) (HCC) Continue metoprolol tartrate with atorvastatin and plavix 75 mg daily. F/u with cardiology in 2 weeks.   4. Diastolic congestive heart failure, unspecified HF chronicity (HCC) Continue b blocker. Continue furosemide 20 mg daily for now and monitor daily weight for 2 weeks, then 3 days a week.   5. Essential hypertension Stable BP on review. Continue metoprolol tartrate 50 mg bid with lasix 20 mg daily for now. Monitor BP/HR.   6. Allergic rhinitis, unspecified seasonality, unspecified trigger Currently on robitussin dm 5 ml q4h prn and claritin 10 mg daily. Continue with flonase. D/c robitussin dm and start robitussin 10 ml tid for now. Add incentive spirometer and duoneb tid x 1 week. Reassess.   7. Gastroesophageal reflux disease, esophagitis presence not specified Controlled. Continue pantoprazole 40  mg dail.   8. Goals of care, counseling/discussion Reviewed goals of care with patient. She is a full code at present. She would like to be DNR in absence of pulse or breathing. She has a living will but she is in process of having a new one drafted. She also needs a HCPOA paperwork filled out. She is not interested in filling out a MOST form at present. She mentions that she would like to be sent to the hospital in need of management but would like limited interventions. She does not want to be intubated. She would like antibiotic and fluids if indicated. She wants to review this further with her husband and decided. DNR form filled out and signed, original to be placed in chart, copy to be scanned in [place]. Spent 18 minutes in this discussion from 3  pm-3:18 pm. Social worker updated about this plan. Social worker to set up a time to review goals of care further with pt and her husband.   9. Acute renal failure superimposed on stage 3 chronic kidney disease, unspecified acute renal failure type ([place]) With her on furosemide, monitor BMP.   Family/ staff Communication: reviewed care plan with patient    Labs/tests ordered: cbc, cmp in 1 week  Blanchie Serve, MD Internal Medicine [place], [place] [address] Cell Phone (Monday-Friday 8 am - 5 pm): [phone] On Call: [phone] and follow prompts after 5 pm and on weekends Office Phone: [phone] Office Fax: [phone]

## 2017-08-31 NOTE — Telephone Encounter (Signed)
I left a voice message for pt to return my call.  

## 2017-09-01 ENCOUNTER — Inpatient Hospital Stay: Payer: MEDICARE | Admitting: Internal Medicine

## 2017-09-01 NOTE — Telephone Encounter (Signed)
I spoke with spouse Herbie [place], patient is currently at [facility], asked that we cancel appointment for today with Dr. Raliegh Ip, which I did.

## 2017-09-02 ENCOUNTER — Telehealth: Payer: Self-pay | Admitting: Cardiovascular Disease

## 2017-09-02 NOTE — Telephone Encounter (Signed)
Called patient and spoke with husband and patient is unavailable. She is in [place] center. Husband is aware that his wife has an appointment scheduled on Feb 21

## 2017-09-02 NOTE — Telephone Encounter (Signed)
TOC Phone Call- Appt is 09/08/17 at 9:30am w/ Jory Sims

## 2017-09-05 LAB — CBC
HCT: 29.4
HGB: 10.2
WBC: 7.8
platelet count: 199

## 2017-09-05 LAB — COMPLETE METABOLIC PANEL WITH GFR
ALBUMIN: 2.8
ALT: 87
AST: 63
Alkaline Phosphatase: 114
BILIRUBIN TOTAL: 0.7
BUN: 16 (ref 4–21)
CALCIUM: 7.5
Creat: 1.01
GLUCOSE: 125
Potassium: 3.7
SODIUM: 138
TOTAL PROTEIN: 5.3 g/dL

## 2017-09-05 NOTE — Progress Notes (Signed)
Cardiology Office Note   Date:  09/08/2017   ID:  Marks, Taylor 10-15-1921, MRN [id number]  PCP:  Taylor Lor, MD  Cardiologist:  Dr.Croitoru  Chief Complaint  Patient presents with  . Hospitalization Follow-up  . Fatigue  . Atrial Fibrillation    PAF     History of Present Illness: [name] is a 84 y.o. female who presents for consultation follow-up, after being seen on consultation for new onset atrial fibrillation and non-STEMI. This occurred during a session of physical therapy after having had cholecystectomy prior to event. The patient converted to NSR,  but atrial fibrillation recurred. The patient was unaware of irregular heart rhythm and denied any complaints of shortness of breath dizziness or chest pain.  During hospitalization the patient did have an echocardiogram which revealed normal systolic function, EF of 01% to 09%, grade 2 diastolic dysfunction, severely calcified mitral valve annulus, moderate regurg, mildly calcified aortic valve. With moderate TR.  In the setting of an NSTEMI, the patient was continued IV heparin for 48 hours to 72 hours, and then started on oral anticoagulation and a course of clopidogrel or 12 months. ASA was discontinued due to increased risk of GI bleeding.   She was continued on beta blocker, statin therapy.It was believed  that she did have myocardial event in the setting of shortness of  breath, along with low diastolic blood pressure and chronic kidney disease. She is recommended to avoid  RAAS. Repeat lipid profile in 3 months. He was discharged to [facility]  She is here today with complaints of fatigue, lack of appetite, and some bleeding when she blows her nose. She has not started in PT yet. She is medically compliant.    Past Medical History:  Diagnosis Date  . Allergic rhinitis, seasonal   . Breast cancer of upper-outer quadrant of right female breast ([place]) 09/13/2014  . Chronic venous  insufficiency   . Dermatophytosis of nail   . Edema leg   . Family history of malignant neoplasm of breast   . Family history of malignant neoplasm of ovary   . Goiter    right thyroid nodule   . HTN (hypertension) 08/13/2017  . HX: breast cancer    bilateral  . Menopausal syndrome   . Osteoarthritis   . Osteoporosis   . Squamous cell skin cancer    right lower leg  . Wears glasses   . Wears hearing aid    both ears    Past Surgical History:  Procedure Laterality Date  . bilateral lumpectomies for breast cancer  Aug. 2007  . BREAST LUMPECTOMY WITH RADIOACTIVE SEED LOCALIZATION Right 10/01/2014   Procedure: BREAST LUMPECTOMY WITH RADIOACTIVE SEED LOCALIZATION;  Surgeon: Erroll Luna, MD;  Location: [facility];  Service: General;  Laterality: Right;  . CATARACT EXTRACTION    . CHOLECYSTECTOMY N/A 08/13/2017   Procedure: LAPAROSCOPIC CHOLECYSTECTOMY WITH INTRAOPERATIVE CHOLANGIOGRAM;  Surgeon: Excell Seltzer, MD;  Location: [facility];  Service: General;  Laterality: N/A;  . DILATION AND CURETTAGE OF UTERUS    . ESOPHAGOGASTRODUODENOSCOPY  07/26/2011   Procedure: ESOPHAGOGASTRODUODENOSCOPY (EGD);  Surgeon: Jeryl Columbia, MD;  Location: [facility];  Service: Endoscopy;  Laterality: N/A;  . history of lumbar compression fracture    . left eardum sx  1983  . left knee arthroscopic surgery    . no screening colonoscopy    . s/p bilateral lumpectomies    . SAVORY DILATION  07/26/2011  Procedure: SAVORY DILATION;  Surgeon: Jeryl Columbia, MD;  Location: [facility];  Service: Endoscopy;  Laterality: N/A;  . status post resection squamous cell cancer right lower leg    . TONSILLECTOMY    . UMBILICAL HERNIA REPAIR       Current Outpatient Medications  Medication Sig Dispense Refill  . acetaminophen (TYLENOL) 325 MG tablet Take 650 mg by mouth every 4 (four) hours as needed for mild pain or headache.    Marland Kitchen apixaban (ELIQUIS) 5 MG TABS tablet Take 1 tablet (5 mg total) by  mouth 2 (two) times daily. 60 tablet 0  . atorvastatin (LIPITOR) 40 MG tablet Take 40 mg by mouth daily.    . Calcium Carb-Cholecalciferol (CALCIUM 600 + D PO) Take 1 tablet by mouth daily.     . clopidogrel (PLAVIX) 75 MG tablet Take 1 tablet (75 mg total) by mouth daily. 30 tablet 0  . fluticasone (FLONASE) 50 MCG/ACT nasal spray Place 2 sprays into both nostrils daily. 16 g 6  . furosemide (LASIX) 40 MG tablet Take 0.5 tablets (20 mg total) by mouth daily. 90 tablet 3  . guaifenesin (ROBITUSSIN) 100 MG/5ML syrup Take 10 mLs by mouth 3 (three) times daily as needed for cough.    Marland Kitchen ketoconazole (NIZORAL) 2 % shampoo Apply 1 application topically every 7 (seven) days.     Marland Kitchen loperamide (IMODIUM A-D) 2 MG tablet Take 2 mg by mouth as needed for diarrhea or loose stools.    Marland Kitchen loratadine (CLARITIN) 10 MG tablet Take 10 mg by mouth daily.    . metoprolol tartrate (LOPRESSOR) 50 MG tablet Take 50 mg by mouth 2 (two) times daily.    . Multiple Vitamins-Minerals (PRESERVISION AREDS 2) CAPS Take 2 capsules by mouth 2 (two) times daily.     . pantoprazole (PROTONIX) 40 MG tablet Take 40 mg by mouth daily.      No current facility-administered medications for this visit.     Allergies:   Celecoxib    Social History:  The patient  reports that  has never smoked. she has never used smokeless tobacco. She reports that she does not drink alcohol or use drugs.   Family History:  The patient's family history includes Cancer in her other; Cancer (age of onset: 23) in her maternal aunt; Cancer (age of onset: 42) in her maternal aunt; Cancer (age of onset: 23) in her mother; Coronary artery disease in her other; Heart attack in her brother, brother, and father; Heart disease in her brother.    ROS: All other systems are reviewed and negative. Unless otherwise mentioned in H&P    PHYSICAL EXAM: VS:  BP 118/60   Pulse 82   Ht 5\' 3"  (1.6 m)   Wt 160 lb 3.2 oz (72.7 kg)   BMI 28.38 kg/m  , BMI Body mass  index is 28.38 kg/m. GEN: Well nourished, well developed, in no acute distress  HEENT: normal  Neck: no JVD, carotid bruits, or masses Cardiac: RRR; 2/6 systolic murmurs, rubs, or gallops,no edema  Respiratory:  Clear to auscultation bilaterally, normal work of breathing GI: soft, nontender, nondistended, + BS MS: no deformity or atrophy  Skin: warm and dry, no rash. Pale  Neuro:  Strength and sensation are intact Psych: euthymic mood, full affect   EKG:  NSR with left axis deviation. LVH. Rate of 82 bpm.  Recent Labs: 08/14/2017: Magnesium 1.7 08/25/2017: TSH 3.546 08/27/2017: Platelets 364 09/05/2017: ALT 87; BUN 16; Creat 1.01; HGB  10.2; Potassium 3.7; Sodium 138    Lipid Panel    Component Value Date/Time   CHOL 245 (H) 06/25/2013 0948   TRIG 83.0 06/25/2013 0948   HDL 58.90 06/25/2013 0948   CHOLHDL 4 06/25/2013 0948   VLDL 16.6 06/25/2013 0948   LDLCALC 103 (H) 04/10/2009 1021   LDLDIRECT 173.9 06/25/2013 0948      Wt Readings from Last 3 Encounters:  09/08/17 160 lb 3.2 oz (72.7 kg)  09/06/17 160 lb (72.6 kg)  08/31/17 162 lb 14.4 oz (73.9 kg)    Other studies Reviewed: Left ventricle: The cavity size was normal. Wall thickness was   increased in a pattern of mild LVH. Systolic function was normal.   The estimated ejection fraction was in the range of 50% to 55%.   There is akinesis of the apical myocardium. Features are   consistent with a pseudonormal left ventricular filling pattern,   with concomitant abnormal relaxation and increased filling   pressure (grade 2 diastolic dysfunction). - Aortic valve: Trileaflet; mildly thickened, mildly calcified   leaflets. There was mild regurgitation. - Mitral valve: Severely calcified annulus. Mildly thickened,   mildly calcified leaflets . There was moderate regurgitation. - Left atrium: The atrium was mildly to moderately dilated.   Volume/bsa, S: 43.1 ml/m^2. - Tricuspid valve: There was moderate regurgitation. -  Pulmonary arteries: Systolic pressure was moderately increased.  ASSESSMENT AND PLAN:  1.  PAF: Currently in NSR. Continue Eliquis for stroke prophylaxis CHADS VASC of 4. She is rate controlled on metoprolol. For bleeding while blowing her nose, she is given recommendation for A[place], nasal gel, to place into nares TID. This is written on SNF/Rehab paperwork. Due to fatigue, likely from deconditioning, but may be related to metoprolol as well, I will change her metoprolol tartrate 50 mg BID to long acting metoprolol succinate 50 mg @ HS. This may also help with her appetite as well.   2. Diastolic Dysfunction: She has Grade II diastolic dysfunction. She does not appear to be decompensated. Continue HR control and BP control management. She is continuing lasix and has lost 11 lbs since last hospitalized. I have reviewed recent labs from 09/05/2017 she was not found to be anemic or dehydrated at that time.   3. Severe Mitral Valve Calcification: Also with moderate MVR. She has some dyspnea.  Will continue medical management. Defer to Dr. Sallyanne Kuster for his recommendations for her to be seen at valvular heart clinic. Will require TEE first.    4. Lack of appetite: Worrisome for nutritional deficiency. No evidence for Failure to Thrive at this time. I have requested that she begin Ensure or Boost supplements for help for her nutritional needs. Will need to discuss further with PCP if need to add Megace. Will not do this now and defer to PCP for recommendation.    Current medicines are reviewed at length with the patient today.    Labs/ tests ordered today include: None   Phill Myron. West Pugh, ANP, AACC   09/08/2017 11:52 AM    [facility] [address] [place], [place] [address] Phone: [phone]; Fax: [phone]

## 2017-09-06 ENCOUNTER — Non-Acute Institutional Stay (SKILLED_NURSING_FACILITY): Payer: MEDICARE | Admitting: Family

## 2017-09-06 ENCOUNTER — Encounter: Payer: Self-pay | Admitting: Family

## 2017-09-06 DIAGNOSIS — E782 Mixed hyperlipidemia: Secondary | ICD-10-CM

## 2017-09-06 DIAGNOSIS — R748 Abnormal levels of other serum enzymes: Secondary | ICD-10-CM

## 2017-09-06 DIAGNOSIS — E8809 Other disorders of plasma-protein metabolism, not elsewhere classified: Secondary | ICD-10-CM | POA: Diagnosis not present

## 2017-09-06 DIAGNOSIS — E441 Mild protein-calorie malnutrition: Secondary | ICD-10-CM | POA: Diagnosis not present

## 2017-09-06 NOTE — Progress Notes (Signed)
Location:  [facility] Room Number: 30 Place of Service:  SNF [phone]) Provider: Marice Guidone FNP-C  Marletta Lor, MD  Patient Care Team: Marletta Lor, MD as PCP - Rosana Hoes, MD as Consulting Physician (General Surgery) Magrinat, Virgie Dad, MD as Consulting Physician (Oncology) Arloa Koh, MD as Consulting Physician (Radiation Oncology) [name], RN as Registered Nurse [name], RN as Registered Nurse [name], NP as Nurse Practitioner (Nurse Practitioner) [name], MD as Consulting Physician (Gastroenterology)  Extended Emergency Contact Information Primary Emergency Contact: Oneal Grout Address: [facility]          [address]          [place], [place] [address] [place] Phone: [phone] Relation: Spouse  Code Status:  DNR Goals of care: Advanced Directive information Advanced Directives 09/06/2017  Does Patient Have a Medical Advance Directive? Yes  Type of Advance Directive Out of facility DNR (pink MOST or yellow form)  Does patient want to make changes to medical advance directive? -  Copy of [place] in Chart? -  Pre-existing out of facility DNR order (yellow form or pink MOST form) Yellow form placed in chart (order not valid for inpatient use)     Chief Complaint  Patient presents with  . Acute Visit    abnormal labs    HPI:  Pt is a 84 y.o. female seen today at [facility] for an acute visit for evaluation of abnormal lab results.she is seen in her room today.she denies any acute issues during visit.Her recent lab results showed BUN 16,CR 1.01,TP 5.3,ALB 2.8,AST 63,ALT 87, Hgb10.2,Ca 7.5 ( 09/05/2017).    Past Medical History:  Diagnosis Date  . Allergic rhinitis, seasonal   . Breast cancer of upper-outer quadrant of right female breast ([place] [facility]) 09/13/2014  . Chronic venous insufficiency   .  Dermatophytosis of nail   . Edema leg   . Family history of malignant neoplasm of breast   . Family history of malignant neoplasm of ovary   . Goiter    right thyroid nodule   . HTN (hypertension) 08/13/2017  . HX: breast cancer    bilateral  . Menopausal syndrome   . Osteoarthritis   . Osteoporosis   . Squamous cell skin cancer    right lower leg  . Wears glasses   . Wears hearing aid    both ears   Past Surgical History:  Procedure Laterality Date  . bilateral lumpectomies for breast cancer  Aug. 2007  . BREAST LUMPECTOMY WITH RADIOACTIVE SEED LOCALIZATION Right 10/01/2014   Procedure: BREAST LUMPECTOMY WITH RADIOACTIVE SEED LOCALIZATION;  Surgeon: Erroll Luna, MD;  Location: [facility];  Service: General;  Laterality: Right;  . CATARACT EXTRACTION    . CHOLECYSTECTOMY N/A 08/13/2017   Procedure: LAPAROSCOPIC CHOLECYSTECTOMY WITH INTRAOPERATIVE CHOLANGIOGRAM;  Surgeon: Excell Seltzer, MD;  Location: [facility];  Service: General;  Laterality: N/A;  . DILATION AND CURETTAGE OF UTERUS    . ESOPHAGOGASTRODUODENOSCOPY  07/26/2011   Procedure: ESOPHAGOGASTRODUODENOSCOPY (EGD);  Surgeon: Jeryl Columbia, MD;  Location: [facility];  Service: Endoscopy;  Laterality: N/A;  . history of lumbar compression fracture    . left eardum sx  1983  . left knee arthroscopic surgery    . no screening colonoscopy    . s/p bilateral lumpectomies    . SAVORY  DILATION  07/26/2011   Procedure: SAVORY DILATION;  Surgeon: Jeryl Columbia, MD;  Location: [facility];  Service: Endoscopy;  Laterality: N/A;  . status post resection squamous cell cancer right lower leg    . TONSILLECTOMY    . UMBILICAL HERNIA REPAIR      Allergies  Allergen Reactions  . Celecoxib Swelling    REACTION: Swelling-leg    Outpatient Encounter Medications as of 09/06/2017  Medication Sig  . apixaban (ELIQUIS) 5 MG TABS tablet Take 1 tablet (5 mg total) by mouth 2 (two) times daily.  Marland Kitchen atorvastatin (LIPITOR) 80  MG tablet Take 1 tablet (80 mg total) by mouth daily at 6 PM.  . Calcium Carb-Cholecalciferol (CALCIUM 600 + D PO) Take 1 tablet by mouth daily.   . clopidogrel (PLAVIX) 75 MG tablet Take 1 tablet (75 mg total) by mouth daily.  . fluticasone (FLONASE) 50 MCG/ACT nasal spray Place 2 sprays into both nostrils daily.  . furosemide (LASIX) 40 MG tablet Take 0.5 tablets (20 mg total) by mouth daily.  Marland Kitchen guaifenesin (ROBITUSSIN) 100 MG/5ML syrup Take 10 mLs by mouth 3 (three) times daily as needed for cough.  Marland Kitchen ketoconazole (NIZORAL) 2 % shampoo Apply 1 application topically every 7 (seven) days.   Marland Kitchen loperamide (IMODIUM A-D) 2 MG tablet Take 2 mg by mouth as needed for diarrhea or loose stools.  Marland Kitchen loratadine (CLARITIN) 10 MG tablet Take 10 mg by mouth daily.  . metoprolol tartrate (LOPRESSOR) 50 MG tablet Take 50 mg by mouth 2 (two) times daily.  . Multiple Vitamins-Minerals (PRESERVISION AREDS 2) CAPS Take 2 capsules by mouth 2 (two) times daily.   . pantoprazole (PROTONIX) 40 MG tablet Take 40 mg by mouth daily.   Marland Kitchen acetaminophen (TYLENOL) 325 MG tablet Take 650 mg by mouth every 4 (four) hours as needed for mild pain or headache.  . [DISCONTINUED] guaiFENesin-dextromethorphan (ROBITUSSIN DM) 100-10 MG/5ML syrup Take 5 mLs by mouth every 4 (four) hours as needed for cough (chest congestion).   No facility-administered encounter medications on file as of 09/06/2017.     Review of Systems  Constitutional: Positive for appetite change. Negative for chills, fatigue and fever.  HENT: Positive for hearing loss. Negative for congestion, rhinorrhea, sinus pressure, sinus pain, sneezing and sore throat.   Eyes: Negative for redness.  Respiratory: Negative for cough, chest tightness, shortness of breath and wheezing.        Oxygen via nasal cannula as needed not wearing during the visit   Cardiovascular: Negative for chest pain, palpitations and leg swelling.  Gastrointestinal: Negative for abdominal  distention, abdominal pain, constipation, diarrhea, nausea, rectal pain and vomiting.  Genitourinary: Negative for dysuria, flank pain, hematuria and urgency.  Musculoskeletal: Positive for gait problem.  Skin: Negative for color change, pallor and rash.  Neurological: Negative for dizziness, light-headedness and headaches.  Psychiatric/Behavioral: Negative for agitation, confusion and sleep disturbance. The patient is not nervous/anxious.     Immunization History  Administered Date(s) Administered  . Influenza Split 04/19/2011, 05/01/2012  . Influenza Whole 04/18/2006, 05/02/2007, 04/10/2009, 04/14/2010  . Influenza, High Dose Seasonal PF 05/03/2013, 06/05/2015, 05/16/2017  . Influenza-Unspecified 05/06/2014, 04/29/2016  . PPD Test 02/08/2011  . Pneumococcal Conjugate-13 05/06/2012  . Pneumococcal Polysaccharide-23 04/18/2005, 02/08/2011  . Td 04/18/2005  . Zoster 05/18/2012   Pertinent  Health Maintenance Due  Topic Date Due  . MAMMOGRAM  04/09/2017  . INFLUENZA VACCINE  Completed  . DEXA SCAN  Completed  . PNA vac Low Risk  Adult  Completed   Fall Risk  08/09/2016 08/07/2015 07/25/2014 06/25/2013  Falls in the past year? Yes No No No  Number falls in past yr: 1 - - -  Injury with Fall? No - - -    Vitals:   09/06/17 1351  BP: (!) 128/56  Pulse: 80  Resp: 20  Temp: 98.3 F (36.8 C)  SpO2: 96%  Weight: 160 lb (72.6 kg)  Height: 5\' 3"  (1.6 m)   Body mass index is 28.34 kg/m. Physical Exam  Constitutional: She is oriented to person, place, and time. She appears well-developed. No distress.  Elderly in no acute distress  HENT:  Head: Normocephalic.  Right Ear: External ear normal.  Left Ear: External ear normal.  Mouth/Throat: Oropharynx is clear and moist. No oropharyngeal exudate.  HOH  Eyes: Conjunctivae and EOM are normal. Pupils are equal, round, and reactive to light. Right eye exhibits no discharge. Left eye exhibits no discharge. No scleral icterus.  Neck:  Normal range of motion. No JVD present. No thyromegaly present.  Cardiovascular: Normal rate, normal heart sounds and intact distal pulses. Exam reveals no gallop and no friction rub.  No murmur heard. Pulmonary/Chest: Effort normal and breath sounds normal. No respiratory distress. She has no wheezes. She has no rales.  Abdominal: Soft. Bowel sounds are normal. She exhibits no distension. There is no tenderness. There is no rebound and no guarding.  Musculoskeletal: She exhibits no edema or tenderness.  Moves x 4 extremities.unsteady gait uses FWW  Lymphadenopathy:    She has no cervical adenopathy.  Neurological: She is oriented to person, place, and time. Coordination normal.  Skin: Skin is warm and dry. No rash noted. No erythema.  Psychiatric: She has a normal mood and affect.   Labs reviewed: Recent Labs    08/13/17 0852 08/14/17 0449  08/27/17 0636 08/28/17 0746 08/29/17 0629 09/05/17  NA  --   --    < > 138 137 139 138  K  --   --    < > 4.1 4.0 3.9 3.7  CL  --   --    < > 104 103 102  --   CO2  --   --    < > 22 23 24   --   GLUCOSE  --   --    < > 151* 135* 129*  --   BUN  --   --    < > 25* 24* 20 16  CREATININE  --   --    < > 1.35* 1.36* 1.32* 1.01  CALCIUM  --   --    < > 8.3* 8.0* 8.3* 7.5  MG 1.7 1.7  --   --   --   --   --    < > = values in this interval not displayed.   Recent Labs    08/14/17 0449 08/15/17 0402 08/19/17 1529 09/05/17  AST 137* 79* 27 63  ALT 263* 196* 78* 87  ALKPHOS 183* 188*  --  114  BILITOT 0.5 0.7 0.5 0.7  PROT 5.5* 5.4* 5.8* 5.3  ALBUMIN 3.0* 2.9*  --  2.8   Recent Labs    08/12/17 0605  08/25/17 1751 08/26/17 0242 08/27/17 0636 09/05/17  WBC 9.5   < > 11.1* 9.5 8.7 7.8  NEUTROABS 9.2*  --  8.7*  --   --   --   HGB 12.1   < > 11.9* 10.5* 11.3* 10.2  HCT 36.5   < >  37.6 34.2* 35.9* 29.4  MCV 90.1   < > 92.8 92.2 92.3  --   PLT 161   < > 428* 379 364  --    < > = values in this interval not displayed.   Lab Results    Component Value Date   TSH 3.546 08/25/2017    Lab Results  Component Value Date   CHOL 245 (H) 06/25/2013   HDL 58.90 06/25/2013   LDLCALC 103 (H) 04/10/2009   LDLDIRECT 173.9 06/25/2013   TRIG 83.0 06/25/2013   CHOLHDL 4 06/25/2013    Significant Diagnostic Results in last 30 days:  Dg Chest 2 View  Result Date: 08/25/2017 CLINICAL DATA:  Atrial fibrillation. Shortness of breath beginning today. EXAM: CHEST  2 VIEW COMPARISON:  08/12/2017 FINDINGS: Heart size upper limits of normal. Chronic aortic atherosclerosis. Pulmonary venous hypertension with early interstitial edema. No consolidation or collapse. Tiny amount of pleural fluid in the posterior costophrenic angles. IMPRESSION: Pulmonary venous hypertension with early interstitial edema. Electronically Signed   By: Nelson Chimes M.D.   On: 08/25/2017 20:11   Dg Chest 2 View  Result Date: 08/12/2017 CLINICAL DATA:  History of breast carcinoma. Nausea and vomiting. Fever. EXAM: CHEST  2 VIEW COMPARISON:  Nov 28, 2015 FINDINGS: There is no edema or consolidation. Heart is mildly enlarged with pulmonary vascularity within normal limits. There is aortic atherosclerosis. No adenopathy. No blastic or lytic bone lesions. There is degenerative change in the thoracic spine. IMPRESSION: No edema or consolidation. Heart mildly enlarged. There is aortic atherosclerosis. Aortic Atherosclerosis (ICD10-I70.0). Electronically Signed   By: Lowella Grip III M.D.   On: 08/12/2017 08:40   Dg Cholangiogram Operative  Result Date: 08/13/2017 CLINICAL DATA:  84 year old female with a history of cholecystitis EXAM: INTRAOPERATIVE CHOLANGIOGRAM TECHNIQUE: Cholangiographic images from the C-arm fluoroscopic device were submitted for interpretation post-operatively. Please see the procedural report for the amount of contrast and the fluoroscopy time utilized. COMPARISON:  Ultrasound 08/12/2017 FINDINGS: Surgical instruments project over the upper abdomen.  There is cannulation of the cystic duct/gallbladder neck, with antegrade infusion of contrast. Caliber of the extrahepatic ductal system borderline enlarged, though not unusual for a patient of this age. No definite filling defect within the extrahepatic ducts identified. Free flow of contrast across the ampulla. IMPRESSION: Intraoperative cholangiogram demonstrates mild dilation of the extrahepatic biliary ducts, with no definite filling defects identified. Free flow of contrast across the ampulla. Please refer to the dictated operative report for full details of intraoperative findings and procedure Electronically Signed   By: Corrie Mckusick D.O.   On: 08/13/2017 15:07   Ct Angio Chest Pe W Or Wo Contrast  Result Date: 08/26/2017 CLINICAL DATA:  Tachycardia with elevated D-dimer. Recent cholecystectomy. History of breast carcinoma EXAM: CT ANGIOGRAPHY CHEST WITH CONTRAST TECHNIQUE: Multidetector CT imaging of the chest was performed using the standard protocol during bolus administration of intravenous contrast. Multiplanar CT image reconstructions and MIPs were obtained to evaluate the vascular anatomy. CONTRAST:  57mL ISOVUE-370 IOPAMIDOL (ISOVUE-370) INJECTION 76% COMPARISON:  Chest radiograph August 25, 2017 FINDINGS: Cardiovascular: There is no demonstrable pulmonary embolus. There is no thoracic aortic aneurysm. No dissection is evident. It must be cautioned that the contrast bolus is not targeted toward the aorta, in the contrast bolus in the aorta is not felt to be sufficient to exclude possible dissection from an imaging standpoint. There is aortic atherosclerosis. There is calcification in the proximal visualized great vessels as well as foci of coronary artery  calcification evident. There is a small pericardial effusion. Pericardium does not appear appreciably thickened. There is mitral annular calcification. Mediastinum/Nodes: Visualized thyroid appears unremarkable. There is a lymph node to the  right of the distal trachea anteriorly measuring 1.7 x 1.5 cm. There is a lymph node to the left of the carina measuring 1.8 x 1.2 cm. There are scattered subcentimeter mediastinal lymph nodes as well. No esophageal lesions are evident. Lungs/Pleura: There are moderate pleural effusions bilaterally. There is patchy airspace consolidation in posterior lung bases, largely due to compressive atelectasis. A degree of superimposed pneumonia in the bases cannot be excluded, however. There is a degree of interstitial edema in the bases as well. There is an azygos lobe on the right, an anatomic variant. Upper Abdomen: In the visualized upper abdomen, there is aortic atherosclerosis. 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 Visualized upper abdominal structures otherwise appear unremarkable. Musculoskeletal: There is degenerative change in the thoracic spine with diffuse idiopathic skeletal hyperostosis. There is anterior wedging of the L2 vertebral body, incompletely visualized. There are no blastic or lytic bone lesions. Review of the MIP images confirms the above findings. IMPRESSION: 1.  No demonstrable pulmonary embolus. 2. No thoracic aortic aneurysm. There is extensive aortic and great vessel calcification as well as foci of coronary artery calcification. No dissection evident; note that the contrast bolus is not sufficient to exclude this entity from an imaging standpoint. 3. Pleural effusions bilaterally with compressive atelectasis in both lung bases. There may be superimposed pneumonia in the lung bases. There is bibasilar interstitial edema. There is felt to be a degree of underlying congestive heart failure. 4. Mildly enlarged pericarinal region lymph nodes. Suspect reactive etiology given the parenchymal lung changes. 5.  Small pericardial effusion. 6. Approximately 1 cm right renal artery aneurysm with peripheral calcification, a finding of doubtful clinical significance in this  age group. Aortic Atherosclerosis (ICD10-I70.0). Electronically Signed   By: Lowella Grip III M.D.   On: 08/26/2017 17:21   Dg Chest Port 1 View  Result Date: 08/29/2017 CLINICAL DATA:  84 year old female with shortness breath and cough. Breast cancer post lumpectomies 6 years ago. Subsequent encounter. EXAM: PORTABLE CHEST 1 VIEW COMPARISON:  08/26/2017 chest CT. 08/25/2017 and 08/12/2017 chest x-ray. FINDINGS: Interval slight decrease in degree of pulmonary vascular congestion. CT detected pleural effusions and basal atelectasis not as well delineated on the present plain film exam. Cardiomegaly. Calcified aorta. Curvature thoracic spine. IMPRESSION: Interval slight decrease in degree of pulmonary vascular congestion. CT noted pleural effusions and basilar atelectasis not as well delineated on present plain film exam. Cardiomegaly. Aortic Atherosclerosis (ICD10-I70.0). Electronically Signed   By: Genia Del M.D.   On: 08/29/2017 09:57   US Abdomen Limited Ruq  Result Date: 08/12/2017 CLINICAL DATA:  Elevated liver enzymes with nausea and vomiting EXAM: ULTRASOUND ABDOMEN LIMITED RIGHT UPPER QUADRANT COMPARISON:  None. FINDINGS: Gallbladder: Within the gallbladder, there are echogenic foci which move and shadow consistent with cholelithiasis. Largest individual gallstone measures 9 mm in length. Gallbladder wall is mildly thickened with subtle edema. No pericholecystic fluid. No sonographic Murphy sign noted by sonographer. Common bile duct: Diameter: 8 mm, mildly prominent. No biliary duct mass or calculus evident. Liver: No focal lesion identified. Within normal limits in parenchymal echogenicity. Portal vein is patent on color Doppler imaging with normal direction of blood flow towards the liver. IMPRESSION: Cholelithiasis with mild gallbladder wall thickening. Gallbladder wall appears subtly edematous. These are findings concerning for  early acute cholecystitis. It may be prudent in this regard  to consider nuclear medicine hepatobiliary imaging study to assess for cystic duct patency. Mild prominence of the common bile duct without mass or calculus evident. Electronically Signed   By: Lowella Grip III M.D.   On: 08/12/2017 08:13    Assessment/Plan 1. Elevated liver enzymes AST 63,ALT 87 ( 09/05/2017). Trending down.Currently on Atorvastatin 80 mg Tablet daily.given her advance age and elevated liver enzymes will decrease atorvastatin to 40 mg tablet daily at 6 PM. Recheck CMP in 2 weeks.   2. Mild protein-calorie malnutrition TP 5.3 ( 09/05/2017).Reports poor appetite.Facility Registered Dietician to evaluate for protein supplement.continue to monitor CMP    3. Hypoalbuminemia ALB 2.8 ( 09/05/2017). Possible due to her poor appetite.RD for protein supplements.continue to monitor.   4. Hyperlipidemia  Elevated liver enzymes.Decrease atorvastatin to 40 mg tablet daily at 6 PM.Check lipid panel in 2 weeks.   5. Anemia   Hgb10.2 ( 09/05/2017). Post cholecystectomy.No signs of bleeding.continue to monitor.Recheck CBC in 2 weeks.    Family/ staff Communication: Reviewed plan of care with patient and facility Nurse.  Labs/tests ordered: CBC,CMP and fasting lipid panel in 2 weeks.   Sandrea Hughs, NP

## 2017-09-08 ENCOUNTER — Telehealth: Payer: Self-pay | Admitting: Podiatry

## 2017-09-08 ENCOUNTER — Telehealth: Payer: Self-pay | Admitting: Adult Health

## 2017-09-08 ENCOUNTER — Ambulatory Visit (INDEPENDENT_AMBULATORY_CARE_PROVIDER_SITE_OTHER): Payer: MEDICARE | Admitting: Adult Health

## 2017-09-08 ENCOUNTER — Encounter: Payer: Self-pay | Admitting: Adult Health

## 2017-09-08 VITALS — BP 118/60 | HR 82 | Ht 63.0 in | Wt 160.2 lb

## 2017-09-08 DIAGNOSIS — E7800 Pure hypercholesterolemia, unspecified: Secondary | ICD-10-CM

## 2017-09-08 DIAGNOSIS — I5032 Chronic diastolic (congestive) heart failure: Secondary | ICD-10-CM

## 2017-09-08 DIAGNOSIS — R55 Syncope and collapse: Secondary | ICD-10-CM

## 2017-09-08 DIAGNOSIS — I4891 Unspecified atrial fibrillation: Secondary | ICD-10-CM | POA: Diagnosis not present

## 2017-09-08 DIAGNOSIS — I248 Other forms of acute ischemic heart disease: Secondary | ICD-10-CM

## 2017-09-08 NOTE — Telephone Encounter (Signed)
Alyse Low, can you please schedule this patient, she can be put on the nurse schedule either Tuesday or Thursday. Thank you

## 2017-09-08 NOTE — Patient Instructions (Signed)
Medication Instructions:  DECREASE METOPROLOL 50MG  AT BEDTIME  START APIXABAN(ELIQUIS) 5MG  TWICE DAILY    If you need a refill on your cardiac medications before your next appointment, please call your pharmacy.  Special Instructions: START DRINKING ENSURE TWICE DAILY   OK TO START PHYSICAL THERAPY IN FACILITY  START USING AYR NASAL GEL  Follow-Up: Your physician wants you to keep follow-up in: May 2019   Thank you for choosing [facility] at [employer]!!

## 2017-09-08 NOTE — Telephone Encounter (Signed)
New message   [facility] is calling to request prescriptions  be  faxed to [phone] for medications.  Pt c/o medication issue:  1. Name of Medication: Ayr nasal gel and  metoprolol tartrate (LOPRESSOR) 50 MG tablet 2. How are you currently taking this medication (dosage and times per day)? As prescribed  3. Are you having a reaction (difficulty breathing--STAT)? No  4. What is your medication issue? [place] from [facility] is requesting order be sent to nurse Jefm Bryant , for medications/ Please call [phone] ext [phone], fax [phone].

## 2017-09-08 NOTE — Telephone Encounter (Signed)
I'm calling for my wife [name]. She was supposed to see Dr. Milinda Pointer on 12 February but she was sick and unable to come. She is having a lot of pain from her toenails and calluses and we were wanting to know if she could see or talk to Dr. Milinda Pointer one day next week other than Monday to at least have her calluses trimmed. If you would give me a call back at [phone] I would appreciate it. Thank you.

## 2017-09-08 NOTE — Telephone Encounter (Signed)
I called and left a voicemail twice to schedule an appointment

## 2017-09-08 NOTE — Telephone Encounter (Signed)
test

## 2017-09-08 NOTE — Telephone Encounter (Signed)
[facility]iscuss[facility] with [facility] [place] nurse all questions answered.

## 2017-09-09 ENCOUNTER — Telehealth: Payer: Self-pay | Admitting: *Deleted

## 2017-09-09 NOTE — Telephone Encounter (Signed)
[name] - [facility] states pt is in their center for surgery recovery and the service our office is to provide 09/13/2017 will only be covered for the debridement of the callous, no diagnostic testing or surgery will be covered by the insurance. I made note in pt's Appt.

## 2017-09-13 ENCOUNTER — Ambulatory Visit (INDEPENDENT_AMBULATORY_CARE_PROVIDER_SITE_OTHER): Payer: MEDICARE | Admitting: Podiatry

## 2017-09-13 ENCOUNTER — Encounter: Payer: Self-pay | Admitting: Podiatry

## 2017-09-13 DIAGNOSIS — D689 Coagulation defect, unspecified: Secondary | ICD-10-CM | POA: Diagnosis not present

## 2017-09-13 DIAGNOSIS — M79676 Pain in unspecified toe(s): Secondary | ICD-10-CM

## 2017-09-13 DIAGNOSIS — B351 Tinea unguium: Secondary | ICD-10-CM

## 2017-09-13 DIAGNOSIS — Q828 Other specified congenital malformations of skin: Secondary | ICD-10-CM

## 2017-09-13 NOTE — Progress Notes (Signed)
She presents today for chief complaint of painful elongated toenails and calluses bilaterally.  She is been sick recently in the hospital after a cholecystectomy with a cardiac arrhythmia.  She is currently in health care and should be going back to her apartment soon.  Objective: Vital signs are stable she is alert and oriented x3 pulses are palpable.  Walks with her walker today.  Toenails are thick yellow dystrophic-like mycotic painful palpation as well as debridement.  She is also currently on blood thinner and also having pain with reactive hyperkeratosis plantar aspect of bilateral foot.  Assessment: Pain in limb secondary to onychomycosis porokeratosis  Plan: Debridement of toenails 1 through 5 bilateral.  Debridement of all reactive hyperkeratosis.

## 2017-09-19 LAB — COMPLETE METABOLIC PANEL WITH GFR
ALBUMIN: 2.8
ALK PHOS: 87
ALT: 23
AST: 18
BUN: 16 (ref 4–21)
Calcium: 7.3
Creat: 0.97
Glucose: 108
Potassium: 3.7
Sodium: 145
TOTAL PROTEIN: 5.1 g/dL
Total Bilirubin: 0.5

## 2017-09-19 LAB — CBC
HCT: 29.1
HGB: 9.6
WBC: 4.5
platelet count: 278

## 2017-09-19 LAB — LIPID PANEL
Cholesterol: 109
HDL: 31
LDL CALC: 59
TRIGLYCERIDES: 107

## 2017-09-20 ENCOUNTER — Encounter: Payer: Self-pay | Admitting: *Deleted

## 2017-09-20 ENCOUNTER — Encounter: Payer: Self-pay | Admitting: Family

## 2017-09-20 ENCOUNTER — Non-Acute Institutional Stay (SKILLED_NURSING_FACILITY): Payer: MEDICARE | Admitting: Family

## 2017-09-20 DIAGNOSIS — J309 Allergic rhinitis, unspecified: Secondary | ICD-10-CM

## 2017-09-20 DIAGNOSIS — R1312 Dysphagia, oropharyngeal phase: Secondary | ICD-10-CM

## 2017-09-20 DIAGNOSIS — I5030 Unspecified diastolic (congestive) heart failure: Secondary | ICD-10-CM

## 2017-09-20 DIAGNOSIS — R2681 Unsteadiness on feet: Secondary | ICD-10-CM

## 2017-09-20 DIAGNOSIS — E782 Mixed hyperlipidemia: Secondary | ICD-10-CM | POA: Diagnosis not present

## 2017-09-20 DIAGNOSIS — K219 Gastro-esophageal reflux disease without esophagitis: Secondary | ICD-10-CM

## 2017-09-20 DIAGNOSIS — I10 Essential (primary) hypertension: Secondary | ICD-10-CM | POA: Diagnosis not present

## 2017-09-20 DIAGNOSIS — M150 Primary generalized (osteo)arthritis: Secondary | ICD-10-CM

## 2017-09-20 DIAGNOSIS — M159 Polyosteoarthritis, unspecified: Secondary | ICD-10-CM

## 2017-09-20 DIAGNOSIS — I4891 Unspecified atrial fibrillation: Secondary | ICD-10-CM | POA: Diagnosis not present

## 2017-09-20 NOTE — Progress Notes (Signed)
Location:  [place] Room Number: 30 Place of Service:  SNF [phone])  Provider: Marlowe Sax FNP-C   PCP: Marletta Lor, MD Patient Care Team: Marletta Lor, MD as PCP - General Erroll Luna, MD as Consulting Physician (General Surgery) Magrinat, Virgie Dad, MD as Consulting Physician (Oncology) Arloa Koh, MD as Consulting Physician (Radiation Oncology) [name], RN as Registered Nurse [name], RN as Registered Nurse [name], NP as Nurse Practitioner (Nurse Practitioner) [name], MD as Consulting Physician (Gastroenterology)  Extended Emergency Contact Information Primary Emergency Contact: Oneal Grout Address: [facility]          [address]          [place]          [place], [place] [address] [place] Phone: [phone] Relation: Spouse  Code Status: DNR Goals of care:  Advanced Directive information Advanced Directives 09/20/2017  Does Patient Have a Medical Advance Directive? Yes  Type of Advance Directive Out of facility DNR (pink MOST or yellow form)  Does patient want to make changes to medical advance directive? -  Copy of [place] in Chart? -  Pre-existing out of facility DNR order (yellow form or pink MOST form) Yellow form placed in chart (order not valid for inpatient use)     Allergies  Allergen Reactions  . Celecoxib Swelling    REACTION: Swelling-leg    Chief Complaint  Patient presents with  . Discharge Note    HPI:  84 y.o. female seen today at [facility] for discharge back to independent Living.She sas here for short term rehabilitation for post hospital admission from 08/25/2017-08/30/2017 with atrial fibrillation with RVR.She was started on cardizem drip then transitioned to metoprolol 50 mg tablet twice daily.She was seen by cardiology was consulted.She was noted to have NSTEMI on workup. Medical management was  recommended. Her Echocardiogram showed apical akinesis which is a new finding for her with EF 50-55%. She was placed on heparin drip for anticoagulation and later switched to eliquis. Of note she is also status post recent cholecystectomy.She was discharged to rehab for short term rehabilitation. She was living in [place] prior to hospital admission.She has had unremarkable stay here in rehab.Her liver enzymes were elevated AST 63,ALT 87 ( 09/05/2017).Suspect possible due to statin and recent cholecystectomy could be a contributory factor. Her atorvastatin was reduce to 40 mg Tablet with much improvement.Recent lipid panel result showed LDL 59 (09/19/2017).  She has worked well with PT/OT now stable for discharge back to independent Living.She will discharge with PT/OT to continue with ROM, Exercise, Gait stability and muscle strengthening.She will also need Speech therapy for dysphagia.She does not require any DME has own FWW and cane.she states PT recommended use of cane within the room and FWW while walking long distance. Discharge process will be arranged by facility social worker prior to discharge.she will be discharged with her Prescription medication from the facility then patient to follow up with PCP in 1-2 weeks.she denies any acute issues this visit. Facility staff report no new concerns.  Past Medical History:  Diagnosis Date  . Allergic rhinitis, seasonal   . Breast cancer of upper-outer quadrant of right female breast ([place]) 09/13/2014  . Chronic venous insufficiency   . Dermatophytosis of nail   . Edema leg   . Family history of malignant neoplasm of breast   . Family history of malignant neoplasm of ovary   .  Goiter    right thyroid nodule   . HTN (hypertension) 08/13/2017  . HX: breast cancer    bilateral  . Menopausal syndrome   . Osteoarthritis   . Osteoporosis   . Squamous cell skin cancer    right lower leg  . Wears glasses   . Wears hearing aid    both ears    Past Surgical  History:  Procedure Laterality Date  . bilateral lumpectomies for breast cancer  Aug. 2007  . BREAST LUMPECTOMY WITH RADIOACTIVE SEED LOCALIZATION Right 10/01/2014   Procedure: BREAST LUMPECTOMY WITH RADIOACTIVE SEED LOCALIZATION;  Surgeon: Erroll Luna, MD;  Location: [facility];  Service: General;  Laterality: Right;  . CATARACT EXTRACTION    . CHOLECYSTECTOMY N/A 08/13/2017   Procedure: LAPAROSCOPIC CHOLECYSTECTOMY WITH INTRAOPERATIVE CHOLANGIOGRAM;  Surgeon: Excell Seltzer, MD;  Location: [facility];  Service: General;  Laterality: N/A;  . DILATION AND CURETTAGE OF UTERUS    . ESOPHAGOGASTRODUODENOSCOPY  07/26/2011   Procedure: ESOPHAGOGASTRODUODENOSCOPY (EGD);  Surgeon: Jeryl Columbia, MD;  Location: [facility];  Service: Endoscopy;  Laterality: N/A;  . history of lumbar compression fracture    . left eardum sx  1983  . left knee arthroscopic surgery    . no screening colonoscopy    . s/p bilateral lumpectomies    . SAVORY DILATION  07/26/2011   Procedure: SAVORY DILATION;  Surgeon: Jeryl Columbia, MD;  Location: [facility];  Service: Endoscopy;  Laterality: N/A;  . status post resection squamous cell cancer right lower leg    . TONSILLECTOMY    . UMBILICAL HERNIA REPAIR        reports that  has never smoked. she has never used smokeless tobacco. She reports that she does not drink alcohol or use drugs. Social History   Socioeconomic History  . Marital status: Married    Spouse name: Not on file  . Number of children: Not on file  . Years of education: Not on file  . Highest education level: Not on file  Social Needs  . Financial resource strain: Not on file  . Food insecurity - worry: Not on file  . Food insecurity - inability: Not on file  . Transportation needs - medical: Not on file  . Transportation needs - non-medical: Not on file  Occupational History  . Not on file  Tobacco Use  . Smoking status: Never Smoker  . Smokeless tobacco: Never Used    Substance and Sexual Activity  . Alcohol use: No  . Drug use: No  . Sexual activity: Not on file  Other Topics Concern  . Not on file  Social History Narrative  . Not on file    Allergies  Allergen Reactions  . Celecoxib Swelling    REACTION: Swelling-leg    Pertinent  Health Maintenance Due  Topic Date Due  . MAMMOGRAM  04/09/2017  . INFLUENZA VACCINE  Completed  . DEXA SCAN  Completed  . PNA vac Low Risk Adult  Completed    Medications: Outpatient Encounter Medications as of 09/20/2017  Medication Sig  . acetaminophen (TYLENOL) 325 MG tablet Take 650 mg by mouth every 4 (four) hours as needed for mild pain or headache.  . Aloe-Sodium Chloride (AYR SALINE NASAL GEL NA) Place 1 spray into the nose 3 (three) times daily.  Marland Kitchen apixaban (ELIQUIS) 5 MG TABS tablet Take 1 tablet (5 mg total) by mouth 2 (two) times daily.  Marland Kitchen atorvastatin (LIPITOR) 40 MG tablet Take 40 mg  by mouth daily.  . Calcium Carb-Cholecalciferol (CALCIUM 600 + D PO) Take 1 tablet by mouth daily.   . clopidogrel (PLAVIX) 75 MG tablet Take 1 tablet (75 mg total) by mouth daily.  . feeding supplement (BOOST HIGH PROTEIN) LIQD Take 1 Container by mouth daily.  . fluticasone (FLONASE) 50 MCG/ACT nasal spray Place 2 sprays into both nostrils daily.  . furosemide (LASIX) 40 MG tablet Take 0.5 tablets (20 mg total) by mouth daily.  Marland Kitchen guaifenesin (ROBITUSSIN) 100 MG/5ML syrup Take 10 mLs by mouth 3 (three) times daily as needed for cough.  Marland Kitchen ketoconazole (NIZORAL) 2 % shampoo Apply 1 application topically every 7 (seven) days.   Marland Kitchen loperamide (IMODIUM A-D) 2 MG tablet Take 2 mg by mouth as needed for diarrhea or loose stools.  Marland Kitchen loratadine (CLARITIN) 10 MG tablet Take 10 mg by mouth daily.  . metoprolol tartrate (LOPRESSOR) 50 MG tablet Take 50 mg by mouth daily.   . Multiple Vitamins-Minerals (PRESERVISION AREDS 2) CAPS Take 2 capsules by mouth 2 (two) times daily.   . pantoprazole (PROTONIX) 40 MG tablet Take 40 mg by  mouth daily.    No facility-administered encounter medications on file as of 09/20/2017.      Review of Systems  Constitutional: Negative for appetite change, chills, fatigue and fever.  HENT: Positive for hearing loss and trouble swallowing. Negative for congestion, rhinorrhea, sinus pressure, sinus pain, sneezing and sore throat.   Eyes: Positive for visual disturbance. Negative for discharge, redness and itching.       Wears eye glasses   Respiratory: Negative for cough, chest tightness, shortness of breath and wheezing.   Cardiovascular: Negative for chest pain, palpitations and leg swelling.  Gastrointestinal: Negative for abdominal distention, abdominal pain, constipation, diarrhea, nausea and vomiting.  Endocrine: Negative for cold intolerance, heat intolerance, polydipsia, polyphagia and polyuria.  Genitourinary: Negative for dysuria, frequency and urgency.  Musculoskeletal: Positive for arthralgias and gait problem.  Skin: Negative for color change, pallor, rash and wound.  Neurological: Negative for dizziness, syncope, light-headedness and headaches.  Hematological: Does not bruise/bleed easily.  Psychiatric/Behavioral: Negative for agitation, confusion and sleep disturbance. The patient is not nervous/anxious.     Vitals:   09/20/17 1053  BP: 126/61  Pulse: 85  Resp: 20  Temp: 97.7 F (36.5 C)  SpO2: 98%  Weight: 160 lb (72.6 kg)  Height: 5\' 3"  (1.6 m)   Body mass index is 28.34 kg/m. Physical Exam  Constitutional: She appears well-developed and well-nourished.  Elderly in no acute distress   HENT:  Head: Normocephalic.  Mouth/Throat: Oropharynx is clear and moist. No oropharyngeal exudate.  Hearing aid in place   Eyes: Conjunctivae and EOM are normal. Pupils are equal, round, and reactive to light. Right eye exhibits no discharge. Left eye exhibits no discharge. No scleral icterus.  Eye glasses in place   Neck: Normal range of motion. No JVD present. No  thyromegaly present.  Lymphadenopathy:    She has no cervical adenopathy.    Labs reviewed: Basic Metabolic Panel: Recent Labs    08/13/17 0852 08/14/17 0449  08/27/17 0636 08/28/17 0746 08/29/17 0629 09/05/17  NA  --   --    < > 138 137 139 138  K  --   --    < > 4.1 4.0 3.9 3.7  CL  --   --    < > 104 103 102  --   CO2  --   --    < >  22 23 24   --   GLUCOSE  --   --    < > 151* 135* 129*  --   BUN  --   --    < > 25* 24* 20 16  CREATININE  --   --    < > 1.35* 1.36* 1.32* 1.01  CALCIUM  --   --    < > 8.3* 8.0* 8.3* 7.5  MG 1.7 1.7  --   --   --   --   --    < > = values in this interval not displayed.   Liver Function Tests: Recent Labs    08/14/17 0449 08/15/17 0402 08/19/17 1529 09/05/17  AST 137* 79* 27 63  ALT 263* 196* 78* 87  ALKPHOS 183* 188*  --  114  BILITOT 0.5 0.7 0.5 0.7  PROT 5.5* 5.4* 5.8* 5.3  ALBUMIN 3.0* 2.9*  --  2.8   Recent Labs    08/12/17 0605  LIPASE 33   CBC: Recent Labs    08/12/17 0605  08/25/17 1751 08/26/17 0242 08/27/17 0636 09/05/17  WBC 9.5   < > 11.1* 9.5 8.7 7.8  NEUTROABS 9.2*  --  8.7*  --   --   --   HGB 12.1   < > 11.9* 10.5* 11.3* 10.2  HCT 36.5   < > 37.6 34.2* 35.9* 29.4  MCV 90.1   < > 92.8 92.2 92.3  --   PLT 161   < > 428* 379 364  --    < > = values in this interval not displayed.   Cardiac Enzymes: Recent Labs    08/25/17 2330 08/26/17 0242 08/26/17 0934  TROPONINI 6.39* 7.30* 4.21*    Procedures and Imaging Studies During Stay: Dg Chest 2 View  Result Date: 08/25/2017 CLINICAL DATA:  Atrial fibrillation. Shortness of breath beginning today. EXAM: CHEST  2 VIEW COMPARISON:  08/12/2017 FINDINGS: Heart size upper limits of normal. Chronic aortic atherosclerosis. Pulmonary venous hypertension with early interstitial edema. No consolidation or collapse. Tiny amount of pleural fluid in the posterior costophrenic angles. IMPRESSION: Pulmonary venous hypertension with early interstitial edema. Electronically  Signed   By: Nelson Chimes M.D.   On: 08/25/2017 20:11   Ct Angio Chest Pe W Or Wo Contrast  Result Date: 08/26/2017 CLINICAL DATA:  Tachycardia with elevated D-dimer. Recent cholecystectomy. History of breast carcinoma EXAM: CT ANGIOGRAPHY CHEST WITH CONTRAST TECHNIQUE: Multidetector CT imaging of the chest was performed using the standard protocol during bolus administration of intravenous contrast. Multiplanar CT image reconstructions and MIPs were obtained to evaluate the vascular anatomy. CONTRAST:  16mL ISOVUE-370 IOPAMIDOL (ISOVUE-370) INJECTION 76% COMPARISON:  Chest radiograph August 25, 2017 FINDINGS: Cardiovascular: There is no demonstrable pulmonary embolus. There is no thoracic aortic aneurysm. No dissection is evident. It must be cautioned that the contrast bolus is not targeted toward the aorta, in the contrast bolus in the aorta is not felt to be sufficient to exclude possible dissection from an imaging standpoint. There is aortic atherosclerosis. There is calcification in the proximal visualized great vessels as well as foci of coronary artery calcification evident. There is a small pericardial effusion. Pericardium does not appear appreciably thickened. There is mitral annular calcification. Mediastinum/Nodes: Visualized thyroid appears unremarkable. There is a lymph node to the right of the distal trachea anteriorly measuring 1.7 x 1.5 cm. There is a lymph node to the left of the carina measuring 1.8 x 1.2 cm. There are scattered subcentimeter mediastinal  lymph nodes as well. No esophageal lesions are evident. Lungs/Pleura: There are moderate pleural effusions bilaterally. There is patchy airspace consolidation in posterior lung bases, largely due to compressive atelectasis. A degree of superimposed pneumonia in the bases cannot be excluded, however. There is a degree of interstitial edema in the bases as well. There is an azygos lobe on the right, an anatomic variant. Upper Abdomen: In the  visualized upper abdomen, there is aortic atherosclerosis. 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 Visualized upper abdominal structures otherwise appear unremarkable. Musculoskeletal: There is degenerative change in the thoracic spine with diffuse idiopathic skeletal hyperostosis. There is anterior wedging of the L2 vertebral body, incompletely visualized. There are no blastic or lytic bone lesions. Review of the MIP images confirms the above findings. IMPRESSION: 1.  No demonstrable pulmonary embolus. 2. No thoracic aortic aneurysm. There is extensive aortic and great vessel calcification as well as foci of coronary artery calcification. No dissection evident; note that the contrast bolus is not sufficient to exclude this entity from an imaging standpoint. 3. Pleural effusions bilaterally with compressive atelectasis in both lung bases. There may be superimposed pneumonia in the lung bases. There is bibasilar interstitial edema. There is felt to be a degree of underlying congestive heart failure. 4. Mildly enlarged pericarinal region lymph nodes. Suspect reactive etiology given the parenchymal lung changes. 5.  Small pericardial effusion. 6. 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 Aortic Atherosclerosis (ICD10-I70.0). Electronically Signed   By: Lowella Grip III M.D.   On: 08/26/2017 17:21   Dg Chest Port 1 View  Result Date: 08/29/2017 CLINICAL DATA:  84 year old female with shortness breath and cough. Breast cancer post lumpectomies 6 years ago. Subsequent encounter. EXAM: PORTABLE CHEST 1 VIEW COMPARISON:  08/26/2017 chest CT. 08/25/2017 and 08/12/2017 chest x-ray. FINDINGS: Interval slight decrease in degree of pulmonary vascular congestion. CT detected pleural effusions and basal atelectasis not as well delineated on the present plain film exam. Cardiomegaly. Calcified  aorta. Curvature thoracic spine. IMPRESSION: Interval slight decrease in degree of pulmonary vascular congestion. CT noted pleural effusions and basilar atelectasis not as well delineated on present plain film exam. Cardiomegaly. Aortic Atherosclerosis (ICD10-I70.0). Electronically Signed   By: Genia Del M.D.   On: 08/29/2017 09:57   Assessment/Plan:     1. Essential hypertension B/p stable.continue on metoprolol tartrate 50 mg tablet daily.monitor BMP  2. Mixed hyperlipidemia LDL 59 (09/19/2017).Reduce atorvastatin to 20 mg tablet daily.Recheck lipid panel in 3 months.   3. Diastolic congestive heart failure Recent Echocardiogram showed apical akinesis which is a new finding for her with EF 50-55%.No abrupt weight gain.exam findings negative for cough,shortness of breath or wheezing.continue on furosemide 20 mg tablet daily and metoprolol tartrate 50 mg tablet daily.monitor BMP.  4. Gastroesophageal reflux disease Stable.continue on Protonix 40 mg tablet daily.   5. Oropharyngeal dysphagia Will discharge will speech therapy to evaluate and treat as indicated for swallowing problems.Has had esophageal stretching in the past.contintinue to monitor.   6. Primary osteoarthritis involving multiple joints Continue on tylenol as needed.continue to exercise with therapy.   7. Allergic rhinitis Continue on Flonase 50 mcg/ACT nasal spray and loratadine 10 mg tablet daily.   8. Unsteady gait Has worked well with PT/ OT. Will discharge to independent living with PT/OT to continue with ROM,Exercise,Gait stability and muscle strengthening.She does not require any DME has own FWW and cane.Fall  and safety precautions.  9. Afib  Newly diagnosed.status post hospital admission as above.continue on apixaban 5 mg tablet twice daily and clopidogrel 75 mg tablet daily.   Patient is being discharged with the following services:   -PT/OT for ROM, exercise, gait stability and muscle strengthening  Patient  is being discharged with the following durable medical equipment:   None.she has own FWW and cane.  Patient has been advised to f/u with their PCP in 1-2 weeks to for a transitions of care visit.Facility Social worker at the facility was responsible for arranging discharge process.Pt was discharge with medication from the facility.For controlled substances, a limited supply was provided as appropriate for the individual patient. If the pt normally receives these medications from a pain clinic or has a contract with another physician, these medications should be received from that clinic or physician only).    Future labs/tests needed:  CBC, BMP in 1-2 weeks PCP

## 2017-09-26 DIAGNOSIS — R1313 Dysphagia, pharyngeal phase: Secondary | ICD-10-CM | POA: Diagnosis not present

## 2017-09-26 DIAGNOSIS — R2681 Unsteadiness on feet: Secondary | ICD-10-CM | POA: Diagnosis not present

## 2017-09-26 DIAGNOSIS — I4891 Unspecified atrial fibrillation: Secondary | ICD-10-CM | POA: Diagnosis not present

## 2017-09-26 DIAGNOSIS — I214 Non-ST elevation (NSTEMI) myocardial infarction: Secondary | ICD-10-CM | POA: Diagnosis not present

## 2017-09-26 DIAGNOSIS — R29898 Other symptoms and signs involving the musculoskeletal system: Secondary | ICD-10-CM | POA: Diagnosis not present

## 2017-09-26 DIAGNOSIS — M6281 Muscle weakness (generalized): Secondary | ICD-10-CM | POA: Diagnosis not present

## 2017-09-27 ENCOUNTER — Telehealth: Payer: Self-pay | Admitting: Cardiovascular Disease

## 2017-09-27 DIAGNOSIS — R2681 Unsteadiness on feet: Secondary | ICD-10-CM | POA: Diagnosis not present

## 2017-09-27 DIAGNOSIS — I214 Non-ST elevation (NSTEMI) myocardial infarction: Secondary | ICD-10-CM | POA: Diagnosis not present

## 2017-09-27 DIAGNOSIS — R29898 Other symptoms and signs involving the musculoskeletal system: Secondary | ICD-10-CM | POA: Diagnosis not present

## 2017-09-27 DIAGNOSIS — M6281 Muscle weakness (generalized): Secondary | ICD-10-CM | POA: Diagnosis not present

## 2017-09-27 DIAGNOSIS — R1313 Dysphagia, pharyngeal phase: Secondary | ICD-10-CM | POA: Diagnosis not present

## 2017-09-27 DIAGNOSIS — I4891 Unspecified atrial fibrillation: Secondary | ICD-10-CM | POA: Diagnosis not present

## 2017-09-27 MED ORDER — METOPROLOL SUCCINATE ER 50 MG PO TB24: 50.0000 mg | ORAL_TABLET | Freq: Every day | ORAL | 3 refills | Status: DC

## 2017-09-27 NOTE — Telephone Encounter (Signed)
RETURNED [facility] WITH METOPROLOL DOSE.  D/W [facility] SHE STATES PT WAS SWITCHED FROM TARTRATE 50MG  BID TO SUCCINATE 50MG  IN THE HS.  PT/CAREGIVER NOTIFIED(PT SAID TO LET HER KNOW TO UPDATE HUSBAND CORRECTLY) NOTIFIED. NEW RX SENT TO REQUESTED PHARMACY

## 2017-09-27 NOTE — Telephone Encounter (Signed)
She should be on METOPROLOL SUCCINATE 50 mg at HS.  She was on METOPROLOL TARTRATE (short acting) twice a day. We changed to make it easier on her dosing due to polypharmacy.

## 2017-09-27 NOTE — Telephone Encounter (Signed)
Please clarify metoprolol dose and directions.

## 2017-09-27 NOTE — Telephone Encounter (Signed)
New Message     Husband calling to confirm what dosage patient is suppose to be taking  metoprolol tartrate (LOPRESSOR) 50 MG tablet Take 50 mg by mouth daily.    Patient is only taking one at bed time, is this correct ?

## 2017-09-28 DIAGNOSIS — R29898 Other symptoms and signs involving the musculoskeletal system: Secondary | ICD-10-CM | POA: Diagnosis not present

## 2017-09-28 DIAGNOSIS — R1313 Dysphagia, pharyngeal phase: Secondary | ICD-10-CM | POA: Diagnosis not present

## 2017-09-28 DIAGNOSIS — M6281 Muscle weakness (generalized): Secondary | ICD-10-CM | POA: Diagnosis not present

## 2017-09-28 DIAGNOSIS — R2681 Unsteadiness on feet: Secondary | ICD-10-CM | POA: Diagnosis not present

## 2017-09-28 DIAGNOSIS — I214 Non-ST elevation (NSTEMI) myocardial infarction: Secondary | ICD-10-CM | POA: Diagnosis not present

## 2017-09-28 DIAGNOSIS — I4891 Unspecified atrial fibrillation: Secondary | ICD-10-CM | POA: Diagnosis not present

## 2017-09-29 DIAGNOSIS — R1313 Dysphagia, pharyngeal phase: Secondary | ICD-10-CM | POA: Diagnosis not present

## 2017-09-29 DIAGNOSIS — I214 Non-ST elevation (NSTEMI) myocardial infarction: Secondary | ICD-10-CM | POA: Diagnosis not present

## 2017-09-29 DIAGNOSIS — R29898 Other symptoms and signs involving the musculoskeletal system: Secondary | ICD-10-CM | POA: Diagnosis not present

## 2017-09-29 DIAGNOSIS — M6281 Muscle weakness (generalized): Secondary | ICD-10-CM | POA: Diagnosis not present

## 2017-09-29 DIAGNOSIS — R2681 Unsteadiness on feet: Secondary | ICD-10-CM | POA: Diagnosis not present

## 2017-09-29 DIAGNOSIS — I4891 Unspecified atrial fibrillation: Secondary | ICD-10-CM | POA: Diagnosis not present

## 2017-09-30 DIAGNOSIS — M6281 Muscle weakness (generalized): Secondary | ICD-10-CM | POA: Diagnosis not present

## 2017-09-30 DIAGNOSIS — I214 Non-ST elevation (NSTEMI) myocardial infarction: Secondary | ICD-10-CM | POA: Diagnosis not present

## 2017-09-30 DIAGNOSIS — R2681 Unsteadiness on feet: Secondary | ICD-10-CM | POA: Diagnosis not present

## 2017-09-30 DIAGNOSIS — I4891 Unspecified atrial fibrillation: Secondary | ICD-10-CM | POA: Diagnosis not present

## 2017-09-30 DIAGNOSIS — R1313 Dysphagia, pharyngeal phase: Secondary | ICD-10-CM | POA: Diagnosis not present

## 2017-09-30 DIAGNOSIS — R29898 Other symptoms and signs involving the musculoskeletal system: Secondary | ICD-10-CM | POA: Diagnosis not present

## 2017-10-03 ENCOUNTER — Telehealth: Payer: Self-pay | Admitting: Cardiovascular Disease

## 2017-10-03 DIAGNOSIS — R1313 Dysphagia, pharyngeal phase: Secondary | ICD-10-CM | POA: Diagnosis not present

## 2017-10-03 DIAGNOSIS — I4891 Unspecified atrial fibrillation: Secondary | ICD-10-CM | POA: Diagnosis not present

## 2017-10-03 DIAGNOSIS — M6281 Muscle weakness (generalized): Secondary | ICD-10-CM | POA: Diagnosis not present

## 2017-10-03 DIAGNOSIS — R2681 Unsteadiness on feet: Secondary | ICD-10-CM | POA: Diagnosis not present

## 2017-10-03 DIAGNOSIS — I214 Non-ST elevation (NSTEMI) myocardial infarction: Secondary | ICD-10-CM | POA: Diagnosis not present

## 2017-10-03 DIAGNOSIS — R29898 Other symptoms and signs involving the musculoskeletal system: Secondary | ICD-10-CM | POA: Diagnosis not present

## 2017-10-03 MED ORDER — ATORVASTATIN CALCIUM 40 MG PO TABS: 40.0000 mg | ORAL_TABLET | Freq: Every day | ORAL | 3 refills | Status: DC

## 2017-10-03 MED ORDER — FUROSEMIDE 20 MG PO TABS: 20.0000 mg | ORAL_TABLET | Freq: Every day | ORAL | 3 refills | Status: DC

## 2017-10-03 NOTE — Telephone Encounter (Signed)
Verified medications with pharmacist.  New rx sent for furosemide and atorvastatin

## 2017-10-03 NOTE — Telephone Encounter (Signed)
New Message:   [facility] calling verify dosages for patients medication. What's on file in not what husband has provided to pharmacy. Pt is trying to establish a pharmacy package.    atorvastatin (LIPITOR) 40 MG tablet  furosemide (LASIX) 40 MG tablet   metoprolol succinate (TOPROL-XL) 50 MG 24 hr tablet

## 2017-10-04 ENCOUNTER — Encounter: Payer: Self-pay | Admitting: Internal Medicine

## 2017-10-04 ENCOUNTER — Ambulatory Visit (INDEPENDENT_AMBULATORY_CARE_PROVIDER_SITE_OTHER): Payer: MEDICARE | Admitting: Internal Medicine

## 2017-10-04 VITALS — BP 110/70 | HR 68 | Temp 98.2°F | Wt 166.4 lb

## 2017-10-04 DIAGNOSIS — I480 Paroxysmal atrial fibrillation: Secondary | ICD-10-CM | POA: Insufficient documentation

## 2017-10-04 DIAGNOSIS — I248 Other forms of acute ischemic heart disease: Secondary | ICD-10-CM

## 2017-10-04 DIAGNOSIS — I214 Non-ST elevation (NSTEMI) myocardial infarction: Secondary | ICD-10-CM | POA: Diagnosis not present

## 2017-10-04 DIAGNOSIS — I10 Essential (primary) hypertension: Secondary | ICD-10-CM | POA: Diagnosis not present

## 2017-10-04 DIAGNOSIS — M6281 Muscle weakness (generalized): Secondary | ICD-10-CM | POA: Diagnosis not present

## 2017-10-04 DIAGNOSIS — R2681 Unsteadiness on feet: Secondary | ICD-10-CM | POA: Diagnosis not present

## 2017-10-04 DIAGNOSIS — R29898 Other symptoms and signs involving the musculoskeletal system: Secondary | ICD-10-CM | POA: Diagnosis not present

## 2017-10-04 DIAGNOSIS — I4891 Unspecified atrial fibrillation: Secondary | ICD-10-CM | POA: Diagnosis not present

## 2017-10-04 DIAGNOSIS — R1313 Dysphagia, pharyngeal phase: Secondary | ICD-10-CM | POA: Diagnosis not present

## 2017-10-04 MED ORDER — PANTOPRAZOLE SODIUM 20 MG PO TBEC: 20.0000 mg | DELAYED_RELEASE_TABLET | Freq: Every day | ORAL | 5 refills | Status: DC

## 2017-10-04 NOTE — Patient Instructions (Signed)
Limit your sodium (Salt) intake  Cardiology follow-up as scheduled  Return here in 3 months for follow-up

## 2017-10-04 NOTE — Progress Notes (Signed)
Subjective:    Patient ID: [name], female    DOB: 07-Jul-1922, 84 y.o.   MRN: [id number]  HPI  84 year old patient who is seen today in follow-up.  In January she was admitted to the hospital for laparoscopic cholecystectomy due to acute cholecystitis.  Approximately 5 weeks ago she was readmitted with atrial fibrillation with rapid ventricular response.  3 weeks ago she was seen in outpatient follow-up by cardiology.  The patient had a NSTEMI in the setting of atrial fibrillation.  The patient was discharged on a regimen of Plavix and Eliquis.  She has been maintained on statin therapy as well as metoprolol.  She is concerned about the cost of Eliquis which is costing her out-of-pocket $300 per month .  She is scheduled for cardiology follow-up in 1 month Clinically, she is doing well  Past Medical History:  Diagnosis Date  . Allergic rhinitis, seasonal   . Breast cancer of upper-outer quadrant of right female breast ([place]) 09/13/2014  . Chronic venous insufficiency   . Dermatophytosis of nail   . Edema leg   . Family history of malignant neoplasm of breast   . Family history of malignant neoplasm of ovary   . Goiter    right thyroid nodule   . HTN (hypertension) 08/13/2017  . HX: breast cancer    bilateral  . Menopausal syndrome   . Osteoarthritis   . Osteoporosis   . Squamous cell skin cancer    right lower leg  . Wears glasses   . Wears hearing aid    both ears     Social History   Socioeconomic History  . Marital status: Married    Spouse name: Not on file  . Number of children: Not on file  . Years of education: Not on file  . Highest education level: Not on file  Social Needs  . Financial resource strain: Not on file  . Food insecurity - worry: Not on file  . Food insecurity - inability: Not on file  . Transportation needs - medical: Not on file  . Transportation needs - non-medical: Not on file  Occupational History  . Not on file  Tobacco Use  .  Smoking status: Never Smoker  . Smokeless tobacco: Never Used  Substance and Sexual Activity  . Alcohol use: No  . Drug use: No  . Sexual activity: Not on file  Other Topics Concern  . Not on file  Social History Narrative  . Not on file    Past Surgical History:  Procedure Laterality Date  . bilateral lumpectomies for breast cancer  Aug. 2007  . BREAST LUMPECTOMY WITH RADIOACTIVE SEED LOCALIZATION Right 10/01/2014   Procedure: BREAST LUMPECTOMY WITH RADIOACTIVE SEED LOCALIZATION;  Surgeon: Erroll Luna, MD;  Location: [facility];  Service: General;  Laterality: Right;  . CATARACT EXTRACTION    . CHOLECYSTECTOMY N/A 08/13/2017   Procedure: LAPAROSCOPIC CHOLECYSTECTOMY WITH INTRAOPERATIVE CHOLANGIOGRAM;  Surgeon: Excell Seltzer, MD;  Location: [facility];  Service: General;  Laterality: N/A;  . DILATION AND CURETTAGE OF UTERUS    . ESOPHAGOGASTRODUODENOSCOPY  07/26/2011   Procedure: ESOPHAGOGASTRODUODENOSCOPY (EGD);  Surgeon: Jeryl Columbia, MD;  Location: [facility];  Service: Endoscopy;  Laterality: N/A;  . history of lumbar compression fracture    . left eardum sx  1983  . left knee arthroscopic surgery    . no screening colonoscopy    . s/p bilateral lumpectomies    . SAVORY DILATION  07/26/2011  Procedure: SAVORY DILATION;  Surgeon: Jeryl Columbia, MD;  Location: [facility];  Service: Endoscopy;  Laterality: N/A;  . status post resection squamous cell cancer right lower leg    . TONSILLECTOMY    . UMBILICAL HERNIA REPAIR      Family History  Problem Relation Age of Onset  . Cancer Mother 89       breast  . Heart attack Father   . Heart attack Brother   . Heart attack Brother   . Cancer Maternal Aunt 55       breast  . Coronary artery disease Other   . Cancer Other        ovarian; daughter of mat aunt w/ BC in 26s  . Heart disease Brother   . Cancer Maternal Aunt 60       breast    Allergies  Allergen Reactions  . Celecoxib Swelling    REACTION:  Swelling-leg    Current Outpatient Medications on File Prior to Visit  Medication Sig Dispense Refill  . acetaminophen (TYLENOL) 325 MG tablet Take 650 mg by mouth every 4 (four) hours as needed for mild pain or headache.    Marland Kitchen apixaban (ELIQUIS) 5 MG TABS tablet Take 1 tablet (5 mg total) by mouth 2 (two) times daily. 60 tablet 0  . atorvastatin (LIPITOR) 40 MG tablet Take 1 tablet (40 mg total) by mouth daily. 90 tablet 3  . Calcium Carb-Cholecalciferol (CALCIUM 600 + D PO) Take 1 tablet by mouth daily.     . clopidogrel (PLAVIX) 75 MG tablet Take 1 tablet (75 mg total) by mouth daily. 30 tablet 0  . fluticasone (FLONASE) 50 MCG/ACT nasal spray Place 2 sprays into both nostrils daily. 16 g 6  . furosemide (LASIX) 20 MG tablet Take 1 tablet (20 mg total) by mouth daily. 90 tablet 3  . ketoconazole (NIZORAL) 2 % shampoo Apply 1 application topically every 7 (seven) days.     Marland Kitchen loratadine (CLARITIN) 10 MG tablet Take 10 mg by mouth daily.    . metoprolol succinate (TOPROL-XL) 50 MG 24 hr tablet Take 1 tablet (50 mg total) by mouth at bedtime. Take with or immediately following a meal. 90 tablet 3  . Multiple Vitamins-Minerals (PRESERVISION AREDS 2) CAPS Take 2 capsules by mouth 2 (two) times daily.      No current facility-administered medications on file prior to visit.     BP 110/70   Pulse 68   Temp 98.2 F (36.8 C) (Oral)   Wt 166 lb 6.4 oz (75.5 kg)   SpO2 97%   BMI 29.48 kg/m     Review of Systems  Constitutional: Positive for fatigue.  HENT: Negative for congestion, dental problem, hearing loss, rhinorrhea, sinus pressure, sore throat and tinnitus.   Eyes: Negative for pain, discharge and visual disturbance.  Respiratory: Negative for cough and shortness of breath.   Cardiovascular: Negative for chest pain, palpitations and leg swelling.  Gastrointestinal: Negative for abdominal distention, abdominal pain, blood in stool, constipation, diarrhea, nausea and vomiting.    Genitourinary: Negative for difficulty urinating, dysuria, flank pain, frequency, hematuria, pelvic pain, urgency, vaginal bleeding, vaginal discharge and vaginal pain.  Musculoskeletal: Positive for gait problem. Negative for arthralgias and joint swelling.  Skin: Negative for rash.  Neurological: Negative for dizziness, syncope, speech difficulty, weakness, numbness and headaches.  Hematological: Negative for adenopathy.  Psychiatric/Behavioral: Negative for agitation, behavioral problems and dysphoric mood. The patient is not nervous/anxious.  Objective:   Physical Exam  Constitutional: She is oriented to person, place, and time. She appears well-developed and well-nourished.  HENT:  Head: Normocephalic.  Right Ear: External ear normal.  Left Ear: External ear normal.  Mouth/Throat: Oropharynx is clear and moist.  Eyes: Conjunctivae and EOM are normal. Pupils are equal, round, and reactive to light.  Neck: Normal range of motion. Neck supple. No thyromegaly present.  Cardiovascular: Normal rate, regular rhythm and intact distal pulses.  Murmur heard. Pulmonary/Chest: Effort normal and breath sounds normal.  Abdominal: Soft. Bowel sounds are normal. She exhibits no mass. There is no tenderness.  Musculoskeletal: Normal range of motion. She exhibits no edema.  Lymphadenopathy:    She has no cervical adenopathy.  Neurological: She is alert and oriented to person, place, and time.  Skin: Skin is warm and dry. No rash noted.  Psychiatric: She has a normal mood and affect. Her behavior is normal.          Assessment & Plan:   History of PAF with rapid ventricular response.  Present remains in normal sinus rhythm.  Remains on Eliquis anticoagulation.  Her husband is on Coumadin and she will discussed with cardiology about a possible switch to this cheaper alternative Status post non-ST segment elevated MI.  Continue Plavix metoprolol statin therapy thank you Status post  laparoscopic cholecystectomy  Cardiology follow-up as scheduled  return here in 4-6 months  [place]

## 2017-10-05 ENCOUNTER — Telehealth: Payer: Self-pay | Admitting: *Deleted

## 2017-10-05 ENCOUNTER — Telehealth: Payer: Self-pay | Admitting: Cardiovascular Disease

## 2017-10-05 DIAGNOSIS — I4891 Unspecified atrial fibrillation: Secondary | ICD-10-CM | POA: Diagnosis not present

## 2017-10-05 DIAGNOSIS — M6281 Muscle weakness (generalized): Secondary | ICD-10-CM | POA: Diagnosis not present

## 2017-10-05 DIAGNOSIS — R29898 Other symptoms and signs involving the musculoskeletal system: Secondary | ICD-10-CM | POA: Diagnosis not present

## 2017-10-05 DIAGNOSIS — R2681 Unsteadiness on feet: Secondary | ICD-10-CM | POA: Diagnosis not present

## 2017-10-05 DIAGNOSIS — I214 Non-ST elevation (NSTEMI) myocardial infarction: Secondary | ICD-10-CM | POA: Diagnosis not present

## 2017-10-05 DIAGNOSIS — R1313 Dysphagia, pharyngeal phase: Secondary | ICD-10-CM | POA: Diagnosis not present

## 2017-10-05 MED ORDER — METOPROLOL SUCCINATE ER 50 MG PO TB24: 50.0000 mg | ORAL_TABLET | Freq: Every day | ORAL | 0 refills | Status: DC

## 2017-10-05 NOTE — Telephone Encounter (Signed)
Rx(s) sent to pharmacy electronically.  

## 2017-10-05 NOTE — Telephone Encounter (Signed)
New message     Dr c changed patient from  Metoprolol tartrate 50mg   To  metoprolol succinate (TOPROL-XL) 50 MG 24 hr tablet Take 1 tablet (50 mg total) by mouth at bedtime. Take with or immediately following a meal.   They a prescription for the  metoprolol succinate (TOPROL-XL) 50 MG 24 hr tablet Take 1 tablet (50 mg total) by mouth at bedtime. Take with or immediately following a meal.    called into [facility] at [address] for ( 1 week supply til mail order gets here )

## 2017-10-05 NOTE — Telephone Encounter (Signed)
New Message:   Pt's pharmacy is calling because pt was prescribed Plavix and Eliquis during a hospital visit. They need to know if she still should be taken this medicine do to a care package for her medicine being made for her.

## 2017-10-05 NOTE — Telephone Encounter (Signed)
Patient called back to speak with you. She is questioning if she should continue with DAPT of clopidogrel and eliquis. She requested a call back at [phone]. Thanks, MI

## 2017-10-05 NOTE — Telephone Encounter (Signed)
New message     Is the patient suppose to be taking both  clopidogrel (PLAVIX) 75 MG tablet Take 1 tablet (75 mg total) by mouth daily.   apixaban (ELIQUIS) 5 MG TABS tablet Take 1 tablet (5 mg total) by mouth 2 (two) times daily.

## 2017-10-05 NOTE — Telephone Encounter (Signed)
From 09/08/17 office note:   Due to fatigue, likely from deconditioning, but may be related to metoprolol as well, I will change her metoprolol tartrate 50 mg BID to long acting metoprolol succinate 50 mg @ HS. This may also help with her appetite as well.   Returned call to patient and explained her medication changes. Reiterated that metoprolol tartrate was stopped and changed to metoprolol succinate 50mg  QHS. Patient had a bottle of metoprolol tartrate, explained she should discard this. She also has metoprolol succinate, advised this is the correct med and verified that she is aware how to take medication

## 2017-10-05 NOTE — Telephone Encounter (Signed)
New Message:      Please call,question about the whate dose she should be taking for her Metoprolol.

## 2017-10-05 NOTE — Telephone Encounter (Signed)
Returned call to patient. Explained that the ONLY med changes per last office visit were change in metoprolol (from tartrate to succinate) and starting eliquis.

## 2017-10-05 NOTE — Telephone Encounter (Signed)
Attempted to return call to patient. Phone rang, no answer 

## 2017-10-05 NOTE — Telephone Encounter (Signed)
Left message at local pharmacy  Per patient last office note - patient is taking both eliquis and plavix . unable to contact pharmacy 800 number care team specialist

## 2017-10-06 DIAGNOSIS — I214 Non-ST elevation (NSTEMI) myocardial infarction: Secondary | ICD-10-CM | POA: Diagnosis not present

## 2017-10-06 DIAGNOSIS — M6281 Muscle weakness (generalized): Secondary | ICD-10-CM | POA: Diagnosis not present

## 2017-10-06 DIAGNOSIS — R1313 Dysphagia, pharyngeal phase: Secondary | ICD-10-CM | POA: Diagnosis not present

## 2017-10-06 DIAGNOSIS — R2681 Unsteadiness on feet: Secondary | ICD-10-CM | POA: Diagnosis not present

## 2017-10-06 DIAGNOSIS — I4891 Unspecified atrial fibrillation: Secondary | ICD-10-CM | POA: Diagnosis not present

## 2017-10-06 DIAGNOSIS — R29898 Other symptoms and signs involving the musculoskeletal system: Secondary | ICD-10-CM | POA: Diagnosis not present

## 2017-10-06 NOTE — Telephone Encounter (Signed)
Follow Up:     Please call asap,needs to know what blood thinner pt is on.Marland Kitchen

## 2017-10-06 NOTE — Telephone Encounter (Signed)
Per recent hospital d/c patient sent home on both Plavix and Eliquis. Advised husband and Pharm D Otila Kluver

## 2017-10-07 ENCOUNTER — Telehealth: Payer: Self-pay

## 2017-10-07 DIAGNOSIS — I214 Non-ST elevation (NSTEMI) myocardial infarction: Secondary | ICD-10-CM | POA: Diagnosis not present

## 2017-10-07 DIAGNOSIS — R0989 Other specified symptoms and signs involving the circulatory and respiratory systems: Secondary | ICD-10-CM

## 2017-10-07 DIAGNOSIS — I4891 Unspecified atrial fibrillation: Secondary | ICD-10-CM | POA: Diagnosis not present

## 2017-10-07 DIAGNOSIS — R1313 Dysphagia, pharyngeal phase: Secondary | ICD-10-CM | POA: Diagnosis not present

## 2017-10-07 DIAGNOSIS — R2681 Unsteadiness on feet: Secondary | ICD-10-CM | POA: Diagnosis not present

## 2017-10-07 DIAGNOSIS — M6281 Muscle weakness (generalized): Secondary | ICD-10-CM | POA: Diagnosis not present

## 2017-10-07 DIAGNOSIS — R29898 Other symptoms and signs involving the musculoskeletal system: Secondary | ICD-10-CM | POA: Diagnosis not present

## 2017-10-07 NOTE — Telephone Encounter (Signed)
Copied from [place] [id number]. Topic: Referral - Request >> Oct 07, 2017  3:04 PM Arletha Grippe wrote: Reason for CRM: pt needs to have refferal to allergist - her nose runs all the time, ans she is congested.  Pt is asking for a recommendation and to know the allergist across the street is on you would recommend cb [phone].

## 2017-10-10 DIAGNOSIS — I4891 Unspecified atrial fibrillation: Secondary | ICD-10-CM | POA: Diagnosis not present

## 2017-10-10 DIAGNOSIS — R1313 Dysphagia, pharyngeal phase: Secondary | ICD-10-CM | POA: Diagnosis not present

## 2017-10-10 DIAGNOSIS — R2681 Unsteadiness on feet: Secondary | ICD-10-CM | POA: Diagnosis not present

## 2017-10-10 DIAGNOSIS — R29898 Other symptoms and signs involving the musculoskeletal system: Secondary | ICD-10-CM | POA: Diagnosis not present

## 2017-10-10 DIAGNOSIS — I214 Non-ST elevation (NSTEMI) myocardial infarction: Secondary | ICD-10-CM | POA: Diagnosis not present

## 2017-10-10 DIAGNOSIS — M6281 Muscle weakness (generalized): Secondary | ICD-10-CM | POA: Diagnosis not present

## 2017-10-10 NOTE — Telephone Encounter (Signed)
Okay for allergy referral per patient request

## 2017-10-10 NOTE — Telephone Encounter (Signed)
Called patient and left message to return call

## 2017-10-11 DIAGNOSIS — R29898 Other symptoms and signs involving the musculoskeletal system: Secondary | ICD-10-CM | POA: Diagnosis not present

## 2017-10-11 DIAGNOSIS — R1313 Dysphagia, pharyngeal phase: Secondary | ICD-10-CM | POA: Diagnosis not present

## 2017-10-11 DIAGNOSIS — I4891 Unspecified atrial fibrillation: Secondary | ICD-10-CM | POA: Diagnosis not present

## 2017-10-11 DIAGNOSIS — R2681 Unsteadiness on feet: Secondary | ICD-10-CM | POA: Diagnosis not present

## 2017-10-11 DIAGNOSIS — I214 Non-ST elevation (NSTEMI) myocardial infarction: Secondary | ICD-10-CM | POA: Diagnosis not present

## 2017-10-11 DIAGNOSIS — M6281 Muscle weakness (generalized): Secondary | ICD-10-CM | POA: Diagnosis not present

## 2017-10-11 NOTE — Telephone Encounter (Signed)
Referral placed. No further action needed.

## 2017-10-12 DIAGNOSIS — R29898 Other symptoms and signs involving the musculoskeletal system: Secondary | ICD-10-CM | POA: Diagnosis not present

## 2017-10-12 DIAGNOSIS — R2681 Unsteadiness on feet: Secondary | ICD-10-CM | POA: Diagnosis not present

## 2017-10-12 DIAGNOSIS — I214 Non-ST elevation (NSTEMI) myocardial infarction: Secondary | ICD-10-CM | POA: Diagnosis not present

## 2017-10-12 DIAGNOSIS — I4891 Unspecified atrial fibrillation: Secondary | ICD-10-CM | POA: Diagnosis not present

## 2017-10-12 DIAGNOSIS — M6281 Muscle weakness (generalized): Secondary | ICD-10-CM | POA: Diagnosis not present

## 2017-10-12 DIAGNOSIS — R1313 Dysphagia, pharyngeal phase: Secondary | ICD-10-CM | POA: Diagnosis not present

## 2017-10-13 DIAGNOSIS — R1313 Dysphagia, pharyngeal phase: Secondary | ICD-10-CM | POA: Diagnosis not present

## 2017-10-13 DIAGNOSIS — R29898 Other symptoms and signs involving the musculoskeletal system: Secondary | ICD-10-CM | POA: Diagnosis not present

## 2017-10-13 DIAGNOSIS — I4891 Unspecified atrial fibrillation: Secondary | ICD-10-CM | POA: Diagnosis not present

## 2017-10-13 DIAGNOSIS — I214 Non-ST elevation (NSTEMI) myocardial infarction: Secondary | ICD-10-CM | POA: Diagnosis not present

## 2017-10-13 DIAGNOSIS — R2681 Unsteadiness on feet: Secondary | ICD-10-CM | POA: Diagnosis not present

## 2017-10-13 DIAGNOSIS — M6281 Muscle weakness (generalized): Secondary | ICD-10-CM | POA: Diagnosis not present

## 2017-10-14 ENCOUNTER — Telehealth: Payer: Self-pay | Admitting: Adult Health

## 2017-10-14 MED ORDER — APIXABAN 5 MG PO TABS: 5.0000 mg | ORAL_TABLET | Freq: Two times a day (BID) | ORAL | 3 refills | Status: DC

## 2017-10-14 MED ORDER — CLOPIDOGREL BISULFATE 75 MG PO TABS: 75.0000 mg | ORAL_TABLET | Freq: Every day | ORAL | 3 refills | Status: DC

## 2017-10-14 MED ORDER — METOPROLOL SUCCINATE ER 50 MG PO TB24: 50.0000 mg | ORAL_TABLET | Freq: Every day | ORAL | 3 refills | Status: DC

## 2017-10-14 MED ORDER — ATORVASTATIN CALCIUM 40 MG PO TABS: 40.0000 mg | ORAL_TABLET | Freq: Every day | ORAL | 3 refills | Status: DC

## 2017-10-14 MED ORDER — FUROSEMIDE 20 MG PO TABS: 20.0000 mg | ORAL_TABLET | Freq: Every day | ORAL | 3 refills | Status: DC

## 2017-10-14 NOTE — Telephone Encounter (Signed)
New Message  Pt c/o medication issue:  1. Name of Medication: apixaban (ELIQUIS) 5 MG TABS tablet, furosemide (LASIX) 20 MG tablet, pantoprazole (PROTONIX) 20 MG tablet, metoprolol succinate (TOPROL-XL) 50 MG 24 hr tablet, clopidogrel (PLAVIX) 75 MG tablet, nad atorvastatin (LIPITOR) 40 MG tablet  2. How are you currently taking this medication (dosage and times per day)?   3. Are you having a reaction (difficulty breathing--STAT)? no  4. What is your medication issue? Pt states that him and hs wife have an appt on 5/17 and want to know if they will still be prescribed these medications and if so will they have enough until the appt. Please call

## 2017-10-14 NOTE — Telephone Encounter (Signed)
OV 2/21 with Arnold Long DNP  Rx sent to pharmacy, pt aware

## 2017-10-17 ENCOUNTER — Telehealth: Payer: Self-pay | Admitting: Cardiovascular Disease

## 2017-10-17 DIAGNOSIS — R2681 Unsteadiness on feet: Secondary | ICD-10-CM | POA: Diagnosis not present

## 2017-10-17 DIAGNOSIS — I214 Non-ST elevation (NSTEMI) myocardial infarction: Secondary | ICD-10-CM | POA: Diagnosis not present

## 2017-10-17 DIAGNOSIS — I4891 Unspecified atrial fibrillation: Secondary | ICD-10-CM | POA: Diagnosis not present

## 2017-10-17 DIAGNOSIS — M6281 Muscle weakness (generalized): Secondary | ICD-10-CM | POA: Diagnosis not present

## 2017-10-17 NOTE — Telephone Encounter (Signed)
Small bruises are expected with plavix and eliquis combination but will need MD recommendation to adjust plavix if needed.   No need to stop eliquis for now. Please keep nares moist with saline solution or saline gel to prevent bleeding. May use Afrin to control nose bleeds as well.

## 2017-10-17 NOTE — Telephone Encounter (Signed)
Pt aware some bruising is normal will continue with meds (plavix and eliquis) and will try saline to nares for nose bleeds will call if no improvement .Will forward to Dr Sallyanne Kuster for review .[name]

## 2017-10-17 NOTE — Telephone Encounter (Signed)
Please stop clopidogrel, continue Eliquis. Let her know that the full effect of clopidogrel will not resolve for 5 days after last dose. We were always planning to keep her on both agents only temporarily. MCr

## 2017-10-17 NOTE — Telephone Encounter (Signed)
Pt aware of recommendations ./cy 

## 2017-10-17 NOTE — Progress Notes (Signed)
[facility]  [phone] [phone]     ID: [name] DOB: 1922/05/13  MR#: [id number]  ATF#:[id number]  Patient Care Team: Marletta Lor, MD as PCP - General Erroll Luna, MD as Consulting Physician (General Surgery) Magrinat, Virgie Dad, MD as Consulting Physician (Oncology) Arloa Koh, MD as Consulting Physician (Radiation Oncology) [name], RN as Registered Nurse [name], RN as Registered Nurse [name], NP as Nurse Practitioner (Nurse Practitioner) [name], MD as Consulting Physician (Gastroenterology) OTHER MD:  CHIEF COMPLAINT: ductal carcinoma in situ, right breast; remote invasive cancer left breast  CURRENT TREATMENT: observation   BREAST CANCER HISTORY: From the earlier summary note:  I saw Cyd previously for a history of bilateral breast cancers, treated with bilateral lumpectomies and anti-estrogens for 5 years. She did not receive radiation treatments. She was last seen here in 2011.  She had her annual screening mammography at [facility] 08/27[facility]2013, and this showed the breast density to be category B. There was an area of new grouped heterogeneous calcifications in the right breast at the 11:00 middle depth. A focal asymmetric density anterior to that area was seen in one view only. Additional views were recommended, but if they were performed I do not have those records. There appears to have been mammography on 03/05/2014, but again I do not have those records.  On 08/30/2014, bilateral diagnostic mammography with tomosynthesis this was obtained. The calcifications at the 11:00 position in the right breast were increased in number. There were no other significant findings. Biopsy of the area in question to 20 11/06/2014 showed ([id number]) high-grade ductal carcinoma in situ, with mucin extravasation.no definitive invasive component was seen however. This high-grade noninvasive tumor was  estrogen receptor positive at 100%, progesterone receptor positive at 44%, both with strong staining intensity.  Her subsequent history is as detailed below  INTERVAL HISTORY: [name] returns today for for follow-up of her bilateral breast cancers accompanied by her husband. She had bilateral diagnostic mammography on 10/18/2017 at [facility] showing breast density category B. There was no evidence of malignancy.   She underwent a laparoscopic cholecystectomy on 08/13/2017 due to acute and chronic cholecystitis.   Since her last visit, she completed a chest CT on 08/26/2017 showing: No demonstrable pulmonary embolus. No thoracic aortic aneurysm. There is extensive aortic and great vessel calcification as well as foci of coronary artery calcification. No dissection evident; note that the contrast bolus is not sufficient to exclude this entity from an imaging standpoint. Pleural effusions bilaterally with compressive atelectasis in both lung bases. There may be superimposed pneumonia in the lung bases. There is bibasilar interstitial edema. Mildly enlarged pericarinal region lymph nodes. Small pericardial effusion. 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 Aortic Atherosclerosis (ICD10-I70.0).  She also completed a chest x-ray on 08/29/2017 showing: Interval slight decrease in degree of pulmonary vascular congestion. CT noted pleural effusions and basilar atelectasis not as well delineated on present plain film exam. Cardiomegaly. Aortic Atherosclerosis (ICD10-I70.0).  REVIEW OF SYSTEMS: [name] reports that she was in rehabilitation for A Fib, but she is now independent again. She denies having falls. She is eating well now, but previously she lost weight due to having medical issues while in rehab. She is able to do physical therapy, and she is trying to improve her walking and balance. She is using a cane in  order to prevent falls. She denies  unusual headaches, visual changes, nausea, vomiting, or dizziness. There has been no unusual cough, phlegm production, or pleurisy. This been no change in bowel or bladder habits. She denies unexplained fatigue or unexplained weight loss, bleeding, rash, or fever. A detailed review of systems was otherwise stable.    PAST MEDICAL HISTORY: Past Medical History:  Diagnosis Date  . Allergic rhinitis, seasonal   . Breast cancer of upper-outer quadrant of right female breast ([place]) 09/13/2014  . Chronic venous insufficiency   . Dermatophytosis of nail   . Edema leg   . Family history of malignant neoplasm of breast   . Family history of malignant neoplasm of ovary   . Goiter    right thyroid nodule   . HTN (hypertension) 08/13/2017  . HX: breast cancer    bilateral  . Menopausal syndrome   . Osteoarthritis   . Osteoporosis   . Squamous cell skin cancer    right lower leg  . Wears glasses   . Wears hearing aid    both ears    PAST SURGICAL HISTORY: Past Surgical History:  Procedure Laterality Date  . bilateral lumpectomies for breast cancer  Aug. 2007  . BREAST LUMPECTOMY WITH RADIOACTIVE SEED LOCALIZATION Right 10/01/2014   Procedure: BREAST LUMPECTOMY WITH RADIOACTIVE SEED LOCALIZATION;  Surgeon: Erroll Luna, MD;  Location: [facility];  Service: General;  Laterality: Right;  . CATARACT EXTRACTION    . CHOLECYSTECTOMY N/A 08/13/2017   Procedure: LAPAROSCOPIC CHOLECYSTECTOMY WITH INTRAOPERATIVE CHOLANGIOGRAM;  Surgeon: Excell Seltzer, MD;  Location: [facility];  Service: General;  Laterality: N/A;  . DILATION AND CURETTAGE OF UTERUS    . ESOPHAGOGASTRODUODENOSCOPY  07/26/2011   Procedure: ESOPHAGOGASTRODUODENOSCOPY (EGD);  Surgeon: Jeryl Columbia, MD;  Location: [facility];  Service: Endoscopy;  Laterality: N/A;  . history of lumbar compression fracture    . left eardum sx  1983  . left knee arthroscopic surgery    . no screening  colonoscopy    . s/p bilateral lumpectomies    . SAVORY DILATION  07/26/2011   Procedure: SAVORY DILATION;  Surgeon: Jeryl Columbia, MD;  Location: [facility];  Service: Endoscopy;  Laterality: N/A;  . status post resection squamous cell cancer right lower leg    . TONSILLECTOMY    . UMBILICAL HERNIA REPAIR      FAMILY HISTORY Family History  Problem Relation Age of Onset  . Cancer Mother 39       breast  . Heart attack Father   . Heart attack Brother   . Heart attack Brother   . Cancer Maternal Aunt 55       breast  . Coronary artery disease Other   . Cancer Other        ovarian; daughter of mat aunt w/ BC in 59s  . Heart disease Brother   . Cancer Maternal Aunt 58       breast  the patient's father died at age 75 from a heart attack. The patient's mother died at age 54. The patient's mother was one of 39 sisters, 9 siblings. 2 of the sisters had breast cancer, postmenopausal. Mirjana Tarleton self had 2 half-brothers and 1 full brother as well as one half-sister.There is no history of ovarian cancer in the familyexcept as noted.  GYNECOLOGIC HISTORY:  No LMP recorded. Patient is postmenopausal. Menarche age 89, the patient is GX P0. 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 Her husband [name] is a former Programme researcher, broadcasting/film/video. They currently reside at friends home [place], with no pets.    ADVANCED DIRECTIVES: in place   HEALTH MAINTENANCE: Social History   Tobacco Use  . Smoking status: Never Smoker  . Smokeless tobacco: Never Used  Substance Use Topics  . Alcohol use: No  . Drug use: No     Colonoscopy:  PAP:  Bone density:  Lipid panel:  Allergies  Allergen Reactions  . Celecoxib Swelling    REACTION: Swelling-leg    Current Outpatient Medications  Medication Sig Dispense Refill  . acetaminophen (TYLENOL) 325 MG tablet  Take 650 mg by mouth every 4 (four) hours as needed for mild pain or headache.    Marland Kitchen apixaban (ELIQUIS) 5 MG TABS tablet Take 1 tablet (5 mg total) by mouth 2 (two) times daily. 180 tablet 3  . atorvastatin (LIPITOR) 40 MG tablet Take 1 tablet (40 mg total) by mouth daily. 90 tablet 3  . Calcium Carb-Cholecalciferol (CALCIUM 600 + D PO) Take 1 tablet by mouth daily.     . fluticasone (FLONASE) 50 MCG/ACT nasal spray Place 2 sprays into both nostrils daily. 16 g 6  . furosemide (LASIX) 20 MG tablet Take 1 tablet (20 mg total) by mouth daily. 90 tablet 3  . ketoconazole (NIZORAL) 2 % shampoo Apply 1 application topically every 7 (seven) days.     Marland Kitchen loratadine (CLARITIN) 10 MG tablet Take 10 mg by mouth daily.    . metoprolol succinate (TOPROL-XL) 50 MG 24 hr tablet Take 1 tablet (50 mg total) by mouth at bedtime. Take with or immediately following a meal. 90 tablet 3  . Multiple Vitamins-Minerals (PRESERVISION AREDS 2) CAPS Take 2 capsules by mouth 2 (two) times daily.     . pantoprazole (PROTONIX) 20 MG tablet Take 1 tablet (20 mg total) by mouth daily. 90 tablet 5   No current facility-administered medications for this visit.     OBJECTIVE: elderly white woman in no acute distress  Vitals:   10/19/17 1409  BP: (!) 134/49  Pulse: 84  Resp: 17  Temp: 98.2 F (36.8 C)  SpO2: 99%     Body mass index is 29.8 kg/m.    ECOG FS:1 - Symptomatic but completely ambulatory  Sclerae unicteric, EOMs intact No cervical or supraclavicular adenopathy Lungs no rales or rhonchi Heart regular rate and rhythm Abd soft, nontender, positive bowel sounds MSK no focal spinal tenderness, no upper extremity lymphedema, walks with a cane Neuro: nonfocal, well oriented, appropriate affect Breasts: She has undergone bilateral lumpectomies.  There is no findings suggestive of recurrent disease.  Both axillae are benign.  LAB RESULTS:  CMP     Component Value Date/Time   NA 145 09/19/2017   NA 145  09/18/2014 1245   K 3.7 09/19/2017   K 3.9 09/18/2014 1245   CL 102 08/29/2017 0629   CO2 24 08/29/2017 0629   CO2 25 09/18/2014 1245   GLUCOSE 129 (H) 08/29/2017 0629   GLUCOSE 107 09/18/2014 1245   BUN 16 09/19/2017   BUN 20.1 09/18/2014 1245   CREATININE 0.97 09/19/2017   CREATININE 1.2 (H) 09/18/2014 1245   CALCIUM 7.3 09/19/2017   CALCIUM 9.1 09/18/2014 1245   PROT 5.1 09/19/2017   PROT 6.8 09/18/2014 1245   ALBUMIN 2.8 09/19/2017   ALBUMIN 3.8 09/18/2014 1245   AST 18 09/19/2017   AST 14 09/18/2014 1245  ALT 23 09/19/2017   ALT 8 09/18/2014 1245   ALKPHOS 87 09/19/2017   ALKPHOS 100 09/18/2014 1245   BILITOT 0.5 09/19/2017   BILITOT 0.49 09/18/2014 1245   GFRNONAA 34 (L) 08/29/2017 0629   GFRAA 39 (L) 08/29/2017 0629    INo results found for: SPEP, UPEP  Lab Results  Component Value Date   WBC 5.6 10/19/2017   NEUTROABS 3.6 10/19/2017   HGB 10.4 (L) 10/19/2017   HCT 32.4 (L) 10/19/2017   MCV 93.4 10/19/2017   PLT 184 10/19/2017      Chemistry      Component Value Date/Time   NA 145 09/19/2017   NA 145 09/18/2014 1245   K 3.7 09/19/2017   K 3.9 09/18/2014 1245   CL 102 08/29/2017 0629   CO2 24 08/29/2017 0629   CO2 25 09/18/2014 1245   BUN 16 09/19/2017   BUN 20.1 09/18/2014 1245   CREATININE 0.97 09/19/2017   CREATININE 1.2 (H) 09/18/2014 1245      Component Value Date/Time   CALCIUM 7.3 09/19/2017   CALCIUM 9.1 09/18/2014 1245   ALKPHOS 87 09/19/2017   ALKPHOS 100 09/18/2014 1245   AST 18 09/19/2017   AST 14 09/18/2014 1245   ALT 23 09/19/2017   ALT 8 09/18/2014 1245   BILITOT 0.5 09/19/2017   BILITOT 0.49 09/18/2014 1245       Lab Results  Component Value Date   LABCA2 34 05/20/2011    No components found for: [id number]  No results for input(s): INR in the last 168 hours.  Urinalysis    Component Value Date/Time   COLORURINE YELLOW 08/12/2017 0627   APPEARANCEUR CLEAR 08/12/2017 0627   LABSPEC 1.014 08/12/2017 0627    PHURINE 6.0 08/12/2017 0627   GLUCOSEU NEGATIVE 08/12/2017 0627   HGBUR SMALL (A) 08/12/2017 0627   HGBUR trace-intact 10/28/2009 0905   BILIRUBINUR NEGATIVE 08/12/2017 0627   KETONESUR NEGATIVE 08/12/2017 0627   PROTEINUR NEGATIVE 08/12/2017 0627   UROBILINOGEN 0.2 10/28/2009 0905   NITRITE NEGATIVE 08/12/2017 0627   LEUKOCYTESUR TRACE (A) 08/12/2017 0627    STUDIES: No results found.  She had bilateral diagnostic mammography on 10/18/2017 at [facility] showing breast density category B. There was no evidence of malignancy.   Since her last visit, she completed a chest CT on 08/26/2017 showing: No demonstrable pulmonary embolus. No thoracic aortic aneurysm. There is extensive aortic and great vessel calcification as well as foci of coronary artery calcification. No dissection evident; note that the contrast bolus is not sufficient to exclude this entity from an imaging standpoint. Pleural effusions bilaterally with compressive atelectasis in both lung bases. There may be superimposed pneumonia in the lung bases. There is bibasilar interstitial edema. Mildly enlarged pericarinal region lymph nodes. Small pericardial effusion. 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 Aortic Atherosclerosis (ICD10-I70.0).  She also completed a chest x-ray on 08/29/2017 showing: Interval slight decrease in degree of pulmonary vascular congestion. CT noted pleural effusions and basilar atelectasis not as well delineated on present plain film exam. Cardiomegaly. Aortic Atherosclerosis (ICD10-I70.0).   ASSESSMENT: 84 y.o. BRCA negative [place] resident   (1) status post bilateral lumpectomies without sentinel lymph node biopsy in August 2007 for a right-sided 7 mm, grade 2, invasive ductal carcinoma, and a left-sided 8 mm, grade 1, invasive ductal carcinoma.  Both tumors were ER positive, HER-2/neu negative,  both with a low proliferation fraction.   (  a) On Arimidex from September 2007 until May 2009, at which time we switched to tamoxifen primarily secondary to concerns regarding osteoporosis, completing five years of antiestrogen September 2011.   (b)  Also received Zometa yearly, discontinued after 4 doses in 2011  (2)  Status post right breast upper outer quadrant biopsy 09/11/2014 4 a 4.2 cm area of ductal carcinoma in situ, grade 2 or 3,  with extravasated mucin, estrogen and progesterone receptor positive   (3) status post right lumpectomy 10/01/2014 for a pT1a pNX, stage IA invasive ductal carcinoma, grade 2, estrogen  and progesterone receptor positive, HER-2 negative, with an MIB-1 of 38%. Margins were close but negative   (4) she did not need adjuvant radiation   (5) opted against anti-estrogens  (a)  DEXA scan 10/31/2014 shows a T score of -1.8.  (6)  the BreastNext gene panel performed at [facility] 09/19/2014 showed no deleterious mutations in ATM, BARD1, BRCA1, BRCA2, BRIP1, CDH1, CHEK2, MRE11A, MUTYH, NBN, NF1, PALB2, PTEN, RAD50, RAD51C, RAD51D, or TP53.  PLAN: Myndi is now 3 years out from her most recent surgery for breast cancer with no evidence of disease recurrence.  This is very favorable.  She managed to get through her cholecystectomy, and she also survived her atrial fibrillation episode.  Today her heart rate was stable.  They are paying almost $400 a month for the apixaban.  They wonder if there is a cheaper alternative.  As far as I can tell this varies from insurance to insurance and I wrote them out the question that the need to call their insurance company to ask namely what else to they have on their formulary that is similar to apixaban and that might be cheaper.  They are aware that warfarin is cheaper but they do not want to go that route.  She knows she is at risk of falls and I have encouraged her to continue to use her cane.  Breast cancer wise she is  doing great.  She will see me again in 1 year.  She knows to call for any problems that may develop before the next visit.   Magrinat, Virgie Dad, MD  10/19/17 2:20 PM Medical Oncology and Hematology [facility] [address] [place], [place] [address] Tel. [phone]    Fax. [phone]  This document serves as a record of services personally performed by Lurline Del, MD. It was created on his behalf by Sheron Nightingale, a trained medical scribe. The creation of this record is based on the scribe's personal observations and the provider's statements to them.   I have reviewed the above documentation for accuracy and completeness, and I agree with the above.

## 2017-10-17 NOTE — Telephone Encounter (Signed)
Pt c/o medication issue:  1. Name of Medication: Plavix 75 mg and  Eliquis 5 mg 2. How are you currently taking this medication (dosage and times per day)?  Takes both medications 1x daily   3. Are you having a reaction (difficulty breathing--STAT)? no  4. What is your medication issue? Patient states that she is having some slight bleeding from left nostril and this started yesterday. Patient also states that she has blue spots on left leg and right arm.

## 2017-10-18 ENCOUNTER — Encounter: Payer: Self-pay | Admitting: Oncology

## 2017-10-18 DIAGNOSIS — Z853 Personal history of malignant neoplasm of breast: Secondary | ICD-10-CM | POA: Diagnosis not present

## 2017-10-18 DIAGNOSIS — R928 Other abnormal and inconclusive findings on diagnostic imaging of breast: Secondary | ICD-10-CM | POA: Diagnosis not present

## 2017-10-19 ENCOUNTER — Inpatient Hospital Stay: Payer: MEDICARE | Attending: Oncology | Admitting: Oncology

## 2017-10-19 ENCOUNTER — Inpatient Hospital Stay: Payer: MEDICARE

## 2017-10-19 ENCOUNTER — Telehealth: Payer: Self-pay | Admitting: Oncology

## 2017-10-19 VITALS — BP 134/49 | HR 84 | Temp 98.2°F | Resp 17 | Ht 63.0 in | Wt 168.2 lb

## 2017-10-19 DIAGNOSIS — D0511 Intraductal carcinoma in situ of right breast: Secondary | ICD-10-CM | POA: Diagnosis not present

## 2017-10-19 DIAGNOSIS — I700 Atherosclerosis of aorta: Secondary | ICD-10-CM

## 2017-10-19 DIAGNOSIS — K810 Acute cholecystitis: Secondary | ICD-10-CM

## 2017-10-19 DIAGNOSIS — Z170 Estrogen receptor positive status [ER+]: Secondary | ICD-10-CM | POA: Diagnosis not present

## 2017-10-19 DIAGNOSIS — I214 Non-ST elevation (NSTEMI) myocardial infarction: Secondary | ICD-10-CM | POA: Diagnosis not present

## 2017-10-19 DIAGNOSIS — I480 Paroxysmal atrial fibrillation: Secondary | ICD-10-CM | POA: Diagnosis not present

## 2017-10-19 DIAGNOSIS — I4891 Unspecified atrial fibrillation: Secondary | ICD-10-CM | POA: Diagnosis not present

## 2017-10-19 DIAGNOSIS — C50411 Malignant neoplasm of upper-outer quadrant of right female breast: Secondary | ICD-10-CM | POA: Diagnosis not present

## 2017-10-19 DIAGNOSIS — M6281 Muscle weakness (generalized): Secondary | ICD-10-CM | POA: Diagnosis not present

## 2017-10-19 DIAGNOSIS — R2681 Unsteadiness on feet: Secondary | ICD-10-CM | POA: Diagnosis not present

## 2017-10-19 LAB — COMPREHENSIVE METABOLIC PANEL
ALBUMIN: 3.1 g/dL — AB (ref 3.5–5.0)
ALT: 10 U/L (ref 0–55)
ANION GAP: 10 (ref 3–11)
AST: 16 U/L (ref 5–34)
Alkaline Phosphatase: 109 U/L (ref 40–150)
BUN: 20 mg/dL (ref 7–26)
CHLORIDE: 108 mmol/L (ref 98–109)
CO2: 26 mmol/L (ref 22–29)
Calcium: 7.2 mg/dL — ABNORMAL LOW (ref 8.4–10.4)
Creatinine, Ser: 0.94 mg/dL (ref 0.60–1.10)
GFR calc Af Amer: 59 mL/min — ABNORMAL LOW (ref >60)
GFR calc non Af Amer: 51 mL/min — ABNORMAL LOW (ref >60)
GLUCOSE: 129 mg/dL (ref 70–140)
POTASSIUM: 3.9 mmol/L (ref 3.5–5.1)
Sodium: 144 mmol/L (ref 136–145)
Total Bilirubin: 0.5 mg/dL (ref 0.2–1.2)
Total Protein: 6.3 g/dL — ABNORMAL LOW (ref 6.4–8.3)

## 2017-10-19 LAB — CBC WITH DIFFERENTIAL/PLATELET
Basophils Absolute: 0 10*3/uL (ref 0.0–0.1)
Basophils Relative: 1 %
Eosinophils Absolute: 0.1 10*3/uL (ref 0.0–0.5)
Eosinophils Relative: 2 %
HEMATOCRIT: 32.4 % — AB (ref 34.8–46.6)
HEMOGLOBIN: 10.4 g/dL — AB (ref 11.6–15.9)
LYMPHS PCT: 26 %
Lymphs Abs: 1.5 10*3/uL (ref 0.9–3.3)
MCH: 30 pg (ref 25.1–34.0)
MCHC: 32.1 g/dL (ref 31.5–36.0)
MCV: 93.4 fL (ref 79.5–101.0)
MONO ABS: 0.4 10*3/uL (ref 0.1–0.9)
MONOS PCT: 7 %
NEUTROS ABS: 3.6 10*3/uL (ref 1.5–6.5)
Neutrophils Relative %: 64 %
Platelets: 184 10*3/uL (ref 145–400)
RBC: 3.47 MIL/uL — ABNORMAL LOW (ref 3.70–5.45)
RDW: 16 % — AB (ref 11.2–14.5)
WBC: 5.6 10*3/uL (ref 3.9–10.3)

## 2017-10-19 NOTE — Telephone Encounter (Signed)
Gave patient AVS and calendar of upcoming April 2020 appointments.  °

## 2017-10-20 DIAGNOSIS — I214 Non-ST elevation (NSTEMI) myocardial infarction: Secondary | ICD-10-CM | POA: Diagnosis not present

## 2017-10-20 DIAGNOSIS — I4891 Unspecified atrial fibrillation: Secondary | ICD-10-CM | POA: Diagnosis not present

## 2017-10-20 DIAGNOSIS — R2681 Unsteadiness on feet: Secondary | ICD-10-CM | POA: Diagnosis not present

## 2017-10-20 DIAGNOSIS — M6281 Muscle weakness (generalized): Secondary | ICD-10-CM | POA: Diagnosis not present

## 2017-10-24 DIAGNOSIS — R2681 Unsteadiness on feet: Secondary | ICD-10-CM | POA: Diagnosis not present

## 2017-10-24 DIAGNOSIS — M6281 Muscle weakness (generalized): Secondary | ICD-10-CM | POA: Diagnosis not present

## 2017-10-24 DIAGNOSIS — I214 Non-ST elevation (NSTEMI) myocardial infarction: Secondary | ICD-10-CM | POA: Diagnosis not present

## 2017-10-24 DIAGNOSIS — I4891 Unspecified atrial fibrillation: Secondary | ICD-10-CM | POA: Diagnosis not present

## 2017-10-25 DIAGNOSIS — M6281 Muscle weakness (generalized): Secondary | ICD-10-CM | POA: Diagnosis not present

## 2017-10-25 DIAGNOSIS — I214 Non-ST elevation (NSTEMI) myocardial infarction: Secondary | ICD-10-CM | POA: Diagnosis not present

## 2017-10-25 DIAGNOSIS — R2681 Unsteadiness on feet: Secondary | ICD-10-CM | POA: Diagnosis not present

## 2017-10-25 DIAGNOSIS — I4891 Unspecified atrial fibrillation: Secondary | ICD-10-CM | POA: Diagnosis not present

## 2017-10-27 DIAGNOSIS — I214 Non-ST elevation (NSTEMI) myocardial infarction: Secondary | ICD-10-CM | POA: Diagnosis not present

## 2017-10-27 DIAGNOSIS — I4891 Unspecified atrial fibrillation: Secondary | ICD-10-CM | POA: Diagnosis not present

## 2017-10-27 DIAGNOSIS — R2681 Unsteadiness on feet: Secondary | ICD-10-CM | POA: Diagnosis not present

## 2017-10-27 DIAGNOSIS — M6281 Muscle weakness (generalized): Secondary | ICD-10-CM | POA: Diagnosis not present

## 2017-10-31 DIAGNOSIS — M6281 Muscle weakness (generalized): Secondary | ICD-10-CM | POA: Diagnosis not present

## 2017-10-31 DIAGNOSIS — R2681 Unsteadiness on feet: Secondary | ICD-10-CM | POA: Diagnosis not present

## 2017-10-31 DIAGNOSIS — I4891 Unspecified atrial fibrillation: Secondary | ICD-10-CM | POA: Diagnosis not present

## 2017-10-31 DIAGNOSIS — I214 Non-ST elevation (NSTEMI) myocardial infarction: Secondary | ICD-10-CM | POA: Diagnosis not present

## 2017-11-01 DIAGNOSIS — M6281 Muscle weakness (generalized): Secondary | ICD-10-CM | POA: Diagnosis not present

## 2017-11-01 DIAGNOSIS — R2681 Unsteadiness on feet: Secondary | ICD-10-CM | POA: Diagnosis not present

## 2017-11-01 DIAGNOSIS — I4891 Unspecified atrial fibrillation: Secondary | ICD-10-CM | POA: Diagnosis not present

## 2017-11-01 DIAGNOSIS — I214 Non-ST elevation (NSTEMI) myocardial infarction: Secondary | ICD-10-CM | POA: Diagnosis not present

## 2017-11-02 ENCOUNTER — Telehealth: Payer: Self-pay | Admitting: Cardiovascular Disease

## 2017-11-02 MED ORDER — FUROSEMIDE 20 MG PO TABS: 20.0000 mg | ORAL_TABLET | Freq: Every day | ORAL | 3 refills | Status: DC

## 2017-11-02 MED ORDER — ATORVASTATIN CALCIUM 40 MG PO TABS: 40.0000 mg | ORAL_TABLET | Freq: Every day | ORAL | 3 refills | Status: DC

## 2017-11-02 MED ORDER — METOPROLOL SUCCINATE ER 50 MG PO TB24: 50.0000 mg | ORAL_TABLET | Freq: Every day | ORAL | 3 refills | Status: DC

## 2017-11-02 MED ORDER — APIXABAN 5 MG PO TABS: 5.0000 mg | ORAL_TABLET | Freq: Two times a day (BID) | ORAL | 1 refills | Status: DC

## 2017-11-02 NOTE — Telephone Encounter (Signed)
New Message:      *STAT* If patient is at the pharmacy, call can be transferred to refill team.   1. Which medications need to be refilled? (please list name of each medication and dose if known)  apixaban (ELIQUIS) 5 MG TABS tablet atorvastatin (LIPITOR) 40 MG tablet furosemide (LASIX) 20 MG tablet metoprolol succinate (TOPROL-XL) 50 MG 24 hr tablet 2. Which pharmacy/location (including street and city if local pharmacy) is medication to be sent to? [id number] - [place], [place] - [address]. Do they need a 30 day or 90 day supply? 30   Pt has an appt on 5/17 but pt states she will run out before then.

## 2017-11-03 DIAGNOSIS — R2681 Unsteadiness on feet: Secondary | ICD-10-CM | POA: Diagnosis not present

## 2017-11-03 DIAGNOSIS — I214 Non-ST elevation (NSTEMI) myocardial infarction: Secondary | ICD-10-CM | POA: Diagnosis not present

## 2017-11-03 DIAGNOSIS — I4891 Unspecified atrial fibrillation: Secondary | ICD-10-CM | POA: Diagnosis not present

## 2017-11-03 DIAGNOSIS — M6281 Muscle weakness (generalized): Secondary | ICD-10-CM | POA: Diagnosis not present

## 2017-11-15 ENCOUNTER — Telehealth: Payer: Self-pay

## 2017-11-15 ENCOUNTER — Other Ambulatory Visit: Payer: Self-pay

## 2017-11-15 ENCOUNTER — Ambulatory Visit (INDEPENDENT_AMBULATORY_CARE_PROVIDER_SITE_OTHER): Payer: MEDICARE | Admitting: Allergy and Immunology

## 2017-11-15 ENCOUNTER — Encounter: Payer: Self-pay | Admitting: Allergy and Immunology

## 2017-11-15 VITALS — BP 120/56 | HR 80 | Resp 24 | Ht 61.42 in | Wt 169.4 lb

## 2017-11-15 DIAGNOSIS — H00015 Hordeolum externum left lower eyelid: Secondary | ICD-10-CM | POA: Diagnosis not present

## 2017-11-15 DIAGNOSIS — J3089 Other allergic rhinitis: Secondary | ICD-10-CM | POA: Diagnosis not present

## 2017-11-15 MED ORDER — MONTELUKAST SODIUM 10 MG PO TABS: 10.0000 mg | ORAL_TABLET | Freq: Every day | ORAL | 5 refills | Status: DC

## 2017-11-15 MED ORDER — IPRATROPIUM BROMIDE 0.06 % NA SOLN: 2.0000 | Freq: Four times a day (QID) | NASAL | 5 refills | Status: DC

## 2017-11-15 MED ORDER — AZITHROMYCIN 1 % OP SOLN: OPHTHALMIC | 0 refills | Status: DC

## 2017-11-15 NOTE — Telephone Encounter (Signed)
Pharmacy states that Besifloxicin is covered by [employer]. Verbal order from Dr. Neldon Mc to change med to Besifloxicin 1 drop in each eye twice daily for 5 days. Verbal order given to pharmacy.

## 2017-11-15 NOTE — Telephone Encounter (Signed)
PTS [employer] will not cover azasite eye drops 1% is it ok to change to olopatadine

## 2017-11-15 NOTE — Progress Notes (Signed)
Dear Dr. Burnice Logan,  Thank you for referring [name] to the [facility] [place] on 11/15/2017.   Below is a summation of this patient's evaluation and recommendations.  Thank you for your referral. I will keep you informed about this patient's response to treatment.   If you have any questions please do not hesitate to contact me.   Sincerely,  Jiles Prows, MD Allergy / [place]   ______________________________________________________________________    NEW PATIENT NOTE  Referring Provider: Marletta Lor, MD Primary Provider: Marletta Lor, MD Date of office visit: 11/15/2017    Subjective:   Chief Complaint:  [name] (DOB: [birth year]) is a 84 y.o. female who presents to the clinic on 11/15/2017 with a chief complaint of Sinus Problem (constant runny nose, some stuffiness, sneezing, PND) .     HPI: [name] presents to this clinic in evaluation of problems with her nose.  She appears to have a very long history of sneezing and runny nose especially if she is exposed to dust and locating to the outdoors.  In addition, she has lost her ability to smell over many years.  She can taste without any difficulty.  She does not have a history of ugly nasal discharge.  She carries a tissue all day because her nose runs.  She has been using Flonase on a daily basis for years and uses antihistamines as well yet still remains symptomatic.  Past Medical History:  Diagnosis Date  . Allergic rhinitis, seasonal   . Breast cancer of upper-outer quadrant of right female breast ([place]) 09/13/2014  . Chronic venous insufficiency   . Dermatophytosis of nail   . Edema leg   . Family history of malignant neoplasm of breast   . Family history of malignant neoplasm of ovary   . Goiter    right thyroid nodule   . HTN (hypertension) 08/13/2017  . HX: breast cancer    bilateral  . Menopausal syndrome   . Osteoarthritis   . Osteoporosis   . Squamous cell skin cancer    right lower leg  . Wears glasses   . Wears hearing aid    both ears    Past Surgical History:  Procedure Laterality Date  . bilateral lumpectomies for breast cancer  Aug. 2007  . BREAST LUMPECTOMY WITH RADIOACTIVE SEED LOCALIZATION Right 10/01/2014   Procedure: BREAST LUMPECTOMY WITH RADIOACTIVE SEED LOCALIZATION;  Surgeon: Erroll Luna, MD;  Location: [facility];  Service: General;  Laterality: Right;  . CATARACT EXTRACTION    . CHOLECYSTECTOMY N/A 08/13/2017   Procedure: LAPAROSCOPIC CHOLECYSTECTOMY WITH INTRAOPERATIVE CHOLANGIOGRAM;  Surgeon: Excell Seltzer, MD;  Location: [facility];  Service: General;  Laterality: N/A;  . DILATION AND CURETTAGE OF UTERUS    . ESOPHAGOGASTRODUODENOSCOPY  07/26/2011   Procedure: ESOPHAGOGASTRODUODENOSCOPY (EGD);  Surgeon: Jeryl Columbia, MD;  Location: [facility];  Service: Endoscopy;  Laterality: N/A;  . history of lumbar compression fracture    . left eardum sx  1983  . left knee arthroscopic surgery    . no screening colonoscopy    . s/p bilateral lumpectomies    . SAVORY DILATION  07/26/2011   Procedure: SAVORY DILATION;  Surgeon: Jeryl Columbia, MD;  Location: [facility];  Service: Endoscopy;  Laterality: N/A;  . status post resection squamous cell cancer right lower leg    . TONSILLECTOMY    .  UMBILICAL HERNIA REPAIR      Allergies as of 11/15/2017      Reactions   Celecoxib Swelling   REACTION: Swelling-leg      Medication List      acetaminophen 325 MG tablet Commonly known as:  TYLENOL Take 650 mg by mouth every 4 (four) hours as needed for mild pain or headache.   apixaban 5 MG Tabs tablet Commonly known as:  ELIQUIS Take 1 tablet (5 mg total) by mouth 2 (two) times daily.   atorvastatin 40 MG tablet Commonly known as:  LIPITOR Take 1 tablet (40 mg total) by mouth daily.   CALCIUM 600 + D PO Take 1 tablet  by mouth daily.   fluticasone 50 MCG/ACT nasal spray Commonly known as:  FLONASE Place 2 sprays into both nostrils daily.   furosemide 20 MG tablet Commonly known as:  LASIX Take 1 tablet (20 mg total) by mouth daily.   ketoconazole 2 % shampoo Commonly known as:  NIZORAL Apply 1 application topically every 7 (seven) days.   loratadine 10 MG tablet Commonly known as:  CLARITIN Take 10 mg by mouth daily.   metoprolol succinate 50 MG 24 hr tablet Commonly known as:  TOPROL-XL Take 1 tablet (50 mg total) by mouth at bedtime. Take with or immediately following a meal.   pantoprazole 20 MG tablet Commonly known as:  PROTONIX Take 1 tablet (20 mg total) by mouth daily.   PRESERVISION AREDS 2 Caps Take 2 capsules by mouth 2 (two) times daily.       Review of systems negative except as noted in HPI / PMHx or noted below:  Review of Systems  Constitutional: Negative.   HENT: Negative.   Eyes: Negative.   Respiratory: Negative.   Cardiovascular: Negative.   Gastrointestinal: Negative.   Genitourinary: Negative.   Musculoskeletal: Negative.   Skin: Negative.   Neurological: Negative.   Endo/Heme/Allergies: Negative.   Psychiatric/Behavioral: Negative.     Family History  Problem Relation Age of Onset  . Cancer Mother 32       breast  . Heart attack Father   . Heart attack Brother   . Heart attack Brother   . Cancer Maternal Aunt 55       breast  . Coronary artery disease Other   . Cancer Other        ovarian; daughter of mat aunt w/ BC in 19s  . Heart disease Brother   . Cancer Maternal Aunt 48       breast    Social History   Socioeconomic History  . Marital status: Married    Spouse name: Not on file  . Number of children: Not on file  . Years of education: Not on file  . Highest education level: Not on file  Occupational History  . Not on file  Social Needs  . Financial resource strain: Not on file  . Food insecurity:    Worry: Not on file     Inability: Not on file  . Transportation needs:    Medical: Not on file    Non-medical: Not on file  Tobacco Use  . Smoking status: Never Smoker  . Smokeless tobacco: Never Used  Substance and Sexual Activity  . Alcohol use: No  . Drug use: No  . Sexual activity: Not on file  Lifestyle  . Physical activity:    Days per week: Not on file    Minutes per session: Not on file  . Stress:  Not on file  Relationships  . Social connections:    Talks on phone: Not on file    Gets together: Not on file    Attends religious service: Not on file    Active member of club or organization: Not on file    Attends meetings of clubs or organizations: Not on file    Relationship status: Not on file  . Intimate partner violence:    Fear of current or ex partner: Not on file    Emotionally abused: Not on file    Physically abused: Not on file    Forced sexual activity: Not on file  Other Topics Concern  . Not on file  Social History Narrative  . Not on file    Environmental and Social history  Lives in a apartment with a dry environment, no animals located inside the household, plastic on the bed, plastic on the pillow, no smokers located inside the household.  Objective:   Vitals:   11/15/17 1415  BP: (!) 120/56  Pulse: 80  Resp: (!) 24   Height: 5' 1.42" (156 cm) Weight: 169 lb 6.4 oz (76.8 kg)  Physical Exam  HENT:  Head: Normocephalic.  Right Ear: External ear normal.  Left Ear: External ear normal.  Nose: Mucosal edema present. No rhinorrhea.  Mouth/Throat: Uvula is midline, oropharynx is clear and moist and mucous membranes are normal. No oropharyngeal exudate.  Bilateral hearing aid  Eyes: Conjunctivae are normal. Left eye exhibits hordeolum (Lower medial lid).  Neck: Trachea normal. No tracheal tenderness present. No tracheal deviation present. No thyromegaly present.  Cardiovascular: Normal rate, regular rhythm, S1 normal, S2 normal and normal heart sounds.  No murmur  (Systolic murmur) heard. Pulmonary/Chest: Breath sounds normal. No stridor. No respiratory distress. She has no wheezes. She has no rales.  Musculoskeletal: She exhibits no edema.  Lymphadenopathy:       Head (right side): No tonsillar adenopathy present.       Head (left side): No tonsillar adenopathy present.    She has no cervical adenopathy.  Neurological: She is alert.  Skin: No rash noted. She is not diaphoretic. No erythema. Nails show no clubbing.    Diagnostics: Allergy skin tests were performed.  She did not demonstrate any hypersensitivity to his screening panel of aeroallergens or foods.  Results of blood tests obtained 19 October 2017 identified normal hepatic and renal function, calcium 7.2 mg/DL, WBC 5.6, eosinophil 100, lymphocyte 1500, hemoglobin 10.4, platelet 184  Results of a chest CT scan obtained 26 August 2017 identified the following:  Cardiovascular: There is no demonstrable pulmonary embolus. There is no thoracic aortic aneurysm. No dissection is evident. It must be cautioned that the contrast bolus is not targeted toward the aorta, in the contrast bolus in the aorta is not felt to be sufficient to exclude possible dissection from an imaging standpoint. There is aortic atherosclerosis. There is calcification in the proximal visualized great vessels as well as foci of coronary artery calcification evident. There is a small pericardial effusion. Pericardium does not appear appreciably thickened. There is mitral annular calcification.  Mediastinum/Nodes: Visualized thyroid appears unremarkable. There is a lymph node to the right of the distal trachea anteriorly measuring 1.7 x 1.5 cm. There is a lymph node to the left of the carina measuring 1.8 x 1.2 cm. There are scattered subcentimeter mediastinal lymph nodes as well. No esophageal lesions are evident.  Lungs/Pleura: There are moderate pleural effusions bilaterally. There is patchy airspace consolidation  in  posterior lung bases, largely due to compressive atelectasis. A degree of superimposed pneumonia in the bases cannot be excluded, however. There is a degree of interstitial edema in the bases as well. There is an azygos lobe on the right, an anatomic variant.   Assessment and Plan:    1. Other allergic rhinitis   2. Hordeolum externum of left lower eyelid     1.  Allergen avoidance measures?  2.  Replace Flonase with OTC Nasacort 1 spray each nostril 1 time per day  3.  Start montelukast 10 mg tablet 1 time per day  4.  Start Azasite -Instill 1 drop in the affected eye twice daily, 8 to 12 hours apart, for the first 2 days and then instill 1 drop in the affected eye once daily for the next 5 days.  5. If needed:   A. OTC Antihistamine - Claritin / Allegra / Zyrtec  B.  Ipratropium 0.06% 2 sprays each nostril every 6 hours to dry up nose  6.  Return to clinic in 4 weeks or earlier if problem  I think that Chella has inflammation of her upper airway and we will treat her with a leukotriene modifier and alter her nasal steroid.  I am somewhat worried that her consistent use of Flonase may have contributed somewhat to her anosmia.  As well, she appears to have a low-grade infection with a hordeolum and we will give her a topical antibiotic.  She has the option of using nasal ipratropium a dry up her nose.  I will regroup with her in 4 weeks or earlier if there is a problem.  Jiles Prows, MD Allergy / [facility]

## 2017-11-15 NOTE — Telephone Encounter (Signed)
She needs a antibiotic eyedrop.  Please see which antibiotic eyedrops are covered by her insurance.

## 2017-11-15 NOTE — Patient Instructions (Addendum)
  1.  Allergen avoidance measures?  2.  Replace Flonase with OTC Nasacort 1 spray each nostril 1 time per day  3.  Start montelukast 10 mg tablet 1 time per day  4.  Start Azasite -Instill 1 drop in the affected eye twice daily, 8 to 12 hours apart, for the first 2 days and then instill 1 drop in the affected eye once daily for the next 5 days.  5. If needed:   A. OTC Antihistamine - Claritin / Allegra / Zyrtec  B.  Ipratropium 0.06% 2 sprays each nostril every 6 hours to dry up nose  6.  Return to clinic in 4 weeks or earlier if problem

## 2017-11-16 ENCOUNTER — Encounter: Payer: Self-pay | Admitting: Allergy and Immunology

## 2017-11-22 ENCOUNTER — Ambulatory Visit (INDEPENDENT_AMBULATORY_CARE_PROVIDER_SITE_OTHER): Payer: MEDICARE | Admitting: Podiatry

## 2017-11-22 ENCOUNTER — Encounter: Payer: Self-pay | Admitting: Podiatry

## 2017-11-22 DIAGNOSIS — Q828 Other specified congenital malformations of skin: Secondary | ICD-10-CM | POA: Diagnosis not present

## 2017-11-22 DIAGNOSIS — D689 Coagulation defect, unspecified: Secondary | ICD-10-CM | POA: Diagnosis not present

## 2017-11-22 DIAGNOSIS — M79676 Pain in unspecified toe(s): Secondary | ICD-10-CM | POA: Diagnosis not present

## 2017-11-22 DIAGNOSIS — B351 Tinea unguium: Secondary | ICD-10-CM

## 2017-11-22 NOTE — Progress Notes (Signed)
She presents today chief complaint of painful elongated toenails and calluses.  Objective: Vital signs are stable alert oriented x3 pulses remain palpable.  Toenails are long thick yellow dystrophic with mycotic porokeratotic lesions plantar aspect of the forefoot bilaterally are much less diffuse than they were previously noted.  Assessment: Pain in limb secondary onychomycosis porokeratosis.  Plan: Debridement of toenails 1 through 5 bilateral debridement of all reactive hyperkeratoses bilateral.

## 2017-11-30 ENCOUNTER — Encounter: Payer: Self-pay | Admitting: Cardiovascular Disease

## 2017-12-02 ENCOUNTER — Ambulatory Visit (INDEPENDENT_AMBULATORY_CARE_PROVIDER_SITE_OTHER): Payer: MEDICARE | Admitting: Cardiovascular Disease

## 2017-12-02 ENCOUNTER — Encounter: Payer: Self-pay | Admitting: Cardiovascular Disease

## 2017-12-02 VITALS — BP 134/70 | HR 72 | Ht 61.0 in | Wt 166.0 lb

## 2017-12-02 DIAGNOSIS — E7800 Pure hypercholesterolemia, unspecified: Secondary | ICD-10-CM

## 2017-12-02 DIAGNOSIS — I5032 Chronic diastolic (congestive) heart failure: Secondary | ICD-10-CM | POA: Diagnosis not present

## 2017-12-02 DIAGNOSIS — I2510 Atherosclerotic heart disease of native coronary artery without angina pectoris: Secondary | ICD-10-CM | POA: Diagnosis not present

## 2017-12-02 DIAGNOSIS — E785 Hyperlipidemia, unspecified: Secondary | ICD-10-CM | POA: Insufficient documentation

## 2017-12-02 DIAGNOSIS — I10 Essential (primary) hypertension: Secondary | ICD-10-CM

## 2017-12-02 DIAGNOSIS — I480 Paroxysmal atrial fibrillation: Secondary | ICD-10-CM

## 2017-12-02 DIAGNOSIS — I25119 Atherosclerotic heart disease of native coronary artery with unspecified angina pectoris: Secondary | ICD-10-CM | POA: Insufficient documentation

## 2017-12-02 DIAGNOSIS — I248 Other forms of acute ischemic heart disease: Secondary | ICD-10-CM

## 2017-12-02 MED ORDER — APIXABAN 5 MG PO TABS: 5.0000 mg | ORAL_TABLET | Freq: Two times a day (BID) | ORAL | 3 refills | Status: DC

## 2017-12-02 MED ORDER — ATORVASTATIN CALCIUM 40 MG PO TABS: 40.0000 mg | ORAL_TABLET | Freq: Every day | ORAL | 3 refills | Status: DC

## 2017-12-02 MED ORDER — METOPROLOL SUCCINATE ER 50 MG PO TB24: 50.0000 mg | ORAL_TABLET | Freq: Every day | ORAL | 3 refills | Status: DC

## 2017-12-02 NOTE — Progress Notes (Signed)
Cardiology Office Note:    Date:  12/02/2017   ID:  [name], DOB 1922-02-13, MRN [id number]  PCP:  Marletta Lor, MD  Cardiologist:  No primary care provider on file.   Referring MD: Marletta Lor, MD   Chief Complaint  Patient presents with  . Follow-up    3 months  Atrial fibrillation, non-STEMI, congestive heart failure  History of Present Illness:    [name] is a 84 y.o. female with a hx of paroxysmal atrial fibrillation complicated by small non-STEMI in brief congestive heart failure, following cholecystectomy in January 2019.  The arrhythmia was initially asymptomatic and detected incidentally during follow-up for her cholecystectomy, but she had elevated cardiac enzymes peaking at a troponin of about 7.3.  When the troponin level was drawn, it was already declining.  Her echo showed apical akinesis but overall left ventricular systolic function was well-preserved.  Her echo was performed while in sinus rhythm and there was evidence of elevated filling pressures.  There were no serious valvular abnormalities but there was moderate mitral insufficiency.  The left atrium was mildly to moderately dilated.  Pulmonary artery pressure was moderately increased.    Her atrial fibrillation spontaneously resolved after hospitalization, but then recurred the next day.  On both occasions she was completely unaware of palpitations.  She did not have angina.  She did have transient dyspnea.  The overall impression was that it was more likely she had demand myocardial injury from tachycardia rather than a true acute coronary event.  The decision was made to pursue conservative management especially in view of her advanced age.  Rate control was easily achieved with ties them, then transition to a beta-blocker.  He has been on anticoagulation with Eliquis without any bleeding problems, but unfortunately she paid a very high price for the medication ($200 for less than 1  month supply).  She cannot afford that long-term.  She was briefly on clopidogrel, but this was discontinued due to the concern of bleeding with simultaneous anticoagulation.  She has been taking a high dose of statin.  Nuys angina or dyspnea but does complain of fatigue and lack of energy, since her hospitalization and possibly attributable to her beta-blocker.Marland Kitchen  She walks with a cane.  She does not have orthopnea or PND.  She has not been troubled by leg edema.  Past Medical History:  Diagnosis Date  . Allergic rhinitis, seasonal   . Breast cancer of upper-outer quadrant of right female breast ([place]) 09/13/2014  . Chronic venous insufficiency   . Dermatophytosis of nail   . Edema leg   . Family history of malignant neoplasm of breast   . Family history of malignant neoplasm of ovary   . Goiter    right thyroid nodule   . HTN (hypertension) 08/13/2017  . HX: breast cancer    bilateral  . Menopausal syndrome   . Osteoarthritis   . Osteoporosis   . Squamous cell skin cancer    right lower leg  . Wears glasses   . Wears hearing aid    both ears    Past Surgical History:  Procedure Laterality Date  . bilateral lumpectomies for breast cancer  Aug. 2007  . BREAST LUMPECTOMY WITH RADIOACTIVE SEED LOCALIZATION Right 10/01/2014   Procedure: BREAST LUMPECTOMY WITH RADIOACTIVE SEED LOCALIZATION;  Surgeon: Erroll Luna, MD;  Location: [facility];  Service: General;  Laterality: Right;  . CATARACT EXTRACTION    . CHOLECYSTECTOMY N/A 08/13/2017  Procedure: LAPAROSCOPIC CHOLECYSTECTOMY WITH INTRAOPERATIVE CHOLANGIOGRAM;  Surgeon: Excell Seltzer, MD;  Location: [facility];  Service: General;  Laterality: N/A;  . DILATION AND CURETTAGE OF UTERUS    . ESOPHAGOGASTRODUODENOSCOPY  07/26/2011   Procedure: ESOPHAGOGASTRODUODENOSCOPY (EGD);  Surgeon: Jeryl Columbia, MD;  Location: [facility];  Service: Endoscopy;  Laterality: N/A;  . history of lumbar compression fracture    . left  eardum sx  1983  . left knee arthroscopic surgery    . no screening colonoscopy    . s/p bilateral lumpectomies    . SAVORY DILATION  07/26/2011   Procedure: SAVORY DILATION;  Surgeon: Jeryl Columbia, MD;  Location: [facility];  Service: Endoscopy;  Laterality: N/A;  . status post resection squamous cell cancer right lower leg    . TONSILLECTOMY    . UMBILICAL HERNIA REPAIR      Current Medications: Current Meds  Medication Sig  . acetaminophen (TYLENOL) 325 MG tablet Take 650 mg by mouth every 4 (four) hours as needed for mild pain or headache.  Marland Kitchen apixaban (ELIQUIS) 5 MG TABS tablet Take 1 tablet (5 mg total) by mouth 2 (two) times daily.  Marland Kitchen atorvastatin (LIPITOR) 40 MG tablet Take 1 tablet (40 mg total) by mouth daily.  Marland Kitchen BESIVANCE 0.6 % SUSP INSTILL 1 DROP 3 TIMES A DAY FOR 5 DAYS  . Calcium Carb-Cholecalciferol (CALCIUM 600 + D PO) Take 1 tablet by mouth daily.   . furosemide (LASIX) 20 MG tablet Take 1 tablet (20 mg total) by mouth daily.  Marland Kitchen ipratropium (ATROVENT) 0.06 % nasal spray Place 2 sprays into both nostrils 4 (four) times daily.  Marland Kitchen ketoconazole (NIZORAL) 2 % shampoo Apply 1 application topically every 7 (seven) days.   Marland Kitchen loratadine (CLARITIN) 10 MG tablet Take 10 mg by mouth daily.  . metoprolol succinate (TOPROL-XL) 50 MG 24 hr tablet Take 1 tablet (50 mg total) by mouth at bedtime. Take with or immediately following a meal.  . montelukast (SINGULAIR) 10 MG tablet Take 1 tablet (10 mg total) by mouth at bedtime.  . Multiple Vitamins-Minerals (PRESERVISION AREDS 2) CAPS Take 2 capsules by mouth 2 (two) times daily.   . pantoprazole (PROTONIX) 20 MG tablet Take 1 tablet (20 mg total) by mouth daily.  . [DISCONTINUED] apixaban (ELIQUIS) 5 MG TABS tablet Take 1 tablet (5 mg total) by mouth 2 (two) times daily.  . [DISCONTINUED] atorvastatin (LIPITOR) 40 MG tablet Take 1 tablet (40 mg total) by mouth daily.  . [DISCONTINUED] fluticasone (FLONASE) 50 MCG/ACT nasal spray Place 2  sprays into both nostrils daily.  . [DISCONTINUED] metoprolol succinate (TOPROL-XL) 50 MG 24 hr tablet Take 1 tablet (50 mg total) by mouth at bedtime. Take with or immediately following a meal.     Allergies:   Celecoxib   Social History   Socioeconomic History  . Marital status: Married    Spouse name: Not on file  . Number of children: Not on file  . Years of education: Not on file  . Highest education level: Not on file  Occupational History  . Not on file  Social Needs  . Financial resource strain: Not on file  . Food insecurity:    Worry: Not on file    Inability: Not on file  . Transportation needs:    Medical: Not on file    Non-medical: Not on file  Tobacco Use  . Smoking status: Never Smoker  . Smokeless tobacco: Never Used  Substance and Sexual Activity  .  Alcohol use: No  . Drug use: No  . Sexual activity: Not on file  Lifestyle  . Physical activity:    Days per week: Not on file    Minutes per session: Not on file  . Stress: Not on file  Relationships  . Social connections:    Talks on phone: Not on file    Gets together: Not on file    Attends religious service: Not on file    Active member of club or organization: Not on file    Attends meetings of clubs or organizations: Not on file    Relationship status: Not on file  Other Topics Concern  . Not on file  Social History Narrative  . Not on file     Family History: The patient's family history includes Cancer in her other; Cancer (age of onset: 66) in her maternal aunt; Cancer (age of onset: 61) in her maternal aunt; Cancer (age of onset: 80) in her mother; Coronary artery disease in her other; Heart attack in her brother, brother, and father; Heart disease in her brother.  ROS:   Please see the history of present illness.    All other systems reviewed and are negative.  EKGs/Labs/Other Studies Reviewed:    The following studies were reviewed today: Echocardiogram, hospitalization  records  EKG:  EKG is not ordered today.   Recent Labs: 08/14/2017: Magnesium 1.7 08/25/2017: TSH 3.546 10/19/2017: ALT 10; BUN 20; Creatinine, Ser 0.94; Hemoglobin 10.4; Platelets 184; Potassium 3.9; Sodium 144  Recent Lipid Panel    Component Value Date/Time   CHOL 109 09/19/2017   TRIG 107 09/19/2017   HDL 58.90 06/25/2013 0948   CHOLHDL 4 06/25/2013 0948   VLDL 16.6 06/25/2013 0948   LDLCALC 59 09/19/2017   LDLDIRECT 173.9 06/25/2013 0948    Physical Exam:    VS:  BP 134/70 (BP Location: Left Arm, Patient Position: Sitting, Cuff Size: Normal)   Pulse 72   Ht 5\' 1"  (1.549 m)   Wt 166 lb (75.3 kg)   BMI 31.37 kg/m     Wt Readings from Last 3 Encounters:  12/02/17 166 lb (75.3 kg)  11/15/17 169 lb 6.4 oz (76.8 kg)  10/19/17 168 lb 3.2 oz (76.3 kg)     GEN:  Well nourished, well developed in no acute distress HEENT: Normal NECK: No JVD; No carotid bruits LYMPHATICS: No lymphadenopathy CARDIAC: RRR, very short musical ejection murmur in the aortic focus, no diastolic murmurs, rubs, gallops RESPIRATORY:  Clear to auscultation without rales, wheezing or rhonchi  ABDOMEN: Soft, non-tender, non-distended MUSCULOSKELETAL:  No edema; No deformity  SKIN: Warm and dry NEUROLOGIC:  Alert and oriented x 3 PSYCHIATRIC:  Normal affect   ASSESSMENT:    1. Coronary artery disease involving native coronary artery of native heart without angina pectoris   2. Chronic diastolic heart failure (HCC)   3. Paroxysmal atrial fibrillation ([place])   4. Essential hypertension   5. Hypercholesterolemia    PLAN:    In order of problems listed above:  1. CAD s/p NSTEMI: small myocardial infarction by echo, but cardiac enzymes suggest an even smaller amount of injury.  I would interpret this as meaning that she had a demand-related myocardial infarction in the setting of atrial fibrillation with  rapid ventricular response, probably with a high-grade mid-distal LAD artery stenosis.  This appears  to be a more likely explanation for her presentation than a true acute atherothrombotic syndrome.  Is possible that there has already been  improvement in left ventricular wall motion, but repeat imaging would not have much impact on the plan of care.  I do not think it is safe to use combined antiplatelet and anticoagulation therapy in this nonagenarian.  Invasive angiography does not appear to be necessary.  She does not have angina pectoris. 2. CHF:  During her hospitalization, her chest x-ray showed transient interstitial pulmonary edema.  It is more likely this was due to the rapid arrhythmia, rather than that the reduction in LV function.  She remains on a chronic low dose of loop diuretic which she takes for leg edema.  Appears to have excellent functional status and is clinically euvolemic.  With a normal left ventricular systolic function, RAA S inhibitors do not appear to be indicated. 3. AFib: Metoprolol for rate control and treatment of heart failure and prevention of CAD events.    Is possible that the beta-blocker is responsible for her fatigue and we can try to decrease the dose to 25 mg once daily.  True antiarrhythmics are not indicated.  She is tolerating anticoagulation without bleeding.  We will try to see if we can find patient assistance to make the Eliquis affordable. CHADSVasc 6 (age 25, gender, HF, CAD, HTN). 4. YOV:ZCHYIFOYDXAJ control 5. HLP:On high-dose statin.  We will repeat labs with her next blood draw  Reviewed her prescription for metoprolol succinate 50 mg once daily, but asked her to decrease the dose to half tablet, 25 mg daily to see this if leads to improvement in her fatigue.  Medication Adjustments/Labs and Tests Ordered: Current medicines are reviewed at length with the patient today.  Concerns regarding medicines are outlined above.  No orders of the defined types were placed in this encounter.  Meds ordered this encounter  Medications  . apixaban (ELIQUIS)  5 MG TABS tablet    Sig: Take 1 tablet (5 mg total) by mouth 2 (two) times daily.    Dispense:  180 tablet    Refill:  3  . atorvastatin (LIPITOR) 40 MG tablet    Sig: Take 1 tablet (40 mg total) by mouth daily.    Dispense:  90 tablet    Refill:  3  . metoprolol succinate (TOPROL-XL) 50 MG 24 hr tablet    Sig: Take 1 tablet (50 mg total) by mouth at bedtime. Take with or immediately following a meal.    Dispense:  90 tablet    Refill:  3    Patient Instructions  Dr Sallyanne Kuster recommends that you schedule a follow-up appointment in 6 months. You will receive a reminder letter in the mail two months in advance. If you don't receive a letter, please call our office to schedule the follow-up appointment.  If you need a refill on your cardiac medications before your next appointment, please call your pharmacy.    Signed, Sanda Klein, MD  12/02/2017 8:25 PM    [facility]

## 2017-12-02 NOTE — Patient Instructions (Signed)
Dr Croitoru recommends that you schedule a follow-up appointment in 6 months. You will receive a reminder letter in the mail two months in advance. If you don't receive a letter, please call our office to schedule the follow-up appointment.  If you need a refill on your cardiac medications before your next appointment, please call your pharmacy. 

## 2017-12-14 ENCOUNTER — Ambulatory Visit (INDEPENDENT_AMBULATORY_CARE_PROVIDER_SITE_OTHER): Payer: MEDICARE | Admitting: Allergy and Immunology

## 2017-12-14 ENCOUNTER — Encounter: Payer: Self-pay | Admitting: Allergy and Immunology

## 2017-12-14 VITALS — BP 120/58 | HR 68 | Resp 16

## 2017-12-14 DIAGNOSIS — J3089 Other allergic rhinitis: Secondary | ICD-10-CM | POA: Diagnosis not present

## 2017-12-14 DIAGNOSIS — I248 Other forms of acute ischemic heart disease: Secondary | ICD-10-CM | POA: Diagnosis not present

## 2017-12-14 NOTE — Patient Instructions (Signed)
  1.  Continue OTC Nasacort 1 spray each nostril 1 time per day  2.  Continue montelukast 10 mg tablet 1 time per day  3. If needed:   A. OTC Antihistamine - Claritin / Allegra / Zyrtec  B.  Ipratropium 0.06% 2 sprays each nostril every 6 hours to dry up nose  4.  Return to clinic in 6 months or earlier if problem

## 2017-12-14 NOTE — Progress Notes (Signed)
Follow-up Note  Referring Provider: Marletta Lor, MD Primary Provider: Marletta Lor, MD Date of Office Visit: 12/14/2017  Subjective:   [name] (DOB: 03-Jun-1922) is a 84 y.o. female who returns to the [facility] [place] on 12/14/2017 in re-evaluation of the following:  HPI: [name] returns to this clinic in reevaluation of her nasal issues addressed during her initial evaluation of 15 November 2017.  She is very pleased with the response that she has received as a result of the plan started during her last evaluation.  She basically has very little issues with runny nose at this point in time and does not have any sneezing and does not have congestion.  She still has a decreased ability to smell.  She continues on Nasacort and montelukast and has been using her nasal ipratropium approximately 3 times a day.  She also appeared to have a hordeolum of her left eye during her last evaluation with responded quite well to topical antibiotic treatment.  Allergies as of 12/14/2017      Reactions   Celecoxib Swelling   REACTION: Swelling-leg      Medication List      acetaminophen 325 MG tablet Commonly known as:  TYLENOL Take 650 mg by mouth every 4 (four) hours as needed for mild pain or headache.   apixaban 5 MG Tabs tablet Commonly known as:  ELIQUIS Take 1 tablet (5 mg total) by mouth 2 (two) times daily.   atorvastatin 40 MG tablet Commonly known as:  LIPITOR Take 1 tablet (40 mg total) by mouth daily.   BESIVANCE 0.6 % Susp Generic drug:  Besifloxacin HCl INSTILL 1 DROP 3 TIMES A DAY FOR 5 DAYS   CALCIUM 600 + D PO Take 1 tablet by mouth daily.   furosemide 20 MG tablet Commonly known as:  LASIX Take 1 tablet (20 mg total) by mouth daily.   ipratropium 0.06 % nasal spray Commonly known as:  ATROVENT Place 2 sprays into both nostrils 4 (four) times daily.   ketoconazole 2 % shampoo Commonly known as:  NIZORAL Apply 1 application  topically every 7 (seven) days.   loratadine 10 MG tablet Commonly known as:  CLARITIN Take 10 mg by mouth daily.   metoprolol succinate 50 MG 24 hr tablet Commonly known as:  TOPROL-XL Take 1 tablet (50 mg total) by mouth at bedtime. Take with or immediately following a meal.   montelukast 10 MG tablet Commonly known as:  SINGULAIR Take 1 tablet (10 mg total) by mouth at bedtime.   pantoprazole 20 MG tablet Commonly known as:  PROTONIX Take 1 tablet (20 mg total) by mouth daily.   PRESERVISION AREDS 2 Caps Take 2 capsules by mouth 2 (two) times daily.       Past Medical History:  Diagnosis Date  . Allergic rhinitis, seasonal   . Breast cancer of upper-outer quadrant of right female breast ([place]) 09/13/2014  . Chronic venous insufficiency   . Dermatophytosis of nail   . Edema leg   . Family history of malignant neoplasm of breast   . Family history of malignant neoplasm of ovary   . Goiter    right thyroid nodule   . HTN (hypertension) 08/13/2017  . HX: breast cancer    bilateral  . Menopausal syndrome   . Osteoarthritis   . Osteoporosis   . Squamous cell skin cancer    right lower leg  . Wears glasses   . Wears  hearing aid    both ears    Past Surgical History:  Procedure Laterality Date  . bilateral lumpectomies for breast cancer  Aug. 2007  . BREAST LUMPECTOMY WITH RADIOACTIVE SEED LOCALIZATION Right 10/01/2014   Procedure: BREAST LUMPECTOMY WITH RADIOACTIVE SEED LOCALIZATION;  Surgeon: Erroll Luna, MD;  Location: [facility];  Service: General;  Laterality: Right;  . CATARACT EXTRACTION    . CHOLECYSTECTOMY N/A 08/13/2017   Procedure: LAPAROSCOPIC CHOLECYSTECTOMY WITH INTRAOPERATIVE CHOLANGIOGRAM;  Surgeon: Excell Seltzer, MD;  Location: [facility];  Service: General;  Laterality: N/A;  . DILATION AND CURETTAGE OF UTERUS    . ESOPHAGOGASTRODUODENOSCOPY  07/26/2011   Procedure: ESOPHAGOGASTRODUODENOSCOPY (EGD);  Surgeon: Jeryl Columbia, MD;   Location: [facility];  Service: Endoscopy;  Laterality: N/A;  . history of lumbar compression fracture    . left eardum sx  1983  . left knee arthroscopic surgery    . no screening colonoscopy    . s/p bilateral lumpectomies    . SAVORY DILATION  07/26/2011   Procedure: SAVORY DILATION;  Surgeon: Jeryl Columbia, MD;  Location: [facility];  Service: Endoscopy;  Laterality: N/A;  . status post resection squamous cell cancer right lower leg    . TONSILLECTOMY    . UMBILICAL HERNIA REPAIR      Review of systems negative except as noted in HPI / PMHx or noted below:  Review of Systems  Constitutional: Negative.   HENT: Negative.   Eyes: Negative.   Respiratory: Negative.   Cardiovascular: Negative.   Gastrointestinal: Negative.   Genitourinary: Negative.   Musculoskeletal: Negative.   Skin: Negative.   Neurological: Negative.   Endo/Heme/Allergies: Negative.   Psychiatric/Behavioral: Negative.      Objective:   Vitals:   12/14/17 1059  BP: (!) 120/58  Pulse: 68  Resp: 16          Physical Exam  HENT:  Head: Normocephalic.  Right Ear: External ear normal.  Left Ear: External ear normal.  Nose: Nose normal. No mucosal edema or rhinorrhea.  Mouth/Throat: Uvula is midline, oropharynx is clear and moist and mucous membranes are normal. No oropharyngeal exudate.  Eyes: Conjunctivae are normal.  Neck: Trachea normal. No tracheal tenderness present. No tracheal deviation present. No thyromegaly present.  Cardiovascular: Normal rate, regular rhythm, S1 normal, S2 normal and normal heart sounds.  No murmur (Systolic) heard. Pulmonary/Chest: Breath sounds normal. No stridor. No respiratory distress. She has no wheezes. She has no rales.  Musculoskeletal: She exhibits no edema.  Lymphadenopathy:       Head (right side): No tonsillar adenopathy present.       Head (left side): No tonsillar adenopathy present.    She has no cervical adenopathy.  Neurological: She is alert.    Skin: No rash noted. She is not diaphoretic. No erythema. Nails show no clubbing.    Diagnostics: none  Assessment and Plan:   1. Other allergic rhinitis     1.  Continue OTC Nasacort 1 spray each nostril 1 time per day  2.  Continue montelukast 10 mg tablet 1 time per day  3. If needed:   A. OTC Antihistamine - Claritin / Allegra / Zyrtec  B.  Ipratropium 0.06% 2 sprays each nostril every 6 hours to dry up nose  4.  Return to clinic in 6 months or earlier if problem  [place] appears to have done very well on her current plan and she will continue on Nasacort and montelukast and nasal ipratropium  if needed.  I will see her back in this clinic in 6 months or earlier if there is a problem.  Allena Katz, MD Allergy / Immunology [place]

## 2017-12-15 ENCOUNTER — Encounter: Payer: Self-pay | Admitting: Allergy and Immunology

## 2018-01-04 DIAGNOSIS — H353123 Nonexudative age-related macular degeneration, left eye, advanced atrophic without subfoveal involvement: Secondary | ICD-10-CM | POA: Diagnosis not present

## 2018-01-04 DIAGNOSIS — H353112 Nonexudative age-related macular degeneration, right eye, intermediate dry stage: Secondary | ICD-10-CM | POA: Diagnosis not present

## 2018-01-04 DIAGNOSIS — H35373 Puckering of macula, bilateral: Secondary | ICD-10-CM | POA: Diagnosis not present

## 2018-01-04 DIAGNOSIS — H43813 Vitreous degeneration, bilateral: Secondary | ICD-10-CM | POA: Diagnosis not present

## 2018-01-25 ENCOUNTER — Telehealth: Payer: Self-pay | Admitting: Cardiovascular Disease

## 2018-01-25 NOTE — Telephone Encounter (Signed)
Superficial varicose vein bursts are rarely serious, although they can be painful. Not a big concern unless the leg swells asymmetrically. MCr

## 2018-01-25 NOTE — Telephone Encounter (Signed)
Pt calling and stated she had a vein to break in her leg and it was hurting some. Pt want to speak to nurse. Please call  Pt

## 2018-01-25 NOTE — Telephone Encounter (Signed)
Spoke with patient and she stated that she had a pain last week above her knee. She then notice the next day a blood vessel had burst. Feels fine and has cleared up.  She just wanted Dr Sallyanne Kuster to be aware. Advised patient to continue same current  medications and will call back if Dr Sallyanne Kuster has any further recommendations. Keep November appointment

## 2018-02-09 DIAGNOSIS — H02104 Unspecified ectropion of left upper eyelid: Secondary | ICD-10-CM | POA: Diagnosis not present

## 2018-02-09 DIAGNOSIS — H04123 Dry eye syndrome of bilateral lacrimal glands: Secondary | ICD-10-CM | POA: Diagnosis not present

## 2018-02-09 DIAGNOSIS — Z961 Presence of intraocular lens: Secondary | ICD-10-CM | POA: Diagnosis not present

## 2018-02-09 DIAGNOSIS — H353132 Nonexudative age-related macular degeneration, bilateral, intermediate dry stage: Secondary | ICD-10-CM | POA: Diagnosis not present

## 2018-02-15 ENCOUNTER — Other Ambulatory Visit: Payer: Self-pay

## 2018-02-28 ENCOUNTER — Ambulatory Visit (INDEPENDENT_AMBULATORY_CARE_PROVIDER_SITE_OTHER): Payer: MEDICARE | Admitting: Podiatry

## 2018-02-28 ENCOUNTER — Encounter: Payer: Self-pay | Admitting: Podiatry

## 2018-02-28 DIAGNOSIS — D689 Coagulation defect, unspecified: Secondary | ICD-10-CM | POA: Diagnosis not present

## 2018-02-28 DIAGNOSIS — B351 Tinea unguium: Secondary | ICD-10-CM

## 2018-02-28 DIAGNOSIS — Q828 Other specified congenital malformations of skin: Secondary | ICD-10-CM

## 2018-02-28 DIAGNOSIS — M79676 Pain in unspecified toe(s): Secondary | ICD-10-CM

## 2018-02-28 NOTE — Progress Notes (Signed)
She presents today chief complaint of painful elongated toenails and painful calluses.  Objective: Toenails are long thick yellow dystrophic-like mycotic painful reactive hyperkeratotic lesions plantar aspect of bilateral foot no open lesions or wounds.  Pulses are palpable.  Assessment: Pain in limb secondary to onychomycosis and porokeratosis.  Plan: Debridement of toenails 1 through 5 bilateral debridement reactive hyperkeratosis placed padding.

## 2018-03-14 ENCOUNTER — Telehealth: Payer: Self-pay | Admitting: Cardiovascular Disease

## 2018-03-14 DIAGNOSIS — R5383 Other fatigue: Secondary | ICD-10-CM

## 2018-03-14 DIAGNOSIS — I2510 Atherosclerotic heart disease of native coronary artery without angina pectoris: Secondary | ICD-10-CM

## 2018-03-14 DIAGNOSIS — I10 Essential (primary) hypertension: Secondary | ICD-10-CM

## 2018-03-14 DIAGNOSIS — Z79899 Other long term (current) drug therapy: Secondary | ICD-10-CM

## 2018-03-14 DIAGNOSIS — I5032 Chronic diastolic (congestive) heart failure: Secondary | ICD-10-CM

## 2018-03-14 DIAGNOSIS — I480 Paroxysmal atrial fibrillation: Secondary | ICD-10-CM

## 2018-03-14 NOTE — Telephone Encounter (Signed)
Received call from social worker at [facility] who is with patient.  Patient went to [facility] with concerns about her medications.   Patient has been experiencing extreme fatigue and lack of energy for several months, but is getting worse.    She states patient is usually very active and is no longer participating in activities at the facility.    Patient is requesting Dr. Sallyanne Kuster review her medication list and see if any of her medications could be contributing to this.   Advised would send message to Dr. Sallyanne Kuster but may refer to PCP to rule out non cardiac causes of fatigue.   She does not take BP/HR but has BP cuff and can start to monitor this.    Denies other symptoms or issues.  No new medications.   Routed to MD to review

## 2018-03-14 NOTE — Telephone Encounter (Signed)
The most recent labs I have are from April. Would be concerned about anemia (on Eliquis) or hypokalemia (on furosemide). If labs have not been repeated since then, please order CBC, TSH, BMET and BNP. MCr

## 2018-03-15 NOTE — Telephone Encounter (Signed)
Spoke to patient, aware and verbalized understanding.   Orders placed.

## 2018-03-16 DIAGNOSIS — I480 Paroxysmal atrial fibrillation: Secondary | ICD-10-CM | POA: Diagnosis not present

## 2018-03-16 DIAGNOSIS — I5032 Chronic diastolic (congestive) heart failure: Secondary | ICD-10-CM | POA: Diagnosis not present

## 2018-03-16 DIAGNOSIS — Z79899 Other long term (current) drug therapy: Secondary | ICD-10-CM | POA: Diagnosis not present

## 2018-03-16 DIAGNOSIS — R5383 Other fatigue: Secondary | ICD-10-CM | POA: Diagnosis not present

## 2018-03-16 DIAGNOSIS — I2510 Atherosclerotic heart disease of native coronary artery without angina pectoris: Secondary | ICD-10-CM | POA: Diagnosis not present

## 2018-03-16 DIAGNOSIS — I10 Essential (primary) hypertension: Secondary | ICD-10-CM | POA: Diagnosis not present

## 2018-03-17 LAB — BASIC METABOLIC PANEL
BUN / CREAT RATIO: 20 (ref 12–28)
BUN: 20 mg/dL (ref 10–36)
CO2: 26 mmol/L (ref 20–29)
CREATININE: 0.99 mg/dL (ref 0.57–1.00)
Calcium: 9 mg/dL (ref 8.7–10.3)
Chloride: 106 mmol/L (ref 96–106)
GFR calc non Af Amer: 49 mL/min/{1.73_m2} — ABNORMAL LOW (ref >59)
GFR, EST AFRICAN AMERICAN: 56 mL/min/{1.73_m2} — AB (ref >59)
Glucose: 97 mg/dL (ref 65–99)
Potassium: 4.4 mmol/L (ref 3.5–5.2)
Sodium: 146 mmol/L — ABNORMAL HIGH (ref 134–144)

## 2018-03-17 LAB — CBC
HEMOGLOBIN: 12 g/dL (ref 11.1–15.9)
Hematocrit: 35.1 % (ref 34.0–46.6)
MCH: 30.5 pg (ref 26.6–33.0)
MCHC: 34.2 g/dL (ref 31.5–35.7)
MCV: 89 fL (ref 79–97)
Platelets: 197 10*3/uL (ref 150–450)
RBC: 3.94 x10E6/uL (ref 3.77–5.28)
RDW: 14.3 % (ref 12.3–15.4)
WBC: 5.6 10*3/uL (ref 3.4–10.8)

## 2018-03-17 LAB — BRAIN NATRIURETIC PEPTIDE: BNP: 243.5 pg/mL — ABNORMAL HIGH (ref 0.0–100.0)

## 2018-03-17 LAB — TSH: TSH: 4.32 u[IU]/mL (ref 0.450–4.500)

## 2018-03-23 ENCOUNTER — Telehealth: Payer: Self-pay | Admitting: *Deleted

## 2018-03-23 MED ORDER — METOPROLOL SUCCINATE ER 25 MG PO TB24: 25.0000 mg | ORAL_TABLET | Freq: Every day | ORAL | Status: DC

## 2018-03-23 NOTE — Telephone Encounter (Signed)
-----   Message from Sanda Klein, MD sent at 03/17/2018  7:54 AM EDT ----- Most labs are back and are normal.  No evidence of anemia or hyperthyroidism.  Electrolytes are okay.  Does not look dehydrated. Try cutting the metoprolol succinate in half to only 25 mg once daily.  Let us know in a week or so if she is feeling any better.

## 2018-03-23 NOTE — Telephone Encounter (Signed)
Patient made aware of results and verbalized understanding. Patient will call back next week with an update.

## 2018-03-28 NOTE — Telephone Encounter (Signed)
No note needed 

## 2018-03-29 ENCOUNTER — Telehealth: Payer: Self-pay | Admitting: Cardiovascular Disease

## 2018-03-29 NOTE — Telephone Encounter (Signed)
Thank you. We will stay on that lower dose.

## 2018-03-29 NOTE — Telephone Encounter (Signed)
Pt called back and states since decreasing Metoprolol to 25 mg feels much better.Will let Dr Sallyanne Kuster know .[name]

## 2018-03-29 NOTE — Telephone Encounter (Signed)
New Message:     Pt c/o medication issue:  1. Name of Medication: metoprolol succinate (TOPROL-XL) 25 MG 24 hr tablet  2. How are you currently taking this medication (dosage and times per day)? Take 1 tablet (25 mg total) by mouth at bedtime. Take with or immediately following a meal.  3. What is your medication issue? Patient states she was asked to report the change in her medication. She is not taking half a pill and believe that is better for her.

## 2018-04-14 ENCOUNTER — Other Ambulatory Visit: Payer: Self-pay | Admitting: Allergy and Immunology

## 2018-05-30 ENCOUNTER — Ambulatory Visit (INDEPENDENT_AMBULATORY_CARE_PROVIDER_SITE_OTHER): Payer: MEDICARE | Admitting: Podiatry

## 2018-05-30 DIAGNOSIS — Q828 Other specified congenital malformations of skin: Secondary | ICD-10-CM | POA: Diagnosis not present

## 2018-05-30 DIAGNOSIS — D689 Coagulation defect, unspecified: Secondary | ICD-10-CM | POA: Diagnosis not present

## 2018-05-30 DIAGNOSIS — B351 Tinea unguium: Secondary | ICD-10-CM | POA: Diagnosis not present

## 2018-05-30 DIAGNOSIS — M79676 Pain in unspecified toe(s): Secondary | ICD-10-CM

## 2018-05-31 NOTE — Progress Notes (Signed)
She presents today chief complaint of painful elongated toenails 1 through 5 bilateral.  Also complaining of painful porokeratotic lesions plantar aspect of the forefoot bilateral.  Objective: Vital signs are stable alert and oriented x3 pulses are palpable.  Neurologic sensorium is intact degenerative flexors are intact muscle strength is normal symmetrical bilateral.  Porokeratotic lesions plantar aspect of the second and third metatarsal heads bilaterally.  No open lesions or wounds are noted.  Toenails are long thick yellow dystrophic-like mycotic and painful upon debridement.  Assessment: Pain in limb secondary to onychomycosis and porokeratosis.  Plan: Debridement of toenails and porokeratotic lesions bilaterally complete.

## 2018-06-06 ENCOUNTER — Encounter: Payer: Self-pay | Admitting: Allergy and Immunology

## 2018-06-06 ENCOUNTER — Ambulatory Visit (INDEPENDENT_AMBULATORY_CARE_PROVIDER_SITE_OTHER): Payer: MEDICARE | Admitting: Allergy and Immunology

## 2018-06-06 VITALS — BP 124/54 | HR 60 | Resp 12

## 2018-06-06 DIAGNOSIS — J3089 Other allergic rhinitis: Secondary | ICD-10-CM

## 2018-06-06 DIAGNOSIS — I248 Other forms of acute ischemic heart disease: Secondary | ICD-10-CM

## 2018-06-06 MED ORDER — MONTELUKAST SODIUM 10 MG PO TABS: ORAL_TABLET | ORAL | 5 refills | Status: DC

## 2018-06-06 MED ORDER — IPRATROPIUM BROMIDE 0.06 % NA SOLN: 2.0000 | Freq: Four times a day (QID) | NASAL | 5 refills | Status: DC

## 2018-06-06 NOTE — Progress Notes (Signed)
Follow-up Note  Referring Provider: Marletta Lor, MD Primary Provider: Marletta Lor, MD Date of Office Visit: 06/06/2018  Subjective:   [name] (DOB: 1921-09-06) is a 84 y.o. female who returns to the [facility] on 06/06/2018 in re-evaluation of the following:  HPI: Jasmin returns to this clinic in reevaluation of her rhinitis.  Her last visit to this clinic was 24 Nov 2017.  Once again while consistently using nasal ipratropium and montelukast she feels as though she has had very good control of her rhinitis.  She does not have a significant amount of congestion or sneezing or runny nose while utilizing this plan.  She has determined that she no longer requires any Nasacort at this point.  She did obtain a flu vaccine this year.  Allergies as of 06/06/2018      Reactions   Celecoxib Swelling   REACTION: Swelling-leg      Medication List      acetaminophen 325 MG tablet Commonly known as:  TYLENOL Take 650 mg by mouth every 4 (four) hours as needed for mild pain or headache.   apixaban 5 MG Tabs tablet Commonly known as:  ELIQUIS Take 1 tablet (5 mg total) by mouth 2 (two) times daily.   atorvastatin 40 MG tablet Commonly known as:  LIPITOR Take 1 tablet (40 mg total) by mouth daily.   BESIVANCE 0.6 % Susp Generic drug:  Besifloxacin HCl INSTILL 1 DROP 3 TIMES A DAY FOR 5 DAYS   CALCIUM 600 + D PO Take 1 tablet by mouth daily.   furosemide 20 MG tablet Commonly known as:  LASIX Take 1 tablet (20 mg total) by mouth daily.   ipratropium 0.06 % nasal spray Commonly known as:  ATROVENT Place 2 sprays into both nostrils 4 (four) times daily.   ketoconazole 2 % shampoo Commonly known as:  NIZORAL Apply 1 application topically every 7 (seven) days.   loratadine 10 MG tablet Commonly known as:  CLARITIN Take 10 mg by mouth daily.   metoprolol succinate 25 MG 24 hr tablet Commonly known as:  TOPROL-XL Take 1 tablet (25 mg  total) by mouth at bedtime. Take with or immediately following a meal.   montelukast 10 MG tablet Commonly known as:  SINGULAIR TAKE 1 TABLET BY MOUTH EVERYDAY AT BEDTIME   pantoprazole 20 MG tablet Commonly known as:  PROTONIX Take 1 tablet (20 mg total) by mouth daily.   PRESERVISION AREDS 2 Caps Take 2 capsules by mouth 2 (two) times daily.       Past Medical History:  Diagnosis Date  . Allergic rhinitis, seasonal   . Breast cancer of upper-outer quadrant of right female breast ([place]) 09/13/2014  . Chronic venous insufficiency   . Dermatophytosis of nail   . Edema leg   . Family history of malignant neoplasm of breast   . Family history of malignant neoplasm of ovary   . Goiter    right thyroid nodule   . HTN (hypertension) 08/13/2017  . HX: breast cancer    bilateral  . Menopausal syndrome   . Osteoarthritis   . Osteoporosis   . Squamous cell skin cancer    right lower leg  . Wears glasses   . Wears hearing aid    both ears    Past Surgical History:  Procedure Laterality Date  . bilateral lumpectomies for breast cancer  Aug. 2007  . BREAST LUMPECTOMY WITH RADIOACTIVE SEED LOCALIZATION Right 10/01/2014  Procedure: BREAST LUMPECTOMY WITH RADIOACTIVE SEED LOCALIZATION;  Surgeon: Erroll Luna, MD;  Location: [facility];  Service: General;  Laterality: Right;  . CATARACT EXTRACTION    . CHOLECYSTECTOMY N/A 08/13/2017   Procedure: LAPAROSCOPIC CHOLECYSTECTOMY WITH INTRAOPERATIVE CHOLANGIOGRAM;  Surgeon: Excell Seltzer, MD;  Location: [facility];  Service: General;  Laterality: N/A;  . DILATION AND CURETTAGE OF UTERUS    . ESOPHAGOGASTRODUODENOSCOPY  07/26/2011   Procedure: ESOPHAGOGASTRODUODENOSCOPY (EGD);  Surgeon: Jeryl Columbia, MD;  Location: [facility];  Service: Endoscopy;  Laterality: N/A;  . history of lumbar compression fracture    . left eardum sx  1983  . left knee arthroscopic surgery    . no screening colonoscopy    . s/p bilateral  lumpectomies    . SAVORY DILATION  07/26/2011   Procedure: SAVORY DILATION;  Surgeon: Jeryl Columbia, MD;  [facility]: [facility];  Service: Endoscopy;  Laterality: N/A;  . status post resection squamous cell cancer right lower leg    . TONSILLECTOMY    . UMBILICAL HERNIA REPAIR      Review of systems negative except as noted in HPI / PMHx or noted below:  Review of Systems  Constitutional: Negative.   HENT: Negative.   Eyes: Negative.   Respiratory: Negative.   Cardiovascular: Negative.   Gastrointestinal: Negative.   Genitourinary: Negative.   Musculoskeletal: Negative.   Skin: Negative.   Neurological: Negative.   Endo/Heme/Allergies: Negative.   Psychiatric/Behavioral: Negative.      Objective:   Vitals:   06/06/18 1057  BP: (!) 124/54  Pulse: 60  Resp: 12  SpO2: 98%          Physical Exam  HENT:  Head: Normocephalic.  Right Ear: Tympanic membrane, external ear and ear canal normal.  Left Ear: Tympanic membrane, external ear and ear canal normal.  Nose: Nose normal. No mucosal edema or rhinorrhea.  Mouth/Throat: Uvula is midline, oropharynx is clear and moist and mucous membranes are normal. No oropharyngeal exudate.  Eyes: Conjunctivae are normal.  Neck: Trachea normal. No tracheal tenderness present. No tracheal deviation present. No thyromegaly present.  Cardiovascular: Normal rate, regular rhythm, S1 normal, S2 normal and normal heart sounds.  No murmur heard. Pulmonary/Chest: Breath sounds normal. No stridor. No respiratory distress. She has no wheezes. She has no rales.  Musculoskeletal: She exhibits no edema.  Lymphadenopathy:       Head (right side): No tonsillar adenopathy present.       Head (left side): No tonsillar adenopathy present.    She has no cervical adenopathy.  Neurological: She is alert.  Skin: No rash noted. She is not diaphoretic. No erythema. Nails show no clubbing.    Diagnostics: none  Assessment and Plan:   1. Other allergic  rhinitis     1.  Continue montelukast 10 mg tablet 1 time per day  2. If needed:   A. OTC Antihistamine - Claritin / Allegra / Zyrtec  B.  Ipratropium 0.06% 2 sprays each nostril every 6 hours to dry up nose  3.  Return to clinic in Summer 2020 or earlier if problem  Jakeisha appears to be doing quite well on her current plan and she will continue to utilize a leukotriene modifier on a regular basis and other therapy as noted above.  I will see her back in this clinic in summer 2020 or earlier if there is a problem.  Allena Katz, MD [facility] [place]

## 2018-06-06 NOTE — Patient Instructions (Signed)
  1.  Continue montelukast 10 mg tablet 1 time per day  2. If needed:   A. OTC Antihistamine - Claritin / Allegra / Zyrtec  B.  Ipratropium 0.06% 2 sprays each nostril every 6 hours to dry up nose  3.  Return to clinic in Summer 2020 or earlier if problem

## 2018-06-07 ENCOUNTER — Encounter: Payer: Self-pay | Admitting: Allergy and Immunology

## 2018-06-09 ENCOUNTER — Encounter: Payer: Self-pay | Admitting: Internal Medicine

## 2018-06-09 ENCOUNTER — Ambulatory Visit (INDEPENDENT_AMBULATORY_CARE_PROVIDER_SITE_OTHER): Payer: MEDICARE | Admitting: Internal Medicine

## 2018-06-09 VITALS — BP 110/60 | HR 62 | Temp 98.0°F | Wt 164.5 lb

## 2018-06-09 DIAGNOSIS — E7800 Pure hypercholesterolemia, unspecified: Secondary | ICD-10-CM | POA: Diagnosis not present

## 2018-06-09 DIAGNOSIS — I248 Other forms of acute ischemic heart disease: Secondary | ICD-10-CM

## 2018-06-09 DIAGNOSIS — I214 Non-ST elevation (NSTEMI) myocardial infarction: Secondary | ICD-10-CM

## 2018-06-09 DIAGNOSIS — I5032 Chronic diastolic (congestive) heart failure: Secondary | ICD-10-CM

## 2018-06-09 DIAGNOSIS — C50411 Malignant neoplasm of upper-outer quadrant of right female breast: Secondary | ICD-10-CM

## 2018-06-09 DIAGNOSIS — I10 Essential (primary) hypertension: Secondary | ICD-10-CM | POA: Diagnosis not present

## 2018-06-09 DIAGNOSIS — I480 Paroxysmal atrial fibrillation: Secondary | ICD-10-CM

## 2018-06-09 NOTE — Progress Notes (Signed)
Established Patient Office Visit     CC/Reason for Visit: Establish care and follow-up on chronic medical conditions  HPI: [name] is a 84 y.o. female who is coming in today for the above mentioned reasons. Past Medical History is significant for: Cholecystitis status post cholecystectomy in January of this year, during that hospitalization developed A. fib with RVR and a non-ST elevated MI.  Maintained on anticoagulation with Eliquis.  Rate controlled on metoprolol.  She also has a history of breast cancer follows with Dr. Jana Hakim on a yearly basis.  She also recently saw the foot doctor for treatment of her toenails.  This lady appears much younger than her stated age, she remains  physically active, uses a cane occasionally for assistance with ambulation, is completely independent with her ADLs, exercises once or twice a week, lives with her husband at [place] very active in her community.  She has no active complaints at today's visit.  Past Medical/Surgical History: Past Medical History:  Diagnosis Date  . Allergic rhinitis, seasonal   . Breast cancer of upper-outer quadrant of right female breast ([place]) 09/13/2014  . Chronic venous insufficiency   . Dermatophytosis of nail   . Edema leg   . Family history of malignant neoplasm of breast   . Family history of malignant neoplasm of ovary   . Goiter    right thyroid nodule   . HTN (hypertension) 08/13/2017  . HX: breast cancer    bilateral  . Menopausal syndrome   . Osteoarthritis   . Osteoporosis   . Squamous cell skin cancer    right lower leg  . Wears glasses   . Wears hearing aid    both ears    Past Surgical History:  Procedure Laterality Date  . bilateral lumpectomies for breast cancer  Aug. 2007  . BREAST LUMPECTOMY WITH RADIOACTIVE SEED LOCALIZATION Right 10/01/2014   Procedure: BREAST LUMPECTOMY WITH RADIOACTIVE SEED LOCALIZATION;  Surgeon: Erroll Luna, MD;  Location: [facility];  Service: General;  Laterality: Right;  . CATARACT EXTRACTION    . CHOLECYSTECTOMY N/A 08/13/2017   Procedure: LAPAROSCOPIC CHOLECYSTECTOMY WITH INTRAOPERATIVE CHOLANGIOGRAM;  Surgeon: Excell Seltzer, MD;  Location: [facility];  Service: General;  Laterality: N/A;  . DILATION AND CURETTAGE OF UTERUS    . ESOPHAGOGASTRODUODENOSCOPY  07/26/2011   Procedure: ESOPHAGOGASTRODUODENOSCOPY (EGD);  Surgeon: Jeryl Columbia, MD;  Location: [facility];  Service: Endoscopy;  Laterality: N/A;  . history of lumbar compression fracture    . left eardum sx  1983  . left knee arthroscopic surgery    . no screening colonoscopy    . s/p bilateral lumpectomies    . SAVORY DILATION  07/26/2011   Procedure: SAVORY DILATION;  Surgeon: Jeryl Columbia, MD;  [facility];  Service: Endoscopy;  Laterality: N/A;  . status post resection squamous cell cancer right lower leg    . TONSILLECTOMY    . UMBILICAL HERNIA REPAIR      Social History:  reports that she has never smoked. She has never used smokeless tobacco. She reports that she does not drink alcohol or use drugs.  Allergies: Allergies  Allergen Reactions  . Celecoxib Swelling    REACTION: Swelling-leg    Family History:  Family History  Problem Relation Age of Onset  . Cancer Mother 45       breast  . Heart attack Father   . Heart attack Brother   .  Heart attack Brother   . Cancer Maternal Aunt 55       breast  . Coronary artery disease Other   . Cancer Other        ovarian; daughter of mat aunt w/ BC in 69s  . Heart disease Brother   . Cancer Maternal Aunt 60       breast     Current Outpatient Medications:  .  apixaban (ELIQUIS) 5 MG TABS tablet, Take 1 tablet (5 mg total) by mouth 2 (two) times daily., Disp: 180 tablet, Rfl: 3 .  atorvastatin (LIPITOR) 40 MG tablet, Take 1 tablet (40 mg total) by mouth daily., Disp: 90 tablet, Rfl: 3 .  BESIVANCE 0.6 % SUSP, INSTILL 1 DROP 3 TIMES A DAY FOR 5 DAYS, Disp: , Rfl: 0 .   Calcium Carb-Cholecalciferol (CALCIUM 600 + D PO), Take 1 tablet by mouth daily. , Disp: , Rfl:  .  furosemide (LASIX) 20 MG tablet, Take 1 tablet (20 mg total) by mouth daily., Disp: 90 tablet, Rfl: 3 .  ipratropium (ATROVENT) 0.06 % nasal spray, Place 2 sprays into both nostrils 4 (four) times daily., Disp: 30 mL, Rfl: 5 .  ketoconazole (NIZORAL) 2 % shampoo, Apply 1 application topically every 7 (seven) days. , Disp: , Rfl:  .  metoprolol succinate (TOPROL-XL) 25 MG 24 hr tablet, Take 1 tablet (25 mg total) by mouth at bedtime. Take with or immediately following a meal., Disp: , Rfl:  .  montelukast (SINGULAIR) 10 MG tablet, TAKE 1 TABLET BY MOUTH EVERYDAY AT BEDTIME, Disp: 30 tablet, Rfl: 5 .  Multiple Vitamins-Minerals (PRESERVISION AREDS 2) CAPS, Take 2 capsules by mouth 2 (two) times daily. , Disp: , Rfl:  .  pantoprazole (PROTONIX) 20 MG tablet, Take 1 tablet (20 mg total) by mouth daily., Disp: 90 tablet, Rfl: 5  Review of Systems:  Constitutional: Denies fever, chills, diaphoresis, appetite change.  HEENT: Denies photophobia, eye pain, redness, hearing loss, ear pain, congestion, sore throat, rhinorrhea, sneezing, mouth sores, trouble swallowing, neck pain, neck stiffness and tinnitus.   Respiratory: Denies SOB, DOE, cough, chest tightness,  and wheezing.   Cardiovascular: Denies chest pain, palpitations and leg swelling.  Musculoskeletal: Denies myalgias, back pain, joint swelling, arthralgias and gait problem.  Neurological: Denies dizziness, seizures, syncope, weakness, light-headedness, numbness and headaches.  Hematological: Denies adenopathy. Easy bruising, personal or family bleeding history  Psychiatric/Behavioral: Denies suicidal ideation, mood changes, confusion, nervousness, sleep disturbance and agitation    Physical Exam: Vitals:   06/09/18 1003  BP: 110/60  Pulse: 62  Temp: 98 F (36.7 C)  TempSrc: Oral  SpO2: 97%  Weight: 164 lb 8 oz (74.6 kg)    Body mass  index is 31.08 kg/m.   Constitutional: NAD, calm, comfortable Eyes: PERRL, lids and conjunctivae normal, wears corrective lenses ENMT: Mucous membranes are moist. Neck: normal, supple, no masses, no thyromegaly Respiratory: clear to auscultation bilaterally, no wheezing, no crackles. Normal respiratory effort. No accessory muscle use.  Cardiovascular: Regular rate and rhythm, positive systolic ejection murmur/ rubs / gallops. No extremity edema. 2+ pedal pulses. No carotid bruits.  Abdomen: no tenderness, no masses palpated. No hepatosplenomegaly. Bowel sounds positive.  Musculoskeletal: no clubbing / cyanosis. No joint deformity upper and lower extremities. Good ROM, no contractures. Normal muscle tone.  Psychiatric: Normal judgment and insight. Alert and oriented x 3. Normal mood.    Impression and Plan:  Hypercholesterolemia  Chronic diastolic heart failure (HCC)  Paroxysmal atrial fibrillation (HCC)  Non-ST  elevated myocardial infarction [facility])  Essential hypertension  Malignant neoplasm of upper-outer quadrant of right female breast, unspecified estrogen receptor status ([place])  -She remains very stable despite her chronic medical conditions. -She appears much younger than her stated age, remains active both physically and in her community. -She follows up with cardiology regularly and with oncology on a yearly basis. -She is not needing medication refills today. -Will plan to see her for her annual preventive exam in 3 to 4 months.   Patient Instructions  -It was so nice meeting you and good luck on your play today!  -We will see you back in 3 to 4 months for your annual physical.  Please come fasting to this visit.     Lelon Frohlich, MD [place]

## 2018-06-09 NOTE — Patient Instructions (Signed)
-  It was so nice meeting you and good luck on your play today!  -We will see you back in 3 to 4 months for your annual physical.  Please come fasting to this visit.

## 2018-06-12 ENCOUNTER — Encounter: Payer: Self-pay | Admitting: Cardiovascular Disease

## 2018-06-12 ENCOUNTER — Ambulatory Visit (INDEPENDENT_AMBULATORY_CARE_PROVIDER_SITE_OTHER): Payer: MEDICARE | Admitting: Cardiovascular Disease

## 2018-06-12 VITALS — BP 108/50 | HR 83 | Ht 61.0 in | Wt 164.8 lb

## 2018-06-12 DIAGNOSIS — I2510 Atherosclerotic heart disease of native coronary artery without angina pectoris: Secondary | ICD-10-CM

## 2018-06-12 DIAGNOSIS — E7800 Pure hypercholesterolemia, unspecified: Secondary | ICD-10-CM

## 2018-06-12 DIAGNOSIS — I248 Other forms of acute ischemic heart disease: Secondary | ICD-10-CM | POA: Diagnosis not present

## 2018-06-12 DIAGNOSIS — I5032 Chronic diastolic (congestive) heart failure: Secondary | ICD-10-CM

## 2018-06-12 DIAGNOSIS — I10 Essential (primary) hypertension: Secondary | ICD-10-CM

## 2018-06-12 DIAGNOSIS — I480 Paroxysmal atrial fibrillation: Secondary | ICD-10-CM | POA: Diagnosis not present

## 2018-06-12 NOTE — Patient Instructions (Signed)
Medication Instructions:  Dr Croitoru recommends that you continue on your current medications as directed. Please refer to the Current Medication list given to you today.  If you need a refill on your cardiac medications before your next appointment, please call your pharmacy.   Follow-Up: At [facility], you and your health needs are our priority.  As part of our continuing mission to provide you with exceptional heart care, we have created designated Provider Care Teams.  These Care Teams include your primary Cardiologist (physician) and Advanced Practice Providers (APPs -  Physician Assistants and Nurse Practitioners) who all work together to provide you with the care you need, when you need it. You will need a follow up appointment in 12 months.  Please call our office 2 months in advance to schedule this appointment.  You may see Mihai Croitoru, MD or one of the following Advanced Practice Providers on your designated Care Team: Hao Meng, PA-C . Dajanae Brophy Duke, PA-C 

## 2018-06-12 NOTE — Progress Notes (Signed)
Cardiology Office Note:    Date:  06/12/2018   ID:  [name], DOB Oct 06, 1921, MRN [id number]  PCP:  Isaac Bliss, Rayford Halsted, MD  Cardiologist:  Sanda Klein, MD   Referring MD: Marletta Lor, MD   Chief Complaint  Patient presents with  . Follow-up    pt denied chest pain  Atrial fibrillation, non-STEMI, congestive heart failure  History of Present Illness:    [name] is a 84 y.o. female with a hx of paroxysmal atrial fibrillation complicated by small non-STEMI and brief congestive heart failure, following cholecystectomy in January 2019. The overall impression was that it was more likely she had demand myocardial injury from tachycardia rather than a true acute coronary event.  Since her last appointment she has done well and has not had any problems with palpitations, chest pain or dyspnea.  She never had palpitations with previous documented episodes of atrial fibrillation.  He walks with a cane, but has not had any falls or bleeding problems.  Eliquis remains a costly medication.  Her husband takes it as well and they would have a lot of problems affording it were not for the pharmaceutical manufacturer assistance program.  She denies angina with activity, orthopnea, PND, dizziness or syncope.  She has not had any bleeding problems or focal neurological events.  As long as she wears compression stockings and takes a low-dose of loop diuretic, she does not develop leg edema.  Past Medical History:  Diagnosis Date  . Allergic rhinitis, seasonal   . Breast cancer of upper-outer quadrant of right female breast ([place]) 09/13/2014  . Chronic venous insufficiency   . Dermatophytosis of nail   . Edema leg   . Family history of malignant neoplasm of breast   . Family history of malignant neoplasm of ovary   . Goiter    right thyroid nodule   . HTN (hypertension) 08/13/2017  . HX: breast cancer    bilateral  . Menopausal syndrome   . Osteoarthritis   .  Osteoporosis   . Squamous cell skin cancer    right lower leg  . Wears glasses   . Wears hearing aid    both ears    Past Surgical History:  Procedure Laterality Date  . bilateral lumpectomies for breast cancer  Aug. 2007  . BREAST LUMPECTOMY WITH RADIOACTIVE SEED LOCALIZATION Right 10/01/2014   Procedure: BREAST LUMPECTOMY WITH RADIOACTIVE SEED LOCALIZATION;  Surgeon: Erroll Luna, MD;  Location: [facility];  Service: General;  Laterality: Right;  . CATARACT EXTRACTION    . CHOLECYSTECTOMY N/A 08/13/2017   Procedure: LAPAROSCOPIC CHOLECYSTECTOMY WITH INTRAOPERATIVE CHOLANGIOGRAM;  Surgeon: Excell Seltzer, MD;  Location: [facility];  Service: General;  Laterality: N/A;  . DILATION AND CURETTAGE OF UTERUS    . ESOPHAGOGASTRODUODENOSCOPY  07/26/2011   Procedure: ESOPHAGOGASTRODUODENOSCOPY (EGD);  Surgeon: Jeryl Columbia, MD;  Location: [facility];  Service: Endoscopy;  Laterality: N/A;  . history of lumbar compression fracture    . left eardum sx  1983  . left knee arthroscopic surgery    . no screening colonoscopy    . s/p bilateral lumpectomies    . SAVORY DILATION  07/26/2011   Procedure: SAVORY DILATION;  Surgeon: Jeryl Columbia, MD;  Location: [facility];  Service: Endoscopy;  Laterality: N/A;  . status post resection squamous cell cancer right lower leg    . TONSILLECTOMY    . UMBILICAL HERNIA REPAIR      Current Medications: Current Meds  Medication Sig  . apixaban (ELIQUIS) 5 MG TABS tablet Take 1 tablet (5 mg total) by mouth 2 (two) times daily.  Marland Kitchen atorvastatin (LIPITOR) 40 MG tablet Take 1 tablet (40 mg total) by mouth daily.  Marland Kitchen BESIVANCE 0.6 % SUSP INSTILL 1 DROP 3 TIMES A DAY FOR 5 DAYS  . Calcium Carb-Cholecalciferol (CALCIUM 600 + D PO) Take 1 tablet by mouth daily.   . furosemide (LASIX) 20 MG tablet Take 1 tablet (20 mg total) by mouth daily.  Marland Kitchen ipratropium (ATROVENT) 0.06 % nasal spray Place 2 sprays into both nostrils 4 (four) times daily.  Marland Kitchen  ketoconazole (NIZORAL) 2 % shampoo Apply 1 application topically every 7 (seven) days.   . metoprolol succinate (TOPROL-XL) 25 MG 24 hr tablet Take 1 tablet (25 mg total) by mouth at bedtime. Take with or immediately following a meal.  . montelukast (SINGULAIR) 10 MG tablet TAKE 1 TABLET BY MOUTH EVERYDAY AT BEDTIME  . Multiple Vitamins-Minerals (PRESERVISION AREDS 2) CAPS Take 2 capsules by mouth 2 (two) times daily.   . pantoprazole (PROTONIX) 20 MG tablet Take 1 tablet (20 mg total) by mouth daily.     Allergies:   Celecoxib   Social History   Socioeconomic History  . Marital status: Married    Spouse name: Not on file  . Number of children: Not on file  . Years of education: Not on file  . Highest education level: Not on file  Occupational History  . Not on file  Social Needs  . Financial resource strain: Not on file  . Food insecurity:    Worry: Not on file    Inability: Not on file  . Transportation needs:    Medical: Not on file    Non-medical: Not on file  Tobacco Use  . Smoking status: Never Smoker  . Smokeless tobacco: Never Used  Substance and Sexual Activity  . Alcohol use: No  . Drug use: No  . Sexual activity: Not on file  Lifestyle  . Physical activity:    Days per week: Not on file    Minutes per session: Not on file  . Stress: Not on file  Relationships  . Social connections:    Talks on phone: Not on file    Gets together: Not on file    Attends religious service: Not on file    Active member of club or organization: Not on file    Attends meetings of clubs or organizations: Not on file    Relationship status: Not on file  Other Topics Concern  . Not on file  Social History Narrative  . Not on file     Family History: The patient's family history includes Cancer in her other; Cancer (age of onset: 75) in her maternal aunt; Cancer (age of onset: 38) in her maternal aunt; Cancer (age of onset: 54) in her mother; Coronary artery disease in her  other; Heart attack in her brother, brother, and father; Heart disease in her brother.  ROS:   Please see the history of present illness.    All other systems reviewed and are negative.  EKGs/Labs/Other Studies Reviewed:    The following studies were reviewed today:   EKG:  EKG is ordered today.  It shows sinus rhythm, left atrial abnormality, left ventricular hypertrophy with QRS widening (120 ms) and secondary repolarization abnormalities.  Recent Labs: 08/14/2017: Magnesium 1.7 10/19/2017: ALT 10 03/16/2018: BNP 243.5; BUN 20; Creatinine, Ser 0.99; Hemoglobin 12.0; Platelets 197; Potassium 4.4;  Sodium 146; TSH 4.320  Recent Lipid Panel    Component Value Date/Time   CHOL 109 09/19/2017   TRIG 107 09/19/2017   HDL 58.90 06/25/2013 0948   CHOLHDL 4 06/25/2013 0948   VLDL 16.6 06/25/2013 0948   LDLCALC 59 09/19/2017   LDLDIRECT 173.9 06/25/2013 0948    Physical Exam:    VS:  BP (!) 108/50   Pulse 83   Ht 5\' 1"  (1.549 m)   Wt 164 lb 12.8 oz (74.8 kg)   BMI 31.14 kg/m     Wt Readings from Last 3 Encounters:  06/12/18 164 lb 12.8 oz (74.8 kg)  06/09/18 164 lb 8 oz (74.6 kg)  12/02/17 166 lb (75.3 kg)     General: Alert, oriented x3, no distress, appears well Head: no evidence of trauma, PERRL, EOMI, no exophtalmos or lid lag, no myxedema, no xanthelasma; normal ears, nose and oropharynx Neck: normal jugular venous pulsations and no hepatojugular reflux; brisk carotid pulses without delay and no carotid bruits Chest: clear to auscultation, no signs of consolidation by percussion or palpation, normal fremitus, symmetrical and full respiratory excursions Cardiovascular: normal position and quality of the apical impulse, regular rhythm, normal first and second heart sounds, no murmurs, rubs or gallops Abdomen: no tenderness or distention, no masses by palpation, no abnormal pulsatility or arterial bruits, normal bowel sounds, no hepatosplenomegaly Extremities: no clubbing,  cyanosis or edema; 2+ radial, ulnar and brachial pulses bilaterally; 2+ right femoral, posterior tibial and dorsalis pedis pulses; 2+ left femoral, posterior tibial and dorsalis pedis pulses; no subclavian or femoral bruits Neurological: grossly nonfocal Psych: Normal mood and affect   ASSESSMENT:    1. Coronary artery disease involving native coronary artery of native heart without angina pectoris   2. Chronic diastolic heart failure (HCC)   3. Paroxysmal atrial fibrillation ([place])   4. Essential hypertension   5. Hypercholesterolemia    PLAN:    In order of problems listed above:  1. CAD s/p NSTEMI: Probably demand-related myocardial infarction in the setting of atrial fibrillation with  rapid ventricular response, probably with a high-grade mid-distal LAD artery stenosis.    She has not had any angina since her hospitalization.  Invasive angiography does not appear to be necessary.  Continue conservative management statin and beta-blocker.  Not on aspirin due to simultaneous anticoagulation. 2. CHF:  Asymptomatic, clinically euvolemic other than edema if she does not take her loop diuretic.  LV systolic function was minimally depressed (EF 50-55%) and she is not on an RAAS inhibitor. 3. AFib:Tolerating metoprolol and Eliquis without complications.  The cost of the anticoagulant is a bigger problem.  CHADSVasc 6 (age 37, gender, HF, CAD, HTN). 4. ERX:VQMGQQPYP control 5. HLP:On high-dose statin.  Cholesterol parameters well within desirable range  Medication Adjustments/Labs and Tests Ordered: Current medicines are reviewed at length with the patient today.  Concerns regarding medicines are outlined above.  Orders Placed This Encounter  Procedures  . EKG 12-Lead   No orders of the defined types were placed in this encounter.   Patient Instructions  Medication Instructions:  Dr Sallyanne Kuster recommends that you continue on your current medications as directed. Please refer to the  Current Medication list given to you today.  If you need a refill on your cardiac medications before your next appointment, please call your pharmacy.   Follow-Up: At [facility], you and your health needs are our priority.  As part of our continuing mission to provide you with exceptional heart care,  we have created designated Provider Care Teams.  These Care Teams include your primary Cardiologist (physician) and Advanced Practice Providers (APPs -  Physician Assistants and Nurse Practitioners) who all work together to provide you with the care you need, when you need it. You will need a follow up appointment in 12 months.  Please call our office 2 months in advance to schedule this appointment.  You may see Sanda Klein, MD or one of the following Advanced Practice Providers on your designated Care Team: [place], [place] . Fabian Sharp, PA-C    Signed, Sanda Klein, MD  06/12/2018 3:40 PM    [place]

## 2018-06-27 ENCOUNTER — Telehealth: Payer: Self-pay

## 2018-06-27 NOTE — Telephone Encounter (Signed)
Pt husband dropped off a folder with patient assistance forms filled out for her and him for Dr. Sallyanne Kuster. Placed in physicians box.

## 2018-07-04 DIAGNOSIS — H353123 Nonexudative age-related macular degeneration, left eye, advanced atrophic without subfoveal involvement: Secondary | ICD-10-CM | POA: Diagnosis not present

## 2018-07-04 DIAGNOSIS — H353112 Nonexudative age-related macular degeneration, right eye, intermediate dry stage: Secondary | ICD-10-CM | POA: Diagnosis not present

## 2018-08-15 ENCOUNTER — Telehealth: Payer: Self-pay | Admitting: Cardiovascular Disease

## 2018-08-15 NOTE — Telephone Encounter (Signed)
Spoke with Cherylann Ratel at [facility] regarding request of MD. She will have to investigate to see if this can be accommodated. She will call back

## 2018-08-15 NOTE — Telephone Encounter (Addendum)
Spoke with husband and relayed info/request per Dr. Loletha Grayer. He provided phone # for [facility] [phone])  Spoke with Hoyle Sauer, nurse in assisted living, to relay request of Dr. Sallyanne Kuster. She states husband would need to bring patient to the nurse each day for BP assessment. She transferred me to Cherylann Ratel (supervisor) to follow up on orthostatic BP request. LMTCB

## 2018-08-15 NOTE — Telephone Encounter (Signed)
Returned call to patient's spouse. He reports patient tripped and fell on her knees this morning. She did not pass out. The nurse at [facility] assessed her and she was OK - no bleeding issues. BP was 119/44 during nurse assessment. They were advised to notify our office of BP. Explained that BP was 108/50 at last MD visit and no changes to meds were made. Husband states BP is not routinely checked by nurse there, but wonder if it needs to be. Will notify MD and let them know if there are any recommendations.

## 2018-08-15 NOTE — Telephone Encounter (Signed)
Please ask them to check BP at rest, sitting, once daily for a week and send [place] readings. Also please ask for a one time assessment of orthostatic BP (check BP sitting, standing x1 minute and standing x 3 minutes). Thanks! MCr

## 2018-08-15 NOTE — Telephone Encounter (Signed)
Pt c/o BP issue: STAT if pt c/o blurred vision, one-sided weakness or slurred speech  1. What are your last 5 BP readings? 119/44- pt had a fall this morning  2. Are you having any other symptoms (ex. Dizziness, headache, blurred vision, passed out)?  no 3. What is your BP issue? Blood pressure is real low- Nurse at [facility] told her to call

## 2018-08-16 NOTE — Telephone Encounter (Signed)
Ms [name] called back and stated the nurse had approved- blood pressure monitoring for 7 days. They will fax report result to office- fax number given [phone]

## 2018-08-16 NOTE — Telephone Encounter (Signed)
Pt and husband opted to wait until after that new year to submit paperwork. This plan was discussed at the last office visit 06/12/2018. Patient verbalized understanding.  Form completed and signed by Dr Sallyanne Kuster today. Faxed to [place].  Awaiting response.

## 2018-08-25 ENCOUNTER — Telehealth: Payer: Self-pay | Admitting: Cardiovascular Disease

## 2018-08-25 NOTE — Telephone Encounter (Signed)
Returned call to patient's husband Dr.Croitoru reviewed B/P readings.He advised most of readings in acceptable range.Advised to continue same medications.No changes.

## 2018-08-25 NOTE — Telephone Encounter (Signed)
New Message   Patients spouse Herbie [place] is calling because [place] was suppose to fax over BP numbers for a week. He wants to know if it has been received. He states that it was sent Monday.

## 2018-08-31 ENCOUNTER — Ambulatory Visit (INDEPENDENT_AMBULATORY_CARE_PROVIDER_SITE_OTHER): Payer: MEDICARE | Admitting: Podiatry

## 2018-08-31 ENCOUNTER — Encounter: Payer: Self-pay | Admitting: Podiatry

## 2018-08-31 DIAGNOSIS — B351 Tinea unguium: Secondary | ICD-10-CM | POA: Diagnosis not present

## 2018-08-31 DIAGNOSIS — Q828 Other specified congenital malformations of skin: Secondary | ICD-10-CM | POA: Diagnosis not present

## 2018-08-31 DIAGNOSIS — D689 Coagulation defect, unspecified: Secondary | ICD-10-CM

## 2018-08-31 DIAGNOSIS — M79676 Pain in unspecified toe(s): Secondary | ICD-10-CM | POA: Diagnosis not present

## 2018-08-31 NOTE — Progress Notes (Signed)
She presents today chief complaint of painful elongated toenails 1 through 5 bilateral she is also complaining of reactive hyperkeratotic lesions plantar aspect of bilateral foot.  Objective: Toenails are long thick yellow dystrophic clinically mycotic multiple reactive hyperkeratotic lesion porokeratotic lesions plantar aspect of the forefoot bilateral with pulses normal symmetrical bilateral.  Assessment: Pain in limb secondary to onychomycosis and porokeratosis.  Plan: Debridement of all reactive hyperkeratotic tissue and toenails 1 through 5 bilateral.

## 2018-09-22 DIAGNOSIS — M6281 Muscle weakness (generalized): Secondary | ICD-10-CM | POA: Diagnosis not present

## 2018-09-22 DIAGNOSIS — R2681 Unsteadiness on feet: Secondary | ICD-10-CM | POA: Diagnosis not present

## 2018-09-25 DIAGNOSIS — R2681 Unsteadiness on feet: Secondary | ICD-10-CM | POA: Diagnosis not present

## 2018-09-25 DIAGNOSIS — M6281 Muscle weakness (generalized): Secondary | ICD-10-CM | POA: Diagnosis not present

## 2018-09-26 ENCOUNTER — Encounter: Payer: MEDICARE | Admitting: Internal Medicine

## 2018-09-27 DIAGNOSIS — R2681 Unsteadiness on feet: Secondary | ICD-10-CM | POA: Diagnosis not present

## 2018-09-27 DIAGNOSIS — M6281 Muscle weakness (generalized): Secondary | ICD-10-CM | POA: Diagnosis not present

## 2018-09-29 DIAGNOSIS — M6281 Muscle weakness (generalized): Secondary | ICD-10-CM | POA: Diagnosis not present

## 2018-09-29 DIAGNOSIS — R2681 Unsteadiness on feet: Secondary | ICD-10-CM | POA: Diagnosis not present

## 2018-10-02 DIAGNOSIS — M6281 Muscle weakness (generalized): Secondary | ICD-10-CM | POA: Diagnosis not present

## 2018-10-02 DIAGNOSIS — R2681 Unsteadiness on feet: Secondary | ICD-10-CM | POA: Diagnosis not present

## 2018-10-04 DIAGNOSIS — M6281 Muscle weakness (generalized): Secondary | ICD-10-CM | POA: Diagnosis not present

## 2018-10-04 DIAGNOSIS — R2681 Unsteadiness on feet: Secondary | ICD-10-CM | POA: Diagnosis not present

## 2018-10-06 ENCOUNTER — Other Ambulatory Visit: Payer: Self-pay

## 2018-10-06 ENCOUNTER — Encounter: Payer: Self-pay | Admitting: Internal Medicine

## 2018-10-06 ENCOUNTER — Ambulatory Visit (INDEPENDENT_AMBULATORY_CARE_PROVIDER_SITE_OTHER): Payer: MEDICARE | Admitting: Internal Medicine

## 2018-10-06 VITALS — BP 120/64 | HR 71 | Temp 98.4°F | Ht 61.0 in | Wt 164.1 lb

## 2018-10-06 DIAGNOSIS — I5032 Chronic diastolic (congestive) heart failure: Secondary | ICD-10-CM | POA: Diagnosis not present

## 2018-10-06 DIAGNOSIS — I2510 Atherosclerotic heart disease of native coronary artery without angina pectoris: Secondary | ICD-10-CM | POA: Diagnosis not present

## 2018-10-06 DIAGNOSIS — I480 Paroxysmal atrial fibrillation: Secondary | ICD-10-CM

## 2018-10-06 DIAGNOSIS — M6281 Muscle weakness (generalized): Secondary | ICD-10-CM | POA: Diagnosis not present

## 2018-10-06 DIAGNOSIS — R2681 Unsteadiness on feet: Secondary | ICD-10-CM | POA: Diagnosis not present

## 2018-10-06 DIAGNOSIS — E7800 Pure hypercholesterolemia, unspecified: Secondary | ICD-10-CM | POA: Diagnosis not present

## 2018-10-06 DIAGNOSIS — I10 Essential (primary) hypertension: Secondary | ICD-10-CM | POA: Diagnosis not present

## 2018-10-06 DIAGNOSIS — M810 Age-related osteoporosis without current pathological fracture: Secondary | ICD-10-CM

## 2018-10-06 LAB — CBC WITH DIFFERENTIAL/PLATELET
BASOS PCT: 1 % (ref 0.0–3.0)
Basophils Absolute: 0.1 10*3/uL (ref 0.0–0.1)
Eosinophils Absolute: 0.1 10*3/uL (ref 0.0–0.7)
Eosinophils Relative: 1 % (ref 0.0–5.0)
HCT: 36.8 % (ref 36.0–46.0)
Hemoglobin: 12.6 g/dL (ref 12.0–15.0)
Lymphocytes Relative: 20.1 % (ref 12.0–46.0)
Lymphs Abs: 1.1 10*3/uL (ref 0.7–4.0)
MCHC: 34.2 g/dL (ref 30.0–36.0)
MCV: 92.1 fl (ref 78.0–100.0)
Monocytes Absolute: 0.4 10*3/uL (ref 0.1–1.0)
Monocytes Relative: 7.6 % (ref 3.0–12.0)
Neutro Abs: 3.8 10*3/uL (ref 1.4–7.7)
Neutrophils Relative %: 70.3 % (ref 43.0–77.0)
Platelets: 194 10*3/uL (ref 150.0–400.0)
RBC: 4 Mil/uL (ref 3.87–5.11)
RDW: 13.9 % (ref 11.5–15.5)
WBC: 5.3 10*3/uL (ref 4.0–10.5)

## 2018-10-06 LAB — COMPREHENSIVE METABOLIC PANEL
ALT: 17 U/L (ref 0–35)
AST: 18 U/L (ref 0–37)
Albumin: 4 g/dL (ref 3.5–5.2)
Alkaline Phosphatase: 123 U/L — ABNORMAL HIGH (ref 39–117)
BUN: 32 mg/dL — ABNORMAL HIGH (ref 6–23)
CHLORIDE: 104 meq/L (ref 96–112)
CO2: 29 mEq/L (ref 19–32)
Calcium: 9 mg/dL (ref 8.4–10.5)
Creatinine, Ser: 1.32 mg/dL — ABNORMAL HIGH (ref 0.40–1.20)
GFR: 37.41 mL/min — ABNORMAL LOW (ref >60.00)
Glucose, Bld: 104 mg/dL — ABNORMAL HIGH (ref 70–99)
POTASSIUM: 4.6 meq/L (ref 3.5–5.1)
Sodium: 142 mEq/L (ref 135–145)
Total Bilirubin: 0.6 mg/dL (ref 0.2–1.2)
Total Protein: 6.3 g/dL (ref 6.0–8.3)

## 2018-10-06 LAB — LIPID PANEL
Cholesterol: 149 mg/dL (ref 0–200)
HDL: 55.1 mg/dL (ref >39.00)
LDL Cholesterol: 76 mg/dL (ref 0–99)
NONHDL: 93.53
Total CHOL/HDL Ratio: 3
Triglycerides: 89 mg/dL (ref 0.0–149.0)
VLDL: 17.8 mg/dL (ref 0.0–40.0)

## 2018-10-06 LAB — TSH: TSH: 4.04 u[IU]/mL (ref 0.35–4.50)

## 2018-10-06 LAB — VITAMIN B12: VITAMIN B 12: 165 pg/mL — AB (ref 211–911)

## 2018-10-06 LAB — VITAMIN D 25 HYDROXY (VIT D DEFICIENCY, FRACTURES): VITD: 31.14 ng/mL (ref 30.00–100.00)

## 2018-10-06 NOTE — Progress Notes (Signed)
Established Patient Office Visit     CC/Reason for Visit: Yearly follow up of chronic conditions  HPI: [name] is a 85 y.o. female who is coming in today for the above mentioned reasons. Past Medical History is significant for:  Cholecystitis status post cholecystectomy in January 2019 , during that hospitalization developed A. fib with RVR and a non-ST elevated MI.  Maintained on anticoagulation with Eliquis.  Rate controlled on metoprolol.  She also has a history of breast cancer follows with Dr. Jana Hakim on a yearly basis.  She also recently saw the foot doctor for treatment of her toenails.  This lady appears much younger than her stated age, she remains  physically active, uses a cane occasionally for assistance with ambulation, is completely independent with her ADLs, exercises once or twice a week, lives with her husband at [place] very active in her community.  She mentions concerns that her fingernails are splitting and not growing as much as usual.  Past Medical/Surgical History: Past Medical History:  Diagnosis Date  . Allergic rhinitis, seasonal   . Breast cancer of upper-outer quadrant of right female breast ([place]) 09/13/2014  . Chronic venous insufficiency   . Dermatophytosis of nail   . Edema leg   . Family history of malignant neoplasm of breast   . Family history of malignant neoplasm of ovary   . Goiter    right thyroid nodule   . HTN (hypertension) 08/13/2017  . HX: breast cancer    bilateral  . Menopausal syndrome   . Osteoarthritis   . Osteoporosis   . Squamous cell skin cancer    right lower leg  . Wears glasses   . Wears hearing aid    both ears    Past Surgical History:  Procedure Laterality Date  . bilateral lumpectomies for breast cancer  Aug. 2007  . BREAST LUMPECTOMY WITH RADIOACTIVE SEED LOCALIZATION Right 10/01/2014   Procedure: BREAST LUMPECTOMY WITH RADIOACTIVE SEED LOCALIZATION;  Surgeon: Erroll Luna, MD;   Location: [facility];  Service: General;  Laterality: Right;  . CATARACT EXTRACTION    . CHOLECYSTECTOMY N/A 08/13/2017   Procedure: LAPAROSCOPIC CHOLECYSTECTOMY WITH INTRAOPERATIVE CHOLANGIOGRAM;  Surgeon: Excell Seltzer, MD;  Location: [facility];  Service: General;  Laterality: N/A;  . DILATION AND CURETTAGE OF UTERUS    . ESOPHAGOGASTRODUODENOSCOPY  07/26/2011   Procedure: ESOPHAGOGASTRODUODENOSCOPY (EGD);  Surgeon: Jeryl Columbia, MD;  Location: [facility];  Service: Endoscopy;  Laterality: N/A;  . history of lumbar compression fracture    . left eardum sx  1983  . left knee arthroscopic surgery    . no screening colonoscopy    . s/p bilateral lumpectomies    . SAVORY DILATION  07/26/2011   Procedure: SAVORY DILATION;  Surgeon: Jeryl Columbia, MD;  Location: [facility];  Service: Endoscopy;  Laterality: N/A;  . status post resection squamous cell cancer right lower leg    . TONSILLECTOMY    . UMBILICAL HERNIA REPAIR      Social History:  reports that she has never smoked. She has never used smokeless tobacco. She reports that she does not drink alcohol or use drugs.  Allergies: Allergies  Allergen Reactions  . Celecoxib Swelling    REACTION: Swelling-leg    Family History:  Family History  Problem Relation Age of Onset  . Cancer Mother 50       breast  . Heart attack Father   . Heart  attack Brother   . Heart attack Brother   . Cancer Maternal Aunt 55       breast  . Coronary artery disease Other   . Cancer Other        ovarian; daughter of mat aunt w/ BC in 4s  . Heart disease Brother   . Cancer Maternal Aunt 60       breast     Current Outpatient Medications:  .  apixaban (ELIQUIS) 5 MG TABS tablet, Take 1 tablet (5 mg total) by mouth 2 (two) times daily., Disp: 180 tablet, Rfl: 3 .  atorvastatin (LIPITOR) 40 MG tablet, Take 1 tablet (40 mg total) by mouth daily., Disp: 90 tablet, Rfl: 3 .  BESIVANCE 0.6 % SUSP, INSTILL 1 DROP 3 TIMES A DAY FOR 5  DAYS, Disp: , Rfl: 0 .  Calcium Carb-Cholecalciferol (CALCIUM 600 + D PO), Take 1 tablet by mouth daily. , Disp: , Rfl:  .  furosemide (LASIX) 20 MG tablet, Take 1 tablet (20 mg total) by mouth daily., Disp: 90 tablet, Rfl: 3 .  ipratropium (ATROVENT) 0.06 % nasal spray, Place 2 sprays into both nostrils 4 (four) times daily., Disp: 30 mL, Rfl: 5 .  ketoconazole (NIZORAL) 2 % shampoo, Apply 1 application topically every 7 (seven) days. , Disp: , Rfl:  .  metoprolol succinate (TOPROL-XL) 25 MG 24 hr tablet, Take 1 tablet (25 mg total) by mouth at bedtime. Take with or immediately following a meal., Disp: , Rfl:  .  montelukast (SINGULAIR) 10 MG tablet, TAKE 1 TABLET BY MOUTH EVERYDAY AT BEDTIME, Disp: 30 tablet, Rfl: 5 .  Multiple Vitamins-Minerals (PRESERVISION AREDS 2) CAPS, Take 2 capsules by mouth 2 (two) times daily. , Disp: , Rfl:   Review of Systems:  Constitutional: Denies fever, chills, diaphoresis, appetite change and fatigue.  HEENT: Denies photophobia, eye pain, redness, hearing loss, ear pain, congestion, sore throat, rhinorrhea, sneezing, mouth sores, trouble swallowing, neck pain, neck stiffness and tinnitus.   Respiratory: Denies SOB, DOE, cough, chest tightness,  and wheezing.   Cardiovascular: Denies chest pain, palpitations and leg swelling.  Gastrointestinal: Denies nausea, vomiting, abdominal pain, diarrhea, constipation, blood in stool and abdominal distention.  Genitourinary: Denies dysuria, urgency, frequency, hematuria, flank pain and difficulty urinating.  Endocrine: Denies: hot or cold intolerance, sweats, changes in hair or nails, polyuria, polydipsia. Musculoskeletal: Denies myalgias, back pain, joint swelling, arthralgias and gait problem.  Skin: Denies pallor, rash and wound.  Neurological: Denies dizziness, seizures, syncope, weakness, light-headedness, numbness and headaches.  Hematological: Denies adenopathy. Easy bruising, personal or family bleeding history   Psychiatric/Behavioral: Denies suicidal ideation, mood changes, confusion, nervousness, sleep disturbance and agitation    Physical Exam: Vitals:   10/06/18 0912  BP: 120/64  Pulse: 71  Temp: 98.4 F (36.9 C)  TempSrc: Oral  SpO2: 97%  Weight: 164 lb 1.6 oz (74.4 kg)  Height: '5\' 1"'$  (1.549 m)    Body mass index is 31.01 kg/m.   Constitutional: NAD, calm, comfortable Eyes: PERRL, lids and conjunctivae normal, wear corrective lenses ENMT: Mucous membranes are moist.  Respiratory: clear to auscultation bilaterally, no wheezing, no crackles. Normal respiratory effort. No accessory muscle use.  Cardiovascular: Regular rate and rhythm, no murmurs / rubs / gallops. No extremity edema. 2+ pedal pulses. No carotid bruits.  Abdomen: no tenderness, no masses palpated. No hepatosplenomegaly. Bowel sounds positive.  Musculoskeletal: no clubbing / cyanosis. No joint deformity upper and lower extremities. Good ROM, no contractures. Normal muscle tone.  Skin: no rashes, lesions, ulcers. No induration Neurologic: CN 2-12 grossly intact. Sensation intact, DTR normal. Strength 5/5 in all 4.  Psychiatric: Normal judgment and insight. Alert and oriented x 3. Normal mood.    Impression and Plan:  Osteoporosis, unspecified osteoporosis type, unspecified pathological fracture presence - Plan: VITAMIN D 25 Hydroxy (Vit-D Deficiency, Fractures) -Per DEXA in 2018.  Essential hypertension - -Well controlled.  Paroxysmal atrial fibrillation (HCC) -Rate controlled on BB and anticoagulated on eliquis.  Hypercholesterolemia  -Last LDL 59 3/19. -Lipids today, continue statin  Chronic diastolic heart failure ([place])  -Compensated  Coronary artery disease involving native coronary artery of native heart without angina pectoris -Follows with cardiology  Will get tetanus at pharmacy.   Patient Instructions  -NIce seeing you today!  -Follow up in 6 months. Contact us if any acute issues before  then.  -Lab work today; will contact you with results.  -Tetanus immunization at pharmacy.   Preventive Care 15 Years and Older, Female Preventive care refers to lifestyle choices and visits with your health care provider that can promote health and wellness. What does preventive care include?  A yearly physical exam. This is also called an annual well check.  Dental exams once or twice a year.  Routine eye exams. Ask your health care provider how often you should have your eyes checked.  Personal lifestyle choices, including: ? Daily care of your teeth and gums. ? Regular physical activity. ? Eating a healthy diet. ? Avoiding tobacco and drug use. ? Limiting alcohol use. ? Practicing safe sex. ? Taking low-dose aspirin every day. ? Taking vitamin and mineral supplements as recommended by your health care provider. What happens during an annual well check? The services and screenings done by your health care provider during your annual well check will depend on your age, overall health, lifestyle risk factors, and family history of disease. Counseling Your health care provider may ask you questions about your:  Alcohol use.  Tobacco use.  Drug use.  Emotional well-being.  Home and relationship well-being.  Sexual activity.  Eating habits.  History of falls.  Memory and ability to understand (cognition).  Work and work Statistician.  Reproductive health.  Screening You may have the following tests or measurements:  Height, weight, and BMI.  Blood pressure.  Lipid and cholesterol levels. These may be checked every 5 years, or more frequently if you are over 47 years old.  Skin check.  Lung cancer screening. You may have this screening every year starting at age 83 if you have a 30-pack-year history of smoking and currently smoke or have quit within the past 15 years.  Colorectal cancer screening. All adults should have this screening starting at age 55  and continuing until age 86. You will have tests every 1-10 years, depending on your results and the type of screening test. People at increased risk should start screening at an earlier age. Screening tests may include: ? Guaiac-based fecal occult blood testing. ? Fecal immunochemical test (FIT). ? Stool DNA test. ? Virtual colonoscopy. ? Sigmoidoscopy. During this test, a flexible tube with a tiny camera (sigmoidoscope) is used to examine your rectum and lower colon. The sigmoidoscope is inserted through your anus into your rectum and lower colon. ? Colonoscopy. During this test, a long, thin, flexible tube with a tiny camera (colonoscope) is used to examine your entire colon and rectum.  Hepatitis C blood test.  Hepatitis B blood test.  Sexually transmitted disease (STD) testing.  Diabetes screening. This is done by checking your blood sugar (glucose) after you have not eaten for a while (fasting). You may have this done every 1-3 years.  Bone density scan. This is done to screen for osteoporosis. You may have this done starting at age 64.  Mammogram. This may be done every 1-2 years. Talk to your health care provider about how often you should have regular mammograms. Talk with your health care provider about your test results, treatment options, and if necessary, the need for more tests. Vaccines Your health care provider may recommend certain vaccines, such as:  Influenza vaccine. This is recommended every year.  Tetanus, diphtheria, and acellular pertussis (Tdap, Td) vaccine. You may need a Td booster every 10 years.  Varicella vaccine. You may need this if you have not been vaccinated.  Zoster vaccine. You may need this after age 48.  Measles, mumps, and rubella (MMR) vaccine. You may need at least one dose of MMR if you were born in 1957 or later. You may also need a second dose.  Pneumococcal 13-valent conjugate (PCV13) vaccine. One dose is recommended after age 38.   Pneumococcal polysaccharide (PPSV23) vaccine. One dose is recommended after age 65.  Meningococcal vaccine. You may need this if you have certain conditions.  Hepatitis A vaccine. You may need this if you have certain conditions or if you travel or work in places where you may be exposed to hepatitis A.  Hepatitis B vaccine. You may need this if you have certain conditions or if you travel or work in places where you may be exposed to hepatitis B.  Haemophilus influenzae type b (Hib) vaccine. You may need this if you have certain conditions. Talk to your health care provider about which screenings and vaccines you need and how often you need them. This information is not intended to replace advice given to you by your health care provider. Make sure you discuss any questions you have with your health care provider. Document Released: 08/01/2015 Document Revised: 08/25/2017 Document Reviewed: 05/06/2015 Elsevier Interactive Patient Education  2019 [place], [place] [facility]

## 2018-10-06 NOTE — Patient Instructions (Signed)
-NIce seeing you today!  -Follow up in 6 months. Contact us if any acute issues before then.  -Lab work today; will contact you with results.  -Tetanus immunization at pharmacy.   Preventive Care 5 Years and Older, Female Preventive care refers to lifestyle choices and visits with your health care provider that can promote health and wellness. What does preventive care include?  A yearly physical exam. This is also called an annual well check.  Dental exams once or twice a year.  Routine eye exams. Ask your health care provider how often you should have your eyes checked.  Personal lifestyle choices, including: ? Daily care of your teeth and gums. ? Regular physical activity. ? Eating a healthy diet. ? Avoiding tobacco and drug use. ? Limiting alcohol use. ? Practicing safe sex. ? Taking low-dose aspirin every day. ? Taking vitamin and mineral supplements as recommended by your health care provider. What happens during an annual well check? The services and screenings done by your health care provider during your annual well check will depend on your age, overall health, lifestyle risk factors, and family history of disease. Counseling Your health care provider may ask you questions about your:  Alcohol use.  Tobacco use.  Drug use.  Emotional well-being.  Home and relationship well-being.  Sexual activity.  Eating habits.  History of falls.  Memory and ability to understand (cognition).  Work and work Statistician.  Reproductive health.  Screening You may have the following tests or measurements:  Height, weight, and BMI.  Blood pressure.  Lipid and cholesterol levels. These may be checked every 5 years, or more frequently if you are over 62 years old.  Skin check.  Lung cancer screening. You may have this screening every year starting at age 88 if you have a 30-pack-year history of smoking and currently smoke or have quit within the past 15 years.   Colorectal cancer screening. All adults should have this screening starting at age 61 and continuing until age 41. You will have tests every 1-10 years, depending on your results and the type of screening test. People at increased risk should start screening at an earlier age. Screening tests may include: ? Guaiac-based fecal occult blood testing. ? Fecal immunochemical test (FIT). ? Stool DNA test. ? Virtual colonoscopy. ? Sigmoidoscopy. During this test, a flexible tube with a tiny camera (sigmoidoscope) is used to examine your rectum and lower colon. The sigmoidoscope is inserted through your anus into your rectum and lower colon. ? Colonoscopy. During this test, a long, thin, flexible tube with a tiny camera (colonoscope) is used to examine your entire colon and rectum.  Hepatitis C blood test.  Hepatitis B blood test.  Sexually transmitted disease (STD) testing.  Diabetes screening. This is done by checking your blood sugar (glucose) after you have not eaten for a while (fasting). You may have this done every 1-3 years.  Bone density scan. This is done to screen for osteoporosis. You may have this done starting at age 69.  Mammogram. This may be done every 1-2 years. Talk to your health care provider about how often you should have regular mammograms. Talk with your health care provider about your test results, treatment options, and if necessary, the need for more tests. Vaccines Your health care provider may recommend certain vaccines, such as:  Influenza vaccine. This is recommended every year.  Tetanus, diphtheria, and acellular pertussis (Tdap, Td) vaccine. You may need a Td booster every 10 years.  Varicella vaccine. You may need this if you have not been vaccinated.  Zoster vaccine. You may need this after age 97.  Measles, mumps, and rubella (MMR) vaccine. You may need at least one dose of MMR if you were born in 1957 or later. You may also need a second dose.   Pneumococcal 13-valent conjugate (PCV13) vaccine. One dose is recommended after age 23.  Pneumococcal polysaccharide (PPSV23) vaccine. One dose is recommended after age 95.  Meningococcal vaccine. You may need this if you have certain conditions.  Hepatitis A vaccine. You may need this if you have certain conditions or if you travel or work in places where you may be exposed to hepatitis A.  Hepatitis B vaccine. You may need this if you have certain conditions or if you travel or work in places where you may be exposed to hepatitis B.  Haemophilus influenzae type b (Hib) vaccine. You may need this if you have certain conditions. Talk to your health care provider about which screenings and vaccines you need and how often you need them. This information is not intended to replace advice given to you by your health care provider. Make sure you discuss any questions you have with your health care provider. Document Released: 08/01/2015 Document Revised: 08/25/2017 Document Reviewed: 05/06/2015 Elsevier Interactive Patient Education  2019 [employer].

## 2018-10-09 DIAGNOSIS — R2681 Unsteadiness on feet: Secondary | ICD-10-CM | POA: Diagnosis not present

## 2018-10-09 DIAGNOSIS — M6281 Muscle weakness (generalized): Secondary | ICD-10-CM | POA: Diagnosis not present

## 2018-10-10 ENCOUNTER — Telehealth: Payer: Self-pay | Admitting: *Deleted

## 2018-10-10 ENCOUNTER — Other Ambulatory Visit: Payer: Self-pay | Admitting: Internal Medicine

## 2018-10-10 DIAGNOSIS — I10 Essential (primary) hypertension: Secondary | ICD-10-CM

## 2018-10-10 DIAGNOSIS — I5032 Chronic diastolic (congestive) heart failure: Secondary | ICD-10-CM

## 2018-10-10 NOTE — Telephone Encounter (Signed)
Spoke to [name]

## 2018-10-10 NOTE — Telephone Encounter (Signed)
Copied from [place] [id number]. Topic: General - Other >> Oct 09, 2018  2:08 PM Carolyn [facility] wrote:  Pt ask that Apolonio Schneiders give her a call >> Oct 09, 2018  4:57 PM Waylan Rocher, [place] L wrote: Patient needs to know if she needs to come in for B12 injection still? Says Dr. Jerilee Hoh suggested she get on mychart as well and I gave her mychart support line for assistance.

## 2018-10-11 DIAGNOSIS — R2681 Unsteadiness on feet: Secondary | ICD-10-CM | POA: Diagnosis not present

## 2018-10-11 DIAGNOSIS — M6281 Muscle weakness (generalized): Secondary | ICD-10-CM | POA: Diagnosis not present

## 2018-10-13 ENCOUNTER — Telehealth: Payer: Self-pay | Admitting: Oncology

## 2018-10-13 ENCOUNTER — Telehealth: Payer: Self-pay | Admitting: *Deleted

## 2018-10-13 DIAGNOSIS — R2681 Unsteadiness on feet: Secondary | ICD-10-CM | POA: Diagnosis not present

## 2018-10-13 DIAGNOSIS — M6281 Muscle weakness (generalized): Secondary | ICD-10-CM | POA: Diagnosis not present

## 2018-10-13 NOTE — Telephone Encounter (Signed)
Opened by accident, patient's calender will be mailed.

## 2018-10-13 NOTE — Telephone Encounter (Signed)
Spoke with pt's husband and is wandering if [employer] form for Eliquis  5 mg has been filled out will forward to [facility] triage to check on status ./cy

## 2018-10-13 NOTE — Telephone Encounter (Signed)
Scheduled appointments per provider's request.  Patient's line is either busy or invalid, updated schedule will be mailed.

## 2018-10-14 NOTE — Telephone Encounter (Signed)
I believe they were approved. Please confirm with our Pharm Ds.

## 2018-10-16 NOTE — Telephone Encounter (Signed)
Chelley? - I think you had their paperwork last - did you forward it to someone else?

## 2018-10-17 DIAGNOSIS — R2681 Unsteadiness on feet: Secondary | ICD-10-CM | POA: Diagnosis not present

## 2018-10-17 DIAGNOSIS — M6281 Muscle weakness (generalized): Secondary | ICD-10-CM | POA: Diagnosis not present

## 2018-10-17 NOTE — Telephone Encounter (Signed)
° °  Patient's spouse is calling, states forms are missing the date. [employer] faxing forms to be updated with the date. Mr Randle is requesting a phone call

## 2018-10-17 NOTE — Telephone Encounter (Signed)
Attempted to contact patient back regarding this matter, line was busy x2 attempts. Will route to make sure staff working with Dr.C is aware this paperwork is going to be faxed back.

## 2018-10-18 ENCOUNTER — Other Ambulatory Visit: Payer: Self-pay

## 2018-10-18 ENCOUNTER — Ambulatory Visit: Payer: MEDICARE | Admitting: Oncology

## 2018-10-18 ENCOUNTER — Inpatient Hospital Stay: Payer: MEDICARE | Attending: Oncology | Admitting: Oncology

## 2018-10-18 ENCOUNTER — Other Ambulatory Visit: Payer: MEDICARE

## 2018-10-18 ENCOUNTER — Inpatient Hospital Stay: Payer: MEDICARE

## 2018-10-18 VITALS — BP 133/42 | HR 74 | Temp 98.4°F | Resp 18 | Ht 61.0 in | Wt 164.7 lb

## 2018-10-18 DIAGNOSIS — I2510 Atherosclerotic heart disease of native coronary artery without angina pectoris: Secondary | ICD-10-CM | POA: Diagnosis not present

## 2018-10-18 DIAGNOSIS — Z9013 Acquired absence of bilateral breasts and nipples: Secondary | ICD-10-CM | POA: Diagnosis not present

## 2018-10-18 DIAGNOSIS — Z853 Personal history of malignant neoplasm of breast: Secondary | ICD-10-CM

## 2018-10-18 DIAGNOSIS — M818 Other osteoporosis without current pathological fracture: Secondary | ICD-10-CM | POA: Insufficient documentation

## 2018-10-18 DIAGNOSIS — C50411 Malignant neoplasm of upper-outer quadrant of right female breast: Secondary | ICD-10-CM | POA: Diagnosis not present

## 2018-10-18 DIAGNOSIS — I10 Essential (primary) hypertension: Secondary | ICD-10-CM | POA: Insufficient documentation

## 2018-10-18 LAB — CMP (CANCER CENTER ONLY)
ALT: 18 U/L (ref 0–44)
AST: 20 U/L (ref 15–41)
Albumin: 3.6 g/dL (ref 3.5–5.0)
Alkaline Phosphatase: 126 U/L (ref 38–126)
Anion gap: 9 (ref 5–15)
BUN: 24 mg/dL — ABNORMAL HIGH (ref 8–23)
CO2: 28 mmol/L (ref 22–32)
Calcium: 9.3 mg/dL (ref 8.9–10.3)
Chloride: 108 mmol/L (ref 98–111)
Creatinine: 1.22 mg/dL — ABNORMAL HIGH (ref 0.44–1.00)
GFR, Est AFR Am: 44 mL/min — ABNORMAL LOW (ref >60)
GFR, Estimated: 38 mL/min — ABNORMAL LOW (ref >60)
Glucose, Bld: 134 mg/dL — ABNORMAL HIGH (ref 70–99)
Potassium: 4.2 mmol/L (ref 3.5–5.1)
Sodium: 145 mmol/L (ref 135–145)
Total Bilirubin: 0.5 mg/dL (ref 0.3–1.2)
Total Protein: 6.7 g/dL (ref 6.5–8.1)

## 2018-10-18 LAB — CBC WITH DIFFERENTIAL (CANCER CENTER ONLY)
Abs Immature Granulocytes: 0.01 10*3/uL (ref 0.00–0.07)
Basophils Absolute: 0.1 10*3/uL (ref 0.0–0.1)
Basophils Relative: 1 %
Eosinophils Absolute: 0.1 10*3/uL (ref 0.0–0.5)
Eosinophils Relative: 1 %
HCT: 37.2 % (ref 36.0–46.0)
Hemoglobin: 12 g/dL (ref 12.0–15.0)
Immature Granulocytes: 0 %
Lymphocytes Relative: 22 %
Lymphs Abs: 1.3 10*3/uL (ref 0.7–4.0)
MCH: 31 pg (ref 26.0–34.0)
MCHC: 32.3 g/dL (ref 30.0–36.0)
MCV: 96.1 fL (ref 80.0–100.0)
Monocytes Absolute: 0.4 10*3/uL (ref 0.1–1.0)
Monocytes Relative: 6 %
Neutro Abs: 4 10*3/uL (ref 1.7–7.7)
Neutrophils Relative %: 70 %
Platelet Count: 177 10*3/uL (ref 150–400)
RBC: 3.87 MIL/uL (ref 3.87–5.11)
RDW: 13.2 % (ref 11.5–15.5)
WBC Count: 5.7 10*3/uL (ref 4.0–10.5)
nRBC: 0 % (ref 0.0–0.2)

## 2018-10-18 NOTE — Progress Notes (Signed)
[place]  [phone] Fax:(336) [phone]     ID: [name] DOB: 1938/09/20  MR#: [id number]  SWH#:[id number]  Patient Care Team: Isaac Bliss, Rayford Halsted, MD as PCP - General (Internal Medicine) Sanda Klein, MD as PCP - Cardiology (Cardiology) Erroll Luna, MD as Consulting Physician (General Surgery) Nazareth Kirk, Virgie Dad, MD as Consulting Physician (Oncology) Arloa Koh, MD as Consulting Physician (Radiation Oncology) [name], RN as Registered Nurse [name], RN as Registered Nurse [name], NP (Inactive) as Nurse Practitioner (Nurse Practitioner) [name], MD as Consulting Physician (Gastroenterology) OTHER MD:  CHIEF COMPLAINT: ductal carcinoma in situ, right breast; remote invasive cancer left breast  CURRENT TREATMENT: observation   BREAST CANCER HISTORY: From the earlier summary note:  I saw Kennedey previously for a history of bilateral breast cancers, treated with bilateral lumpectomies and anti-estrogens for 5 years. She did not receive radiation treatments. She was last seen here in 2011.  She had her annual screening mammography at [facility] 08/27/2013, and this showed the breast density to be category B. There was an area of new grouped heterogeneous calcifications in the right breast at the 11:00 middle depth. A focal asymmetric density anterior to that area was seen in one view only. Additional views were recommended, but if they were performed I do not have those records. There appears to have been mammography on 03/05/2014, but again I do not have those records.  On 08/30/2014, bilateral diagnostic mammography with tomosynthesis this was obtained. The calcifications at the 11:00 position in the right breast were increased in number. There were no other significant findings. Biopsy of the area in question to 20 11/06/2014 showed ([id number]) high-grade ductal carcinoma in situ, with mucin  extravasation.no definitive invasive component was seen however. This high-grade noninvasive tumor was estrogen receptor positive at 100%, progesterone receptor positive at 44%, both with strong staining intensity.  Her subsequent history is as detailed below  INTERVAL HISTORY: Marisel returns today for for follow-up of her bilateral breast cancers.  She was not scheduled for an appointment today, but thought she was sent since she was here certainly did not want to make her come again given the current pandemic.  She is due for routine mammogram this month. She reports they moved her appointment out due to coronavirus concerns.   REVIEW OF SYSTEMS: Myliah reports she fell in January and hurt her left knee, but she states she's doing better. She notes her husband is doing well. She states she keeps busy with housework.  [facility] much [facility] shut [facility] including the exercise area.  The patient denies unusual headaches, visual changes, nausea, vomiting, stiff neck, dizziness, or gait imbalance. There has been no cough, phlegm production, or pleurisy, no chest pain or pressure, and no change in bowel or bladder habits. The patient denies fever, rash, bleeding, unexplained fatigue or unexplained weight loss. A detailed review of systems was otherwise entirely negative.   PAST MEDICAL HISTORY: Past Medical History:  Diagnosis Date   Allergic rhinitis, seasonal    Breast cancer of upper-outer quadrant of right female breast ([place]) 09/13/2014   Chronic venous insufficiency    Dermatophytosis of nail    Edema leg    Family history of malignant neoplasm of breast    Family history of malignant neoplasm of ovary    Goiter    right thyroid nodule    HTN (hypertension) 08/13/2017   HX: breast cancer    bilateral  Menopausal syndrome    Osteoarthritis    Osteoporosis    Squamous cell skin cancer    right lower leg   Wears glasses    Wears hearing aid    both  ears    PAST SURGICAL HISTORY: Past Surgical History:  Procedure Laterality Date   bilateral lumpectomies for breast cancer  Aug. 2007   BREAST LUMPECTOMY WITH RADIOACTIVE SEED LOCALIZATION Right 10/01/2014   Procedure: BREAST LUMPECTOMY WITH RADIOACTIVE SEED LOCALIZATION;  Surgeon: Erroll Luna, MD;  Location: [facility];  Service: General;  Laterality: Right;   CATARACT EXTRACTION     CHOLECYSTECTOMY N/A 08/13/2017   Procedure: LAPAROSCOPIC CHOLECYSTECTOMY WITH INTRAOPERATIVE CHOLANGIOGRAM;  Surgeon: Excell Seltzer, MD;  Location: [facility];  Service: General;  Laterality: N/A;   DILATION AND CURETTAGE OF UTERUS     ESOPHAGOGASTRODUODENOSCOPY  07/26/2011   Procedure: ESOPHAGOGASTRODUODENOSCOPY (EGD);  Surgeon: Jeryl [place], MD;  Location: [facility];  Service: Endoscopy;  Laterality: N/A;   history of lumbar compression fracture     left eardum sx  1983   left knee arthroscopic surgery     no screening colonoscopy     s/p bilateral lumpectomies     SAVORY DILATION  07/26/2011   Procedure: SAVORY DILATION;  Surgeon: Jeryl [place], MD;  Location: [facility];  Service: Endoscopy;  Laterality: N/A;   status post resection squamous cell cancer right lower leg     TONSILLECTOMY     UMBILICAL HERNIA REPAIR      FAMILY HISTORY Family History  Problem Relation Age of Onset   Cancer Mother 81       breast   Heart attack Father    Heart attack Brother    Heart attack Brother    Cancer Maternal Aunt 9       breast   Coronary artery disease Other    Cancer Other        ovarian; daughter of mat aunt w/ BC in 21s   Heart disease Brother    Cancer Maternal Aunt 60       breast  the patient's father died at age 76 from a heart attack. The patient's mother died at age 55. The patient's mother was one of 30 sisters, 9 siblings. 2 of the sisters had breast cancer, postmenopausal. Roni Friberg self had 2 half-brothers and 1 full brother as well as one  half-sister.There is no history of ovarian cancer in the familyexcept as noted.  GYNECOLOGIC HISTORY:  No LMP recorded. Patient is postmenopausal. Menarche age 28, the patient is GX P0. 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 Her husband [name] is a former Programme researcher, broadcasting/film/video. They currently reside at [facility] [place], with no pets.    ADVANCED DIRECTIVES: in place   HEALTH MAINTENANCE: Social History   Tobacco Use   Smoking status: Never Smoker   Smokeless tobacco: Never Used  Substance Use Topics   Alcohol use: No   Drug use: No     Colonoscopy:  PAP:  Bone density:  Lipid panel:  Allergies  Allergen Reactions   Celecoxib Swelling    REACTION: Swelling-leg    Current Outpatient Medications  Medication Sig Dispense Refill   apixaban (ELIQUIS) 5 MG TABS tablet Take 1 tablet (5 mg total) by mouth 2 (two) times daily. 180 tablet 3   atorvastatin (LIPITOR) 40 MG tablet  Take 1 tablet (40 mg total) by mouth daily. 90 tablet 3   BESIVANCE 0.6 % SUSP INSTILL 1 DROP 3 TIMES A DAY FOR 5 DAYS  0   Calcium Carb-Cholecalciferol (CALCIUM 600 + D PO) Take 1 tablet by mouth daily.      furosemide (LASIX) 20 MG tablet Take 1 tablet (20 mg total) by mouth daily. 90 tablet 3   ipratropium (ATROVENT) 0.06 % nasal spray Place 2 sprays into both nostrils 4 (four) times daily. 30 mL 5   ketoconazole (NIZORAL) 2 % shampoo Apply 1 application topically every 7 (seven) days.      metoprolol succinate (TOPROL-XL) 25 MG 24 hr tablet Take 1 tablet (25 mg total) by mouth at bedtime. Take with or immediately following a meal.     montelukast (SINGULAIR) 10 MG tablet TAKE 1 TABLET BY MOUTH EVERYDAY AT BEDTIME 30 tablet 5   Multiple Vitamins-Minerals (PRESERVISION AREDS 2) CAPS Take 2 capsules by mouth 2 (two) times daily.      No  current facility-administered medications for this visit.     OBJECTIVE: elderly white woman who appears stated age  85:   10/18/18 1408  BP: (!) 133/42  Pulse: 74  Resp: 18  Temp: 98.4 F (36.9 C)  SpO2: 98%     Body mass index is 31.12 kg/m.    ECOG FS:2 - Symptomatic, <50% confined to bed  Sclerae unicteric, pupils round and equal No cervical or supraclavicular adenopathy; some hearing loss Lungs no rales or rhonchi Heart regular rate and rhythm Abd soft, nontender, positive bowel sounds MSK no focal spinal tenderness, no upper extremity lymphedema Neuro: nonfocal, well oriented, appropriate affect Breasts: Status post bilateral lumpectomies.  In the left breast there is a scar in the lower outer quadrant which is imaged below and requires only follow-up.  Both axillae are benign  Left breast photo 10/18/2018:     LAB RESULTS:  CMP     Component Value Date/Time   NA 145 10/18/2018 1351   NA 146 (H) 03/16/2018 1202   NA 145 09/19/2017   NA 145 09/18/2014 1245   K 4.2 10/18/2018 1351   K 3.7 09/19/2017   K 3.9 09/18/2014 1245   CL 108 10/18/2018 1351   CO2 28 10/18/2018 1351   CO2 25 09/18/2014 1245   GLUCOSE 134 (H) 10/18/2018 1351   GLUCOSE 107 09/18/2014 1245   BUN 24 (H) 10/18/2018 1351   BUN 20 03/16/2018 1202   BUN 20.1 09/18/2014 1245   CREATININE 1.22 (H) 10/18/2018 1351   CREATININE 0.97 09/19/2017   CREATININE 1.2 (H) 09/18/2014 1245   CALCIUM 9.3 10/18/2018 1351   CALCIUM 7.3 09/19/2017   CALCIUM 9.1 09/18/2014 1245   PROT 6.7 10/18/2018 1351   PROT 5.1 09/19/2017   PROT 6.8 09/18/2014 1245   ALBUMIN 3.6 10/18/2018 1351   ALBUMIN 2.8 09/19/2017   ALBUMIN 3.8 09/18/2014 1245   AST 20 10/18/2018 1351   AST 14 09/18/2014 1245   ALT 18 10/18/2018 1351   ALT 23 09/19/2017   ALT 8 09/18/2014 1245   ALKPHOS 126 10/18/2018 1351   ALKPHOS 87 09/19/2017   ALKPHOS 100 09/18/2014 1245   BILITOT 0.5 10/18/2018 1351   BILITOT 0.49 09/18/2014 1245    GFRNONAA 38 (L) 10/18/2018 1351   GFRAA 44 (L) 10/18/2018 1351    INo results found for: SPEP, UPEP  Lab Results  Component Value Date   WBC 5.7 10/18/2018   NEUTROABS 4.0 10/18/2018  HGB 12.0 10/18/2018   HCT 37.2 10/18/2018   MCV 96.1 10/18/2018   PLT 177 10/18/2018      Chemistry      Component Value Date/Time   NA 145 10/18/2018 1351   NA 146 (H) 03/16/2018 1202   NA 145 09/19/2017   NA 145 09/18/2014 1245   K 4.2 10/18/2018 1351   K 3.7 09/19/2017   K 3.9 09/18/2014 1245   CL 108 10/18/2018 1351   CO2 28 10/18/2018 1351   CO2 25 09/18/2014 1245   BUN 24 (H) 10/18/2018 1351   BUN 20 03/16/2018 1202   BUN 20.1 09/18/2014 1245   CREATININE 1.22 (H) 10/18/2018 1351   CREATININE 0.97 09/19/2017   CREATININE 1.2 (H) 09/18/2014 1245      Component Value Date/Time   CALCIUM 9.3 10/18/2018 1351   CALCIUM 7.3 09/19/2017   CALCIUM 9.1 09/18/2014 1245   ALKPHOS 126 10/18/2018 1351   ALKPHOS 87 09/19/2017   ALKPHOS 100 09/18/2014 1245   AST 20 10/18/2018 1351   AST 14 09/18/2014 1245   ALT 18 10/18/2018 1351   ALT 23 09/19/2017   ALT 8 09/18/2014 1245   BILITOT 0.5 10/18/2018 1351   BILITOT 0.49 09/18/2014 1245       Lab Results  Component Value Date   LABCA2 34 05/20/2011    No components found for: [id number]  No results for input(s): INR in the last 168 hours.  Urinalysis    Component Value Date/Time   COLORURINE YELLOW 08/12/2017 0627   APPEARANCEUR CLEAR 08/12/2017 0627   LABSPEC 1.014 08/12/2017 0627   PHURINE 6.0 08/12/2017 0627   GLUCOSEU NEGATIVE 08/12/2017 0627   HGBUR SMALL (A) 08/12/2017 0627   HGBUR trace-intact 10/28/2009 0905   BILIRUBINUR NEGATIVE 08/12/2017 0627   KETONESUR NEGATIVE 08/12/2017 0627   PROTEINUR NEGATIVE 08/12/2017 0627   UROBILINOGEN 0.2 10/28/2009 0905   NITRITE NEGATIVE 08/12/2017 0627   LEUKOCYTESUR TRACE (A) 08/12/2017 0627    STUDIES: No results found.  She had bilateral diagnostic mammography on  10/18/2017 at [facility] showing breast density category B. There was no evidence of malignancy.   Since her last visit, she completed a chest CT on 08/26/2017 showing: No demonstrable pulmonary embolus. No thoracic aortic aneurysm. There is extensive aortic and great vessel calcification as well as foci of coronary artery calcification. No dissection evident; note that the contrast bolus is not sufficient to exclude this entity from an imaging standpoint. Pleural effusions bilaterally with compressive atelectasis in both lung bases. There may be superimposed pneumonia in the lung bases. There is bibasilar interstitial edema. Mildly enlarged pericarinal region lymph nodes. Small pericardial effusion. 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 Aortic Atherosclerosis (ICD10-I70.0).  She also completed a chest x-ray on 08/29/2017 showing: Interval slight decrease in degree of pulmonary vascular congestion. CT noted pleural effusions and basilar atelectasis not as well delineated on present plain film exam. Cardiomegaly. Aortic Atherosclerosis (ICD10-I70.0).   ASSESSMENT: 85 y.o. BRCA negative [place] resident   (1) status post bilateral lumpectomies without sentinel lymph node biopsy in August 2007 for a right-sided 7 mm, grade 2, invasive ductal carcinoma, and a left-sided 8 mm, grade 1, invasive ductal carcinoma.  Both tumors were ER positive, HER-2/neu negative, both with a low proliferation fraction.   (a) On Arimidex from September 2007 until May 2009, at which time we switched to tamoxifen primarily secondary to concerns regarding  osteoporosis, completing five years of antiestrogen September 2011.   (b)  Also received Zometa yearly, discontinued after 4 doses in 2011  (2)  Status post right breast upper outer quadrant biopsy 09/11/2014 4 a 4.2 cm area of ductal carcinoma in situ, grade 2 or 3,   with extravasated mucin, estrogen and progesterone receptor positive   (3) status post right lumpectomy 10/01/2014 for a pT1a pNX, stage IA invasive ductal carcinoma, grade 2, estrogen  and progesterone receptor positive, HER-2 negative, with an MIB-1 of 38%. Margins were close but negative   (4) she did not need adjuvant radiation   (5) opted against anti-estrogens  (a)  DEXA scan 10/31/2014 shows a T score of -1.8.  (6)  the BreastNext gene panel performed at [facility] 09/19/2014 showed no deleterious mutations in ATM, BARD1, BRCA1, BRCA2, BRIP1, CDH1, CHEK2, MRE11A, MUTYH, NBN, NF1, PALB2, PTEN, RAD50, RAD51C, RAD51D, or TP53.  PLAN: Lititia is now 4 years out from her right lumpectomy with no evidence of disease recurrence.  This is very favorable.  She had a little discomfort in an area of scarring in the left breast which is imaged in this note.  This requires only follow-up at this point.  Her mammogram is being postponed because of the current pandemic.  I believe it will be rescheduled to May.  When she sees me again a year from now she will be ready to "graduate" from follow-up.  Thankfully they have been able to obtain the apixaban for lower cost.  She is having no bleeding complications from this  We reviewed pandemic precautions and I also reviewed fall precautions with her   Vanice Rappa, Virgie Dad, MD  10/18/18 2:32 PM Medical Oncology and Hematology [facility] [place], [place] [address] Tel. [phone]    Fax. [phone]  I, Wilburn Mylar, am acting as scribe for Dr. Virgie Dad. Branae Crail.  I, Lurline Del MD, have reviewed the above documentation for accuracy and completeness, and I agree with the above.

## 2018-10-19 ENCOUNTER — Ambulatory Visit: Payer: MEDICARE | Admitting: Oncology

## 2018-10-19 ENCOUNTER — Other Ambulatory Visit: Payer: MEDICARE

## 2018-10-19 DIAGNOSIS — R2681 Unsteadiness on feet: Secondary | ICD-10-CM | POA: Diagnosis not present

## 2018-10-19 DIAGNOSIS — M6281 Muscle weakness (generalized): Secondary | ICD-10-CM | POA: Diagnosis not present

## 2018-10-19 NOTE — Telephone Encounter (Signed)
Received a fax from [facility] patient assistance, the application form was missing patient's signature date.Date entered and faxed back to fax # [phone].

## 2018-10-20 ENCOUNTER — Other Ambulatory Visit: Payer: Self-pay

## 2018-10-20 ENCOUNTER — Other Ambulatory Visit (INDEPENDENT_AMBULATORY_CARE_PROVIDER_SITE_OTHER): Payer: MEDICARE

## 2018-10-20 DIAGNOSIS — I10 Essential (primary) hypertension: Secondary | ICD-10-CM | POA: Diagnosis not present

## 2018-10-20 DIAGNOSIS — I5032 Chronic diastolic (congestive) heart failure: Secondary | ICD-10-CM

## 2018-10-20 LAB — BASIC METABOLIC PANEL
BUN: 24 mg/dL — ABNORMAL HIGH (ref 6–23)
CO2: 29 mEq/L (ref 19–32)
Calcium: 9.2 mg/dL (ref 8.4–10.5)
Chloride: 104 mEq/L (ref 96–112)
Creatinine, Ser: 1.18 mg/dL (ref 0.40–1.20)
GFR: 42.58 mL/min — ABNORMAL LOW (ref >60.00)
Glucose, Bld: 100 mg/dL — ABNORMAL HIGH (ref 70–99)
Potassium: 4.3 mEq/L (ref 3.5–5.1)
Sodium: 142 mEq/L (ref 135–145)

## 2018-11-07 NOTE — Telephone Encounter (Signed)
Received a fax from [facility] approving patient assistance for Eliquis.

## 2018-11-13 ENCOUNTER — Telehealth: Payer: Self-pay | Admitting: *Deleted

## 2018-11-13 ENCOUNTER — Telehealth: Payer: Self-pay | Admitting: Oncology

## 2018-11-13 NOTE — Telephone Encounter (Signed)
Patient had already seen GM - did not want her appointment on 4/30

## 2018-11-13 NOTE — Telephone Encounter (Signed)
Copied from [place] [phone]. Topic: General - Other >> Nov 13, 2018  3:43 PM Leward Quan A wrote: Reason for CRM: Patient called to as for Dr Jerilee Hoh assistant to give her a call back to get a description on the general health panel. Stated that she have a few questions that she would like answered please.  Ph# [phone]

## 2018-11-16 ENCOUNTER — Other Ambulatory Visit: Payer: MEDICARE

## 2018-11-16 ENCOUNTER — Ambulatory Visit: Payer: MEDICARE | Admitting: Oncology

## 2018-11-17 NOTE — Telephone Encounter (Signed)
Patient was charged for lab $50 and she would like to know if the codes can be changed?

## 2018-11-21 ENCOUNTER — Ambulatory Visit: Payer: MEDICARE | Admitting: Allergy and Immunology

## 2018-11-21 NOTE — Telephone Encounter (Signed)
Per chart pt had labs ordered under 80050 combo panel which Medicare does not cover. Will route to practice admin for possible charge reversal.   Rachel Bo - Please advise if charges for labs can be reversed as this was an in-office error. Thanks!

## 2018-11-28 ENCOUNTER — Ambulatory Visit (INDEPENDENT_AMBULATORY_CARE_PROVIDER_SITE_OTHER): Payer: MEDICARE | Admitting: Podiatry

## 2018-11-28 ENCOUNTER — Other Ambulatory Visit: Payer: Self-pay

## 2018-11-28 ENCOUNTER — Encounter: Payer: Self-pay | Admitting: Podiatry

## 2018-11-28 VITALS — Temp 97.5°F

## 2018-11-28 DIAGNOSIS — M79676 Pain in unspecified toe(s): Secondary | ICD-10-CM | POA: Diagnosis not present

## 2018-11-28 DIAGNOSIS — B351 Tinea unguium: Secondary | ICD-10-CM | POA: Diagnosis not present

## 2018-11-28 DIAGNOSIS — Q828 Other specified congenital malformations of skin: Secondary | ICD-10-CM | POA: Diagnosis not present

## 2018-11-28 DIAGNOSIS — D689 Coagulation defect, unspecified: Secondary | ICD-10-CM

## 2018-11-28 NOTE — Progress Notes (Signed)
She presents today chief complaint painful elongated toenails and calluses bilateral.  Objective: Pulses are palpable.  Neurologic system is intact reactive hyperkeratotic lesions plantar aspect of the forefoot bilaterally toenails are long thick yellow dystrophic-like mycotic.  Assessment: Pain limb secondary onychomycosis porokeratosis.  Plan: Debridement of toenails 1 through 5 bilateral debrided all reactive hyperkeratotic tissue bilaterally follow-up with her in 3 months

## 2018-12-05 ENCOUNTER — Other Ambulatory Visit: Payer: Self-pay

## 2018-12-05 MED ORDER — FUROSEMIDE 20 MG PO TABS: 20.0000 mg | ORAL_TABLET | Freq: Every day | ORAL | 2 refills | Status: DC

## 2018-12-18 ENCOUNTER — Other Ambulatory Visit: Payer: Self-pay | Admitting: Allergy and Immunology

## 2019-01-22 DIAGNOSIS — N6321 Unspecified lump in the left breast, upper outer quadrant: Secondary | ICD-10-CM | POA: Diagnosis not present

## 2019-01-22 DIAGNOSIS — Z853 Personal history of malignant neoplasm of breast: Secondary | ICD-10-CM | POA: Diagnosis not present

## 2019-01-22 DIAGNOSIS — M8589 Other specified disorders of bone density and structure, multiple sites: Secondary | ICD-10-CM | POA: Diagnosis not present

## 2019-01-22 DIAGNOSIS — L723 Sebaceous cyst: Secondary | ICD-10-CM | POA: Diagnosis not present

## 2019-01-22 DIAGNOSIS — R29890 Loss of height: Secondary | ICD-10-CM | POA: Diagnosis not present

## 2019-01-22 DIAGNOSIS — L989 Disorder of the skin and subcutaneous tissue, unspecified: Secondary | ICD-10-CM | POA: Diagnosis not present

## 2019-01-22 LAB — HM DEXA SCAN

## 2019-01-22 LAB — HM MAMMOGRAPHY

## 2019-01-24 ENCOUNTER — Encounter: Payer: Self-pay | Admitting: Internal Medicine

## 2019-02-01 ENCOUNTER — Encounter: Payer: Self-pay | Admitting: Internal Medicine

## 2019-02-03 ENCOUNTER — Other Ambulatory Visit: Payer: Self-pay | Admitting: Cardiovascular Disease

## 2019-02-12 ENCOUNTER — Other Ambulatory Visit: Payer: Self-pay

## 2019-02-12 MED ORDER — METOPROLOL SUCCINATE ER 50 MG PO TB24: 50.0000 mg | ORAL_TABLET | Freq: Every day | ORAL | 3 refills | Status: DC

## 2019-02-19 ENCOUNTER — Other Ambulatory Visit: Payer: Self-pay

## 2019-02-19 DIAGNOSIS — C50912 Malignant neoplasm of unspecified site of left female breast: Secondary | ICD-10-CM | POA: Diagnosis not present

## 2019-02-19 DIAGNOSIS — N6321 Unspecified lump in the left breast, upper outer quadrant: Secondary | ICD-10-CM | POA: Diagnosis not present

## 2019-02-20 ENCOUNTER — Ambulatory Visit: Payer: MEDICARE | Admitting: Allergy and Immunology

## 2019-02-20 DIAGNOSIS — D3132 Benign neoplasm of left choroid: Secondary | ICD-10-CM | POA: Diagnosis not present

## 2019-02-20 DIAGNOSIS — H43393 Other vitreous opacities, bilateral: Secondary | ICD-10-CM | POA: Diagnosis not present

## 2019-02-20 DIAGNOSIS — H353132 Nonexudative age-related macular degeneration, bilateral, intermediate dry stage: Secondary | ICD-10-CM | POA: Diagnosis not present

## 2019-02-20 DIAGNOSIS — H35373 Puckering of macula, bilateral: Secondary | ICD-10-CM | POA: Diagnosis not present

## 2019-02-22 DIAGNOSIS — H04123 Dry eye syndrome of bilateral lacrimal glands: Secondary | ICD-10-CM | POA: Diagnosis not present

## 2019-02-22 DIAGNOSIS — H524 Presbyopia: Secondary | ICD-10-CM | POA: Diagnosis not present

## 2019-02-22 DIAGNOSIS — H353132 Nonexudative age-related macular degeneration, bilateral, intermediate dry stage: Secondary | ICD-10-CM | POA: Diagnosis not present

## 2019-02-22 DIAGNOSIS — Z961 Presence of intraocular lens: Secondary | ICD-10-CM | POA: Diagnosis not present

## 2019-02-22 DIAGNOSIS — H02104 Unspecified ectropion of left upper eyelid: Secondary | ICD-10-CM | POA: Diagnosis not present

## 2019-02-25 ENCOUNTER — Other Ambulatory Visit: Payer: Self-pay | Admitting: Cardiovascular Disease

## 2019-02-27 ENCOUNTER — Other Ambulatory Visit: Payer: Self-pay

## 2019-02-27 ENCOUNTER — Encounter: Payer: Self-pay | Admitting: Oncology

## 2019-02-27 ENCOUNTER — Ambulatory Visit (INDEPENDENT_AMBULATORY_CARE_PROVIDER_SITE_OTHER): Payer: MEDICARE | Admitting: Podiatry

## 2019-02-27 DIAGNOSIS — Q828 Other specified congenital malformations of skin: Secondary | ICD-10-CM

## 2019-02-27 DIAGNOSIS — M79676 Pain in unspecified toe(s): Secondary | ICD-10-CM | POA: Diagnosis not present

## 2019-02-27 DIAGNOSIS — D689 Coagulation defect, unspecified: Secondary | ICD-10-CM

## 2019-02-27 DIAGNOSIS — B351 Tinea unguium: Secondary | ICD-10-CM | POA: Diagnosis not present

## 2019-02-28 ENCOUNTER — Encounter: Payer: Self-pay | Admitting: Podiatry

## 2019-02-28 NOTE — Progress Notes (Signed)
She presents today chief complaint of painful elongated toenails and calluses.  Objective: Vital signs are stable she is alert and oriented x3 pulses are palpable no open lesions or wounds.  Toenails are long thick yellow dystrophic-like mycotic multiple reactive hyperkeratotic lesions plantar aspect of the foot none open.  Assessment: Pain in limb secondary to onychomycosis and porokeratosis.  Plan: Debridement of all reactive hyperkeratotic tissue debridement of toenails 1 through 5 bilateral.  Follow-up with her in 3 months.

## 2019-03-02 ENCOUNTER — Ambulatory Visit: Payer: Self-pay | Admitting: Surgery

## 2019-03-02 ENCOUNTER — Telehealth: Payer: Self-pay | Admitting: *Deleted

## 2019-03-02 DIAGNOSIS — Z170 Estrogen receptor positive status [ER+]: Secondary | ICD-10-CM

## 2019-03-02 DIAGNOSIS — C50912 Malignant neoplasm of unspecified site of left female breast: Secondary | ICD-10-CM | POA: Diagnosis not present

## 2019-03-02 NOTE — Telephone Encounter (Signed)
   [facility] Pre-operative Risk Assessment    Request for surgical clearance:  1. What type of surgery is being performed? BREAST LUMPECTOMY  When is this surgery scheduled? TBD   2. What type of clearance is required (medical clearance vs. Pharmacy clearance to hold med vs. Both)? BOTH  3. Are there any medications that need to be held prior to surgery and how long?ELIQUIS 2 DAYS PREOPERATELY  4. Practice name and name of physician performing surgery? [facility]  5. What is your office phone number [phone]   7.   What is your office fax number [phone] ATTN-MICHELE BROOKS [place]  8.   Anesthesia type (None, local, MAC, general) ? GENERAL   [name] 03/02/2019, 1:42 PM  _________________________________________________________________   (provider comments below)

## 2019-03-02 NOTE — Telephone Encounter (Signed)
Patient with diagnosis of ATRIAL FIBRILLATION on ELIQUIS for anticoagulation.    Procedure: BREAST LUMPECTOMY Date of procedure: TBD  CHADS2-VASc score of  6 (CHF, HTN, AGE X2,CAD, female)  CrCl = 33ml/min on 10/2018 Platelet count = 177  Per office protocol, patient can hold ELIQUIS for 2 days prior to procedure.   patient WILL NOT need bridging with Lovenox (enoxaparin) around procedure.

## 2019-03-02 NOTE — H&P (View-Only) (Signed)
[name] Documented: 03/02/2019 10:09 AM Location: [facility] Patient #: [id number] DOB: 07/18/1922 Married / Language: English / Race: White Female  History of Present Illness (Khaliah Barnick A. Racine Erby MD; 03/02/2019 10:48 AM) Patient words: Patient presents with chief complaint of mass in left breast. She is a 85-year-old female who underwent a right breast lumpectomy in 2016 for DCIS. She has developed a left breast cancer that has been managed medically with tamoxifen. She is now developed some skin involvement over the mass without ulceration. She has progressed on antiestrogen therapy she was sent for consideration of a left breast lumpectomy for local control. Given her advanced age, she is in relatively good health. She had a laparoscopic cholecystectomy last year and suffered a myocardial infarction 3 weeks later but seems to have recovered from that. She has chronic atrial fibrillation and takes anticoagulation for that. She has concerns that the mass was ulcerated through her skin eventually. She is able to operate independently with her husband.  ADDITIONAL INFORMATION: PROGNOSTIC INDICATORS Results: IMMUNOHISTOCHEMICAL AND MORPHOMETRIC ANALYSIS PERFORMED MANUALLY The tumor cells are NEGATIVE for Her2 (1+). Estrogen Receptor: 100%, POSITIVE, STRONG STAINING INTENSITY Progesterone Receptor: 100%, POSITIVE, STRONG STAINING INTENSITY Proliferation Marker Ki67: 15% REFERENCE RANGE ESTROGEN RECEPTOR NEGATIVE 0% POSITIVE =>1% REFERENCE RANGE PROGESTERONE RECEPTOR NEGATIVE 0% POSITIVE =>1% All controls stained appropriately JOSHUA KISH MD Pathologist, Electronic Signature ( Signed 02/21/2019) FINAL DIAGNOSIS Diagnosis Breast, left, needle core biopsy, mass - INVASIVE DUCTAL CARCINOMA, SEE COMMENT. 1 of 2 FINAL for [place], Antrice B ([id number]) Microscopic Comment The carcinoma appears grade 1 and measures 10 mm in     status post bilateral lumpectomies  without sentinel lymph node biopsy in August 2007 for a right-sided 7 mm, grade 2, invasive ductal carcinoma, and a left-sided 8 mm, grade 1, invasive ductal carcinoma. Both tumors were ER positive, HER-2/neu negative, both with a low proliferation fraction. (a) On Arimidex from September 2007 until May 2009, at which time we switched to tamoxifen primarily secondary to concerns regarding osteoporosis, completing five years of antiestrogen September 2011. (b) Also received Zometa yearly, discontinued after 4 doses in 2011  (2) Status post right breast upper outer quadrant biopsy 09/11/2014 4 a 4.2 cm area of ductal carcinoma in situ, grade 2 or 3, with extravasated mucin, estrogen and progesterone receptor positive  (3) status post right lumpectomy 10/01/2014 for a pT1a pNX, stage IA invasive ductal carcinoma, grade 2, estrogen and progesterone receptor positive, HER-2 negative, with an MIB-1 of 38%. Margins were close but negative  (4) she did not need adjuvant radiation  (5) opted against anti-estrogens (a) DEXA scan 10/31/2014 shows a T score of -1.8.  The patient is a 85 year old female.   Past Surgical History (Michelle R. Brooks, CMA; 03/02/2019 10:10 AM) Breast Mass; Local Excision Bilateral. Cataract Surgery Bilateral. Foot Surgery Right. Gallbladder Surgery - Laparoscopic Tonsillectomy  Diagnostic Studies History (Michelle R. Brooks, CMA; 03/02/2019 10:10 AM) Colonoscopy never Mammogram within last year Pap Smear >5 years ago  Allergies (Michelle R. Brooks, CMA; 03/02/2019 10:10 AM) Celecoxib *CHEMICALS* Swelling. No Known Drug Allergies [03/02/2019]: (Marked as Inactive)  Medication History (Michelle R. Brooks, CMA; 03/02/2019 10:11 AM) Atorvastatin Calcium (40MG Tablet, Oral) Active. Eliquis (5MG Tablet, Oral) Active. Furosemide (20MG Tablet, Oral) Active. Ipratropium Bromide (0.06% Solution, Nasal) Active. Ketoconazole  (2% Shampoo, External) Active. Metoprolol Succinate ER (50MG Tablet ER 24HR, Oral) Active. Montelukast Sodium (10MG Tablet, Oral) Active. Multi-Vitamin (Oral) Active.  Social History (Michelle R. Brooks, CMA; 03/02/2019 10:10 AM)   Tobacco use Never smoker.  Family History Sharyn Lull R. Rolena Infante, CMA; 03/02/2019 10:10 AM) Breast Cancer Family Members In General, Mother. Heart Disease Father.  Pregnancy / Birth History Sharyn Lull R. Rolena Infante, CMA; 03/02/2019 10:10 AM) Age of menopause 27-50 Gravida 0  Other Problems Sharyn Lull R. Brooks, CMA; 03/02/2019 10:10 AM) Atrial Fibrillation Breast Cancer Umbilical Hernia Repair     Review of Systems [facility] R. Brooks CMA; 03/02/2019 10:10 AM) General Not Present- Appetite Loss, Chills, Fatigue, Fever, Night Sweats, Weight Gain and Weight Loss. HEENT Present- Hearing Loss and Wears glasses/contact lenses. Not Present- Earache, Hoarseness, Nose Bleed, Oral Ulcers, Ringing in the Ears, Seasonal Allergies, Sinus Pain, Sore Throat, Visual Disturbances and Yellow Eyes. Respiratory Not Present- Bloody sputum, Chronic Cough, Difficulty Breathing, Snoring and Wheezing. Breast Present- Breast Mass. Not Present- Breast Pain, Nipple Discharge and Skin Changes. Cardiovascular Not Present- Chest Pain, Difficulty Breathing Lying Down, Leg Cramps, Palpitations, Rapid Heart Rate, Shortness of Breath and Swelling of Extremities. Female Genitourinary Not Present- Frequency, Nocturia, Painful Urination, Pelvic Pain and Urgency. Neurological Not Present- Decreased Memory, Fainting, Headaches, Numbness, Seizures, Tingling, Tremor, Trouble walking and Weakness. Psychiatric Not Present- Anxiety, Bipolar, Change in Sleep Pattern, Depression, Fearful and Frequent crying. Endocrine Not Present- Cold Intolerance, Excessive Hunger, Hair Changes, Heat Intolerance, Hot flashes and New Diabetes. Hematology Present- Blood Thinners. Not Present- Easy Bruising, Excessive  bleeding, Gland problems, HIV and Persistent Infections.  Vitals [employer] R. Brooks CMA; 03/02/2019 10:09 AM) 03/02/2019 10:09 AM Weight: 162.38 lb Height: 63in Body Surface Area: 1.77 m Body Mass Index: 28.76 kg/m  Pulse: 64 (Regular)  BP: 136/74 (Sitting, Left Arm, Standard)        Physical Exam (Yussuf Sawyers A. Khayla Koppenhaver MD; 03/02/2019 10:47 AM)  General Mental Status-Alert. General Appearance-Consistent with stated age. Hydration-Well hydrated. Voice-Normal.  Breast Breast - Left-Symmetric, Non Tender, No Biopsy scars, no Dimpling - Left, No Inflammation, No Lumpectomy scars, No Mastectomy scars, No Peau d' Orange. Breast - Right-Symmetric, Non Tender, No Biopsy scars, no Dimpling - Right, No Inflammation, No Lumpectomy scars, No Mastectomy scars, No Peau d' Orange. Breast Lump-No Palpable Breast Mass. Note: Left breast mass. The just lateral to the nipple with skin changes without redness or ulceration. Bruising at the site noted. Right breast shows postlumpectomy scar is without abnormality.  Neurologic Neurologic evaluation reveals -alert and oriented x 3 with no impairment of recent or remote memory. Mental Status-Normal.  Musculoskeletal Normal Exam - Left-Upper Extremity Strength Normal and Lower Extremity Strength Normal. Normal Exam - Right-Upper Extremity Strength Normal and Lower Extremity Strength Normal.  Lymphatic Head & Neck  General Head & Neck Lymphatics: Bilateral - Description - Normal. Axillary  General Axillary Region: Bilateral - Description - Normal. Tenderness - Non Tender.    Assessment & Plan (Nolan Tuazon A. Donetta Isaza MD; 03/02/2019 10:49 AM)  BREAST CANCER, STAGE 1, LEFT (C50.912) Impression: Patient has been on anti-estrogens but has some progression of her left breast cancer now through skin in the left upper outer quadrant. There is no ulceration but this is progressing on anti-estrogens. Given her advanced age, she is  being managed medically but this point in time I think lumpectomy feasible for local control and overall her health is quite good given her advanced age. I discussed this with the husband and daughter today and they both are in agreement as is the patient. We will schedule for left breast seed lumpectomy. Obtain cardiac clearance  Risk of lumpectomy include bleeding, infection, seroma, more surgery, use of seed/wire, wound care, cosmetic  deformity and the need for other treatments, death , blood clots, death. Pt agrees to proceed.  Current Plans You are being scheduled for surgery- Our schedulers will call you.  You should hear from our office's scheduling department within 5 working days about the location, date, and time of surgery. We try to make accommodations for patient's preferences in scheduling surgery, but sometimes the OR schedule or the surgeon's schedule prevents [place] from making those accommodations.  If you have not heard from our office [phone]) in 5 working days, call the office and ask for your surgeon's nurse.  If you have other questions about your diagnosis, plan, or surgery, call the office and ask for your surgeon's nurse.  Pt Education - [facility] Breast Cancer Information Given - Alight "Breast Journey" Package We discussed the staging and pathophysiology of breast cancer. We discussed all of the different options for treatment for breast cancer including surgery, chemotherapy, radiation therapy, Herceptin, and antiestrogen therapy. We discussed a sentinel lymph node biopsy as she does not appear to having lymph node involvement right now. We discussed the performance of that with injection of radioactive tracer and blue dye. We discussed that she would have an incision underneath her axillary hairline. We discussed that there is a bout a 10-20% chance of having a positive node with a sentinel lymph node biopsy and we will await the permanent pathology to make any other  first further decisions in terms of her treatment. One of these options might be to return to the operating room to perform an axillary lymph node dissection. We discussed about a 1-2% risk lifetime of chronic shoulder pain as well as lymphedema associated with a sentinel lymph node biopsy. We discussed the options for treatment of the breast cancer which included lumpectomy versus a mastectomy. We discussed the performance of the lumpectomy with a wire placement. We discussed a 10-20% chance of a positive margin requiring reexcision in the operating room. We also discussed that she may need radiation therapy or antiestrogen therapy or both if she undergoes lumpectomy. We discussed the mastectomy and the postoperative care for that as well. We discussed that there is no difference in her survival whether she undergoes lumpectomy with radiation therapy or antiestrogen therapy versus a mastectomy. There is a slight difference in the local recurrence rate being 3-5% with lumpectomy and about 1% with a mastectomy. We discussed the risks of operation including bleeding, infection, possible reoperation. She understands her further therapy will be based on what her stages at the time of her operation.  Pt Education - [facility] breast cancer surgery: discussed with patient and provided information. Pt Education - [facility] Breast Biopsy HCI: discussed with patient and provided information.

## 2019-03-02 NOTE — Telephone Encounter (Signed)
   Primary Cardiologist: Sanda Klein, MD  Chart reviewed and patient contacted today by phone as part of pre-operative protocol coverage. Given past medical history and time since last visit, based on ACC/AHA guidelines, [name] would be at acceptable risk for the planned procedure without further cardiovascular testing.   OK to hold Eliquis 2 days pre op.  I will route this recommendation to the requesting party via Epic fax function and remove from pre-op pool.  Please call with questions.  Kerin Ransom, PA-C 03/02/2019, 2:28 PM

## 2019-03-02 NOTE — H&P (Signed)
[name] Documented: 03/02/2019 10:09 AM Location: [facility] Patient #: [id number] DOB: August 18, 1921 Married / Language: [name] / Race: White Female  History of Present Illness [name]; 03/02/2019 10:48 AM) Patient words: Patient presents with chief complaint of mass in left breast. She is a 85 year old female who underwent a right breast lumpectomy in 2016 for DCIS. She has developed a left breast cancer that has been managed medically with tamoxifen. She is now developed some skin involvement over the mass without ulceration. She has progressed on antiestrogen therapy she was sent for consideration of a left breast lumpectomy for local control. Given her advanced age, she is in relatively good health. She had a laparoscopic cholecystectomy last year and suffered a myocardial infarction 3 weeks later but seems to have recovered from that. She has chronic atrial fibrillation and takes anticoagulation for that. She has concerns that the mass was ulcerated through her skin eventually. She is able to operate independently with her husband.  ADDITIONAL INFORMATION: PROGNOSTIC INDICATORS Results: IMMUNOHISTOCHEMICAL AND MORPHOMETRIC ANALYSIS PERFORMED MANUALLY The tumor cells are NEGATIVE for Her2 (1+). Estrogen Receptor: 100%, POSITIVE, STRONG STAINING INTENSITY Progesterone Receptor: 100%, POSITIVE, STRONG STAINING INTENSITY Proliferation Marker Ki67: 15% REFERENCE RANGE ESTROGEN RECEPTOR NEGATIVE 0% POSITIVE =>1% REFERENCE RANGE PROGESTERONE RECEPTOR NEGATIVE 0% POSITIVE =>1% All controls stained appropriately Taylor Cutter Marks Pathologist, Electronic Signature ( Signed 02/21/2019) FINAL DIAGNOSIS Diagnosis Breast, left, needle core biopsy, mass - INVASIVE DUCTAL CARCINOMA, SEE COMMENT. 1 of 2 FINAL for [name], [name] [phone]) Microscopic Comment The carcinoma appears grade 1 and measures 10 mm in     status post bilateral lumpectomies  without sentinel lymph node biopsy in August 2007 for a right-sided 7 mm, grade 2, invasive ductal carcinoma, and a left-sided 8 mm, grade 1, invasive ductal carcinoma. Both tumors were ER positive, HER-2/neu negative, both with a low proliferation fraction. (a) On Arimidex from September 2007 until May 2009, at which time we switched to tamoxifen primarily secondary to concerns regarding osteoporosis, completing five years of antiestrogen September 2011. (b) Also received Zometa yearly, discontinued after 4 doses in 2011  (2) Status post right breast upper outer quadrant biopsy 09/11/2014 4 a 4.2 cm area of ductal carcinoma in situ, grade 2 or 3, with extravasated mucin, estrogen and progesterone receptor positive  (3) status post right lumpectomy 10/01/2014 for a pT1a pNX, stage IA invasive ductal carcinoma, grade 2, estrogen and progesterone receptor positive, HER-2 negative, with an MIB-1 of 38%. Margins were close but negative  (4) she did not need adjuvant radiation  (5) opted against anti-estrogens (a) DEXA scan 10/31/2014 shows a T score of -1.8.  The patient is a 85 year old female.   Past Surgical History [name], CMA; 03/02/2019 10:10 AM) Breast Mass; Local Excision Bilateral. Cataract Surgery Bilateral. Foot Surgery Right. Gallbladder Surgery - Laparoscopic Tonsillectomy  Diagnostic Studies History [name], CMA; 03/02/2019 10:10 AM) Colonoscopy never Mammogram within last year Pap Smear >5 years ago  Allergies [name], CMA; 03/02/2019 10:10 AM) Celecoxib *CHEMICALS* Swelling. No Known Drug Allergies [03/02/2019]: (Marked as Inactive)  Medication History ([name], CMA; 03/02/2019 10:11 AM) Atorvastatin Calcium (40MG Tablet, Oral) Active. Eliquis (5MG Tablet, Oral) Active. Furosemide (20MG Tablet, Oral) Active. Ipratropium Bromide (0.06% Solution, Nasal) Active. Ketoconazole  (2% Shampoo, External) Active. Metoprolol Succinate ER (50MG Tablet ER 24HR, Oral) Active. Montelukast Sodium (10MG Tablet, Oral) Active. Multi-Vitamin (Oral) Active.  Social History [name], CMA; 03/02/2019 10:10 AM)  Tobacco use Never smoker.  Family History [name], CMA; 03/02/2019 10:10 AM) Breast Cancer Family Members In General, Mother. Heart Disease Father.  Pregnancy / Birth History [name], CMA; 03/02/2019 10:10 AM) Age of menopause 39-50 Gravida 0  Other Problems [name], CMA; 03/02/2019 10:10 AM) Atrial Fibrillation Breast Cancer Umbilical Hernia Repair     Review of Systems [facility] R. Marks CMA; 03/02/2019 10:10 AM) General Not Present- Appetite Loss, Chills, Fatigue, Fever, Night Sweats, Weight Gain and Weight Loss. HEENT Present- Hearing Loss and Wears glasses/contact lenses. Not Present- Earache, Hoarseness, Nose Bleed, Oral Ulcers, Ringing in the Ears, Seasonal Allergies, Sinus Pain, Sore Throat, Visual Disturbances and Yellow Eyes. Respiratory Not Present- Bloody sputum, Chronic Cough, Difficulty Breathing, Snoring and Wheezing. Breast Present- Breast Mass. Not Present- Breast Pain, Nipple Discharge and Skin Changes. Cardiovascular Not Present- Chest Pain, Difficulty Breathing Lying Down, Leg Cramps, Palpitations, Rapid Heart Rate, Shortness of Breath and Swelling of Extremities. Female Genitourinary Not Present- Frequency, Nocturia, Painful Urination, Pelvic Pain and Urgency. Neurological Not Present- Decreased Memory, Fainting, Headaches, Numbness, Seizures, Tingling, Tremor, Trouble walking and Weakness. Psychiatric Not Present- Anxiety, Bipolar, Change in Sleep Pattern, Depression, Fearful and Frequent crying. Endocrine Not Present- Cold Intolerance, Excessive Hunger, Hair Changes, Heat Intolerance, Hot flashes and New Diabetes. Hematology Present- Blood Thinners. Not Present- Easy Bruising, Excessive  bleeding, Gland problems, HIV and Persistent Infections.  Vitals [name] CMA; 03/02/2019 10:09 AM) 03/02/2019 10:09 AM Weight: 162.38 lb Height: 63in Body Surface Area: 1.77 m Body Mass Index: 28.76 kg/m  Pulse: 64 (Regular)  BP: 136/74 (Sitting, Left Arm, Standard)        Physical Exam ([name]; 03/02/2019 10:47 AM)  General Mental Status-Alert. General Appearance-Consistent with stated age. Hydration-Well hydrated. Voice-Normal.  Breast Breast - Left-Symmetric, Non Tender, No Biopsy scars, no Dimpling - Left, No Inflammation, No Lumpectomy scars, No Mastectomy scars, No Peau d' Orange. Breast - Right-Symmetric, Non Tender, No Biopsy scars, no Dimpling - Right, No Inflammation, No Lumpectomy scars, No Mastectomy scars, No Peau d' Orange. Breast Lump-No Palpable Breast Mass. Note: Left breast mass. The just lateral to the nipple with skin changes without redness or ulceration. Bruising at the site noted. Right breast shows postlumpectomy scar is without abnormality.  Neurologic Neurologic evaluation reveals -alert and oriented x 3 with no impairment of recent or remote memory. Mental Status-Normal.  Musculoskeletal Normal Exam - Left-Upper Extremity Strength Normal and Lower Extremity Strength Normal. Normal Exam - Right-Upper Extremity Strength Normal and Lower Extremity Strength Normal.  Lymphatic Head & Neck  General Head & Neck Lymphatics: Bilateral - Description - Normal. Axillary  General Axillary Region: Bilateral - Description - Normal. Tenderness - Non Tender.    Assessment & Plan ([name]; 03/02/2019 10:49 AM)  BREAST CANCER, STAGE 1, LEFT (C50.912) Impression: Patient has been on anti-estrogens but has some progression of her left breast cancer now through skin in the left upper outer quadrant. There is no ulceration but this is progressing on anti-estrogens. Given her advanced age, she is  being managed medically but this point in time I think lumpectomy feasible for local control and overall her health is quite good given her advanced age. I discussed this with the husband and daughter today and they both are in agreement as is the patient. We will schedule for left breast seed lumpectomy. Obtain cardiac clearance  Risk of lumpectomy include bleeding, infection, seroma, more surgery, use of seed/wire, wound care, cosmetic  deformity and the need for other treatments, death , blood clots, death. Pt agrees to proceed.  Current Plans You are being scheduled for surgery- Our schedulers will call you.  You should hear from our office's scheduling department within 5 working days about the location, date, and time of surgery. We try to make accommodations for patient's preferences in scheduling surgery, but sometimes the OR schedule or the surgeon's schedule prevents [place] from making those accommodations.  If you have not heard from our office [phone]) in 5 working days, call the office and ask for your surgeon's nurse.  If you have other questions about your diagnosis, plan, or surgery, call the office and ask for your surgeon's nurse.  Pt Education - [facility] Breast Cancer Information Given - Alight "Breast Journey" Package We discussed the staging and pathophysiology of breast cancer. We discussed all of the different options for treatment for breast cancer including surgery, chemotherapy, radiation therapy, Herceptin, and antiestrogen therapy. We discussed a sentinel lymph node biopsy as she does not appear to having lymph node involvement right now. We discussed the performance of that with injection of radioactive tracer and blue dye. We discussed that she would have an incision underneath her axillary hairline. We discussed that there is a bout a 10-20% chance of having a positive node with a sentinel lymph node biopsy and we will await the permanent pathology to make any other  first further decisions in terms of her treatment. One of these options might be to return to the operating room to perform an axillary lymph node dissection. We discussed about a 1-2% risk lifetime of chronic shoulder pain as well as lymphedema associated with a sentinel lymph node biopsy. We discussed the options for treatment of the breast cancer which included lumpectomy versus a mastectomy. We discussed the performance of the lumpectomy with a wire placement. We discussed a 10-20% chance of a positive margin requiring reexcision in the operating room. We also discussed that she may need radiation therapy or antiestrogen therapy or both if she undergoes lumpectomy. We discussed the mastectomy and the postoperative care for that as well. We discussed that there is no difference in her survival whether she undergoes lumpectomy with radiation therapy or antiestrogen therapy versus a mastectomy. There is a slight difference in the local recurrence rate being 3-5% with lumpectomy and about 1% with a mastectomy. We discussed the risks of operation including bleeding, infection, possible reoperation. She understands her further therapy will be based on what her stages at the time of her operation.  Pt Education - [facility] breast cancer surgery: discussed with patient and provided information. Pt Education - [facility] Breast Biopsy HCI: discussed with patient and provided information.

## 2019-03-05 ENCOUNTER — Other Ambulatory Visit: Payer: Self-pay

## 2019-03-05 NOTE — Telephone Encounter (Signed)
Refill

## 2019-03-14 ENCOUNTER — Other Ambulatory Visit: Payer: Self-pay | Admitting: Allergy and Immunology

## 2019-03-15 NOTE — Pre-Procedure Instructions (Signed)
[id number] - [place], [place] [address] Phone: [phone] Fax: [phone]      Your procedure is scheduled on  03-22-19  Report to [facility] Main Entrance "A" at 0530A.M., and check in at the Admitting office.  Call this number if you have problems the morning of surgery:  [phone]  Call [phone] if you have any questions prior to your surgery date Monday-Friday 8am-4pm    Remember:  Do not eat or drink after midnight the night before your surgery  You may drink clear liquids until 0430 AM the morning of your surgery.   Clear liquids allowed are: Water, Non-Citrus Juices (without pulp), Carbonated Beverages, Clear Tea, Black Coffee Only, and Gatorade   Take these medicines the morning of surgery with A SIP OF WATER : atorvastatin (LIPITOR) ipratropium (ATROVENT) Polyethyl Glycol-Propyl Glycol (SYSTANE OP)as needed  Follow your surgeon's instructions on when to stop ELIQUIS.  If no instructions were given by your surgeon then you will need to call the office to get those instructions.    7 days prior to surgery STOP taking any Aspirin (unless otherwise instructed by your surgeon), Aleve, Naproxen, Ibuprofen, Motrin, Advil, Goody's, BC's, all herbal medications, fish oil, and all vitamins.   The Morning of Surgery  Do not wear jewelry, make-up or nail polish.  Do not wear lotions, powders, or perfumes/colognes, or deodorant  Do not shave 48 hours prior to surgery.    Do not bring valuables to the hospital.  [facility] is not responsible for any belongings or valuables.  If you are a smoker, DO NOT Smoke 24 hours prior to surgery IF you wear a CPAP at night please bring your mask, tubing, and machine the morning of surgery   Remember that you must have someone to transport you home after your surgery, and remain with you for 24 hours if you are discharged the same day.  Contacts, glasses, hearing aids, dentures  or bridgework may not be worn into surgery.    Leave your suitcase in the car.  After surgery it may be brought to your room.  For patients admitted to the hospital, discharge time will be determined by your treatment team.  Patients discharged the day of surgery will not be allowed to drive home.    Special instructions:   [place]- Preparing For Surgery  Before surgery, you can play an important role. Because skin is not sterile, your skin needs to be as free of germs as possible. You can reduce the number of germs on your skin by washing with CHG (chlorahexidine gluconate) Soap before surgery.  CHG is an antiseptic cleaner which kills germs and bonds with the skin to continue killing germs even after washing.    Oral Hygiene is also important to reduce your risk of infection.  Remember - BRUSH YOUR TEETH THE MORNING OF SURGERY WITH YOUR REGULAR TOOTHPASTE  Please do not use if you have an allergy to CHG or antibacterial soaps. If your skin becomes reddened/irritated stop using the CHG.  Do not shave (including legs and underarms) for at least 48 hours prior to first CHG shower. It is OK to shave your face.  Please follow these instructions carefully.   1. Shower the NIGHT BEFORE SURGERY and the MORNING OF SURGERY with CHG Soap.   2. If you chose to wash your hair, wash your hair first as usual with your normal shampoo.  3. After you shampoo, rinse  your hair and body thoroughly to remove the shampoo.  4. Use CHG as you would any other liquid soap. You can apply CHG directly to the skin and wash gently with a scrungie or a clean washcloth.   5. Apply the CHG Soap to your body ONLY FROM THE NECK DOWN.  Do not use on open wounds or open sores. Avoid contact with your eyes, ears, mouth and genitals (private parts). Wash Face and genitals (private parts)  with your normal soap.   6. Wash thoroughly, paying special attention to the area where your surgery will be  performed.  7. Thoroughly rinse your body with warm water from the neck down.  8. DO NOT shower/wash with your normal soap after using and rinsing off the CHG Soap.  9. Pat yourself dry with a CLEAN TOWEL.  10. Wear CLEAN PAJAMAS to bed the night before surgery, wear comfortable clothes the morning of surgery  11. Place CLEAN SHEETS on your bed the night of your first shower and DO NOT SLEEP WITH PETS.    Day of Surgery:  Do not apply any deodorants/lotions. Please shower the morning of surgery with the CHG soap  Please wear clean clothes to the hospital/surgery center.   Remember to brush your teeth WITH YOUR REGULAR TOOTHPASTE.   Please read over the  fact sheets that you were given.

## 2019-03-16 ENCOUNTER — Encounter (HOSPITAL_COMMUNITY): Payer: Self-pay

## 2019-03-16 ENCOUNTER — Encounter (HOSPITAL_COMMUNITY)
Admission: RE | Admit: 2019-03-16 | Discharge: 2019-03-16 | Disposition: A | Discharge status: Home / Self Care | Payer: MEDICARE | Source: Ambulatory Visit | Attending: Surgery | Admitting: Surgery

## 2019-03-16 ENCOUNTER — Other Ambulatory Visit: Payer: Self-pay

## 2019-03-16 DIAGNOSIS — C50912 Malignant neoplasm of unspecified site of left female breast: Secondary | ICD-10-CM

## 2019-03-16 DIAGNOSIS — Z20828 Contact with and (suspected) exposure to other viral communicable diseases: Secondary | ICD-10-CM | POA: Diagnosis not present

## 2019-03-16 DIAGNOSIS — Z170 Estrogen receptor positive status [ER+]: Secondary | ICD-10-CM | POA: Insufficient documentation

## 2019-03-16 DIAGNOSIS — Z01812 Encounter for preprocedural laboratory examination: Secondary | ICD-10-CM | POA: Insufficient documentation

## 2019-03-16 HISTORY — DX: Cardiac arrhythmia, unspecified: I49.9

## 2019-03-16 LAB — CBC WITH DIFFERENTIAL/PLATELET
Abs Immature Granulocytes: 0.02 10*3/uL (ref 0.00–0.07)
Basophils Absolute: 0 10*3/uL (ref 0.0–0.1)
Basophils Relative: 1 %
Eosinophils Absolute: 0.1 10*3/uL (ref 0.0–0.5)
Eosinophils Relative: 1 %
HCT: 37.9 % (ref 36.0–46.0)
Hemoglobin: 12.1 g/dL (ref 12.0–15.0)
Immature Granulocytes: 0 %
Lymphocytes Relative: 23 %
Lymphs Abs: 1.1 10*3/uL (ref 0.7–4.0)
MCH: 30.9 pg (ref 26.0–34.0)
MCHC: 31.9 g/dL (ref 30.0–36.0)
MCV: 96.9 fL (ref 80.0–100.0)
Monocytes Absolute: 0.5 10*3/uL (ref 0.1–1.0)
Monocytes Relative: 10 %
Neutro Abs: 3.3 10*3/uL (ref 1.7–7.7)
Neutrophils Relative %: 65 %
Platelets: 175 10*3/uL (ref 150–400)
RBC: 3.91 MIL/uL (ref 3.87–5.11)
RDW: 13 % (ref 11.5–15.5)
WBC: 5 10*3/uL (ref 4.0–10.5)
nRBC: 0 % (ref 0.0–0.2)

## 2019-03-16 LAB — COMPREHENSIVE METABOLIC PANEL
ALT: 21 U/L (ref 0–44)
AST: 23 U/L (ref 15–41)
Albumin: 3.6 g/dL (ref 3.5–5.0)
Alkaline Phosphatase: 105 U/L (ref 38–126)
Anion gap: 10 (ref 5–15)
BUN: 28 mg/dL — ABNORMAL HIGH (ref 8–23)
CO2: 27 mmol/L (ref 22–32)
Calcium: 9 mg/dL (ref 8.9–10.3)
Chloride: 106 mmol/L (ref 98–111)
Creatinine, Ser: 1.18 mg/dL — ABNORMAL HIGH (ref 0.44–1.00)
GFR calc Af Amer: 45 mL/min — ABNORMAL LOW (ref >60)
GFR calc non Af Amer: 39 mL/min — ABNORMAL LOW (ref >60)
Glucose, Bld: 104 mg/dL — ABNORMAL HIGH (ref 70–99)
Potassium: 4.2 mmol/L (ref 3.5–5.1)
Sodium: 143 mmol/L (ref 135–145)
Total Bilirubin: 0.6 mg/dL (ref 0.3–1.2)
Total Protein: 5.9 g/dL — ABNORMAL LOW (ref 6.5–8.1)

## 2019-03-16 NOTE — Progress Notes (Addendum)
  Coronavirus Screening Scheduled for COVID test on 03/19/19 Have you experienced the following symptoms:  Cough yes/no: No Fever (>100.50F)  yes/no: No Runny nose yes/no: No Sore throat yes/no: No Difficulty breathing/shortness of breath  yes/no: No Loss of smell or taste-no Have you or a family member traveled in the last 14 days and where? yes/no: No  PCP - Dr Lelon Frohlich  Cardiologist - Dr Sallyanne Kuster , Dani Ganser  Oncology-Dr Magrinat,Gustav  GI- Dr [name]  Chest x-ray - NA  EKG - 06-12-18 Epic  Stress Test - denies  ECHO - denies  Cardiac Cath - denies  AICD-denies PM-denies LOOP-denies  Sleep Study - denies CPAP - NA LABS-  ASA-denies ELIQUIS-Will stop 2 days prior to DOS.  Seed placement-scheduled 03/21/19  ERAS-NA  HA1C-denies Fasting Blood Sugar -  Checks Blood Sugar _0____ times a day  Anesthesia-H/O chronic afib., MI.  Pt denies having chest pain, palpitations,sob, or fever at this time. All instructions explained to the pt, with a verbal understanding of the material. Pt agrees to go over the instructions while at home for a better understanding. Pt also instructed to self quarantine after being tested for COVID-19. The opportunity to ask questions was provided.

## 2019-03-16 NOTE — Anesthesia Preprocedure Evaluation (Addendum)
Anesthesia Evaluation  Patient identified by MRN, date of birth, ID band Patient awake    Reviewed: Allergy & Precautions, H&P , NPO status , Patient's Chart, lab work & pertinent test results, reviewed documented beta blocker date and time   Airway Mallampati: II  TM Distance: >3 FB Neck ROM: Full    Dental no notable dental hx. (+) Teeth Intact, Dental Advisory Given   Pulmonary neg pulmonary ROS,    Pulmonary exam normal breath sounds clear to auscultation       Cardiovascular Exercise Tolerance: Good hypertension, Pt. on medications and Pt. on home beta blockers + CAD and + Past MI  negative cardio ROS  + dysrhythmias Atrial Fibrillation  Rhythm:Regular Rate:Normal     Neuro/Psych negative neurological ROS  negative psych ROS   GI/Hepatic negative GI ROS, Neg liver ROS, GERD  Controlled,  Endo/Other  negative endocrine ROS  Renal/GU negative Renal ROS  negative genitourinary   Musculoskeletal  (+) Arthritis , Osteoarthritis,    Abdominal   Peds  Hematology negative hematology ROS (+)   Anesthesia Other Findings   Reproductive/Obstetrics negative OB ROS                           Anesthesia Physical Anesthesia Plan  ASA: III  Anesthesia Plan: General   Post-op Pain Management:    Induction: Intravenous  PONV Risk Score and Plan: 4 or greater and Ondansetron, Dexamethasone and Treatment may vary due to age or medical condition  Airway Management Planned: Oral ETT  Additional Equipment:   Intra-op Plan:   Post-operative Plan: Extubation in OR  Informed Consent: I have reviewed the patients History and Physical, chart, labs and discussed the procedure including the risks, benefits and alternatives for the proposed anesthesia with the patient or authorized representative who has indicated his/her understanding and acceptance.     Dental advisory given  Plan Discussed  with: CRNA  Anesthesia Plan Comments: (PAT note written 03/16/2019 by Myra Gianotti, PA-C. )       Anesthesia Quick Evaluation

## 2019-03-16 NOTE — Progress Notes (Signed)
Anesthesia Chart Review:  Case: [id number] Date/Time: 03/22/19 0715   Procedure: LEFT BREAST LUMPECTOMY WITH RADIOACTIVE SEED LOCALIZATION (Left Breast)   Anesthesia type: General   Pre-op diagnosis: left breast cancer   Location: [place] 01 / [place] OR   Surgeon: Erroll Luna, MD    RSL 03/21/19 at 10:45 AM.   DISCUSSION: Patient is a 85 year old female scheduled for the above procedure.   History includes never smoker, HTN, NSTEMI (08/25/17 in setting of rapid afib ~10 days post-cholecystectomy, likely demand ischemia from tachycardia rather than ACS but with suspected LAD disease given new apical akinesis on 08/26/17 echo; conservative therapy), afib/PAF (08/2017), edema/chronic venous insufficiency, goiter (right thyroid nodule), skin cancer, wears hearing aids, breast cancer (s/p bilateral lumpectomies with SNL biopsy 02/2006; DCIS right, s/p right breast lumpectomy 10/01/14; skin involvement over left breast cancer despite antiestrogen therapy, so left breast lumpectomy recommended for localized control).    Preoperative cardiology input as outlined by Kerin Ransom, PA-C on 03/02/19: "..Given past medical history and time since last visit, based on ACC/AHA guidelines, [name] would be at acceptable risk for the planned procedure without further cardiovascular testing.  OK to hold Eliquis 2 days pre op."  She denied having chest pain, palpitations, shortness of breath, fever at PAT RN visit.  She is to hold Eliquis 2 days prior to surgery. Preoperative PAT labs acceptable for OR. Presurgical COVID-19 test scheduled for 03/19/2019.  If negative and otherwise no acute changes, then I anticipate she can proceed as planned.   VS: BP (!) 133/43   Pulse 74   Temp 36.7 C   Resp 20   Ht 5' 2.5" (1.588 m)   Wt 73.6 kg   SpO2 99%   BMI 29.21 kg/m   PROVIDERS: Isaac Bliss, Rayford Halsted, MD is PCP  - Sanda Klein, MD is cardiologist. Last visit 06/13/18. No angina, so invasive angiography  not felt necessary. No ASAS due to simultaneous anticoagulation. Clinically euvolemic other than edema if she does not take her loop diuretic. 12 month follow-up recommended. Jana Hakim, Sarajane Jews, MD is HEM-ONC   LABS: Labs reviewed: Acceptable for surgery. (all labs ordered are listed, but only abnormal results are displayed)  Labs Reviewed  COMPREHENSIVE METABOLIC PANEL - Abnormal; Notable for the following components:      Result Value   Glucose, Bld 104 (*)    BUN 28 (*)    Creatinine, Ser 1.18 (*)    Total Protein 5.9 (*)    GFR calc non Af Amer 39 (*)    GFR calc Af Amer 45 (*)    All other components within normal limits  CBC WITH DIFFERENTIAL/PLATELET     IMAGES: CTA chest 08/26/17: IMPRESSION: 1.  No demonstrable pulmonary embolus. 2. No thoracic aortic aneurysm. There is extensive aortic and great vessel calcification as well as foci of coronary artery calcification. No dissection evident; note that the contrast bolus is not sufficient to exclude this entity from an imaging standpoint. 3. Pleural effusions bilaterally with compressive atelectasis in both lung bases. There may be superimposed pneumonia in the lung bases. There is bibasilar interstitial edema. There is felt to be a degree of underlying congestive heart failure. 4. Mildly enlarged pericarinal region lymph nodes. Suspect reactive etiology given the parenchymal lung changes. 5.  Small pericardial effusion. 6. 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   EKG: 06/12/28 ([facility]): NSR Possible  left atrial enlargement LVH with QRS widening and repolarization abnormality   CV: Echo 08/26/17: Study Conclusions - Left ventricle: The cavity size was normal. Wall thickness was   increased in a pattern of mild LVH. Systolic function was normal.   The estimated ejection fraction was in the range of 50% to  55%.   There is akinesis of the apical myocardium. Features are   consistent with a pseudonormal left ventricular filling pattern,   with concomitant abnormal relaxation and increased filling   pressure (grade 2 diastolic dysfunction). - Aortic valve: Trileaflet; mildly thickened, mildly calcified   leaflets. There was mild regurgitation. - Mitral valve: Severely calcified annulus. Mildly thickened,   mildly calcified leaflets . There was moderate regurgitation. - Left atrium: The atrium was mildly to moderately dilated.   Volume/bsa, S: 43.1 ml/m^2. - Tricuspid valve: There was moderate regurgitation. - Pulmonary arteries: Systolic pressure was moderately increased. (Comparison: 08/12/17: LVEF 55-60%, no regional wall motion abnormalities; grade 2 DD, mild MS, moderate MR, moderate TR, PA peak pressure 59 mmHG, small pericardial effusion)   Past Medical History:  Diagnosis Date  . Allergic rhinitis, seasonal   . Breast cancer of upper-outer quadrant of right female breast ([place]) 09/13/2014  . Chronic venous insufficiency   . Dermatophytosis of nail   . Dysrhythmia    H/o Atrial fibrillation  . Edema leg   . Family history of malignant neoplasm of breast   . Family history of malignant neoplasm of ovary   . Goiter    right thyroid nodule   . HTN (hypertension) 08/13/2017  . HX: breast cancer    bilateral  . Menopausal syndrome   . Osteoarthritis   . Osteoporosis   . Squamous cell skin cancer    right lower leg  . Wears glasses   . Wears hearing aid    both ears    Past Surgical History:  Procedure Laterality Date  . bilateral lumpectomies for breast cancer  Aug. 2007  . BREAST LUMPECTOMY WITH RADIOACTIVE SEED LOCALIZATION Right 10/01/2014   Procedure: BREAST LUMPECTOMY WITH RADIOACTIVE SEED LOCALIZATION;  Surgeon: Erroll Luna, MD;  Location: [facility];  Service: General;  Laterality: Right;  . CATARACT EXTRACTION    . CHOLECYSTECTOMY N/A 08/13/2017    Procedure: LAPAROSCOPIC CHOLECYSTECTOMY WITH INTRAOPERATIVE CHOLANGIOGRAM;  Surgeon: Excell Seltzer, MD;  Location: [facility];  Service: General;  Laterality: N/A;  . DILATION AND CURETTAGE OF UTERUS    . ESOPHAGOGASTRODUODENOSCOPY  07/26/2011   Procedure: ESOPHAGOGASTRODUODENOSCOPY (EGD);  Surgeon: Jeryl Columbia, MD;  Location: [facility];  Service: Endoscopy;  Laterality: N/A;  . history of lumbar compression fracture    . left eardum sx  1983  . left knee arthroscopic surgery    . no screening colonoscopy    . s/p bilateral lumpectomies    . SAVORY DILATION  07/26/2011   Procedure: SAVORY DILATION;  Surgeon: Jeryl Columbia, MD;  Location: [facility];  Service: Endoscopy;  Laterality: N/A;  . status post resection squamous cell cancer right lower leg    . TONSILLECTOMY    . UMBILICAL HERNIA REPAIR      MEDICATIONS: . apixaban (ELIQUIS) 5 MG TABS tablet  . atorvastatin (LIPITOR) 40 MG tablet  . furosemide (LASIX) 20 MG tablet  . ipratropium (ATROVENT) 0.06 % nasal spray  . metoprolol succinate (TOPROL-XL) 25 MG 24 hr tablet  . metoprolol succinate (TOPROL-XL) 50 MG 24 hr tablet  . montelukast (SINGULAIR) 10 MG tablet  . Multiple Vitamins-Minerals (  PRESERVISION AREDS 2) CAPS  . Polyethyl Glycol-Propyl Glycol (SYSTANE OP)   No current facility-administered medications for this encounter.     Myra Gianotti, PA-C Surgical Short Stay/Anesthesiology [facility] [place] Phone [phone] [facility] Phone [phone] 03/16/2019 4:27 PM

## 2019-03-19 ENCOUNTER — Other Ambulatory Visit (HOSPITAL_COMMUNITY)
Admission: RE | Admit: 2019-03-19 | Discharge: 2019-03-19 | Disposition: A | Discharge status: Home / Self Care | Payer: MEDICARE | Source: Ambulatory Visit | Attending: Surgery | Admitting: Surgery

## 2019-03-19 DIAGNOSIS — Z01812 Encounter for preprocedural laboratory examination: Secondary | ICD-10-CM | POA: Diagnosis not present

## 2019-03-19 DIAGNOSIS — Z170 Estrogen receptor positive status [ER+]: Secondary | ICD-10-CM | POA: Diagnosis not present

## 2019-03-19 DIAGNOSIS — C50912 Malignant neoplasm of unspecified site of left female breast: Secondary | ICD-10-CM | POA: Diagnosis not present

## 2019-03-19 DIAGNOSIS — Z20828 Contact with and (suspected) exposure to other viral communicable diseases: Secondary | ICD-10-CM | POA: Diagnosis not present

## 2019-03-19 LAB — SARS CORONAVIRUS 2 (TAT 6-24 HRS): SARS Coronavirus 2: NEGATIVE

## 2019-03-21 DIAGNOSIS — C50012 Malignant neoplasm of nipple and areola, left female breast: Secondary | ICD-10-CM | POA: Diagnosis not present

## 2019-03-22 ENCOUNTER — Other Ambulatory Visit: Payer: Self-pay

## 2019-03-22 ENCOUNTER — Other Ambulatory Visit (HOSPITAL_COMMUNITY): Payer: MEDICARE

## 2019-03-22 ENCOUNTER — Ambulatory Visit (HOSPITAL_COMMUNITY): Payer: MEDICARE | Admitting: Anesthesiology

## 2019-03-22 ENCOUNTER — Encounter (HOSPITAL_COMMUNITY)
Admission: RE | Disposition: A | Discharge status: Home / Self Care | Payer: Self-pay | Source: Ambulatory Visit | Attending: Surgery

## 2019-03-22 ENCOUNTER — Observation Stay (HOSPITAL_COMMUNITY)
Admission: RE | Admit: 2019-03-22 | Discharge: 2019-03-23 | Disposition: A | Discharge status: Home / Self Care | Payer: MEDICARE | Source: Ambulatory Visit | Attending: Surgery | Admitting: Surgery

## 2019-03-22 ENCOUNTER — Encounter (HOSPITAL_COMMUNITY): Payer: Self-pay

## 2019-03-22 ENCOUNTER — Ambulatory Visit (HOSPITAL_COMMUNITY): Payer: MEDICARE | Admitting: Vascular Surgery

## 2019-03-22 DIAGNOSIS — C50812 Malignant neoplasm of overlapping sites of left female breast: Secondary | ICD-10-CM | POA: Diagnosis not present

## 2019-03-22 DIAGNOSIS — I482 Chronic atrial fibrillation, unspecified: Secondary | ICD-10-CM | POA: Insufficient documentation

## 2019-03-22 DIAGNOSIS — C50412 Malignant neoplasm of upper-outer quadrant of left female breast: Secondary | ICD-10-CM | POA: Diagnosis not present

## 2019-03-22 DIAGNOSIS — D649 Anemia, unspecified: Secondary | ICD-10-CM | POA: Diagnosis not present

## 2019-03-22 DIAGNOSIS — Z803 Family history of malignant neoplasm of breast: Secondary | ICD-10-CM | POA: Diagnosis not present

## 2019-03-22 DIAGNOSIS — Z853 Personal history of malignant neoplasm of breast: Secondary | ICD-10-CM | POA: Insufficient documentation

## 2019-03-22 DIAGNOSIS — Z886 Allergy status to analgesic agent status: Secondary | ICD-10-CM | POA: Diagnosis not present

## 2019-03-22 DIAGNOSIS — I2510 Atherosclerotic heart disease of native coronary artery without angina pectoris: Secondary | ICD-10-CM | POA: Diagnosis not present

## 2019-03-22 DIAGNOSIS — I480 Paroxysmal atrial fibrillation: Secondary | ICD-10-CM | POA: Diagnosis not present

## 2019-03-22 DIAGNOSIS — Z79899 Other long term (current) drug therapy: Secondary | ICD-10-CM | POA: Insufficient documentation

## 2019-03-22 DIAGNOSIS — Z7901 Long term (current) use of anticoagulants: Secondary | ICD-10-CM | POA: Insufficient documentation

## 2019-03-22 DIAGNOSIS — M1990 Unspecified osteoarthritis, unspecified site: Secondary | ICD-10-CM | POA: Diagnosis not present

## 2019-03-22 DIAGNOSIS — Z170 Estrogen receptor positive status [ER+]: Secondary | ICD-10-CM | POA: Insufficient documentation

## 2019-03-22 DIAGNOSIS — C50912 Malignant neoplasm of unspecified site of left female breast: Secondary | ICD-10-CM | POA: Diagnosis present

## 2019-03-22 DIAGNOSIS — I252 Old myocardial infarction: Secondary | ICD-10-CM | POA: Insufficient documentation

## 2019-03-22 DIAGNOSIS — I10 Essential (primary) hypertension: Secondary | ICD-10-CM | POA: Insufficient documentation

## 2019-03-22 HISTORY — PX: BREAST LUMPECTOMY WITH RADIOACTIVE SEED LOCALIZATION: SHX6424

## 2019-03-22 LAB — PROTIME-INR
INR: 1.1 (ref 0.8–1.2)
Prothrombin Time: 14.3 seconds (ref 11.4–15.2)

## 2019-03-22 SURGERY — BREAST LUMPECTOMY WITH RADIOACTIVE SEED LOCALIZATION
Anesthesia: General | Site: Breast | Laterality: Left

## 2019-03-22 MED ORDER — FENTANYL CITRATE (PF) 100 MCG/2ML IJ SOLN: 12.5000 ug | INTRAMUSCULAR | Status: DC | PRN

## 2019-03-22 MED ORDER — BUPIVACAINE-EPINEPHRINE 0.25% -1:200000 IJ SOLN
INTRAMUSCULAR | Status: DC | PRN
  Administered 2019-03-22: 30 mL

## 2019-03-22 MED ORDER — LACTATED RINGERS IV SOLN
INTRAVENOUS | Status: DC | PRN
  Administered 2019-03-22: 07:00:00 via INTRAVENOUS

## 2019-03-22 MED ORDER — PRESERVISION AREDS 2 PO CAPS: 1.0000 | ORAL_CAPSULE | Freq: Two times a day (BID) | ORAL | Status: DC

## 2019-03-22 MED ORDER — POLYETHYL GLYCOL-PROPYL GLYCOL 0.4-0.3 % OP GEL: Freq: Two times a day (BID) | OPHTHALMIC | Status: DC | PRN

## 2019-03-22 MED ORDER — 0.9 % SODIUM CHLORIDE (POUR BTL) OPTIME
TOPICAL | Status: DC | PRN
  Administered 2019-03-22: 08:00:00 1000 mL

## 2019-03-22 MED ORDER — LIDOCAINE 2% (20 MG/ML) 5 ML SYRINGE
INTRAMUSCULAR | Status: AC
  Filled 2019-03-22: qty 5

## 2019-03-22 MED ORDER — CEFAZOLIN SODIUM-DEXTROSE 2-4 GM/100ML-% IV SOLN
2.0000 g | INTRAVENOUS | Status: AC
  Administered 2019-03-22: 08:00:00 2 g via INTRAVENOUS
  Filled 2019-03-22: qty 100

## 2019-03-22 MED ORDER — ROCURONIUM BROMIDE 10 MG/ML (PF) SYRINGE
PREFILLED_SYRINGE | INTRAVENOUS | Status: AC
  Filled 2019-03-22: qty 10

## 2019-03-22 MED ORDER — PROSIGHT PO TABS
1.0000 | ORAL_TABLET | Freq: Every day | ORAL | Status: DC
  Administered 2019-03-22: 1 via ORAL
  Filled 2019-03-22 (×2): qty 1

## 2019-03-22 MED ORDER — TRAMADOL HCL 50 MG PO TABS
50.0000 mg | ORAL_TABLET | Freq: Two times a day (BID) | ORAL | Status: DC | PRN
  Administered 2019-03-22: 50 mg via ORAL
  Filled 2019-03-22 (×2): qty 1

## 2019-03-22 MED ORDER — LIDOCAINE 2% (20 MG/ML) 5 ML SYRINGE
INTRAMUSCULAR | Status: DC | PRN
  Administered 2019-03-22: 60 mg via INTRAVENOUS

## 2019-03-22 MED ORDER — SUCCINYLCHOLINE CHLORIDE 200 MG/10ML IV SOSY
PREFILLED_SYRINGE | INTRAVENOUS | Status: AC
  Filled 2019-03-22: qty 10

## 2019-03-22 MED ORDER — FENTANYL CITRATE (PF) 250 MCG/5ML IJ SOLN
INTRAMUSCULAR | Status: DC | PRN
  Administered 2019-03-22: 25 ug via INTRAVENOUS
  Administered 2019-03-22: 50 ug via INTRAVENOUS
  Administered 2019-03-22: 25 ug via INTRAVENOUS

## 2019-03-22 MED ORDER — METOPROLOL SUCCINATE ER 25 MG PO TB24: 50.0000 mg | ORAL_TABLET | Freq: Every day | ORAL | Status: DC

## 2019-03-22 MED ORDER — ATORVASTATIN CALCIUM 40 MG PO TABS
40.0000 mg | ORAL_TABLET | Freq: Every day | ORAL | Status: DC
  Filled 2019-03-22: qty 1

## 2019-03-22 MED ORDER — ONDANSETRON HCL 4 MG/2ML IJ SOLN
INTRAMUSCULAR | Status: DC | PRN
  Administered 2019-03-22: 4 mg via INTRAVENOUS

## 2019-03-22 MED ORDER — CHLORHEXIDINE GLUCONATE CLOTH 2 % EX PADS: 6.0000 | MEDICATED_PAD | Freq: Once | CUTANEOUS | Status: DC

## 2019-03-22 MED ORDER — MONTELUKAST SODIUM 10 MG PO TABS
5.0000 mg | ORAL_TABLET | Freq: Every day | ORAL | Status: DC
  Administered 2019-03-22: 22:00:00 5 mg via ORAL
  Filled 2019-03-22: qty 1

## 2019-03-22 MED ORDER — ACETAMINOPHEN 500 MG PO TABS
1000.0000 mg | ORAL_TABLET | Freq: Once | ORAL | Status: AC
  Administered 2019-03-22: 07:00:00 1000 mg via ORAL

## 2019-03-22 MED ORDER — DEXAMETHASONE SODIUM PHOSPHATE 10 MG/ML IJ SOLN
INTRAMUSCULAR | Status: AC
  Filled 2019-03-22: qty 1

## 2019-03-22 MED ORDER — SODIUM CHLORIDE 0.9 % IV SOLN
INTRAVENOUS | Status: DC | PRN
  Administered 2019-03-22: 20 ug/min via INTRAVENOUS

## 2019-03-22 MED ORDER — FUROSEMIDE 20 MG PO TABS
20.0000 mg | ORAL_TABLET | Freq: Every day | ORAL | Status: DC
  Administered 2019-03-22: 20 mg via ORAL
  Filled 2019-03-22 (×2): qty 1

## 2019-03-22 MED ORDER — ONDANSETRON HCL 4 MG/2ML IJ SOLN: 4.0000 mg | Freq: Four times a day (QID) | INTRAMUSCULAR | Status: DC | PRN

## 2019-03-22 MED ORDER — STERILE WATER FOR IRRIGATION IR SOLN
Status: DC | PRN
  Administered 2019-03-22: 1000 mL

## 2019-03-22 MED ORDER — ENOXAPARIN SODIUM 40 MG/0.4ML ~~LOC~~ SOLN
40.0000 mg | SUBCUTANEOUS | Status: DC
  Filled 2019-03-22: qty 0.4

## 2019-03-22 MED ORDER — HYDRALAZINE HCL 20 MG/ML IJ SOLN: 10.0000 mg | INTRAMUSCULAR | Status: DC | PRN

## 2019-03-22 MED ORDER — BUPIVACAINE-EPINEPHRINE (PF) 0.25% -1:200000 IJ SOLN
INTRAMUSCULAR | Status: AC
  Filled 2019-03-22: qty 30

## 2019-03-22 MED ORDER — ONDANSETRON HCL 4 MG/2ML IJ SOLN
INTRAMUSCULAR | Status: AC
  Filled 2019-03-22: qty 2

## 2019-03-22 MED ORDER — DEXAMETHASONE SODIUM PHOSPHATE 10 MG/ML IJ SOLN
INTRAMUSCULAR | Status: DC | PRN
  Administered 2019-03-22: 10 mg via INTRAVENOUS

## 2019-03-22 MED ORDER — IPRATROPIUM BROMIDE 0.06 % NA SOLN
2.0000 | Freq: Four times a day (QID) | NASAL | Status: DC
  Administered 2019-03-22 (×3): 2 via NASAL
  Filled 2019-03-22: qty 15

## 2019-03-22 MED ORDER — PROPOFOL 10 MG/ML IV BOLUS
INTRAVENOUS | Status: DC | PRN
  Administered 2019-03-22: 20 mg via INTRAVENOUS
  Administered 2019-03-22: 80 mg via INTRAVENOUS

## 2019-03-22 MED ORDER — FENTANYL CITRATE (PF) 250 MCG/5ML IJ SOLN
INTRAMUSCULAR | Status: AC
  Filled 2019-03-22: qty 5

## 2019-03-22 MED ORDER — FENTANYL CITRATE (PF) 100 MCG/2ML IJ SOLN
INTRAMUSCULAR | Status: AC
  Filled 2019-03-22: qty 2

## 2019-03-22 MED ORDER — PROPOFOL 10 MG/ML IV BOLUS
INTRAVENOUS | Status: AC
  Filled 2019-03-22: qty 20

## 2019-03-22 MED ORDER — CEFAZOLIN SODIUM-DEXTROSE 2-4 GM/100ML-% IV SOLN
2.0000 g | Freq: Three times a day (TID) | INTRAVENOUS | Status: AC
  Administered 2019-03-22: 18:00:00 2 g via INTRAVENOUS
  Filled 2019-03-22 (×2): qty 100

## 2019-03-22 MED ORDER — ONDANSETRON 4 MG PO TBDP: 4.0000 mg | ORAL_TABLET | Freq: Four times a day (QID) | ORAL | Status: DC | PRN

## 2019-03-22 MED ORDER — METOPROLOL SUCCINATE ER 25 MG PO TB24
25.0000 mg | ORAL_TABLET | Freq: Every day | ORAL | Status: DC
  Administered 2019-03-22: 22:00:00 25 mg via ORAL
  Filled 2019-03-22: qty 1

## 2019-03-22 MED ORDER — BUPIVACAINE HCL (PF) 0.25 % IJ SOLN
INTRAMUSCULAR | Status: AC
  Filled 2019-03-22: qty 30

## 2019-03-22 MED ORDER — OXYCODONE HCL 5 MG PO TABS
ORAL_TABLET | ORAL | Status: AC
  Filled 2019-03-22: qty 1

## 2019-03-22 MED ORDER — ACETAMINOPHEN 650 MG RE SUPP: 650.0000 mg | Freq: Four times a day (QID) | RECTAL | Status: DC | PRN

## 2019-03-22 MED ORDER — POLYVINYL ALCOHOL 1.4 % OP SOLN: 1.0000 [drp] | OPHTHALMIC | Status: DC | PRN

## 2019-03-22 MED ORDER — OXYCODONE HCL 5 MG PO TABS
5.0000 mg | ORAL_TABLET | ORAL | Status: DC | PRN
  Administered 2019-03-22: 5 mg via ORAL
  Filled 2019-03-22: qty 1

## 2019-03-22 MED ORDER — FENTANYL CITRATE (PF) 100 MCG/2ML IJ SOLN
25.0000 ug | INTRAMUSCULAR | Status: DC | PRN
  Administered 2019-03-22: 25 ug via INTRAVENOUS

## 2019-03-22 MED ORDER — ACETAMINOPHEN 325 MG PO TABS: 650.0000 mg | ORAL_TABLET | Freq: Four times a day (QID) | ORAL | Status: DC | PRN

## 2019-03-22 MED ORDER — SODIUM CHLORIDE 0.9 % IV SOLN
INTRAVENOUS | Status: DC
  Administered 2019-03-22: 10:00:00 via INTRAVENOUS

## 2019-03-22 MED ORDER — ACETAMINOPHEN 500 MG PO TABS
ORAL_TABLET | ORAL | Status: AC
  Filled 2019-03-22: qty 2

## 2019-03-22 SURGICAL SUPPLY — 41 items
ADH SKN CLS APL DERMABOND .7 (GAUZE/BANDAGES/DRESSINGS) ×1
APL PRP STRL LF DISP 70% ISPRP (MISCELLANEOUS) ×1
APPLIER CLIP 9.375 MED OPEN (MISCELLANEOUS)
APR CLP MED 9.3 20 MLT OPN (MISCELLANEOUS)
BINDER BREAST XLRG (GAUZE/BANDAGES/DRESSINGS) ×2 IMPLANT
CANISTER SUCT 3000ML PPV (MISCELLANEOUS) ×2 IMPLANT
CHLORAPREP W/TINT 26 (MISCELLANEOUS) ×3 IMPLANT
CLIP APPLIE 9.375 MED OPEN (MISCELLANEOUS) IMPLANT
COVER PROBE W GEL 5X96 (DRAPES) ×3 IMPLANT
COVER SURGICAL LIGHT HANDLE (MISCELLANEOUS) ×3 IMPLANT
COVER WAND RF STERILE (DRAPES) ×1 IMPLANT
DERMABOND ADVANCED (GAUZE/BANDAGES/DRESSINGS) ×2
DERMABOND ADVANCED .7 DNX12 (GAUZE/BANDAGES/DRESSINGS) ×1 IMPLANT
DEVICE DUBIN SPECIMEN MAMMOGRA (MISCELLANEOUS) ×3 IMPLANT
DRAPE CHEST BREAST 15X10 FENES (DRAPES) ×3 IMPLANT
ELECT CAUTERY BLADE 6.4 (BLADE) ×3 IMPLANT
ELECT REM PT RETURN 9FT ADLT (ELECTROSURGICAL) ×3
ELECTRODE REM PT RTRN 9FT ADLT (ELECTROSURGICAL) ×1 IMPLANT
GAUZE SPONGE 4X4 12PLY STRL (GAUZE/BANDAGES/DRESSINGS) ×2 IMPLANT
GLOVE BIO SURGEON STRL SZ8 (GLOVE) ×3 IMPLANT
GLOVE BIOGEL PI IND STRL 8 (GLOVE) ×1 IMPLANT
GLOVE BIOGEL PI INDICATOR 8 (GLOVE) ×2
GOWN STRL REUS W/ TWL LRG LVL3 (GOWN DISPOSABLE) ×1 IMPLANT
GOWN STRL REUS W/ TWL XL LVL3 (GOWN DISPOSABLE) ×1 IMPLANT
GOWN STRL REUS W/TWL LRG LVL3 (GOWN DISPOSABLE) ×3
GOWN STRL REUS W/TWL XL LVL3 (GOWN DISPOSABLE) ×3
KIT BASIN OR (CUSTOM PROCEDURE TRAY) ×3 IMPLANT
KIT MARKER MARGIN INK (KITS) IMPLANT
NDL HYPO 25GX1X1/2 BEV (NEEDLE) IMPLANT
NEEDLE HYPO 25GX1X1/2 BEV (NEEDLE) ×3 IMPLANT
NS IRRIG 1000ML POUR BTL (IV SOLUTION) IMPLANT
PACK GENERAL/GYN (CUSTOM PROCEDURE TRAY) ×3 IMPLANT
SUT MNCRL AB 4-0 PS2 18 (SUTURE) ×3 IMPLANT
SUT SILK 2 0 SH (SUTURE) IMPLANT
SUT VIC AB 2-0 SH 27 (SUTURE) ×3
SUT VIC AB 2-0 SH 27XBRD (SUTURE) ×1 IMPLANT
SUT VIC AB 3-0 SH 27 (SUTURE) ×3
SUT VIC AB 3-0 SH 27X BRD (SUTURE) ×1 IMPLANT
SUT VIC AB 3-0 SH 8-18 (SUTURE) ×2 IMPLANT
SYR CONTROL 10ML LL (SYRINGE) IMPLANT
TOWEL GREEN STERILE FF (TOWEL DISPOSABLE) IMPLANT

## 2019-03-22 NOTE — Progress Notes (Signed)
Patient admitted to unit from PACU, VSS. Patient settled in bed and oriented to unit and hospital routine. Patient has left breast incision with skin glue, clean, dry and intact. Breast binder in place. Pt resting comfortably in bed with call bell in reach and side rails up. Instructed patient to utilize call bell for assistance. Will continue to monitor closely for remainder of shift.

## 2019-03-22 NOTE — Op Note (Signed)
Preoperative diagnosis: Left breast cancer stage I  Postoperative diagnosis: Same  Procedure: Left breast seed localized lumpectomy  Surgeon: Erroll Luna, MD  Anesthesia: LMA with 0.25% Sensorcaine local  EBL: 20 cc  Specimen left breast lumpectomy specimen to include skin overlying the lesion since it was invading the skin and a second skin lesion just inferior to that by 2 cm as one specimen.  Drains: None  IV fluids: Per anesthesia record  Indications for procedure: The patient is a pleasant 85 year old female with hormone receptor positive left breast cancer.  This is occurred in a previous scar from previous lumpectomy.  She has a history of breast cancer on the right as well.  Options of continue medical management with antiestrogen therapy versus lumpectomy were discussed.  She is in relatively good health and this is beginning to erode through her skin.  Concerns were that this is progressing despite being on antiestrogen therapy even in the face of a positive hormone receptor status.  After lengthy discussion she felt that lumpectomy would be her best option since she is in relatively good health for her age and the fact this was beginning to erode into her skin and certainly could be an issue in the near future with ulceration and bleeding.  She is on Eliquis and this would complicate this issue on top of that.  After lengthy discussion of the pros and cons of surgery and potential complications to include bleeding, infection, death, blood clot, stroke, exacerbation of underlying medical problems, wound complications, poor wound healing and the need further treatments and/or procedures she agreed to proceed.  Discussed the issue of seroma postoperatively and potential cosmetic issues given that skin had to be removed with this lesion as well which measured about a centimeter on mammogram and physical examination.  Description of procedure: The patient was met in the holding area.   The neoprobe was used to identify the seed in the left breast and this was marked as the correct side.  Questions were answered.  She was taken back to the operating.  She is placed supine upon the OR table.  After induction of general esthesia, left breast was prepped and draped in sterile fashion, she received preoperative antibiotics and timeout was performed.  Neoprobe was used to identify the lesion and films were available for review.  The lesion was skin base and this corresponded to the seed.  There is a second skin lesion below that by about 2 cm inferior to the initial skin lesion that was hard.  This was concerning for a skin metastasis.  I felt that both should be excised as one specimen which would involve about 4 cm of skin.  Given her advanced age and concern for ulceration, I did not want to leave this behind since it looks suspicious.  An elliptical incision was made about both lesions.  The lesion was relatively superficial.  We excised all tissue around both lesions with a grossly negative margin.  I had to extend the incision more inferiorly and make an L-shaped out of it to facilitate excision and to assist me in closure.  This was done with cautery.  Entire lesion was removed with grossly negative margins and was inked.  Intraoperative Faxitron revealed both the seed and clip to be in the center of the specimen.  The wound was larger than normal given the amount of skin I had to excise.  I opted to inject local anesthetic circumferentially around the wound.  Hemostasis was  achieved.  I mobilized the skin off the underlying breast tissue I then closed the underlying breast tissue with 3-0 Vicryl.  I then used 3-0 Vicryl to approximate the skin edges and 4 Monocryl to close the skin.  There is a slight ill variation of the incision which was more noted inferiorly.  This seemed to close incision with not too much excessive tension.  Dermabond applied.  Breast binder placed.  All counts found to be  correct.  The patient was awoke extubated taken to recovery in satisfactory condition.

## 2019-03-22 NOTE — Anesthesia Postprocedure Evaluation (Signed)
Anesthesia Post Note  Patient: [name]  Procedure(s) Performed: LEFT BREAST LUMPECTOMY WITH RADIOACTIVE SEED LOCALIZATION (Left Breast)     Patient location during evaluation: PACU Anesthesia Type: General Level of consciousness: awake and alert Pain management: pain level controlled Vital Signs Assessment: post-procedure vital signs reviewed and stable Respiratory status: spontaneous breathing, nonlabored ventilation and respiratory function stable Cardiovascular status: blood pressure returned to baseline and stable Postop Assessment: no apparent nausea or vomiting Anesthetic complications: no    Last Vitals:  Vitals:   03/22/19 0856 03/22/19 0920  BP: 132/60 (!) 137/59  Pulse: 70 76  Resp: 17 18  Temp:  36.6 C  SpO2: 100% 98%    Last Pain:  Vitals:   03/22/19 0920  TempSrc: Oral  PainSc:                  Donaldo Teegarden,W. EDMOND

## 2019-03-22 NOTE — Progress Notes (Signed)
Pt placed bilat hearing aides

## 2019-03-22 NOTE — Transfer of Care (Signed)
Immediate Anesthesia Transfer of Care Note  Patient: [name]  Procedure(s) Performed: LEFT BREAST LUMPECTOMY WITH RADIOACTIVE SEED LOCALIZATION (Left Breast)  Patient Location: PACU  Anesthesia Type:General  Level of Consciousness: awake, alert , oriented and patient cooperative  Airway & Oxygen Therapy: Patient Spontanous Breathing  Post-op Assessment: Report given to RN, Post -op Vital signs reviewed and stable and Patient moving all extremities  Post vital signs: Reviewed and stable  Last Vitals:  Vitals Value Taken Time  BP 149/62 03/22/19 0841  Temp    Pulse 78 03/22/19 0843  Resp 15 03/22/19 0843  SpO2 97 % 03/22/19 0843  Vitals shown include unvalidated device data.  Last Pain:  Vitals:   03/22/19 0645  TempSrc:   PainSc: 0-No pain         Complications: No apparent anesthesia complications

## 2019-03-22 NOTE — Interval H&P Note (Signed)
History and Physical Interval Note:  03/22/2019 7:24 AM  [name]  has presented today for surgery, with the diagnosis of left breast cancer.  The various methods of treatment have been discussed with the patient and family. After consideration of risks, benefits and other options for treatment, the patient has consented to  Procedure(s): LEFT BREAST LUMPECTOMY WITH RADIOACTIVE SEED LOCALIZATION (Left) as a surgical intervention.  The patient's history has been reviewed, patient examined, no change in status, stable for surgery.  I have reviewed the patient's chart and labs.  Questions were answered to the patient's satisfaction.     [place]

## 2019-03-22 NOTE — Anesthesia Procedure Notes (Signed)
Procedure Name: LMA Insertion Date/Time: 03/22/2019 7:44 AM Performed by: Amadeo Garnet, CRNA Pre-anesthesia Checklist: Patient identified, Emergency Drugs available, Suction available, Patient being monitored and Timeout performed Patient Re-evaluated:Patient Re-evaluated prior to induction Oxygen Delivery Method: Circle system utilized Preoxygenation: Pre-oxygenation with 100% oxygen Induction Type: IV induction Ventilation: Mask ventilation without difficulty LMA: LMA inserted LMA Size: 4.0 Number of attempts: 1 Placement Confirmation: positive ETCO2 and breath sounds checked- equal and bilateral Tube secured with: Tape Dental Injury: Teeth and Oropharynx as per pre-operative assessment

## 2019-03-23 ENCOUNTER — Encounter (HOSPITAL_COMMUNITY): Payer: Self-pay | Admitting: Surgery

## 2019-03-23 DIAGNOSIS — I482 Chronic atrial fibrillation, unspecified: Secondary | ICD-10-CM | POA: Diagnosis not present

## 2019-03-23 DIAGNOSIS — I10 Essential (primary) hypertension: Secondary | ICD-10-CM | POA: Diagnosis not present

## 2019-03-23 DIAGNOSIS — Z853 Personal history of malignant neoplasm of breast: Secondary | ICD-10-CM | POA: Diagnosis not present

## 2019-03-23 DIAGNOSIS — C50412 Malignant neoplasm of upper-outer quadrant of left female breast: Secondary | ICD-10-CM | POA: Diagnosis not present

## 2019-03-23 DIAGNOSIS — Z170 Estrogen receptor positive status [ER+]: Secondary | ICD-10-CM | POA: Diagnosis not present

## 2019-03-23 DIAGNOSIS — I2510 Atherosclerotic heart disease of native coronary artery without angina pectoris: Secondary | ICD-10-CM | POA: Diagnosis not present

## 2019-03-23 LAB — BASIC METABOLIC PANEL
Anion gap: 8 (ref 5–15)
BUN: 25 mg/dL — ABNORMAL HIGH (ref 8–23)
CO2: 23 mmol/L (ref 22–32)
Calcium: 8.2 mg/dL — ABNORMAL LOW (ref 8.9–10.3)
Chloride: 107 mmol/L (ref 98–111)
Creatinine, Ser: 1.1 mg/dL — ABNORMAL HIGH (ref 0.44–1.00)
GFR calc Af Amer: 49 mL/min — ABNORMAL LOW (ref >60)
GFR calc non Af Amer: 43 mL/min — ABNORMAL LOW (ref >60)
Glucose, Bld: 131 mg/dL — ABNORMAL HIGH (ref 70–99)
Potassium: 4 mmol/L (ref 3.5–5.1)
Sodium: 138 mmol/L (ref 135–145)

## 2019-03-23 LAB — CBC
HCT: 32.7 % — ABNORMAL LOW (ref 36.0–46.0)
Hemoglobin: 10.8 g/dL — ABNORMAL LOW (ref 12.0–15.0)
MCH: 31.4 pg (ref 26.0–34.0)
MCHC: 33 g/dL (ref 30.0–36.0)
MCV: 95.1 fL (ref 80.0–100.0)
Platelets: 155 10*3/uL (ref 150–400)
RBC: 3.44 MIL/uL — ABNORMAL LOW (ref 3.87–5.11)
RDW: 12.9 % (ref 11.5–15.5)
WBC: 6.9 10*3/uL (ref 4.0–10.5)
nRBC: 0 % (ref 0.0–0.2)

## 2019-03-23 MED ORDER — ACETAMINOPHEN 325 MG PO TABS: 650.0000 mg | ORAL_TABLET | Freq: Four times a day (QID) | ORAL | 0 refills | Status: DC | PRN

## 2019-03-23 NOTE — Discharge Summary (Signed)
Physician Discharge Summary  Patient ID: [name] MRN: [id number] DOB/AGE: 08-02-1921 85 y.o.  Admit date: 03/22/2019 Discharge date: 03/23/2019  Admission Diagnoses:  Discharge Diagnoses:  Active Problems:   Breast cancer, stage 1, left [facility])   Discharged Condition: good  Hospital Course: Pt did well after lumpectomy.  She had no post op issues. She was discharged home.     Treatments: surgery: left breast lumpectomy   Discharge Exam: Blood pressure (!) 115/52, pulse 70, temperature 98.2 F (36.8 C), temperature source Oral, resp. rate 18, height 5' 2.5" (1.588 m), weight 72.6 kg, SpO2 98 %. Incision/Wound:CDI left breast no hematoma  Disposition: home       Signed: Joyice Faster Laisa Larrick 03/23/2019, 9:13 AM

## 2019-03-23 NOTE — Discharge Instructions (Signed)
[facility] °Office Phone Number [phone] ° °BREAST BIOPSY/ PARTIAL MASTECTOMY: POST OP INSTRUCTIONS ° °Always review your discharge instruction sheet given to you by the facility where your surgery was performed. ° °IF YOU HAVE DISABILITY OR FAMILY LEAVE FORMS, YOU MUST BRING THEM TO THE OFFICE FOR PROCESSING.  DO NOT GIVE THEM TO YOUR DOCTOR. ° °1. A prescription for pain medication may be given to you upon discharge.  Take your pain medication as prescribed, if needed.  If narcotic pain medicine is not needed, then you may take acetaminophen (Tylenol) or ibuprofen (Advil) as needed. °2. Take your usually prescribed medications unless otherwise directed °3. If you need a refill on your pain medication, please contact your pharmacy.  They will contact our office to request authorization.  Prescriptions will not be filled after 5pm or on week-ends. °4. You should eat very light the first 24 hours after surgery, such as soup, crackers, pudding, etc.  Resume your normal diet the day after surgery. °5. Most patients will experience some swelling and bruising in the breast.  Ice packs and a good support bra will help.  Swelling and bruising can take several days to resolve.  °6. It is common to experience some constipation if taking pain medication after surgery.  Increasing fluid intake and taking a stool softener will usually help or prevent this problem from occurring.  A mild laxative (Milk of Magnesia or Miralax) should be taken according to package directions if there are no bowel movements after 48 hours. °7. Unless discharge instructions indicate otherwise, you may remove your bandages 24-48 hours after surgery, and you may shower at that time.  You may have steri-strips (small skin tapes) in place directly over the incision.  These strips should be left on the skin for 7-10 days.  If your surgeon used skin glue on the incision, you may shower in 24 hours.  The glue will flake off over the  next 2-3 weeks.  Any sutures or staples will be removed at the office during your follow-up visit. °8. ACTIVITIES:  You may resume regular daily activities (gradually increasing) beginning the next day.  Wearing a good support bra or sports bra minimizes pain and swelling.  You may have sexual intercourse when it is comfortable. °a. You may drive when you no longer are taking prescription pain medication, you can comfortably wear a seatbelt, and you can safely maneuver your car and apply brakes. °b. RETURN TO WORK:  ______________________________________________________________________________________ °9. You should see your doctor in the office for a follow-up appointment approximately two weeks after your surgery.  Your doctor’s nurse will typically make your follow-up appointment when she calls you with your pathology report.  Expect your pathology report 2-3 business days after your surgery.  You may call to check if you do not hear from us after three days. °10. OTHER INSTRUCTIONS: _______________________________________________________________________________________________ _____________________________________________________________________________________________________________________________________ °_____________________________________________________________________________________________________________________________________ °_____________________________________________________________________________________________________________________________________ ° °WHEN TO CALL YOUR DOCTOR: °1. Fever over 101.0 °2. Nausea and/or vomiting. °3. Extreme swelling or bruising. °4. Continued bleeding from incision. °5. Increased pain, redness, or drainage from the incision. ° °The clinic staff is available to answer your questions during regular business hours.  Please don’t hesitate to call and ask to speak to one of the nurses for clinical concerns.  If you have a medical emergency, go to the nearest  emergency room or call 911.  A surgeon from [facility] is always on call at the hospital. ° °For further questions, please visit [email or web address]  °

## 2019-03-23 NOTE — Progress Notes (Signed)
Wayna Chalet to be D/C'd  per MD order. Discussed with the patient and all questions fully answered.  VSS, Skin clean, dry and intact without evidence of skin break down, no evidence of skin tears noted.  IV catheter discontinued intact. Site without signs and symptoms of complications. Dressing and pressure applied.  An After Visit Summary was printed and given to the patient. Patient received prescription.  D/c education completed with patient/family including follow up instructions, medication list, d/c activities limitations if indicated, with other d/c instructions as indicated by MD - patient able to verbalize understanding, all questions fully answered.   Patient instructed to return to ED, call 911, or call MD for any changes in condition.   Patient to be escorted via [place], and D/C home via private auto.

## 2019-03-27 ENCOUNTER — Other Ambulatory Visit (HOSPITAL_COMMUNITY): Payer: MEDICARE

## 2019-04-09 DIAGNOSIS — L300 Nummular dermatitis: Secondary | ICD-10-CM | POA: Diagnosis not present

## 2019-04-09 DIAGNOSIS — L298 Other pruritus: Secondary | ICD-10-CM | POA: Diagnosis not present

## 2019-05-09 ENCOUNTER — Telehealth: Payer: Self-pay | Admitting: *Deleted

## 2019-05-09 NOTE — Telephone Encounter (Signed)
Spoke with patient and she is mobile and the pain has subsided, but there is still swelling.  Appointment created

## 2019-05-09 NOTE — Telephone Encounter (Signed)
Copied from [place] [id number]. Topic: General - Inquiry >> May 09, 2019  9:40 AM Virl Axe D wrote: Reason for CRM: Pt stated she fell on 04/25/19 and hit her right knee. It is bit red and not as swollen as it was. Pt wanted Dr. Jerilee Hoh to be aware and if pt needs to come in and see her, please let her know.

## 2019-05-09 NOTE — Telephone Encounter (Signed)
As long as pain is minimal and she is able to move it and bear weight, no need to see me. Otherwise, I would be happy to see her in person to evaluate her knee.

## 2019-05-11 ENCOUNTER — Ambulatory Visit (INDEPENDENT_AMBULATORY_CARE_PROVIDER_SITE_OTHER): Payer: MEDICARE | Admitting: Internal Medicine

## 2019-05-11 ENCOUNTER — Encounter: Payer: Self-pay | Admitting: Internal Medicine

## 2019-05-11 ENCOUNTER — Ambulatory Visit (HOSPITAL_COMMUNITY)
Admission: RE | Admit: 2019-05-11 | Discharge: 2019-05-11 | Disposition: A | Discharge status: Home / Self Care | Payer: MEDICARE | Source: Ambulatory Visit | Attending: Internal Medicine | Admitting: Internal Medicine

## 2019-05-11 ENCOUNTER — Other Ambulatory Visit: Payer: Self-pay

## 2019-05-11 ENCOUNTER — Ambulatory Visit (INDEPENDENT_AMBULATORY_CARE_PROVIDER_SITE_OTHER): Payer: MEDICARE

## 2019-05-11 VITALS — BP 140/60 | HR 80 | Temp 98.0°F | Wt 166.7 lb

## 2019-05-11 DIAGNOSIS — R600 Localized edema: Secondary | ICD-10-CM | POA: Diagnosis not present

## 2019-05-11 DIAGNOSIS — M79661 Pain in right lower leg: Secondary | ICD-10-CM

## 2019-05-11 DIAGNOSIS — I2510 Atherosclerotic heart disease of native coronary artery without angina pectoris: Secondary | ICD-10-CM | POA: Diagnosis not present

## 2019-05-11 NOTE — Patient Instructions (Signed)
-  Nice seeing you today!!  -Xray and Ultrasound of your right lower leg today.

## 2019-05-11 NOTE — Progress Notes (Signed)
RLE venous duplex       has been completed. Preliminary results can be found under CV proc through chart review. June Leap, BS, RDMS, RVT   Attempted call report with no answer to Dr. Jerilee Hoh

## 2019-05-11 NOTE — Progress Notes (Signed)
Acute Office Visit     CC/Reason for Visit: right "knee" pain following fall  HPI: [name] is a 85 y.o. female who is coming in today for the above mentioned reasons. On 10/5 she tripped at home while going around a coffee table onto her knees. No LOC. She was unable to get up on her own. Her husband called staff at their [place] who assisted. She has not sought medical care for this. The manager of her ILF and a friend asked her to come see me as she still has significant pain and swelling of her right lower leg (what she thought was her knee). I have seen her ambulating and she is able to walk with a cane with only minor difficulty, she can bend her knee without a problem. She is on Eliquis for a fib.  Past Medical/Surgical History: Past Medical History:  Diagnosis Date  . Allergic rhinitis, seasonal   . Breast cancer of upper-outer quadrant of right female breast ([place]) 09/13/2014  . Chronic venous insufficiency   . Dermatophytosis of nail   . Dysrhythmia    H/o Atrial fibrillation  . Edema leg   . Family history of malignant neoplasm of breast   . Family history of malignant neoplasm of ovary   . Goiter    right thyroid nodule   . HTN (hypertension) 08/13/2017  . HX: breast cancer    bilateral  . Menopausal syndrome   . Osteoarthritis   . Osteoporosis   . Squamous cell skin cancer    right lower leg  . Wears glasses   . Wears hearing aid    both ears    Past Surgical History:  Procedure Laterality Date  . bilateral lumpectomies for breast cancer  Aug. 2007  . BREAST LUMPECTOMY WITH RADIOACTIVE SEED LOCALIZATION Right 10/01/2014   Procedure: BREAST LUMPECTOMY WITH RADIOACTIVE SEED LOCALIZATION;  Surgeon: Erroll Luna, MD;  Location: [facility];  Service: General;  Laterality: Right;  . BREAST LUMPECTOMY WITH RADIOACTIVE SEED LOCALIZATION Left 03/22/2019   Procedure: LEFT BREAST LUMPECTOMY WITH RADIOACTIVE SEED LOCALIZATION;  Surgeon: Erroll Luna,  MD;  Location: [facility];  Service: General;  Laterality: Left;  . CATARACT EXTRACTION    . CHOLECYSTECTOMY N/A 08/13/2017   Procedure: LAPAROSCOPIC CHOLECYSTECTOMY WITH INTRAOPERATIVE CHOLANGIOGRAM;  Surgeon: Excell Seltzer, MD;  Location: [facility];  Service: General;  Laterality: N/A;  . DILATION AND CURETTAGE OF UTERUS    . ESOPHAGOGASTRODUODENOSCOPY  07/26/2011   Procedure: ESOPHAGOGASTRODUODENOSCOPY (EGD);  Surgeon: Jeryl Columbia, MD;  Location: [facility];  Service: Endoscopy;  Laterality: N/A;  . history of lumbar compression fracture    . left eardum sx  1983  . left knee arthroscopic surgery    . no screening colonoscopy    . s/p bilateral lumpectomies    . SAVORY DILATION  07/26/2011   Procedure: SAVORY DILATION;  Surgeon: Jeryl Columbia, MD;  Location: [facility];  Service: Endoscopy;  Laterality: N/A;  . status post resection squamous cell cancer right lower leg    . TONSILLECTOMY    . UMBILICAL HERNIA REPAIR      Social History:  reports that she has never smoked. She has never used smokeless tobacco. She reports that she does not drink alcohol or use drugs.  Allergies: Allergies  Allergen Reactions  . Celecoxib Swelling    Swelling-leg    Family History:  Family History  Problem Relation Age of Onset  . Cancer Mother 65  breast  . Heart attack Father   . Heart attack Brother   . Heart attack Brother   . Cancer Maternal Aunt 55       breast  . Coronary artery disease Other   . Cancer Other        ovarian; daughter of mat aunt w/ BC in 25s  . Heart disease Brother   . Cancer Maternal Aunt 60       breast     Current Outpatient Medications:  .  acetaminophen (TYLENOL) 325 MG tablet, Take 2 tablets (650 mg total) by mouth every 6 (six) hours as needed for mild pain (or Fever >/= 101)., Disp: 30 tablet, Rfl: 0 .  apixaban (ELIQUIS) 5 MG TABS tablet, Take 1 tablet (5 mg total) by mouth 2 (two) times daily., Disp: 180 tablet, Rfl: 3 .  atorvastatin (LIPITOR)  40 MG tablet, TAKE 1 TABLET BY MOUTH EVERY DAY (Patient taking differently: Take 40 mg by mouth daily. ), Disp: 90 tablet, Rfl: 0 .  furosemide (LASIX) 20 MG tablet, Take 1 tablet (20 mg total) by mouth daily., Disp: 90 tablet, Rfl: 2 .  ipratropium (ATROVENT) 0.06 % nasal spray, Place 2 sprays into both nostrils 4 (four) times daily. (Patient taking differently: Place 2 sprays into both nostrils 2 (two) times daily. ), Disp: 30 mL, Rfl: 5 .  metoprolol succinate (TOPROL-XL) 25 MG 24 hr tablet, Take 25 mg by mouth at bedtime., Disp: , Rfl:  .  metoprolol succinate (TOPROL-XL) 50 MG 24 hr tablet, Take 1 tablet (50 mg total) by mouth at bedtime. Take with or immediately following a meal. CALL FOR VIRTUAL VISIT, Disp: 90 tablet, Rfl: 3 .  montelukast (SINGULAIR) 10 MG tablet, TAKE 1 TABLET BY MOUTH EVERYDAY AT BEDTIME, Disp: 90 tablet, Rfl: 0 .  Multiple Vitamins-Minerals (PRESERVISION AREDS 2) CAPS, Take 1 capsule by mouth 2 (two) times daily. , Disp: , Rfl:  .  Polyethyl Glycol-Propyl Glycol (SYSTANE OP), Place 1 drop into both eyes 2 (two) times daily as needed (dry eyes)., Disp: , Rfl:   Review of Systems:  Constitutional: Denies fever, chills, diaphoresis, appetite change and fatigue.  HEENT: Denies photophobia, eye pain, redness, hearing loss, ear pain, congestion, sore throat, rhinorrhea, sneezing, mouth sores, trouble swallowing, neck pain, neck stiffness and tinnitus.   Respiratory: Denies SOB, DOE, cough, chest tightness,  and wheezing.   Cardiovascular: Denies chest pain, palpitations and leg swelling.  Gastrointestinal: Denies nausea, vomiting, abdominal pain, diarrhea, constipation, blood in stool and abdominal distention.  Genitourinary: Denies dysuria, urgency, frequency, hematuria, flank pain and difficulty urinating.  Endocrine: Denies: hot or cold intolerance, sweats, changes in hair or nails, polyuria, polydipsia. Musculoskeletal: Positive for myalgias, back pain, joint swelling,  arthralgias and gait problem.  Skin: Denies pallor, rash and wound.  Neurological: Denies dizziness, seizures, syncope, weakness, light-headedness, numbness and headaches.  Hematological: Denies adenopathy. Easy bruising, personal or family bleeding history  Psychiatric/Behavioral: Denies suicidal ideation, mood changes, confusion, nervousness, sleep disturbance and agitation    Physical Exam: Vitals:   05/11/19 1432  BP: 140/60  Pulse: 80  Temp: 98 F (36.7 C)  TempSrc: Temporal  SpO2: 98%  Weight: 166 lb 11.2 oz (75.6 kg)    Body mass index is 30 kg/m.   Constitutional: NAD, calm, comfortable Eyes: PERRL, lids and conjunctivae normal, wears corrective lenses. ENMT: Mucous membranes are moist.  Musculoskeletal: See below pic:       Neurologic: grossly intact and non-focal  Psychiatric: Normal judgment  and insight. Alert and oriented x 3. Normal mood.    Impression and Plan:  Edema of right lower leg Pain of right lower leg -Doubt knee issue, looks to be more upper part of lower leg below the knee joint. Concerned there may be significant bruising as she is on Eliquis. -Xray today in house to r/o fracture. -Will also send for doppler to r/o DVT as a precaution (altho doubt as she is already fully anticoagulated on Eliquis). -Further treatment plan to follow results.    Patient Instructions  -Nice seeing you today!!  -Xray and Ultrasound of your right lower leg today.       Lelon Frohlich, MD [facility]

## 2019-05-15 ENCOUNTER — Telehealth: Payer: Self-pay | Admitting: *Deleted

## 2019-05-15 NOTE — Telephone Encounter (Signed)
Spoke with patient. See result note.  

## 2019-05-15 NOTE — Telephone Encounter (Signed)
Copied from [place] [id number]. Topic: General - Inquiry >> May 14, 2019  3:43 PM Alanda Slim E wrote: Reason for CRM: [facility] and test were ran/ Pt called to see what Dr. Jerilee Hoh thinks about the test / please advise

## 2019-05-25 ENCOUNTER — Other Ambulatory Visit: Payer: Self-pay | Admitting: Cardiovascular Disease

## 2019-06-04 ENCOUNTER — Other Ambulatory Visit: Payer: Self-pay | Admitting: Allergy and Immunology

## 2019-06-05 ENCOUNTER — Encounter: Payer: Self-pay | Admitting: Podiatry

## 2019-06-05 ENCOUNTER — Ambulatory Visit (INDEPENDENT_AMBULATORY_CARE_PROVIDER_SITE_OTHER): Payer: MEDICARE | Admitting: Podiatry

## 2019-06-05 ENCOUNTER — Other Ambulatory Visit: Payer: Self-pay

## 2019-06-05 DIAGNOSIS — M79676 Pain in unspecified toe(s): Secondary | ICD-10-CM

## 2019-06-05 DIAGNOSIS — M79604 Pain in right leg: Secondary | ICD-10-CM | POA: Diagnosis not present

## 2019-06-05 DIAGNOSIS — Q828 Other specified congenital malformations of skin: Secondary | ICD-10-CM

## 2019-06-05 DIAGNOSIS — B351 Tinea unguium: Secondary | ICD-10-CM | POA: Diagnosis not present

## 2019-06-05 DIAGNOSIS — R600 Localized edema: Secondary | ICD-10-CM | POA: Diagnosis not present

## 2019-06-05 DIAGNOSIS — R2681 Unsteadiness on feet: Secondary | ICD-10-CM | POA: Diagnosis not present

## 2019-06-05 DIAGNOSIS — D689 Coagulation defect, unspecified: Secondary | ICD-10-CM | POA: Diagnosis not present

## 2019-06-05 DIAGNOSIS — Z9181 History of falling: Secondary | ICD-10-CM | POA: Diagnosis not present

## 2019-06-05 DIAGNOSIS — M6281 Muscle weakness (generalized): Secondary | ICD-10-CM | POA: Diagnosis not present

## 2019-06-05 DIAGNOSIS — M79661 Pain in right lower leg: Secondary | ICD-10-CM | POA: Diagnosis not present

## 2019-06-05 NOTE — Progress Notes (Signed)
She presents today chief complaint of painful calluses and elongated nails bilaterally.  Objective: Vital signs are stable alert and oriented x3 pulses remain palpable.  Reactive hyper keratomas plantar aspect of the forefoot bilaterally punctated in nature.  Toenails are long thick yellow dystrophic with mycotic painful palpation.  Assessment: Pain in limb secondary to onychomycosis and porokeratosis.  Plan: Debridement of toenails 1 through 5 bilaterally.  Debridement of all reactive hyperkeratotic tissue.  Follow-up with her in 3 months

## 2019-06-07 DIAGNOSIS — R600 Localized edema: Secondary | ICD-10-CM | POA: Diagnosis not present

## 2019-06-07 DIAGNOSIS — M79661 Pain in right lower leg: Secondary | ICD-10-CM | POA: Diagnosis not present

## 2019-06-07 DIAGNOSIS — Z9181 History of falling: Secondary | ICD-10-CM | POA: Diagnosis not present

## 2019-06-07 DIAGNOSIS — M6281 Muscle weakness (generalized): Secondary | ICD-10-CM | POA: Diagnosis not present

## 2019-06-07 DIAGNOSIS — M79604 Pain in right leg: Secondary | ICD-10-CM | POA: Diagnosis not present

## 2019-06-07 DIAGNOSIS — R2681 Unsteadiness on feet: Secondary | ICD-10-CM | POA: Diagnosis not present

## 2019-06-08 ENCOUNTER — Other Ambulatory Visit: Payer: Self-pay | Admitting: Allergy and Immunology

## 2019-06-12 DIAGNOSIS — M6281 Muscle weakness (generalized): Secondary | ICD-10-CM | POA: Diagnosis not present

## 2019-06-12 DIAGNOSIS — R2681 Unsteadiness on feet: Secondary | ICD-10-CM | POA: Diagnosis not present

## 2019-06-12 DIAGNOSIS — M79604 Pain in right leg: Secondary | ICD-10-CM | POA: Diagnosis not present

## 2019-06-12 DIAGNOSIS — Z9181 History of falling: Secondary | ICD-10-CM | POA: Diagnosis not present

## 2019-06-12 DIAGNOSIS — R600 Localized edema: Secondary | ICD-10-CM | POA: Diagnosis not present

## 2019-06-12 DIAGNOSIS — M79661 Pain in right lower leg: Secondary | ICD-10-CM | POA: Diagnosis not present

## 2019-06-16 ENCOUNTER — Encounter (HOSPITAL_COMMUNITY): Payer: Self-pay

## 2019-06-16 ENCOUNTER — Other Ambulatory Visit: Payer: Self-pay

## 2019-06-16 ENCOUNTER — Ambulatory Visit (INDEPENDENT_AMBULATORY_CARE_PROVIDER_SITE_OTHER)
Admission: EM | Admit: 2019-06-16 | Discharge: 2019-06-16 | Disposition: A | Discharge status: Home / Self Care | Payer: MEDICARE | Source: Home / Self Care

## 2019-06-16 DIAGNOSIS — J189 Pneumonia, unspecified organism: Secondary | ICD-10-CM

## 2019-06-16 DIAGNOSIS — R05 Cough: Secondary | ICD-10-CM | POA: Diagnosis not present

## 2019-06-16 DIAGNOSIS — R5383 Other fatigue: Secondary | ICD-10-CM

## 2019-06-16 DIAGNOSIS — R0981 Nasal congestion: Secondary | ICD-10-CM

## 2019-06-16 DIAGNOSIS — U071 COVID-19: Secondary | ICD-10-CM | POA: Diagnosis not present

## 2019-06-16 LAB — POC SARS CORONAVIRUS 2 AG -  ED: SARS Coronavirus 2 Ag: NEGATIVE

## 2019-06-16 MED ORDER — AZITHROMYCIN 250 MG PO TABS: 250.0000 mg | ORAL_TABLET | Freq: Every day | ORAL | 0 refills | Status: DC

## 2019-06-16 NOTE — ED Triage Notes (Signed)
Pt present diarrhea, stomach pain and weakness. Symptoms started Thanksgiving night.

## 2019-06-16 NOTE — Discharge Instructions (Signed)
Person Under Monitoring Name: [name]  Location: [id number] [place] [address]   Infection Prevention Recommendations for Individuals Confirmed to have, or Being Evaluated for, 2019 Novel Coronavirus (COVID-19) Infection Who Receive Care at Home  Individuals who are confirmed to have, or are being evaluated for, COVID-19 should follow the prevention steps below until a healthcare provider or local or state health department says they can return to normal activities.  Stay home except to get medical care You should restrict activities outside your home, except for getting medical care. Do not go to work, school, or public areas, and do not use public transportation or taxis.  Call ahead before visiting your doctor Before your medical appointment, call the healthcare provider and tell them that you have, or are being evaluated for, COVID-19 infection. This will help the healthcare providers office take steps to keep other people from getting infected. Ask your healthcare provider to call the local or state health department.  Monitor your symptoms Seek prompt medical attention if your illness is worsening (e.g., difficulty breathing). Before going to your medical appointment, call the healthcare provider and tell them that you have, or are being evaluated for, COVID-19 infection. Ask your healthcare provider to call the local or state health department.  Wear a facemask You should wear a facemask that covers your nose and mouth when you are in the same room with other people and when you visit a healthcare provider. People who live with or visit you should also wear a facemask while they are in the same room with you.  Separate yourself from other people in your home As much as possible, you should stay in a different room from other people in your home. Also, you should use a separate bathroom, if available.  Avoid sharing household items You should  not share dishes, drinking glasses, cups, eating utensils, towels, bedding, or other items with other people in your home. After using these items, you should wash them thoroughly with soap and water.  Cover your coughs and sneezes Cover your mouth and nose with a tissue when you cough or sneeze, or you can cough or sneeze into your sleeve. Throw used tissues in a lined trash can, and immediately wash your hands with soap and water for at least 20 seconds or use an alcohol-based hand rub.  Wash your [facility] your hands often and thoroughly with soap and water for at least 20 seconds. You can use an alcohol-based hand sanitizer if soap and water are not available and if your hands are not visibly dirty. Avoid touching your eyes, nose, and mouth with unwashed hands.   Prevention Steps for Caregivers and Household Members of Individuals Confirmed to have, or Being Evaluated for, COVID-19 Infection Being Cared for in the Home  If you live with, or provide care at home for, a person confirmed to have, or being evaluated for, COVID-19 infection please follow these guidelines to prevent infection:  Follow healthcare providers instructions Make sure that you understand and can help the patient follow any healthcare provider instructions for all care.  Provide for the patients basic needs You should help the patient with basic needs in the home and provide support for getting groceries, prescriptions, and other personal needs.  Monitor the patients symptoms If they are getting sicker, call his or her medical provider and tell them that the patient has, or is being evaluated for, COVID-19 infection. This will help the healthcare  providers office take steps to keep other people from getting infected. Ask the healthcare provider to call the local or state health department.  Limit the number of people who have contact with the patient If possible, have only one caregiver for the  patient. Other household members should stay in another home or place of residence. If this is not possible, they should stay in another room, or be separated from the patient as much as possible. Use a separate bathroom, if available. Restrict visitors who do not have an essential need to be in the home.  Keep older adults, very young children, and other sick people away from the patient Keep older adults, very young children, and those who have compromised immune systems or chronic health conditions away from the patient. This includes people with chronic heart, lung, or kidney conditions, diabetes, and cancer.  Ensure good ventilation Make sure that shared spaces in the home have good air flow, such as from an air conditioner or an opened window, weather permitting.  Wash your hands often Wash your hands often and thoroughly with soap and water for at least 20 seconds. You can use an alcohol based hand sanitizer if soap and water are not available and if your hands are not visibly dirty. Avoid touching your eyes, nose, and mouth with unwashed hands. Use disposable paper towels to dry your hands. If not available, use dedicated cloth towels and replace them when they become wet.  Wear a facemask and gloves Wear a disposable facemask at all times in the room and gloves when you touch or have contact with the patients blood, body fluids, and/or secretions or excretions, such as sweat, saliva, sputum, nasal mucus, vomit, urine, or feces.  Ensure the mask fits over your nose and mouth tightly, and do not touch it during use. Throw out disposable facemasks and gloves after using them. Do not reuse. Wash your hands immediately after removing your facemask and gloves. If your personal clothing becomes contaminated, carefully remove clothing and launder. Wash your hands after handling contaminated clothing. Place all used disposable facemasks, gloves, and other waste in a lined container before  disposing them with other household waste. Remove gloves and wash your hands immediately after handling these items.  Do not share dishes, glasses, or other household items with the patient Avoid sharing household items. You should not share dishes, drinking glasses, cups, eating utensils, towels, bedding, or other items with a patient who is confirmed to have, or being evaluated for, COVID-19 infection. After the person uses these items, you should wash them thoroughly with soap and water.  Wash laundry thoroughly Immediately remove and wash clothes or bedding that have blood, body fluids, and/or secretions or excretions, such as sweat, saliva, sputum, nasal mucus, vomit, urine, or feces, on them. Wear gloves when handling laundry from the patient. Read and follow directions on labels of laundry or clothing items and detergent. In general, wash and dry with the warmest temperatures recommended on the label.  Clean all areas the individual has used often Clean all touchable surfaces, such as counters, tabletops, doorknobs, bathroom fixtures, toilets, phones, keyboards, tablets, and bedside tables, every day. Also, clean any surfaces that may have blood, body fluids, and/or secretions or excretions on them. Wear gloves when cleaning surfaces the patient has come in contact with. Use a diluted bleach solution (e.g., dilute bleach with 1 part bleach and 10 parts water) or a household disinfectant with a label that says EPA-registered for coronaviruses. To make  a bleach solution at home, add 1 tablespoon of bleach to 1 quart (4 cups) of water. For a larger supply, add  cup of bleach to 1 gallon (16 cups) of water. Read labels of cleaning products and follow recommendations provided on product labels. Labels contain instructions for safe and effective use of the cleaning product including precautions you should take when applying the product, such as wearing gloves or eye protection and making sure you  have good ventilation during use of the product. Remove gloves and wash hands immediately after cleaning.  Monitor yourself for signs and symptoms of illness Caregivers and household members are considered close contacts, should monitor their health, and will be asked to limit movement outside of the home to the extent possible. Follow the monitoring steps for close contacts listed on the symptom monitoring form.   ? If you have additional questions, contact your local health department or call the epidemiologist on call at [phone] (available 24/7). ? This guidance is subject to change. For the most up-to-date guidance from [facility], please refer to their website: [email or web address].pl

## 2019-06-16 NOTE — ED Notes (Signed)
Rapid covid test result is positive. Error in Epic. Point of care team will fix Monday to correct results.

## 2019-06-17 ENCOUNTER — Telehealth: Payer: Self-pay | Admitting: *Deleted

## 2019-06-17 NOTE — ED Provider Notes (Signed)
[facility]    CSN: [id number] Arrival date & time: 06/16/19  1334      History   Chief Complaint Chief Complaint  Patient presents with  . Diarrhea  . Abdominal Pain  . Weakness    HPI [name] is a 85 y.o. female history of breast cancer, hypertension, allergic rhinitis, presenting today for evaluation of fatigue.  Patient states that Thursday evening she began to feel very fatigued which has progressed over the past 2 days.  She has had some looser stools and occasional abdominal discomfort.  She denies abdominal pain at time of visit.  Has had 1 bowel movement today which was looser.  She had one episode of vomiting Thursday evening, but denies persistent nausea/vomiting.  She notes that she has had some congestion and cough, but is attributed this to her "hayfever" and allergies.  Her husband does note that her cough has worsened of recently.  She has had decreased energy level and difficulty doing things she normally would be able to do without assistance.  They live in an independent living facility at the friend's home.  They deny any known exposures.  HPI  Past Medical History:  Diagnosis Date  . Allergic rhinitis, seasonal   . Breast cancer of upper-outer quadrant of right female breast ([place]) 09/13/2014  . Chronic venous insufficiency   . Dermatophytosis of nail   . Dysrhythmia    H/o Atrial fibrillation  . Edema leg   . Family history of malignant neoplasm of breast   . Family history of malignant neoplasm of ovary   . Goiter    right thyroid nodule   . HTN (hypertension) 08/13/2017  . HX: breast cancer    bilateral  . Menopausal syndrome   . Osteoarthritis   . Osteoporosis   . Squamous cell skin cancer    right lower leg  . Wears glasses   . Wears hearing aid    both ears    Patient Active Problem List   Diagnosis Date Noted  . Breast cancer, stage 1, left ([place]) 03/22/2019  . Hypercholesterolemia 12/02/2017  . Coronary artery disease  involving native coronary artery of native heart without angina pectoris 12/02/2017  . Chronic diastolic heart failure ([place]) 12/02/2017  . Aortic atherosclerosis ([place]) 10/19/2017  . Paroxysmal atrial fibrillation ([place]) 10/04/2017  . Non-ST elevated myocardial infarction ([place]) 08/26/2017  . Near syncope 08/25/2017  . Atrial fibrillation with RVR ([place]) 08/25/2017  . Elevated troponin 08/25/2017  . Grade II diastolic dysfunction 123456  . Acute on chronic cholecystitis 08/13/2017  . Essential hypertension 08/13/2017  . Anemia 08/13/2017  . GERD (gastroesophageal reflux disease) 08/13/2017  . Acute cholecystitis 08/12/2017  . Genetic testing 10/10/2014  . Family history of malignant neoplasm of breast   . Family history of malignant neoplasm of ovary   . Breast cancer of upper-outer quadrant of right female breast ([place]) 09/13/2014  . History of esophageal stricture 07/25/2014  . Dermatophytosis of nail   . RHUS DERMATITIS 05/27/2010  . Osteoporosis 04/14/2010  . VAGINITIS, ATROPHIC 10/28/2009  . DYSURIA 10/28/2009  . GOITER, MULTINODULAR 03/05/2009  . THYROID NODULE, RIGHT 02/05/2009  . Venous (peripheral) insufficiency 02/05/2009  . SENILE OSTEOPOROSIS 01/24/2008  . URI 10/18/2007  . CUTANEOUS ERUPTIONS, DRUG-INDUCED 02/09/2007  . BREAST CANCER, HX OF 02/09/2007  . Allergic rhinitis 02/02/2007  . Osteoarthritis 02/02/2007    Past Surgical History:  Procedure Laterality Date  . bilateral lumpectomies for breast cancer  Aug. 2007  .  BREAST LUMPECTOMY WITH RADIOACTIVE SEED LOCALIZATION Right 10/01/2014   Procedure: BREAST LUMPECTOMY WITH RADIOACTIVE SEED LOCALIZATION;  Surgeon: Erroll Luna, MD;  Location: [facility];  Service: General;  Laterality: Right;  . BREAST LUMPECTOMY WITH RADIOACTIVE SEED LOCALIZATION Left 03/22/2019   Procedure: LEFT BREAST LUMPECTOMY WITH RADIOACTIVE SEED LOCALIZATION;  Surgeon: Erroll Luna, MD;  Location: [place];  Service: General;   Laterality: Left;  . CATARACT EXTRACTION    . CHOLECYSTECTOMY N/A 08/13/2017   Procedure: LAPAROSCOPIC CHOLECYSTECTOMY WITH INTRAOPERATIVE CHOLANGIOGRAM;  Surgeon: Excell Seltzer, MD;  Location: [facility];  Service: General;  Laterality: N/A;  . DILATION AND CURETTAGE OF UTERUS    . ESOPHAGOGASTRODUODENOSCOPY  07/26/2011   Procedure: ESOPHAGOGASTRODUODENOSCOPY (EGD);  Surgeon: Jeryl Columbia, MD;  Location: [facility];  Service: Endoscopy;  Laterality: N/A;  . history of lumbar compression fracture    . left eardum sx  1983  . left knee arthroscopic surgery    . no screening colonoscopy    . s/p bilateral lumpectomies    . SAVORY DILATION  07/26/2011   Procedure: SAVORY DILATION;  Surgeon: Jeryl Columbia, MD;  Location: [facility];  Service: Endoscopy;  Laterality: N/A;  . status post resection squamous cell cancer right lower leg    . TONSILLECTOMY    . UMBILICAL HERNIA REPAIR      OB History   No obstetric history on file.      Home Medications    Prior to Admission medications   Medication Sig Start Date End Date Taking? Authorizing Provider  acetaminophen (TYLENOL) 325 MG tablet Take 2 tablets (650 mg total) by mouth every 6 (six) hours as needed for mild pain (or Fever >/= 101). 03/23/19   Cornett, [name], MD  apixaban (ELIQUIS) 5 MG TABS tablet Take 1 tablet (5 mg total) by mouth 2 (two) times daily. 12/02/17   Croitoru, Mihai, MD  atorvastatin (LIPITOR) 40 MG tablet TAKE 1 TABLET BY MOUTH EVERY DAY 05/25/19   Croitoru, Mihai, MD  azithromycin (ZITHROMAX) 250 MG tablet Take 1 tablet (250 mg total) by mouth daily. Take first 2 tablets together, then 1 every day until finished. 06/16/19   Wieters, Hallie C, PA-C  furosemide (LASIX) 20 MG tablet Take 1 tablet (20 mg total) by mouth daily. 12/05/18   Croitoru, Mihai, MD  ipratropium (ATROVENT) 0.06 % nasal spray PLACE 2 SPRAYS INTO BOTH NOSTRILS 4 (FOUR) TIMES DAILY. 06/08/19   Kozlow, Donnamarie Poag, MD  metoprolol succinate (TOPROL-XL) 25 MG  24 hr tablet Take 25 mg by mouth at bedtime.    [provider]  metoprolol succinate (TOPROL-XL) 50 MG 24 hr tablet Take 1 tablet (50 mg total) by mouth at bedtime. Take with or immediately following a meal. CALL FOR VIRTUAL VISIT 02/12/19   Croitoru, Dani Speagle, MD  montelukast (SINGULAIR) 10 MG tablet TAKE 1 TABLET BY MOUTH EVERYDAY AT BEDTIME 06/04/19   Kozlow, Donnamarie Poag, MD  Multiple Vitamins-Minerals (PRESERVISION AREDS 2) CAPS Take 1 capsule by mouth 2 (two) times daily.     [provider]  Polyethyl Glycol-Propyl Glycol (SYSTANE OP) Place 1 drop into both eyes 2 (two) times daily as needed (dry eyes).    [provider]    Family History Family History  Problem Relation Age of Onset  . Cancer Mother 79       breast  . Heart attack Father   . Heart attack Brother   . Heart attack Brother   . Cancer Maternal Aunt 92  breast  . Coronary artery disease Other   . Cancer Other        ovarian; daughter of mat aunt w/ BC in 48s  . Heart disease Brother   . Cancer Maternal Aunt 13       breast    Social History Social History   Tobacco Use  . Smoking status: Never Smoker  . Smokeless tobacco: Never Used  Substance Use Topics  . Alcohol use: No  . Drug use: No     Allergies   Celecoxib   Review of Systems Review of Systems  Constitutional: Positive for appetite change and fatigue. Negative for activity change, chills and fever.  HENT: Positive for congestion and rhinorrhea. Negative for ear pain, sinus pressure, sore throat and trouble swallowing.   Eyes: Negative for discharge and redness.  Respiratory: Positive for cough. Negative for chest tightness and shortness of breath.   Cardiovascular: Negative for chest pain.  Gastrointestinal: Positive for diarrhea. Negative for abdominal pain, nausea and vomiting.  Musculoskeletal: Negative for myalgias.  Skin: Negative for rash.  Neurological: Negative for dizziness, light-headedness and headaches.      Physical Exam Triage Vital Signs ED Triage Vitals  Enc Vitals Group     BP 06/16/19 1448 (!) 158/52     Pulse Rate 06/16/19 1448 82     Resp 06/16/19 1448 16     Temp 06/16/19 1448 99.1 F (37.3 C)     Temp Source 06/16/19 1448 Oral     SpO2 06/16/19 1448 100 %     Weight --      Height --      Head Circumference --      Peak Flow --      Pain Score 06/16/19 1449 4     Pain Loc --      Pain Edu? --      Excl. in [place]? --    No data found.  Updated Vital Signs BP (!) 158/52 (BP Location: Left Arm)   Pulse 82   Temp 99.1 F (37.3 C) (Oral)   Resp 16   SpO2 100%   Visual Acuity Right Eye Distance:   Left Eye Distance:   Bilateral Distance:    Right Eye Near:   Left Eye Near:    Bilateral Near:     Physical Exam Vitals signs and nursing note reviewed.  Constitutional:      Appearance: She is well-developed.     Comments: No acute distress  HENT:     Head: Normocephalic and atraumatic.     Ears:     Comments: Bilateral ears without tenderness to palpation of external auricle, tragus and mastoid, EAC's without erythema or swelling, TM's with good bony landmarks and cone of light. Non erythematous.    Nose: Nose normal.     Mouth/Throat:     Comments: Oral mucosa pink, tongue appears dry, no tonsillar enlargement or exudate. Posterior pharynx patent and nonerythematous, no uvula deviation or swelling. Normal phonation.  Eyes:     Extraocular Movements: Extraocular movements intact.     Conjunctiva/sclera: Conjunctivae normal.     Pupils: Pupils are equal, round, and reactive to light.     Comments: Corneal arcus present, wearing glasses  Neck:     Musculoskeletal: Neck supple.  Cardiovascular:     Rate and Rhythm: Normal rate.  Pulmonary:     Effort: Pulmonary effort is normal. No respiratory distress.     Breath sounds: Rales present.  Comments: Crackles noted to right base Abdominal:     General: There is no distension.  Musculoskeletal: Normal  range of motion.  Skin:    General: Skin is warm and dry.  Neurological:     Mental Status: She is alert and oriented to person, place, and time.      UC Treatments / Results  Labs (all labs ordered are listed, but only abnormal results are displayed) Labs Reviewed  POC SARS CORONAVIRUS 2 AG -  ED  POC SARS CORONAVIRUS 2 AG    EKG   Radiology No results found.  Procedures Procedures (including critical care time)  Medications Ordered in UC Medications - No data to display  Initial Impression / Assessment and Plan / UC Course  I have reviewed the triage vital signs and the nursing notes.  Pertinent labs & imaging results that were available during my care of the patient were reviewed by me and considered in my medical decision making (see chart for details).     Point-of-care Covid positive, high suspicion of early Covid pneumonia given crackles auscultated.  Vital signs are stable without tachycardia or hypoxia at this time.  We will go ahead and provide azithromycin to cover atypicals.  Rest, push fluids.  Continue to monitor closely, recommended following up in emergency room if symptoms progressing or worsening given her age as well as further evaluation for possible admission.  Discussed strict return precautions. Patient verbalized understanding and is agreeable with plan.  Final Clinical Impressions(s) / UC Diagnoses   Final diagnoses:  [id number] virus detected  Fatigue, unspecified type  Atypical pneumonia     Discharge Instructions        Person Under Monitoring Name: MAGDA BOLENBAUGH  Location: [id number] [place] [id number]   Infection Prevention Recommendations for Individuals Confirmed to have, or Being Evaluated for, 2019 Novel Coronavirus (COVID-19) Infection Who Receive Care at Home  Individuals who are confirmed to have, or are being evaluated for, COVID-19 should follow the prevention steps below until a healthcare provider  or local or state health department says they can return to normal activities.  Stay home except to get medical care You should restrict activities outside your home, except for getting medical care. Do not go to work, school, or public areas, and do not use public transportation or taxis.  Call ahead before visiting your doctor Before your medical appointment, call the healthcare provider and tell them that you have, or are being evaluated for, COVID-19 infection. This will help the healthcare provider's office take steps to keep other people from getting infected. Ask your healthcare provider to call the local or state health department.  Monitor your symptoms Seek prompt medical attention if your illness is worsening (e.g., difficulty breathing). Before going to your medical appointment, call the healthcare provider and tell them that you have, or are being evaluated for, COVID-19 infection. Ask your healthcare provider to call the local or state health department.  Wear a facemask You should wear a facemask that covers your nose and mouth when you are in the same room with other people and when you visit a healthcare provider. People who live with or visit you should also wear a facemask while they are in the same room with you.  Separate yourself from other people in your home As much as possible, you should stay in a different room from other people in your home. Also, you should use a separate bathroom, if available.  Avoid sharing household items You should not share dishes, drinking glasses, cups, eating utensils, towels, bedding, or other items with other people in your home. After using these items, you should wash them thoroughly with soap and water.  Cover your coughs and sneezes Cover your mouth and nose with a tissue when you cough or sneeze, or you can cough or sneeze into your sleeve. Throw used tissues in a lined trash can, and immediately wash your hands with soap  and water for at least 20 seconds or use an alcohol-based hand rub.  Wash your [facility] your hands often and thoroughly with soap and water for at least 20 seconds. You can use an alcohol-based hand sanitizer if soap and water are not available and if your hands are not visibly dirty. Avoid touching your eyes, nose, and mouth with unwashed hands.   Prevention Steps for Caregivers and Household Members of Individuals Confirmed to have, or Being Evaluated for, COVID-19 Infection Being Cared for in the Home  If you live with, or provide care at home for, a person confirmed to have, or being evaluated for, COVID-19 infection please follow these guidelines to prevent infection:  Follow healthcare provider's instructions Make sure that you understand and can help the patient follow any healthcare provider instructions for all care.  Provide for the patient's basic needs You should help the patient with basic needs in the home and provide support for getting groceries, prescriptions, and other personal needs.  Monitor the patient's symptoms If they are getting sicker, call his or her medical provider and tell them that the patient has, or is being evaluated for, COVID-19 infection. This will help the healthcare provider's office take steps to keep other people from getting infected. Ask the healthcare provider to call the local or state health department.  Limit the number of people who have contact with the patient  If possible, have only one caregiver for the patient.  Other household members should stay in another home or place of residence. If this is not possible, they should stay  in another room, or be separated from the patient as much as possible. Use a separate bathroom, if available.  Restrict visitors who do not have an essential need to be in the home.  Keep older adults, very young children, and other sick people away from the patient Keep older adults, very young  children, and those who have compromised immune systems or chronic health conditions away from the patient. This includes people with chronic heart, lung, or kidney conditions, diabetes, and cancer.  Ensure good ventilation Make sure that shared spaces in the home have good air flow, such as from an air conditioner or an opened window, weather permitting.  Wash your hands often  Wash your hands often and thoroughly with soap and water for at least 20 seconds. You can use an alcohol based hand sanitizer if soap and water are not available and if your hands are not visibly dirty.  Avoid touching your eyes, nose, and mouth with unwashed hands.  Use disposable paper towels to dry your hands. If not available, use dedicated cloth towels and replace them when they become wet.  Wear a facemask and gloves  Wear a disposable facemask at all times in the room and gloves when you touch or have contact with the patient's blood, body fluids, and/or secretions or excretions, such as sweat, saliva, sputum, nasal mucus, vomit, urine, or feces.  Ensure the mask fits over your nose and  mouth tightly, and do not touch it during use.  Throw out disposable facemasks and gloves after using them. Do not reuse.  Wash your hands immediately after removing your facemask and gloves.  If your personal clothing becomes contaminated, carefully remove clothing and launder. Wash your hands after handling contaminated clothing.  Place all used disposable facemasks, gloves, and other waste in a lined container before disposing them with other household waste.  Remove gloves and wash your hands immediately after handling these items.  Do not share dishes, glasses, or other household items with the patient  Avoid sharing household items. You should not share dishes, drinking glasses, cups, eating utensils, towels, bedding, or other items with a patient who is confirmed to have, or being evaluated for, COVID-19 infection.   After the person uses these items, you should wash them thoroughly with soap and water.  Wash laundry thoroughly  Immediately remove and wash clothes or bedding that have blood, body fluids, and/or secretions or excretions, such as sweat, saliva, sputum, nasal mucus, vomit, urine, or feces, on them.  Wear gloves when handling laundry from the patient.  Read and follow directions on labels of laundry or clothing items and detergent. In general, wash and dry with the warmest temperatures recommended on the label.  Clean all areas the individual has used often  Clean all touchable surfaces, such as counters, tabletops, doorknobs, bathroom fixtures, toilets, phones, keyboards, tablets, and bedside tables, every day. Also, clean any surfaces that may have blood, body fluids, and/or secretions or excretions on them.  Wear gloves when cleaning surfaces the patient has come in contact with.  Use a diluted bleach solution (e.g., dilute bleach with 1 part bleach and 10 parts water) or a household disinfectant with a label that says EPA-registered for coronaviruses. To make a bleach solution at home, add 1 tablespoon of bleach to 1 quart (4 cups) of water. For a larger supply, add  cup of bleach to 1 gallon (16 cups) of water.  Read labels of cleaning products and follow recommendations provided on product labels. Labels contain instructions for safe and effective use of the cleaning product including precautions you should take when applying the product, such as wearing gloves or eye protection and making sure you have good ventilation during use of the product.  Remove gloves and wash hands immediately after cleaning.  Monitor yourself for signs and symptoms of illness Caregivers and household members are considered close contacts, should monitor their health, and will be asked to limit movement outside of the home to the extent possible. Follow the monitoring steps for close contacts listed on  the symptom monitoring form.   ? If you have additional questions, contact your local health department or call the epidemiologist on call at [phone] (available 24/7). ? This guidance is subject to change. For the most up-to-date guidance from [facility], please refer to their website: YouBlogs.pl    ED Prescriptions    Medication Sig Dispense Auth. Provider   azithromycin (ZITHROMAX) 250 MG tablet Take 1 tablet (250 mg total) by mouth daily. Take first 2 tablets together, then 1 every day until finished. 6 tablet Wieters, [place] C, PA-C     PDMP not reviewed this encounter.   Janith Lima, [place] 06/17/19 [phone]

## 2019-06-17 NOTE — Telephone Encounter (Signed)
Patient husband called after hours line. Patient husband reports   his wife tested positive for covid19  yesterday and he is wanting to see if the doctor can order a Medicare quarantine bed for her at the [facility] [place]. Caller states his wife was having some vomiting earlier and he would like to see about getting medication for this. Caller states she is also very fatigued. EMS came out last night and her vital signs were good and they were told she did not need to go into the hospital. Caller states they are currently at [facility] and he wanted to see if they can have transportation to bring her to the [place] location if possible.   Labs did not show positive POC COVID 19 test. Please advise

## 2019-06-18 ENCOUNTER — Telehealth: Payer: Self-pay | Admitting: Internal Medicine

## 2019-06-18 ENCOUNTER — Ambulatory Visit: Payer: Self-pay

## 2019-06-18 LAB — POC SARS CORONAVIRUS 2 AG: SARS Coronavirus 2 Ag: POSITIVE — AB

## 2019-06-18 LAB — POC SARS CORONAVIRUS 2 AG -  ED: SARS Coronavirus 2 Ag: POSITIVE — AB

## 2019-06-18 NOTE — Telephone Encounter (Signed)
Robyn Calling from [facility] in [place] . Is calling to request a FL-2 to for the patient. The patient tested positive for COVID the isolation unit is at [facility] on [address]. It is a skilled Unit. If in need of a FL-2 Bailey Mech can fax  Any question please call Raymon [phone]

## 2019-06-18 NOTE — Telephone Encounter (Signed)
Dr. Martinique has agreed to fill out FL2 for this emergency situation

## 2019-06-18 NOTE — Telephone Encounter (Signed)
Provided Covid-19 to [employer] voiced  Upstanding. Sherry Hinon  Ask was there a chest xray done.  Husband  States that it was not done.

## 2019-06-18 NOTE — Telephone Encounter (Signed)
Form completed and faxed over to [phone]. Called Robyn and notified her

## 2019-06-19 ENCOUNTER — Emergency Department (HOSPITAL_COMMUNITY): Payer: MEDICARE

## 2019-06-19 ENCOUNTER — Encounter (HOSPITAL_COMMUNITY): Payer: Self-pay | Admitting: Emergency Medicine

## 2019-06-19 ENCOUNTER — Non-Acute Institutional Stay (SKILLED_NURSING_FACILITY): Payer: MEDICARE | Admitting: Internal Medicine

## 2019-06-19 ENCOUNTER — Other Ambulatory Visit: Payer: Self-pay

## 2019-06-19 ENCOUNTER — Telehealth: Payer: Self-pay | Admitting: *Deleted

## 2019-06-19 ENCOUNTER — Ambulatory Visit: Payer: MEDICARE | Admitting: Cardiovascular Disease

## 2019-06-19 ENCOUNTER — Encounter: Payer: Self-pay | Admitting: Internal Medicine

## 2019-06-19 ENCOUNTER — Inpatient Hospital Stay (HOSPITAL_COMMUNITY)
Admission: EM | Admit: 2019-06-19 | Discharge: 2019-06-27 | DRG: 177 | Disposition: A | Discharge status: Skilled Nursing Facility | Payer: MEDICARE | Source: Skilled Nursing Facility | Attending: Student | Admitting: Student

## 2019-06-19 DIAGNOSIS — I480 Paroxysmal atrial fibrillation: Secondary | ICD-10-CM | POA: Diagnosis not present

## 2019-06-19 DIAGNOSIS — E782 Mixed hyperlipidemia: Secondary | ICD-10-CM | POA: Diagnosis not present

## 2019-06-19 DIAGNOSIS — U071 COVID-19: Secondary | ICD-10-CM | POA: Diagnosis present

## 2019-06-19 DIAGNOSIS — E875 Hyperkalemia: Secondary | ICD-10-CM | POA: Diagnosis not present

## 2019-06-19 DIAGNOSIS — I110 Hypertensive heart disease with heart failure: Secondary | ICD-10-CM | POA: Diagnosis present

## 2019-06-19 DIAGNOSIS — H9190 Unspecified hearing loss, unspecified ear: Secondary | ICD-10-CM | POA: Diagnosis present

## 2019-06-19 DIAGNOSIS — J1282 Pneumonia due to coronavirus disease 2019: Secondary | ICD-10-CM

## 2019-06-19 DIAGNOSIS — I2510 Atherosclerotic heart disease of native coronary artery without angina pectoris: Secondary | ICD-10-CM | POA: Diagnosis present

## 2019-06-19 DIAGNOSIS — D649 Anemia, unspecified: Secondary | ICD-10-CM | POA: Diagnosis present

## 2019-06-19 DIAGNOSIS — R945 Abnormal results of liver function studies: Secondary | ICD-10-CM | POA: Diagnosis present

## 2019-06-19 DIAGNOSIS — R1310 Dysphagia, unspecified: Secondary | ICD-10-CM | POA: Diagnosis not present

## 2019-06-19 DIAGNOSIS — E876 Hypokalemia: Secondary | ICD-10-CM | POA: Diagnosis present

## 2019-06-19 DIAGNOSIS — J40 Bronchitis, not specified as acute or chronic: Secondary | ICD-10-CM | POA: Diagnosis not present

## 2019-06-19 DIAGNOSIS — Z7901 Long term (current) use of anticoagulants: Secondary | ICD-10-CM

## 2019-06-19 DIAGNOSIS — R0602 Shortness of breath: Secondary | ICD-10-CM | POA: Diagnosis not present

## 2019-06-19 DIAGNOSIS — I4891 Unspecified atrial fibrillation: Secondary | ICD-10-CM | POA: Diagnosis not present

## 2019-06-19 DIAGNOSIS — I25119 Atherosclerotic heart disease of native coronary artery with unspecified angina pectoris: Secondary | ICD-10-CM

## 2019-06-19 DIAGNOSIS — Z20828 Contact with and (suspected) exposure to other viral communicable diseases: Secondary | ICD-10-CM | POA: Diagnosis not present

## 2019-06-19 DIAGNOSIS — Z7401 Bed confinement status: Secondary | ICD-10-CM | POA: Diagnosis not present

## 2019-06-19 DIAGNOSIS — R29898 Other symptoms and signs involving the musculoskeletal system: Secondary | ICD-10-CM | POA: Diagnosis not present

## 2019-06-19 DIAGNOSIS — R112 Nausea with vomiting, unspecified: Secondary | ICD-10-CM | POA: Diagnosis not present

## 2019-06-19 DIAGNOSIS — J1289 Other viral pneumonia: Secondary | ICD-10-CM

## 2019-06-19 DIAGNOSIS — I5032 Chronic diastolic (congestive) heart failure: Secondary | ICD-10-CM | POA: Diagnosis present

## 2019-06-19 DIAGNOSIS — Z8249 Family history of ischemic heart disease and other diseases of the circulatory system: Secondary | ICD-10-CM | POA: Diagnosis not present

## 2019-06-19 DIAGNOSIS — J9601 Acute respiratory failure with hypoxia: Secondary | ICD-10-CM | POA: Diagnosis not present

## 2019-06-19 DIAGNOSIS — Z853 Personal history of malignant neoplasm of breast: Secondary | ICD-10-CM

## 2019-06-19 DIAGNOSIS — R509 Fever, unspecified: Secondary | ICD-10-CM | POA: Diagnosis not present

## 2019-06-19 DIAGNOSIS — E118 Type 2 diabetes mellitus with unspecified complications: Secondary | ICD-10-CM | POA: Diagnosis not present

## 2019-06-19 DIAGNOSIS — M6281 Muscle weakness (generalized): Secondary | ICD-10-CM | POA: Diagnosis not present

## 2019-06-19 DIAGNOSIS — E1165 Type 2 diabetes mellitus with hyperglycemia: Secondary | ICD-10-CM | POA: Diagnosis not present

## 2019-06-19 DIAGNOSIS — I5030 Unspecified diastolic (congestive) heart failure: Secondary | ICD-10-CM | POA: Diagnosis not present

## 2019-06-19 DIAGNOSIS — I959 Hypotension, unspecified: Secondary | ICD-10-CM | POA: Diagnosis not present

## 2019-06-19 DIAGNOSIS — I10 Essential (primary) hypertension: Secondary | ICD-10-CM

## 2019-06-19 DIAGNOSIS — R0902 Hypoxemia: Secondary | ICD-10-CM | POA: Diagnosis not present

## 2019-06-19 DIAGNOSIS — M2550 Pain in unspecified joint: Secondary | ICD-10-CM | POA: Diagnosis not present

## 2019-06-19 DIAGNOSIS — E872 Acidosis: Secondary | ICD-10-CM | POA: Diagnosis present

## 2019-06-19 DIAGNOSIS — I5033 Acute on chronic diastolic (congestive) heart failure: Secondary | ICD-10-CM | POA: Diagnosis not present

## 2019-06-19 DIAGNOSIS — I214 Non-ST elevation (NSTEMI) myocardial infarction: Secondary | ICD-10-CM | POA: Diagnosis not present

## 2019-06-19 DIAGNOSIS — J189 Pneumonia, unspecified organism: Secondary | ICD-10-CM | POA: Diagnosis not present

## 2019-06-19 DIAGNOSIS — R2681 Unsteadiness on feet: Secondary | ICD-10-CM | POA: Diagnosis not present

## 2019-06-19 DIAGNOSIS — I499 Cardiac arrhythmia, unspecified: Secondary | ICD-10-CM | POA: Diagnosis not present

## 2019-06-19 DIAGNOSIS — R05 Cough: Secondary | ICD-10-CM | POA: Diagnosis not present

## 2019-06-19 DIAGNOSIS — R52 Pain, unspecified: Secondary | ICD-10-CM | POA: Diagnosis not present

## 2019-06-19 LAB — CBC WITH DIFFERENTIAL/PLATELET
Abs Immature Granulocytes: 0.08 10*3/uL — ABNORMAL HIGH (ref 0.00–0.07)
Basophils Absolute: 0 10*3/uL (ref 0.0–0.1)
Basophils Relative: 0 %
Eosinophils Absolute: 0 10*3/uL (ref 0.0–0.5)
Eosinophils Relative: 0 %
HCT: 35.3 % — ABNORMAL LOW (ref 36.0–46.0)
Hemoglobin: 11.4 g/dL — ABNORMAL LOW (ref 12.0–15.0)
Immature Granulocytes: 1 %
Lymphocytes Relative: 6 %
Lymphs Abs: 0.4 10*3/uL — ABNORMAL LOW (ref 0.7–4.0)
MCH: 30.2 pg (ref 26.0–34.0)
MCHC: 32.3 g/dL (ref 30.0–36.0)
MCV: 93.6 fL (ref 80.0–100.0)
Monocytes Absolute: 0.4 10*3/uL (ref 0.1–1.0)
Monocytes Relative: 6 %
Neutro Abs: 5.2 10*3/uL (ref 1.7–7.7)
Neutrophils Relative %: 87 %
Platelets: 164 10*3/uL (ref 150–400)
RBC: 3.77 MIL/uL — ABNORMAL LOW (ref 3.87–5.11)
RDW: 13.2 % (ref 11.5–15.5)
WBC: 6 10*3/uL (ref 4.0–10.5)
nRBC: 0 % (ref 0.0–0.2)

## 2019-06-19 LAB — COMPREHENSIVE METABOLIC PANEL
ALT: 68 U/L — ABNORMAL HIGH (ref 0–44)
AST: 80 U/L — ABNORMAL HIGH (ref 15–41)
Albumin: 2.9 g/dL — ABNORMAL LOW (ref 3.5–5.0)
Alkaline Phosphatase: 91 U/L (ref 38–126)
Anion gap: 14 (ref 5–15)
BUN: 16 mg/dL (ref 8–23)
CO2: 22 mmol/L (ref 22–32)
Calcium: 8 mg/dL — ABNORMAL LOW (ref 8.9–10.3)
Chloride: 102 mmol/L (ref 98–111)
Creatinine, Ser: 0.98 mg/dL (ref 0.44–1.00)
GFR calc Af Amer: 57 mL/min — ABNORMAL LOW (ref >60)
GFR calc non Af Amer: 49 mL/min — ABNORMAL LOW (ref >60)
Glucose, Bld: 148 mg/dL — ABNORMAL HIGH (ref 70–99)
Potassium: 3 mmol/L — ABNORMAL LOW (ref 3.5–5.1)
Sodium: 138 mmol/L (ref 135–145)
Total Bilirubin: 1.1 mg/dL (ref 0.3–1.2)
Total Protein: 6.2 g/dL — ABNORMAL LOW (ref 6.5–8.1)

## 2019-06-19 LAB — LACTIC ACID, PLASMA: Lactic Acid, Venous: 1.1 mmol/L (ref 0.5–1.9)

## 2019-06-19 LAB — FIBRINOGEN: Fibrinogen: 708 mg/dL — ABNORMAL HIGH (ref 210–475)

## 2019-06-19 LAB — C-REACTIVE PROTEIN: CRP: 18.5 mg/dL — ABNORMAL HIGH (ref <1.0)

## 2019-06-19 LAB — D-DIMER, QUANTITATIVE: D-Dimer, Quant: 1.33 ug/mL-FEU — ABNORMAL HIGH (ref 0.00–0.50)

## 2019-06-19 LAB — PROCALCITONIN: Procalcitonin: 0.1 ng/mL

## 2019-06-19 LAB — FERRITIN: Ferritin: 219 ng/mL (ref 11–307)

## 2019-06-19 LAB — LACTATE DEHYDROGENASE: LDH: 270 U/L — ABNORMAL HIGH (ref 98–192)

## 2019-06-19 LAB — TRIGLYCERIDES: Triglycerides: 59 mg/dL (ref <150)

## 2019-06-19 MED ORDER — APIXABAN 5 MG PO TABS
5.0000 mg | ORAL_TABLET | Freq: Two times a day (BID) | ORAL | Status: DC
  Administered 2019-06-20 – 2019-06-27 (×16): 5 mg via ORAL
  Filled 2019-06-19 (×16): qty 1

## 2019-06-19 MED ORDER — POTASSIUM CHLORIDE CRYS ER 20 MEQ PO TBCR
20.0000 meq | EXTENDED_RELEASE_TABLET | Freq: Once | ORAL | Status: AC
  Administered 2019-06-20: 20 meq via ORAL
  Filled 2019-06-19: qty 1

## 2019-06-19 MED ORDER — DEXAMETHASONE SODIUM PHOSPHATE 4 MG/ML IJ SOLN: 4.0000 mg | Freq: Once | INTRAMUSCULAR | Status: DC

## 2019-06-19 MED ORDER — DEXAMETHASONE SODIUM PHOSPHATE 10 MG/ML IJ SOLN
6.0000 mg | Freq: Once | INTRAMUSCULAR | Status: AC
  Administered 2019-06-20: 6 mg via INTRAVENOUS
  Filled 2019-06-19: qty 1

## 2019-06-19 MED ORDER — POTASSIUM CHLORIDE 10 MEQ/100ML IV SOLN
10.0000 meq | INTRAVENOUS | Status: AC
  Administered 2019-06-20 (×2): 10 meq via INTRAVENOUS
  Filled 2019-06-19 (×2): qty 100

## 2019-06-19 MED ORDER — ACETAMINOPHEN 325 MG PO TABS
650.0000 mg | ORAL_TABLET | Freq: Four times a day (QID) | ORAL | Status: DC | PRN
  Administered 2019-06-25: 650 mg via ORAL
  Filled 2019-06-19: qty 2

## 2019-06-19 MED ORDER — ONDANSETRON HCL 4 MG/2ML IJ SOLN: 4.0000 mg | Freq: Four times a day (QID) | INTRAMUSCULAR | Status: DC | PRN

## 2019-06-19 MED ORDER — ONDANSETRON HCL 4 MG PO TABS: 4.0000 mg | ORAL_TABLET | Freq: Four times a day (QID) | ORAL | Status: DC | PRN

## 2019-06-19 NOTE — Progress Notes (Addendum)
Provider:   Location:  [facility] of Service:  SNF (31)  PCP: Isaac Bliss, Rayford Halsted, MD Patient Care Team: Isaac Bliss, Rayford Halsted, MD as PCP - General (Internal Medicine) Croitoru, Dani Lege, MD as PCP - Cardiology (Cardiology) Erroll Luna, MD as Consulting Physician (General Surgery) Magrinat, Virgie Dad, MD as Consulting Physician (Oncology) Arloa Koh, MD (Inactive) as Consulting Physician (Radiation Oncology) [name], RN as Registered Nurse [name], RN as Registered Nurse [name], NP (Inactive) as Nurse Practitioner (Nurse Practitioner) [name], MD as Consulting Physician (Gastroenterology)  Extended Emergency Contact Information Primary Emergency Contact: Oneal Grout Address: [address]          [place]          [place] [address]          [place], [place] [address] [place] Phone: [phone] Relation: Spouse  Code Status:  Goals of Care: Advanced Directive information Advanced Directives 03/16/2019  Does Patient Have a Medical Advance Directive? Yes  Type of Paramedic of [place];Living will  Does patient want to make changes to medical advance directive? No - Patient declined  Copy of [place] in Chart? -  Pre-existing out of facility DNR order (yellow form or pink MOST form) -      Chief Complaint  Patient presents with  . New Admit To SNF    HPI: Patient is a 85 y.o. female seen today for admission to SNF for Higher level of Care and Monitoring Patient has a history of breast cancer s/p lumpectomy, A. fib on chronic Eliquis, coronary artery disease, hypertension, hyperlipidemia, LE edema  Patient was recently seen in urgent care for fatigue, nausea and diarrhea.  Her Covid test came back positive.  She was also started on azithromycin for some coughing and discharged back to her apartment.  She lives with her husband in [place] in  friends home [place].  Her husband is also Covid  positive. The patient continued to have nausea and vomiting and her husband was unable to take care of her.  Now she is admitted in SNF for further monitoring and Care Patient was unable to give me much history because she is hard of hearing.  Could not understand me through the mask.  But she did complain of cough.  No shortness of breath.  Complaining of nausea weakness.  No fever or chest pain.  No abdominal pain no diarrhea.  Appetite is  poor. Past Medical History:  Diagnosis Date  . Allergic rhinitis, seasonal   . Breast cancer of upper-outer quadrant of right female breast ([facility]) 09/13/2014  . Chronic venous insufficiency   . Dermatophytosis of nail   . Dysrhythmia    H/o Atrial fibrillation  . Edema leg   . Family history of malignant neoplasm of breast   . Family history of malignant neoplasm of ovary   . Goiter    right thyroid nodule   . HTN (hypertension) 08/13/2017  . HX: breast cancer    bilateral  . Menopausal syndrome   . Osteoarthritis   . Osteoporosis   . Squamous cell skin cancer    right lower leg  . Wears glasses   . Wears hearing aid    both ears   Past Surgical History:  Procedure Laterality Date  . bilateral lumpectomies for breast cancer  Aug. 2007  . BREAST LUMPECTOMY WITH RADIOACTIVE SEED LOCALIZATION Right 10/01/2014  Procedure: BREAST LUMPECTOMY WITH RADIOACTIVE SEED LOCALIZATION;  Surgeon: Erroll Luna, MD;  Location: [facility];  Service: General;  Laterality: Right;  . BREAST LUMPECTOMY WITH RADIOACTIVE SEED LOCALIZATION Left 03/22/2019   Procedure: LEFT BREAST LUMPECTOMY WITH RADIOACTIVE SEED LOCALIZATION;  Surgeon: Erroll Luna, MD;  Location: [facility];  Service: General;  Laterality: Left;  . CATARACT EXTRACTION    . CHOLECYSTECTOMY N/A 08/13/2017   Procedure: LAPAROSCOPIC CHOLECYSTECTOMY WITH INTRAOPERATIVE CHOLANGIOGRAM;  Surgeon: Excell Seltzer, MD;  [facility];  Service:  General;  Laterality: N/A;  . DILATION AND CURETTAGE OF UTERUS    . ESOPHAGOGASTRODUODENOSCOPY  07/26/2011   Procedure: ESOPHAGOGASTRODUODENOSCOPY (EGD);  Surgeon: Jeryl Columbia, MD;  Location: [facility];  Service: Endoscopy;  Laterality: N/A;  . history of lumbar compression fracture    . left eardum sx  1983  . left knee arthroscopic surgery    . no screening colonoscopy    . s/p bilateral lumpectomies    . SAVORY DILATION  07/26/2011   Procedure: SAVORY DILATION;  Surgeon: Jeryl Columbia, MD;  [facility];  Service: Endoscopy;  Laterality: N/A;  . status post resection squamous cell cancer right lower leg    . TONSILLECTOMY    . UMBILICAL HERNIA REPAIR      reports that she has never smoked. She has never used smokeless tobacco. She reports that she does not drink alcohol or use drugs. Social History   Socioeconomic History  . Marital status: Married    Spouse name: Not on file  . Number of children: Not on file  . Years of education: Not on file  . Highest education level: Not on file  Occupational History  . Not on file  Social Needs  . Financial resource strain: Not on file  . Food insecurity    Worry: Not on file    Inability: Not on file  . Transportation needs    Medical: Not on file    Non-medical: Not on file  Tobacco Use  . Smoking status: Never Smoker  . Smokeless tobacco: Never Used  Substance and Sexual Activity  . Alcohol use: No  . Drug use: No  . Sexual activity: Not on file  Lifestyle  . Physical activity    Days per week: Not on file    Minutes per session: Not on file  . Stress: Not on file  Relationships  . Social Herbalist on phone: Not on file    Gets together: Not on file    Attends religious service: Not on file    Active member of club or organization: Not on file    Attends meetings of clubs or organizations: Not on file    Relationship status: Not on file  . Intimate partner violence    Fear of current or ex  partner: Not on file    Emotionally abused: Not on file    Physically abused: Not on file    Forced sexual activity: Not on file  Other Topics Concern  . Not on file  Social History Narrative  . Not on file    Functional Status Survey:    Family History  Problem Relation Age of Onset  . Cancer Mother 52       breast  . Heart attack Father   . Heart attack Brother   . Heart attack Brother   . Cancer Maternal Aunt 11       breast  . Coronary artery disease  Other   . Cancer Other        ovarian; daughter of mat aunt w/ BC in 12s  . Heart disease Brother   . Cancer Maternal Aunt 60       breast    Health Maintenance  Topic Date Due  . MAMMOGRAM  07/25/2019  . TETANUS/TDAP  05/04/2023  . INFLUENZA VACCINE  Completed  . DEXA SCAN  Completed  . PNA vac Low Risk Adult  Completed    Allergies  Allergen Reactions  . Celecoxib Swelling    Swelling-leg    Outpatient Encounter Medications as of 06/19/2019  Medication Sig  . acetaminophen (TYLENOL) 325 MG tablet Take 2 tablets (650 mg total) by mouth every 6 (six) hours as needed for mild pain (or Fever >/= 101).  Marland Kitchen apixaban (ELIQUIS) 5 MG TABS tablet Take 1 tablet (5 mg total) by mouth 2 (two) times daily.  Marland Kitchen atorvastatin (LIPITOR) 40 MG tablet TAKE 1 TABLET BY MOUTH EVERY DAY  . azithromycin (ZITHROMAX) 250 MG tablet Take 1 tablet (250 mg total) by mouth daily. Take first 2 tablets together, then 1 every day until finished.  . furosemide (LASIX) 20 MG tablet Take 1 tablet (20 mg total) by mouth daily.  Marland Kitchen ipratropium (ATROVENT) 0.06 % nasal spray PLACE 2 SPRAYS INTO BOTH NOSTRILS 4 (FOUR) TIMES DAILY.  . metoprolol succinate (TOPROL-XL) 25 MG 24 hr tablet Take 25 mg by mouth at bedtime.  . metoprolol succinate (TOPROL-XL) 50 MG 24 hr tablet Take 1 tablet (50 mg total) by mouth at bedtime. Take with or immediately following a meal. CALL FOR VIRTUAL VISIT  . montelukast (SINGULAIR) 10 MG tablet TAKE 1 TABLET BY MOUTH EVERYDAY  AT BEDTIME  . Multiple Vitamins-Minerals (PRESERVISION AREDS 2) CAPS Take 1 capsule by mouth 2 (two) times daily.   Vladimir Faster Glycol-Propyl Glycol (SYSTANE OP) Place 1 drop into both eyes 2 (two) times daily as needed (dry eyes).   No facility-administered encounter medications on file as of 06/19/2019.     Review of Systems  Constitutional: Positive for activity change and appetite change.  HENT: Positive for congestion.   Respiratory: Positive for cough.   Cardiovascular: Positive for leg swelling.  Gastrointestinal: Positive for nausea and vomiting.  Genitourinary: Negative.   Musculoskeletal: Negative.   Skin: Negative.   Neurological: Positive for weakness.  Psychiatric/Behavioral: Negative.     Vitals:   06/19/19 1643  BP: 130/80  Pulse: 79  Resp: 18  Temp: 98.8 F (37.1 C)  SpO2: 91%   There is no height or weight on file to calculate BMI. Physical Exam Vitals signs reviewed.  Constitutional:      Appearance: Normal appearance.  HENT:     Head: Normocephalic.     Nose: Nose normal.     Mouth/Throat:     Mouth: Mucous membranes are dry.     Pharynx: Oropharynx is clear.  Eyes:     Pupils: Pupils are equal, round, and reactive to light.  Neck:     Musculoskeletal: Neck supple.  Cardiovascular:     Rate and Rhythm: Normal rate. Rhythm irregular.     Pulses: Normal pulses.  Pulmonary:     Effort: Pulmonary effort is normal.     Comments: Bilateral Wheezing Abdominal:     General: Abdomen is flat. Bowel sounds are normal.     Palpations: Abdomen is soft.  Musculoskeletal:     Comments: Mild swelling Bilateral  Skin:    General: Skin is warm.  Neurological:     General: No focal deficit present.     Mental Status: She is alert and oriented to person, place, and time.  Psychiatric:        Mood and Affect: Mood normal.        Thought Content: Thought content normal.     Labs reviewed: Basic Metabolic Panel: Recent Labs    10/20/18 0956 03/16/19  0926 03/23/19 0301  NA 142 143 138  K 4.3 4.2 4.0  CL 104 106 107  CO2 29 27 23   GLUCOSE 100* 104* 131*  BUN 24* 28* 25*  CREATININE 1.18 1.18* 1.10*  CALCIUM 9.2 9.0 8.2*   Liver Function Tests: Recent Labs    10/06/18 0946 10/18/18 1351 03/16/19 0926  AST 18 20 23   ALT 17 18 21   ALKPHOS 123* 126 105  BILITOT 0.6 0.5 0.6  PROT 6.3 6.7 5.9*  ALBUMIN 4.0 3.6 3.6   No results for input(s): LIPASE, AMYLASE in the last 8760 hours. No results for input(s): AMMONIA in the last 8760 hours. CBC: Recent Labs    10/06/18 0946 10/18/18 1351 03/16/19 0926 03/23/19 0301  WBC 5.3 5.7 5.0 6.9  NEUTROABS 3.8 4.0 3.3  --   HGB 12.6 12.0 12.1 10.8*  HCT 36.8 37.2 37.9 32.7*  MCV 92.1 96.1 96.9 95.1  PLT 194.0 177 175 155   Cardiac Enzymes: No results for input(s): CKTOTAL, CKMB, CKMBINDEX, TROPONINI in the last 8760 hours. BNP: Invalid input(s): POCBNP No results found for: HGBA1C Lab Results  Component Value Date   TSH 4.04 10/06/2018   Lab Results  Component Value Date   VITAMINB12 165 (L) 10/06/2018   Lab Results  Component Value Date   FOLATE 5.8 (L) 08/14/2017   Lab Results  Component Value Date   IRON 16 (L) 08/14/2017   TIBC 266 08/14/2017   FERRITIN 101 08/14/2017    Imaging and Procedures obtained prior to SNF admission: No results found.  Assessment/Plan  Lab test positive for detection of COVID-19 virus with Bronchitis Chest X ray Addendum  Nurses just called patient has Bilateral Infiltrate in her Chest Xray. Her temp is 101 and she is 85 % on RA. D/W the family and will send her out to the ED for further eval  CBC,CMP Ferritin and LDH Start on DexaMethasone 6 mg QD for 10 days Already on Eliquis Vit D 1000 Vit C and Zinc started Monitor POX Oxygen PRN On Zithromycin  Nausea and Vomiting Zofran with Each meal TID Checking CMP  Essential hypertension On Metoprolol Dose should be 25 mg Discontinue 50 mg dose  Diastolic congestive  heart failure with LE edema Will hold Lasix for now  Atrial fibrillation, On Eliquis and Metoprolol    Family/ staff Communication:   Labs/tests ordered:  Total time spent in this patient care encounter was  45_  minutes; greater than 50% of the visit spent counseling patient and staff, reviewing records , Labs and coordinating care for problems addressed at this encounter.

## 2019-06-19 NOTE — H&P (Signed)
History and Physical    [name] [id number] DOB: [birth year] DOA: 06/19/2019  PCP: Isaac Bliss, Rayford Halsted, MD   Patient coming from: [facility]   Chief Complaint: SOB, hypoxia, fever   HPI: [name] is a 85 y.o. female with medical history significant for coronary artery disease, chronic diastolic CHF, paroxysmal atrial fibrillation on Eliquis, and history of breast cancer, now presenting to the emergency department for evaluation of fevers, cough, hypoxia, and recent diagnosis of COVID-19.  Patient was diagnosed with COVID-19 on 06/16/2019, was having some loose stools, cough, fevers, and general malaise.  She was transferred from her independent living facility to SNF, noted to be febrile with oxygen saturations in the mid 80s at rest, and bilateral opacities on chest x-ray, prompting transfer to the emergency department.  She denies any recent chest pain and denies abdominal pain, but reports ongoing malaise, chills, and dyspnea.  ED Course: Upon arrival to the ED, patient is found to be afebrile, saturating mid 80s on room air, tachypneic to 33, and with stable blood pressure.  EKG features a sinus rhythm with LVH.  Chest x-ray is concerning for patchy bilateral airspace disease.  Chemistry panel is notable for potassium 3.0, albumin 2.9, AST 80, and ALT 68.  CBC is notable for mild normocytic anemia.  Lactic acid is reassuringly normal.  Blood cultures were collected and the patient was treated with supplemental oxygen and Decadron in the emergency department.  Review of Systems:  All other systems reviewed and apart from HPI, are negative.  Past Medical History:  Diagnosis Date  . Allergic rhinitis, seasonal   . Breast cancer of upper-outer quadrant of right female breast ([facility]) 09/13/2014  . Chronic venous insufficiency   . Dermatophytosis of nail   . Dysrhythmia    H/o Atrial fibrillation  . Edema leg   . Family history of malignant neoplasm of breast    . Family history of malignant neoplasm of ovary   . Goiter    right thyroid nodule   . HTN (hypertension) 08/13/2017  . HX: breast cancer    bilateral  . Menopausal syndrome   . Osteoarthritis   . Osteoporosis   . Squamous cell skin cancer    right lower leg  . Wears glasses   . Wears hearing aid    both ears    Past Surgical History:  Procedure Laterality Date  . bilateral lumpectomies for breast cancer  Aug. 2007  . BREAST LUMPECTOMY WITH RADIOACTIVE SEED LOCALIZATION Right 10/01/2014   Procedure: BREAST LUMPECTOMY WITH RADIOACTIVE SEED LOCALIZATION;  Surgeon: Erroll Luna, MD;  Location: [facility];  Service: General;  Laterality: Right;  . BREAST LUMPECTOMY WITH RADIOACTIVE SEED LOCALIZATION Left 03/22/2019   Procedure: LEFT BREAST LUMPECTOMY WITH RADIOACTIVE SEED LOCALIZATION;  Surgeon: Erroll Luna, MD;  Location: [facility];  Service: General;  Laterality: Left;  . CATARACT EXTRACTION    . CHOLECYSTECTOMY N/A 08/13/2017   Procedure: LAPAROSCOPIC CHOLECYSTECTOMY WITH INTRAOPERATIVE CHOLANGIOGRAM;  Surgeon: Excell Seltzer, MD;  [facility];  Service: General;  Laterality: N/A;  . DILATION AND CURETTAGE OF UTERUS    . ESOPHAGOGASTRODUODENOSCOPY  07/26/2011   Procedure: ESOPHAGOGASTRODUODENOSCOPY (EGD);  Surgeon: Jeryl Columbia, MD;  Location: [facility];  Service: Endoscopy;  Laterality: N/A;  . history of lumbar compression fracture    . left eardum sx  1983  . left knee arthroscopic surgery    . no screening colonoscopy    . s/p bilateral lumpectomies    .  SAVORY DILATION  07/26/2011   Procedure: SAVORY DILATION;  Surgeon: Jeryl Columbia, MD;  [facility];  Service: Endoscopy;  Laterality: N/A;  . status post resection squamous cell cancer right lower leg    . TONSILLECTOMY    . UMBILICAL HERNIA REPAIR       reports that she has never smoked. She has never used smokeless tobacco. She reports that she does not drink alcohol or use drugs.   Allergies  Allergen Reactions  . Celecoxib Swelling    Swelling-leg    Family History  Problem Relation Age of Onset  . Cancer Mother 69       breast  . Heart attack Father   . Heart attack Brother   . Heart attack Brother   . Cancer Maternal Aunt 55       breast  . Coronary artery disease Other   . Cancer Other        ovarian; daughter of mat aunt w/ BC in 23s  . Heart disease Brother   . Cancer Maternal Aunt 60       breast     Prior to Admission medications   Medication Sig Start Date End Date Taking? Authorizing Provider  acetaminophen (TYLENOL) 325 MG tablet Take 2 tablets (650 mg total) by mouth every 6 (six) hours as needed for mild pain (or Fever >/= 101). 03/23/19  Yes Cornett, [name], MD  apixaban (ELIQUIS) 5 MG TABS tablet Take 1 tablet (5 mg total) by mouth 2 (two) times daily. 12/02/17  Yes Croitoru, Mihai, MD  atorvastatin (LIPITOR) 40 MG tablet TAKE 1 TABLET BY MOUTH EVERY DAY Patient taking differently: Take 40 mg by mouth daily.  05/25/19  Yes Croitoru, Mihai, MD  azithromycin (ZITHROMAX) 250 MG tablet Take 1 tablet (250 mg total) by mouth daily. Take first 2 tablets together, then 1 every day until finished. 06/16/19  Yes Wieters, Hallie C, PA-C  ipratropium (ATROVENT) 0.06 % nasal spray PLACE 2 SPRAYS INTO BOTH NOSTRILS 4 (FOUR) TIMES DAILY. 06/08/19  Yes Kozlow, Donnamarie Poag, MD  metoprolol succinate (TOPROL-XL) 25 MG 24 hr tablet Take 25 mg by mouth at bedtime.   Yes [provider]  montelukast (SINGULAIR) 10 MG tablet TAKE 1 TABLET BY MOUTH EVERYDAY AT BEDTIME Patient taking differently: Take 10 mg by mouth at bedtime.  06/04/19  Yes Kozlow, Donnamarie Poag, MD  Multiple Vitamins-Minerals (PRESERVISION AREDS 2) CAPS Take 1 capsule by mouth 2 (two) times daily.    Yes [provider]  Polyethyl Glycol-Propyl Glycol (SYSTANE OP) Place 1 drop into both eyes 2 (two) times daily as needed (dry eyes).   Yes [provider]  furosemide (LASIX) 20 MG  tablet Take 1 tablet (20 mg total) by mouth daily. Patient not taking: Reported on 06/19/2019 12/05/18   Sanda Klein, MD    Physical Exam: Vitals:   06/19/19 2119 06/19/19 2137 06/19/19 2230 06/19/19 2330  BP:  (!) 136/103 (!) 141/53 (!) 164/57  Pulse:  73    Resp:  (!) 33 (!) 32 (!) 38  Temp:  100.2 F (37.9 C)    TempSrc:  Oral    SpO2: 94% 97%      Constitutional: NAD, calm  Eyes: PERTLA, lids and conjunctivae normal ENMT: Mucous membranes are moist. Posterior pharynx clear of any exudate or lesions.   Neck: normal, supple, no masses, no thyromegaly Respiratory: Tachypneic while at rest.  no wheezing, no crackles. No pallor or cyanosis.  Cardiovascular: S1 & S2  heard, regular rate and rhythm. Mild lower leg swelling bilaterally.   Abdomen: No distension, no tenderness, soft. Bowel sounds active.  Musculoskeletal: no clubbing / cyanosis. No joint deformity upper and lower extremities.     Skin: no significant rashes, lesions, ulcers. Warm, dry, well-perfused. Neurologic: No facial asymmetry. Gross hearing deficit. Sensation intact. Moving all extremities.  Psychiatric: Alert and oriented to person, place, and situation. Pleasant, cooperative.      Labs on Admission: I have personally reviewed following labs and imaging studies  CBC: Recent Labs  Lab 06/19/19 2155  WBC 6.0  NEUTROABS 5.2  HGB 11.4*  HCT 35.3*  MCV 93.6  PLT 123456   Basic Metabolic Panel: Recent Labs  Lab 06/19/19 2155  NA 138  K 3.0*  CL 102  CO2 22  GLUCOSE 148*  BUN 16  CREATININE 0.98  CALCIUM 8.0*   GFR: CrCl cannot be calculated (Unknown ideal weight.). Liver Function Tests: Recent Labs  Lab 06/19/19 2155  AST 80*  ALT 68*  ALKPHOS 91  BILITOT 1.1  PROT 6.2*  ALBUMIN 2.9*   No results for input(s): LIPASE, AMYLASE in the last 168 hours. No results for input(s): AMMONIA in the last 168 hours. Coagulation Profile: No results for input(s): INR, PROTIME in the last 168 hours.  Cardiac Enzymes: No results for input(s): CKTOTAL, CKMB, CKMBINDEX, TROPONINI in the last 168 hours. BNP (last 3 results) No results for input(s): PROBNP in the last 8760 hours. HbA1C: No results for input(s): HGBA1C in the last 72 hours. CBG: No results for input(s): GLUCAP in the last 168 hours. Lipid Profile: No results for input(s): CHOL, HDL, LDLCALC, TRIG, CHOLHDL, LDLDIRECT in the last 72 hours. Thyroid Function Tests: No results for input(s): TSH, T4TOTAL, FREET4, T3FREE, THYROIDAB in the last 72 hours. Anemia Panel: Recent Labs    06/19/19 2155  FERRITIN 219   Urine analysis:    Component Value Date/Time   COLORURINE YELLOW 08/12/2017 [place] 08/12/2017 0627   LABSPEC 1.014 08/12/2017 0627   PHURINE 6.0 08/12/2017 0627   GLUCOSEU NEGATIVE 08/12/2017 0627   HGBUR SMALL (A) 08/12/2017 0627   HGBUR trace-intact 10/28/2009 0905   BILIRUBINUR NEGATIVE 08/12/2017 0627   KETONESUR NEGATIVE 08/12/2017 0627   PROTEINUR NEGATIVE 08/12/2017 0627   UROBILINOGEN 0.2 10/28/2009 0905   NITRITE NEGATIVE 08/12/2017 0627   LEUKOCYTESUR TRACE (A) 08/12/2017 0627   Sepsis Labs: @LABRCNTIP (procalcitonin:4,lacticidven:4) )No results found for this or any previous visit (from the past 240 hour(s)).   Radiological Exams on Admission: Dg Chest Port 1 View  Result Date: 06/19/2019 CLINICAL DATA:  COVID, cough EXAM: PORTABLE CHEST 1 VIEW COMPARISON:  08/29/2017 FINDINGS: Patchy bilateral airspace opacities are noted. Heart is borderline in size. No visible significant effusions or acute bony abnormality. IMPRESSION: Patchy bilateral airspace disease concerning for pneumonia. Electronically Signed   By: Rolm Baptise M.D.   On: 06/19/2019 22:08    EKG: Independently reviewed. Sinus rhythm, LVH.   Assessment/Plan   1. COVID-19 pneumonia  - Diagnosed with COVID-19 on 11/28 and now presenting with progressive SOB, patchy bilateral airspace opacities on CXR, and new 2 Lpm  supplemental O2 requirement  - Blood cultures were collected in ED and she was started on supplemental O2 and Decadron  - Continue supplemental O2 as needed, continue Decadron, start remdesivir, trend inflammatory markers, continue supportive care and isolation     2. Paroxysmal atrial fibrillation  - In sinus rhythm on admission  - CHADS-VASc is 25 (age  x2, CAD, CHF, gender)  - Continue Eliquis and metoprolol     3. Chronic diastolic CHF  - Appears compensated  - Recently stopped Lasix; continue beta-blocker as tolerated    4. Hypokalemia  - Serum potassium is 3.0 in ED  - Replace, repeat chem panel in am   5. CAD  - No anginal complaints  - Continue Lipitor     DVT prophylaxis: Eliquis  Code Status: Full, this was discussed with patient and confirmed  Family Communication: Discussed with patient  Consults called: None  Admission status: Inpatient     Vianne Bulls, MD [facility] Pager [phone]  If 7PM-7AM, please contact night-coverage [email or web address] Password [facility]  06/19/2019, 11:48 PM

## 2019-06-19 NOTE — Telephone Encounter (Signed)
Copied from [facility] [id number]. Topic: Quick Communication - See Telephone Encounter >> Jun 19, 2019  9:42 AM [name] wrote: CRM for notification. See Telephone encounter for: 06/19/19. Pls  have Dr H nurse call [name] about a chest X-ray before transporting to [facility], she tested positive for Covid on Saturday. In process of moving her and thought it would be easier to do enroute, call [phone].   Spoke with [name]. This was done at facility she was transported too.

## 2019-06-19 NOTE — ED Triage Notes (Signed)
[facility] sent to ED for low O2 sat 88% on  Room air  ECF sent pt for eval d/t CXR showed pneumonia.

## 2019-06-19 NOTE — ED Provider Notes (Signed)
[facility] DEPT Provider Note   CSN: [id number] Arrival date & time: 06/19/19  2100     History   Chief Complaint Chief Complaint  Patient presents with  . Weakness  . Pneumonia    HPI [name] is a 85 y.o. female.     HPI Patient presents to the ED for evaluation of worsening symptoms associated with COVID-19.  Patient started having symptoms about a week ago.  She had some loose stools and fatigue.  She had an episode of nausea and vomiting.  Patient started develop some congestion and cough.  Patient went to an urgent care on November 28.  Patient tested positive for COVID-19.  Over the last few days the patient symptoms have progressed.  She has been coughing.  She has had fevers.  She started to have increasing shortness of breath.  She was evaluated at the nursing home today and they were concerned about pneumonia And recommended she come to the ED for evaluation. Past Medical History:  Diagnosis Date  . Allergic rhinitis, seasonal   . Breast cancer of upper-outer quadrant of right female breast ([place]) 09/13/2014  . Chronic venous insufficiency   . Dermatophytosis of nail   . Dysrhythmia    H/o Atrial fibrillation  . Edema leg   . Family history of malignant neoplasm of breast   . Family history of malignant neoplasm of ovary   . Goiter    right thyroid nodule   . HTN (hypertension) 08/13/2017  . HX: breast cancer    bilateral  . Menopausal syndrome   . Osteoarthritis   . Osteoporosis   . Squamous cell skin cancer    right lower leg  . Wears glasses   . Wears hearing aid    both ears    Patient Active Problem List   Diagnosis Date Noted  . Breast cancer, stage 1, left ([place]) 03/22/2019  . Hypercholesterolemia 12/02/2017  . Coronary artery disease involving native coronary artery of native heart without angina pectoris 12/02/2017  . Chronic diastolic heart failure ([place]) 12/02/2017  . Aortic atherosclerosis ([place]) 10/19/2017   . Paroxysmal atrial fibrillation ([place]) 10/04/2017  . Non-ST elevated myocardial infarction ([place]) 08/26/2017  . Near syncope 08/25/2017  . Atrial fibrillation with RVR ([place]) 08/25/2017  . Elevated troponin 08/25/2017  . Grade II diastolic dysfunction 123456  . Acute on chronic cholecystitis 08/13/2017  . Essential hypertension 08/13/2017  . Anemia 08/13/2017  . GERD (gastroesophageal reflux disease) 08/13/2017  . Acute cholecystitis 08/12/2017  . Genetic testing 10/10/2014  . Family history of malignant neoplasm of breast   . Family history of malignant neoplasm of ovary   . Breast cancer of upper-outer quadrant of right female breast ([place]) 09/13/2014  . History of esophageal stricture 07/25/2014  . Dermatophytosis of nail   . RHUS DERMATITIS 05/27/2010  . Osteoporosis 04/14/2010  . VAGINITIS, ATROPHIC 10/28/2009  . DYSURIA 10/28/2009  . GOITER, MULTINODULAR 03/05/2009  . THYROID NODULE, RIGHT 02/05/2009  . Venous (peripheral) insufficiency 02/05/2009  . SENILE OSTEOPOROSIS 01/24/2008  . URI 10/18/2007  . CUTANEOUS ERUPTIONS, DRUG-INDUCED 02/09/2007  . BREAST CANCER, HX OF 02/09/2007  . Allergic rhinitis 02/02/2007  . Osteoarthritis 02/02/2007    Past Surgical History:  Procedure Laterality Date  . bilateral lumpectomies for breast cancer  Aug. 2007  . BREAST LUMPECTOMY WITH RADIOACTIVE SEED LOCALIZATION Right 10/01/2014   Procedure: BREAST LUMPECTOMY WITH RADIOACTIVE SEED LOCALIZATION;  Surgeon: Erroll Luna, MD;  Location: [facility];  Service: General;  Laterality: Right;  . BREAST LUMPECTOMY WITH RADIOACTIVE SEED LOCALIZATION Left 03/22/2019   Procedure: LEFT BREAST LUMPECTOMY WITH RADIOACTIVE SEED LOCALIZATION;  Surgeon: Erroll Luna, MD;  Location: [facility];  Service: General;  Laterality: Left;  . CATARACT EXTRACTION    . CHOLECYSTECTOMY N/A 08/13/2017   Procedure: LAPAROSCOPIC CHOLECYSTECTOMY WITH INTRAOPERATIVE CHOLANGIOGRAM;  Surgeon: Excell Seltzer, MD;  Location: [facility];  Service: General;  Laterality: N/A;  . DILATION AND CURETTAGE OF UTERUS    . ESOPHAGOGASTRODUODENOSCOPY  07/26/2011   Procedure: ESOPHAGOGASTRODUODENOSCOPY (EGD);  Surgeon: Jeryl Columbia, MD;  Location: [facility];  Service: Endoscopy;  Laterality: N/A;  . history of lumbar compression fracture    . left eardum sx  1983  . left knee arthroscopic surgery    . no screening colonoscopy    . s/p bilateral lumpectomies    . SAVORY DILATION  07/26/2011   Procedure: SAVORY DILATION;  Surgeon: Jeryl Columbia, MD;  Location: [facility];  Service: Endoscopy;  Laterality: N/A;  . status post resection squamous cell cancer right lower leg    . TONSILLECTOMY    . UMBILICAL HERNIA REPAIR       OB History   No obstetric history on file.      Home Medications    Prior to Admission medications   Medication Sig Start Date End Date Taking? Authorizing Provider  acetaminophen (TYLENOL) 325 MG tablet Take 2 tablets (650 mg total) by mouth every 6 (six) hours as needed for mild pain (or Fever >/= 101). 03/23/19  Yes Cornett, [name], MD  apixaban (ELIQUIS) 5 MG TABS tablet Take 1 tablet (5 mg total) by mouth 2 (two) times daily. 12/02/17  Yes Croitoru, Mihai, MD  atorvastatin (LIPITOR) 40 MG tablet TAKE 1 TABLET BY MOUTH EVERY DAY Patient taking differently: Take 40 mg by mouth daily.  05/25/19  Yes Croitoru, Mihai, MD  azithromycin (ZITHROMAX) 250 MG tablet Take 1 tablet (250 mg total) by mouth daily. Take first 2 tablets together, then 1 every day until finished. 06/16/19  Yes Wieters, Hallie C, PA-C  ipratropium (ATROVENT) 0.06 % nasal spray PLACE 2 SPRAYS INTO BOTH NOSTRILS 4 (FOUR) TIMES DAILY. 06/08/19  Yes Kozlow, Donnamarie Poag, MD  metoprolol succinate (TOPROL-XL) 25 MG 24 hr tablet Take 25 mg by mouth at bedtime.   Yes [provider]  montelukast (SINGULAIR) 10 MG tablet TAKE 1 TABLET BY MOUTH EVERYDAY AT BEDTIME Patient taking differently: Take 10 mg by mouth at  bedtime.  06/04/19  Yes Kozlow, Donnamarie Poag, MD  Multiple Vitamins-Minerals (PRESERVISION AREDS 2) CAPS Take 1 capsule by mouth 2 (two) times daily.    Yes [provider]  Polyethyl Glycol-Propyl Glycol (SYSTANE OP) Place 1 drop into both eyes 2 (two) times daily as needed (dry eyes).   Yes [provider]  furosemide (LASIX) 20 MG tablet Take 1 tablet (20 mg total) by mouth daily. Patient not taking: Reported on 06/19/2019 12/05/18   Croitoru, Dani Hiers, MD    Family History Family History  Problem Relation Age of Onset  . Cancer Mother 23       breast  . Heart attack Father   . Heart attack Brother   . Heart attack Brother   . Cancer Maternal Aunt 55       breast  . Coronary artery disease Other   . Cancer Other        ovarian; daughter of mat aunt w/ BC in 87s  . Heart disease  Brother   . Cancer Maternal Aunt 36       breast    Social History Social History   Tobacco Use  . Smoking status: Never Smoker  . Smokeless tobacco: Never Used  Substance Use Topics  . Alcohol use: No  . Drug use: No     Allergies   Celecoxib   Review of Systems Review of Systems  All other systems reviewed and are negative.    Physical Exam Updated Vital Signs BP (!) 136/103 (BP Location: Right Arm)   Pulse 73   Temp 100.2 F (37.9 C) (Oral)   Resp (!) 33   SpO2 97%   Physical Exam Vitals signs and nursing note reviewed.  Constitutional:      Appearance: She is well-developed. She is not toxic-appearing or diaphoretic.  HENT:     Head: Normocephalic and atraumatic.     Right Ear: External ear normal.     Left Ear: External ear normal.  Eyes:     General: No scleral icterus.       Right eye: No discharge.        Left eye: No discharge.     Conjunctiva/sclera: Conjunctivae normal.  Neck:     Musculoskeletal: Neck supple.     Trachea: No tracheal deviation.  Cardiovascular:     Rate and Rhythm: Normal rate and regular rhythm.  Pulmonary:     Effort: Pulmonary  effort is normal. No respiratory distress.     Breath sounds: No stridor. Wheezing and rales present.  Abdominal:     General: Bowel sounds are normal. There is no distension.     Palpations: Abdomen is soft.     Tenderness: There is no abdominal tenderness. There is no guarding or rebound.  Musculoskeletal:        General: No tenderness.  Skin:    General: Skin is warm and dry.     Findings: No rash.  Neurological:     Mental Status: She is alert.     Cranial Nerves: No cranial nerve deficit (no facial droop, extraocular movements intact, no slurred speech).     Sensory: No sensory deficit.     Motor: No abnormal muscle tone or seizure activity.     Coordination: Coordination normal.      ED Treatments / Results  Labs (all labs ordered are listed, but only abnormal results are displayed) Labs Reviewed  CBC WITH DIFFERENTIAL/PLATELET - Abnormal; Notable for the following components:      Result Value   RBC 3.77 (*)    Hemoglobin 11.4 (*)    HCT 35.3 (*)    Lymphs Abs 0.4 (*)    Abs Immature Granulocytes 0.08 (*)    All other components within normal limits  COMPREHENSIVE METABOLIC PANEL - Abnormal; Notable for the following components:   Potassium 3.0 (*)    Glucose, Bld 148 (*)    Calcium 8.0 (*)    Total Protein 6.2 (*)    Albumin 2.9 (*)    AST 80 (*)    ALT 68 (*)    GFR calc non Af Amer 49 (*)    GFR calc Af Amer 57 (*)    All other components within normal limits  D-DIMER, QUANTITATIVE (NOT AT [facility]) - Abnormal; Notable for the following components:   D-Dimer, Quant 1.33 (*)    All other components within normal limits  LACTATE DEHYDROGENASE - Abnormal; Notable for the following components:   LDH 270 (*)  All other components within normal limits  FIBRINOGEN - Abnormal; Notable for the following components:   Fibrinogen 708 (*)    All other components within normal limits  CULTURE, BLOOD (ROUTINE X 2)  CULTURE, BLOOD (ROUTINE X 2)  LACTIC ACID, PLASMA   LACTIC ACID, PLASMA  PROCALCITONIN  FERRITIN  TRIGLYCERIDES  C-REACTIVE PROTEIN    EKG EKG Interpretation  Date/Time:  Tuesday June 19 2019 21:43:00 EST Ventricular Rate:  82 PR Interval:    QRS Duration: 107 QT Interval:  396 QTC Calculation: 463 R Axis:   -32 Text Interpretation: Sinus rhythm Left ventricular hypertrophy Anterior Q waves, possibly due to LVH Baseline wander in lead(s) I aVL Compared to previous tracing atrial fibrillation has resolved Confirmed by Dorie Rank [phone]) on 06/19/2019 9:49:08 PM   Radiology Dg Chest Port 1 View  Result Date: 06/19/2019 CLINICAL DATA:  COVID, cough EXAM: PORTABLE CHEST 1 VIEW COMPARISON:  08/29/2017 FINDINGS: Patchy bilateral airspace opacities are noted. Heart is borderline in size. No visible significant effusions or acute bony abnormality. IMPRESSION: Patchy bilateral airspace disease concerning for pneumonia. Electronically Signed   By: Rolm Baptise M.D.   On: 06/19/2019 22:08    Procedures Procedures (including critical care time)  Medications Ordered in ED Medications  dexamethasone (DECADRON) injection 6 mg (has no administration in time range)     Initial Impression / Assessment and Plan / ED Course  I have reviewed the triage vital signs and the nursing notes.  Pertinent labs & imaging results that were available during my care of the patient were reviewed by me and considered in my medical decision making (see chart for details).   Patient's laboratory tests are notable for elevated inflammatory markers consistent with her known COVID-19 illness.  Patient's chest x-ray does show evidence of bilateral patchy airspace disease.  This is consistent with Covid pneumonia.  Patient does have an oxygen requirement.  She is currently on nasal cannula oxygen.  Patient was started on IV Decadron.  Concerning her worsening symptoms I will consult the medical service for admission and further treatment.  Final Clinical  Impressions(s) / ED Diagnoses   Final diagnoses:  Pneumonia due to COVID-19 virus        Dorie Rank, MD 06/19/19 2309

## 2019-06-20 ENCOUNTER — Encounter (HOSPITAL_COMMUNITY): Payer: Self-pay | Admitting: *Deleted

## 2019-06-20 LAB — ABO/RH: ABO/RH(D): A POS

## 2019-06-20 LAB — COMPREHENSIVE METABOLIC PANEL
ALT: 74 U/L — ABNORMAL HIGH (ref 0–44)
AST: 84 U/L — ABNORMAL HIGH (ref 15–41)
Albumin: 2.6 g/dL — ABNORMAL LOW (ref 3.5–5.0)
Alkaline Phosphatase: 91 U/L (ref 38–126)
Anion gap: 12 (ref 5–15)
BUN: 16 mg/dL (ref 8–23)
CO2: 20 mmol/L — ABNORMAL LOW (ref 22–32)
Calcium: 7.8 mg/dL — ABNORMAL LOW (ref 8.9–10.3)
Chloride: 107 mmol/L (ref 98–111)
Creatinine, Ser: 0.88 mg/dL (ref 0.44–1.00)
GFR calc Af Amer: >60 mL/min (ref >60)
GFR calc non Af Amer: 56 mL/min — ABNORMAL LOW (ref >60)
Glucose, Bld: 173 mg/dL — ABNORMAL HIGH (ref 70–99)
Potassium: 3.8 mmol/L (ref 3.5–5.1)
Sodium: 139 mmol/L (ref 135–145)
Total Bilirubin: 1.1 mg/dL (ref 0.3–1.2)
Total Protein: 5.9 g/dL — ABNORMAL LOW (ref 6.5–8.1)

## 2019-06-20 LAB — CBC WITH DIFFERENTIAL/PLATELET
Abs Immature Granulocytes: 0.06 10*3/uL (ref 0.00–0.07)
Basophils Absolute: 0 10*3/uL (ref 0.0–0.1)
Basophils Relative: 0 %
Eosinophils Absolute: 0 10*3/uL (ref 0.0–0.5)
Eosinophils Relative: 0 %
HCT: 35.3 % — ABNORMAL LOW (ref 36.0–46.0)
Hemoglobin: 11.1 g/dL — ABNORMAL LOW (ref 12.0–15.0)
Immature Granulocytes: 1 %
Lymphocytes Relative: 4 %
Lymphs Abs: 0.2 10*3/uL — ABNORMAL LOW (ref 0.7–4.0)
MCH: 30 pg (ref 26.0–34.0)
MCHC: 31.4 g/dL (ref 30.0–36.0)
MCV: 95.4 fL (ref 80.0–100.0)
Monocytes Absolute: 0.2 10*3/uL (ref 0.1–1.0)
Monocytes Relative: 3 %
Neutro Abs: 5.1 10*3/uL (ref 1.7–7.7)
Neutrophils Relative %: 92 %
Platelets: 151 10*3/uL (ref 150–400)
RBC: 3.7 MIL/uL — ABNORMAL LOW (ref 3.87–5.11)
RDW: 13.2 % (ref 11.5–15.5)
WBC: 5.5 10*3/uL (ref 4.0–10.5)
nRBC: 0 % (ref 0.0–0.2)

## 2019-06-20 LAB — D-DIMER, QUANTITATIVE: D-Dimer, Quant: 1.15 ug/mL-FEU — ABNORMAL HIGH (ref 0.00–0.50)

## 2019-06-20 LAB — MAGNESIUM: Magnesium: 1.9 mg/dL (ref 1.7–2.4)

## 2019-06-20 LAB — PHOSPHORUS: Phosphorus: 2.6 mg/dL (ref 2.5–4.6)

## 2019-06-20 LAB — C-REACTIVE PROTEIN: CRP: 19.3 mg/dL — ABNORMAL HIGH (ref <1.0)

## 2019-06-20 MED ORDER — ORAL CARE MOUTH RINSE
15.0000 mL | Freq: Two times a day (BID) | OROMUCOSAL | Status: DC
  Administered 2019-06-20 – 2019-06-27 (×14): 15 mL via OROMUCOSAL

## 2019-06-20 MED ORDER — DEXAMETHASONE SODIUM PHOSPHATE 10 MG/ML IJ SOLN
6.0000 mg | INTRAMUSCULAR | Status: DC
  Administered 2019-06-20 – 2019-06-26 (×7): 6 mg via INTRAVENOUS
  Filled 2019-06-20 (×7): qty 1

## 2019-06-20 MED ORDER — SODIUM CHLORIDE 0.9 % IV SOLN
100.0000 mg | Freq: Once | INTRAVENOUS | Status: AC
  Administered 2019-06-20: 100 mg via INTRAVENOUS
  Filled 2019-06-20: qty 20

## 2019-06-20 MED ORDER — MONTELUKAST SODIUM 10 MG PO TABS
10.0000 mg | ORAL_TABLET | Freq: Every day | ORAL | Status: DC
  Administered 2019-06-20 – 2019-06-26 (×8): 10 mg via ORAL
  Filled 2019-06-20 (×8): qty 1

## 2019-06-20 MED ORDER — ATORVASTATIN CALCIUM 40 MG PO TABS
40.0000 mg | ORAL_TABLET | Freq: Every day | ORAL | Status: DC
  Administered 2019-06-20 – 2019-06-27 (×8): 40 mg via ORAL
  Filled 2019-06-20 (×8): qty 1

## 2019-06-20 MED ORDER — SODIUM CHLORIDE 0.9 % IV SOLN
100.0000 mg | Freq: Every day | INTRAVENOUS | Status: DC
  Administered 2019-06-21 – 2019-06-22 (×2): 100 mg via INTRAVENOUS
  Filled 2019-06-20 (×3): qty 20

## 2019-06-20 MED ORDER — SODIUM CHLORIDE 0.9 % IV SOLN
200.0000 mg | Freq: Once | INTRAVENOUS | Status: AC
  Administered 2019-06-20: 200 mg via INTRAVENOUS
  Filled 2019-06-20: qty 40

## 2019-06-20 MED ORDER — SODIUM CHLORIDE 0.9 % IV SOLN: 100.0000 mg | INTRAVENOUS | Status: DC

## 2019-06-20 MED ORDER — METOPROLOL SUCCINATE ER 25 MG PO TB24
25.0000 mg | ORAL_TABLET | Freq: Every day | ORAL | Status: DC
  Administered 2019-06-20 – 2019-06-26 (×7): 25 mg via ORAL
  Filled 2019-06-20 (×8): qty 1

## 2019-06-20 MED ORDER — GUAIFENESIN-DM 100-10 MG/5ML PO SYRP
5.0000 mL | ORAL_SOLUTION | ORAL | Status: DC | PRN
  Administered 2019-06-20 – 2019-06-25 (×7): 5 mL via ORAL
  Filled 2019-06-20 (×7): qty 10

## 2019-06-20 NOTE — Plan of Care (Signed)

## 2019-06-20 NOTE — Plan of Care (Signed)

## 2019-06-20 NOTE — Progress Notes (Signed)
Spoke with pt husband Verdell Face [phone] to provide update on the plan of care for patient. SRP, RN

## 2019-06-20 NOTE — Progress Notes (Signed)
Pharmacy: Remdesivir   Patient is a 85 y.o. female with COVID.  Pharmacy has been consulted for remdesivir dosing.   -CXR shows "Patchy bilateral airspace disease concerning for pneumonia."  -Pt requiring supplemental oxygen (No--99%RA)  -ALT 68     A/P:  - Patient meets criteria for remdesivir. Will initiate remdesivir 200 mg once followed by 100 mg daily x 4 days.  - Daily CMET while on remdesivir - Will f/u pt's ALT and clinical condition

## 2019-06-20 NOTE — Progress Notes (Signed)
PROGRESS NOTE    [name]  [id number] DOB: February 16, 1922 DOA: 06/19/2019 PCP: Erline Hau, MD   Brief Narrative: 85 y.o.  female with CAD, chronic diastolic CHF, PAF on Eliquis, history of breast cancer recent diagnosis of COVID-19 on 11/28 at urgent care center and discharged back to her apartment where she lives with her husband who is also Covid positive.  Patient continued nausea vomiting, and husband not able to take care of her and was admitted to a skilled nursing facility for further monitoring, was in azithromycin. At SNF she was found to have hypoxia in mid 80s chest x-ray with bilateral opacities and sent to the ER.   In OZ:HYQMVHQI, saturating mid 80s on room air, tachypneic to 33, and with stable blood pressure.  EKG features a sinus rhythm with LVH.  Chest x-ray is concerning for patchy bilateral airspace disease.   LABS-albumin 2.9, AST 80, and ALT 68.  CBC is notable for mild normocytic anemia.  Lactic acid NL, Blood cultures sent, placed on Decadron, supplemental oxygen and was admitted .  Rapid Covid is +    Subjective: Hard of hearing, alert awake not in acute distress On 3l [place] at 95%, afebrile overnight Labs w hco3 at 20, lft as ast/alt at 84/74- similar to admission CRP 18.5->19.3, wbc 5.5k.  Assessment & Plan:   Pneumonia due to COVID-19 virus, CRP 18.5->19.3.  X-ray showed patchy bilateral airspace disease, procalcitonin stable at 0.1.  Continue on Decadron,remdesivir, pharmacy dosing supplemental oxygen. CRP 18.5->19.3. ferritin 219 nl.D dimer 1.33->1.15. Continue to monitor inflammatory markers daily.  Blood culture pending from ER.Will transfer to [facility]-   Acute hypoxic respiratory failure due to #1, continue steroid remdesivir supplemental oxygen as above.  Patient not in any acute distress,not tachypneic today and seems to be responding to current therapy and o2 requirement not increasing. On admission was tachypneic up to 33.  Nausea and  vomiting-vomiting for few days and diagnosed with COVID-19.Continue supportive care IV fluid hydration.  Metabolic acidosis with bicarb 20 in the setting of nausea vomiting.Monitor electrolytes  Abnormal LFTs with AST/ALT 84/74 more or less same.Total bili normal and alk phos normal.  Monitor LFTs in the setting of COVID-19 infection.  Paroxysmal atrial fibrillation:In normal sinus rhythm,on Eliquis and metoprolol will be continued  ONG:EXBMWUX chest pain.Continue her Lipitor,Eliquis.  Chronic diastolic heart failure, mild lower extremity edema.  Overall euvolemic.  Lasix was recently stopped. Continue her beta-blocker as tolerated.  Hypokalemia:Repleted.  Body mass index is 28.31 kg/m.   DVT prophylaxis:Eliquis Code Status:Full Family Communication: Plan of care discussed with patient at bedside.I called and updated her spouse POA-agrees with the plan. On his request I also called her daughter- home no [phone] ( cell is- [phone]) also and updated. Disposition Plan: Remains inpatient pending clinical improvement hypoxia, COVID-19 pneumonia.  Transfer to [facility] for further are- family agreeable.  Consultants: none Procedures:none Microbiology: covid 19 +  Antimicrobials: Anti-infectives (From admission, onward)   Start     Dose/Rate Route Frequency Ordered Stop   06/21/19 1000  remdesivir 100 mg in sodium chloride 0.9 % 250 mL IVPB  Status:  Discontinued     100 mg 500 mL/hr over 30 Minutes Intravenous Every 24 hours 06/20/19 0002 06/20/19 0827   06/21/19 1000  remdesivir 100 mg in sodium chloride 0.9 % 250 mL IVPB     100 mg 500 mL/hr over 30 Minutes Intravenous Daily 06/20/19 0829 06/24/19 0959   06/20/19 1600  remdesivir 100 mg  in sodium chloride 0.9 % 250 mL IVPB     100 mg 500 mL/hr over 30 Minutes Intravenous  Once 06/20/19 0829     06/20/19 0015  remdesivir 200 mg in sodium chloride 0.9 % 250 mL IVPB     200 mg 500 mL/hr over 30 Minutes Intravenous Once 06/20/19 0002  06/20/19 0122       Objective: Vitals:   06/20/19 0132 06/20/19 0132 06/20/19 0537 06/20/19 0634  BP:  (!) 162/60 (!) 162/74   Pulse:  88 85   Resp:  20 (!) 28 20  Temp:  99.8 F (37.7 C) 98.2 F (36.8 C)   TempSrc:  Oral Oral   SpO2:  96% 95%   Weight: 72.5 kg     Height: 5' 3" (1.6 m)       Intake/Output Summary (Last 24 hours) at 06/20/2019 1102 Last data filed at 06/20/2019 0600 Gross per 24 hour  Intake 499.16 ml  Output -  Net 499.16 ml   Filed Weights   06/20/19 0132  Weight: 72.5 kg   Weight change:   Body mass index is 28.31 kg/m.  Intake/Output from previous day: 12/01 0701 - 12/02 0700 In: 499.2 [IV Piggyback:499.2] Out: -  Intake/Output this shift: No intake/output data recorded.  Examination:  General exam: Alert,awake, hard of hearing, not in distress, Weak appearing elderly. HEENT:Oral mucosa moist, Ear/Nose WNL grossly, dentition normal. Respiratory system: Bilateral basal crackles, no use of accessory muscle, nontender  Cardiovascular system: S1 & S2 +, No JVD,. Gastrointestinal system: Abdomen soft, NT,ND, BS+ Nervous System:Alert, awake, moving extremities and grossly nonfocal Extremities: mild leg edema, distal peripheral pulses palpable.  Skin: No rashes,no icterus. MSK: Normal muscle bulk,tone, power  Medications:  Scheduled Meds: . apixaban  5 mg Oral BID  . atorvastatin  40 mg Oral q1800  . dexamethasone (DECADRON) injection  6 mg Intravenous Q24H  . mouth rinse  15 mL Mouth Rinse BID  . metoprolol succinate  25 mg Oral QHS  . montelukast  10 mg Oral QHS   Continuous Infusions: . remdesivir 100 mg in NS 250 mL     Followed by  . [START ON 06/21/2019] remdesivir 100 mg in NS 250 mL      Data Reviewed: I have personally reviewed following labs and imaging studies  CBC: Recent Labs  Lab 06/19/19 2155 06/20/19 0440  WBC 6.0 5.5  NEUTROABS 5.2 5.1  HGB 11.4* 11.1*  HCT 35.3* 35.3*  MCV 93.6 95.4  PLT 164 286   Basic  Metabolic Panel: Recent Labs  Lab 06/19/19 2155 06/20/19 0440  NA 138 139  K 3.0* 3.8  CL 102 107  CO2 22 20*  GLUCOSE 148* 173*  BUN 16 16  CREATININE 0.98 0.88  CALCIUM 8.0* 7.8*  MG  --  1.9  PHOS  --  2.6   GFR: Estimated Creatinine Clearance: 36.5 mL/min (by C-G formula based on SCr of 0.88 mg/dL). Liver Function Tests: Recent Labs  Lab 06/19/19 2155 06/20/19 0440  AST 80* 84*  ALT 68* 74*  ALKPHOS 91 91  BILITOT 1.1 1.1  PROT 6.2* 5.9*  ALBUMIN 2.9* 2.6*   No results for input(s): LIPASE, AMYLASE in the last 168 hours. No results for input(s): AMMONIA in the last 168 hours. Coagulation Profile: No results for input(s): INR, PROTIME in the last 168 hours. Cardiac Enzymes: No results for input(s): CKTOTAL, CKMB, CKMBINDEX, TROPONINI in the last 168 hours. BNP (last 3 results) No results for input(s):  PROBNP in the last 8760 hours. HbA1C: No results for input(s): HGBA1C in the last 72 hours. CBG: No results for input(s): GLUCAP in the last 168 hours. Lipid Profile: Recent Labs    06/19/19 2155  TRIG 59   Thyroid Function Tests: No results for input(s): TSH, T4TOTAL, FREET4, T3FREE, THYROIDAB in the last 72 hours. Anemia Panel: Recent Labs    06/19/19 2155  FERRITIN 219   Sepsis Labs: Recent Labs  Lab 06/19/19 2155  PROCALCITON 0.10  LATICACIDVEN 1.1    Recent Results (from the past 240 hour(s))  Blood Culture (routine x 2)     Status: None (Preliminary result)   Collection Time: 06/19/19  9:55 PM   Specimen: BLOOD  Result Value Ref Range Status   Specimen Description   Final    BLOOD LEFT HAND Performed at [facility] [address] [place], [place] [address]    Special Requests   Final    BOTTLES DRAWN AEROBIC AND ANAEROBIC Blood Culture adequate volume Performed at [facility] [address], [place], [place] [address]    Culture   Final    NO GROWTH < 12 HOURS Performed at [facility] [address], [place], [place] [address]    Report Status PENDING  Incomplete  Blood Culture (routine x 2)     Status: None (Preliminary result)   Collection Time: 06/19/19  9:55 PM   Specimen: BLOOD  Result Value Ref Range Status   Specimen Description   Final    BLOOD LEFT ANTECUBITAL Performed at [facility] [address], [place], [place] [address]    Special Requests   Final    BOTTLES DRAWN AEROBIC AND ANAEROBIC Blood Culture adequate volume Performed at [facility] [address] [place], [place] [address]    Culture   Final    NO GROWTH < 12 HOURS Performed at [facility] [address], [place], [place] [address]    Report Status PENDING  Incomplete      Radiology Studies: Dg Chest Port 1 View  Result Date: 06/19/2019 CLINICAL DATA:  COVID, cough EXAM: PORTABLE CHEST 1 VIEW COMPARISON:  08/29/2017 FINDINGS: Patchy bilateral airspace opacities are noted. Heart is borderline in size. No visible significant effusions or acute bony abnormality. IMPRESSION: Patchy bilateral airspace disease concerning for pneumonia. Electronically Signed   By: Rolm Baptise M.D.   On: 06/19/2019 22:08      LOS: 1 day   Time spent: More than 50% of that time was spent in counseling and/or coordination of care.  Antonieta Pert, MD [facility]  06/20/2019, 11:02 AM

## 2019-06-21 ENCOUNTER — Other Ambulatory Visit: Payer: Self-pay | Admitting: *Deleted

## 2019-06-21 ENCOUNTER — Telehealth: Payer: Self-pay | Admitting: Cardiovascular Disease

## 2019-06-21 LAB — COMPREHENSIVE METABOLIC PANEL
ALT: 169 U/L — ABNORMAL HIGH (ref 0–44)
AST: 180 U/L — ABNORMAL HIGH (ref 15–41)
Albumin: 2.6 g/dL — ABNORMAL LOW (ref 3.5–5.0)
Alkaline Phosphatase: 95 U/L (ref 38–126)
Anion gap: 11 (ref 5–15)
BUN: 27 mg/dL — ABNORMAL HIGH (ref 8–23)
CO2: 22 mmol/L (ref 22–32)
Calcium: 8.3 mg/dL — ABNORMAL LOW (ref 8.9–10.3)
Chloride: 106 mmol/L (ref 98–111)
Creatinine, Ser: 0.82 mg/dL (ref 0.44–1.00)
GFR calc Af Amer: >60 mL/min (ref >60)
GFR calc non Af Amer: >60 mL/min (ref >60)
Glucose, Bld: 180 mg/dL — ABNORMAL HIGH (ref 70–99)
Potassium: 3.4 mmol/L — ABNORMAL LOW (ref 3.5–5.1)
Sodium: 139 mmol/L (ref 135–145)
Total Bilirubin: 0.9 mg/dL (ref 0.3–1.2)
Total Protein: 6 g/dL — ABNORMAL LOW (ref 6.5–8.1)

## 2019-06-21 LAB — CBC WITH DIFFERENTIAL/PLATELET
Abs Immature Granulocytes: 0.06 10*3/uL (ref 0.00–0.07)
Basophils Absolute: 0 10*3/uL (ref 0.0–0.1)
Basophils Relative: 0 %
Eosinophils Absolute: 0 10*3/uL (ref 0.0–0.5)
Eosinophils Relative: 0 %
HCT: 36.9 % (ref 36.0–46.0)
Hemoglobin: 11.8 g/dL — ABNORMAL LOW (ref 12.0–15.0)
Immature Granulocytes: 1 %
Lymphocytes Relative: 5 %
Lymphs Abs: 0.4 10*3/uL — ABNORMAL LOW (ref 0.7–4.0)
MCH: 29.9 pg (ref 26.0–34.0)
MCHC: 32 g/dL (ref 30.0–36.0)
MCV: 93.4 fL (ref 80.0–100.0)
Monocytes Absolute: 0.2 10*3/uL (ref 0.1–1.0)
Monocytes Relative: 3 %
Neutro Abs: 6 10*3/uL (ref 1.7–7.7)
Neutrophils Relative %: 91 %
Platelets: 185 10*3/uL (ref 150–400)
RBC: 3.95 MIL/uL (ref 3.87–5.11)
RDW: 13 % (ref 11.5–15.5)
WBC: 6.6 10*3/uL (ref 4.0–10.5)
nRBC: 0 % (ref 0.0–0.2)

## 2019-06-21 LAB — C-REACTIVE PROTEIN: CRP: 17.2 mg/dL — ABNORMAL HIGH (ref <1.0)

## 2019-06-21 LAB — D-DIMER, QUANTITATIVE: D-Dimer, Quant: 1.24 ug/mL-FEU — ABNORMAL HIGH (ref 0.00–0.50)

## 2019-06-21 LAB — PHOSPHORUS: Phosphorus: 2.5 mg/dL (ref 2.5–4.6)

## 2019-06-21 LAB — MAGNESIUM: Magnesium: 2 mg/dL (ref 1.7–2.4)

## 2019-06-21 MED ORDER — POTASSIUM CHLORIDE CRYS ER 20 MEQ PO TBCR
20.0000 meq | EXTENDED_RELEASE_TABLET | Freq: Every day | ORAL | Status: DC
  Administered 2019-06-21 – 2019-06-23 (×3): 20 meq via ORAL
  Filled 2019-06-21 (×3): qty 1

## 2019-06-21 MED ORDER — APIXABAN 5 MG PO TABS: 5.0000 mg | ORAL_TABLET | Freq: Two times a day (BID) | ORAL | 3 refills | Status: DC

## 2019-06-21 NOTE — NC FL2 (Signed)
[place] LEVEL OF CARE SCREENING TOOL     IDENTIFICATION  Patient Name: [name] Birthdate: Apr 17, 1922 Sex: female Admission Date (Current Location): 06/19/2019  [facility] and [place] Number:  Herbalist and Address:  [facility],  [place] [address] [place]      Provider Number: [id number]  Attending Physician Name and Address:  Antonieta Pert, MD  Relative Name and Phone Number:     [place], [place] [phone]       Current Level of Care: Hospital Recommended Level of Care: [place] Prior Approval Number:    Date Approved/Denied:   PASRR Number: [id number]  Discharge Plan: SNF    Current Diagnoses: Patient Active Problem List   Diagnosis Date Noted  . Pneumonia due to COVID-19 virus 06/19/2019  . Hypokalemia 06/19/2019  . Breast cancer, stage 1, left ([place]) 03/22/2019  . Hypercholesterolemia 12/02/2017  . Coronary artery disease involving native heart with angina pectoris ([place]) 12/02/2017  . Chronic diastolic heart failure ([place]) 12/02/2017  . Aortic atherosclerosis ([place]) 10/19/2017  . Paroxysmal atrial fibrillation ([place]) 10/04/2017  . Non-ST elevated myocardial infarction ([place]) 08/26/2017  . Near syncope 08/25/2017  . Atrial fibrillation with RVR ([place]) 08/25/2017  . Elevated troponin 08/25/2017  . Grade II diastolic dysfunction [id number]  . Acute on chronic cholecystitis 08/13/2017  . Essential hypertension 08/13/2017  . Anemia 08/13/2017  . GERD (gastroesophageal reflux disease) 08/13/2017  . Acute cholecystitis 08/12/2017  . Genetic testing 10/10/2014  . Family history of malignant neoplasm of breast   . Family history of malignant neoplasm of ovary   . Breast cancer of upper-outer quadrant of right female breast ([place]) 09/13/2014  . History of esophageal stricture 07/25/2014  . Dermatophytosis of nail   . RHUS DERMATITIS 05/27/2010  . Osteoporosis 04/14/2010  . VAGINITIS, ATROPHIC  10/28/2009  . DYSURIA 10/28/2009  . GOITER, MULTINODULAR 03/05/2009  . THYROID NODULE, RIGHT 02/05/2009  . Venous (peripheral) insufficiency 02/05/2009  . SENILE OSTEOPOROSIS 01/24/2008  . URI 10/18/2007  . CUTANEOUS ERUPTIONS, DRUG-INDUCED 02/09/2007  . BREAST CANCER, HX OF 02/09/2007  . Allergic rhinitis 02/02/2007  . Osteoarthritis 02/02/2007    Orientation RESPIRATION BLADDER Height & Weight     Self, Place, Time, Situation  Normal Continent Weight: 159 lb 13.3 oz (72.5 kg) Height:  5\' 3"  (160 cm)  BEHAVIORAL SYMPTOMS/MOOD NEUROLOGICAL BOWEL NUTRITION STATUS      Continent Diet(Heart Healthy)  AMBULATORY STATUS COMMUNICATION OF NEEDS Skin   Extensive Assist Verbally Normal                       Personal Care Assistance Level of Assistance  Bathing, Dressing, Feeding Bathing Assistance: Maximum assistance Feeding assistance: Independent Dressing Assistance: Maximum assistance     Functional Limitations Info  Sight, Speech, Hearing Sight Info: Impaired Hearing Info: Impaired Speech Info: Adequate    SPECIAL CARE FACTORS FREQUENCY  PT (By licensed PT), OT (By licensed OT)     PT Frequency: 5x/week OT Frequency: 5x/week            Contractures Contractures Info: Not present    Additional Factors Info  Code Status, Allergies, Psychotropic Code Status Info: Fullcode Allergies Info: Allergies: Celecoxib           Current Medications (06/21/2019):  This is the current hospital active medication list Current Facility-Administered Medications  Medication Dose Route Frequency Provider Last Rate Last Dose  . acetaminophen (TYLENOL) tablet 650 mg  650 mg  Oral Q6H PRN Opyd, Ilene Qua, MD      . apixaban (ELIQUIS) tablet 5 mg  5 mg Oral BID Opyd, Ilene Qua, MD   5 mg at 06/21/19 1029  . atorvastatin (LIPITOR) tablet 40 mg  40 mg Oral q1800 Opyd, Ilene Qua, MD   40 mg at 06/21/19 1029  . dexamethasone (DECADRON) injection 6 mg  6 mg Intravenous Q24H Opyd,  Ilene Qua, MD   6 mg at 06/20/19 2204  . guaiFENesin-dextromethorphan (ROBITUSSIN DM) 100-10 MG/5ML syrup 5 mL  5 mL Oral Q4H PRN Opyd, Ilene Qua, MD   5 mL at 06/20/19 2153  . MEDLINE mouth rinse  15 mL Mouth Rinse BID Opyd, Ilene Qua, MD   15 mL at 06/21/19 1039  . metoprolol succinate (TOPROL-XL) 24 hr tablet 25 mg  25 mg Oral QHS Opyd, Ilene Qua, MD   25 mg at 06/20/19 2153  . montelukast (SINGULAIR) tablet 10 mg  10 mg Oral QHS Opyd, Ilene Qua, MD   10 mg at 06/20/19 2153  . ondansetron (ZOFRAN) tablet 4 mg  4 mg Oral Q6H PRN Opyd, Ilene Qua, MD       Or  . ondansetron (ZOFRAN) injection 4 mg  4 mg Intravenous Q6H PRN Opyd, Ilene Qua, MD      . potassium chloride SA (KLOR-CON) CR tablet 20 mEq  20 mEq Oral Daily Kc, Ramesh, MD   20 mEq at 06/21/19 1436  . remdesivir 100 mg in sodium chloride 0.9 % 250 mL IVPB  100 mg Intravenous Daily Lenis Noon, RPH 500 mL/hr at 06/21/19 1100       Discharge Medications: Please see discharge summary for a list of discharge medications.  Relevant Imaging Results:  Relevant Lab Results:   Additional Information SS#: [id number]  Lia Hopping, LCSW

## 2019-06-21 NOTE — Telephone Encounter (Signed)
New Message    [name] is calling and says she dropped paper work off for pt and her Husband  She says both of them are positive for COVID  She says she dropped paper work off for them in regards to [employer] and is calling about the paper work     Please call back

## 2019-06-21 NOTE — Telephone Encounter (Signed)
Returned the call to Jenny Reichmann to let her know that we will fax both forms for Mr. and Mrs. Tritschler. She verbalized her understanding.

## 2019-06-21 NOTE — Plan of Care (Signed)
Continue with current POC

## 2019-06-21 NOTE — Progress Notes (Signed)
PROGRESS NOTE    [name]  [id number] DOB: Sep 11, 1921 DOA: 06/19/2019 PCP: Erline Hau, MD   Brief Narrative: 85 y.o.  female with CAD, chronic diastolic CHF, PAF on Eliquis, history of breast cancer recent diagnosis of COVID-19 on 11/28 at urgent care center and discharged back to her apartment where she lives with her husband who is also Covid positive.  Patient continued nausea vomiting, and husband not able to take care of her and was admitted to a skilled nursing facility for further monitoring, was in azithromycin. At SNF she was found to have hypoxia in mid 80s chest x-ray with bilateral opacities and sent to the ER.   In QZ:RAQTMAUQ, saturating mid 80s on room air, tachypneic to 33, and with stable blood pressure.  EKG features a sinus rhythm with LVH.  Chest x-ray is concerning for patchy bilateral airspace disease.   LABS-albumin 2.9, AST 80, and ALT 68.  CBC is notable for mild normocytic anemia.  Lactic acid NL, Blood cultures sent, placed on Decadron, supplemental oxygen and was admitted .  Rapid Covid is +    Subjective:  Hard of hearing Reports that she did not have good sleep last night and not feeling very well and trying to take a nap this morning. No acute events overnight. Oxygen was weaned off to room air but saturating at 89% placed back on 1 L and currently at 92%  Labs w hco3 at 20->22, lft:ast/alt at 84/74- 180/169, TB 0.9 and gotten worse. CRP 18.5->19.3->17.3 wbc 5.5k->6.6k.  Assessment & Plan:   Pneumonia due to COVID-19 virus: Symptomatically feeling well oxygen requirement decreasing .inflammatory markers mostly stable-CRP 18.5->19.3->17.3,ferritin 219 nl.D dimer 1.33->1.15->1.24. . procalcitonin stable at 0.1. But LFts worsened.continue on Decadron 6 mg, remdesivir pharmacy dosing continue to monitor inflammatory markers daily.  Blood culture  NGTD. Transfer to [facility] on hold  As o2 requirement <4l/min per bed meeting today  Acute  hypoxic respiratory failure due to #1: Improving, on 1l [place] today-continue supplemental oxygen and wean O2 as tolerated continue treatment plan as above.On admission was tachypneic up to 33 and needed o2 up to 3l/min .  Nausea and vomiting-vomiting for few days and diagnosed with COVID-19.Continue supportive care IV fluid hydration. Tolerating po.  Metabolic acidosis: Improving encourage oral intake.    Abnormal LFTs with AST/ALT 84/74 -->worsened to 180/169, TB 0.9. ALP normal.  Monitor LFTs in the setting of COVID-19 infection.  Patient denies any abdominal pain, alk phos and TB is normal  PAF:In normal sinus rhythm,on Eliquis and metoprolol will be continued  JFH:LKTGYBW chest pain. Continue her Lipitor,Eliquis.  Chronic diastolic heart failure, mild lower extremity edema.  Overall euvolemic.  Lasix was recently stopped. Continue her beta-blocker as tolerated.  Hypokalemia:Replete w 20 kcl daily.  Body mass index is 28.31 kg/m.   DVT prophylaxis:Eliquis Code Status:Full Family Communication: Plan of care discussed with patient at bedside.I called and updated her spouse POA-agrees with the plan. On his request I also called her daughter- home no [phone] ( cell is- [phone]) also and updated 12/3. Called and updated daughter again today. Disposition Plan: Remains inpatient pending clinical improvement hypoxia,COVID-19 pneumonia. Pt eval.   Consultants: none Procedures:none Microbiology: covid 19 +  Antimicrobials: Anti-infectives (From admission, onward)   Start     Dose/Rate Route Frequency Ordered Stop   06/21/19 1000  remdesivir 100 mg in sodium chloride 0.9 % 250 mL IVPB  Status:  Discontinued     100 mg 500 mL/hr over  30 Minutes Intravenous Every 24 hours 06/20/19 0002 06/20/19 0827   06/21/19 1000  remdesivir 100 mg in sodium chloride 0.9 % 250 mL IVPB     100 mg 500 mL/hr over 30 Minutes Intravenous Daily 06/20/19 0829 06/24/19 0959   06/20/19 1600  remdesivir 100 mg  in sodium chloride 0.9 % 250 mL IVPB     100 mg 500 mL/hr over 30 Minutes Intravenous  Once 06/20/19 0829 06/21/19 0058   06/20/19 0015  remdesivir 200 mg in sodium chloride 0.9 % 250 mL IVPB     200 mg 500 mL/hr over 30 Minutes Intravenous Once 06/20/19 0002 06/20/19 0122       Objective: Vitals:   06/21/19 1037 06/21/19 1038 06/21/19 1047 06/21/19 1049  BP: (!) 155/68     Pulse: 78     Resp:      Temp: (!) 97.5 F (36.4 C)     TempSrc: Oral     SpO2: 92% 90% (!) 89% 92%  Weight:      Height:        Intake/Output Summary (Last 24 hours) at 06/21/2019 1318 Last data filed at 06/21/2019 1100 Gross per 24 hour  Intake 391.26 ml  Output -  Net 391.26 ml   Filed Weights   06/20/19 0132  Weight: 72.5 kg   Weight change:   Body mass index is 28.31 kg/m.  Intake/Output from previous day: 12/02 0701 - 12/03 0700 In: 450 [P.O.:200; IV Piggyback:250] Out: -  Intake/Output this shift: Total I/O In: 141.3 [IV Piggyback:141.3] Out: -   Examination:  General exam: Alert awake on nasal oxygen not in acute distress frail, appears weak. Elderly. HEENT:Oral mucosa moist, Ear/Nose WNL grossly, dentition normal. Respiratory system: Bilateral diminished lung sounds with crackles at the base, no use of accessory muscle  Cardiovascular system: S1 & S2 +, No JVD,. Gastrointestinal system: Abdomen soft, NT,ND, BS+ Nervous System:Alert, awake, moving extremities and grossly nonfocal Extremities: mild leg edema, distal peripheral pulses palpable.  Skin: No rashes,no icterus. MSK: Normal muscle bulk,tone, power  Medications:  Scheduled Meds: . apixaban  5 mg Oral BID  . atorvastatin  40 mg Oral q1800  . dexamethasone (DECADRON) injection  6 mg Intravenous Q24H  . mouth rinse  15 mL Mouth Rinse BID  . metoprolol succinate  25 mg Oral QHS  . montelukast  10 mg Oral QHS   Continuous Infusions: . remdesivir 100 mg in NS 250 mL 500 mL/hr at 06/21/19 1100    Data Reviewed: I  have personally reviewed following labs and imaging studies  CBC: Recent Labs  Lab 06/19/19 2155 06/20/19 0440 06/21/19 0351  WBC 6.0 5.5 6.6  NEUTROABS 5.2 5.1 6.0  HGB 11.4* 11.1* 11.8*  HCT 35.3* 35.3* 36.9  MCV 93.6 95.4 93.4  PLT 164 151 174   Basic Metabolic Panel: Recent Labs  Lab 06/19/19 2155 06/20/19 0440 06/21/19 0351  NA 138 139 139  K 3.0* 3.8 3.4*  CL 102 107 106  CO2 22 20* 22  GLUCOSE 148* 173* 180*  BUN 16 16 27*  CREATININE 0.98 0.88 0.82  CALCIUM 8.0* 7.8* 8.3*  MG  --  1.9 2.0  PHOS  --  2.6 2.5   GFR: Estimated Creatinine Clearance: 39.1 mL/min (by C-G formula based on SCr of 0.82 mg/dL). Liver Function Tests: Recent Labs  Lab 06/19/19 2155 06/20/19 0440 06/21/19 0351  AST 80* 84* 180*  ALT 68* 74* 169*  ALKPHOS 91 91 95  BILITOT 1.1  1.1 0.9  PROT 6.2* 5.9* 6.0*  ALBUMIN 2.9* 2.6* 2.6*   No results for input(s): LIPASE, AMYLASE in the last 168 hours. No results for input(s): AMMONIA in the last 168 hours. Coagulation Profile: No results for input(s): INR, PROTIME in the last 168 hours. Cardiac Enzymes: No results for input(s): CKTOTAL, CKMB, CKMBINDEX, TROPONINI in the last 168 hours. BNP (last 3 results) No results for input(s): PROBNP in the last 8760 hours. HbA1C: No results for input(s): HGBA1C in the last 72 hours. CBG: No results for input(s): GLUCAP in the last 168 hours. Lipid Profile: Recent Labs    06/19/19 2155  TRIG 59   Thyroid Function Tests: No results for input(s): TSH, T4TOTAL, FREET4, T3FREE, THYROIDAB in the last 72 hours. Anemia Panel: Recent Labs    06/19/19 2155  FERRITIN 219   Sepsis Labs: Recent Labs  Lab 06/19/19 2155  PROCALCITON 0.10  LATICACIDVEN 1.1    Recent Results (from the past 240 hour(s))  Blood Culture (routine x 2)     Status: None (Preliminary result)   Collection Time: 06/19/19  9:55 PM   Specimen: BLOOD  Result Value Ref Range Status   Specimen Description   Final     BLOOD LEFT HAND Performed at [facility] [address], [place], [place] [address]    Special Requests   Final    BOTTLES DRAWN AEROBIC AND ANAEROBIC Blood Culture adequate volume Performed at [facility] [address], [place], [place] [address]    Culture   Final    NO GROWTH 2 DAYS Performed at [facility] [address] [place], [place] [address]    Report Status PENDING  Incomplete  Blood Culture (routine x 2)     Status: None (Preliminary result)   Collection Time: 06/19/19  9:55 PM   Specimen: BLOOD  Result Value Ref Range Status   Specimen Description   Final    BLOOD LEFT ANTECUBITAL Performed at [facility] [address] [place], [place] [address]    Special Requests   Final    BOTTLES DRAWN AEROBIC AND ANAEROBIC Blood Culture adequate volume Performed at [facility] [address], [place], [place] [address]    Culture   Final    NO GROWTH 2 DAYS Performed at [facility] [address] [place],  [address]    Report Status PENDING  Incomplete      Radiology Studies: Dg Chest Port 1 View  Result Date: 06/19/2019 CLINICAL DATA:  COVID, cough EXAM: PORTABLE CHEST 1 VIEW COMPARISON:  08/29/2017 FINDINGS: Patchy bilateral airspace opacities are noted. Heart is borderline in size. No visible significant effusions or acute bony abnormality. IMPRESSION: Patchy bilateral airspace disease concerning for pneumonia. Electronically Signed   By: Rolm Baptise M.D.   On: 06/19/2019 22:08      LOS: 2 days   Time spent: More than 50% of that time was spent in counseling and/or coordination of care.  Antonieta Pert, MD [facility]  06/21/2019, 1:18 PM

## 2019-06-21 NOTE — TOC Progression Note (Addendum)
Transition of Care [facility]) - Progression Note    Patient Details  Name: [name] MRN: [id number] Date of Birth: 06-09-1922  Transition of [facility]) CM/SW Contact  Purcell Mouton, RN Phone Number: 06/21/2019, 3:21 PM  Clinical Narrative:    Pt is from ID living at [facility] and will return to [facility] SNF [phone].    Expected Discharge Plan: [place] Barriers to Discharge: No Barriers Identified  Expected Discharge Plan and Services Expected Discharge Plan: [place]   Discharge Planning Services: CM Consult   Living arrangements for the past 2 months: [place]                                       Social Determinants of Health (SDOH) Interventions    Readmission Risk Interventions No flowsheet data found.

## 2019-06-22 LAB — COMPREHENSIVE METABOLIC PANEL
ALT: 141 U/L — ABNORMAL HIGH (ref 0–44)
AST: 90 U/L — ABNORMAL HIGH (ref 15–41)
Albumin: 2.5 g/dL — ABNORMAL LOW (ref 3.5–5.0)
Alkaline Phosphatase: 84 U/L (ref 38–126)
Anion gap: 11 (ref 5–15)
BUN: 35 mg/dL — ABNORMAL HIGH (ref 8–23)
CO2: 21 mmol/L — ABNORMAL LOW (ref 22–32)
Calcium: 8.2 mg/dL — ABNORMAL LOW (ref 8.9–10.3)
Chloride: 108 mmol/L (ref 98–111)
Creatinine, Ser: 0.9 mg/dL (ref 0.44–1.00)
GFR calc Af Amer: >60 mL/min (ref >60)
GFR calc non Af Amer: 54 mL/min — ABNORMAL LOW (ref >60)
Glucose, Bld: 189 mg/dL — ABNORMAL HIGH (ref 70–99)
Potassium: 4.1 mmol/L (ref 3.5–5.1)
Sodium: 140 mmol/L (ref 135–145)
Total Bilirubin: 0.9 mg/dL (ref 0.3–1.2)
Total Protein: 5.4 g/dL — ABNORMAL LOW (ref 6.5–8.1)

## 2019-06-22 LAB — CBC WITH DIFFERENTIAL/PLATELET
Abs Immature Granulocytes: 0.08 10*3/uL — ABNORMAL HIGH (ref 0.00–0.07)
Basophils Absolute: 0 10*3/uL (ref 0.0–0.1)
Basophils Relative: 0 %
Eosinophils Absolute: 0 10*3/uL (ref 0.0–0.5)
Eosinophils Relative: 0 %
HCT: 34 % — ABNORMAL LOW (ref 36.0–46.0)
Hemoglobin: 11.2 g/dL — ABNORMAL LOW (ref 12.0–15.0)
Immature Granulocytes: 1 %
Lymphocytes Relative: 3 %
Lymphs Abs: 0.3 10*3/uL — ABNORMAL LOW (ref 0.7–4.0)
MCH: 30.1 pg (ref 26.0–34.0)
MCHC: 32.9 g/dL (ref 30.0–36.0)
MCV: 91.4 fL (ref 80.0–100.0)
Monocytes Absolute: 0.3 10*3/uL (ref 0.1–1.0)
Monocytes Relative: 3 %
Neutro Abs: 9.1 10*3/uL — ABNORMAL HIGH (ref 1.7–7.7)
Neutrophils Relative %: 93 %
Platelets: 216 10*3/uL (ref 150–400)
RBC: 3.72 MIL/uL — ABNORMAL LOW (ref 3.87–5.11)
RDW: 13.2 % (ref 11.5–15.5)
WBC: 9.9 10*3/uL (ref 4.0–10.5)
nRBC: 0 % (ref 0.0–0.2)

## 2019-06-22 LAB — MAGNESIUM: Magnesium: 2 mg/dL (ref 1.7–2.4)

## 2019-06-22 LAB — PHOSPHORUS: Phosphorus: 2.8 mg/dL (ref 2.5–4.6)

## 2019-06-22 LAB — C-REACTIVE PROTEIN: CRP: 8.8 mg/dL — ABNORMAL HIGH (ref <1.0)

## 2019-06-22 LAB — D-DIMER, QUANTITATIVE: D-Dimer, Quant: 0.9 ug/mL-FEU — ABNORMAL HIGH (ref 0.00–0.50)

## 2019-06-22 MED ORDER — SODIUM CHLORIDE 0.9 % IV SOLN
INTRAVENOUS | Status: DC | PRN
  Administered 2019-06-22 – 2019-06-23 (×2): 250 mL via INTRAVENOUS

## 2019-06-22 NOTE — Evaluation (Signed)
Physical Therapy Evaluation Patient Details Name: [name] MRN: [id number] DOB: [birth year] Today's Date: 06/22/2019   History of Present Illness  85 y.o. female with CAD, chronic diastolic CHF, PAF on Eliquis, history of breast cancer recent diagnosis of COVID-19 on 11/28 at urgent care center and discharged back to her apartment where she lives with her husband who is also Covid positive.  Pt found to have hypoxia at facility and admitted for Pneumonia due to COVID-19 virus  Clinical Impression  Pt admitted with above diagnosis.  Pt currently with functional limitations due to the deficits listed below (see PT Problem List). Pt will benefit from skilled PT to increase their independence and safety with mobility to allow discharge to the venue listed below.  Pt agreeable to mobilize however fatigued quickly.  Pt unsteady with ambulating in room and Spo2 dropped while on 2L O2 [place].  Pt requiring 3L O2 [place] for Spo2 91% upon leaving room.  Pt would benefit from SNF upon d/c.     Follow Up Recommendations SNF;Supervision/Assistance - 24 hour    Equipment Recommendations  Rolling walker with 5" wheels    Recommendations for Other Services       Precautions / Restrictions Precautions Precautions: Fall Precaution Comments: monitor sats      Mobility  Bed Mobility Overal bed mobility: Needs Assistance Bed Mobility: Supine to Sit;Sit to Supine     Supine to sit: Min guard Sit to supine: Min guard   General bed mobility comments: slow and effortful however not assist required, monitored lines  Transfers Overall transfer level: Needs assistance Equipment used: 1 person hand held assist Transfers: Sit to/from Stand Sit to Stand: Min assist         General transfer comment: assist to rise and steady  Ambulation/Gait Ambulation/Gait assistance: Min assist Gait Distance (Feet): 20 Feet Assistive device: Straight cane Gait Pattern/deviations: Step-through pattern;Decreased  stride length;Wide base of support     General Gait Details: very unsteady however pt declined using RW or holding IV pole, assist to stabilize, remained on 2L O2 [place] however SPO2 dropped to 83% so encouraged pursed lip breathing and increased O2 to 3L, SPO2 slowly improved to 91% upon leaving room on 3L (RN aware)  Stairs            Wheelchair Mobility    Modified Rankin (Stroke Patients Only)       Balance Overall balance assessment: Needs assistance         Standing balance support: Single extremity supported Standing balance-Leahy Scale: Poor Standing balance comment: reliant on UE support                             Pertinent Vitals/Pain Pain Assessment: No/denies pain    Home Living   Living Arrangements: Spouse/significant other   Type of Home: Independent living facility       Home Layout: One level Home Equipment: Walker - 2 wheels;Cane - single point      Prior Function Level of Independence: Independent with assistive device(s)         Comments: has been using cane with wide BOS     Hand Dominance        Extremity/Trunk Assessment        Lower Extremity Assessment Lower Extremity Assessment: Generalized weakness    Cervical / Trunk Assessment Cervical / Trunk Assessment: Kyphotic  Communication   Communication: HOH  Cognition Arousal/Alertness: Awake/alert Behavior During Therapy:  WFL for tasks assessed/performed Overall Cognitive Status: Difficult to assess                                 General Comments: pt appears HOH, also difficult due to airborne room filter      General Comments      Exercises     Assessment/Plan    PT Assessment Patient needs continued PT services  PT Problem List Decreased strength;Decreased mobility;Decreased activity tolerance;Decreased balance;Decreased knowledge of use of DME;Cardiopulmonary status limiting activity       PT Treatment Interventions DME  instruction;Therapeutic activities;Balance training;Functional mobility training;Therapeutic exercise;Gait training;Patient/family education    PT Goals (Current goals can be found in the Care Plan section)  Acute Rehab PT Goals PT Goal Formulation: With patient Time For Goal Achievement: 07/06/19 Potential to Achieve Goals: Good    Frequency Min 2X/week   Barriers to discharge        Co-evaluation               AM-PAC PT "6 Clicks" Mobility  Outcome Measure Help needed turning from your back to your side while in a flat bed without using bedrails?: A Little Help needed moving from lying on your back to sitting on the side of a flat bed without using bedrails?: A Little Help needed moving to and from a bed to a chair (including a wheelchair)?: A Little Help needed standing up from a chair using your arms (e.g., wheelchair or bedside chair)?: A Little Help needed to walk in hospital room?: A Little Help needed climbing 3-5 steps with a railing? : A Lot 6 Click Score: 17    End of Session Equipment Utilized During Treatment: Gait belt;Oxygen Activity Tolerance: Patient limited by fatigue Patient left: in bed;with bed alarm set;with call bell/phone within reach Nurse Communication: Mobility status PT Visit Diagnosis: Difficulty in walking, not elsewhere classified (R26.2);Muscle weakness (generalized) (M62.81)    Time: [id number] PT Time Calculation (min) (ACUTE ONLY): 34 min   Charges:   PT Evaluation $PT Eval Low Complexity: 1 Low PT Treatments $Gait Training: 8-22 mins       Carmelia Bake, PT, DPT Acute Rehabilitation Services Office: [phone] Pager: [phone]  Trena Platt 06/22/2019, 4:09 PM

## 2019-06-22 NOTE — Progress Notes (Signed)
PROGRESS NOTE    [name]  [id number] DOB: 09/25/1921 DOA: 06/19/2019 PCP: Erline Hau, MD   Brief Narrative: 85 y.o.  female with CAD, chronic diastolic CHF, PAF on Eliquis, history of breast cancer recent diagnosis of COVID-19 on 11/28 at urgent care center and discharged back to her apartment where she lives with her husband who is also Covid positive.  Patient continued nausea vomiting, and husband not able to take care of her and was admitted to a skilled nursing facility for further monitoring, was in azithromycin. At SNF she was found to have hypoxia in mid 80s chest x-ray with bilateral opacities and sent to the ER.   In [id number], saturating mid 80s on room air, tachypneic to 33, and with stable blood pressure.  EKG features a sinus rhythm with LVH.  Chest x-ray is concerning for patchy bilateral airspace disease.   LABS-albumin 2.9, AST 80, and ALT 68.  CBC is notable for mild normocytic anemia.  Lactic acid NL, Blood cultures sent, placed on Decadron, supplemental oxygen and was admitted .  Rapid Covid is +    Subjective: Reports she is feeling better today. No acute events overnight, oxygen weaned down from 3 to 2 L saturating at 93%.  Assessment & Plan:   Pneumonia due to COVID-19 virus: Slowly improving, still needing oxygen.  Inflammatory markers are stable/improving CRP 18.5->19.3->17.3 8.8,ferritin 219 nl.D dimer 1.33->1.15->1.24->0.9. procalcitonin stable at 0.1. LFts also improving today. Continue on Decadron 6 mg, remdesivir pharmacy dosing. Continue to monitor inflammatory markers daily.  Blood culture  NGTD.   Acute hypoxic respiratory failure due to #1: Improving, on 2l [place], overnight placed on 3l. Continue supplemental oxygen and wean O2 as tolerated continue treatment plan as above. On admission was tachypneic up to 33 and needed o2 up to 3l/min .  Nausea and vomiting- no issues.   Metabolic acidosis: Improving, hco3 at 21, encourage oral  intake.    Abnormal LFTs with AST/ALT 84/74 -->worsened to 180/169, TB 0.9. ALP normal-> today ast/alt better 90/141. Monitor LFTs in the setting of COVID-19 infection, and also on remdesivir.  PAF:In normal sinus rhythm,on Eliquis and metoprolol  [id number] chest pain. Continue her Lipitor,Eliquis.  Chronic diastolic heart failure, mild lower extremity edema.  Overall euvolemic.  Lasix was recently stopped. Continue her beta-blocker as tolerated.  Hypokalemia:Replete w 20 kcl daily.  Body mass index is 28.31 kg/m.   DVT prophylaxis:Eliquis Code Status:Full Family Communication: Plan of care discussed with patient at bedside.I had called and updated her spouse POA-agrees with the plan. I had also called her daughter and updated ( her home no [id number], cell is- [id number] Disposition Plan: Remains inpatient pending clinical improvement hypoxia,COVID-19 pneumonia. Pt eval.   Consultants: none Procedures:none Microbiology: covid 19 +  Antimicrobials: Anti-infectives (From admission, onward)   Start     Dose/Rate Route Frequency Ordered Stop   06/21/19 1000  remdesivir 100 mg in sodium chloride 0.9 % 250 mL IVPB  Status:  Discontinued     100 mg 500 mL/hr over 30 Minutes Intravenous Every 24 hours 06/20/19 0002 06/20/19 0827   06/21/19 1000  remdesivir 100 mg in sodium chloride 0.9 % 250 mL IVPB     100 mg 500 mL/hr over 30 Minutes Intravenous Daily 06/20/19 0829 06/24/19 0959   06/20/19 1600  remdesivir 100 mg in sodium chloride 0.9 % 250 mL IVPB     100 mg 500 mL/hr over 30 Minutes Intravenous  Once 06/20/19 0829 06/21/19 0058  06/20/19 0015  remdesivir 200 mg in sodium chloride 0.9 % 250 mL IVPB     200 mg 500 mL/hr over 30 Minutes Intravenous Once 06/20/19 0002 06/20/19 0122       Objective: Vitals:   06/21/19 2104 06/22/19 0817 06/22/19 1103 06/22/19 1152  BP: (!) 180/66 (!) 152/70  (!) 151/64  Pulse: 87 80  72  Resp: 20   (!) 24  Temp: 98.1 F (36.7 C)   98.4  F (36.9 C)  TempSrc: Oral   Oral  SpO2: 90% 93% 94% 93%  Weight:      Height:        Intake/Output Summary (Last 24 hours) at 06/22/2019 1339 Last data filed at 06/22/2019 1317 Gross per 24 hour  Intake 120 ml  Output -  Net 120 ml   Filed Weights   06/20/19 0132  Weight: 72.5 kg   Weight change:   Body mass index is 28.31 kg/m.  Intake/Output from previous day: 12/03 0701 - 12/04 0700 In: 381.3 [P.O.:240; IV Piggyback:141.3] Out: -  Intake/Output this shift: Total I/O In: 120 [P.O.:120] Out: -   Examination:  General exam: Alert awake oriented, not in acute distress, on nasal cannula oxygen,  HEENT:Oral mucosa moist, Ear/Nose WNL grossly, dentition normal. Respiratory system: Bilateral clear breath sounds , diminished at base, no use of accessory muscle  Cardiovascular system: S1 & S2 +, No JVD,. Gastrointestinal system: Abdomen soft, NT,ND, BS+ Nervous System:Alert, awake, moving extremities and grossly nonfocal Extremities: trace leg edema, distal peripheral pulses palpable.  Skin: No rashes,no icterus. MSK: Normal muscle bulk,tone, power  Medications:  Scheduled Meds: . apixaban  5 mg Oral BID  . atorvastatin  40 mg Oral q1800  . dexamethasone (DECADRON) injection  6 mg Intravenous Q24H  . mouth rinse  15 mL Mouth Rinse BID  . metoprolol succinate  25 mg Oral QHS  . montelukast  10 mg Oral QHS  . potassium chloride  20 mEq Oral Daily   Continuous Infusions: . sodium chloride 250 mL (06/22/19 1101)  . remdesivir 100 mg in NS 100 mL 100 mg (06/22/19 1102)    Data Reviewed: I have personally reviewed following labs and imaging studies  CBC: Recent Labs  Lab 06/19/19 2155 06/20/19 0440 06/21/19 0351 06/22/19 0343  WBC 6.0 5.5 6.6 9.9  NEUTROABS 5.2 5.1 6.0 9.1*  HGB 11.4* 11.1* 11.8* 11.2*  HCT 35.3* 35.3* 36.9 34.0*  MCV 93.6 95.4 93.4 91.4  PLT 164 151 185 [id number]   Basic Metabolic Panel: Recent Labs  Lab 06/19/19 2155 06/20/19 0440  06/21/19 0351 06/22/19 0343  NA 138 139 139 140  K 3.0* 3.8 3.4* 4.1  CL 102 107 106 108  CO2 22 20* 22 21*  GLUCOSE 148* 173* 180* 189*  BUN 16 16 27* 35*  CREATININE 0.98 0.88 0.82 0.90  CALCIUM 8.0* 7.8* 8.3* 8.2*  MG  --  1.9 2.0 2.0  PHOS  --  2.6 2.5 2.8   GFR: Estimated Creatinine Clearance: 35.7 mL/min (by C-G formula based on SCr of 0.9 mg/dL). Liver Function Tests: Recent Labs  Lab 06/19/19 2155 06/20/19 0440 06/21/19 0351 06/22/19 0343  AST 80* 84* 180* 90*  ALT 68* 74* 169* 141*  ALKPHOS 91 91 95 84  BILITOT 1.1 1.1 0.9 0.9  PROT 6.2* 5.9* 6.0* 5.4*  ALBUMIN 2.9* 2.6* 2.6* 2.5*   No results for input(s): LIPASE, AMYLASE in the last 168 hours. No results for input(s): AMMONIA in the last 168  hours. Coagulation Profile: No results for input(s): INR, PROTIME in the last 168 hours. Cardiac Enzymes: No results for input(s): CKTOTAL, CKMB, CKMBINDEX, TROPONINI in the last 168 hours. BNP (last 3 results) No results for input(s): PROBNP in the last 8760 hours. HbA1C: No results for input(s): HGBA1C in the last 72 hours. CBG: No results for input(s): GLUCAP in the last 168 hours. Lipid Profile: Recent Labs    06/19/19 2155  TRIG 59   Thyroid Function Tests: No results for input(s): TSH, T4TOTAL, FREET4, T3FREE, THYROIDAB in the last 72 hours. Anemia Panel: Recent Labs    06/19/19 2155  FERRITIN 219   Sepsis Labs: Recent Labs  Lab 06/19/19 2155  PROCALCITON 0.10  LATICACIDVEN 1.1    Recent Results (from the past 240 hour(s))  Blood Culture (routine x 2)     Status: None (Preliminary result)   Collection Time: 06/19/19  9:55 PM   Specimen: BLOOD  Result Value Ref Range Status   Specimen Description   Final    BLOOD LEFT HAND Performed at [facility] [address], [place], [place] [address]    Special Requests   Final    BOTTLES DRAWN AEROBIC AND ANAEROBIC Blood Culture adequate volume Performed at [facility] [address], [place], [place] [address]    Culture   Final    NO GROWTH 3 DAYS Performed at [facility], [place] [address] [place], [place] [address]    Report Status PENDING  Incomplete  Blood Culture (routine x 2)     Status: None (Preliminary result)   Collection Time: 06/19/19  9:55 PM   Specimen: BLOOD  Result Value Ref Range Status   Specimen Description   Final    BLOOD LEFT ANTECUBITAL Performed at [facility] [address], [place], [place] [address]    Special Requests   Final    BOTTLES DRAWN AEROBIC AND ANAEROBIC Blood Culture adequate volume Performed at [facility] [address] [place], [place] [address]    Culture   Final    NO GROWTH 3 DAYS Performed at [facility], [place] [address], [place], [place] [address]    Report Status PENDING  Incomplete      Radiology Studies: No results found.    LOS: 3 days   Time spent: More than 50% of that time was spent in counseling and/or coordination of care.  Antonieta Pert, MD [facility]  06/22/2019, 1:39 PM

## 2019-06-22 NOTE — Care Management Important Message (Signed)
Important Message  Patient Details IM Letter given to Rhea Pink SW to present to the Patient Name: [name] MRN: [id number] Date of Birth: 13-May-1922   Medicare Important Message Given:  Yes     Kerin Salen 06/22/2019, 12:17 PM

## 2019-06-23 LAB — COMPREHENSIVE METABOLIC PANEL
ALT: 108 U/L — ABNORMAL HIGH (ref 0–44)
AST: 48 U/L — ABNORMAL HIGH (ref 15–41)
Albumin: 2.6 g/dL — ABNORMAL LOW (ref 3.5–5.0)
Alkaline Phosphatase: 95 U/L (ref 38–126)
Anion gap: 12 (ref 5–15)
BUN: 36 mg/dL — ABNORMAL HIGH (ref 8–23)
CO2: 22 mmol/L (ref 22–32)
Calcium: 8.6 mg/dL — ABNORMAL LOW (ref 8.9–10.3)
Chloride: 108 mmol/L (ref 98–111)
Creatinine, Ser: 0.95 mg/dL (ref 0.44–1.00)
GFR calc Af Amer: 59 mL/min — ABNORMAL LOW (ref >60)
GFR calc non Af Amer: 51 mL/min — ABNORMAL LOW (ref >60)
Glucose, Bld: 206 mg/dL — ABNORMAL HIGH (ref 70–99)
Potassium: 4.5 mmol/L (ref 3.5–5.1)
Sodium: 142 mmol/L (ref 135–145)
Total Bilirubin: 0.5 mg/dL (ref 0.3–1.2)
Total Protein: 5.7 g/dL — ABNORMAL LOW (ref 6.5–8.1)

## 2019-06-23 LAB — D-DIMER, QUANTITATIVE: D-Dimer, Quant: 0.97 ug/mL-FEU — ABNORMAL HIGH (ref 0.00–0.50)

## 2019-06-23 LAB — CBC WITH DIFFERENTIAL/PLATELET
Abs Immature Granulocytes: 0.1 10*3/uL — ABNORMAL HIGH (ref 0.00–0.07)
Basophils Absolute: 0 10*3/uL (ref 0.0–0.1)
Basophils Relative: 0 %
Eosinophils Absolute: 0 10*3/uL (ref 0.0–0.5)
Eosinophils Relative: 0 %
HCT: 36.4 % (ref 36.0–46.0)
Hemoglobin: 11.2 g/dL — ABNORMAL LOW (ref 12.0–15.0)
Immature Granulocytes: 1 %
Lymphocytes Relative: 3 %
Lymphs Abs: 0.3 10*3/uL — ABNORMAL LOW (ref 0.7–4.0)
MCH: 29.9 pg (ref 26.0–34.0)
MCHC: 30.8 g/dL (ref 30.0–36.0)
MCV: 97.3 fL (ref 80.0–100.0)
Monocytes Absolute: 0.2 10*3/uL (ref 0.1–1.0)
Monocytes Relative: 2 %
Neutro Abs: 8.4 10*3/uL — ABNORMAL HIGH (ref 1.7–7.7)
Neutrophils Relative %: 94 %
Platelets: 250 10*3/uL (ref 150–400)
RBC: 3.74 MIL/uL — ABNORMAL LOW (ref 3.87–5.11)
RDW: 13.2 % (ref 11.5–15.5)
WBC: 9 10*3/uL (ref 4.0–10.5)
nRBC: 0 % (ref 0.0–0.2)

## 2019-06-23 LAB — MAGNESIUM: Magnesium: 2.2 mg/dL (ref 1.7–2.4)

## 2019-06-23 LAB — PHOSPHORUS: Phosphorus: 2.9 mg/dL (ref 2.5–4.6)

## 2019-06-23 LAB — C-REACTIVE PROTEIN: CRP: 7.3 mg/dL — ABNORMAL HIGH (ref <1.0)

## 2019-06-23 MED ORDER — SODIUM CHLORIDE 0.9 % IV SOLN
100.0000 mg | Freq: Every day | INTRAVENOUS | Status: AC
  Administered 2019-06-23: 100 mg via INTRAVENOUS
  Filled 2019-06-23: qty 100

## 2019-06-23 NOTE — Progress Notes (Signed)
PROGRESS NOTE    [name]  [id number] DOB: May 07, 1922 DOA: 06/19/2019 PCP: Erline Hau, MD   Brief Narrative: 85 y.o.  female with CAD, chronic diastolic CHF, PAF on Eliquis, history of breast cancer recent diagnosis of COVID-19 on 11/28 at urgent care center and discharged back to her apartment where she lives with her husband who is also Covid positive.  Patient continued nausea vomiting, and husband not able to take care of her and was admitted to a skilled nursing facility for further monitoring, was in azithromycin. At SNF she was found to have hypoxia in mid 80s chest x-ray with bilateral opacities and sent to the ER.   In [id number], saturating mid 80s on room air, tachypneic to 33, and with stable blood pressure.  EKG features a sinus rhythm with LVH.  Chest x-ray is concerning for patchy bilateral airspace disease.   LABS-albumin 2.9, AST 80, and ALT 68.  CBC is notable for mild normocytic anemia.  Lactic acid NL, Blood cultures sent, placed on Decadron, supplemental oxygen and was admitted .  Rapid Covid is +    Subjective: No acute events overnight.  Overall feeling better, denies nausea, vomiting, chest pain fever chills.  On 2 L nasal cannula.    Assessment & Plan:   Pneumonia due to COVID-19 virus: Slowly improving, on 2l [place], saturating 94%.Inflammatory markers are stable/improving CRP 18.5->19.3->17.3 8.8-> 7.3,ferritin 219 nl.D dimer 1.33->->0.9. procalcitonin stable at 0.1. LFts improving. Continue on Decadron 6 mg, remdesivir pharmacy dosing. Continue to monitor inflammatory markers daily.    Acute hypoxic respiratory failure due to #1: On 2 L nasal cannula.  Not in respiratory distress no tachypnea.  Continue supplemental oxygen, steroid as above.   Nausea and vomiting- no issues.  BUN is slowly uptrending-likely from steroid, Creatinine is a stable.encourage oral hydration.  Metabolic acidosis: Improved, hco3 at 22, encourage oral intake.     Abnormal LFTs with AST/ALT 84/74 -->worsened to 180/169, TB 0.9. ALP normal-> 12/3 ast/alt ijmproving at 48/108 . Monitor LFTs in the setting of COVID-19 infection, and while on remdesivir.  PAF:In normal sinus rhythm,on Eliquis and metoprolol  [id number] chest pain. Continue her Lipitor,Eliquis.  Chronic diastolic heart failure, mild lower extremity edema.  Overall euvolemic.  Lasix was recently stopped. Continue her beta-blocker as tolerated.  Hypokalemia: resolved. Stop kcl  Body mass index is 28.31 kg/m.   DVT prophylaxis:Eliquis Code Status:Full Family Communication: Plan of care discussed with patient at bedside.I had called and updated her spouse POA-agrees with the plan. I had also called her daughter and updated ( her home no [id number], cell is- [phone] few times.  Disposition Plan: Remains inpatient pending clinical improvement hypoxia,COVID-19 pneumonia. Pt eval obtainEd and Advised SNF  Consultants: none Procedures:none Microbiology: covid 19 +  Antimicrobials: Anti-infectives (From admission, onward)   Start     Dose/Rate Route Frequency Ordered Stop   06/23/19 1000  remdesivir 100 mg in sodium chloride 0.9 % 100 mL IVPB     100 mg 200 mL/hr over 30 Minutes Intravenous Daily 06/23/19 0657 06/23/19 1114   06/21/19 1000  remdesivir 100 mg in sodium chloride 0.9 % 250 mL IVPB  Status:  Discontinued     100 mg 500 mL/hr over 30 Minutes Intravenous Every 24 hours 06/20/19 0002 06/20/19 0827   06/21/19 1000  remdesivir 100 mg in sodium chloride 0.9 % 250 mL IVPB  Status:  Discontinued     100 mg 500 mL/hr over 30 Minutes Intravenous Daily 06/20/19  [id number] 06/23/19 0657   06/20/19 1600  remdesivir 100 mg in sodium chloride 0.9 % 250 mL IVPB     100 mg 500 mL/hr over 30 Minutes Intravenous  Once 06/20/19 0829 06/21/19 0058   06/20/19 0015  remdesivir 200 mg in sodium chloride 0.9 % 250 mL IVPB     200 mg 500 mL/hr over 30 Minutes Intravenous Once 06/20/19 0002 06/20/19  0122       Objective: Vitals:   06/22/19 2141 06/23/19 0620 06/23/19 0905 06/23/19 1051  BP: (!) 144/58 (!) 169/69 (!) 160/71   Pulse: 89 81 78   Resp: (!) 22 (!) 22    Temp: 98.4 F (36.9 C) (!) 97.4 F (36.3 C)    TempSrc: Oral Oral    SpO2: 94% 98% 94% 94%  Weight:      Height:        Intake/Output Summary (Last 24 hours) at 06/23/2019 1232 Last data filed at 06/23/2019 0600 Gross per 24 hour  Intake 959.45 ml  Output 100 ml  Net 859.45 ml   Filed Weights   06/20/19 0132  Weight: 72.5 kg   Weight change:   Body mass index is 28.31 kg/m.  Intake/Output from previous day: 12/04 0701 - 12/05 0700 In: 959.5 [P.O.:570; I.V.:28.8; IV Piggyback:360.6] Out: 100 [Urine:100] Intake/Output this shift: No intake/output data recorded.  Examination:  General exam: Alert awake oriented, not in acute distress, on nasal cannula oxygen. HEENT:Oral mucosa moist, Ear/Nose WNL grossly normal , NO ICTERUS Respiratory system: B/; clear BS, no use of accessory muscle.  Cardiovascular system: S1 & S2 +,No JVD. Gastrointestinal system: Abdomen soft, NT,ND, BS+ Nervous System:Alert, awake, moving extremities and grossly nonfocal Extremities: mild leg edema, distal peripheral pulses palpable.  Skin: No rashes,no icterus. MSK: Normal muscle bulk,tone, power  Medications:  Scheduled Meds: . apixaban  5 mg Oral BID  . atorvastatin  40 mg Oral q1800  . dexamethasone (DECADRON) injection  6 mg Intravenous Q24H  . mouth rinse  15 mL Mouth Rinse BID  . metoprolol succinate  25 mg Oral QHS  . montelukast  10 mg Oral QHS  . potassium chloride  20 mEq Oral Daily   Continuous Infusions: . sodium chloride 250 mL (06/23/19 1043)    Data Reviewed: I have personally reviewed following labs and imaging studies  CBC: Recent Labs  Lab 06/19/19 2155 06/20/19 0440 06/21/19 0351 06/22/19 0343 06/23/19 0315  WBC 6.0 5.5 6.6 9.9 9.0  NEUTROABS 5.2 5.1 6.0 9.1* 8.4*  HGB 11.4* 11.1*  11.8* 11.2* 11.2*  HCT 35.3* 35.3* 36.9 34.0* 36.4  MCV 93.6 95.4 93.4 91.4 97.3  PLT 164 151 185 216 [id number]   Basic Metabolic Panel: Recent Labs  Lab 06/19/19 2155 06/20/19 0440 06/21/19 0351 06/22/19 0343 06/23/19 0315  NA 138 139 139 140 142  K 3.0* 3.8 3.4* 4.1 4.5  CL 102 107 106 108 108  CO2 22 20* 22 21* 22  GLUCOSE 148* 173* 180* 189* 206*  BUN 16 16 27* 35* 36*  CREATININE 0.98 0.88 0.82 0.90 0.95  CALCIUM 8.0* 7.8* 8.3* 8.2* 8.6*  MG  --  1.9 2.0 2.0 2.2  PHOS  --  2.6 2.5 2.8 2.9   GFR: Estimated Creatinine Clearance: 33.8 mL/min (by C-G formula based on SCr of 0.95 mg/dL). Liver Function Tests: Recent Labs  Lab 06/19/19 2155 06/20/19 0440 06/21/19 0351 06/22/19 0343 06/23/19 0315  AST 80* 84* 180* 90* 48*  ALT 68* 74* 169* 141* 108*  ALKPHOS 91 91 95 84 95  BILITOT 1.1 1.1 0.9 0.9 0.5  PROT 6.2* 5.9* 6.0* 5.4* 5.7*  ALBUMIN 2.9* 2.6* 2.6* 2.5* 2.6*   No results for input(s): LIPASE, AMYLASE in the last 168 hours. No results for input(s): AMMONIA in the last 168 hours. Coagulation Profile: No results for input(s): INR, PROTIME in the last 168 hours. Cardiac Enzymes: No results for input(s): CKTOTAL, CKMB, CKMBINDEX, TROPONINI in the last 168 hours. BNP (last 3 results) No results for input(s): PROBNP in the last 8760 hours. HbA1C: No results for input(s): HGBA1C in the last 72 hours. CBG: No results for input(s): GLUCAP in the last 168 hours. Lipid Profile: No results for input(s): CHOL, HDL, LDLCALC, TRIG, CHOLHDL, LDLDIRECT in the last 72 hours. Thyroid Function Tests: No results for input(s): TSH, T4TOTAL, FREET4, T3FREE, THYROIDAB in the last 72 hours. Anemia Panel: No results for input(s): VITAMINB12, FOLATE, FERRITIN, TIBC, IRON, RETICCTPCT in the last 72 hours. Sepsis Labs: Recent Labs  Lab 06/19/19 2155  PROCALCITON 0.10  LATICACIDVEN 1.1    Recent Results (from the past 240 hour(s))  Blood Culture (routine x 2)     Status: None  (Preliminary result)   Collection Time: 06/19/19  9:55 PM   Specimen: BLOOD  Result Value Ref Range Status   Specimen Description   Final    BLOOD LEFT HAND Performed at [facility] [address], [place], [place] [address]    Special Requests   Final    BOTTLES DRAWN AEROBIC AND ANAEROBIC Blood Culture adequate volume Performed at [facility] [address], [place], [place] [address]    Culture   Final    NO GROWTH 4 DAYS Performed at [facility], [place] [address], [place], [place] [address]    Report Status PENDING  Incomplete  Blood Culture (routine x 2)     Status: None (Preliminary result)   Collection Time: 06/19/19  9:55 PM   Specimen: BLOOD  Result Value Ref Range Status   Specimen Description   Final    BLOOD LEFT ANTECUBITAL Performed at [facility] [address] [place], [place] [address]    Special Requests   Final    BOTTLES DRAWN AEROBIC AND ANAEROBIC Blood Culture adequate volume Performed at [facility] [address], [place], [place] [address]    Culture   Final    NO GROWTH 4 DAYS Performed at [facility], [place] [address], [place], [place] [address]    Report Status PENDING  Incomplete      Radiology Studies: No results found.    LOS: 4 days   Time spent: More than 50% of that time was spent in counseling and/or coordination of care.  Antonieta Pert, MD [facility]  06/23/2019, 12:32 PM

## 2019-06-24 LAB — CULTURE, BLOOD (ROUTINE X 2)
Culture: NO GROWTH
Culture: NO GROWTH
Special Requests: ADEQUATE
Special Requests: ADEQUATE

## 2019-06-24 LAB — COMPREHENSIVE METABOLIC PANEL
ALT: 87 U/L — ABNORMAL HIGH (ref 0–44)
AST: 29 U/L (ref 15–41)
Albumin: 2.7 g/dL — ABNORMAL LOW (ref 3.5–5.0)
Alkaline Phosphatase: 88 U/L (ref 38–126)
Anion gap: 11 (ref 5–15)
BUN: 36 mg/dL — ABNORMAL HIGH (ref 8–23)
CO2: 25 mmol/L (ref 22–32)
Calcium: 9 mg/dL (ref 8.9–10.3)
Chloride: 106 mmol/L (ref 98–111)
Creatinine, Ser: 0.97 mg/dL (ref 0.44–1.00)
GFR calc Af Amer: 58 mL/min — ABNORMAL LOW (ref >60)
GFR calc non Af Amer: 50 mL/min — ABNORMAL LOW (ref >60)
Glucose, Bld: 240 mg/dL — ABNORMAL HIGH (ref 70–99)
Potassium: 4.6 mmol/L (ref 3.5–5.1)
Sodium: 142 mmol/L (ref 135–145)
Total Bilirubin: 0.6 mg/dL (ref 0.3–1.2)
Total Protein: 5.7 g/dL — ABNORMAL LOW (ref 6.5–8.1)

## 2019-06-24 LAB — CBC WITH DIFFERENTIAL/PLATELET
Abs Immature Granulocytes: 0.16 10*3/uL — ABNORMAL HIGH (ref 0.00–0.07)
Basophils Absolute: 0 10*3/uL (ref 0.0–0.1)
Basophils Relative: 0 %
Eosinophils Absolute: 0 10*3/uL (ref 0.0–0.5)
Eosinophils Relative: 0 %
HCT: 36.7 % (ref 36.0–46.0)
Hemoglobin: 11.4 g/dL — ABNORMAL LOW (ref 12.0–15.0)
Immature Granulocytes: 2 %
Lymphocytes Relative: 4 %
Lymphs Abs: 0.3 10*3/uL — ABNORMAL LOW (ref 0.7–4.0)
MCH: 29.8 pg (ref 26.0–34.0)
MCHC: 31.1 g/dL (ref 30.0–36.0)
MCV: 95.8 fL (ref 80.0–100.0)
Monocytes Absolute: 0.2 10*3/uL (ref 0.1–1.0)
Monocytes Relative: 2 %
Neutro Abs: 8.8 10*3/uL — ABNORMAL HIGH (ref 1.7–7.7)
Neutrophils Relative %: 92 %
Platelets: 256 10*3/uL (ref 150–400)
RBC: 3.83 MIL/uL — ABNORMAL LOW (ref 3.87–5.11)
RDW: 13.3 % (ref 11.5–15.5)
WBC: 9.5 10*3/uL (ref 4.0–10.5)
nRBC: 0 % (ref 0.0–0.2)

## 2019-06-24 LAB — MAGNESIUM: Magnesium: 2.1 mg/dL (ref 1.7–2.4)

## 2019-06-24 LAB — PHOSPHORUS: Phosphorus: 3.4 mg/dL (ref 2.5–4.6)

## 2019-06-24 LAB — C-REACTIVE PROTEIN: CRP: 7.8 mg/dL — ABNORMAL HIGH (ref <1.0)

## 2019-06-24 LAB — D-DIMER, QUANTITATIVE: D-Dimer, Quant: 0.85 ug/mL-FEU — ABNORMAL HIGH (ref 0.00–0.50)

## 2019-06-24 MED ORDER — HYDRALAZINE HCL 20 MG/ML IJ SOLN
10.0000 mg | Freq: Four times a day (QID) | INTRAMUSCULAR | Status: DC | PRN
  Administered 2019-06-24: 10 mg via INTRAVENOUS
  Filled 2019-06-24: qty 1

## 2019-06-24 NOTE — Progress Notes (Signed)
Pt BP was slightly higher at 177/73 this morning. MD Blount made aware.

## 2019-06-24 NOTE — Progress Notes (Signed)
PROGRESS NOTE    [name]  [id number] DOB: 19-Apr-1922 DOA: 06/19/2019 PCP: Erline Hau, MD   Brief Narrative: 85 y.o.  female with CAD, chronic diastolic CHF, PAF on Eliquis, history of breast cancer recent diagnosis of COVID-19 on 11/28 at urgent care center and discharged back to her apartment where she lives with her husband who is also Covid positive.  Patient continued nausea vomiting, and husband not able to take care of her and was admitted to a skilled nursing facility for further monitoring, was in azithromycin. At SNF she was found to have hypoxia in mid 80s chest x-ray with bilateral opacities and sent to the ER.   In [id number], saturating mid 80s on room air, tachypneic to 33, and with stable blood pressure.  EKG features a sinus rhythm with LVH.  Chest x-ray is concerning for patchy bilateral airspace disease.   LABS-albumin 2.9, AST 80, and ALT 68.  CBC is notable for mild normocytic anemia.  Lactic acid NL, Blood cultures sent, placed on Decadron, supplemental oxygen and was admitted .  Rapid Covid is +   Patient was admitted continued on supplemental oxygen,Remdesever and Decadron as per protocol.  Subjective: No acute events overnight. Blood pressure has been running high up to 170s needed iv meds prn. On 1 L nasal cannula oxygen.  Wean down. Patient denies any complaint,.  Assessment & Plan:   Pneumonia due to COVID-19 virus: Slowly improving, on 2l [place], saturating 94%.Inflammatory markers are stable/improving CRP 18.5->19.3->7.8->,ferritin 219 nl.D dimer 1.33->->0.9. procalcitonin stable at 0.1. LFts improving. Continue on Decadron 6 mg, remdesivir pharmacy dosing. Continue to monitor inflammatory markers daily.    Recent Labs    06/22/19 0343 06/23/19 0315 06/24/19 0417  DDIMER 0.90* 0.97* 0.85*  CRP 8.8* 7.3* 7.8*   Acute hypoxic respiratory failure due to #1: On 1 L nasal cannula- cont to wean. Not in respiratory distress and not  tachypneuic.Continue supplemental oxygen, steroid as above.   Nausea and vomiting-resolved.  No issues.  BUN is slowly uptrending-likely from steroid, Creatinine is a stable.encourage oral hydration.  Metabolic acidosis: Improved, hco3 at 25, encourage oral intake.    Abnormal LFTs with AST/ALT 84/74 -->worsened to 180/169, TB 0.9. ALP normal-> 12/3 ast/alt ijmproved to 29/87.Monitor LFTs in the setting of COVID-19 infection, and while on remdesivir.  PAF:In normal sinus rhythm,on Eliquis and metoprolol  [id number] chest pain. Continue her Lipitor,Eliquis.  Chronic diastolic heart failure, mild lower extremity edema.  Overall euvolemic.  Lasix was recently stopped. Continue her beta-blocker as tolerated.  Hypokalemia: resolved. Stop kcl  Body mass index is 28.31 kg/m.   DVT prophylaxis:Eliquis Code Status:Full Family Communication: Plan of care discussed with patient at bedside.I had called and updated her spouse POA-agrees with the plan. I had also called her daughter and updated ( her home no [id number], cell is- [phone].  No new updates. Disposition Plan: Remains inpatient pending clinical improvement hypoxia,COVID-19 pneumonia. Pt eval obtain and advised SNF  Consultants: none Procedures:none Microbiology: covid 19 +  Antimicrobials: Anti-infectives (From admission, onward)   Start     Dose/Rate Route Frequency Ordered Stop   06/23/19 1000  remdesivir 100 mg in sodium chloride 0.9 % 100 mL IVPB     100 mg 200 mL/hr over 30 Minutes Intravenous Daily 06/23/19 0657 06/23/19 1114   06/21/19 1000  remdesivir 100 mg in sodium chloride 0.9 % 250 mL IVPB  Status:  Discontinued     100 mg 500 mL/hr over 30 Minutes Intravenous  Every 24 hours 06/20/19 0002 06/20/19 0827   06/21/19 1000  remdesivir 100 mg in sodium chloride 0.9 % 250 mL IVPB  Status:  Discontinued     100 mg 500 mL/hr over 30 Minutes Intravenous Daily 06/20/19 0829 06/23/19 0657   06/20/19 1600  remdesivir 100 mg  in sodium chloride 0.9 % 250 mL IVPB     100 mg 500 mL/hr over 30 Minutes Intravenous  Once 06/20/19 0829 06/21/19 0058   06/20/19 0015  remdesivir 200 mg in sodium chloride 0.9 % 250 mL IVPB     200 mg 500 mL/hr over 30 Minutes Intravenous Once 06/20/19 0002 06/20/19 0122       Objective: Vitals:   06/24/19 1056 06/24/19 1127 06/24/19 1259 06/24/19 1301  BP: (!) 175/64   (!) 134/49  Pulse: 77   76  Resp:    (!) 24  Temp:    97.8 F (36.6 C)  TempSrc:    Oral  SpO2:  92% (!) 88% 93%  Weight:      Height:        Intake/Output Summary (Last 24 hours) at 06/24/2019 1412 Last data filed at 06/24/2019 0700 Gross per 24 hour  Intake 469.42 ml  Output 750 ml  Net -280.58 ml   Filed Weights   06/20/19 0132  Weight: 72.5 kg   Weight change:   Body mass index is 28.31 kg/m.  Intake/Output from previous day: 12/05 0701 - 12/06 0700 In: 469.4 [P.O.:360; I.V.:9.4; IV Piggyback:100] Out: 750 [Urine:750] Intake/Output this shift: No intake/output data recorded.  Examination:  General exam: Alert awake, oriented, elderly, frail, nasal cannula oxygen in place not in acute distress  HEENT:Oral mucosa moist, Ear/Nose WNL grossly normal , NO ICTERUS Respiratory system: B/l  CLEAR Breath sounds no use of accessory muscles Cardiovascular system: S1 & S2 +,No JVD. Gastrointestinal system: Abdomen soft, NT,ND, BS+ Nervous System:Alert, awake, moving extremities and grossly nonfocal Extremities: Minimal edema, distal peripheral pulses palpable.  Skin: No rashes,no icterus. MSK: Normal muscle bulk,tone, power  Medications:  Scheduled Meds: . apixaban  5 mg Oral BID  . atorvastatin  40 mg Oral q1800  . dexamethasone (DECADRON) injection  6 mg Intravenous Q24H  . mouth rinse  15 mL Mouth Rinse BID  . metoprolol succinate  25 mg Oral QHS  . montelukast  10 mg Oral QHS   Continuous Infusions: . sodium chloride 250 mL (06/23/19 1043)    Data Reviewed: I have personally reviewed  following labs and imaging studies  CBC: Recent Labs  Lab 06/20/19 0440 06/21/19 0351 06/22/19 0343 06/23/19 0315 06/24/19 0417  WBC 5.5 6.6 9.9 9.0 9.5  NEUTROABS 5.1 6.0 9.1* 8.4* 8.8*  HGB 11.1* 11.8* 11.2* 11.2* 11.4*  HCT 35.3* 36.9 34.0* 36.4 36.7  MCV 95.4 93.4 91.4 97.3 95.8  PLT 151 185 216 250 [id number]   Basic Metabolic Panel: Recent Labs  Lab 06/20/19 0440 06/21/19 0351 06/22/19 0343 06/23/19 0315 06/24/19 0417  NA 139 139 140 142 142  K 3.8 3.4* 4.1 4.5 4.6  CL 107 106 108 108 106  CO2 20* 22 21* 22 25  GLUCOSE 173* 180* 189* 206* 240*  BUN 16 27* 35* 36* 36*  CREATININE 0.88 0.82 0.90 0.95 0.97  CALCIUM 7.8* 8.3* 8.2* 8.6* 9.0  MG 1.9 2.0 2.0 2.2 2.1  PHOS 2.6 2.5 2.8 2.9 3.4   GFR: Estimated Creatinine Clearance: 33.1 mL/min (by C-G formula based on SCr of 0.97 mg/dL). Liver Function Tests: Recent Labs  Lab 06/20/19 0440 06/21/19 0351 06/22/19 0343 06/23/19 0315 06/24/19 0417  AST 84* 180* 90* 48* 29  ALT 74* 169* 141* 108* 87*  ALKPHOS 91 95 84 95 88  BILITOT 1.1 0.9 0.9 0.5 0.6  PROT 5.9* 6.0* 5.4* 5.7* 5.7*  ALBUMIN 2.6* 2.6* 2.5* 2.6* 2.7*   No results for input(s): LIPASE, AMYLASE in the last 168 hours. No results for input(s): AMMONIA in the last 168 hours. Coagulation Profile: No results for input(s): INR, PROTIME in the last 168 hours. Cardiac Enzymes: No results for input(s): CKTOTAL, CKMB, CKMBINDEX, TROPONINI in the last 168 hours. BNP (last 3 results) No results for input(s): PROBNP in the last 8760 hours. HbA1C: No results for input(s): HGBA1C in the last 72 hours. CBG: No results for input(s): GLUCAP in the last 168 hours. Lipid Profile: No results for input(s): CHOL, HDL, LDLCALC, TRIG, CHOLHDL, LDLDIRECT in the last 72 hours. Thyroid Function Tests: No results for input(s): TSH, T4TOTAL, FREET4, T3FREE, THYROIDAB in the last 72 hours. Anemia Panel: No results for input(s): VITAMINB12, FOLATE, FERRITIN, TIBC, IRON,  RETICCTPCT in the last 72 hours. Sepsis Labs: Recent Labs  Lab 06/19/19 2155  PROCALCITON 0.10  LATICACIDVEN 1.1    Recent Results (from the past 240 hour(s))  Blood Culture (routine x 2)     Status: None   Collection Time: 06/19/19  9:55 PM   Specimen: BLOOD  Result Value Ref Range Status   Specimen Description   Final    BLOOD LEFT HAND Performed at [facility], [place] [address] [place], [place] [address]    Special Requests   Final    BOTTLES DRAWN AEROBIC AND ANAEROBIC Blood Culture adequate volume Performed at [place] [address], [place], [place] [address]    Culture   Final    NO GROWTH 5 DAYS Performed at [facility], [place] [address], [place], [place] [address]    Report Status 06/24/2019 FINAL  Final  Blood Culture (routine x 2)     Status: None   Collection Time: 06/19/19  9:55 PM   Specimen: BLOOD  Result Value Ref Range Status   Specimen Description   Final    BLOOD LEFT ANTECUBITAL Performed at [place] [address], [place], [place] [address]    Special Requests   Final    BOTTLES DRAWN AEROBIC AND ANAEROBIC Blood Culture adequate volume Performed at [place] [address], [place], [place] [address]    Culture   Final    NO GROWTH 5 DAYS Performed at [facility], [place] [address], [place],  [address]    Report Status 06/24/2019 FINAL  Final      Radiology Studies: No results found.    LOS: 5 days   Time spent: More than 50% of that time was spent in counseling and/or coordination of care.  Antonieta Pert, MD [facility]  06/24/2019, 2:12 PM

## 2019-06-25 NOTE — Care Management Important Message (Signed)
Important Message  Patient Details IM Letter given to [name] to present to the Patient Name: [name] MRN: [id number] Date of Birth: 02/05/1922   Medicare Important Message Given:  Yes     Kerin Salen 06/25/2019, 11:26 AM

## 2019-06-25 NOTE — Progress Notes (Signed)
Physical Therapy Treatment Patient Details Name: [name] MRN: [id number] DOB: June 02, 1922 Today's Date: 06/25/2019    History of Present Illness 85 y.o. female with CAD, chronic diastolic CHF, PAF on Eliquis, history of breast cancer recent diagnosis of COVID-19 on 11/28 at urgent care center and discharged back to her apartment where she lives with her husband who is also Covid positive.  Pt found to have hypoxia at facility and admitted for Pneumonia due to COVID-19 virus    PT Comments    Pt c/o fatigue on today. She required increased assistance on today compared to evaluation.  Pt is still requiring O2. Continue to recommend SNF.   Follow Up Recommendations  SNF     Equipment Recommendations  Rolling walker with 5" wheels    Recommendations for Other Services       Precautions / Restrictions Precautions Precautions: Fall Precaution Comments: monitor sats Restrictions Weight Bearing Restrictions: No    Mobility  Bed Mobility Overal bed mobility: Needs Assistance Bed Mobility: Supine to Sit     Supine to sit: Min assist     General bed mobility comments: Assist to scoot to EOB. Increased time.  Transfers Overall transfer level: Needs assistance Equipment used: Rolling walker (2 wheeled) Transfers: Sit to/from Stand Sit to Stand: Min assist         General transfer comment: assist to rise and steady  Ambulation/Gait Ambulation/Gait assistance: Min guard Gait Distance (Feet): 10 Feet Assistive device: Rolling walker (2 wheeled) Gait Pattern/deviations: Step-through pattern;Decreased stride length     General Gait Details: mildly unsteady but no LOB with RW use. O2 85% on RA, 89% on 2L. Pt fatigued easily.   Stairs             Wheelchair Mobility    Modified Rankin (Stroke Patients Only)       Balance Overall balance assessment: Needs assistance         Standing balance support: Bilateral upper extremity supported Standing  balance-Leahy Scale: Poor                              Cognition Arousal/Alertness: Awake/alert Behavior During Therapy: WFL for tasks assessed/performed Overall Cognitive Status: Difficult to assess                                 General Comments: pt appears HOH, also difficult due to airborne room filter      Exercises      General Comments        Pertinent Vitals/Pain Pain Assessment: No/denies pain    Home Living                      Prior Function            PT Goals (current goals can now be found in the care plan section) Progress towards PT goals: Progressing toward goals    Frequency    Min 2X/week      PT Plan Current plan remains appropriate    Co-evaluation              AM-PAC PT "6 Clicks" Mobility   Outcome Measure  Help needed turning from your back to your side while in a flat bed without using bedrails?: A Little Help needed moving from lying on your back to sitting on the side of a flat  bed without using bedrails?: A Little Help needed moving to and from a bed to a chair (including a wheelchair)?: A Little Help needed standing up from a chair using your arms (e.g., wheelchair or bedside chair)?: A Little Help needed to walk in hospital room?: A Little Help needed climbing 3-5 steps with a railing? : A Lot 6 Click Score: 17    End of Session Equipment Utilized During Treatment: Gait belt;Oxygen Activity Tolerance: Patient limited by fatigue Patient left: in bed;with call bell/phone within reach;with bed alarm set   PT Visit Diagnosis: Difficulty in walking, not elsewhere classified (R26.2);Muscle weakness (generalized) (M62.81)     Time: [id number] PT Time Calculation (min) (ACUTE ONLY): 18 min  Charges:  $Gait Training: 8-22 mins                        Weston Anna, PT Acute Rehabilitation Services Pager: [phone] Office: [phone]

## 2019-06-25 NOTE — Progress Notes (Addendum)
Notified by CCMD that pt had a 17 beat run of VTach. Pt asymptomatic. Currently NSR at 75. On call MD, [place], notified. Advised to check K and Mg in AM. Will continue to monitor.

## 2019-06-25 NOTE — Progress Notes (Signed)
PROGRESS NOTE  [name]  [id number] DOB: 14-Feb-1922 DOA: 06/19/2019 PCP: Erline Hau, MD   Brief Narrative: 85 y.o.  female with CAD, chronic diastolic CHF, PAF on Eliquis, history of breast cancer recent diagnosis of COVID-19 on 11/28 at urgent care center and discharged back to her apartment where she lives with her husband who is also Covid positive.  Patient continued nausea vomiting, and husband not able to take care of her and was admitted to a skilled nursing facility for further monitoring, was in azithromycin. At SNF she was found to have hypoxia in mid 80s chest x-ray with bilateral opacities and sent to the ER.   In [id number], saturating mid 80s on room air, tachypneic to 33, and with stable blood pressure.  EKG features a sinus rhythm with LVH.  Chest x-ray is concerning for patchy bilateral airspace disease.   LABS-albumin 2.9, AST 80, and ALT 68.  CBC is notable for mild normocytic anemia.  Lactic acid NL, Blood cultures sent, placed on Decadron, supplemental oxygen and was admitted .  Rapid Covid is +   Patient was admitted continued on supplemental oxygen,Remdesever and Decadron as per protocol.  Subjective:  Coughing after eating just now- reports choked on milk Saturating well on 2l [place] Reports feeling fine otherwise  Assessment & Plan:   Pneumonia due to COVID-19 virus: Slowly improving, on 2l [place], saturating 94%.Inflammatory markers are stable/improving CRP 18.5->19.3->7.8->,ferritin 219 nl.D dimer 1.33->->0.9. procalcitonin stable at 0.1. LFts improving. Continue on Decadron 6 mg, completed remdesivir x5 .Continue to monitor inflammatory markers daily CRP stable.    Recent Labs    06/23/19 0315 06/24/19 0417  DDIMER 0.97* 0.85*  CRP 7.3* 7.8*   Acute hypoxic respiratory failure due to #1: This morning still on 2 L nasal cannula, having some cough after eating.continue to wean oxygen.  Coughing with drinking, requested speech eval.  Nausea  and vomiting-resolved.  No issues.  BUN is slowly uptrending-likely from steroid, Creatinine is a stable.encourage oral hydration.  Metabolic acidosis: Improved. Encourage oral intake.    Abnormal LFTs with AST/ALT 84/74-->worsened to 180/169,TB 0.9. ALP normal-> 12/3 ast/alt ijmproved to 29/87.  PAF:In normal sinus rhythm,on Eliquis and metoprolol  [id number] chest pain. Continue her Lipitor,Eliquis.  Chronic diastolic heart failure, mild lower extremity edema.  Overall euvolemic.  Lasix was recently stopped. Continue her beta-blocker as tolerated.  Hypokalemia: resolved. Stop kcl  Body mass index is 28.31 kg/m.   DVT prophylaxis:Eliquis Code Status:Full Family Communication:Plan of care discussed with patient at bedside.I had called and updated her family- spouse daughter. Daughter's no (her home no [phone], cell is- [phone]. Disposition Plan:Remains inpatient pending clinical improvement hypoxia,COVID-19 pneumonia. Cont patient eval obtain and advised SNF.  Today updated son in law-agreeable to return to friend's home at [place]- in SNF part, patient originally from independent living unit, was with her husband   Consultants: none Procedures:none Microbiology: covid 19 +  Antimicrobials: Anti-infectives (From admission, onward)   Start     Dose/Rate Route Frequency Ordered Stop   06/23/19 1000  remdesivir 100 mg in sodium chloride 0.9 % 100 mL IVPB     100 mg 200 mL/hr over 30 Minutes Intravenous Daily 06/23/19 0657 06/23/19 1114   06/21/19 1000  remdesivir 100 mg in sodium chloride 0.9 % 250 mL IVPB  Status:  Discontinued     100 mg 500 mL/hr over 30 Minutes Intravenous Every 24 hours 06/20/19 0002 06/20/19 0827   06/21/19 1000  remdesivir 100 mg in  sodium chloride 0.9 % 250 mL IVPB  Status:  Discontinued     100 mg 500 mL/hr over 30 Minutes Intravenous Daily 06/20/19 0829 06/23/19 0657   06/20/19 1600  remdesivir 100 mg in sodium chloride 0.9 % 250 mL IVPB     100 mg  500 mL/hr over 30 Minutes Intravenous  Once 06/20/19 0829 06/21/19 0058   06/20/19 0015  remdesivir 200 mg in sodium chloride 0.9 % 250 mL IVPB     200 mg 500 mL/hr over 30 Minutes Intravenous Once 06/20/19 0002 06/20/19 0122       Objective: Vitals:   06/24/19 1259 06/24/19 1301 06/24/19 2224 06/25/19 0559  BP:  (!) 134/49 (!) 155/65 (!) 151/127  Pulse:  76 92 85  Resp:  (!) 24    Temp:  97.8 F (36.6 C)  98 F (36.7 C)  TempSrc:  Oral  Oral  SpO2: (!) 88% 93% 94% 93%  Weight:      Height:        Intake/Output Summary (Last 24 hours) at 06/25/2019 1012 Last data filed at 06/25/2019 0646 Gross per 24 hour  Intake 0 ml  Output 1200 ml  Net -1200 ml   Filed Weights   06/20/19 0132  Weight: 72.5 kg   Weight change:   Body mass index is 28.31 kg/m.  Intake/Output from previous day: 12/06 0701 - 12/07 0700 In: 0  Out: 1200 [Urine:1200] Intake/Output this shift: No intake/output data recorded.  Examination:  General exam: Alert awake, oriented, elderly, frail, nasal cannula oxygen, coughing and in mild distress. HEENT:Oral mucosa moist, Ear/Nose WNL grossly normal no icterus Respiratory system: B/l  Clear, no wheezes/crackles, no use of accessory muscles Cardiovascular system: S1 & S2 +,No JVD. Gastrointestinal system: Abdomen soft, NT,ND, BS+ Nervous System:Alert, awake, moving extremities and grossly nonfocal Extremities: Minimal edema, distal peripheral pulses palpable.  Skin: No rashes,no icterus. MSK: Normal muscle bulk,tone, power  Medications:  Scheduled Meds: . apixaban  5 mg Oral BID  . atorvastatin  40 mg Oral q1800  . dexamethasone (DECADRON) injection  6 mg Intravenous Q24H  . mouth rinse  15 mL Mouth Rinse BID  . metoprolol succinate  25 mg Oral QHS  . montelukast  10 mg Oral QHS   Continuous Infusions: . sodium chloride 250 mL (06/23/19 1043)    Data Reviewed: I have personally reviewed following labs and imaging studies  CBC: Recent  Labs  Lab 06/20/19 0440 06/21/19 0351 06/22/19 0343 06/23/19 0315 06/24/19 0417  WBC 5.5 6.6 9.9 9.0 9.5  NEUTROABS 5.1 6.0 9.1* 8.4* 8.8*  HGB 11.1* 11.8* 11.2* 11.2* 11.4*  HCT 35.3* 36.9 34.0* 36.4 36.7  MCV 95.4 93.4 91.4 97.3 95.8  PLT 151 185 216 250 [id number]   Basic Metabolic Panel: Recent Labs  Lab 06/20/19 0440 06/21/19 0351 06/22/19 0343 06/23/19 0315 06/24/19 0417  NA 139 139 140 142 142  K 3.8 3.4* 4.1 4.5 4.6  CL 107 106 108 108 106  CO2 20* 22 21* 22 25  GLUCOSE 173* 180* 189* 206* 240*  BUN 16 27* 35* 36* 36*  CREATININE 0.88 0.82 0.90 0.95 0.97  CALCIUM 7.8* 8.3* 8.2* 8.6* 9.0  MG 1.9 2.0 2.0 2.2 2.1  PHOS 2.6 2.5 2.8 2.9 3.4   GFR: Estimated Creatinine Clearance: 33.1 mL/min (by C-G formula based on SCr of 0.97 mg/dL). Liver Function Tests: Recent Labs  Lab 06/20/19 0440 06/21/19 0351 06/22/19 0343 06/23/19 0315 06/24/19 0417  AST 84* 180* 90* 48*  29  ALT 74* 169* 141* 108* 87*  ALKPHOS 91 95 84 95 88  BILITOT 1.1 0.9 0.9 0.5 0.6  PROT 5.9* 6.0* 5.4* 5.7* 5.7*  ALBUMIN 2.6* 2.6* 2.5* 2.6* 2.7*   No results for input(s): LIPASE, AMYLASE in the last 168 hours. No results for input(s): AMMONIA in the last 168 hours. Coagulation Profile: No results for input(s): INR, PROTIME in the last 168 hours. Cardiac Enzymes: No results for input(s): CKTOTAL, CKMB, CKMBINDEX, TROPONINI in the last 168 hours. BNP (last 3 results) No results for input(s): PROBNP in the last 8760 hours. HbA1C: No results for input(s): HGBA1C in the last 72 hours. CBG: No results for input(s): GLUCAP in the last 168 hours. Lipid Profile: No results for input(s): CHOL, HDL, LDLCALC, TRIG, CHOLHDL, LDLDIRECT in the last 72 hours. Thyroid Function Tests: No results for input(s): TSH, T4TOTAL, FREET4, T3FREE, THYROIDAB in the last 72 hours. Anemia Panel: No results for input(s): VITAMINB12, FOLATE, FERRITIN, TIBC, IRON, RETICCTPCT in the last 72 hours. Sepsis Labs: Recent Labs   Lab 06/19/19 2155  PROCALCITON 0.10  LATICACIDVEN 1.1    Recent Results (from the past 240 hour(s))  Blood Culture (routine x 2)     Status: None   Collection Time: 06/19/19  9:55 PM   Specimen: BLOOD  Result Value Ref Range Status   Specimen Description   Final    BLOOD LEFT HAND Performed at [facility], [place] [address], [place], [place] [address]    Special Requests   Final    BOTTLES DRAWN AEROBIC AND ANAEROBIC Blood Culture adequate volume Performed at [place] [address] [place], [place] [address]    Culture   Final    NO GROWTH 5 DAYS Performed at [facility], [place] [address], [place], [place] [address]    Report Status 06/24/2019 FINAL  Final  Blood Culture (routine x 2)     Status: None   Collection Time: 06/19/19  9:55 PM   Specimen: BLOOD  Result Value Ref Range Status   Specimen Description   Final    BLOOD LEFT ANTECUBITAL Performed at [place] [address], [place], [place] [address]    Special Requests   Final    BOTTLES DRAWN AEROBIC AND ANAEROBIC Blood Culture adequate volume Performed at [facility] [address] [place], [place] [address]    Culture   Final    NO GROWTH 5 DAYS Performed at [facility], [place] [address] [place], [place] [address]    Report Status 06/24/2019 FINAL  Final      Radiology Studies: No results found.    LOS: 6 days   Time spent: More than 50% of that time was spent in counseling and/or coordination of care.  Antonieta Pert, MD [facility]  06/25/2019, 10:12 AM

## 2019-06-26 LAB — COMPREHENSIVE METABOLIC PANEL
ALT: 54 U/L — ABNORMAL HIGH (ref 0–44)
AST: 21 U/L (ref 15–41)
Albumin: 2.4 g/dL — ABNORMAL LOW (ref 3.5–5.0)
Alkaline Phosphatase: 81 U/L (ref 38–126)
Anion gap: 11 (ref 5–15)
BUN: 43 mg/dL — ABNORMAL HIGH (ref 8–23)
CO2: 25 mmol/L (ref 22–32)
Calcium: 8.9 mg/dL (ref 8.9–10.3)
Chloride: 100 mmol/L (ref 98–111)
Creatinine, Ser: 0.89 mg/dL (ref 0.44–1.00)
GFR calc Af Amer: >60 mL/min (ref >60)
GFR calc non Af Amer: 55 mL/min — ABNORMAL LOW (ref >60)
Glucose, Bld: 192 mg/dL — ABNORMAL HIGH (ref 70–99)
Potassium: 5.2 mmol/L — ABNORMAL HIGH (ref 3.5–5.1)
Sodium: 136 mmol/L (ref 135–145)
Total Bilirubin: 0.7 mg/dL (ref 0.3–1.2)
Total Protein: 5.5 g/dL — ABNORMAL LOW (ref 6.5–8.1)

## 2019-06-26 LAB — CBC
HCT: 37.8 % (ref 36.0–46.0)
Hemoglobin: 11.9 g/dL — ABNORMAL LOW (ref 12.0–15.0)
MCH: 30.3 pg (ref 26.0–34.0)
MCHC: 31.5 g/dL (ref 30.0–36.0)
MCV: 96.2 fL (ref 80.0–100.0)
Platelets: 310 10*3/uL (ref 150–400)
RBC: 3.93 MIL/uL (ref 3.87–5.11)
RDW: 13.2 % (ref 11.5–15.5)
WBC: 10.8 10*3/uL — ABNORMAL HIGH (ref 4.0–10.5)
nRBC: 0 % (ref 0.0–0.2)

## 2019-06-26 LAB — FERRITIN: Ferritin: 120 ng/mL (ref 11–307)

## 2019-06-26 LAB — MAGNESIUM: Magnesium: 1.9 mg/dL (ref 1.7–2.4)

## 2019-06-26 LAB — GLUCOSE, CAPILLARY: Glucose-Capillary: 158 mg/dL — ABNORMAL HIGH (ref 70–99)

## 2019-06-26 LAB — D-DIMER, QUANTITATIVE: D-Dimer, Quant: 0.83 ug/mL-FEU — ABNORMAL HIGH (ref 0.00–0.50)

## 2019-06-26 LAB — C-REACTIVE PROTEIN: CRP: 2.7 mg/dL — ABNORMAL HIGH (ref <1.0)

## 2019-06-26 MED ORDER — INSULIN ASPART 100 UNIT/ML ~~LOC~~ SOLN
0.0000 [IU] | Freq: Three times a day (TID) | SUBCUTANEOUS | Status: DC
  Administered 2019-06-27 (×2): 1 [IU] via SUBCUTANEOUS

## 2019-06-26 MED ORDER — RESOURCE THICKENUP CLEAR PO POWD
ORAL | Status: DC | PRN
  Filled 2019-06-26: qty 125

## 2019-06-26 MED ORDER — INSULIN ASPART 100 UNIT/ML ~~LOC~~ SOLN: 0.0000 [IU] | Freq: Every day | SUBCUTANEOUS | Status: DC

## 2019-06-26 MED ORDER — SODIUM CHLORIDE 0.9 % IV SOLN
INTRAVENOUS | Status: AC
  Administered 2019-06-26: 18:00:00 via INTRAVENOUS

## 2019-06-26 NOTE — Evaluation (Signed)
Clinical/Bedside Swallow Evaluation Patient Details  Name: [name] MRN: [id number] Date of Birth: [birth year]  Today's Date: 06/26/2019 Time: SLP Start Time (ACUTE ONLY): 1113 SLP Stop Time (ACUTE ONLY): 1200 SLP Time Calculation (min) (ACUTE ONLY): 47 min  Past Medical History:  Past Medical History:  Diagnosis Date  . Allergic rhinitis, seasonal   . Breast cancer of upper-outer quadrant of right female breast ([place]) 09/13/2014  . Chronic venous insufficiency   . Dermatophytosis of nail   . Dysrhythmia    H/o Atrial fibrillation  . Edema leg   . Family history of malignant neoplasm of breast   . Family history of malignant neoplasm of ovary   . Goiter    right thyroid nodule   . HTN (hypertension) 08/13/2017  . HX: breast cancer    bilateral  . Menopausal syndrome   . Osteoarthritis   . Osteoporosis   . Squamous cell skin cancer    right lower leg  . Wears glasses   . Wears hearing aid    both ears   Past Surgical History:  Past Surgical History:  Procedure Laterality Date  . bilateral lumpectomies for breast cancer  Aug. 2007  . BREAST LUMPECTOMY WITH RADIOACTIVE SEED LOCALIZATION Right 10/01/2014   Procedure: BREAST LUMPECTOMY WITH RADIOACTIVE SEED LOCALIZATION;  Surgeon: Erroll Luna, MD;  Location: [facility];  Service: General;  Laterality: Right;  . BREAST LUMPECTOMY WITH RADIOACTIVE SEED LOCALIZATION Left 03/22/2019   Procedure: LEFT BREAST LUMPECTOMY WITH RADIOACTIVE SEED LOCALIZATION;  Surgeon: Erroll Luna, MD;  Location: [facility];  Service: General;  Laterality: Left;  . CATARACT EXTRACTION    . CHOLECYSTECTOMY N/A 08/13/2017   Procedure: LAPAROSCOPIC CHOLECYSTECTOMY WITH INTRAOPERATIVE CHOLANGIOGRAM;  Surgeon: Excell Seltzer, MD;  Location: [facility];  Service: General;  Laterality: N/A;  . DILATION AND CURETTAGE OF UTERUS    . ESOPHAGOGASTRODUODENOSCOPY  07/26/2011   Procedure: ESOPHAGOGASTRODUODENOSCOPY (EGD);  Surgeon: Jeryl Columbia, MD;   Location: [facility];  Service: Endoscopy;  Laterality: N/A;  . history of lumbar compression fracture    . left eardum sx  1983  . left knee arthroscopic surgery    . no screening colonoscopy    . s/p bilateral lumpectomies    . SAVORY DILATION  07/26/2011   Procedure: SAVORY DILATION;  Surgeon: Jeryl Columbia, MD;  Location: [facility];  Service: Endoscopy;  Laterality: N/A;  . status post resection squamous cell cancer right lower leg    . TONSILLECTOMY    . UMBILICAL HERNIA REPAIR     HPI:  85 yo female adm to [facility] with pna.  She has h/o CAD, chronic diastolic CHF, PAF on Eliquis, history of breast cancer recent diagnosis of COVID-19 on 11/28 at urgent care center.  She continued with nausea vomiting, and husband not able to take care of her and was admitted to a skilled nursing facility for further monitoring, was in azithromycin. Per MD note, at SNF she was found to have hypoxia in mid 80s chest x-ray with bilateral opacities.  Swallow evaluation ordered due to pt coughing some with po intake.  She denies h/o dysphagia but admits to some upper esophageal difficulty since current admit.  Per chart review has stricture hx in 2016.  Communication with pt is difficult due to her hearing loss.   Assessment / Plan / Recommendation Clinical Impression  Pt with known h/o esophageal dysphagia/stricture and no focal CN deficits apparent. She does admit to some coughing with liquids prior to admission  and states swallow ability is worse currently.  Throat clearing, cough noted with po x2 of 12 boluses- more with thin liquids than nectar or solids.  To maximize comfort given pt's COVID recommend nectar thick liquids with solids and thin between meals.  Advised pt to eat small frequent meals and take small sips using teach back.  Pt advised the liquid that was minimally thicker was easier for her to swallow. Anticipate diet will be modified back to regular rapidly as pt recovers. SLP Visit Diagnosis:  Dysphagia, pharyngoesophageal phase (R13.14)    Aspiration Risk  Mild aspiration risk    Diet Recommendation Regular;Nectar-thick liquid(thin drinks ok between meals - nectar thick with meals)   Liquid Administration via: Cup;Straw Medication Administration: Whole meds with puree Supervision: Patient able to self feed Compensations: Slow rate;Small sips/bites Postural Changes: Seated upright at 90 degrees;Remain upright for at least 30 minutes after po intake    Other  Recommendations Oral Care Recommendations: Oral care BID Other Recommendations: Order thickener from pharmacy   Follow up Recommendations        Frequency and Duration min 1 x/week  1 week       Prognosis Prognosis for Safe Diet Advancement: Fair      Swallow Study   General Date of Onset: 06/26/19 HPI: 85 yo female adm to [facility] with pna.  She has h/o CAD, chronic diastolic CHF, PAF on Eliquis, history of breast cancer recent diagnosis of COVID-19 on 11/28 at urgent care center.  She continued with nausea vomiting, and husband not able to take care of her and was admitted to a skilled nursing facility for further monitoring, was in azithromycin. Per MD note, at SNF she was found to have hypoxia in mid 80s chest x-ray with bilateral opacities.  Swallow evaluation ordered due to pt coughing some with po intake.  She denies h/o dysphagia but admits to some upper esophageal difficulty since current admit.  Per chart review has stricture hx in 2016.  Communication with pt is difficult due to her hearing loss. Type of Study: Bedside Swallow Evaluation Previous Swallow Assessment: none Diet Prior to this Study: Regular;Thin liquids Temperature Spikes Noted: No Respiratory Status: Nasal cannula History of Recent Intubation: No Behavior/Cognition: Alert;Cooperative;Pleasant mood Oral Cavity Assessment: Within Functional Limits Oral Care Completed by SLP: No Oral Cavity - Dentition: Adequate natural dentition Vision:  Functional for self-feeding Self-Feeding Abilities: Able to feed self Patient Positioning: Upright in bed Baseline Vocal Quality: Normal Volitional Cough: Strong Volitional Swallow: Able to elicit    Oral/Motor/Sensory Function Overall Oral Motor/Sensory Function: Within functional limits   Ice Chips Ice chips: Not tested   Thin Liquid Thin Liquid: Impaired Presentation: Cup;Straw;Self Fed Pharyngeal  Phase Impairments: Cough - Delayed;Throat Clearing - Immediate    Nectar Thick Nectar Thick Liquid: Within functional limits   Honey Thick Honey Thick Liquid: Not tested   Puree Puree: Within functional limits Presentation: Self Fed;Spoon   Solid     Solid: Within functional limits Presentation: Self Fed Other Comments: slow mastication but effective clearance      Macario Golds 06/26/2019,12:07 PM  Luanna Salk, [facility] SLP [place] Pager [phone] Office [phone]

## 2019-06-26 NOTE — Progress Notes (Addendum)
PROGRESS NOTE  ADELLE DEWIRE  [id number] DOB: 1921-12-04 DOA: 06/19/2019 PCP: Erline Hau, MD   Brief Narrative: 85 y.o.  female with CAD, chronic diastolic CHF, PAF on Eliquis, history of breast cancer recent diagnosis of COVID-19 on 11/28 at urgent care center and discharged back to her apartment where she lives with her husband who is also Covid positive.  Patient continued nausea vomiting, and husband not able to take care of her and was admitted to a skilled nursing facility for further monitoring, was in azithromycin. At SNF she was found to have hypoxia in mid 80s chest x-ray with bilateral opacities and sent to the ER.   In [id number], saturating mid 80s on room air, tachypneic to 33, and with stable blood pressure.  EKG features a sinus rhythm with LVH.  Chest x-ray is concerning for patchy bilateral airspace disease.   LABS-albumin 2.9, AST 80, and ALT 68.  CBC is notable for mild normocytic anemia.  Lactic acid NL, Blood cultures sent, placed on Decadron, supplemental oxygen and was admitted .  Rapid Covid is +   Patient was admitted continued on supplemental oxygen,Remdesever and Decadron as per protocol.  Subjective:  Seen and examined this morning.  Was feeling weak.  Was on 2 L nasal cannula saturating 92%.   Afebrile overnight.  Respiration lower 20s. appears weak deconditioned and frail.  Assessment & Plan:   Pneumonia due to COVID-19 virus: Slowly improving, on 2l [place], saturating 93%-Inflammatory markers are stable/improving CRP 18.5->19.3->7.8->,ferritin 219 nl.D dimer 1.33->->0.9. procalcitonin stable at 0.1. LFts improving. Continue on Decadron 6 mg, completed remdesivir x5 .Continue to monitor inflammatory markers.  If unable to wean off oxygen will likely need to discharge to oxygen at 2 L, discussed with the social Development worker, community today-who is following up w insurance. Recent Labs    06/24/19 0417 06/26/19 0302  DDIMER 0.85* 0.83*  FERRITIN  --   120  CRP 7.8* 2.7*   Acute hypoxic respiratory failure due to #1: Still needing 2 L nasal cannula, continue to wean as tolerated.  Will likely need to go with home oxygen.    Hyperglycemia: check Hemoglobin A1c.  Likely from steroid,add sliding scale insulin  Coughing with drinking, requested speech eval. appreciated improved speech advised "regular;Nectar-thick liquid(thin drinks ok between meals - nectar thick with meals"  Nausea and vomiting-resolved.  No issues.  BUN has trended up to 43 from 16. I will add on gentle IV fluids overnight repeat labs in the morning.  It could likely be also from a steroid.   Mild hyperkalemia add gentle IV fluids repeat labs in the morning.  Metabolic acidosis: Improved. Encourage oral intake.    Abnormal LFTs LFTs subsequently has improved ALT down to 54.    PAF:In normal sinus rhythm,on Eliquis and metoprolol  [id number] chest pain. Continue her Lipitor,Eliquis.  Chronic diastolic heart failure, mild lower extremity edema.  Overall euvolemic.  Lasix was recently stopped. Continue her beta-blocker as tolerated.  Hypokalemia: resolved. Stop kcl  Body mass index is 28.31 kg/m.   DVT prophylaxis:Eliquis Code Status:Full Family Communication:Plan of care discussed with patient at bedside.I had called and updated her family- spouse Niece's no (her home no [id number], cell is- [id number]. Disposition Plan:Remains inpatient pending clinical improvement hypoxia,COVID-19 pneumonia. Cont patient eval obtain and advised SNF.  I had updated son in law-agreeable to return to friend's home at [facility]- in SNF part once stable.Anticipate discharge in next 1 to 2 days.   Consultants: none Procedures:none Microbiology:  covid 19 +  Antimicrobials: Anti-infectives (From admission, onward)   Start     Dose/Rate Route Frequency Ordered Stop   06/23/19 1000  remdesivir 100 mg in sodium chloride 0.9 % 100 mL IVPB     100 mg 200 mL/hr over 30 Minutes Intravenous  Daily 06/23/19 0657 06/23/19 1114   06/21/19 1000  remdesivir 100 mg in sodium chloride 0.9 % 250 mL IVPB  Status:  Discontinued     100 mg 500 mL/hr over 30 Minutes Intravenous Every 24 hours 06/20/19 0002 06/20/19 0827   06/21/19 1000  remdesivir 100 mg in sodium chloride 0.9 % 250 mL IVPB  Status:  Discontinued     100 mg 500 mL/hr over 30 Minutes Intravenous Daily 06/20/19 0829 06/23/19 0657   06/20/19 1600  remdesivir 100 mg in sodium chloride 0.9 % 250 mL IVPB     100 mg 500 mL/hr over 30 Minutes Intravenous  Once 06/20/19 0829 06/21/19 0058   06/20/19 0015  remdesivir 200 mg in sodium chloride 0.9 % 250 mL IVPB     200 mg 500 mL/hr over 30 Minutes Intravenous Once 06/20/19 0002 06/20/19 0122       Objective: Vitals:   06/25/19 1200 06/25/19 2048 06/26/19 0506 06/26/19 1158  BP: (!) 141/56 (!) 151/51 (!) 143/59 (!) 151/53  Pulse: 74 75 67 74  Resp: (!) 23 20 20    Temp: 98.2 F (36.8 C) 98.3 F (36.8 C) 97.7 F (36.5 C) (!) 97.5 F (36.4 C)  TempSrc: Oral Oral Oral   SpO2: 96% 95% 93% 92%  Weight:      Height:        Intake/Output Summary (Last 24 hours) at 06/26/2019 1718 Last data filed at 06/26/2019 0834 Gross per 24 hour  Intake 280 ml  Output 752 ml  Net -472 ml   Filed Weights   06/20/19 0132  Weight: 72.5 kg   Weight change:   Body mass index is 28.31 kg/m.  Intake/Output from previous day: 12/07 0701 - 12/08 0700 In: 480 [P.O.:480] Out: 752 [Urine:752] Intake/Output this shift: Total I/O In: 160 [P.O.:160] Out: -   Examination:  General exam: Alert awake, oriented, hard of hearing, thin and frail elderly.   HEENT:Oral mucosa dry, Ear/Nose WNL grossly normal no icterus Respiratory system: B/l clear breath sounds no wheezing or crackles,no use of accessory muscles Cardiovascular system: S1 & S2 +,No JVD. Gastrointestinal system: Abdomen soft, NT,ND, BS+ Nervous System:Alert, awake, moving extremities and grossly nonfocal Extremities: no  edema, distal peripheral pulses palpable.  Skin: No rashes,no icterus. MSK: Normal muscle bulk,tone, power  Medications:  Scheduled Meds: . apixaban  5 mg Oral BID  . atorvastatin  40 mg Oral q1800  . dexamethasone (DECADRON) injection  6 mg Intravenous Q24H  . mouth rinse  15 mL Mouth Rinse BID  . metoprolol succinate  25 mg Oral QHS  . montelukast  10 mg Oral QHS   Continuous Infusions: . sodium chloride 250 mL (06/23/19 1043)    Data Reviewed: I have personally reviewed following labs and imaging studies  CBC: Recent Labs  Lab 06/20/19 0440 06/21/19 0351 06/22/19 0343 06/23/19 0315 06/24/19 0417 06/26/19 0302  WBC 5.5 6.6 9.9 9.0 9.5 10.8*  NEUTROABS 5.1 6.0 9.1* 8.4* 8.8*  --   HGB 11.1* 11.8* 11.2* 11.2* 11.4* 11.9*  HCT 35.3* 36.9 34.0* 36.4 36.7 37.8  MCV 95.4 93.4 91.4 97.3 95.8 96.2  PLT 151 185 216 250 256 [id number]   Basic Metabolic Panel:  Recent Labs  Lab 06/20/19 0440 06/21/19 0351 06/22/19 0343 06/23/19 0315 06/24/19 0417 06/26/19 0302  NA 139 139 140 142 142 136  K 3.8 3.4* 4.1 4.5 4.6 5.2*  CL 107 106 108 108 106 100  CO2 20* 22 21* 22 25 25   GLUCOSE 173* 180* 189* 206* 240* 192*  BUN 16 27* 35* 36* 36* 43*  CREATININE 0.88 0.82 0.90 0.95 0.97 0.89  CALCIUM 7.8* 8.3* 8.2* 8.6* 9.0 8.9  MG 1.9 2.0 2.0 2.2 2.1 1.9  PHOS 2.6 2.5 2.8 2.9 3.4  --    GFR: Estimated Creatinine Clearance: 36.1 mL/min (by C-G formula based on SCr of 0.89 mg/dL). Liver Function Tests: Recent Labs  Lab 06/21/19 0351 06/22/19 0343 06/23/19 0315 06/24/19 0417 06/26/19 0302  AST 180* 90* 48* 29 21  ALT 169* 141* 108* 87* 54*  ALKPHOS 95 84 95 88 81  BILITOT 0.9 0.9 0.5 0.6 0.7  PROT 6.0* 5.4* 5.7* 5.7* 5.5*  ALBUMIN 2.6* 2.5* 2.6* 2.7* 2.4*   No results for input(s): LIPASE, AMYLASE in the last 168 hours. No results for input(s): AMMONIA in the last 168 hours. Coagulation Profile: No results for input(s): INR, PROTIME in the last 168 hours. Cardiac Enzymes: No  results for input(s): CKTOTAL, CKMB, CKMBINDEX, TROPONINI in the last 168 hours. BNP (last 3 results) No results for input(s): PROBNP in the last 8760 hours. HbA1C: No results for input(s): HGBA1C in the last 72 hours. CBG: No results for input(s): GLUCAP in the last 168 hours. Lipid Profile: No results for input(s): CHOL, HDL, LDLCALC, TRIG, CHOLHDL, LDLDIRECT in the last 72 hours. Thyroid Function Tests: No results for input(s): TSH, T4TOTAL, FREET4, T3FREE, THYROIDAB in the last 72 hours. Anemia Panel: Recent Labs    06/26/19 0302  FERRITIN 120   Sepsis Labs: Recent Labs  Lab 06/19/19 2155  PROCALCITON 0.10  LATICACIDVEN 1.1    Recent Results (from the past 240 hour(s))  Blood Culture (routine x 2)     Status: None   Collection Time: 06/19/19  9:55 PM   Specimen: BLOOD  Result Value Ref Range Status   Specimen Description   Final    BLOOD LEFT HAND Performed at [facility] [address], [place], [place] [address]    Special Requests   Final    BOTTLES DRAWN AEROBIC AND ANAEROBIC Blood Culture adequate volume Performed at [facility] [address] [place], [place] [address]    Culture   Final    NO GROWTH 5 DAYS Performed at [facility], [place] [address], [place], [place] [address]    Report Status 06/24/2019 FINAL  Final  Blood Culture (routine x 2)     Status: None   Collection Time: 06/19/19  9:55 PM   Specimen: BLOOD  Result Value Ref Range Status   Specimen Description   Final    BLOOD LEFT ANTECUBITAL Performed at [facility] [address], [place], [place] [address]    Special Requests   Final    BOTTLES DRAWN AEROBIC AND ANAEROBIC Blood Culture adequate volume Performed at [facility] [address], [place], [place] [address]    Culture   Final    NO GROWTH 5 DAYS Performed at [facility], [place] [address] [place],  [address]    Report Status  06/24/2019 FINAL  Final      Radiology Studies: No results found.    LOS: 7  days   Time spent: More than 50% of that time was spent in counseling and/or coordination of care.  Antonieta Pert, MD [facility]  06/26/2019, 5:18 PM

## 2019-06-27 DIAGNOSIS — D72829 Elevated white blood cell count, unspecified: Secondary | ICD-10-CM | POA: Diagnosis not present

## 2019-06-27 DIAGNOSIS — E875 Hyperkalemia: Secondary | ICD-10-CM | POA: Diagnosis not present

## 2019-06-27 DIAGNOSIS — I10 Essential (primary) hypertension: Secondary | ICD-10-CM | POA: Diagnosis not present

## 2019-06-27 DIAGNOSIS — I5033 Acute on chronic diastolic (congestive) heart failure: Secondary | ICD-10-CM | POA: Diagnosis not present

## 2019-06-27 DIAGNOSIS — I4891 Unspecified atrial fibrillation: Secondary | ICD-10-CM | POA: Diagnosis not present

## 2019-06-27 DIAGNOSIS — D649 Anemia, unspecified: Secondary | ICD-10-CM | POA: Diagnosis not present

## 2019-06-27 DIAGNOSIS — R112 Nausea with vomiting, unspecified: Secondary | ICD-10-CM | POA: Diagnosis not present

## 2019-06-27 DIAGNOSIS — R600 Localized edema: Secondary | ICD-10-CM | POA: Diagnosis not present

## 2019-06-27 DIAGNOSIS — I214 Non-ST elevation (NSTEMI) myocardial infarction: Secondary | ICD-10-CM | POA: Diagnosis not present

## 2019-06-27 DIAGNOSIS — Z20828 Contact with and (suspected) exposure to other viral communicable diseases: Secondary | ICD-10-CM | POA: Diagnosis not present

## 2019-06-27 DIAGNOSIS — U071 COVID-19: Secondary | ICD-10-CM | POA: Diagnosis not present

## 2019-06-27 DIAGNOSIS — I5032 Chronic diastolic (congestive) heart failure: Secondary | ICD-10-CM | POA: Diagnosis not present

## 2019-06-27 DIAGNOSIS — E118 Type 2 diabetes mellitus with unspecified complications: Secondary | ICD-10-CM | POA: Diagnosis not present

## 2019-06-27 DIAGNOSIS — Z7401 Bed confinement status: Secondary | ICD-10-CM | POA: Diagnosis not present

## 2019-06-27 DIAGNOSIS — I480 Paroxysmal atrial fibrillation: Secondary | ICD-10-CM | POA: Diagnosis not present

## 2019-06-27 DIAGNOSIS — R29898 Other symptoms and signs involving the musculoskeletal system: Secondary | ICD-10-CM | POA: Diagnosis not present

## 2019-06-27 DIAGNOSIS — J9601 Acute respiratory failure with hypoxia: Secondary | ICD-10-CM

## 2019-06-27 DIAGNOSIS — I499 Cardiac arrhythmia, unspecified: Secondary | ICD-10-CM | POA: Diagnosis not present

## 2019-06-27 DIAGNOSIS — M2550 Pain in unspecified joint: Secondary | ICD-10-CM | POA: Diagnosis not present

## 2019-06-27 DIAGNOSIS — J189 Pneumonia, unspecified organism: Secondary | ICD-10-CM | POA: Diagnosis not present

## 2019-06-27 DIAGNOSIS — I959 Hypotension, unspecified: Secondary | ICD-10-CM | POA: Diagnosis not present

## 2019-06-27 DIAGNOSIS — D510 Vitamin B12 deficiency anemia due to intrinsic factor deficiency: Secondary | ICD-10-CM | POA: Diagnosis not present

## 2019-06-27 DIAGNOSIS — E782 Mixed hyperlipidemia: Secondary | ICD-10-CM | POA: Diagnosis not present

## 2019-06-27 DIAGNOSIS — R2681 Unsteadiness on feet: Secondary | ICD-10-CM | POA: Diagnosis not present

## 2019-06-27 DIAGNOSIS — I5030 Unspecified diastolic (congestive) heart failure: Secondary | ICD-10-CM | POA: Diagnosis not present

## 2019-06-27 DIAGNOSIS — R1310 Dysphagia, unspecified: Secondary | ICD-10-CM | POA: Diagnosis not present

## 2019-06-27 DIAGNOSIS — I25119 Atherosclerotic heart disease of native coronary artery with unspecified angina pectoris: Secondary | ICD-10-CM | POA: Diagnosis not present

## 2019-06-27 DIAGNOSIS — R718 Other abnormality of red blood cells: Secondary | ICD-10-CM | POA: Diagnosis not present

## 2019-06-27 DIAGNOSIS — R05 Cough: Secondary | ICD-10-CM | POA: Diagnosis not present

## 2019-06-27 DIAGNOSIS — D529 Folate deficiency anemia, unspecified: Secondary | ICD-10-CM | POA: Diagnosis not present

## 2019-06-27 DIAGNOSIS — Z79899 Other long term (current) drug therapy: Secondary | ICD-10-CM | POA: Diagnosis not present

## 2019-06-27 DIAGNOSIS — M6281 Muscle weakness (generalized): Secondary | ICD-10-CM | POA: Diagnosis not present

## 2019-06-27 DIAGNOSIS — J1289 Other viral pneumonia: Secondary | ICD-10-CM | POA: Diagnosis not present

## 2019-06-27 DIAGNOSIS — R509 Fever, unspecified: Secondary | ICD-10-CM | POA: Diagnosis not present

## 2019-06-27 LAB — COMPREHENSIVE METABOLIC PANEL
ALT: 51 U/L — ABNORMAL HIGH (ref 0–44)
AST: 27 U/L (ref 15–41)
Albumin: 2.3 g/dL — ABNORMAL LOW (ref 3.5–5.0)
Alkaline Phosphatase: 78 U/L (ref 38–126)
Anion gap: 10 (ref 5–15)
BUN: 46 mg/dL — ABNORMAL HIGH (ref 8–23)
CO2: 25 mmol/L (ref 22–32)
Calcium: 8.8 mg/dL — ABNORMAL LOW (ref 8.9–10.3)
Chloride: 100 mmol/L (ref 98–111)
Creatinine, Ser: 0.93 mg/dL (ref 0.44–1.00)
GFR calc Af Amer: >60 mL/min (ref >60)
GFR calc non Af Amer: 52 mL/min — ABNORMAL LOW (ref >60)
Glucose, Bld: 198 mg/dL — ABNORMAL HIGH (ref 70–99)
Potassium: 5.2 mmol/L — ABNORMAL HIGH (ref 3.5–5.1)
Sodium: 135 mmol/L (ref 135–145)
Total Bilirubin: 0.5 mg/dL (ref 0.3–1.2)
Total Protein: 5.3 g/dL — ABNORMAL LOW (ref 6.5–8.1)

## 2019-06-27 LAB — CBC
HCT: 36.4 % (ref 36.0–46.0)
Hemoglobin: 12 g/dL (ref 12.0–15.0)
MCH: 30.4 pg (ref 26.0–34.0)
MCHC: 33 g/dL (ref 30.0–36.0)
MCV: 92.2 fL (ref 80.0–100.0)
Platelets: 181 10*3/uL (ref 150–400)
RBC: 3.95 MIL/uL (ref 3.87–5.11)
RDW: 13.3 % (ref 11.5–15.5)
WBC: 9 10*3/uL (ref 4.0–10.5)
nRBC: 0 % (ref 0.0–0.2)

## 2019-06-27 LAB — HEMOGLOBIN A1C
Hgb A1c MFr Bld: 6.5 % — ABNORMAL HIGH (ref 4.8–5.6)
Mean Plasma Glucose: 139.85 mg/dL

## 2019-06-27 LAB — GLUCOSE, CAPILLARY
Glucose-Capillary: 146 mg/dL — ABNORMAL HIGH (ref 70–99)
Glucose-Capillary: 137 mg/dL — ABNORMAL HIGH (ref 70–99)
Glucose-Capillary: 117 mg/dL — ABNORMAL HIGH (ref 70–99)

## 2019-06-27 LAB — C-REACTIVE PROTEIN: CRP: 1.3 mg/dL — ABNORMAL HIGH (ref <1.0)

## 2019-06-27 LAB — D-DIMER, QUANTITATIVE: D-Dimer, Quant: 0.79 ug/mL-FEU — ABNORMAL HIGH (ref 0.00–0.50)

## 2019-06-27 LAB — FERRITIN: Ferritin: 113 ng/mL (ref 11–307)

## 2019-06-27 MED ORDER — SODIUM ZIRCONIUM CYCLOSILICATE 10 G PO PACK
10.0000 g | PACK | Freq: Once | ORAL | Status: AC
  Administered 2019-06-27: 10 g via ORAL
  Filled 2019-06-27: qty 1

## 2019-06-27 MED ORDER — DEXAMETHASONE 6 MG PO TABS: 6.0000 mg | ORAL_TABLET | Freq: Two times a day (BID) | ORAL | 0 refills | Status: DC

## 2019-06-27 NOTE — Progress Notes (Signed)
Patient discharging to [facility] - report called and given to [place] at facility. IV removed - WNL. AVS placed in packet.  Patients husband aware per CM.  Awaiting arrival of [facility] for transport.  Patient in NAD at this time.

## 2019-06-27 NOTE — Discharge Instructions (Signed)
Person Under Monitoring Name: [name]  Location: [id number] [name] [address]   Infection Prevention Recommendations for Individuals Confirmed to have, or Being Evaluated for, 2019 Novel Coronavirus (COVID-19) Infection Who Receive Care at Home  Individuals who are confirmed to have, or are being evaluated for, COVID-19 should follow the prevention steps below until a healthcare provider or local or state health department says they can return to normal activities.  Stay home except to get medical care You should restrict activities outside your home, except for getting medical care. Do not go to work, school, or public areas, and do not use public transportation or taxis.  Call ahead before visiting your doctor Before your medical appointment, call the healthcare provider and tell them that you have, or are being evaluated for, COVID-19 infection. This will help the healthcare providers office take steps to keep other people from getting infected. Ask your healthcare provider to call the local or state health department.  Monitor your symptoms Seek prompt medical attention if your illness is worsening (e.g., difficulty breathing). Before going to your medical appointment, call the healthcare provider and tell them that you have, or are being evaluated for, COVID-19 infection. Ask your healthcare provider to call the local or state health department.  Wear a facemask You should wear a facemask that covers your nose and mouth when you are in the same room with other people and when you visit a healthcare provider. People who live with or visit you should also wear a facemask while they are in the same room with you.  Separate yourself from other people in your home As much as possible, you should stay in a different room from other people in your home. Also, you should use a separate bathroom, if available.  Avoid sharing household items You should  not share dishes, drinking glasses, cups, eating utensils, towels, bedding, or other items with other people in your home. After using these items, you should wash them thoroughly with soap and water.  Cover your coughs and sneezes Cover your mouth and nose with a tissue when you cough or sneeze, or you can cough or sneeze into your sleeve. Throw used tissues in a lined trash can, and immediately wash your hands with soap and water for at least 20 seconds or use an alcohol-based hand rub.  Wash your [facility] your hands often and thoroughly with soap and water for at least 20 seconds. You can use an alcohol-based hand sanitizer if soap and water are not available and if your hands are not visibly dirty. Avoid touching your eyes, nose, and mouth with unwashed hands.   Prevention Steps for Caregivers and Household Members of Individuals Confirmed to have, or Being Evaluated for, COVID-19 Infection Being Cared for in the Home  If you live with, or provide care at home for, a person confirmed to have, or being evaluated for, COVID-19 infection please follow these guidelines to prevent infection:  Follow healthcare providers instructions Make sure that you understand and can help the patient follow any healthcare provider instructions for all care.  Provide for the patients basic needs You should help the patient with basic needs in the home and provide support for getting groceries, prescriptions, and other personal needs.  Monitor the patients symptoms If they are getting sicker, call his or her medical provider and tell them that the patient has, or is being evaluated for, COVID-19 infection. This will help the healthcare  providers office take steps to keep other people from getting infected. Ask the healthcare provider to call the local or state health department.  Limit the number of people who have contact with the patient  If possible, have only one caregiver for the  patient.  Other household members should stay in another home or place of residence. If this is not possible, they should stay  in another room, or be separated from the patient as much as possible. Use a separate bathroom, if available.  Restrict visitors who do not have an essential need to be in the home.  Keep older adults, very young children, and other sick people away from the patient Keep older adults, very young children, and those who have compromised immune systems or chronic health conditions away from the patient. This includes people with chronic heart, lung, or kidney conditions, diabetes, and cancer.  Ensure good ventilation Make sure that shared spaces in the home have good air flow, such as from an air conditioner or an opened window, weather permitting.  Wash your hands often  Wash your hands often and thoroughly with soap and water for at least 20 seconds. You can use an alcohol based hand sanitizer if soap and water are not available and if your hands are not visibly dirty.  Avoid touching your eyes, nose, and mouth with unwashed hands.  Use disposable paper towels to dry your hands. If not available, use dedicated cloth towels and replace them when they become wet.  Wear a facemask and gloves  Wear a disposable facemask at all times in the room and gloves when you touch or have contact with the patients blood, body fluids, and/or secretions or excretions, such as sweat, saliva, sputum, nasal mucus, vomit, urine, or feces.  Ensure the mask fits over your nose and mouth tightly, and do not touch it during use.  Throw out disposable facemasks and gloves after using them. Do not reuse.  Wash your hands immediately after removing your facemask and gloves.  If your personal clothing becomes contaminated, carefully remove clothing and launder. Wash your hands after handling contaminated clothing.  Place all used disposable facemasks, gloves, and other waste in a lined  container before disposing them with other household waste.  Remove gloves and wash your hands immediately after handling these items.  Do not share dishes, glasses, or other household items with the patient  Avoid sharing household items. You should not share dishes, drinking glasses, cups, eating utensils, towels, bedding, or other items with a patient who is confirmed to have, or being evaluated for, COVID-19 infection.  After the person uses these items, you should wash them thoroughly with soap and water.  Wash laundry thoroughly  Immediately remove and wash clothes or bedding that have blood, body fluids, and/or secretions or excretions, such as sweat, saliva, sputum, nasal mucus, vomit, urine, or feces, on them.  Wear gloves when handling laundry from the patient.  Read and follow directions on labels of laundry or clothing items and detergent. In general, wash and dry with the warmest temperatures recommended on the label.  Clean all areas the individual has used often  Clean all touchable surfaces, such as counters, tabletops, doorknobs, bathroom fixtures, toilets, phones, keyboards, tablets, and bedside tables, every day. Also, clean any surfaces that may have blood, body fluids, and/or secretions or excretions on them.  Wear gloves when cleaning surfaces the patient has come in contact with.  Use a diluted bleach solution (e.g., dilute bleach with  1 part bleach and 10 parts water) or a household disinfectant with a label that says EPA-registered for coronaviruses. To make a bleach solution at home, add 1 tablespoon of bleach to 1 quart (4 cups) of water. For a larger supply, add  cup of bleach to 1 gallon (16 cups) of water.  Read labels of cleaning products and follow recommendations provided on product labels. Labels contain instructions for safe and effective use of the cleaning product including precautions you should take when applying the product, such as wearing gloves or  eye protection and making sure you have good ventilation during use of the product.  Remove gloves and wash hands immediately after cleaning.  Monitor yourself for signs and symptoms of illness Caregivers and household members are considered close contacts, should monitor their health, and will be asked to limit movement outside of the home to the extent possible. Follow the monitoring steps for close contacts listed on the symptom monitoring form.   ? If you have additional questions, contact your local health department or call the epidemiologist on call at [phone] (available 24/7). ? This guidance is subject to change. For the most up-to-date guidance from [facility], please refer to their website: [email or web address]

## 2019-06-27 NOTE — TOC Progression Note (Signed)
Transition of Care [facility]) - Progression Note    Patient Details  Name: [name] MRN: [id number] Date of Birth: 1922-07-08  [facility] of [facility] CM/SW Contact  Purcell Mouton, RN Phone Number: 06/27/2019, 3:11 PM  Clinical Narrative:     Pt's husband is aware of discharge to [facility].   Expected Discharge Plan: [place] Barriers to Discharge: No Barriers Identified  Expected Discharge Plan and Services Expected Discharge Plan: [place]   Discharge Planning Services: CM Consult   Living arrangements for the past 2 months: [place] Expected Discharge Date: 06/27/19                                     Social Determinants of Health (SDOH) Interventions    Readmission Risk Interventions No flowsheet data found.

## 2019-06-27 NOTE — Discharge Summary (Signed)
Physician Discharge Summary  [name] [id number] DOB: 04-03-1922 DOA: 06/19/2019  PCP: Isaac Bliss, Rayford Halsted, MD  Admit date: 06/19/2019 Discharge date: 06/27/2019  Admitted From: SNF Disposition: SNF  Recommendations for Outpatient Follow-up:  1. Follow ups as below. 2. Please obtain CBC/CMP/Mag at follow up 3. Please follow up on the following pending results: None  Home Health: Not applicable Equipment/Devices: Rolling walker with 5 inch wheels  Discharge Condition: Stable CODE STATUS: Full code  Contact information for after-discharge care    Destination    [facility] .   Service: Skilled Nursing Contact information: [place] Hospital Course: 85 y.o. female with CAD, chronic diastolic CHF, PAF on Eliquis, history of breast cancer recent diagnosis of COVID-19 on 11/28 at urgent care center and discharged back to her apartment where she lives with her husband who is also Covid positive.  Patient continued nausea vomiting, and husband not able to take care of her and was admitted to a skilled nursing facility for further monitoring, was in azithromycin. At SNF she was found to have hypoxia in mid 80s chest x-ray with bilateral opacities and sent to the ER.   In [id number], saturating mid 80s on room air, tachypneic to 33, and with stable blood pressure. EKG features a sinus rhythm with LVH. Chest x-ray is concerning for patchy bilateral airspace disease.  LABS-albumin 2.9, AST 80, and ALT 68. CBC is notable for mild normocytic anemia. Lactic acid NL, Blood cultures sent, placed on Decadron, supplemental oxygen and was admitted .  Rapid Covid is positive.  Patient was admitted continued on supplemental oxygen, Remdesever and Decadron as per protocol.  She completed 5 days course of remdesivir and 9 days of Decadron.  Maintained appropriate saturation on room air  prior to discharge.  Inflammatory markers improved tremendously.   Evaluated by PT/OT who recommended SNF.  See individual problem list below for more on hospital course.  Discharge Diagnoses:  Pneumonia due to COVID-19 virus:  Resolving.  Currently saturating in 90s on room air.  No respiratory complaints or discharge.  Inflammatory markers down trended significantly.  LFT basically resolved. -Completed 5 days of remdesivir and 9 days of Decadron. Recent Labs    06/26/19 0302 06/27/19 0346  DDIMER 0.83* 0.79*  FERRITIN 120 113  CRP 2.7* 1.3*    Acute hypoxic respiratory failure due to #1:  Resolved.  Saturating in 90s on room air.   -Treatment as above.  New onset controlled diabetes with hyperglycemia: A1c 6.5%.  Hyperglycemia likely due to steroid.  Anticipate improvement once she is off steroid.  Dysphagia/cough with drinking: Appreciate SLP recommendation: "regular;Nectar-thick liquid(thin drinks ok between meals - nectar thick with meals"  -Recommend ongoing speech therapy at SNF.  Nausea and vomiting-resolved.  Likely due to the above.  Mild hyperkalemia: Give Lokelma 10 g -Recheck CMP n 1 week.  Metabolic acidosis:  Resolved.  Elevated LFT: Likely due to Covid infection.  Resolving. -Recheck CMP in 1 week.  Up  Paroxysmal A. Fib: NSR -Continue home Eliquis and metoprolol.  CAD: Stable.  No anginal symptoms.   -Continue statin and Eliquis  Chronic diastolic CHF: Stable.   Reportedly not taking Lasix anymore. -Assess fluid status -May consider starting Lasix as appropriate  Overweight: BMI 28.31.   -Outpatient follow-up     Discharge Instructions  Discharge Instructions    Diet general  Complete by: As directed    Increase activity slowly   Complete by: As directed    MyChart COVID-19 home monitoring program   Complete by: Jun 27, 2019    Is the patient willing to use the [place] for home monitoring?: Yes     Allergies as of  06/27/2019      Reactions   Celecoxib Swelling   Swelling-leg      Medication List    STOP taking these medications   azithromycin 250 MG tablet Commonly known as: ZITHROMAX   furosemide 20 MG tablet Commonly known as: LASIX     TAKE these medications   acetaminophen 325 MG tablet Commonly known as: TYLENOL Take 2 tablets (650 mg total) by mouth every 6 (six) hours as needed for mild pain (or Fever >/= 101).   apixaban 5 MG Tabs tablet Commonly known as: ELIQUIS Take 1 tablet (5 mg total) by mouth 2 (two) times daily.   atorvastatin 40 MG tablet Commonly known as: LIPITOR TAKE 1 TABLET BY MOUTH EVERY DAY   ipratropium 0.06 % nasal spray Commonly known as: ATROVENT PLACE 2 SPRAYS INTO BOTH NOSTRILS 4 (FOUR) TIMES DAILY.   metoprolol succinate 25 MG 24 hr tablet Commonly known as: TOPROL-XL Take 25 mg by mouth at bedtime.   montelukast 10 MG tablet Commonly known as: SINGULAIR TAKE 1 TABLET BY MOUTH EVERYDAY AT BEDTIME What changed: See the new instructions.   PreserVision AREDS 2 Caps Take 1 capsule by mouth 2 (two) times daily.   SYSTANE OP Place 1 drop into both eyes 2 (two) times daily as needed (dry eyes).       Consultations:  None  Procedures/Studies:  2D Echo on none   Dg Chest Port 1 View  Result Date: 06/19/2019 CLINICAL DATA:  COVID, cough EXAM: PORTABLE CHEST 1 VIEW COMPARISON:  08/29/2017 FINDINGS: Patchy bilateral airspace opacities are noted. Heart is borderline in size. No visible significant effusions or acute bony abnormality. IMPRESSION: Patchy bilateral airspace disease concerning for pneumonia. Electronically Signed   By: Rolm Baptise M.D.   On: 06/19/2019 22:08      Discharge Exam: Vitals:   06/27/19 0836 06/27/19 1213  BP:  (!) 141/60  Pulse:  81  Resp:  20  Temp:  98.4 F (36.9 C)  SpO2: 92% 92%    GENERAL: No acute distress.  Appears well.  HEENT: MMM.  Vision and hearing grossly intact.  NECK: Supple.  No apparent  JVD.  RESP:  No IWOB.  Fair air movement bilaterally. CVS:  RRR. Heart sounds normal.  ABD/GI/GU: Bowel sounds present. Soft. Non tender.  MSK/EXT:  Moves extremities. No apparent deformity or edema.  SKIN: no apparent skin lesion or wound NEURO: Awake, alert and oriented appropriately.  No apparent focal neuro deficit. PSYCH: Calm. Normal affect.   The results of significant diagnostics from this hospitalization (including imaging, microbiology, ancillary and laboratory) are listed below for reference.     Microbiology: Recent Results (from the past 240 hour(s))  Blood Culture (routine x 2)     Status: None   Collection Time: 06/19/19  9:55 PM   Specimen: BLOOD  Result Value Ref Range Status   Specimen Description   Final    BLOOD LEFT HAND Performed at [place] [address] [place], [place] [address]    Special Requests   Final    BOTTLES DRAWN AEROBIC AND ANAEROBIC Blood Culture adequate volume Performed at [facility],  [place] [address], [place], [place] [address]    Culture   Final    NO GROWTH 5 DAYS Performed at [facility], [place] [address], [place], [place] [address]    Report Status 06/24/2019 FINAL  Final  Blood Culture (routine x 2)     Status: None   Collection Time: 06/19/19  9:55 PM   Specimen: BLOOD  Result Value Ref Range Status   Specimen Description   Final    BLOOD LEFT ANTECUBITAL Performed at [place] [address], [place], [place] [address]    Special Requests   Final    BOTTLES DRAWN AEROBIC AND ANAEROBIC Blood Culture adequate volume Performed at [place] [address], [place], [place] [address]    Culture   Final    NO GROWTH 5 DAYS Performed at [facility], [place] [address] [place], [place] [address]    Report Status 06/24/2019 FINAL  Final     Labs: BNP (last 3 results) No results for input(s): BNP in the last 8760 hours. Basic  Metabolic Panel: Recent Labs  Lab 06/21/19 0351 06/22/19 0343 06/23/19 0315 06/24/19 0417 06/26/19 0302 06/27/19 0346  NA 139 140 142 142 136 135  K 3.4* 4.1 4.5 4.6 5.2* 5.2*  CL 106 108 108 106 100 100  CO2 22 21* 22 25 25 25   GLUCOSE 180* 189* 206* 240* 192* 198*  BUN 27* 35* 36* 36* 43* 46*  CREATININE 0.82 0.90 0.95 0.97 0.89 0.93  CALCIUM 8.3* 8.2* 8.6* 9.0 8.9 8.8*  MG 2.0 2.0 2.2 2.1 1.9  --   PHOS 2.5 2.8 2.9 3.4  --   --    Liver Function Tests: Recent Labs  Lab 06/22/19 0343 06/23/19 0315 06/24/19 0417 06/26/19 0302 06/27/19 0346  AST 90* 48* 29 21 27   ALT 141* 108* 87* 54* 51*  ALKPHOS 84 95 88 81 78  BILITOT 0.9 0.5 0.6 0.7 0.5  PROT 5.4* 5.7* 5.7* 5.5* 5.3*  ALBUMIN 2.5* 2.6* 2.7* 2.4* 2.3*   No results for input(s): LIPASE, AMYLASE in the last 168 hours. No results for input(s): AMMONIA in the last 168 hours. CBC: Recent Labs  Lab 06/21/19 0351 06/22/19 0343 06/23/19 0315 06/24/19 0417 06/26/19 0302 06/27/19 0346  WBC 6.6 9.9 9.0 9.5 10.8* 9.0  NEUTROABS 6.0 9.1* 8.4* 8.8*  --   --   HGB 11.8* 11.2* 11.2* 11.4* 11.9* 12.0  HCT 36.9 34.0* 36.4 36.7 37.8 36.4  MCV 93.4 91.4 97.3 95.8 96.2 92.2  PLT 185 216 250 256 310 181   Cardiac Enzymes: No results for input(s): CKTOTAL, CKMB, CKMBINDEX, TROPONINI in the last 168 hours. BNP: Invalid input(s): POCBNP CBG: Recent Labs  Lab 06/26/19 2029 06/27/19 0815 06/27/19 1210  GLUCAP 158* 146* 137*   D-Dimer Recent Labs    06/26/19 0302 06/27/19 0346  DDIMER 0.83* 0.79*   Hgb A1c Recent Labs    06/27/19 0346  HGBA1C 6.5*   Lipid Profile No results for input(s): CHOL, HDL, LDLCALC, TRIG, CHOLHDL, LDLDIRECT in the last 72 hours. Thyroid function studies No results for input(s): TSH, T4TOTAL, T3FREE, THYROIDAB in the last 72 hours.  Invalid input(s): FREET3 Anemia work up Recent Labs    06/26/19 0302 06/27/19 0346  FERRITIN 120 113   Urinalysis    Component Value Date/Time    COLORURINE YELLOW 08/12/2017 [place] 08/12/2017 0627   LABSPEC 1.014 08/12/2017 0627   PHURINE 6.0  08/12/2017 0627   GLUCOSEU NEGATIVE 08/12/2017 0627   HGBUR SMALL (A) 08/12/2017 0627   HGBUR trace-intact 10/28/2009 0905   BILIRUBINUR NEGATIVE 08/12/2017 0627   KETONESUR NEGATIVE 08/12/2017 0627   PROTEINUR NEGATIVE 08/12/2017 0627   UROBILINOGEN 0.2 10/28/2009 0905   NITRITE NEGATIVE 08/12/2017 0627   LEUKOCYTESUR TRACE (A) 08/12/2017 0627   Sepsis Labs Invalid input(s): PROCALCITONIN,  WBC,  LACTICIDVEN   Time coordinating discharge: 35 minutes  SIGNED:  Mercy Riding, MD  [facility] 06/27/2019, 2:05 PM  If 7PM-7AM, please contact night-coverage [email or web address] Password [email or web address]

## 2019-06-27 NOTE — Progress Notes (Signed)
  Speech Language Pathology Treatment: Dysphagia  Patient Details Name: [name] MRN: [id number] DOB: April 03, 1922 Today's Date: 06/27/2019 Time: 1510-1540 SLP Time Calculation (min) (ACUTE ONLY): 30 min  Assessment / Plan / Recommendation Clinical Impression  Today pt seen to assess po tolerance.  Pt overt immediate cough with water and powedered medication mixed resulting in moderate respiratory distress for which she recovered in a few minutes, pt with no overt indication of aspiration with nectar liquids- 4 ounces observed.    She does have h/o stricture and is s/p dilatation "years ago".  She admitted consumption of thicker liquids is more comfortable for pt to swallow and she desire to continue to use as able.    Reviewed benefit of use of thickener and advised she needs to assure she stays hydrated.  Pt benefited from moderate verbal/visual cues and understanding of compensation strategies.  Follow up at SNF indicated.  Thanks.   HPI HPI: 85 yo female adm to [facility] with pna.  She has h/o CAD, chronic diastolic CHF, PAF on Eliquis, history of breast cancer recent diagnosis of COVID-19 on 11/28 at urgent care center.  She continued with nausea vomiting, and husband not able to take care of her and was admitted to a skilled nursing facility for further monitoring, was in azithromycin. Per MD note, at SNF she was found to have hypoxia in mid 80s chest x-ray with bilateral opacities.  Swallow evaluation ordered due to pt coughing some with po intake.  She denies h/o dysphagia but admits to some upper esophageal difficulty since current admit.  Per chart review has stricture hx in 2016.  Communication with pt is difficult due to her hearing loss.  Today pt seen to assess po tolerance given modification of diet yesterday to diminish risk and increase her comfort with po take.      SLP Plan  Continue with current plan of care       Recommendations  Medication Administration: Whole meds with  puree Supervision: Patient able to self feed Compensations: Slow rate;Small sips/bites Postural Changes and/or Swallow Maneuvers: Seated upright 90 degrees                Oral Care Recommendations: Oral care BID Follow up Recommendations: (at next venue of care) SLP Visit Diagnosis: Dysphagia, oropharyngeal phase (R13.12) Plan: Continue with current plan of care       GO                [name] 06/27/2019, 4:01 PM  [name], [employer] [facility] SLP [place] Pager [phone] Office [phone]

## 2019-06-27 NOTE — Progress Notes (Signed)
Physical Therapy Treatment Patient Details Name: [name] MRN: [id number] DOB: 08-Nov-1921 Today's Date: 06/27/2019    History of Present Illness 85 y.o. female with CAD, chronic diastolic CHF, PAF on Eliquis, history of breast cancer recent diagnosis of COVID-19 on 11/28 at urgent care center and discharged back to her apartment where she lives with her husband who is also Covid positive.  Pt found to have hypoxia at facility and admitted for Pneumonia due to COVID-19 virus    PT Comments    O2 sat 90% on RA during session today. Pt fatigues easily with activity. Assisted back to bed. Per chart, pt is to d/c to [facility] today.    Follow Up Recommendations  SNF     Equipment Recommendations  Rolling walker with 5" wheels    Recommendations for Other Services       Precautions / Restrictions Restrictions Weight Bearing Restrictions: No    Mobility  Bed Mobility Overal bed mobility: Needs Assistance Bed Mobility: Sit to Supine       Sit to supine: Min Marks      Transfers Overall transfer level: Needs assistance Equipment used: Rolling walker (2 wheeled) Transfers: Sit to/from Stand Sit to Stand: Min assist         General transfer comment: assist to rise and steady  Ambulation/Gait Ambulation/Gait assistance: Min Marks Gait Distance (Feet): 10 Feet Assistive device: Rolling walker (2 wheeled) Gait Pattern/deviations: Step-through pattern;Decreased stride length;Trunk flexed     General Gait Details: mildly unsteady but no LOB with RW use. O2 90% on RA. Pt fatigues easily.   Stairs             Wheelchair Mobility    Modified Rankin (Stroke Patients Only)       Balance Overall balance assessment: Needs assistance         Standing balance support: Bilateral upper extremity supported Standing balance-Leahy Scale: Poor                              Cognition Arousal/Alertness: Awake/alert Behavior During Therapy: WFL  for tasks assessed/performed Overall Cognitive Status: Difficult to assess                                 General Comments: pt appears HOH, also difficult due to airborne room filter      Exercises      General Comments        Pertinent Vitals/Pain Pain Assessment: Faces Faces Pain Scale: No hurt    Home Living                      Prior Function            PT Goals (current goals can now be found in the care plan section) Progress towards PT goals: Progressing toward goals    Frequency    Min 2X/week      PT Plan Current plan remains appropriate    Co-evaluation              AM-PAC PT "6 Clicks" Mobility   Outcome Measure  Help needed turning from your back to your side while in a flat bed without using bedrails?: A Little Help needed moving from lying on your back to sitting on the side of a flat bed without using bedrails?: A Little Help needed moving to  and from a bed to a chair (including a wheelchair)?: A Little Help needed standing up from a chair using your arms (e.g., wheelchair or bedside chair)?: A Little Help needed to walk in hospital room?: A Little Help needed climbing 3-5 steps with a railing? : A Lot 6 Click Score: 17    End of Session   Activity Tolerance: Patient limited by fatigue Patient left: in bed;with call bell/phone within reach;with bed alarm set   PT Visit Diagnosis: Difficulty in walking, not elsewhere classified (R26.2);Muscle weakness (generalized) (M62.81)     Time: 1444-1500 PT Time Calculation (min) (ACUTE ONLY): 16 min  Charges:  $Gait Training: 8-22 mins                        Weston Anna, [place] Pager: [phone] Office: [phone]

## 2019-06-27 NOTE — TOC Progression Note (Signed)
Transition of Care [facility]) - Progression Note    Patient Details  Name: [name] MRN: [id number] Date of Birth: [birth year]  Transition of Care [facility]) CM/SW Contact  Purcell Mouton, RN Phone Number: 06/27/2019, 1:56 PM  Clinical Narrative:    Pt will discharge to [facility].   Expected Discharge Plan: [place] Barriers to Discharge: No Barriers Identified  Expected Discharge Plan and Services Expected Discharge Plan: [place]   Discharge Planning Services: CM Consult   Living arrangements for the past 2 months: [place] Expected Discharge Date: 06/27/19                                     Social Determinants of Health (SDOH) Interventions    Readmission Risk Interventions No flowsheet data found.

## 2019-06-28 ENCOUNTER — Encounter: Payer: Self-pay | Admitting: Internal Medicine

## 2019-06-28 ENCOUNTER — Non-Acute Institutional Stay (SKILLED_NURSING_FACILITY): Payer: MEDICARE | Admitting: Internal Medicine

## 2019-06-28 DIAGNOSIS — I5030 Unspecified diastolic (congestive) heart failure: Secondary | ICD-10-CM

## 2019-06-28 DIAGNOSIS — J1282 Pneumonia due to coronavirus disease 2019: Secondary | ICD-10-CM

## 2019-06-28 DIAGNOSIS — U071 COVID-19: Secondary | ICD-10-CM

## 2019-06-28 DIAGNOSIS — J1289 Other viral pneumonia: Secondary | ICD-10-CM

## 2019-06-28 DIAGNOSIS — I4891 Unspecified atrial fibrillation: Secondary | ICD-10-CM | POA: Diagnosis not present

## 2019-06-28 DIAGNOSIS — E782 Mixed hyperlipidemia: Secondary | ICD-10-CM | POA: Diagnosis not present

## 2019-06-28 DIAGNOSIS — I10 Essential (primary) hypertension: Secondary | ICD-10-CM | POA: Diagnosis not present

## 2019-06-28 NOTE — Progress Notes (Signed)
Provider:  Veleta Miners MD Location:   [place] Room Number: [id number] Place of Service:  SNF [phone])  PCP: Isaac Bliss, Rayford Halsted, MD Patient Care Team: Isaac Bliss, Rayford Halsted, MD as PCP - General (Internal Medicine) Croitoru, Dani Flanigan, MD as PCP - Cardiology (Cardiology) Erroll Luna, MD as Consulting Physician (General Surgery) Magrinat, Virgie Dad, MD as Consulting Physician (Oncology) Arloa Koh, MD (Inactive) as Consulting Physician (Radiation Oncology) [name], RN as Registered Nurse [name], RN as Registered Nurse [name], NP (Inactive) as Nurse Practitioner (Nurse Practitioner) [name], MD as Consulting Physician (Gastroenterology)  Extended Emergency Contact Information Primary Emergency Contact: Oneal Grout Address: [facility]          [address]          [place], [place] [address] Johnnette Litter of [place] Phone: [phone] Relation: Spouse Secondary Emergency Contact: Arnette Felts Mobile Phone: [phone] Relation: Niece Interpreter needed? No  Code Status: DNR Goals of Care: Advanced Directive information Advanced Directives 06/20/2019  Does Patient Have a Medical Advance Directive? No  Type of Advance Directive -  Does patient want to make changes to medical advance directive? -  Copy of [place] in Chart? -  Would patient like information on creating a medical advance directive? No - Patient declined  Pre-existing out of facility DNR order (yellow form or pink MOST form) -      Chief Complaint  Patient presents with  . New Admit To SNF    Admission to SNF    HPI: Patient is a 85 y.o. female seen today for Readmission to SNF for Monitoring and Therapy Patient has a history of breast cancer s/p lumpectomy, A. fib on chronic Eliquis, coronary artery disease, hypertension, hyperlipidemia, LE edema  Admitted in hospital from 12/1-12/9 for Covid  Pneumonia treated with 5 days of remdesivir and 10 days of Decadron. Patient lives in [place] in [place] with her husband. They both tested positive and given to patient care for nausea vomiting.  She was sent home on azithromycin.  But she continued to get worse and was transferred to SNF.  Patient got hypoxic and her x-ray showed patchy bilateral airspace disease.  Was transferred to the hospital.  She was treated with remdesivir and dexamethasone She is not hypoxic anymore and is now back to SNF Patient unable to give me much history as she is very hard of hearing Per nurses she has been afebrile has been saturating well on room air.  Her appetite is still poor..  Continues to have cough but no nausea vomiting or abdominal pain  Past Medical History:  Diagnosis Date  . Allergic rhinitis, seasonal   . Breast cancer of upper-outer quadrant of right female breast ([place]) 09/13/2014  . Chronic venous insufficiency   . Dermatophytosis of nail   . Dysrhythmia    H/o Atrial fibrillation  . Edema leg   . Family history of malignant neoplasm of breast   . Family history of malignant neoplasm of ovary   . Goiter    right thyroid nodule   . HTN (hypertension) 08/13/2017  . HX: breast cancer    bilateral  . Menopausal syndrome   . Osteoarthritis   . Osteoporosis   . Squamous cell skin cancer    right lower leg  . Wears glasses   . Wears hearing aid    both ears   Past Surgical History:  Procedure Laterality Date  .  bilateral lumpectomies for breast cancer  Aug. 2007  . BREAST LUMPECTOMY WITH RADIOACTIVE SEED LOCALIZATION Right 10/01/2014   Procedure: BREAST LUMPECTOMY WITH RADIOACTIVE SEED LOCALIZATION;  Surgeon: Erroll Luna, MD;  Location: [facility];  Service: General;  Laterality: Right;  . BREAST LUMPECTOMY WITH RADIOACTIVE SEED LOCALIZATION Left 03/22/2019   Procedure: LEFT BREAST LUMPECTOMY WITH RADIOACTIVE SEED LOCALIZATION;  Surgeon: Erroll Luna, MD;  Location: [facility];   Service: General;  Laterality: Left;  . CATARACT EXTRACTION    . CHOLECYSTECTOMY N/A 08/13/2017   Procedure: LAPAROSCOPIC CHOLECYSTECTOMY WITH INTRAOPERATIVE CHOLANGIOGRAM;  Surgeon: Excell Seltzer, MD;  Location: [facility];  Service: General;  Laterality: N/A;  . DILATION AND CURETTAGE OF UTERUS    . ESOPHAGOGASTRODUODENOSCOPY  07/26/2011   Procedure: ESOPHAGOGASTRODUODENOSCOPY (EGD);  Surgeon: Jeryl Columbia, MD;  Location: [facility];  Service: Endoscopy;  Laterality: N/A;  . history of lumbar compression fracture    . left eardum sx  1983  . left knee arthroscopic surgery    . no screening colonoscopy    . s/p bilateral lumpectomies    . SAVORY DILATION  07/26/2011   Procedure: SAVORY DILATION;  Surgeon: Jeryl Columbia, MD;  Location: [facility];  Service: Endoscopy;  Laterality: N/A;  . status post resection squamous cell cancer right lower leg    . TONSILLECTOMY    . UMBILICAL HERNIA REPAIR      reports that she has never smoked. She has never used smokeless tobacco. She reports that she does not drink alcohol or use drugs. Social History   Socioeconomic History  . Marital status: Married    Spouse name: Not on file  . Number of children: Not on file  . Years of education: Not on file  . Highest education level: Not on file  Occupational History  . Not on file  Tobacco Use  . Smoking status: Never Smoker  . Smokeless tobacco: Never Used  Substance and Sexual Activity  . Alcohol use: No  . Drug use: No  . Sexual activity: Not on file  Other Topics Concern  . Not on file  Social History Narrative   Pt and her husband are Active members of [place] and have support from this community. No Children.    Social Determinants of Health   Financial Resource Strain:   . Difficulty of Paying Living Expenses: Not on file  Food Insecurity:   . Worried About Charity fundraiser in the Last Year: Not on file  . Ran Out of Food in the Last Year: Not on file   Transportation Needs:   . Lack of Transportation (Medical): Not on file  . Lack of Transportation (Non-Medical): Not on file  Physical Activity:   . Days of Exercise per Week: Not on file  . Minutes of Exercise per Session: Not on file  Stress:   . Feeling of Stress : Not on file  Social Connections:   . Frequency of Communication with Friends and Family: Not on file  . Frequency of Social Gatherings with Friends and Family: Not on file  . Attends Religious Services: Not on file  . Active Member of Clubs or Organizations: Not on file  . Attends Archivist Meetings: Not on file  . Marital Status: Not on file  Intimate Partner Violence:   . Fear of Current or Ex-Partner: Not on file  . Emotionally Abused: Not on file  . Physically Abused: Not on file  . Sexually Abused: Not  on file    Functional Status Survey:    Family History  Problem Relation Age of Onset  . Cancer Mother 40       breast  . Heart attack Father   . Heart attack Brother   . Heart attack Brother   . Cancer Maternal Aunt 55       breast  . Coronary artery disease Other   . Cancer Other        ovarian; daughter of mat aunt w/ BC in 44s  . Heart disease Brother   . Cancer Maternal Aunt 60       breast    Health Maintenance  Topic Date Due  . MAMMOGRAM  07/25/2019  . TETANUS/TDAP  05/04/2023  . INFLUENZA VACCINE  Completed  . DEXA SCAN  Completed  . PNA vac Low Risk Adult  Completed    Allergies  Allergen Reactions  . Celecoxib Swelling    Swelling-leg    Allergies as of 06/28/2019      Reactions   Celecoxib Swelling   Swelling-leg      Medication List       Accurate as of June 28, 2019 10:37 AM. If you have any questions, ask your nurse or doctor.        acetaminophen 325 MG tablet Commonly known as: TYLENOL Take 2 tablets (650 mg total) by mouth every 6 (six) hours as needed for mild pain (or Fever >/= 101).   apixaban 5 MG Tabs tablet Commonly known as: ELIQUIS  Take 1 tablet (5 mg total) by mouth 2 (two) times daily.   atorvastatin 40 MG tablet Commonly known as: LIPITOR TAKE 1 TABLET BY MOUTH EVERY DAY   ipratropium 0.06 % nasal spray Commonly known as: ATROVENT PLACE 2 SPRAYS INTO BOTH NOSTRILS 4 (FOUR) TIMES DAILY.   metoprolol succinate 25 MG 24 hr tablet Commonly known as: TOPROL-XL Take 25 mg by mouth at bedtime.   montelukast 10 MG tablet Commonly known as: SINGULAIR TAKE 1 TABLET BY MOUTH EVERYDAY AT BEDTIME What changed: See the new instructions.   PreserVision AREDS 2 Caps Take 1 capsule by mouth 2 (two) times daily.   SYSTANE OP Place 1 drop into both eyes 2 (two) times daily as needed (dry eyes).       Review of Systems  Constitutional: Positive for activity change, appetite change and fatigue.  HENT: Negative.   Respiratory: Positive for cough.   Cardiovascular: Negative.   Gastrointestinal: Negative.   Genitourinary: Negative.   Musculoskeletal: Negative.   Neurological: Positive for weakness.  Psychiatric/Behavioral: Negative.   All other systems reviewed and are negative.   Vitals:   06/28/19 1034  BP: 138/62  Pulse: 68  Resp: 18  Temp: 97.9 F (36.6 C)  SpO2: 98%  Weight: 161 lb 12.8 oz (73.4 kg)  Height: 5\' 3"  (1.6 m)   Body mass index is 28.66 kg/m. Physical Exam  Constitutional: Oriented to person, place, and time. Well-developed and well-nourished.  HENT:  Head: Normocephalic.  Mouth/Throat: Oropharynx is clear and moist.  Eyes: Pupils are equal, round, and reactive to light.  Neck: Neck supple.  Cardiovascular: Normal rate and normal heart sounds.  No murmur heard. Pulmonary/Chest: Effort normal and breath sounds normal. No respiratory distress. No wheezes. She has no rales.  Abdominal: Soft. Bowel sounds are normal. No distension. There is no tenderness. There is no rebound.  Musculoskeletal: No edema.  Lymphadenopathy: none Neurological: Alert and oriented to person, place, and  time.  Skin: Skin is warm and dry.  Psychiatric: Normal mood and affect. Behavior is normal. Thought content normal.    Labs reviewed: Basic Metabolic Panel: Recent Labs    06/22/19 0343 06/23/19 0315 06/24/19 0417 06/26/19 0302 06/27/19 0346  NA 140 142 142 136 135  K 4.1 4.5 4.6 5.2* 5.2*  CL 108 108 106 100 100  CO2 21* 22 25 25 25   GLUCOSE 189* 206* 240* 192* 198*  BUN 35* 36* 36* 43* 46*  CREATININE 0.90 0.95 0.97 0.89 0.93  CALCIUM 8.2* 8.6* 9.0 8.9 8.8*  MG 2.0 2.2 2.1 1.9  --   PHOS 2.8 2.9 3.4  --   --    Liver Function Tests: Recent Labs    06/24/19 0417 06/26/19 0302 06/27/19 0346  AST 29 21 27   ALT 87* 54* 51*  ALKPHOS 88 81 78  BILITOT 0.6 0.7 0.5  PROT 5.7* 5.5* 5.3*  ALBUMIN 2.7* 2.4* 2.3*   No results for input(s): LIPASE, AMYLASE in the last 8760 hours. No results for input(s): AMMONIA in the last 8760 hours. CBC: Recent Labs    06/22/19 0343 06/23/19 0315 06/24/19 0417 06/26/19 0302 06/27/19 0346  WBC 9.9 9.0 9.5 10.8* 9.0  NEUTROABS 9.1* 8.4* 8.8*  --   --   HGB 11.2* 11.2* 11.4* 11.9* 12.0  HCT 34.0* 36.4 36.7 37.8 36.4  MCV 91.4 97.3 95.8 96.2 92.2  PLT 216 250 256 310 181   Cardiac Enzymes: No results for input(s): CKTOTAL, CKMB, CKMBINDEX, TROPONINI in the last 8760 hours. BNP: Invalid input(s): POCBNP Lab Results  Component Value Date   HGBA1C 6.5 (H) 06/27/2019   Lab Results  Component Value Date   TSH 4.04 10/06/2018   Lab Results  Component Value Date   VITAMINB12 165 (L) 10/06/2018   Lab Results  Component Value Date   FOLATE 5.8 (L) 08/14/2017   Lab Results  Component Value Date   IRON 16 (L) 08/14/2017   TIBC 266 08/14/2017   FERRITIN 113 06/27/2019    Imaging and Procedures obtained prior to SNF admission: DG Chest Port 1 View  Result Date: 06/19/2019 CLINICAL DATA:  COVID, cough EXAM: PORTABLE CHEST 1 VIEW COMPARISON:  08/29/2017 FINDINGS: Patchy bilateral airspace opacities are noted. Heart is  borderline in size. No visible significant effusions or acute bony abnormality. IMPRESSION: Patchy bilateral airspace disease concerning for pneumonia. Electronically Signed   By: Rolm Baptise M.D.   On: 06/19/2019 22:08    Assessment/Plan Covid Positive With Pneumonia Her room air oxygen is 90%.  She is afebrile. Received remdesivir and dexamethasone in the hospital She is already on Eliquis for her A. Fib We will repeat CMP and CBC tomorrow Atrial fibrillation On Eliquis and metoprolol History of diastolic CHF with lower extremity edema Continue to hold Lasix due to her decreased appetite Hyperlipidemia Continue on Lipitor Family/ staff Communication:   Labs/tests ordered: Follow up CMP and CBC  Total time spent in this patient care encounter was  45_  minutes; greater than 50% of the visit spent counseling patient and staff, reviewing records , Labs and coordinating care for problems addressed at this encounter.

## 2019-06-29 LAB — CBC: RBC: 3.88 (ref 3.87–5.11)

## 2019-06-29 LAB — BASIC METABOLIC PANEL
BUN: 38 — AB (ref 4–21)
CO2: 29 — AB (ref 13–22)
Chloride: 103 (ref 99–108)
Creatinine: 1 (ref 0.5–1.1)
Glucose: 127
Potassium: 4.5 (ref 3.4–5.3)
Sodium: 140 (ref 137–147)

## 2019-06-29 LAB — COMPREHENSIVE METABOLIC PANEL
Albumin: 2.6 — AB (ref 3.5–5.0)
Calcium: 8 — AB (ref 8.7–10.7)
Globulin: 2.1

## 2019-06-29 LAB — HEPATIC FUNCTION PANEL
ALT: 56 — AB (ref 7–35)
AST: 28 (ref 13–35)
Alkaline Phosphatase: 78 (ref 25–125)
Bilirubin, Total: 0.8

## 2019-06-29 LAB — CBC AND DIFFERENTIAL
HCT: 36 (ref 36–46)
Hemoglobin: 11.8 — AB (ref 12.0–16.0)
Neutrophils Absolute: 5949
Platelets: 252 (ref 150–399)
WBC: 7.9

## 2019-07-02 DIAGNOSIS — N183 Chronic kidney disease, stage 3 unspecified: Secondary | ICD-10-CM | POA: Insufficient documentation

## 2019-07-03 ENCOUNTER — Other Ambulatory Visit: Payer: Self-pay | Admitting: Allergy and Immunology

## 2019-07-03 ENCOUNTER — Encounter: Payer: Self-pay | Admitting: Internal Medicine

## 2019-07-03 ENCOUNTER — Non-Acute Institutional Stay (SKILLED_NURSING_FACILITY): Payer: MEDICARE | Admitting: Internal Medicine

## 2019-07-03 DIAGNOSIS — I4891 Unspecified atrial fibrillation: Secondary | ICD-10-CM

## 2019-07-03 DIAGNOSIS — I10 Essential (primary) hypertension: Secondary | ICD-10-CM | POA: Diagnosis not present

## 2019-07-03 DIAGNOSIS — U071 COVID-19: Secondary | ICD-10-CM

## 2019-07-03 DIAGNOSIS — J1282 Pneumonia due to coronavirus disease 2019: Secondary | ICD-10-CM

## 2019-07-03 DIAGNOSIS — I5030 Unspecified diastolic (congestive) heart failure: Secondary | ICD-10-CM

## 2019-07-03 DIAGNOSIS — J1289 Other viral pneumonia: Secondary | ICD-10-CM

## 2019-07-03 LAB — MAGNESIUM: Magnesium: 1.9

## 2019-07-03 NOTE — Progress Notes (Signed)
Location:   [facility] Room Number: [id number] Place of Service:  SNF [phone]) Provider:  Veleta Miners MD  Isaac Bliss, Rayford Halsted, MD  Patient Care Team: Isaac Bliss, Rayford Halsted, MD as PCP - General (Internal Medicine) Croitoru, Dani Pfannenstiel, MD as PCP - Cardiology (Cardiology) Erroll Luna, MD as Consulting Physician (General Surgery) Magrinat, Virgie Dad, MD as Consulting Physician (Oncology) Arloa Koh, MD (Inactive) as Consulting Physician (Radiation Oncology) [name], RN as Registered Nurse [name], RN as Registered Nurse [name], NP (Inactive) as Nurse Practitioner (Nurse Practitioner) [name], MD as Consulting Physician (Gastroenterology)  Extended Emergency Contact Information Primary Emergency Contact: Oneal Grout Address: [facility]          [address]          [place], [place] [address] Johnnette Litter of [place] Phone: [phone] Relation: Spouse Secondary Emergency Contact: Arnette Felts Mobile Phone: [phone] Relation: Niece Interpreter needed? No  Code Status:  DNR Goals of care: Advanced Directive information Advanced Directives 06/20/2019  Does Patient Have a Medical Advance Directive? No  Type of Advance Directive -  Does patient want to make changes to medical advance directive? -  Copy of [place] in Chart? -  Would patient like information on creating a medical advance directive? No - Patient declined  Pre-existing out of facility DNR order (yellow form or pink MOST form) -     Chief Complaint  Patient presents with  . Acute Visit    Follow Up    HPI:  Pt is a 85 y.o. female seen today for an acute visit for Follow up of Covid Pneumonia  Patient has a history of breast cancer s/p lumpectomy, A. fib on chronic Eliquis, coronary artery disease, hypertension, hyperlipidemia, LE edema  Admitted in hospital from 12/1-12/9 for Covid Pneumonia treated  with 5 days of remdesivir and 10 days of Decadron. Patient lives in [place] in [place] with her husband. They both tested positive and given to patient care for nausea vomiting.  She was sent home on azithromycin.  But she continued to get worse and was transferred to SNF.  Patient got hypoxic and her x-ray showed patchy bilateral airspace disease.  Was transferred to the hospital.  She was treated with remdesivir and dexamethasone  She is now in SNF for further care and monitoring She is doing well. No Nausea or vomiting Eating is fair has lost 5 lbs. No SOB but Coughing.No Fever  Walking with the walker and Mild Assist   Past Medical History:  Diagnosis Date  . Allergic rhinitis, seasonal   . Breast cancer of upper-outer quadrant of right female breast ([place]) 09/13/2014  . Chronic venous insufficiency   . Dermatophytosis of nail   . Dysrhythmia    H/o Atrial fibrillation  . Edema leg   . Family history of malignant neoplasm of breast   . Family history of malignant neoplasm of ovary   . Goiter    right thyroid nodule   . HTN (hypertension) 08/13/2017  . HX: breast cancer    bilateral  . Menopausal syndrome   . Osteoarthritis   . Osteoporosis   . Squamous cell skin cancer    right lower leg  . Wears glasses   . Wears hearing aid    both ears   Past Surgical History:  Procedure Laterality Date  . bilateral lumpectomies for breast cancer  Aug. 2007  . BREAST LUMPECTOMY WITH RADIOACTIVE SEED  LOCALIZATION Right 10/01/2014   Procedure: BREAST LUMPECTOMY WITH RADIOACTIVE SEED LOCALIZATION;  Surgeon: Erroll Luna, MD;  Location: [facility];  Service: General;  Laterality: Right;  . BREAST LUMPECTOMY WITH RADIOACTIVE SEED LOCALIZATION Left 03/22/2019   Procedure: LEFT BREAST LUMPECTOMY WITH RADIOACTIVE SEED LOCALIZATION;  Surgeon: Erroll Luna, MD;  Location: [facility];  Service: General;  Laterality: Left;  . CATARACT EXTRACTION    . CHOLECYSTECTOMY N/A 08/13/2017   Procedure:  LAPAROSCOPIC CHOLECYSTECTOMY WITH INTRAOPERATIVE CHOLANGIOGRAM;  Surgeon: Excell Seltzer, MD;  Location: [facility];  Service: General;  Laterality: N/A;  . DILATION AND CURETTAGE OF UTERUS    . ESOPHAGOGASTRODUODENOSCOPY  07/26/2011   Procedure: ESOPHAGOGASTRODUODENOSCOPY (EGD);  Surgeon: Jeryl Columbia, MD;  Location: [facility];  Service: Endoscopy;  Laterality: N/A;  . history of lumbar compression fracture    . left eardum sx  1983  . left knee arthroscopic surgery    . no screening colonoscopy    . s/p bilateral lumpectomies    . SAVORY DILATION  07/26/2011   Procedure: SAVORY DILATION;  Surgeon: Jeryl Columbia, MD;  Location: [facility];  Service: Endoscopy;  Laterality: N/A;  . status post resection squamous cell cancer right lower leg    . TONSILLECTOMY    . UMBILICAL HERNIA REPAIR      Allergies  Allergen Reactions  . Celecoxib Swelling    Swelling-leg    Allergies as of 07/03/2019      Reactions   Celecoxib Swelling   Swelling-leg      Medication List       Accurate as of July 03, 2019  2:09 PM. If you have any questions, ask your nurse or doctor.        acetaminophen 325 MG tablet Commonly known as: TYLENOL Take 2 tablets (650 mg total) by mouth every 6 (six) hours as needed for mild pain (or Fever >/= 101).   apixaban 5 MG Tabs tablet Commonly known as: ELIQUIS Take 1 tablet (5 mg total) by mouth 2 (two) times daily.   atorvastatin 40 MG tablet Commonly known as: LIPITOR TAKE 1 TABLET BY MOUTH EVERY DAY   ipratropium 0.06 % nasal spray Commonly known as: ATROVENT PLACE 2 SPRAYS INTO BOTH NOSTRILS 4 (FOUR) TIMES DAILY.   metoprolol succinate 25 MG 24 hr tablet Commonly known as: TOPROL-XL Take 25 mg by mouth at bedtime.   montelukast 10 MG tablet Commonly known as: SINGULAIR TAKE 1 TABLET BY MOUTH EVERYDAY AT BEDTIME What changed: See the new instructions.   PreserVision AREDS 2 Caps Take 1 capsule by mouth 2 (two) times daily.   SYSTANE  OP Place 1 drop into both eyes 2 (two) times daily as needed (dry eyes).       Review of Systems Review of Systems  Constitutional: Negative for activity change, appetite change, chills, diaphoresis, fatigue and fever.  HENT: Negative for mouth sores, postnasal drip, rhinorrhea, sinus pain and sore throat.   Respiratory: Negative for apnea,  chest tightness, shortness of breath and wheezing.   Cardiovascular: Negative for chest pain, palpitations and leg swelling.  Gastrointestinal: Negative for abdominal distention, abdominal pain, constipation, diarrhea, nausea and vomiting.  Genitourinary: Negative for dysuria and frequency.  Musculoskeletal: Negative for arthralgias, joint swelling and myalgias.  Skin: Negative for rash.  Neurological: Negative for syncope, weakness, light-headedness and numbness.  Psychiatric/Behavioral: Negative for behavioral problems, confusion and sleep disturbance.    Immunization History  Administered Date(s) Administered  . Influenza Split 04/19/2011, 05/01/2012  .  Influenza Whole 04/18/2006, 05/02/2007, 04/10/2009, 04/14/2010  . Influenza, High Dose Seasonal PF 05/03/2013, 06/05/2015, 05/16/2017, 05/02/2019  . Influenza,inj,Quad PF,6+ Mos 04/20/2018  . Influenza-Unspecified 05/06/2014, 04/29/2016  . PPD Test 02/08/2011  . Pneumococcal Conjugate-13 05/06/2012  . Pneumococcal Polysaccharide-23 04/18/2005, 02/08/2011  . Td 04/18/2005  . Zoster 05/18/2012   Pertinent  Health Maintenance Due  Topic Date Due  . MAMMOGRAM  07/25/2019  . INFLUENZA VACCINE  Completed  . DEXA SCAN  Completed  . PNA vac Low Risk Adult  Completed   Fall Risk  10/06/2018 06/09/2018 02/15/2018 08/09/2016 08/07/2015  Falls in the past year? 0 0 No Yes No  Comment - - Emmi Telephone Survey: data to providers prior to load - -  Number falls in past yr: 0 0 - 1 -  Injury with Fall? 0 0 - No -   Functional Status Survey:    Vitals:   07/03/19 1406  BP: (!) 138/52  Pulse: 84   Resp: 18  Temp: 98.7 F (37.1 C)  SpO2: 93%  Weight: 157 lb 12.8 oz (71.6 kg)  Height: 5\' 3"  (1.6 m)   Body mass index is 27.95 kg/m. Physical Exam  Constitutional: Oriented to person, place, and time. Well-developed and well-nourished.  HENT:  Head: Normocephalic.  Mouth/Throat: Oropharynx is clear and moist.  Eyes: Pupils are equal, round, and reactive to light.  Neck: Neck supple.  Cardiovascular: Normal rate and normal heart sounds.  Irregular Heart beat Pulmonary/Chest: Effort normal and breath sounds normal. No respiratory distress. No wheezes. Has rales Right more then Left Abdominal: Soft. Bowel sounds are normal. No distension. There is no tenderness. There is no rebound.  Musculoskeletal: No edema.  Lymphadenopathy: none Neurological: Alert and oriented to person, place, and time.  Skin: Skin is warm and dry.  Psychiatric: Normal mood and affect. Behavior is normal. Thought content normal.    Labs reviewed: Recent Labs    06/22/19 0343 06/23/19 0315 06/24/19 0417 06/26/19 0302 06/27/19 0346 06/29/19 0000  NA 140 142 142 136 135 140  K 4.1 4.5 4.6 5.2* 5.2* 4.5  CL 108 108 106 100 100 103  CO2 21* 22 25 25 25  29*  GLUCOSE 189* 206* 240* 192* 198*  --   BUN 35* 36* 36* 43* 46* 38*  CREATININE 0.90 0.95 0.97 0.89 0.93 1.0  CALCIUM 8.2* 8.6* 9.0 8.9 8.8* 8.0*  MG 2.0 2.2 2.1 1.9  --  1.9  PHOS 2.8 2.9 3.4  --   --   --    Recent Labs    06/24/19 0417 06/26/19 0302 06/27/19 0346 06/29/19 0000  AST 29 21 27 28   ALT 87* 54* 51* 56*  ALKPHOS 88 81 78 78  BILITOT 0.6 0.7 0.5  --   PROT 5.7* 5.5* 5.3*  --   ALBUMIN 2.7* 2.4* 2.3* 2.6*   Recent Labs    06/23/19 0315 06/24/19 0417 06/26/19 0302 06/27/19 0346 06/29/19 0000  WBC 9.0 9.5 10.8* 9.0 7.9  NEUTROABS 8.4* 8.8*  --   --  5,949  HGB 11.2* 11.4* 11.9* 12.0 11.8*  HCT 36.4 36.7 37.8 36.4 36  MCV 97.3 95.8 96.2 92.2  --   PLT 250 256 310 181 252   Lab Results  Component Value Date   TSH  4.04 10/06/2018   Lab Results  Component Value Date   HGBA1C 6.5 (H) 06/27/2019   Lab Results  Component Value Date   CHOL 149 10/06/2018   HDL 55.10 10/06/2018  LDLCALC 76 10/06/2018   LDLDIRECT 173.9 06/25/2013   TRIG 59 06/19/2019   CHOLHDL 3 10/06/2018    Significant Diagnostic Results in last 30 days:  DG Chest Port 1 View  Result Date: 06/19/2019 CLINICAL DATA:  COVID, cough EXAM: PORTABLE CHEST 1 VIEW COMPARISON:  08/29/2017 FINDINGS: Patchy bilateral airspace opacities are noted. Heart is borderline in size. No visible significant effusions or acute bony abnormality. IMPRESSION: Patchy bilateral airspace disease concerning for pneumonia. Electronically Signed   By: Rolm Baptise M.D.   On: 06/19/2019 22:08    Assessment/Plan  Covid Positive With Pneumonia On RA now D/W nurses to make him get out of bed more and sit Supine Still has some cough  Atrial fibrillation On Eliquis and Metoprolol  History of diastolic CHF with lower extremity edema Will Continue to hold Lasix for now Repeat Bmp in 1 week Hyperlipidemia Continue on Lipitor  Family/ staff Communication:   Labs/tests ordered:  BMP in 1 week  Total time spent in this patient care encounter was  25_  minutes; greater than 50% of the visit spent counseling patient and staff, reviewing records , Labs and coordinating care for problems addressed at this encounter.

## 2019-07-09 ENCOUNTER — Non-Acute Institutional Stay (SKILLED_NURSING_FACILITY): Payer: MEDICARE | Admitting: Nurse Practitioner

## 2019-07-09 ENCOUNTER — Encounter: Payer: Self-pay | Admitting: Nurse Practitioner

## 2019-07-09 DIAGNOSIS — I4891 Unspecified atrial fibrillation: Secondary | ICD-10-CM

## 2019-07-09 DIAGNOSIS — I10 Essential (primary) hypertension: Secondary | ICD-10-CM

## 2019-07-09 DIAGNOSIS — D649 Anemia, unspecified: Secondary | ICD-10-CM

## 2019-07-09 LAB — BASIC METABOLIC PANEL
BUN: 14 (ref 4–21)
CO2: 24 — AB (ref 13–22)
Chloride: 111 — AB (ref 99–108)
Creatinine: 0.7 (ref 0.5–1.1)
Glucose: 123
Potassium: 4.2 (ref 3.4–5.3)
Sodium: 141 (ref 137–147)

## 2019-07-09 LAB — CBC AND DIFFERENTIAL
HCT: 27 — AB (ref 36–46)
Hemoglobin: 8.7 — AB (ref 12.0–16.0)
Platelets: 153 (ref 150–399)
WBC: 3.9

## 2019-07-09 LAB — COMPREHENSIVE METABOLIC PANEL
Albumin: 2.4 — AB (ref 3.5–5.0)
Calcium: 7.7 — AB (ref 8.7–10.7)
Globulin: 2.1

## 2019-07-09 LAB — CBC: RBC: 2.89 — AB (ref 3.87–5.11)

## 2019-07-09 NOTE — Progress Notes (Signed)
Location:   [facility] Room Number: [id number] Place of Service:  SNF (31) Provider:  Aasiyah Auerbach NP  Isaac Bliss, Rayford Halsted, MD  Patient Care Team: Isaac Bliss, Rayford Halsted, MD as PCP - General (Internal Medicine) Croitoru, Dani Josey, MD as PCP - Cardiology (Cardiology) Erroll Luna, MD as Consulting Physician (General Surgery) Magrinat, Virgie Dad, MD as Consulting Physician (Oncology) Arloa Koh, MD (Inactive) as Consulting Physician (Radiation Oncology) [name], RN as Registered Nurse [name], RN as Registered Nurse [name], NP (Inactive) as Nurse Practitioner (Nurse Practitioner) [name], MD as Consulting Physician (Gastroenterology)  Extended Emergency Contact Information Primary Emergency Contact: Oneal Grout Address: [facility]          [address]          [place], [place] [address] Johnnette Litter of [place] Phone: [phone] Relation: Spouse Secondary Emergency Contact: Arnette Felts Mobile Phone: [phone] Relation: Niece Interpreter needed? No  Code Status:  DNR Goals of care: Advanced Directive information Advanced Directives 06/20/2019  Does Patient Have a Medical Advance Directive? No  Type of Advance Directive -  Does patient want to make changes to medical advance directive? -  Copy of [place] in Chart? -  Would patient like information on creating a medical advance directive? No - Patient declined  Pre-existing out of facility DNR order (yellow form or pink MOST form) -     Chief Complaint  Patient presents with  . Acute Visit    Anemia, hemoglobin dropped from 11s to 8s    HPI:  Pt is a 85 y.o. female seen today for an acute visit for acute blood loss anemia, the patient CBC 07/09/19 showed  Hgb 8.7 07/09/19 dropped from 11s previously. the patient stated there was blood on toilet tissue yesterday, not today. She denied nausea, vomiting, abd pain,  indigestion, or blood in urine. Hx of Afib heart rate is in control, on Eliquis 5mg  bid, Metoprolol 25mg  every day.    Past Medical History:  Diagnosis Date  . Allergic rhinitis, seasonal   . Breast cancer of upper-outer quadrant of right female breast ([facility]) 09/13/2014  . Chronic venous insufficiency   . Dermatophytosis of nail   . Dysrhythmia    H/o Atrial fibrillation  . Edema leg   . Family history of malignant neoplasm of breast   . Family history of malignant neoplasm of ovary   . Goiter    right thyroid nodule   . HTN (hypertension) 08/13/2017  . HX: breast cancer    bilateral  . Menopausal syndrome   . Osteoarthritis   . Osteoporosis   . Squamous cell skin cancer    right lower leg  . Wears glasses   . Wears hearing aid    both ears   Past Surgical History:  Procedure Laterality Date  . bilateral lumpectomies for breast cancer  Aug. 2007  . BREAST LUMPECTOMY WITH RADIOACTIVE SEED LOCALIZATION Right 10/01/2014   Procedure: BREAST LUMPECTOMY WITH RADIOACTIVE SEED LOCALIZATION;  Surgeon: Erroll Luna, MD;  Location: [facility];  Service: General;  Laterality: Right;  . BREAST LUMPECTOMY WITH RADIOACTIVE SEED LOCALIZATION Left 03/22/2019   Procedure: LEFT BREAST LUMPECTOMY WITH RADIOACTIVE SEED LOCALIZATION;  Surgeon: Erroll Luna, MD;  Location: [facility];  Service: General;  Laterality: Left;  . CATARACT EXTRACTION    . CHOLECYSTECTOMY N/A 08/13/2017   Procedure: LAPAROSCOPIC CHOLECYSTECTOMY WITH INTRAOPERATIVE CHOLANGIOGRAM;  Surgeon: Excell Seltzer, MD;  Location: [facility];  Service: General;  Laterality: N/A;  . DILATION AND CURETTAGE OF UTERUS    . ESOPHAGOGASTRODUODENOSCOPY  07/26/2011   Procedure: ESOPHAGOGASTRODUODENOSCOPY (EGD);  Surgeon: Jeryl Columbia, MD;  Location: [facility];  Service: Endoscopy;  Laterality: N/A;  . history of lumbar compression fracture    . left eardum sx  1983  . left knee arthroscopic surgery    . no screening colonoscopy     . s/p bilateral lumpectomies    . SAVORY DILATION  07/26/2011   Procedure: SAVORY DILATION;  Surgeon: Jeryl Columbia, MD;  [facility];  Service: Endoscopy;  Laterality: N/A;  . status post resection squamous cell cancer right lower leg    . TONSILLECTOMY    . UMBILICAL HERNIA REPAIR      Allergies  Allergen Reactions  . Celecoxib Swelling    Swelling-leg    Allergies as of 07/09/2019      Reactions   Celecoxib Swelling   Swelling-leg      Medication List       Accurate as of July 09, 2019 11:59 PM. If you have any questions, ask your nurse or doctor.        acetaminophen 325 MG tablet Commonly known as: TYLENOL Take 2 tablets (650 mg total) by mouth every 6 (six) hours as needed for mild pain (or Fever >/= 101).   apixaban 5 MG Tabs tablet Commonly known as: ELIQUIS Take 1 tablet (5 mg total) by mouth 2 (two) times daily.   atorvastatin 40 MG tablet Commonly known as: LIPITOR TAKE 1 TABLET BY MOUTH EVERY DAY   ipratropium 0.06 % nasal spray Commonly known as: ATROVENT PLACE 2 SPRAYS INTO BOTH NOSTRILS 4 (FOUR) TIMES DAILY.   metoprolol succinate 25 MG 24 hr tablet Commonly known as: TOPROL-XL Take 25 mg by mouth at bedtime.   montelukast 10 MG tablet Commonly known as: SINGULAIR TAKE 1 TABLET BY MOUTH EVERYDAY AT BEDTIME What changed: See the new instructions.   PreserVision AREDS 2 Caps Take 1 capsule by mouth 2 (two) times daily.   SYSTANE OP Place 1 drop into both eyes 2 (two) times daily as needed (dry eyes).   Tubersol 5 UNIT/0.1ML injection Generic drug: tuberculin Inject into the skin once. Start taking on: July 11, 2019       Review of Systems  Constitutional: Negative for activity change, appetite change, chills, diaphoresis, fatigue and fever.  HENT: Positive for hearing loss. Negative for congestion and voice change.   Eyes: Negative for visual disturbance.  Respiratory: Negative for cough, shortness of breath and  wheezing.   Cardiovascular: Positive for leg swelling.  Gastrointestinal: Positive for blood in stool. Negative for abdominal distention, abdominal pain, constipation, diarrhea, nausea and vomiting.  Genitourinary: Negative for difficulty urinating, dysuria, hematuria and urgency.  Musculoskeletal: Positive for gait problem.  Skin: Negative for color change and pallor.  Neurological: Negative for dizziness, speech difficulty, weakness and headaches.  Psychiatric/Behavioral: Negative for agitation, behavioral problems, hallucinations and sleep disturbance. The patient is not nervous/anxious.     Immunization History  Administered Date(s) Administered  . Influenza Split 04/19/2011, 05/01/2012  . Influenza Whole 04/18/2006, 05/02/2007, 04/10/2009, 04/14/2010  . Influenza, High Dose Seasonal PF 05/03/2013, 06/05/2015, 05/16/2017, 05/02/2019  . Influenza,inj,Quad PF,6+ Mos 04/20/2018  . Influenza-Unspecified 05/06/2014, 04/29/2016  . PPD Test 02/08/2011  . Pneumococcal Conjugate-13 05/06/2012  . Pneumococcal Polysaccharide-23 04/18/2005, 02/08/2011  . Td 04/18/2005  . Zoster 05/18/2012   Pertinent  Health Maintenance Due  Topic Date Due  . MAMMOGRAM  07/25/2019  . INFLUENZA VACCINE  Completed  . DEXA SCAN  Completed  . PNA vac Low Risk Adult  Completed   Fall Risk  10/06/2018 06/09/2018 02/15/2018 08/09/2016 08/07/2015  Falls in the past year? 0 0 No Yes No  Comment - - Emmi Telephone Survey: data to providers prior to load - -  Number falls in past yr: 0 0 - 1 -  Injury with Fall? 0 0 - No -   Functional Status Survey:    Vitals:   07/09/19 1631  BP: 134/74  Pulse: 70  Resp: 20  Temp: 98 F (36.7 C)  SpO2: 94%  Weight: 156 lb (70.8 kg)  Height: 5\' 3"  (1.6 m)   Body mass index is 27.63 kg/m. Physical Exam Vitals and nursing note reviewed.  Constitutional:      General: She is not in acute distress.    Appearance: Normal appearance. She is not ill-appearing,  toxic-appearing or diaphoretic.  HENT:     Head: Normocephalic and atraumatic.     Nose: Nose normal.     Mouth/Throat:     Mouth: Mucous membranes are moist.  Eyes:     Extraocular Movements: Extraocular movements intact.     Conjunctiva/sclera: Conjunctivae normal.     Pupils: Pupils are equal, round, and reactive to light.  Cardiovascular:     Rate and Rhythm: Normal rate. Rhythm irregular.     Heart sounds: No murmur.  Pulmonary:     Breath sounds: Rales present. No wheezing or rhonchi.     Comments: Bibasilar rales.  Abdominal:     General: Bowel sounds are normal. There is no distension.     Palpations: Abdomen is soft.     Tenderness: There is no abdominal tenderness. There is no right CVA tenderness, left CVA tenderness, guarding or rebound.  Musculoskeletal:     Cervical back: Normal range of motion and neck supple.     Right lower leg: Edema present.     Left lower leg: Edema present.     Comments: Trace edema BLE  Skin:    General: Skin is warm and dry.  Neurological:     General: No focal deficit present.     Mental Status: She is alert and oriented to person, place, and time. Mental status is at baseline.     Motor: No weakness.     Coordination: Coordination normal.     Gait: Gait abnormal.  Psychiatric:        Mood and Affect: Mood normal.        Behavior: Behavior normal.        Thought Content: Thought content normal.        Judgment: Judgment normal.     Labs reviewed: Recent Labs    06/22/19 0343 06/23/19 0315 06/24/19 0417 06/26/19 0302 06/27/19 0346 06/29/19 0000 07/09/19 0000  NA 140 142 142 136 135 140 141  K 4.1 4.5 4.6 5.2* 5.2* 4.5 4.2  CL 108 108 106 100 100 103 111*  CO2 21* 22 25 25 25  29* 24*  GLUCOSE 189* 206* 240* 192* 198*  --   --   BUN 35* 36* 36* 43* 46* 38* 14  CREATININE 0.90 0.95 0.97 0.89 0.93 1.0 0.7  CALCIUM 8.2* 8.6* 9.0 8.9 8.8* 8.0* 7.7*  MG 2.0 2.2 2.1 1.9  --  1.9  --   PHOS 2.8 2.9 3.4  --   --   --   --  Recent Labs    06/24/19 0417 06/26/19 0302 06/27/19 0346 06/29/19 0000 07/09/19 0000  AST 29 21 27 28   --   ALT 87* 54* 51* 56*  --   ALKPHOS 88 81 78 78  --   BILITOT 0.6 0.7 0.5  --   --   PROT 5.7* 5.5* 5.3*  --   --   ALBUMIN 2.7* 2.4* 2.3* 2.6* 2.4*   Recent Labs    06/23/19 0315 06/24/19 0417 06/26/19 0302 06/27/19 0346 06/29/19 0000 07/09/19 0000  WBC 9.0 9.5 10.8* 9.0 7.9 3.9  NEUTROABS 8.4* 8.8*  --   --  5,949  --   HGB 11.2* 11.4* 11.9* 12.0 11.8* 8.7*  HCT 36.4 36.7 37.8 36.4 36 27*  MCV 97.3 95.8 96.2 92.2  --   --   PLT 250 256 310 181 252 153   Lab Results  Component Value Date   TSH 4.04 10/06/2018   Lab Results  Component Value Date   HGBA1C 6.5 (H) 06/27/2019   Lab Results  Component Value Date   CHOL 149 10/06/2018   HDL 55.10 10/06/2018   LDLCALC 76 10/06/2018   LDLDIRECT 173.9 06/25/2013   TRIG 59 06/19/2019   CHOLHDL 3 10/06/2018    Significant Diagnostic Results in last 30 days:  DG Chest Port 1 View  Result Date: 06/19/2019 CLINICAL DATA:  COVID, cough EXAM: PORTABLE CHEST 1 VIEW COMPARISON:  08/29/2017 FINDINGS: Patchy bilateral airspace opacities are noted. Heart is borderline in size. No visible significant effusions or acute bony abnormality. IMPRESSION: Patchy bilateral airspace disease concerning for pneumonia. Electronically Signed   By: Rolm Baptise M.D.   On: 06/19/2019 22:08    Assessment/Plan Acute on chronic anemia 07/09/19 the patient stated there was blood on toilet tissue yesterday, not today. She denied nausea, vomiting, abd pain, indigestion, or blood in urine. Hgb dropped from 11s to 8s, will CBC/diff, Fe, Fe Sat, TIBC, Ferritin, Vit B12, Folate, Retic count, FOBT x3, adding Protonix 40mg  qd for presumed GI bleed in setting of Eliquis use. Hold Eliquis 07/09/19, then reduce to 2.5mg  bid. Suggest delay dc.  06/2119 wbc 3.9, Hgb 8.7, plt 153, Na 141, K 4.2, Bun 14, creat 0.74, TP 4.5, albumin 2.4.    Atrial  fibrillation with RVR (HCC) Heart rate is in control, continue Metoprolol, decreased Eliquis to 2.5mg  bid/5mg  bid in setting of ? GI bleed.   Essential hypertension Blood pressure is controlled, continue Metoprolol.      Family/ staff Communication: plan of care reviewed with with patient and charge nurse.   Labs/tests ordered:  CBC/diff, Iron, Fe Sat, ferritin, Retic count, TIBC, Vit B12, Folate.   Time spend 25 minutes.

## 2019-07-10 ENCOUNTER — Encounter: Payer: Self-pay | Admitting: Internal Medicine

## 2019-07-10 ENCOUNTER — Encounter: Payer: Self-pay | Admitting: Nurse Practitioner

## 2019-07-10 ENCOUNTER — Non-Acute Institutional Stay (SKILLED_NURSING_FACILITY): Payer: MEDICARE | Admitting: Internal Medicine

## 2019-07-10 DIAGNOSIS — U071 COVID-19: Secondary | ICD-10-CM

## 2019-07-10 DIAGNOSIS — I4891 Unspecified atrial fibrillation: Secondary | ICD-10-CM | POA: Diagnosis not present

## 2019-07-10 DIAGNOSIS — D649 Anemia, unspecified: Secondary | ICD-10-CM

## 2019-07-10 DIAGNOSIS — R600 Localized edema: Secondary | ICD-10-CM

## 2019-07-10 DIAGNOSIS — J1289 Other viral pneumonia: Secondary | ICD-10-CM

## 2019-07-10 NOTE — Assessment & Plan Note (Signed)
Blood pressure is controlled, continue Metoprolol. 

## 2019-07-10 NOTE — Assessment & Plan Note (Signed)
07/09/19 the patient stated there was blood on toilet tissue yesterday, not today. She denied nausea, vomiting, abd pain, indigestion, or blood in urine. Hgb dropped from 11s to 8s, will CBC/diff, Fe, Fe Sat, TIBC, Ferritin, Vit B12, Folate, Retic count, FOBT x3, adding Protonix 40mg  qd for presumed GI bleed in setting of Eliquis use. Hold Eliquis 07/09/19, then reduce to 2.5mg  bid. Suggest delay dc.  06/2119 wbc 3.9, Hgb 8.7, plt 153, Na 141, K 4.2, Bun 14, creat 0.74, TP 4.5, albumin 2.4.

## 2019-07-10 NOTE — Progress Notes (Signed)
Location:   [place] Room Number: [id number] Place of Service:  SNF [phone]) Provider:  Veleta Miners MD  Isaac Bliss, Rayford Halsted, MD  Patient Care Team: Isaac Bliss, Rayford Halsted, MD as PCP - General (Internal Medicine) Croitoru, Dani Kretzschmar, MD as PCP - Cardiology (Cardiology) Erroll Luna, MD as Consulting Physician (General Surgery) Magrinat, Virgie Dad, MD as Consulting Physician (Oncology) Arloa Koh, MD (Inactive) as Consulting Physician (Radiation Oncology) [name], RN as Registered Nurse [name], RN as Registered Nurse [name], NP (Inactive) as Nurse Practitioner (Nurse Practitioner) [name], MD as Consulting Physician (Gastroenterology)  Extended Emergency Contact Information Primary Emergency Contact: Oneal Grout Address: [facility]          [address]          [place], [place] [address] Johnnette Litter of [place] Phone: [phone] Relation: Spouse Secondary Emergency Contact: Arnette Felts Mobile Phone: [phone] Relation: Niece Interpreter needed? No  Code Status:  DNR Goals of care: Advanced Directive information Advanced Directives 06/20/2019  Does Patient Have a Medical Advance Directive? No  Type of Advance Directive -  Does patient want to make changes to medical advance directive? -  Copy of [place] in Chart? -  Would patient like information on creating a medical advance directive? No - Patient declined  Pre-existing out of facility DNR order (yellow form or pink MOST form) -     Chief Complaint  Patient presents with  . Acute Visit    Anemia and GI bleed    HPI:  Pt is a 85 y.o. female seen today for an acute visit for Anemia and Gi bleed. She also is getting discharged from Acute unit to her apartment in [place] with her husband  Patient has a history of breast cancer s/p lumpectomy, A. fib on chronic Eliquis, coronary artery disease, hypertension,  hyperlipidemia, LE edema Admitted inhospital from 12/1-12/9 forCovid Pneumoniatreated with 5 days of remdesivir and 10 days of Decadron.  She was admitted in SNF for further monitoring. She did very well and is now ready to go back with her husband. But yesterday patient's hemoglobin came down from 12 to 8.7 She said that she has history of rectal bleeding and did have some blood few days ago. Denies any pain constipation nausea vomiting or melena.  She has not had any more episodes for past 48 hours.  Her Hemoccult has been negative We did hold her Eliquis yesterday.  And reduce the dose to 2.5 mg twice daily  Patient denies any cough shortness of breath has been saturating above 90 on room air.  Has been walking around in her room without any problems or assist She is very keen to go back today to her apartment  Past Medical History:  Diagnosis Date  . Allergic rhinitis, seasonal   . Breast cancer of upper-outer quadrant of right female breast ([facility] [place]) 09/13/2014  . Chronic venous insufficiency   . Dermatophytosis of nail   . Dysrhythmia    H/o Atrial fibrillation  . Edema leg   . Family history of malignant neoplasm of breast   . Family history of malignant neoplasm of ovary   . Goiter    right thyroid nodule   . HTN (hypertension) 08/13/2017  . HX: breast cancer    bilateral  . Menopausal syndrome   . Osteoarthritis   . Osteoporosis   . Squamous cell skin cancer    right lower leg  .  Wears glasses   . Wears hearing aid    both ears   Past Surgical History:  Procedure Laterality Date  . bilateral lumpectomies for breast cancer  Aug. 2007  . BREAST LUMPECTOMY WITH RADIOACTIVE SEED LOCALIZATION Right 10/01/2014   Procedure: BREAST LUMPECTOMY WITH RADIOACTIVE SEED LOCALIZATION;  Surgeon: Erroll Luna, MD;  Location: [employer];  Service: General;  Laterality: Right;  . BREAST LUMPECTOMY WITH RADIOACTIVE SEED LOCALIZATION Left 03/22/2019   Procedure: LEFT  BREAST LUMPECTOMY WITH RADIOACTIVE SEED LOCALIZATION;  Surgeon: Erroll Luna, MD;  Location: [facility];  Service: General;  Laterality: Left;  . CATARACT EXTRACTION    . CHOLECYSTECTOMY N/A 08/13/2017   Procedure: LAPAROSCOPIC CHOLECYSTECTOMY WITH INTRAOPERATIVE CHOLANGIOGRAM;  Surgeon: Excell Seltzer, MD;  Location: [facility];  Service: General;  Laterality: N/A;  . DILATION AND CURETTAGE OF UTERUS    . ESOPHAGOGASTRODUODENOSCOPY  07/26/2011   Procedure: ESOPHAGOGASTRODUODENOSCOPY (EGD);  Surgeon: Jeryl Columbia, MD;  Location: [facility];  Service: Endoscopy;  Laterality: N/A;  . history of lumbar compression fracture    . left eardum sx  1983  . left knee arthroscopic surgery    . no screening colonoscopy    . s/p bilateral lumpectomies    . SAVORY DILATION  07/26/2011   Procedure: SAVORY DILATION;  Surgeon: Jeryl Columbia, MD;  Location: [facility];  Service: Endoscopy;  Laterality: N/A;  . status post resection squamous cell cancer right lower leg    . TONSILLECTOMY    . UMBILICAL HERNIA REPAIR      Allergies  Allergen Reactions  . Celecoxib Swelling    Swelling-leg    Allergies as of 07/10/2019      Reactions   Celecoxib Swelling   Swelling-leg      Medication List       Accurate as of July 10, 2019  8:57 AM. If you have any questions, ask your nurse or doctor.        acetaminophen 325 MG tablet Commonly known as: TYLENOL Take 2 tablets (650 mg total) by mouth every 6 (six) hours as needed for mild pain (or Fever >/= 101).   atorvastatin 40 MG tablet Commonly known as: LIPITOR TAKE 1 TABLET BY MOUTH EVERY DAY   Eliquis 2.5 MG Tabs tablet Generic drug: apixaban Take 2.5 mg by mouth 2 (two) times daily. What changed: Another medication with the same name was removed. Continue taking this medication, and follow the directions you see here. Changed by: Virgie Dad, MD   ipratropium 0.06 % nasal spray Commonly known as: ATROVENT PLACE 2 SPRAYS INTO BOTH  NOSTRILS 4 (FOUR) TIMES DAILY.   metoprolol succinate 25 MG 24 hr tablet Commonly known as: TOPROL-XL Take 25 mg by mouth at bedtime.   montelukast 10 MG tablet Commonly known as: SINGULAIR TAKE 1 TABLET BY MOUTH EVERYDAY AT BEDTIME What changed: See the new instructions.   pantoprazole 40 MG tablet Commonly known as: PROTONIX Take 40 mg by mouth daily.   PreserVision AREDS 2 Caps Take 1 capsule by mouth 2 (two) times daily.   SYSTANE OP Place 1 drop into both eyes 2 (two) times daily as needed (dry eyes).   Tubersol 5 UNIT/0.1ML injection Generic drug: tuberculin Inject into the skin once. Start taking on: July 11, 2019       Review of Systems  Review of Systems  Constitutional: Negative for activity change, appetite change, chills, diaphoresis, fatigue and fever.  HENT: Negative for mouth sores, postnasal drip, rhinorrhea, sinus  pain and sore throat.   Respiratory: Negative for apnea, cough, chest tightness, shortness of breath and wheezing.   Cardiovascular: Negative for chest pain, palpitations and leg swelling.  Gastrointestinal: Negative for abdominal distention, abdominal pain, constipation, diarrhea, nausea and vomiting.  Genitourinary: Negative for dysuria and frequency.  Musculoskeletal: Negative for arthralgias, joint swelling and myalgias.  Skin: Negative for rash.  Neurological: Negative for dizziness, syncope, weakness, light-headedness and numbness.  Psychiatric/Behavioral: Negative for behavioral problems, confusion and sleep disturbance.     Immunization History  Administered Date(s) Administered  . Influenza Split 04/19/2011, 05/01/2012  . Influenza Whole 04/18/2006, 05/02/2007, 04/10/2009, 04/14/2010  . Influenza, High Dose Seasonal PF 05/03/2013, 06/05/2015, 05/16/2017, 05/02/2019  . Influenza,inj,Quad PF,6+ Mos 04/20/2018  . Influenza-Unspecified 05/06/2014, 04/29/2016  . PPD Test 02/08/2011  . Pneumococcal Conjugate-13 05/06/2012  .  Pneumococcal Polysaccharide-23 04/18/2005, 02/08/2011  . Td 04/18/2005  . Zoster 05/18/2012   Pertinent  Health Maintenance Due  Topic Date Due  . MAMMOGRAM  07/25/2019  . INFLUENZA VACCINE  Completed  . DEXA SCAN  Completed  . PNA vac Low Risk Adult  Completed   Fall Risk  10/06/2018 06/09/2018 02/15/2018 08/09/2016 08/07/2015  Falls in the past year? 0 0 No Yes No  Comment - - Emmi Telephone Survey: data to providers prior to load - -  Number falls in past yr: 0 0 - 1 -  Injury with Fall? 0 0 - No -   Functional Status Survey:    Vitals:   07/10/19 0853  BP: 134/70  Pulse: 94  Resp: 20  Temp: 98.5 F (36.9 C)  SpO2: 96%  Weight: 156 lb (70.8 kg)  Height: 5\' 3"  (1.6 m)   Body mass index is 27.63 kg/m. Physical Exam  Constitutional: Oriented to person, place, and time. Well-developed and well-nourished.  HENT:  Head: Normocephalic.  Mouth/Throat: Oropharynx is clear and moist.  Eyes: Pupils are equal, round, and reactive to light.  Neck: Neck supple.  Cardiovascular: Normal rate and normal heart sounds.  No murmur heard. Pulmonary/Chest: Effort normal and breath sounds normal. No respiratory distress. No wheezes. She has no rales.  Abdominal: Soft. Bowel sounds are normal. No distension. There is no tenderness. There is no rebound.  Musculoskeletal: Moderate Edema Bilateral  Lymphadenopathy: none Neurological: Alert and oriented to person, place, and time. No Focal Deficit Skin: Skin is warm and dry.  Psychiatric: Normal mood and affect. Behavior is normal. Thought content normal.    Labs reviewed: Recent Labs    06/22/19 0343 06/23/19 0315 06/24/19 0417 06/26/19 0302 06/27/19 0346 06/29/19 0000 07/09/19 0000  NA 140 142 142 136 135 140 141  K 4.1 4.5 4.6 5.2* 5.2* 4.5 4.2  CL 108 108 106 100 100 103 111*  CO2 21* 22 25 25 25  29* 24*  GLUCOSE 189* 206* 240* 192* 198*  --   --   BUN 35* 36* 36* 43* 46* 38* 14  CREATININE 0.90 0.95 0.97 0.89 0.93 1.0 0.7    CALCIUM 8.2* 8.6* 9.0 8.9 8.8* 8.0* 7.7*  MG 2.0 2.2 2.1 1.9  --  1.9  --   PHOS 2.8 2.9 3.4  --   --   --   --    Recent Labs    06/24/19 0417 06/26/19 0302 06/27/19 0346 06/29/19 0000 07/09/19 0000  AST 29 21 27 28   --   ALT 87* 54* 51* 56*  --   ALKPHOS 88 81 78 78  --   BILITOT 0.6 0.7  0.5  --   --   PROT 5.7* 5.5* 5.3*  --   --   ALBUMIN 2.7* 2.4* 2.3* 2.6* 2.4*   Recent Labs    06/23/19 0315 06/24/19 0417 06/26/19 0302 06/27/19 0346 06/29/19 0000 07/09/19 0000  WBC 9.0 9.5 10.8* 9.0 7.9 3.9  NEUTROABS 8.4* 8.8*  --   --  5,949  --   HGB 11.2* 11.4* 11.9* 12.0 11.8* 8.7*  HCT 36.4 36.7 37.8 36.4 36 27*  MCV 97.3 95.8 96.2 92.2  --   --   PLT 250 256 310 181 252 153   Lab Results  Component Value Date   TSH 4.04 10/06/2018   Lab Results  Component Value Date   HGBA1C 6.5 (H) 06/27/2019   Lab Results  Component Value Date   CHOL 149 10/06/2018   HDL 55.10 10/06/2018   LDLCALC 76 10/06/2018   LDLDIRECT 173.9 06/25/2013   TRIG 59 06/19/2019   CHOLHDL 3 10/06/2018    Significant Diagnostic Results in last 30 days:  DG Chest Port 1 View  Result Date: 06/19/2019 CLINICAL DATA:  COVID, cough EXAM: PORTABLE CHEST 1 VIEW COMPARISON:  08/29/2017 FINDINGS: Patchy bilateral airspace opacities are noted. Heart is borderline in size. No visible significant effusions or acute bony abnormality. IMPRESSION: Patchy bilateral airspace disease concerning for pneumonia. Electronically Signed   By: Rolm Baptise M.D.   On: 06/19/2019 22:08    Assessment/Plan Anemia possible due to GI loss Repeat Hgb today is 8.9 Can discharge home Will continue Eliquis at lower dose of 2.5 mg Reval in 2 weeks with repeat CBC Will also take Iron 3/week  Will let us know if any other episodes of bleeding  Covid Positive With Pneumonia Pox more then 90 on RA Doing well with no Cough or SOB  Atrial fibrillation Reduced Eliquis to 2.5 mg BID for now Reval in 2 weeks On  Metoprolol  History of diastolic CHF with lower extremity edema Restart her Lasix 20 mg QD Hyperlipidemia On statin  Family/ staff Communication:   Labs/tests ordered:  Will need BMP and CBC in 2 weeks

## 2019-07-10 NOTE — Assessment & Plan Note (Signed)
Heart rate is in control, continue Metoprolol, decreased Eliquis to 2.5mg  bid/5mg  bid in setting of ? GI bleed.

## 2019-07-11 DIAGNOSIS — M6281 Muscle weakness (generalized): Secondary | ICD-10-CM | POA: Diagnosis not present

## 2019-07-11 DIAGNOSIS — R1312 Dysphagia, oropharyngeal phase: Secondary | ICD-10-CM | POA: Diagnosis not present

## 2019-07-11 DIAGNOSIS — J129 Viral pneumonia, unspecified: Secondary | ICD-10-CM | POA: Diagnosis not present

## 2019-07-11 DIAGNOSIS — I4891 Unspecified atrial fibrillation: Secondary | ICD-10-CM | POA: Diagnosis not present

## 2019-07-11 DIAGNOSIS — R2681 Unsteadiness on feet: Secondary | ICD-10-CM | POA: Diagnosis not present

## 2019-07-11 DIAGNOSIS — U071 COVID-19: Secondary | ICD-10-CM | POA: Diagnosis not present

## 2019-07-11 DIAGNOSIS — R2689 Other abnormalities of gait and mobility: Secondary | ICD-10-CM | POA: Diagnosis not present

## 2019-07-12 ENCOUNTER — Telehealth: Payer: Self-pay

## 2019-07-12 DIAGNOSIS — R2689 Other abnormalities of gait and mobility: Secondary | ICD-10-CM | POA: Diagnosis not present

## 2019-07-12 DIAGNOSIS — R2681 Unsteadiness on feet: Secondary | ICD-10-CM | POA: Diagnosis not present

## 2019-07-12 DIAGNOSIS — I4891 Unspecified atrial fibrillation: Secondary | ICD-10-CM | POA: Diagnosis not present

## 2019-07-12 DIAGNOSIS — M6281 Muscle weakness (generalized): Secondary | ICD-10-CM | POA: Diagnosis not present

## 2019-07-12 DIAGNOSIS — U071 COVID-19: Secondary | ICD-10-CM | POA: Diagnosis not present

## 2019-07-12 DIAGNOSIS — J129 Viral pneumonia, unspecified: Secondary | ICD-10-CM | POA: Diagnosis not present

## 2019-07-12 NOTE — Telephone Encounter (Signed)
Discussed with the patient. She need to know to take Eliquis 2.5 mg BID instead of 5 mg till she comes and see Dr. Lyndel Safe. Also needs to take OTC iron once day with meals 3 /week.  She can also start taking her Lasix as before.  Patient patient states she is doing fine, no concerns. She is ''just moving slow". No further questions.

## 2019-07-17 DIAGNOSIS — U071 COVID-19: Secondary | ICD-10-CM | POA: Diagnosis not present

## 2019-07-17 DIAGNOSIS — I4891 Unspecified atrial fibrillation: Secondary | ICD-10-CM | POA: Diagnosis not present

## 2019-07-17 DIAGNOSIS — R2689 Other abnormalities of gait and mobility: Secondary | ICD-10-CM | POA: Diagnosis not present

## 2019-07-17 DIAGNOSIS — J129 Viral pneumonia, unspecified: Secondary | ICD-10-CM | POA: Diagnosis not present

## 2019-07-17 DIAGNOSIS — R2681 Unsteadiness on feet: Secondary | ICD-10-CM | POA: Diagnosis not present

## 2019-07-17 DIAGNOSIS — M6281 Muscle weakness (generalized): Secondary | ICD-10-CM | POA: Diagnosis not present

## 2019-07-19 DIAGNOSIS — R2681 Unsteadiness on feet: Secondary | ICD-10-CM | POA: Diagnosis not present

## 2019-07-19 DIAGNOSIS — R2689 Other abnormalities of gait and mobility: Secondary | ICD-10-CM | POA: Diagnosis not present

## 2019-07-19 DIAGNOSIS — J129 Viral pneumonia, unspecified: Secondary | ICD-10-CM | POA: Diagnosis not present

## 2019-07-19 DIAGNOSIS — U071 COVID-19: Secondary | ICD-10-CM | POA: Diagnosis not present

## 2019-07-19 DIAGNOSIS — I4891 Unspecified atrial fibrillation: Secondary | ICD-10-CM | POA: Diagnosis not present

## 2019-07-19 DIAGNOSIS — M6281 Muscle weakness (generalized): Secondary | ICD-10-CM | POA: Diagnosis not present

## 2019-07-25 ENCOUNTER — Other Ambulatory Visit: Payer: Self-pay

## 2019-07-25 ENCOUNTER — Non-Acute Institutional Stay: Payer: MEDICARE | Admitting: Internal Medicine

## 2019-07-25 ENCOUNTER — Encounter: Payer: Self-pay | Admitting: Internal Medicine

## 2019-07-25 VITALS — BP 126/46 | HR 83 | Temp 98.3°F | Ht 63.0 in | Wt 165.2 lb

## 2019-07-25 DIAGNOSIS — I10 Essential (primary) hypertension: Secondary | ICD-10-CM

## 2019-07-25 DIAGNOSIS — R600 Localized edema: Secondary | ICD-10-CM

## 2019-07-25 DIAGNOSIS — D649 Anemia, unspecified: Secondary | ICD-10-CM | POA: Diagnosis not present

## 2019-07-25 DIAGNOSIS — E782 Mixed hyperlipidemia: Secondary | ICD-10-CM | POA: Diagnosis not present

## 2019-07-25 DIAGNOSIS — U071 COVID-19: Secondary | ICD-10-CM | POA: Diagnosis not present

## 2019-07-25 DIAGNOSIS — I4891 Unspecified atrial fibrillation: Secondary | ICD-10-CM

## 2019-07-25 DIAGNOSIS — J1282 Pneumonia due to coronavirus disease 2019: Secondary | ICD-10-CM

## 2019-07-25 NOTE — Progress Notes (Signed)
Location: [place] of Service:  Clinic (12)  Provider:   Code Status:  Goals of Care:  Advanced Directives 06/20/2019  Does Patient Have a Medical Advance Directive? No  Type of Advance Directive -  Does patient want to make changes to medical advance directive? -  Copy of [place] in Chart? -  Would patient like information on creating a medical advance directive? No - Patient declined  Pre-existing out of facility DNR order (yellow form or pink MOST form) -     Chief Complaint  Patient presents with  . Transitions Of Care    Patient recently discharged from hospital after testing positive for covid.     HPI: Patient is a 86 y.o. female seen today for an acute visit for Follow up after staying in the Acute unit for her Covid Patient has a history of breast cancer s/p lumpectomy, A. fib on chronic Eliquis, coronary artery disease, hypertension, hyperlipidemia, LE edema Admitted inhospital from 12/1-12/9 forCovid Pneumoniatreated with 5 days of remdesivir and 10 days of Decadron.  She was admitted in SNF for further monitoring. During the last day before her discharge. Her Hgb fell from 12 to 8.7 with ? GI bleed. Nurses were not sure. But since Patient wanted to go back to her apartment. She was discharged with Lowering of her Eliquis to 2.5 mg BID and started on Iron She also was noticed to have increased Edema of her LE  So her Lasix was restarted  Covid Positive Pneumonia She says she has mild cough and does get little short of breath on exertion her sats are above 90 throughout her stay in SNF Lower extremity edema Continues to have edema in spite of taking Lasix every day Weakness and decreased appetite Is doing better has hired some outside help.  Also working with therapy twice a week Rectal bleed Patient said that she sometimes notices blood when she wipes herself.  Is able to tolerate iron. PAF Has follow-up with  cardiology in 2 weeks    Past Medical History:  Diagnosis Date  . Allergic rhinitis, seasonal   . Breast cancer of upper-outer quadrant of right female breast ([place]) 09/13/2014  . Chronic venous insufficiency   . Dermatophytosis of nail   . Dysrhythmia    H/o Atrial fibrillation  . Edema leg   . Family history of malignant neoplasm of breast   . Family history of malignant neoplasm of ovary   . Goiter    right thyroid nodule   . HTN (hypertension) 08/13/2017  . HX: breast cancer    bilateral  . Menopausal syndrome   . Osteoarthritis   . Osteoporosis   . Squamous cell skin cancer    right lower leg  . Wears glasses   . Wears hearing aid    both ears    Past Surgical History:  Procedure Laterality Date  . bilateral lumpectomies for breast cancer  Aug. 2007  . BREAST LUMPECTOMY WITH RADIOACTIVE SEED LOCALIZATION Right 10/01/2014   Procedure: BREAST LUMPECTOMY WITH RADIOACTIVE SEED LOCALIZATION;  Surgeon: Erroll Luna, MD;  Location: [facility];  Service: General;  Laterality: Right;  . BREAST LUMPECTOMY WITH RADIOACTIVE SEED LOCALIZATION Left 03/22/2019   Procedure: LEFT BREAST LUMPECTOMY WITH RADIOACTIVE SEED LOCALIZATION;  Surgeon: Erroll Luna, MD;  Location: [facility];  Service: General;  Laterality: Left;  . CATARACT EXTRACTION    . CHOLECYSTECTOMY N/A 08/13/2017   Procedure: LAPAROSCOPIC CHOLECYSTECTOMY WITH INTRAOPERATIVE CHOLANGIOGRAM;  Surgeon: Excell Seltzer, MD;  Location: [facility];  Service: General;  Laterality: N/A;  . DILATION AND CURETTAGE OF UTERUS    . ESOPHAGOGASTRODUODENOSCOPY  07/26/2011   Procedure: ESOPHAGOGASTRODUODENOSCOPY (EGD);  Surgeon: Jeryl Columbia, MD;  Location: [facility];  Service: Endoscopy;  Laterality: N/A;  . history of lumbar compression fracture    . left eardum sx  1983  . left knee arthroscopic surgery    . no screening colonoscopy    . s/p bilateral lumpectomies    . SAVORY DILATION  07/26/2011   Procedure: SAVORY  DILATION;  Surgeon: Jeryl Columbia, MD;  Location[facility];  Service: Endoscopy;  Laterality: N/A;  . status post resection squamous cell cancer right lower leg    . TONSILLECTOMY    . UMBILICAL HERNIA REPAIR      Allergies  Allergen Reactions  . Celecoxib Swelling    Swelling-leg    Outpatient Encounter Medications as of 07/25/2019  Medication Sig  . acetaminophen (TYLENOL) 325 MG tablet Take 2 tablets (650 mg total) by mouth every 6 (six) hours as needed for mild pain (or Fever >/= 101).  Marland Kitchen atorvastatin (LIPITOR) 40 MG tablet TAKE 1 TABLET BY MOUTH EVERY DAY (Patient taking differently: Take 40 mg by mouth daily. )  . ferrous sulfate 325 (65 FE) MG tablet Take 325 mg by mouth. once day with meals 3x /week.  . furosemide (LASIX) 20 MG tablet Take 20 mg by mouth daily.  Marland Kitchen ipratropium (ATROVENT) 0.06 % nasal spray PLACE 2 SPRAYS INTO BOTH NOSTRILS 4 (FOUR) TIMES DAILY.  . metoprolol succinate (TOPROL-XL) 25 MG 24 hr tablet Take 25 mg by mouth at bedtime.  . montelukast (SINGULAIR) 10 MG tablet TAKE 1 TABLET BY MOUTH EVERYDAY AT BEDTIME (Patient taking differently: Take 10 mg by mouth at bedtime. )  . Multiple Vitamins-Minerals (PRESERVISION AREDS 2) CAPS Take 1 capsule by mouth 2 (two) times daily.   . pantoprazole (PROTONIX) 40 MG tablet Take 40 mg by mouth daily.  Vladimir Faster Glycol-Propyl Glycol (SYSTANE OP) Place 1 drop into both eyes 2 (two) times daily as needed (dry eyes).  . [DISCONTINUED] apixaban (ELIQUIS) 2.5 MG TABS tablet Take 2.5 mg by mouth 2 (two) times daily.   No facility-administered encounter medications on file as of 07/25/2019.    Review of Systems:  Review of Systems  Constitutional: Positive for appetite change.  HENT: Negative.   Respiratory: Negative.   Cardiovascular: Positive for leg swelling.  Gastrointestinal: Negative.   Genitourinary: Negative.   Musculoskeletal: Positive for gait problem.  Skin: Negative.   Neurological: Positive for weakness.    Psychiatric/Behavioral: Negative.   All other systems reviewed and are negative.   Health Maintenance  Topic Date Due  . MAMMOGRAM  07/25/2019  . TETANUS/TDAP  05/04/2023  . INFLUENZA VACCINE  Completed  . DEXA SCAN  Completed  . PNA vac Low Risk Adult  Completed    Physical Exam: Vitals:   07/25/19 1542  BP: (!) 126/46  Pulse: 83  Temp: 98.3 F (36.8 C)  SpO2: 95%  Weight: 165 lb 3.2 oz (74.9 kg)  Height: 5\' 3"  (1.6 m)   Body mass index is 29.26 kg/m. Physical Exam Constitutional: Oriented to person, place, and time. Well-developed and well-nourished.  HENT:  Head: Normocephalic.  Mouth/Throat: Oropharynx is clear and moist.  Eyes: Pupils are equal, round, and reactive to light.  Neck: Neck supple.  Cardiovascular: Normal rate and normal heart sounds.  No murmur heard. Pulmonary/Chest: Effort  normal and breath sounds normal. No respiratory distress. No wheezes. She has no rales.  Abdominal: Soft. Bowel sounds are normal. No distension. There is no tenderness. There is no rebound.  Musculoskeletal: Moderate Edema Bilateral Lymphadenopathy: none Neurological: Alert and oriented to person, place, and time.  No Focal Deficits Skin: Skin is warm and dry.  Psychiatric: Normal mood and affect. Behavior is normal. Thought content normal.  Rectal Exam Did not show any skin tear No hemorrhoids Labs reviewed: Basic Metabolic Panel: Recent Labs    10/06/18 0946 10/18/18 1351 06/22/19 0343 06/23/19 0315 06/24/19 0417 06/26/19 0302 06/27/19 0346 06/29/19 0000 07/09/19 0000  NA 142  --  140 142 142 136 135 140 141  K 4.6  --  4.1 4.5 4.6 5.2* 5.2* 4.5 4.2  CL 104  --  108 108 106 100 100 103 111*  CO2 29  --  21* 22 25 25 25  29* 24*  GLUCOSE 104*  --  189* 206* 240* 192* 198*  --   --   BUN 32*  --  35* 36* 36* 43* 46* 38* 14  CREATININE 1.32*   < > 0.90 0.95 0.97 0.89 0.93 1.0 0.7  CALCIUM 9.0  --  8.2* 8.6* 9.0 8.9 8.8* 8.0* 7.7*  MG  --    < > 2.0 2.2 2.1 1.9   --  1.9  --   PHOS  --    < > 2.8 2.9 3.4  --   --   --   --   TSH 4.04  --   --   --   --   --   --   --   --    < > = values in this interval not displayed.   Liver Function Tests: Recent Labs    06/24/19 0417 06/26/19 0302 06/27/19 0346 06/29/19 0000 07/09/19 0000  AST 29 21 27 28   --   ALT 87* 54* 51* 56*  --   ALKPHOS 88 81 78 78  --   BILITOT 0.6 0.7 0.5  --   --   PROT 5.7* 5.5* 5.3*  --   --   ALBUMIN 2.7* 2.4* 2.3* 2.6* 2.4*   No results for input(s): LIPASE, AMYLASE in the last 8760 hours. No results for input(s): AMMONIA in the last 8760 hours. CBC: Recent Labs    06/23/19 0315 06/24/19 0417 06/26/19 0302 06/27/19 0346 06/29/19 0000 07/09/19 0000  WBC 9.0 9.5 10.8* 9.0 7.9 3.9  NEUTROABS 8.4* 8.8*  --   --  5,949  --   HGB 11.2* 11.4* 11.9* 12.0 11.8* 8.7*  HCT 36.4 36.7 37.8 36.4 36 27*  MCV 97.3 95.8 96.2 92.2  --   --   PLT 250 256 310 181 252 153   Lipid Panel: Recent Labs    10/06/18 0946 06/19/19 2155  CHOL 149  --   HDL 55.10  --   LDLCALC 76  --   TRIG 89.0 59  CHOLHDL 3  --    Lab Results  Component Value Date   HGBA1C 6.5 (H) 06/27/2019    Procedures since last visit: No results found.  Assessment/Plan Pneumonia due to COVID-19 virus Doing very well.  Only lingering complain of Weakness and SOB on exertion Working with hterapy  Acute on chronic anemia Has acute drop in her Hgb in Acute unit Eliquis dose was reduced Started on Iron Still c/o Some blood when she wipes herself. I di dn to see any Perianal lesions  Will Recheck CBC Continue Iron for now  Atrial fibrillation with RVR (HCC) On Eliquis Follow with Cardiology  Bilateral leg edema Mild worsening On Lasix  I have told her to take extra dose the days when her Edema is worse Repeat Bmp Ted hoses  Essential hypertension On [place] Mixed hyperlipidemia On Lipitor    Labs/tests ordered:  * No order type specified * Next appt:  07/30/2019 Total time spent in  this patient care encounter was  45_  minutes; greater than 50% of the visit spent counseling patient and staff, reviewing records , Labs and coordinating care for problems addressed at this encounter.

## 2019-07-30 ENCOUNTER — Other Ambulatory Visit: Payer: Self-pay

## 2019-07-30 DIAGNOSIS — E782 Mixed hyperlipidemia: Secondary | ICD-10-CM

## 2019-07-30 DIAGNOSIS — D649 Anemia, unspecified: Secondary | ICD-10-CM

## 2019-07-30 LAB — LIPID PANEL
Cholesterol: 146 mg/dL (ref <200)
HDL: 49 mg/dL — ABNORMAL LOW (ref >=50)
LDL Cholesterol (Calc): 77 mg/dL (calc)
Non-HDL Cholesterol (Calc): 97 mg/dL (calc) (ref <130)
Total CHOL/HDL Ratio: 3 (calc) (ref <5.0)
Triglycerides: 110 mg/dL (ref <150)

## 2019-07-30 LAB — COMPLETE METABOLIC PANEL WITH GFR
AG Ratio: 1.4 (calc) (ref 1.0–2.5)
ALT: 17 U/L (ref 6–29)
AST: 18 U/L (ref 10–35)
Albumin: 3.4 g/dL — ABNORMAL LOW (ref 3.6–5.1)
Alkaline phosphatase (APISO): 97 U/L (ref 37–153)
BUN/Creatinine Ratio: 18 (calc) (ref 6–22)
BUN: 18 mg/dL (ref 7–25)
CO2: 28 mmol/L (ref 20–32)
Calcium: 8.6 mg/dL (ref 8.6–10.4)
Chloride: 107 mmol/L (ref 98–110)
Creat: 1.02 mg/dL — ABNORMAL HIGH (ref 0.60–0.88)
GFR, Est African American: 54 mL/min/{1.73_m2} — ABNORMAL LOW (ref >=60)
GFR, Est Non African American: 47 mL/min/{1.73_m2} — ABNORMAL LOW (ref >=60)
Globulin: 2.4 g/dL (calc) (ref 1.9–3.7)
Glucose, Bld: 109 mg/dL — ABNORMAL HIGH (ref 65–99)
Potassium: 3.9 mmol/L (ref 3.5–5.3)
Sodium: 143 mmol/L (ref 135–146)
Total Bilirubin: 0.5 mg/dL (ref 0.2–1.2)
Total Protein: 5.8 g/dL — ABNORMAL LOW (ref 6.1–8.1)

## 2019-07-30 LAB — CBC WITH DIFFERENTIAL/PLATELET
Absolute Monocytes: 392 cells/uL (ref 200–950)
Basophils Absolute: 40 cells/uL (ref 0–200)
Basophils Relative: 0.9 %
Eosinophils Absolute: 79 cells/uL (ref 15–500)
Eosinophils Relative: 1.8 %
HCT: 33.3 % — ABNORMAL LOW (ref 35.0–45.0)
Hemoglobin: 10.7 g/dL — ABNORMAL LOW (ref 11.7–15.5)
Lymphs Abs: 1078 cells/uL (ref 850–3900)
MCH: 30.3 pg (ref 27.0–33.0)
MCHC: 32.1 g/dL (ref 32.0–36.0)
MCV: 94.3 fL (ref 80.0–100.0)
MPV: 10.7 fL (ref 7.5–12.5)
Monocytes Relative: 8.9 %
Neutro Abs: 2812 cells/uL (ref 1500–7800)
Neutrophils Relative %: 63.9 %
Platelets: 195 10*3/uL (ref 140–400)
RBC: 3.53 10*6/uL — ABNORMAL LOW (ref 3.80–5.10)
RDW: 14.1 % (ref 11.0–15.0)
Total Lymphocyte: 24.5 %
WBC: 4.4 10*3/uL (ref 3.8–10.8)

## 2019-08-02 ENCOUNTER — Telehealth: Payer: Self-pay | Admitting: Cardiovascular Disease

## 2019-08-02 ENCOUNTER — Other Ambulatory Visit: Payer: Self-pay | Admitting: Cardiovascular Disease

## 2019-08-02 ENCOUNTER — Non-Acute Institutional Stay: Payer: MEDICARE | Admitting: Nurse Practitioner

## 2019-08-02 ENCOUNTER — Other Ambulatory Visit: Payer: Self-pay

## 2019-08-02 ENCOUNTER — Encounter: Payer: Self-pay | Admitting: Nurse Practitioner

## 2019-08-02 DIAGNOSIS — L03116 Cellulitis of left lower limb: Secondary | ICD-10-CM | POA: Diagnosis not present

## 2019-08-02 DIAGNOSIS — I4891 Unspecified atrial fibrillation: Secondary | ICD-10-CM | POA: Diagnosis not present

## 2019-08-02 DIAGNOSIS — D649 Anemia, unspecified: Secondary | ICD-10-CM

## 2019-08-02 DIAGNOSIS — I5032 Chronic diastolic (congestive) heart failure: Secondary | ICD-10-CM

## 2019-08-02 MED ORDER — FUROSEMIDE 20 MG PO TABS: 40.0000 mg | ORAL_TABLET | Freq: Every day | ORAL | 3 refills | Status: DC

## 2019-08-02 MED ORDER — DOXYCYCLINE HYCLATE 100 MG PO TABS: 100.0000 mg | ORAL_TABLET | Freq: Two times a day (BID) | ORAL | 0 refills | Status: AC

## 2019-08-02 MED ORDER — POTASSIUM CHLORIDE CRYS ER 20 MEQ PO TBCR: 20.0000 meq | EXTENDED_RELEASE_TABLET | Freq: Every day | ORAL | 3 refills | Status: DC

## 2019-08-02 NOTE — Assessment & Plan Note (Signed)
More swelling in BLE, L>R, denied cough, chest pain/pressure, palpitation or SOB. Increase Furosemide 40mg  qd, adding Kcl 46meq qd, repeat BMP prior to the next appointment.

## 2019-08-02 NOTE — Telephone Encounter (Addendum)
LMTCB  Called RE: Eliquis paperwork [phone] and I was advised there is another portion for Dr. Sallyanne Kuster to fill out so they are going to refax the forms to me today and I will leave a month supply at the front office for the pt to pick up to hopefully get them through until it is approved. [place] meyers will send a  letter to Dr. Sallyanne Kuster and to the pt once it is fully approved.   4 boxes of #14 tabs Eliquis 2.5mg   Lot [id number] Exp 10/2020    Addendum:  Still waiting on the "new" fax from [place]: the added info needed to be filled out and signed by Dr. Sallyanne Kuster.

## 2019-08-02 NOTE — Assessment & Plan Note (Addendum)
Heart rate is in control, continue Metoprolol 25mg  qd, Eliquis was resumed 5mg  bid since her Hgb stabilized.

## 2019-08-02 NOTE — Progress Notes (Signed)
Location:   clinic [place]   Place of Service:  Clinic (12) Provider: Marlana Latus NP  Code Status: DNR Goals of Care: [place] Advanced Directives 06/20/2019  Does Patient Have a Medical Advance Directive? No  Type of Advance Directive -  Does patient want to make changes to medical advance directive? -  Copy of [place] in Chart? -  Would patient like information on creating a medical advance directive? No - Patient declined  Pre-existing out of facility DNR order (yellow form or pink MOST form) -     Chief Complaint  Patient presents with  . Acute Visit    Edema in legs    HPI: Patient is a 86 y.o. female seen today for an acute visit for swelling in legs, L>R, the patient stated it has been the worst in her life, she denied SOB, cough, sputum production, PND, or chest pain/pressure/palpitation. Hx of CHF, on Furosemide 20mg  qd. C/o  mid left shin warmth, tenderness, and redness, duration and onset are uncertain. Hx of Afib, on Metoprolol,  Eliquis 5mg  bid since her Hgb 10s now, she takes Fe 3x/wk. COVID PNA 05/2019, fully recovered .  Past Medical History:  Diagnosis Date  . Allergic rhinitis, seasonal   . Breast cancer of upper-outer quadrant of right female breast ([place]) 09/13/2014  . Chronic venous insufficiency   . Dermatophytosis of nail   . Dysrhythmia    H/o Atrial fibrillation  . Edema leg   . Family history of malignant neoplasm of breast   . Family history of malignant neoplasm of ovary   . Goiter    right thyroid nodule   . HTN (hypertension) 08/13/2017  . HX: breast cancer    bilateral  . Menopausal syndrome   . Osteoarthritis   . Osteoporosis   . Squamous cell skin cancer    right lower leg  . Wears glasses   . Wears hearing aid    both ears    Past Surgical History:  Procedure Laterality Date  . bilateral lumpectomies for breast cancer  Aug. 2007  . BREAST LUMPECTOMY WITH RADIOACTIVE SEED LOCALIZATION Right 10/01/2014   Procedure:  BREAST LUMPECTOMY WITH RADIOACTIVE SEED LOCALIZATION;  Surgeon: Erroll Luna, MD;  Location: [place];  Service: General;  Laterality: Right;  . BREAST LUMPECTOMY WITH RADIOACTIVE SEED LOCALIZATION Left 03/22/2019   Procedure: LEFT BREAST LUMPECTOMY WITH RADIOACTIVE SEED LOCALIZATION;  Surgeon: Erroll Luna, MD;  Location: [place];  Service: General;  Laterality: Left;  . CATARACT EXTRACTION    . CHOLECYSTECTOMY N/A 08/13/2017   Procedure: LAPAROSCOPIC CHOLECYSTECTOMY WITH INTRAOPERATIVE CHOLANGIOGRAM;  Surgeon: Excell Seltzer, MD;  Location: [facility];  Service: General;  Laterality: N/A;  . DILATION AND CURETTAGE OF UTERUS    . ESOPHAGOGASTRODUODENOSCOPY  07/26/2011   Procedure: ESOPHAGOGASTRODUODENOSCOPY (EGD);  Surgeon: Jeryl Columbia, MD;  Location: [facility];  Service: Endoscopy;  Laterality: N/A;  . history of lumbar compression fracture    . left eardum sx  1983  . left knee arthroscopic surgery    . no screening colonoscopy    . s/p bilateral lumpectomies    . SAVORY DILATION  07/26/2011   Procedure: SAVORY DILATION;  Surgeon: Jeryl Columbia, MD;  [facility];  Service: Endoscopy;  Laterality: N/A;  . status post resection squamous cell cancer right lower leg    . TONSILLECTOMY    . UMBILICAL HERNIA REPAIR      Allergies  Allergen Reactions  . Celecoxib Swelling  Swelling-leg    Allergies as of 08/02/2019      Reactions   Celecoxib Swelling   Swelling-leg      Medication List       Accurate as of August 02, 2019 11:59 PM. If you have any questions, ask your nurse or doctor.        acetaminophen 325 MG tablet Commonly known as: TYLENOL Take 2 tablets (650 mg total) by mouth every 6 (six) hours as needed for mild pain (or Fever >/= 101).   atorvastatin 40 MG tablet Commonly known as: LIPITOR TAKE 1 TABLET BY MOUTH EVERY DAY   doxycycline 100 MG tablet Commonly known as: VIBRA-TABS Take 1 tablet (100 mg total) by mouth 2 (two) times  daily for 7 days. Started by: Alexes Menchaca X Courtland [name], NP   Eliquis 5 MG Tabs tablet Generic drug: apixaban Take 5 mg by mouth 2 (two) times daily. What changed: Another medication with the same name was changed. Make sure you understand how and when to take each. Changed by: Sanda Klein, MD   Eliquis 5 MG Tabs tablet Generic drug: apixaban TAKE 1 TABLET BY MOUTH TWICE A DAY What changed: medication strength Changed by: Sanda Klein, MD   ferrous sulfate 325 (65 FE) MG tablet Take 325 mg by mouth. once day with meals 3x /week.   furosemide 20 MG tablet Commonly known as: LASIX Take 2 tablets (40 mg total) by mouth daily.   ipratropium 0.06 % nasal spray Commonly known as: ATROVENT PLACE 2 SPRAYS INTO BOTH NOSTRILS 4 (FOUR) TIMES DAILY.   metoprolol succinate 25 MG 24 hr tablet Commonly known as: TOPROL-XL Take 25 mg by mouth at bedtime.   montelukast 10 MG tablet Commonly known as: SINGULAIR TAKE 1 TABLET BY MOUTH EVERYDAY AT BEDTIME What changed: See the new instructions.   pantoprazole 40 MG tablet Commonly known as: PROTONIX Take 40 mg by mouth daily.   potassium chloride SA 20 MEQ tablet Commonly known as: KLOR-CON Take 1 tablet (20 mEq total) by mouth daily. Started by: Jabir Dahlem X Georg Ang, NP   PreserVision AREDS 2 Caps Take 1 capsule by mouth 2 (two) times daily.   SYSTANE OP Place 1 drop into both eyes 2 (two) times daily as needed (dry eyes).       Review of Systems:  Review of Systems  Constitutional: Positive for unexpected weight change. Negative for activity change, appetite change, chills, diaphoresis, fatigue and fever.       Weight gained about #5Ibs in the past 4-6 weeks.   HENT: Positive for hearing loss. Negative for congestion and voice change.   Eyes: Negative for visual disturbance.  Respiratory: Negative for cough, shortness of breath and wheezing.   Cardiovascular: Positive for leg swelling. Negative for chest pain and palpitations.    Gastrointestinal: Negative for abdominal distention, abdominal pain, constipation, diarrhea, nausea and vomiting.  Genitourinary: Negative for difficulty urinating, dysuria and urgency.  Musculoskeletal: Positive for gait problem.  Skin: Positive for color change.       Redness, warmth, tenderness, swelling left mid shin.  Neurological: Negative for dizziness, speech difficulty, weakness and headaches.  Psychiatric/Behavioral: Negative for agitation, behavioral problems, hallucinations and sleep disturbance. The patient is not nervous/anxious.     Health Maintenance  Topic Date Due  . MAMMOGRAM  07/25/2019  . TETANUS/TDAP  05/04/2023  . INFLUENZA VACCINE  Completed  . DEXA SCAN  Completed  . PNA vac Low Risk Adult  Completed    Physical Exam: Vitals:  08/02/19 1437  BP: (!) 116/52  Pulse: 78  Temp: (!) 97.5 F (36.4 C)  SpO2: 97%  Weight: 165 lb 12.8 oz (75.2 kg)  Height: 5\' 3"  (1.6 m)   Body mass index is 29.37 kg/m. Physical Exam Vitals and nursing note reviewed.  Constitutional:      General: She is not in acute distress.    Appearance: Normal appearance. She is not ill-appearing, toxic-appearing or diaphoretic.  HENT:     Head: Normocephalic and atraumatic.     Nose: Nose normal.     Mouth/Throat:     Mouth: Mucous membranes are moist.  Eyes:     Extraocular Movements: Extraocular movements intact.     Conjunctiva/sclera: Conjunctivae normal.     Pupils: Pupils are equal, round, and reactive to light.  Cardiovascular:     Rate and Rhythm: Normal rate. Rhythm irregular.  Pulmonary:     Effort: Pulmonary effort is normal.     Breath sounds: No wheezing, rhonchi or rales.  Abdominal:     General: Bowel sounds are normal. There is no distension.     Palpations: Abdomen is soft.     Tenderness: There is no abdominal tenderness. There is no right CVA tenderness, left CVA tenderness, guarding or rebound.  Musculoskeletal:     Cervical back: Normal range of  motion and neck supple.     Right lower leg: Edema present.     Left lower leg: Edema present.     Comments: 2+ edema BLE  Skin:    General: Skin is warm and dry.     Findings: Erythema present.     Comments: Redness, warmth, tenderness, swelling left mid shin.   Neurological:     General: No focal deficit present.     Mental Status: She is alert and oriented to person, place, and time. Mental status is at baseline.     Motor: No weakness.     Coordination: Coordination normal.     Gait: Gait abnormal.  Psychiatric:        Mood and Affect: Mood normal.        Behavior: Behavior normal.        Thought Content: Thought content normal.        Judgment: Judgment normal.     Labs reviewed: Basic Metabolic Panel: Recent Labs    10/06/18 0946 10/18/18 1351 06/22/19 0343 06/22/19 0343 06/23/19 0315 06/23/19 0315 06/24/19 0417 06/24/19 0417 06/26/19 0302 06/26/19 0302 06/27/19 0346 06/27/19 0346 06/29/19 0000 07/09/19 0000 07/30/19 0853  NA 142   < > 140   < > 142   < > 142   < > 136   < > 135  --  140 141 143  K 4.6   < > 4.1   < > 4.5   < > 4.6   < > 5.2*   < > 5.2*   < > 4.5 4.2 3.9  CL 104   < > 108   < > 108   < > 106   < > 100   < > 100   < > 103 111* 107  CO2 29   < > 21*   < > 22   < > 25   < > 25   < > 25   < > 29* 24* 28  GLUCOSE 104*   < > 189*   < > 206*   < > 240*   < > 192*  --  198*  --   --   --  109*  BUN 32*   < > 35*   < > 36*   < > 36*   < > 43*   < > 46*  --  38* 14 18  CREATININE 1.32*   < > 0.90   < > 0.95   < > 0.97   < > 0.89   < > 0.93  --  1.0 0.7 1.02*  CALCIUM 9.0   < > 8.2*   < > 8.6*   < > 9.0   < > 8.9   < > 8.8*   < > 8.0* 7.7* 8.6  MG  --    < > 2.0   < > 2.2   < > 2.1  --  1.9  --   --   --  1.9  --   --   PHOS  --    < > 2.8  --  2.9  --  3.4  --   --   --   --   --   --   --   --   TSH 4.04  --   --   --   --   --   --   --   --   --   --   --   --   --   --    < > = values in this interval not displayed.   Liver Function  Tests: Recent Labs    06/26/19 0302 06/26/19 0302 06/27/19 0346 06/29/19 0000 07/09/19 0000 07/30/19 0853  AST 21   < > 27 28  --  18  ALT 54*   < > 51* 56*  --  17  ALKPHOS 81  --  78 78  --   --   BILITOT 0.7  --  0.5  --   --  0.5  PROT 5.5*  --  5.3*  --   --  5.8*  ALBUMIN 2.4*   < > 2.3* 2.6* 2.4*  --    < > = values in this interval not displayed.   No results for input(s): LIPASE, AMYLASE in the last 8760 hours. No results for input(s): AMMONIA in the last 8760 hours. CBC: Recent Labs    06/24/19 0417 06/24/19 0417 06/26/19 0302 06/26/19 0302 06/27/19 0346 06/27/19 0346 06/29/19 0000 07/09/19 0000 07/30/19 0853  WBC 9.5   < > 10.8*   < > 9.0  --  7.9 3.9 4.4  NEUTROABS 8.8*  --   --   --   --   --  5,949  --  2,812  HGB 11.4*   < > 11.9*   < > 12.0   < > 11.8* 8.7* 10.7*  HCT 36.7   < > 37.8   < > 36.4   < > 36 27* 33.3*  MCV 95.8   < > 96.2  --  92.2  --   --   --  94.3  PLT 256   < > 310   < > 181   < > 252 153 195   < > = values in this interval not displayed.   Lipid Panel: Recent Labs    10/06/18 0946 06/19/19 2155 07/30/19 0853  CHOL 149  --  146  HDL 55.10  --  49*  LDLCALC 76  --  77  TRIG 89.0 59 110  CHOLHDL 3  --  3.0   Lab Results  Component Value Date   HGBA1C 6.5 (H)  06/27/2019    Procedures since last visit: No results found.  Assessment/Plan Cellulitis of left anterior lower leg Warmth, redness, tenderness, swelling anterior mid left shin, will start Doxycycline 100mg  bid x 7 days. Observe.   Chronic diastolic heart failure ([place]) More swelling in BLE, L>R, denied cough, chest pain/pressure, palpitation or SOB. Increase Furosemide 40mg  qd, adding Kcl 30meq qd, repeat BMP prior to the next appointment.   Atrial fibrillation with RVR (HCC) Heart rate is in control, continue Metoprolol 25mg  qd, Eliquis was resumed 5mg  bid since her Hgb stabilized.   Acute on chronic anemia Stabilized, Hgb 10s now, continue Fe  3x/wk    Labs/tests ordered:  BMP  Next appt:  08/22/2019

## 2019-08-02 NOTE — Assessment & Plan Note (Signed)
Warmth, redness, tenderness, swelling anterior mid left shin, will start Doxycycline 100mg  bid x 7 days. Observe.

## 2019-08-02 NOTE — Telephone Encounter (Signed)
New Message    Pt c/o medication issue:  1. Name of Medication: Eliquis  5mg    2. How are you currently taking this medication (dosage and times per day)?  Pts husband says pt takes 5mg  tablet   3. Are you having a reaction (difficulty breathing--STAT)? No   4. What is your medication issue? Pts husband is calling and says they have to pay over $200 for the medication, He says they already filled out the information for the [facility] application. He says he needs someone to contact the pharmacy with the information    Please advise

## 2019-08-02 NOTE — Assessment & Plan Note (Signed)
Stabilized, Hgb 10s now, continue Fe 3x/wk

## 2019-08-02 NOTE — Patient Instructions (Signed)
Will increase Furosemide to 40mg  by mouth daily for increased swelling in legs, adding potassium 43meq by mouth daily, will repeat BMP prior to the next appointment. Will start Doxycycline 100mg  by mouth two time a day for 7 days for the right mid shin cellulitis.

## 2019-08-03 ENCOUNTER — Encounter: Payer: Self-pay | Admitting: Nurse Practitioner

## 2019-08-06 ENCOUNTER — Other Ambulatory Visit: Payer: Self-pay | Admitting: *Deleted

## 2019-08-06 MED ORDER — PANTOPRAZOLE SODIUM 40 MG PO TBEC: 40.0000 mg | DELAYED_RELEASE_TABLET | Freq: Every day | ORAL | 3 refills | Status: DC

## 2019-08-06 NOTE — Telephone Encounter (Signed)
Patient requesting refill. Phoned to pharmacy

## 2019-08-14 NOTE — Telephone Encounter (Signed)
Assistance has been refaxed per the request of [employer].

## 2019-08-15 ENCOUNTER — Telehealth: Payer: Self-pay | Admitting: Cardiovascular Disease

## 2019-08-15 MED ORDER — APIXABAN 5 MG PO TABS: 5.0000 mg | ORAL_TABLET | Freq: Two times a day (BID) | ORAL | 0 refills | Status: DC

## 2019-08-15 NOTE — Telephone Encounter (Signed)
New Message  Pt's husband called and wanted to speak with Dr. Victorino December nurse Kathryne Sharper. Would not give out any details as to why.  Please call to discuss

## 2019-08-15 NOTE — Telephone Encounter (Signed)
Spoke with Mr. Felt. He stated that in order for Mrs. Leaver to get assistance, he would need to  pay $227 out of pocket. He has asked that Eliquis, a 2 week supply, be sent in for her.

## 2019-08-16 ENCOUNTER — Other Ambulatory Visit: Payer: Self-pay

## 2019-08-16 DIAGNOSIS — I5032 Chronic diastolic (congestive) heart failure: Secondary | ICD-10-CM

## 2019-08-16 LAB — BASIC METABOLIC PANEL WITH GFR
BUN/Creatinine Ratio: 29 (calc) — ABNORMAL HIGH (ref 6–22)
BUN: 33 mg/dL — ABNORMAL HIGH (ref 7–25)
CO2: 26 mmol/L (ref 20–32)
Calcium: 8.1 mg/dL — ABNORMAL LOW (ref 8.6–10.4)
Chloride: 108 mmol/L (ref 98–110)
Creat: 1.12 mg/dL — ABNORMAL HIGH (ref 0.60–0.88)
GFR, Est African American: 48 mL/min/{1.73_m2} — ABNORMAL LOW (ref >=60)
GFR, Est Non African American: 42 mL/min/{1.73_m2} — ABNORMAL LOW (ref >=60)
Glucose, Bld: 110 mg/dL — ABNORMAL HIGH (ref 65–99)
Potassium: 4 mmol/L (ref 3.5–5.3)
Sodium: 143 mmol/L (ref 135–146)

## 2019-08-20 DIAGNOSIS — Z23 Encounter for immunization: Secondary | ICD-10-CM | POA: Diagnosis not present

## 2019-08-21 ENCOUNTER — Other Ambulatory Visit: Payer: Self-pay | Admitting: Cardiovascular Disease

## 2019-08-21 NOTE — Telephone Encounter (Signed)
Rx has been sent to the pharmacy electronically. ° °

## 2019-08-22 ENCOUNTER — Other Ambulatory Visit: Payer: Self-pay

## 2019-08-22 ENCOUNTER — Non-Acute Institutional Stay: Payer: MEDICARE | Admitting: Internal Medicine

## 2019-08-22 ENCOUNTER — Encounter: Payer: Self-pay | Admitting: Internal Medicine

## 2019-08-22 VITALS — BP 112/52 | HR 89 | Temp 98.4°F | Ht 63.0 in | Wt 164.0 lb

## 2019-08-22 DIAGNOSIS — D649 Anemia, unspecified: Secondary | ICD-10-CM | POA: Diagnosis not present

## 2019-08-22 DIAGNOSIS — I4891 Unspecified atrial fibrillation: Secondary | ICD-10-CM

## 2019-08-22 DIAGNOSIS — R600 Localized edema: Secondary | ICD-10-CM | POA: Diagnosis not present

## 2019-08-22 DIAGNOSIS — I5032 Chronic diastolic (congestive) heart failure: Secondary | ICD-10-CM | POA: Diagnosis not present

## 2019-08-22 DIAGNOSIS — U071 COVID-19: Secondary | ICD-10-CM

## 2019-08-22 DIAGNOSIS — E782 Mixed hyperlipidemia: Secondary | ICD-10-CM

## 2019-08-22 DIAGNOSIS — J1282 Pneumonia due to coronavirus disease 2019: Secondary | ICD-10-CM

## 2019-08-22 NOTE — Progress Notes (Signed)
Location: [place] of Service:  Clinic (12)  Provider:   Code Status:  Goals of Care:  Advanced Directives 06/20/2019  Does Patient Have a Medical Advance Directive? No  Type of Advance Directive -  Does patient want to make changes to medical advance directive? -  Copy of [place] in Chart? -  Would patient like information on creating a medical advance directive? No - Patient declined  Pre-existing out of facility DNR order (yellow form or pink MOST form) -     Chief Complaint  Patient presents with  . Medical Management of Chronic Issues    4 week follow up. Patient would like to request a hearing test and lasix management.   . Health Maintenance    Mammogram    HPI: Patient is a 86 y.o. female seen today for an acute visit for Follow up of her LE edema and Cellulitis  Patient has a history of breast cancer s/p lumpectomy, A. fib on chronic Eliquis, coronary artery disease, hypertension, hyperlipidemia, LE edema Admitted inhospital from 12/1-12/9 forCovid Pneumoniatreated with 5 days of remdesivir and 10 days of Decadron.  Her Active problems Anemia with rectal Bleed On Iron. Was Occult positive before when in SNF No More obvious bleeding since then. Does not want detail work up right now LE edema with Cellulitis Treated with Doxycyline Much better on higher dose of lasix Wants to know if she can continue it Covid Positive Pneumonia Doing well. Appetite back to normal Still weak. No SOB PAF Back on her Eliquis Has Follow up with Cardiology  [facility] Wants hearing test Approved  Lives with her Husband. Walks with walker. No recent Falls   Past Medical History:  Diagnosis Date  . Allergic rhinitis, seasonal   . Breast cancer of upper-outer quadrant of right female breast ([place]) 09/13/2014  . Chronic venous insufficiency   . Dermatophytosis of nail   . Dysrhythmia    H/o Atrial fibrillation  . Edema leg   . Family  history of malignant neoplasm of breast   . Family history of malignant neoplasm of ovary   . Goiter    right thyroid nodule   . HTN (hypertension) 08/13/2017  . HX: breast cancer    bilateral  . Menopausal syndrome   . Osteoarthritis   . Osteoporosis   . Squamous cell skin cancer    right lower leg  . Wears glasses   . Wears hearing aid    both ears    Past Surgical History:  Procedure Laterality Date  . bilateral lumpectomies for breast cancer  Aug. 2007  . BREAST LUMPECTOMY WITH RADIOACTIVE SEED LOCALIZATION Right 10/01/2014   Procedure: BREAST LUMPECTOMY WITH RADIOACTIVE SEED LOCALIZATION;  Surgeon: Erroll Luna, MD;  Location: [place];  Service: General;  Laterality: Right;  . BREAST LUMPECTOMY WITH RADIOACTIVE SEED LOCALIZATION Left 03/22/2019   Procedure: LEFT BREAST LUMPECTOMY WITH RADIOACTIVE SEED LOCALIZATION;  Surgeon: Erroll Luna, MD;  Location: [place];  Service: General;  Laterality: Left;  . CATARACT EXTRACTION    . CHOLECYSTECTOMY N/A 08/13/2017   Procedure: LAPAROSCOPIC CHOLECYSTECTOMY WITH INTRAOPERATIVE CHOLANGIOGRAM;  Surgeon: Excell Seltzer, MD;  [facility];  Service: General;  Laterality: N/A;  . DILATION AND CURETTAGE OF UTERUS    . ESOPHAGOGASTRODUODENOSCOPY  07/26/2011   Procedure: ESOPHAGOGASTRODUODENOSCOPY (EGD);  Surgeon: Jeryl Columbia, MD;  Location[facility];  Service: Endoscopy;  Laterality: N/A;  . history of lumbar compression fracture    .  left eardum sx  1983  . left knee arthroscopic surgery    . no screening colonoscopy    . s/p bilateral lumpectomies    . SAVORY DILATION  07/26/2011   Procedure: SAVORY DILATION;  Surgeon: Jeryl Columbia, MD;  [facility];  Service: Endoscopy;  Laterality: N/A;  . status post resection squamous cell cancer right lower leg    . TONSILLECTOMY    . UMBILICAL HERNIA REPAIR      Allergies  Allergen Reactions  . Celecoxib Swelling    Swelling-leg    Outpatient Encounter  Medications as of 08/22/2019  Medication Sig  . acetaminophen (TYLENOL) 325 MG tablet Take 2 tablets (650 mg total) by mouth every 6 (six) hours as needed for mild pain (or Fever >/= 101).  Marland Kitchen atorvastatin (LIPITOR) 40 MG tablet Take 1 tablet (40 mg total) by mouth daily.  Marland Kitchen ELIQUIS 5 MG TABS tablet TAKE 1 TABLET BY MOUTH TWICE A DAY  . ferrous sulfate 325 (65 FE) MG tablet Take 325 mg by mouth. once day with meals 3x /week.  . furosemide (LASIX) 20 MG tablet Take 40 mg by mouth.  Marland Kitchen ipratropium (ATROVENT) 0.06 % nasal spray PLACE 2 SPRAYS INTO BOTH NOSTRILS 4 (FOUR) TIMES DAILY.  . metoprolol succinate (TOPROL-XL) 25 MG 24 hr tablet Take 25 mg by mouth at bedtime.  . montelukast (SINGULAIR) 10 MG tablet TAKE 1 TABLET BY MOUTH EVERYDAY AT BEDTIME (Patient taking differently: Take 10 mg by mouth at bedtime. )  . Multiple Vitamins-Minerals (PRESERVISION AREDS 2) CAPS Take 1 capsule by mouth 2 (two) times daily.   . pantoprazole (PROTONIX) 40 MG tablet Take 1 tablet (40 mg total) by mouth daily.  Vladimir Faster Glycol-Propyl Glycol (SYSTANE OP) Place 1 drop into both eyes 2 (two) times daily as needed (dry eyes).  . potassium chloride SA (KLOR-CON) 20 MEQ tablet Take 1 tablet (20 mEq total) by mouth daily.  . [DISCONTINUED] apixaban (ELIQUIS) 5 MG TABS tablet Take 1 tablet (5 mg total) by mouth 2 (two) times daily.  . [DISCONTINUED] furosemide (LASIX) 20 MG tablet TAKE 1 TABLET BY MOUTH EVERY DAY (Patient taking differently: 40 mg. )   No facility-administered encounter medications on file as of 08/22/2019.    Review of Systems:  Review of Systems  Constitutional: Negative.   HENT: Negative.   Respiratory: Negative.   Cardiovascular: Positive for leg swelling.  Gastrointestinal: Negative.   Genitourinary: Negative.   Musculoskeletal: Negative.   Neurological: Positive for weakness.  Psychiatric/Behavioral: Negative.   All other systems reviewed and are negative.   Health Maintenance  Topic  Date Due  . MAMMOGRAM  07/25/2019  . TETANUS/TDAP  05/04/2023  . INFLUENZA VACCINE  Completed  . DEXA SCAN  Completed  . PNA vac Low Risk Adult  Completed    Physical Exam: Vitals:   08/22/19 1327  BP: (!) 112/52  Pulse: 89  Temp: 98.4 F (36.9 C)  SpO2: 96%  Weight: 164 lb (74.4 kg)  Height: 5\' 3"  (1.6 m)   Body mass index is 29.05 kg/m. Physical Exam Vitals reviewed.  Constitutional:      Appearance: Normal appearance.  HENT:     Head: Normocephalic.     Ears:     Comments: Some wax in Left ear. Right was Clean    Nose: Nose normal.     Mouth/Throat:     Mouth: Mucous membranes are moist.     Pharynx: Oropharynx is clear.  Eyes:  Pupils: Pupils are equal, round, and reactive to light.  Cardiovascular:     Rate and Rhythm: Normal rate.     Pulses: Normal pulses.  Pulmonary:     Effort: Pulmonary effort is normal. No respiratory distress.     Breath sounds: No wheezing or rales.  Abdominal:     General: Abdomen is flat. Bowel sounds are normal.     Palpations: Abdomen is soft.  Musculoskeletal:     Comments: Mild edema Bilateral Does have Chronic Venous Changes  Skin:    General: Skin is warm.  Neurological:     General: No focal deficit present.     Mental Status: She is alert and oriented to person, place, and time.  Psychiatric:        Mood and Affect: Mood normal.     Labs reviewed: Basic Metabolic Panel: Recent Labs    10/06/18 0946 10/18/18 1351 06/22/19 0343 06/22/19 0343 06/23/19 0315 06/23/19 0315 06/24/19 0417 06/24/19 0417 06/26/19 0302 06/26/19 0302 06/27/19 0346 06/27/19 0346 06/29/19 0000 06/29/19 0000 07/09/19 0000 07/30/19 0853 08/16/19 0000  NA 142   < > 140   < > 142   < > 142   < > 136   < > 135  --  140   < > 141 143 143  K 4.6   < > 4.1   < > 4.5   < > 4.6   < > 5.2*   < > 5.2*   < > 4.5   < > 4.2 3.9 4.0  CL 104   < > 108   < > 108   < > 106   < > 100   < > 100   < > 103   < > 111* 107 108  CO2 29   < > 21*   <  > 22   < > 25   < > 25   < > 25   < > 29*   < > 24* 28 26  GLUCOSE 104*   < > 189*   < > 206*   < > 240*   < > 192*   < > 198*  --   --   --   --  109* 110*  BUN 32*   < > 35*   < > 36*   < > 36*   < > 43*   < > 46*  --  38*   < > 14 18 33*  CREATININE 1.32*   < > 0.90   < > 0.95   < > 0.97   < > 0.89   < > 0.93  --  1.0   < > 0.7 1.02* 1.12*  CALCIUM 9.0   < > 8.2*   < > 8.6*   < > 9.0   < > 8.9   < > 8.8*   < > 8.0*   < > 7.7* 8.6 8.1*  MG  --    < > 2.0   < > 2.2   < > 2.1  --  1.9  --   --   --  1.9  --   --   --   --   PHOS  --    < > 2.8  --  2.9  --  3.4  --   --   --   --   --   --   --   --   --   --  TSH 4.04  --   --   --   --   --   --   --   --   --   --   --   --   --   --   --   --    < > = values in this interval not displayed.   Liver Function Tests: Recent Labs    06/26/19 0302 06/26/19 0302 06/27/19 0346 06/29/19 0000 07/09/19 0000 07/30/19 0853  AST 21   < > 27 28  --  18  ALT 54*   < > 51* 56*  --  17  ALKPHOS 81  --  78 78  --   --   BILITOT 0.7  --  0.5  --   --  0.5  PROT 5.5*  --  5.3*  --   --  5.8*  ALBUMIN 2.4*   < > 2.3* 2.6* 2.4*  --    < > = values in this interval not displayed.   No results for input(s): LIPASE, AMYLASE in the last 8760 hours. No results for input(s): AMMONIA in the last 8760 hours. CBC: Recent Labs    06/24/19 0417 06/24/19 0417 06/26/19 0302 06/26/19 0302 06/27/19 0346 06/27/19 0346 06/29/19 0000 07/09/19 0000 07/30/19 0853  WBC 9.5   < > 10.8*   < > 9.0  --  7.9 3.9 4.4  NEUTROABS 8.8*  --   --   --   --   --  5,949  --  2,812  HGB 11.4*   < > 11.9*   < > 12.0   < > 11.8* 8.7* 10.7*  HCT 36.7   < > 37.8   < > 36.4   < > 36 27* 33.3*  MCV 95.8   < > 96.2  --  92.2  --   --   --  94.3  PLT 256   < > 310   < > 181   < > 252 153 195   < > = values in this interval not displayed.   Lipid Panel: Recent Labs    10/06/18 0946 06/19/19 2155 07/30/19 0853  CHOL 149  --  146  HDL 55.10  --  49*  LDLCALC 76  --  77    TRIG 89.0 59 110  CHOLHDL 3  --  3.0   Lab Results  Component Value Date   HGBA1C 6.5 (H) 06/27/2019    Procedures since last visit: No results found.  Assessment/Plan  Atrial fibrillation with RVR (HCC) Rate Control on metoprolol On Eliquis Follows with Cardiology  Bilateral leg edema Doing well on Lasix 40 mg  Bun and creat slightly high but will follow  Acute on chronic anemia On Iron Does not want to see GI right  Now Will let me know if notices any Blood in stool Pneumonia due to COVID-19 virus Doing well. Got First  shot of Covid  Mixed hyperlipidemia On Lipitor LDL 92 HOH Will approve referal for assesemnet  DEXA Scan  Tscore -1.9   Labs/tests ordered:  * No order type specified * Next appt:  11/05/2019

## 2019-08-22 NOTE — Telephone Encounter (Signed)
Assistance for Eliquis has been approved through 07/18/2020

## 2019-08-23 DIAGNOSIS — R2689 Other abnormalities of gait and mobility: Secondary | ICD-10-CM | POA: Diagnosis not present

## 2019-08-23 DIAGNOSIS — M6281 Muscle weakness (generalized): Secondary | ICD-10-CM | POA: Diagnosis not present

## 2019-08-23 DIAGNOSIS — R2681 Unsteadiness on feet: Secondary | ICD-10-CM | POA: Diagnosis not present

## 2019-08-23 DIAGNOSIS — J129 Viral pneumonia, unspecified: Secondary | ICD-10-CM | POA: Diagnosis not present

## 2019-08-23 DIAGNOSIS — U071 COVID-19: Secondary | ICD-10-CM | POA: Diagnosis not present

## 2019-08-23 DIAGNOSIS — I4891 Unspecified atrial fibrillation: Secondary | ICD-10-CM | POA: Diagnosis not present

## 2019-08-28 ENCOUNTER — Ambulatory Visit (INDEPENDENT_AMBULATORY_CARE_PROVIDER_SITE_OTHER): Payer: MEDICARE | Admitting: Podiatry

## 2019-08-28 ENCOUNTER — Other Ambulatory Visit: Payer: Self-pay

## 2019-08-28 ENCOUNTER — Encounter: Payer: Self-pay | Admitting: Podiatry

## 2019-08-28 DIAGNOSIS — B351 Tinea unguium: Secondary | ICD-10-CM

## 2019-08-28 DIAGNOSIS — M6281 Muscle weakness (generalized): Secondary | ICD-10-CM | POA: Diagnosis not present

## 2019-08-28 DIAGNOSIS — I4891 Unspecified atrial fibrillation: Secondary | ICD-10-CM | POA: Diagnosis not present

## 2019-08-28 DIAGNOSIS — Q828 Other specified congenital malformations of skin: Secondary | ICD-10-CM | POA: Diagnosis not present

## 2019-08-28 DIAGNOSIS — R2689 Other abnormalities of gait and mobility: Secondary | ICD-10-CM | POA: Diagnosis not present

## 2019-08-28 DIAGNOSIS — J129 Viral pneumonia, unspecified: Secondary | ICD-10-CM | POA: Diagnosis not present

## 2019-08-28 DIAGNOSIS — M79676 Pain in unspecified toe(s): Secondary | ICD-10-CM

## 2019-08-28 DIAGNOSIS — D689 Coagulation defect, unspecified: Secondary | ICD-10-CM

## 2019-08-28 DIAGNOSIS — U071 COVID-19: Secondary | ICD-10-CM | POA: Diagnosis not present

## 2019-08-28 DIAGNOSIS — R2681 Unsteadiness on feet: Secondary | ICD-10-CM | POA: Diagnosis not present

## 2019-08-28 NOTE — Progress Notes (Signed)
She presents today recently recovered from COVID-19 with a chief concern of painful elongated toenails and corns and calluses.  Objective: Vital signs are stable alert and oriented x3.  Pulses are palpable.  Toenails are long thick yellow dystrophic-like mycotic fat atrophy plantar aspect of the bilateral foot resulting in metatarsalgia and reactive hyperkeratotic tissue subthird metatarsal of the right foot.  Assessment: Pain in limb secondary to onychomycosis and porokeratosis.  Plan: Debridement of all reactive hyperkeratotic tissue debridement of toenails bilaterally.  Follow-up with her in 2 to 3 months

## 2019-08-30 DIAGNOSIS — H524 Presbyopia: Secondary | ICD-10-CM | POA: Diagnosis not present

## 2019-08-30 DIAGNOSIS — H353132 Nonexudative age-related macular degeneration, bilateral, intermediate dry stage: Secondary | ICD-10-CM | POA: Diagnosis not present

## 2019-08-30 DIAGNOSIS — H04123 Dry eye syndrome of bilateral lacrimal glands: Secondary | ICD-10-CM | POA: Diagnosis not present

## 2019-08-30 DIAGNOSIS — Z961 Presence of intraocular lens: Secondary | ICD-10-CM | POA: Diagnosis not present

## 2019-09-03 ENCOUNTER — Other Ambulatory Visit: Payer: Self-pay | Admitting: Cardiovascular Disease

## 2019-09-03 ENCOUNTER — Other Ambulatory Visit: Payer: Self-pay | Admitting: Allergy and Immunology

## 2019-09-03 DIAGNOSIS — J129 Viral pneumonia, unspecified: Secondary | ICD-10-CM | POA: Diagnosis not present

## 2019-09-03 DIAGNOSIS — U071 COVID-19: Secondary | ICD-10-CM | POA: Diagnosis not present

## 2019-09-03 DIAGNOSIS — R2681 Unsteadiness on feet: Secondary | ICD-10-CM | POA: Diagnosis not present

## 2019-09-03 DIAGNOSIS — M6281 Muscle weakness (generalized): Secondary | ICD-10-CM | POA: Diagnosis not present

## 2019-09-03 DIAGNOSIS — I4891 Unspecified atrial fibrillation: Secondary | ICD-10-CM | POA: Diagnosis not present

## 2019-09-03 DIAGNOSIS — R2689 Other abnormalities of gait and mobility: Secondary | ICD-10-CM | POA: Diagnosis not present

## 2019-09-04 ENCOUNTER — Encounter: Payer: Self-pay | Admitting: Cardiovascular Disease

## 2019-09-04 ENCOUNTER — Ambulatory Visit: Payer: MEDICARE | Admitting: Podiatry

## 2019-09-04 ENCOUNTER — Telehealth (INDEPENDENT_AMBULATORY_CARE_PROVIDER_SITE_OTHER): Payer: MEDICARE | Admitting: Cardiovascular Disease

## 2019-09-04 DIAGNOSIS — U071 COVID-19: Secondary | ICD-10-CM | POA: Diagnosis not present

## 2019-09-04 DIAGNOSIS — E7800 Pure hypercholesterolemia, unspecified: Secondary | ICD-10-CM | POA: Diagnosis not present

## 2019-09-04 DIAGNOSIS — I4891 Unspecified atrial fibrillation: Secondary | ICD-10-CM | POA: Diagnosis not present

## 2019-09-04 DIAGNOSIS — R2681 Unsteadiness on feet: Secondary | ICD-10-CM | POA: Diagnosis not present

## 2019-09-04 DIAGNOSIS — J129 Viral pneumonia, unspecified: Secondary | ICD-10-CM | POA: Diagnosis not present

## 2019-09-04 DIAGNOSIS — Z7901 Long term (current) use of anticoagulants: Secondary | ICD-10-CM | POA: Diagnosis not present

## 2019-09-04 DIAGNOSIS — R2689 Other abnormalities of gait and mobility: Secondary | ICD-10-CM | POA: Diagnosis not present

## 2019-09-04 DIAGNOSIS — I480 Paroxysmal atrial fibrillation: Secondary | ICD-10-CM

## 2019-09-04 DIAGNOSIS — M6281 Muscle weakness (generalized): Secondary | ICD-10-CM | POA: Diagnosis not present

## 2019-09-04 NOTE — Progress Notes (Signed)
Virtual Visit via Telephone Note   This visit type was conducted due to national recommendations for restrictions regarding the COVID-19 Pandemic (e.g. social distancing) in an effort to limit this patient's exposure and mitigate transmission in our community.  Due to her co-morbid illnesses, this patient is at least at moderate risk for complications without adequate follow up.  This format is felt to be most appropriate for this patient at this time.  The patient did not have access to video technology/had technical difficulties with video requiring transitioning to audio format only (telephone).  All issues noted in this document were discussed and addressed.  No physical exam could be performed with this format.  Please refer to the patient's chart for her  consent to telehealth for [facility].   Date:  09/04/2019   ID:  [name], DOB 04/04/1922, MRN [id number]  Patient Location: Home Provider Location: Office  PCP:  Virgie Dad, MD  Cardiologist:  Sanda Klein, MD  Electrophysiologist:  None   Evaluation Performed:  Follow-Up Visit  Chief Complaint: Atrial fibrillation  History of Present Illness:    [name] is a 86 y.o. female with history of paroxysmal atrial fibrillation, complicated by non-STEMI and transient congestive heart failure in the setting of cholecystectomy in January 2019.  Both she and her husband have recovered from COVID-19 infection in late 2020.  She was sicker that [name] and required hospitalization for oxygen.  She has returned home, is no longer short of breath and has regained her appetite.  She is walking with a walker.  She denies angina, pleuritic chest pain, orthopnea and PND, but does have some leg swelling.  He has not had syncope, dizziness, palpitations, falls or bleeding problems.  The patient does not have symptoms concerning for COVID-19 infection (fever, chills, cough, or new shortness of breath).    Past Medical History:   Diagnosis Date  . Allergic rhinitis, seasonal   . Breast cancer of upper-outer quadrant of right female breast ([place]) 09/13/2014  . Chronic venous insufficiency   . Dermatophytosis of nail   . Dysrhythmia    H/o Atrial fibrillation  . Edema leg   . Family history of malignant neoplasm of breast   . Family history of malignant neoplasm of ovary   . Goiter    right thyroid nodule   . HTN (hypertension) 08/13/2017  . HX: breast cancer    bilateral  . Menopausal syndrome   . Osteoarthritis   . Osteoporosis   . Squamous cell skin cancer    right lower leg  . Wears glasses   . Wears hearing aid    both ears   Past Surgical History:  Procedure Laterality Date  . bilateral lumpectomies for breast cancer  Aug. 2007  . BREAST LUMPECTOMY WITH RADIOACTIVE SEED LOCALIZATION Right 10/01/2014   Procedure: BREAST LUMPECTOMY WITH RADIOACTIVE SEED LOCALIZATION;  Surgeon: Erroll Luna, MD;  Location: [place];  Service: General;  Laterality: Right;  . BREAST LUMPECTOMY WITH RADIOACTIVE SEED LOCALIZATION Left 03/22/2019   Procedure: LEFT BREAST LUMPECTOMY WITH RADIOACTIVE SEED LOCALIZATION;  Surgeon: Erroll Luna, MD;  Location: [place];  Service: General;  Laterality: Left;  . CATARACT EXTRACTION    . CHOLECYSTECTOMY N/A 08/13/2017   Procedure: LAPAROSCOPIC CHOLECYSTECTOMY WITH INTRAOPERATIVE CHOLANGIOGRAM;  Surgeon: Excell Seltzer, MD;  Location: [facility];  Service: General;  Laterality: N/A;  . DILATION AND CURETTAGE OF UTERUS    . ESOPHAGOGASTRODUODENOSCOPY  07/26/2011   Procedure: ESOPHAGOGASTRODUODENOSCOPY (EGD);  Surgeon: Jeryl Columbia, MD;  Location: [facility];  Service: Endoscopy;  Laterality: N/A;  . history of lumbar compression fracture    . left eardum sx  1983  . left knee arthroscopic surgery    . no screening colonoscopy    . s/p bilateral lumpectomies    . SAVORY DILATION  07/26/2011   Procedure: SAVORY DILATION;  Surgeon: Jeryl Columbia, MD;  Location: [facility];  Service: Endoscopy;  Laterality: N/A;  . status post resection squamous cell cancer right lower leg    . TONSILLECTOMY    . UMBILICAL HERNIA REPAIR       Current Meds  Medication Sig  . acetaminophen (TYLENOL) 325 MG tablet Take 2 tablets (650 mg total) by mouth every 6 (six) hours as needed for mild pain (or Fever >/= 101).  Marland Kitchen atorvastatin (LIPITOR) 40 MG tablet Take 1 tablet (40 mg total) by mouth daily.  Marland Kitchen ELIQUIS 5 MG TABS tablet TAKE 1 TABLET BY MOUTH TWICE A DAY  . ferrous sulfate 325 (65 FE) MG tablet Take 325 mg by mouth. once day with meals 3x /week.  . furosemide (LASIX) 20 MG tablet Take 40 mg by mouth.  Marland Kitchen ipratropium (ATROVENT) 0.06 % nasal spray PLACE 2 SPRAYS INTO BOTH NOSTRILS 4 (FOUR) TIMES DAILY.  . metoprolol succinate (TOPROL-XL) 25 MG 24 hr tablet Take 25 mg by mouth at bedtime.  . montelukast (SINGULAIR) 10 MG tablet TAKE 1 TABLET BY MOUTH EVERYDAY AT BEDTIME (Patient taking differently: Take 10 mg by mouth at bedtime. )  . Multiple Vitamins-Minerals (PRESERVISION AREDS 2) CAPS Take 1 capsule by mouth 2 (two) times daily.   . pantoprazole (PROTONIX) 40 MG tablet Take 1 tablet (40 mg total) by mouth daily.  Vladimir Faster Glycol-Propyl Glycol (SYSTANE OP) Place 1 drop into both eyes 2 (two) times daily as needed (dry eyes).  . [DISCONTINUED] metoprolol succinate (TOPROL-XL) 50 MG 24 hr tablet TAKE 1 TABLET BY MOUTH AT BEDTIME WITH OR IMMEDIATELY FOLLOWING A MEAL *CALL FOR VIRTUAL VISIT*  . [DISCONTINUED] potassium chloride SA (KLOR-CON) 20 MEQ tablet Take 1 tablet (20 mEq total) by mouth daily.     Allergies:   Celecoxib   Social History   Tobacco Use  . Smoking status: Never Smoker  . Smokeless tobacco: Never Used  Substance Use Topics  . Alcohol use: No  . Drug use: No     Family Hx: The patient's family history includes Cancer in an other family member; Cancer (age of onset: 9) in her maternal aunt; Cancer (age of onset: 62) in her maternal aunt;  Cancer (age of onset: 59) in her mother; Coronary artery disease in an other family member; Heart attack in her brother, brother, and father; Heart disease in her brother.  ROS:   Please see the history of present illness.     All other systems reviewed and are negative.   Prior CV studies:   The following studies were reviewed today:  Echo from 08/26/2017 shows LVEF 50-55%, pseudonormal mitral inflow, moderate mitral insufficiency and moderate tricuspid insufficiency, mild to moderately dilated left atrium  Duplex venous ultrasound 05/11/2019 was negative for thrombus  Labs/Other Tests and Data Reviewed:    EKG:  An ECG dated 06/19/2019 was personally reviewed today and demonstrated:  Sinus rhythm and LVH with broadening, incomplete left bundle branch block  Recent Labs: 10/06/2018: TSH 4.04 06/29/2019: Magnesium 1.9 07/30/2019: ALT 17; Hemoglobin 10.7; Platelets 195 08/16/2019: BUN 33; Creat 1.12; Potassium 4.0; Sodium  143   Recent Lipid Panel Lab Results  Component Value Date/Time   CHOL 146 07/30/2019 08:53 AM   CHOL 109 09/19/2017 12:00 AM   TRIG 110 07/30/2019 08:53 AM   TRIG 107 09/19/2017 12:00 AM   HDL 49 (L) 07/30/2019 08:53 AM   CHOLHDL 3.0 07/30/2019 08:53 AM   LDLCALC 77 07/30/2019 08:53 AM   LDLCALC 59 09/19/2017 12:00 AM   LDLDIRECT 173.9 06/25/2013 09:48 AM    Wt Readings from Last 3 Encounters:  09/04/19 155 lb (70.3 kg)  08/22/19 164 lb (74.4 kg)  08/02/19 165 lb 12.8 oz (75.2 kg)     Objective:    Vital Signs:  BP (!) 110/50   Pulse 80   Ht 5\' 3"  (1.6 m)   Wt 155 lb (70.3 kg)   SpO2 99%   BMI 27.46 kg/m    VITAL SIGNS:  reviewed Unable to examine  ASSESSMENT & PLAN:    1. AFib: No recent episodes, even when she was hypoxic.  CHA2DS2-VASc score 4-6 (age, gender, hx of CHF, suspected CAD, hypertension). 2. Anticoagulation: No bleeding problems on anticoagulation. 3. COVID-19: In spite of her advanced age she has made an excellent recovery.   She had transient lung infiltrates and hypoxia did receive treatment with remdesivir and dexamethasone. 4. HLP: On statin.  Recent lipid profile showed an LDL cholesterol 77 which I believe is acceptable.  COVID-19 Education: The signs and symptoms of COVID-19 were discussed with the patient and how to seek care for testing (follow up with PCP or arrange E-visit).  The importance of social distancing was discussed today.  Time:   Today, I have spent 14 minutes with the patient with telehealth technology discussing the above problems.     Medication Adjustments/Labs and Tests Ordered: Current medicines are reviewed at length with the patient today.  Concerns regarding medicines are outlined above.   Tests Ordered: No orders of the defined types were placed in this encounter.   Medication Changes: No orders of the defined types were placed in this encounter.   Follow Up:  In Person 1 yr.  Signed, Sanda Klein, MD  09/04/2019 1:17 PM    [facility]

## 2019-09-04 NOTE — Patient Instructions (Signed)
Medication Instructions:  No changes *If you need a refill on your cardiac medications before your next appointment, please call your pharmacy*  Lab Work: None ordered If you have labs (blood work) drawn today and your tests are completely normal, you will receive your results only by: . MyChart Message (if you have MyChart) OR . A paper copy in the mail If you have any lab test that is abnormal or we need to change your treatment, we will call you to review the results.  Testing/Procedures: None ordered  Follow-Up: At [facility], you and your health needs are our priority.  As part of our continuing mission to provide you with exceptional heart care, we have created designated Provider Care Teams.  These Care Teams include your primary Cardiologist (physician) and Advanced Practice Providers (APPs -  Physician Assistants and Nurse Practitioners) who all work together to provide you with the care you need, when you need it.  Your next appointment:   12 month(s)  The format for your next appointment:   In Person  Provider:   You may see Mihai Croitoru, MD or one of the following Advanced Practice Providers on your designated Care Team:    Hao Meng, PA-C  Angela Duke, PA-C or   Krista Kroeger, PA-C  

## 2019-09-06 DIAGNOSIS — M6281 Muscle weakness (generalized): Secondary | ICD-10-CM | POA: Diagnosis not present

## 2019-09-06 DIAGNOSIS — U071 COVID-19: Secondary | ICD-10-CM | POA: Diagnosis not present

## 2019-09-06 DIAGNOSIS — R2681 Unsteadiness on feet: Secondary | ICD-10-CM | POA: Diagnosis not present

## 2019-09-06 DIAGNOSIS — R2689 Other abnormalities of gait and mobility: Secondary | ICD-10-CM | POA: Diagnosis not present

## 2019-09-06 DIAGNOSIS — J129 Viral pneumonia, unspecified: Secondary | ICD-10-CM | POA: Diagnosis not present

## 2019-09-06 DIAGNOSIS — I4891 Unspecified atrial fibrillation: Secondary | ICD-10-CM | POA: Diagnosis not present

## 2019-09-07 DIAGNOSIS — J129 Viral pneumonia, unspecified: Secondary | ICD-10-CM | POA: Diagnosis not present

## 2019-09-07 DIAGNOSIS — I4891 Unspecified atrial fibrillation: Secondary | ICD-10-CM | POA: Diagnosis not present

## 2019-09-07 DIAGNOSIS — M6281 Muscle weakness (generalized): Secondary | ICD-10-CM | POA: Diagnosis not present

## 2019-09-07 DIAGNOSIS — R2689 Other abnormalities of gait and mobility: Secondary | ICD-10-CM | POA: Diagnosis not present

## 2019-09-07 DIAGNOSIS — R2681 Unsteadiness on feet: Secondary | ICD-10-CM | POA: Diagnosis not present

## 2019-09-07 DIAGNOSIS — U071 COVID-19: Secondary | ICD-10-CM | POA: Diagnosis not present

## 2019-09-10 ENCOUNTER — Other Ambulatory Visit: Payer: Self-pay | Admitting: Allergy and Immunology

## 2019-09-10 DIAGNOSIS — M6281 Muscle weakness (generalized): Secondary | ICD-10-CM | POA: Diagnosis not present

## 2019-09-10 DIAGNOSIS — I4891 Unspecified atrial fibrillation: Secondary | ICD-10-CM | POA: Diagnosis not present

## 2019-09-10 DIAGNOSIS — R2681 Unsteadiness on feet: Secondary | ICD-10-CM | POA: Diagnosis not present

## 2019-09-10 DIAGNOSIS — R2689 Other abnormalities of gait and mobility: Secondary | ICD-10-CM | POA: Diagnosis not present

## 2019-09-10 DIAGNOSIS — J129 Viral pneumonia, unspecified: Secondary | ICD-10-CM | POA: Diagnosis not present

## 2019-09-10 DIAGNOSIS — U071 COVID-19: Secondary | ICD-10-CM | POA: Diagnosis not present

## 2019-09-11 ENCOUNTER — Other Ambulatory Visit: Payer: Self-pay | Admitting: Allergy and Immunology

## 2019-09-13 DIAGNOSIS — U071 COVID-19: Secondary | ICD-10-CM | POA: Diagnosis not present

## 2019-09-13 DIAGNOSIS — I4891 Unspecified atrial fibrillation: Secondary | ICD-10-CM | POA: Diagnosis not present

## 2019-09-13 DIAGNOSIS — R2681 Unsteadiness on feet: Secondary | ICD-10-CM | POA: Diagnosis not present

## 2019-09-13 DIAGNOSIS — J129 Viral pneumonia, unspecified: Secondary | ICD-10-CM | POA: Diagnosis not present

## 2019-09-13 DIAGNOSIS — M6281 Muscle weakness (generalized): Secondary | ICD-10-CM | POA: Diagnosis not present

## 2019-09-13 DIAGNOSIS — R2689 Other abnormalities of gait and mobility: Secondary | ICD-10-CM | POA: Diagnosis not present

## 2019-09-14 ENCOUNTER — Other Ambulatory Visit: Payer: Self-pay | Admitting: Allergy and Immunology

## 2019-09-17 ENCOUNTER — Other Ambulatory Visit: Payer: Self-pay | Admitting: Allergy and Immunology

## 2019-09-17 ENCOUNTER — Other Ambulatory Visit: Payer: Self-pay | Admitting: *Deleted

## 2019-09-17 DIAGNOSIS — Z23 Encounter for immunization: Secondary | ICD-10-CM | POA: Diagnosis not present

## 2019-09-18 ENCOUNTER — Other Ambulatory Visit: Payer: Self-pay

## 2019-09-18 ENCOUNTER — Encounter: Payer: Self-pay | Admitting: Family Medicine

## 2019-09-18 ENCOUNTER — Ambulatory Visit (INDEPENDENT_AMBULATORY_CARE_PROVIDER_SITE_OTHER): Payer: MEDICARE | Admitting: Family Medicine

## 2019-09-18 DIAGNOSIS — J309 Allergic rhinitis, unspecified: Secondary | ICD-10-CM

## 2019-09-18 MED ORDER — MONTELUKAST SODIUM 10 MG PO TABS: ORAL_TABLET | ORAL | 5 refills | Status: DC

## 2019-09-18 MED ORDER — IPRATROPIUM BROMIDE 0.06 % NA SOLN: 2.0000 | Freq: Four times a day (QID) | NASAL | 5 refills | Status: DC

## 2019-09-18 NOTE — Progress Notes (Signed)
RE: [name] MRN: [id number] DOB: 11/12/1921 Date of Telemedicine Visit: 09/18/2019  Referring provider: Virgie Dad, MD Primary care provider: Virgie Dad, MD  Chief Complaint: Allergic Rhinitis  and Asthma   Telemedicine Follow Up Visit via Telephone: I connected with [name] for a follow up on 09/18/19 by telephone and verified that I am speaking with the correct person using two identifiers.   I discussed the limitations, risks, security and privacy concerns of performing an evaluation and management service by telephone and the availability of in person appointments. I also discussed with the patient that there may be a patient responsible charge related to this service. The patient expressed understanding and agreed to proceed.  Patient is at home Provider is at the office.  Visit start time: 8:28 Visit end time: 9:03 Insurance consent/check in by: [place] consent and medical assistant/nurse: Gerre Pebbles  History of Present Illness: She is a 86 y.o. female, who is being followed for allergic rhinitis. Her previous allergy office visit was on 06/06/2018 with Dr. Neldon Mc. At today's visit, she reports that her allergic rhinitis has been moderately well controlled with occasional clear rhinorrhea. She is currently taking montelukast 10 mg once a day and using ipratropium 0.6% nasal spray 2 sprays in each nostril twice a day. She occasionally takes Claritin 10 mg once a day as needed with relief of symptoms. Her current medications are listed in the chart.     Assessment and Plan: Allergic rhinitis Continue montelukast 10 mg once a day for control of nasal symptoms Continue ipratropium 0.06 % 2 sprays in each nostril every 6 hours as needed for a runny nose Continue an over the counter antihistamine as needed for a runny nose.  Some examples of over the counter antihistamines include Zyrtec (cetirizine), Xyzal (levocetirizine), Allegra (fexofenadine), and  Claritin (loratidine).   Call the clinic if this treatment plan is not working well for you  Follow up in 1 year or sooner if needed. Return in about 1 year (around 09/17/2020), or if symptoms worsen or fail to improve.  Meds ordered this encounter  Medications  . montelukast (SINGULAIR) 10 MG tablet    Sig: Take one tablet once daily at night for coughing or wheezing.    Dispense:  30 tablet    Refill:  5  . ipratropium (ATROVENT) 0.06 % nasal spray    Sig: Place 2 sprays into both nostrils 4 (four) times daily.    Dispense:  15 mL    Refill:  5   Lab Orders  No laboratory test(s) ordered today     Medication List:  Current Outpatient Medications  Medication Sig Dispense Refill  . acetaminophen (TYLENOL) 325 MG tablet Take 2 tablets (650 mg total) by mouth every 6 (six) hours as needed for mild pain (or Fever >/= 101). 30 tablet 0  . atorvastatin (LIPITOR) 40 MG tablet Take 1 tablet (40 mg total) by mouth daily. 90 tablet 0  . ELIQUIS 5 MG TABS tablet TAKE 1 TABLET BY MOUTH TWICE A DAY 180 tablet 1  . ferrous sulfate 325 (65 FE) MG tablet Take 325 mg by mouth. once day with meals 3x /week.    . furosemide (LASIX) 20 MG tablet Take 40 mg by mouth.    Marland Kitchen ipratropium (ATROVENT) 0.06 % nasal spray Place 2 sprays into both nostrils 4 (four) times daily. 15 mL 5  . metoprolol succinate (TOPROL-XL) 25 MG 24 hr tablet Take 25 mg by  mouth at bedtime.    . montelukast (SINGULAIR) 10 MG tablet Take one tablet once daily at night for coughing or wheezing. 30 tablet 5  . Multiple Vitamins-Minerals (PRESERVISION AREDS 2) CAPS Take 1 capsule by mouth 2 (two) times daily.     . pantoprazole (PROTONIX) 40 MG tablet Take 1 tablet (40 mg total) by mouth daily. 30 tablet 3  . Polyethyl Glycol-Propyl Glycol (SYSTANE OP) Place 1 drop into both eyes 2 (two) times daily as needed (dry eyes).     No current facility-administered medications for this visit.   Allergies: Allergies  Allergen Reactions    . Celecoxib Swelling    Swelling-leg   I reviewed her past medical history, social history, family history, and environmental history and no significant changes have been reported from previous visit on 06/06/2018.  Objective: Physical Exam Not obtained as encounter was done via telephone.   Previous notes and tests were reviewed.  I discussed the assessment and treatment plan with the patient. The patient was provided an opportunity to ask questions and all were answered. The patient agreed with the plan and demonstrated an understanding of the instructions.   The patient was advised to call back or seek an in-person evaluation if the symptoms worsen or if the condition fails to improve as anticipated.  I provided 35 minutes of non-face-to-face time during this encounter.  It was my pleasure to participate in [name] care today. Please feel free to contact me with any questions or concerns.   Sincerely,  Carlena Bjornstad, MD   I have provided oversight concerning [name]' evaluation and treatment of this patient's health issues addressed during today's encounter. I agree with the assessment and therapeutic plan as outlined in the note.   Thank you for the opportunity to care for this patient.  Please do not hesitate to contact me with questions.  Penne Lash, M.D.  [facility] [address] [place], [place] [address] [phone]

## 2019-09-18 NOTE — Patient Instructions (Addendum)
Allergic rhinitis Continue montelukast 10 mg once a day for control of nasal symptoms Continue ipratropium 2 sprays in each nostril every 6 hours as needed for a runny nose Continue an over the counter antihistamine as needed for a runny nose.  Some examples of over the counter antihistamines include Zyrtec (cetirizine), Xyzal (levocetirizine), Allegra (fexofenadine), and Claritin (loratidine).   Call the clinic if this treatment plan is not working well for you  Follow up in 1 year or sooner if needed.

## 2019-09-19 DIAGNOSIS — M6281 Muscle weakness (generalized): Secondary | ICD-10-CM | POA: Diagnosis not present

## 2019-09-19 DIAGNOSIS — U071 COVID-19: Secondary | ICD-10-CM | POA: Diagnosis not present

## 2019-09-19 DIAGNOSIS — J1281 Pneumonia due to SARS-associated coronavirus: Secondary | ICD-10-CM | POA: Diagnosis not present

## 2019-09-20 DIAGNOSIS — U071 COVID-19: Secondary | ICD-10-CM | POA: Diagnosis not present

## 2019-09-20 DIAGNOSIS — J1281 Pneumonia due to SARS-associated coronavirus: Secondary | ICD-10-CM | POA: Diagnosis not present

## 2019-09-20 DIAGNOSIS — M6281 Muscle weakness (generalized): Secondary | ICD-10-CM | POA: Diagnosis not present

## 2019-09-24 DIAGNOSIS — C50912 Malignant neoplasm of unspecified site of left female breast: Secondary | ICD-10-CM | POA: Diagnosis not present

## 2019-09-25 DIAGNOSIS — M6281 Muscle weakness (generalized): Secondary | ICD-10-CM | POA: Diagnosis not present

## 2019-09-25 DIAGNOSIS — U071 COVID-19: Secondary | ICD-10-CM | POA: Diagnosis not present

## 2019-09-25 DIAGNOSIS — J1281 Pneumonia due to SARS-associated coronavirus: Secondary | ICD-10-CM | POA: Diagnosis not present

## 2019-09-27 DIAGNOSIS — M6281 Muscle weakness (generalized): Secondary | ICD-10-CM | POA: Diagnosis not present

## 2019-09-27 DIAGNOSIS — U071 COVID-19: Secondary | ICD-10-CM | POA: Diagnosis not present

## 2019-09-27 DIAGNOSIS — J1281 Pneumonia due to SARS-associated coronavirus: Secondary | ICD-10-CM | POA: Diagnosis not present

## 2019-10-01 DIAGNOSIS — J1281 Pneumonia due to SARS-associated coronavirus: Secondary | ICD-10-CM | POA: Diagnosis not present

## 2019-10-01 DIAGNOSIS — U071 COVID-19: Secondary | ICD-10-CM | POA: Diagnosis not present

## 2019-10-01 DIAGNOSIS — M6281 Muscle weakness (generalized): Secondary | ICD-10-CM | POA: Diagnosis not present

## 2019-10-21 NOTE — Progress Notes (Signed)
[name]  [phone] Fax:([phone]) [phone]     ID: [name] DOB: April 01, 1922  MR#: [id number]  RCV#:[id number]  Patient Care Team: Virgie Dad, MD as PCP - General (Internal Medicine) Sanda Klein, MD as PCP - Cardiology (Cardiology) Erroll Luna, MD as Consulting Physician (General Surgery) Magrinat, Virgie Dad, MD as Consulting Physician (Oncology) Arloa Koh, MD (Inactive) as Consulting Physician (Radiation Oncology) [name], RN as Registered Nurse [name], RN as Registered Nurse [name], NP (Inactive) as Nurse Practitioner (Nurse Practitioner) [name], MD as Consulting Physician (Gastroenterology) OTHER MD:  CHIEF COMPLAINT: Estrogen receptor positive breast cancer  CURRENT TREATMENT: observation   INTERVAL HISTORY: [name] returns today for for follow-up of her more recent left breast cancer.  She had annual diagnostic mammography at [facility] on 01/22/2019 showing a 1.1 cm left breast mass. She proceeded to left breast ultrasonography that day showing: 1.3 cm lobulated mass in the left breast at 2:30.  Accordingly on 02/19/2019 she proceeded to biopsy of the left breast area in question. The pathology from this procedure ([id number]) showed: invasive ductal carcinoma, grade 1. Prognostic indicators significant for: estrogen receptor, 100% positive and progesterone receptor, 100% positive, both with strong staining intensity. Proliferation marker Ki67 at 15%. HER2 negative by immunohistochemistry (1+).  She opted to proceed with left lumpectomy on 03/22/2019 under Dr. Brantley Stage. Pathology from the procedure ([id number]) showed: invasive ductal carcinoma, grade 1 1.5 cm; margins negative; tumor involves dermis of skin and adjacent breast tissue.  She also underwent lower extremity doppler on 05/11/2019, which was negative.  She underwent bone density screening on 01/22/2019 showing a T-score of -1.9, which is  considered osteopenic.  She tested positive for Covid-19 on 06/16/2019. She was subsequently admitted to the hospital on 06/19/2019 for pneumonia caused by Covid.  She is able to give me a good account of this experience.  Her husband's disease what was milder than hers.   REVIEW OF SYSTEMS: [name] continues to enjoy friends homes.  She and her husband participate in as many activities as they are able to.  She tells me she did well with her surgery which she does not recall well.  She is coming up on 86 years old next month and is looking forward to the birthday.   BREAST CANCER HISTORY: From the earlier summary note:  I saw [name] previously for a history of bilateral breast cancers, treated with bilateral lumpectomies and anti-estrogens for 5 years. She did not receive radiation treatments. She was last seen here in 2011.  She had her annual screening mammography at [facility] 08/27/2013, and this showed the breast density to be category B. There was an area of new grouped heterogeneous calcifications in the right breast at the 11:00 middle depth. A focal asymmetric density anterior to that area was seen in one view only. Additional views were recommended, but if they were performed I do not have those records. There appears to have been mammography on 03/05/2014, but again I do not have those records.  On 08/30/2014, bilateral diagnostic mammography with tomosynthesis this was obtained. The calcifications at the 11:00 position in the right breast were increased in number. There were no other significant findings. Biopsy of the area in question to 20 11/06/2014 showed ([id number]) high-grade ductal carcinoma in situ, with mucin extravasation.no definitive invasive component was seen however. This high-grade noninvasive tumor was estrogen receptor positive at 100%, progesterone receptor positive at 44%, both with strong staining intensity.  Her subsequent history is as detailed below   PAST MEDICAL  HISTORY: Past Medical History:  Diagnosis Date  . Allergic rhinitis, seasonal   . Breast cancer of upper-outer quadrant of right female breast ([facility]) 09/13/2014  . Chronic venous insufficiency   . Dermatophytosis of nail   . Dysrhythmia    H/o Atrial fibrillation  . Edema leg   . Family history of malignant neoplasm of breast   . Family history of malignant neoplasm of ovary   . Goiter    right thyroid nodule   . HTN (hypertension) 08/13/2017  . HX: breast cancer    bilateral  . Menopausal syndrome   . Osteoarthritis   . Osteoporosis   . Squamous cell skin cancer    right lower leg  . Wears glasses   . Wears hearing aid    both ears    PAST SURGICAL HISTORY: Past Surgical History:  Procedure Laterality Date  . bilateral lumpectomies for breast cancer  Aug. 2007  . BREAST LUMPECTOMY WITH RADIOACTIVE SEED LOCALIZATION Right 10/01/2014   Procedure: BREAST LUMPECTOMY WITH RADIOACTIVE SEED LOCALIZATION;  Surgeon: Erroll Luna, MD;  Location: [facility];  Service: General;  Laterality: Right;  . BREAST LUMPECTOMY WITH RADIOACTIVE SEED LOCALIZATION Left 03/22/2019   Procedure: LEFT BREAST LUMPECTOMY WITH RADIOACTIVE SEED LOCALIZATION;  Surgeon: Erroll Luna, MD;  Location: [facility];  Service: General;  Laterality: Left;  . CATARACT EXTRACTION    . CHOLECYSTECTOMY N/A 08/13/2017   Procedure: LAPAROSCOPIC CHOLECYSTECTOMY WITH INTRAOPERATIVE CHOLANGIOGRAM;  Surgeon: Excell Seltzer, MD;  Location: [facility];  Service: General;  Laterality: N/A;  . DILATION AND CURETTAGE OF UTERUS    . ESOPHAGOGASTRODUODENOSCOPY  07/26/2011   Procedure: ESOPHAGOGASTRODUODENOSCOPY (EGD);  Surgeon: Jeryl Columbia, MD;  Location: [facility];  Service: Endoscopy;  Laterality: N/A;  . history of lumbar compression fracture    . left eardum sx  1983  . left knee arthroscopic surgery    . no screening colonoscopy    . s/p bilateral lumpectomies    . SAVORY DILATION  07/26/2011   Procedure: SAVORY  DILATION;  Surgeon: Jeryl Columbia, MD;  Location: [facility];  Service: Endoscopy;  Laterality: N/A;  . status post resection squamous cell cancer right lower leg    . TONSILLECTOMY    . UMBILICAL HERNIA REPAIR      FAMILY HISTORY Family History  Problem Relation Age of Onset  . Cancer Mother 28       breast  . Heart attack Father   . Heart attack Brother   . Heart attack Brother   . Cancer Maternal Aunt 55       breast  . Coronary artery disease Other   . Cancer Other        ovarian; daughter of mat aunt w/ BC in 40s  . Heart disease Brother   . Cancer Maternal Aunt 73       breast  the patient's father died at age 26 from a heart attack. The patient's mother died at age 4. The patient's mother was one of 41 sisters, 9 siblings. 2 of the sisters had breast cancer, postmenopausal. Che Below self had 2 half-brothers and 1 full brother as well as one half-sister.There is no history of ovarian cancer in the familyexcept as noted.   GYNECOLOGIC HISTORY:  No LMP recorded. Patient is postmenopausal. Menarche age 21, the patient is GX P0. She received antiestrogen therapy for a total of 5 yearscompleted 2011 as detailed below  SOCIAL HISTORY:  Sharnese herself worked in the [employer] division of [employer]. Her husband [name] is a former Programme researcher, broadcasting/film/video. They currently reside at friends home [place], with no pets.    ADVANCED DIRECTIVES: in place   HEALTH MAINTENANCE: Social History   Tobacco Use  . Smoking status: Never Smoker  . Smokeless tobacco: Never Used  Substance Use Topics  . Alcohol use: No  . Drug use: No     Colonoscopy:  PAP:  Bone density:  Lipid panel:  Allergies  Allergen Reactions  . Celecoxib Swelling    Swelling-leg    Current Outpatient Medications  Medication Sig Dispense Refill  . acetaminophen (TYLENOL) 325 MG tablet Take 2 tablets (650 mg total) by mouth every 6 (six) hours as needed for mild pain (or Fever >/= 101).  30 tablet 0  . atorvastatin (LIPITOR) 40 MG tablet Take 1 tablet (40 mg total) by mouth daily. 90 tablet 0  . ELIQUIS 5 MG TABS tablet TAKE 1 TABLET BY MOUTH TWICE A DAY 180 tablet 1  . ferrous sulfate 325 (65 FE) MG tablet Take 325 mg by mouth. once day with meals 3x /week.    . furosemide (LASIX) 20 MG tablet Take 40 mg by mouth.    Marland Kitchen ipratropium (ATROVENT) 0.06 % nasal spray Place 2 sprays into both nostrils 4 (four) times daily. 15 mL 5  . metoprolol succinate (TOPROL-XL) 25 MG 24 hr tablet Take 25 mg by mouth at bedtime.    . montelukast (SINGULAIR) 10 MG tablet Take one tablet once daily at night for coughing or wheezing. 30 tablet 5  . Multiple Vitamins-Minerals (PRESERVISION AREDS 2) CAPS Take 1 capsule by mouth 2 (two) times daily.     . pantoprazole (PROTONIX) 40 MG tablet Take 1 tablet (40 mg total) by mouth daily. 30 tablet 3  . Polyethyl Glycol-Propyl Glycol (SYSTANE OP) Place 1 drop into both eyes 2 (two) times daily as needed (dry eyes).     No current facility-administered medications for this visit.    OBJECTIVE: white woman using a walker  Vitals:   10/22/19 1508  BP: (!) 143/52  Pulse: 74  Resp: 17  Temp: 98.9 F (37.2 C)  SpO2: 100%     Body mass index is 28.96 kg/m.    ECOG FS:2 - Symptomatic, <50% confined to bed  Sclerae unicteric, EOMs intact Wearing a mask No cervical or supraclavicular adenopathy Lungs no rales or rhonchi Heart regular rate and rhythm Abd soft, nontender, positive bowel sounds MSK no focal spinal tenderness, no upper extremity lymphedema Neuro: nonfocal, well oriented, appropriate affect Breasts: Status post bilateral lumpectomies most recently on the left side.  This is healing nicely, with no dehiscence erythema or swelling.  There is no evidence of local recurrence bilaterally.  Both axillae are benign   LAB RESULTS:  CMP     Component Value Date/Time   NA 143 08/16/2019 0000   NA 141 07/09/2019 0000   NA 145 09/19/2017 0000    NA 145 09/18/2014 1245   K 4.0 08/16/2019 0000   K 3.7 09/19/2017 0000   K 3.9 09/18/2014 1245   CL 108 08/16/2019 0000   CO2 26 08/16/2019 0000   CO2 25 09/18/2014 1245   GLUCOSE 110 (H) 08/16/2019 0000   GLUCOSE 107 09/18/2014 1245   BUN 33 (H) 08/16/2019 0000   BUN 14 07/09/2019 0000   BUN 20.1 09/18/2014 1245   CREATININE 1.12 (H) 08/16/2019 0000  CREATININE 1.2 (H) 09/18/2014 1245   CALCIUM 8.1 (L) 08/16/2019 0000   CALCIUM 7.3 09/19/2017 0000   CALCIUM 9.1 09/18/2014 1245   PROT 5.8 (L) 07/30/2019 0853   PROT 5.1 09/19/2017 0000   PROT 6.8 09/18/2014 1245   ALBUMIN 2.4 (A) 07/09/2019 0000   ALBUMIN 2.8 09/19/2017 0000   ALBUMIN 3.8 09/18/2014 1245   AST 18 07/30/2019 0853   AST 20 10/18/2018 1351   AST 14 09/18/2014 1245   ALT 17 07/30/2019 0853   ALT 18 10/18/2018 1351   ALT 23 09/19/2017 0000   ALT 8 09/18/2014 1245   ALKPHOS 78 06/29/2019 0000   ALKPHOS 87 09/19/2017 0000   ALKPHOS 100 09/18/2014 1245   BILITOT 0.5 07/30/2019 0853   BILITOT 0.5 10/18/2018 1351   BILITOT 0.49 09/18/2014 1245   GFRNONAA 42 (L) 08/16/2019 0000   GFRAA 48 (L) 08/16/2019 0000    INo results found for: SPEP, UPEP  Lab Results  Component Value Date   WBC 4.4 07/30/2019   NEUTROABS 2,812 07/30/2019   HGB 10.7 (L) 07/30/2019   HCT 33.3 (L) 07/30/2019   MCV 94.3 07/30/2019   PLT 195 07/30/2019      Chemistry      Component Value Date/Time   NA 143 08/16/2019 0000   NA 141 07/09/2019 0000   NA 145 09/19/2017 0000   NA 145 09/18/2014 1245   K 4.0 08/16/2019 0000   K 3.7 09/19/2017 0000   K 3.9 09/18/2014 1245   CL 108 08/16/2019 0000   CO2 26 08/16/2019 0000   CO2 25 09/18/2014 1245   BUN 33 (H) 08/16/2019 0000   BUN 14 07/09/2019 0000   BUN 20.1 09/18/2014 1245   CREATININE 1.12 (H) 08/16/2019 0000   CREATININE 1.2 (H) 09/18/2014 1245   GLU 123 07/09/2019 0000      Component Value Date/Time   CALCIUM 8.1 (L) 08/16/2019 0000   CALCIUM 7.3 09/19/2017 0000    CALCIUM 9.1 09/18/2014 1245   ALKPHOS 78 06/29/2019 0000   ALKPHOS 87 09/19/2017 0000   ALKPHOS 100 09/18/2014 1245   AST 18 07/30/2019 0853   AST 20 10/18/2018 1351   AST 14 09/18/2014 1245   ALT 17 07/30/2019 0853   ALT 18 10/18/2018 1351   ALT 23 09/19/2017 0000   ALT 8 09/18/2014 1245   BILITOT 0.5 07/30/2019 0853   BILITOT 0.5 10/18/2018 1351   BILITOT 0.49 09/18/2014 1245       Lab Results  Component Value Date   LABCA2 34 05/20/2011    No components found for: [id number]  No results for input(s): INR in the last 168 hours.  Urinalysis    Component Value Date/Time   COLORURINE YELLOW 08/12/2017 0627   APPEARANCEUR CLEAR 08/12/2017 0627   LABSPEC 1.014 08/12/2017 0627   PHURINE 6.0 08/12/2017 0627   GLUCOSEU NEGATIVE 08/12/2017 0627   HGBUR SMALL (A) 08/12/2017 0627   HGBUR trace-intact 10/28/2009 0905   BILIRUBINUR NEGATIVE 08/12/2017 0627   KETONESUR NEGATIVE 08/12/2017 0627   PROTEINUR NEGATIVE 08/12/2017 0627   UROBILINOGEN 0.2 10/28/2009 0905   NITRITE NEGATIVE 08/12/2017 0627   LEUKOCYTESUR TRACE (A) 08/12/2017 0627    STUDIES: No results found.    ASSESSMENT: 86 y.o. BRCA negative [place] resident   (1) status post bilateral lumpectomies without sentinel lymph node biopsy in August 2007 for a right-sided 7 mm, grade 2, invasive ductal carcinoma, and a left-sided 8 mm, grade 1, invasive ductal carcinoma.  Both tumors were ER positive, HER-2/neu negative, both with a low proliferation fraction.   (a) On Arimidex from September 2007 until May 2009, at which time we switched to tamoxifen primarily secondary to concerns regarding osteoporosis, completing five years of antiestrogen September 2011.   (b)  Also received Zometa yearly, discontinued after 4 doses in 2011  (2)  Status post right breast upper outer quadrant biopsy 09/11/2014 4 a 4.2 cm area of ductal carcinoma in situ, grade 2 or 3,  with extravasated mucin, estrogen and progesterone  receptor positive   (3) status post right lumpectomy 10/01/2014 for a pT1a pNX, stage IA invasive ductal carcinoma, grade 2, estrogen  and progesterone receptor positive, HER-2 negative, with an MIB-1 of 38%. Margins were close but negative   (4) she did not need adjuvant radiation   (5) opted against anti-estrogens  (a)  DEXA scan 10/31/2014 shows a T score of -1.8.  (6)  the BreastNext gene panel performed at [facility] 09/19/2014 showed no deleterious mutations in ATM, BARD1, BRCA1, BRCA2, BRIP1, CDH1, CHEK2, MRE11A, MUTYH, NBN, NF1, PALB2, PTEN, RAD50, RAD51C, RAD51D, or TP53.  (7) status post left lumpectomy 03/28/2019 for a pT1c pNX, stage IA invasive ductal carcinoma, with negative margins; the tumor was strongly estrogen and progesterone receptor positive, HER-2 not amplified, with an MIB-1 of 15%.   PLAN: Zoa has had bilateral breast cancers x2, most recently on the left.  She did receive 5 years of antiestrogens with her original tumors, between September 2007 in May 2009.  The most recent cancer was invasive but small, low-grade, and strongly hormone receptor positive.  The possibility of local recurrence is low to moderate and the possibility of distant recurrence is very low.  I do not believe starting antiestrogens would prolong this patient's life and if she did have a recurrence very likely it could be controlled starting antiestrogens at that time.  On the other side if she did take tamoxifen as she would be at risk of clotting and if she took an aromatase inhibitor she could have worsening bone density issues either of which could be life-threatening at this point.  Accordingly I think the best strategy for Marnell is increased surveillance.  She will see me in 4 months and if everything is going well at that time likely we will go to every 6 months possibly alternating with her surgeon.  I wished her an early happy 71 birthday.  She knows to call for any other issue that  may develop before the next visit.  Total encounter time 20 minutes.*  Magrinat, Virgie Dad, MD  10/22/19 5:51 PM Medical Oncology and Hematology [facility] [place], [place] [address] Tel. [phone]    Fax. [phone]   I, Wilburn Mylar, am acting as scribe for Dr. Virgie Dad. Magrinat.  I, Lurline Del MD, have reviewed the above documentation for accuracy and completeness, and I agree with the above.   *Total Encounter Time as defined by the Centers for Medicare and Medicaid Services includes, in addition to the face-to-face time of a patient visit (documented in the note above) non-face-to-face time: obtaining and reviewing outside history, ordering and reviewing medications, tests or procedures, care coordination (communications with other health care professionals or caregivers) and documentation in the medical record.

## 2019-10-22 ENCOUNTER — Other Ambulatory Visit: Payer: Self-pay

## 2019-10-22 ENCOUNTER — Inpatient Hospital Stay: Payer: MEDICARE | Attending: Oncology | Admitting: Oncology

## 2019-10-22 VITALS — BP 143/52 | HR 74 | Temp 98.9°F | Resp 17 | Ht 63.0 in | Wt 163.5 lb

## 2019-10-22 DIAGNOSIS — Z79899 Other long term (current) drug therapy: Secondary | ICD-10-CM | POA: Insufficient documentation

## 2019-10-22 DIAGNOSIS — Z803 Family history of malignant neoplasm of breast: Secondary | ICD-10-CM | POA: Insufficient documentation

## 2019-10-22 DIAGNOSIS — I4891 Unspecified atrial fibrillation: Secondary | ICD-10-CM | POA: Diagnosis not present

## 2019-10-22 DIAGNOSIS — Z853 Personal history of malignant neoplasm of breast: Secondary | ICD-10-CM | POA: Diagnosis not present

## 2019-10-22 DIAGNOSIS — C50412 Malignant neoplasm of upper-outer quadrant of left female breast: Secondary | ICD-10-CM

## 2019-10-22 DIAGNOSIS — Z7901 Long term (current) use of anticoagulants: Secondary | ICD-10-CM | POA: Insufficient documentation

## 2019-10-22 DIAGNOSIS — M1990 Unspecified osteoarthritis, unspecified site: Secondary | ICD-10-CM | POA: Diagnosis not present

## 2019-10-22 DIAGNOSIS — I872 Venous insufficiency (chronic) (peripheral): Secondary | ICD-10-CM | POA: Insufficient documentation

## 2019-10-22 DIAGNOSIS — Z8616 Personal history of COVID-19: Secondary | ICD-10-CM | POA: Diagnosis not present

## 2019-10-22 DIAGNOSIS — Z170 Estrogen receptor positive status [ER+]: Secondary | ICD-10-CM | POA: Diagnosis not present

## 2019-10-22 DIAGNOSIS — M810 Age-related osteoporosis without current pathological fracture: Secondary | ICD-10-CM | POA: Diagnosis not present

## 2019-10-22 DIAGNOSIS — I10 Essential (primary) hypertension: Secondary | ICD-10-CM | POA: Insufficient documentation

## 2019-10-23 ENCOUNTER — Telehealth: Payer: Self-pay | Admitting: Oncology

## 2019-10-23 NOTE — Telephone Encounter (Signed)
Scheduled appt per 4/5 los. Pt confirmed new appt date and time.

## 2019-11-05 ENCOUNTER — Other Ambulatory Visit: Payer: Self-pay

## 2019-11-05 DIAGNOSIS — D649 Anemia, unspecified: Secondary | ICD-10-CM

## 2019-11-05 DIAGNOSIS — I4891 Unspecified atrial fibrillation: Secondary | ICD-10-CM

## 2019-11-05 DIAGNOSIS — R600 Localized edema: Secondary | ICD-10-CM | POA: Diagnosis not present

## 2019-11-05 LAB — CBC
HCT: 37 % (ref 35.0–45.0)
Hemoglobin: 12.1 g/dL (ref 11.7–15.5)
MCH: 30 pg (ref 27.0–33.0)
MCHC: 32.7 g/dL (ref 32.0–36.0)
MCV: 91.6 fL (ref 80.0–100.0)
MPV: 11.2 fL (ref 7.5–12.5)
Platelets: 177 10*3/uL (ref 140–400)
RBC: 4.04 10*6/uL (ref 3.80–5.10)
RDW: 13 % (ref 11.0–15.0)
WBC: 4.7 10*3/uL (ref 3.8–10.8)

## 2019-11-05 LAB — TSH: TSH: 5.39 mIU/L — ABNORMAL HIGH (ref 0.40–4.50)

## 2019-11-05 LAB — BASIC METABOLIC PANEL
BUN/Creatinine Ratio: 21 (calc) (ref 6–22)
BUN: 22 mg/dL (ref 7–25)
CO2: 28 mmol/L (ref 20–32)
Calcium: 9 mg/dL (ref 8.6–10.4)
Chloride: 107 mmol/L (ref 98–110)
Creat: 1.04 mg/dL — ABNORMAL HIGH (ref 0.60–0.88)
Glucose, Bld: 116 mg/dL — ABNORMAL HIGH (ref 65–99)
Potassium: 4.1 mmol/L (ref 3.5–5.3)
Sodium: 144 mmol/L (ref 135–146)

## 2019-11-07 ENCOUNTER — Non-Acute Institutional Stay: Payer: MEDICARE | Admitting: Internal Medicine

## 2019-11-07 ENCOUNTER — Other Ambulatory Visit: Payer: Self-pay

## 2019-11-07 ENCOUNTER — Encounter: Payer: Self-pay | Admitting: Internal Medicine

## 2019-11-07 VITALS — BP 124/56 | HR 62 | Temp 97.7°F | Ht 63.0 in | Wt 167.0 lb

## 2019-11-07 DIAGNOSIS — D649 Anemia, unspecified: Secondary | ICD-10-CM | POA: Diagnosis not present

## 2019-11-07 DIAGNOSIS — E782 Mixed hyperlipidemia: Secondary | ICD-10-CM

## 2019-11-07 DIAGNOSIS — R600 Localized edema: Secondary | ICD-10-CM | POA: Diagnosis not present

## 2019-11-07 DIAGNOSIS — I4891 Unspecified atrial fibrillation: Secondary | ICD-10-CM

## 2019-11-07 DIAGNOSIS — I5032 Chronic diastolic (congestive) heart failure: Secondary | ICD-10-CM

## 2019-11-07 DIAGNOSIS — R7303 Prediabetes: Secondary | ICD-10-CM | POA: Diagnosis not present

## 2019-11-07 NOTE — Progress Notes (Signed)
Location:  [place] of Service:  Clinic (12)  Provider:   Code Status:  Goals of Care:  Advanced Directives 06/20/2019  Does Patient Have a Medical Advance Directive? No  Type of Advance Directive -  Does patient want to make changes to medical advance directive? -  Copy of [place] in Chart? -  Would patient like information on creating a medical advance directive? No - Patient declined  Pre-existing out of facility DNR order (yellow form or pink MOST form) -     Chief Complaint  Patient presents with  . Medical Management of Chronic Issues    Patient returns for her 2 month follow up. She complains of being slower, memory issues and having a hard time hearing. She unable to confirm hr medications list but states there hasn't been any changes.     HPI: Patient is a 86 y.o. female seen today for medical management of chronic diseases.    Patient has a history of breast cancer s/p lumpectomy, A. fib on chronic Eliquis, coronary artery disease, hypertension, hyperlipidemia, LE edema Admitted inhospital from 12/1-12/9 forCovid Pneumoniatreated with 5 days of remdesivir and 10 days of Decadron  LE edema Taking lower dose of Lasix Doing well with her swelling Covid Positive Pneumonia Continue to have weakness But no SOB Appetite is good. Has Gained weight PAF On Eliquis  Walks with the walker. Lives with her husband   Past Medical History:  Diagnosis Date  . Allergic rhinitis, seasonal   . Breast cancer of upper-outer quadrant of right female breast ([place]) 09/13/2014  . Chronic venous insufficiency   . Dermatophytosis of nail   . Dysrhythmia    H/o Atrial fibrillation  . Edema leg   . Family history of malignant neoplasm of breast   . Family history of malignant neoplasm of ovary   . Goiter    right thyroid nodule   . HTN (hypertension) 08/13/2017  . HX: breast cancer    bilateral  . Menopausal syndrome   . Osteoarthritis     . Osteoporosis   . Squamous cell skin cancer    right lower leg  . Wears glasses   . Wears hearing aid    both ears    Past Surgical History:  Procedure Laterality Date  . bilateral lumpectomies for breast cancer  Aug. 2007  . BREAST LUMPECTOMY WITH RADIOACTIVE SEED LOCALIZATION Right 10/01/2014   Procedure: BREAST LUMPECTOMY WITH RADIOACTIVE SEED LOCALIZATION;  Surgeon: Erroll Luna, MD;  Location: [place] [facility];  Service: General;  Laterality: Right;  . BREAST LUMPECTOMY WITH RADIOACTIVE SEED LOCALIZATION Left 03/22/2019   Procedure: LEFT BREAST LUMPECTOMY WITH RADIOACTIVE SEED LOCALIZATION;  Surgeon: Erroll Luna, MD;  Location: [place] [facility];  Service: General;  Laterality: Left;  . CATARACT EXTRACTION    . CHOLECYSTECTOMY N/A 08/13/2017   Procedure: LAPAROSCOPIC CHOLECYSTECTOMY WITH INTRAOPERATIVE CHOLANGIOGRAM;  Surgeon: Excell Seltzer, MD;  Location: [facility];  Service: General;  Laterality: N/A;  . DILATION AND CURETTAGE OF UTERUS    . ESOPHAGOGASTRODUODENOSCOPY  07/26/2011   Procedure: ESOPHAGOGASTRODUODENOSCOPY (EGD);  Surgeon: Jeryl Columbia, MD;  Location: [facility];  Service: Endoscopy;  Laterality: N/A;  . history of lumbar compression fracture    . left eardum sx  1983  . left knee arthroscopic surgery    . no screening colonoscopy    . s/p bilateral lumpectomies    . SAVORY DILATION  07/26/2011   Procedure: SAVORY DILATION;  Surgeon: Altamese Dilling  Drema Balzarine, MD;  Location: [facility];  Service: Endoscopy;  Laterality: N/A;  . status post resection squamous cell cancer right lower leg    . TONSILLECTOMY    . UMBILICAL HERNIA REPAIR      Allergies  Allergen Reactions  . Celecoxib Swelling    Swelling-leg    Outpatient Encounter Medications as of 11/07/2019  Medication Sig  . acetaminophen (TYLENOL) 325 MG tablet Take 2 tablets (650 mg total) by mouth every 6 (six) hours as needed for mild pain (or Fever >/= 101).  Marland Kitchen atorvastatin (LIPITOR) 40 MG tablet Take 1 tablet  (40 mg total) by mouth daily.  Marland Kitchen ELIQUIS 5 MG TABS tablet TAKE 1 TABLET BY MOUTH TWICE A DAY  . ferrous sulfate 325 (65 FE) MG tablet Take 325 mg by mouth. once day with meals 3x /week.  . furosemide (LASIX) 20 MG tablet Take 20 mg by mouth.   Marland Kitchen ipratropium (ATROVENT) 0.06 % nasal spray Place 2 sprays into both nostrils 4 (four) times daily.  . metoprolol succinate (TOPROL-XL) 25 MG 24 hr tablet Take 25 mg by mouth at bedtime.  . montelukast (SINGULAIR) 10 MG tablet Take one tablet once daily at night for coughing or wheezing.  . Multiple Vitamins-Minerals (PRESERVISION AREDS 2) CAPS Take 1 capsule by mouth 2 (two) times daily.   Vladimir Faster Glycol-Propyl Glycol (SYSTANE OP) Place 1 drop into both eyes 2 (two) times daily as needed (dry eyes).  . [DISCONTINUED] pantoprazole (PROTONIX) 40 MG tablet Take 1 tablet (40 mg total) by mouth daily.   No facility-administered encounter medications on file as of 11/07/2019.    Review of Systems:  Review of Systems Review of Systems  Constitutional: Negative for activity change, appetite change, chills, diaphoresis, fatigue and fever.  HENT: Negative for mouth sores, postnasal drip, rhinorrhea, sinus pain and sore throat.   Respiratory: Negative for apnea, cough, chest tightness, shortness of breath and wheezing.   Cardiovascular: Negative for chest pain, palpitations .  Gastrointestinal: Negative for abdominal distention, abdominal pain, constipation, diarrhea, nausea and vomiting.  Genitourinary: Negative for dysuria and frequency.  Musculoskeletal: Negative for arthralgias, joint swelling and myalgias.  Skin: Negative for rash.  Neurological: Negative for dizziness, syncope, weakness, light-headedness and numbness.  Psychiatric/Behavioral: Negative for behavioral problems, confusion and sleep disturbance.    Health Maintenance  Topic Date Due  . COVID-19 Vaccine (1) Never done  . MAMMOGRAM  07/25/2019  . INFLUENZA VACCINE  02/17/2020  .  TETANUS/TDAP  05/04/2023  . DEXA SCAN  Completed  . PNA vac Low Risk Adult  Completed    Physical Exam: Vitals:   11/07/19 1456  BP: (!) 124/56  Pulse: 62  Temp: 97.7 F (36.5 C)  SpO2: 97%  Weight: 167 lb (75.8 kg)  Height: 5\' 3"  (1.6 m)   Body mass index is 29.58 kg/m. Physical Exam  Constitutional: Oriented to person, place, and time. Well-developed and well-nourished.  HENT:  Head: Normocephalic.  Mouth/Throat: Oropharynx is clear and moist.  Eyes: Pupils are equal, round, and reactive to light. Has Stye in Left eye Neck: Neck supple.  Cardiovascular: Normal rate and normal heart sounds.   Pulmonary/Chest: Effort normal and breath sounds normal. No respiratory distress. No wheezes. She has no rales.  Abdominal: Soft. Bowel sounds are normal. No distension. There is no tenderness. There is no rebound.  Musculoskeletal: Mod Edema Bilateral Lymphadenopathy: none Neurological: Alert and oriented to person, place, and time. No Focal Deficits Skin: Skin is warm and dry.  Psychiatric: Normal mood and affect. Behavior is normal. Thought content normal.    Labs reviewed: Basic Metabolic Panel: Recent Labs    06/22/19 0343 06/22/19 0343 06/23/19 0315 06/23/19 0315 06/24/19 0417 06/24/19 0417 06/26/19 0302 06/27/19 0346 06/29/19 0000 07/09/19 0000 07/30/19 0853 08/16/19 0000 11/05/19 0805  NA 140   < > 142   < > 142   < > 136   < > 140   < > 143 143 144  K 4.1   < > 4.5   < > 4.6   < > 5.2*   < > 4.5   < > 3.9 4.0 4.1  CL 108   < > 108   < > 106   < > 100   < > 103   < > 107 108 107  CO2 21*   < > 22   < > 25   < > 25   < > 29*   < > 28 26 28   GLUCOSE 189*   < > 206*   < > 240*   < > 192*   < >  --   --  109* 110* 116*  BUN 35*   < > 36*   < > 36*   < > 43*   < > 38*   < > 18 33* 22  CREATININE 0.90   < > 0.95   < > 0.97   < > 0.89   < > 1.0   < > 1.02* 1.12* 1.04*  CALCIUM 8.2*   < > 8.6*   < > 9.0   < > 8.9   < > 8.0*   < > 8.6 8.1* 9.0  MG 2.0   < > 2.2   < >  2.1  --  1.9  --  1.9  --   --   --   --   PHOS 2.8  --  2.9  --  3.4  --   --   --   --   --   --   --   --   TSH  --   --   --   --   --   --   --   --   --   --   --   --  5.39*   < > = values in this interval not displayed.   Liver Function Tests: Recent Labs    06/26/19 0302 06/26/19 0302 06/27/19 0346 06/29/19 0000 07/09/19 0000 07/30/19 0853  AST 21   < > 27 28  --  18  ALT 54*   < > 51* 56*  --  17  ALKPHOS 81  --  78 78  --   --   BILITOT 0.7  --  0.5  --   --  0.5  PROT 5.5*  --  5.3*  --   --  5.8*  ALBUMIN 2.4*   < > 2.3* 2.6* 2.4*  --    < > = values in this interval not displayed.   No results for input(s): LIPASE, AMYLASE in the last 8760 hours. No results for input(s): AMMONIA in the last 8760 hours. CBC: Recent Labs    06/24/19 0417 06/26/19 0302 06/27/19 0346 06/27/19 0346 06/29/19 0000 06/29/19 0000 07/09/19 0000 07/30/19 0853 11/05/19 0805  WBC 9.5   < > 9.0  --  7.9   < > 3.9 4.4 4.7  NEUTROABS 8.8*  --   --   --  [id number]  --   --  2,812  --   HGB 11.4*   < > 12.0   < > 11.8*   < > 8.7* 10.7* 12.1  HCT 36.7   < > 36.4   < > 36   < > 27* 33.3* 37.0  MCV 95.8   < > 92.2  --   --   --   --  94.3 91.6  PLT 256   < > 181   < > 252   < > 153 195 177   < > = values in this interval not displayed.   Lipid Panel: Recent Labs    06/19/19 2155 07/30/19 0853  CHOL  --  146  HDL  --  49*  LDLCALC  --  77  TRIG 59 110  CHOLHDL  --  3.0   Lab Results  Component Value Date   HGBA1C 6.5 (H) 06/27/2019    Procedures since last visit: No results found.  Assessment/Plan  Atrial fibrillation with RVR (HCC) Metoprolol and Eliquis Follows with [facility] Bilateral leg edema Taking 20 mg of lasix now Acute on chronic anemia Hgb normal now Can stop Iron Did not want GI work up Pneumonia due to COVID-19 virus Continues to be weak but doing well overall  Mixed hyperlipidemia On Lipitor LDL 77 HOH Left Eye Stye Warm  Compresses  GERD Stopped Protonix as was Diarrhea Allergic Rhinitis Singulair and  DEXA Scan  Tscore -1.9 Bilateral Mastectomy Follows with Dr Jana Hakim Per his notes no Adjuvant therapy Just [facility] Labs/tests ordered:  * No order type specified * Next appt:  Visit date not found  Total time spent in this patient care encounter was 45 _  minutes; greater than 50% of the visit spent counseling patient and staff, reviewing records , Labs and coordinating care for problems addressed at this encounter.

## 2019-11-14 ENCOUNTER — Other Ambulatory Visit: Payer: Self-pay | Admitting: *Deleted

## 2019-11-14 MED ORDER — PANTOPRAZOLE SODIUM 40 MG PO TBEC: 40.0000 mg | DELAYED_RELEASE_TABLET | Freq: Every day | ORAL | 1 refills | Status: DC

## 2019-11-14 MED ORDER — FUROSEMIDE 20 MG PO TABS: 20.0000 mg | ORAL_TABLET | Freq: Every day | ORAL | 1 refills | Status: DC

## 2019-11-14 NOTE — Addendum Note (Signed)
Addended by: Rafael Bihari A on: 11/14/2019 02:32 PM   Modules accepted: Orders

## 2019-11-14 NOTE — Telephone Encounter (Signed)
Virgie Dad, MD  Rafael Bihari A, CMA  Rodena Piety you can add Pantoprazole 40 mg QD in [employer].  That message disappeared    Added and sent to Pharmacy.

## 2019-11-14 NOTE — Telephone Encounter (Signed)
Patient husband, Herbie [place] called and stated that patient needs refills on Furosemide and Pantoprazole 40mg  once daily. Pended Rx for Furosemide due to [place].   Pantoprazole is NOT in patient's current medication list but husband is requesting.  Is it ok to add back Pantoprazole 40mg  once daily.   Please Advise.

## 2019-11-23 ENCOUNTER — Other Ambulatory Visit: Payer: Self-pay | Admitting: Cardiovascular Disease

## 2019-11-27 ENCOUNTER — Ambulatory Visit: Payer: MEDICARE | Admitting: Podiatry

## 2019-11-29 ENCOUNTER — Ambulatory Visit (INDEPENDENT_AMBULATORY_CARE_PROVIDER_SITE_OTHER): Payer: MEDICARE | Admitting: Podiatry

## 2019-11-29 ENCOUNTER — Encounter: Payer: Self-pay | Admitting: Podiatry

## 2019-11-29 ENCOUNTER — Other Ambulatory Visit: Payer: Self-pay

## 2019-11-29 DIAGNOSIS — B351 Tinea unguium: Secondary | ICD-10-CM | POA: Diagnosis not present

## 2019-11-29 DIAGNOSIS — D689 Coagulation defect, unspecified: Secondary | ICD-10-CM | POA: Diagnosis not present

## 2019-11-29 DIAGNOSIS — Q828 Other specified congenital malformations of skin: Secondary | ICD-10-CM

## 2019-11-29 DIAGNOSIS — M79676 Pain in unspecified toe(s): Secondary | ICD-10-CM

## 2019-11-29 NOTE — Progress Notes (Signed)
She presents today chief complaint of painfully elongated toenails and calluses.  Objective: Toenails are long thick yellow dystrophic-like mycotic no open lesions or wounds.  Porokeratotic lesions beneath the fourth metatarsal head of the left foot as well as the right foot.  Toenails are long thick yellow dystrophic with mycotic.  Assessment: Pain in limb secondary onychomycosis porokeratosis.  Plan: Debridement of all reactive hyperkeratotic tissue debridement of toenails 1 through 5 placed padding.

## 2019-12-10 ENCOUNTER — Telehealth: Payer: Self-pay | Admitting: Family Medicine

## 2019-12-10 MED ORDER — MONTELUKAST SODIUM 10 MG PO TABS: ORAL_TABLET | ORAL | 1 refills | Status: DC

## 2019-12-10 NOTE — Telephone Encounter (Signed)
Prescription has been sent in. Called patient and advised. Patient verbalized understanding.

## 2019-12-10 NOTE — Telephone Encounter (Signed)
Patient husband called and need to have singulair 10mg  called in and would like to have 63 day suppy called in. [facility] [address]. [phone].

## 2019-12-20 ENCOUNTER — Telehealth: Payer: Self-pay | Admitting: Oncology

## 2019-12-20 NOTE — Telephone Encounter (Signed)
Release I.d: [id number] Faxed medical records to [facility] @ [phone]

## 2020-02-01 ENCOUNTER — Other Ambulatory Visit: Payer: Self-pay | Admitting: *Deleted

## 2020-02-01 MED ORDER — IPRATROPIUM BROMIDE 0.06 % NA SOLN: 2.0000 | Freq: Four times a day (QID) | NASAL | 5 refills | Status: DC

## 2020-02-01 NOTE — Telephone Encounter (Signed)
Patient husband requested refill Pended Rx and sent to Dr. Lyndel Safe for approval.

## 2020-02-02 IMAGING — DX DG CHEST 1V PORT
1 series · 1 of 1 positions shown · non-contrast
Comparison: 08/29/2017

CLINICAL DATA: COVID, cough

EXAM:
PORTABLE CHEST 1 VIEW

[chest ap]
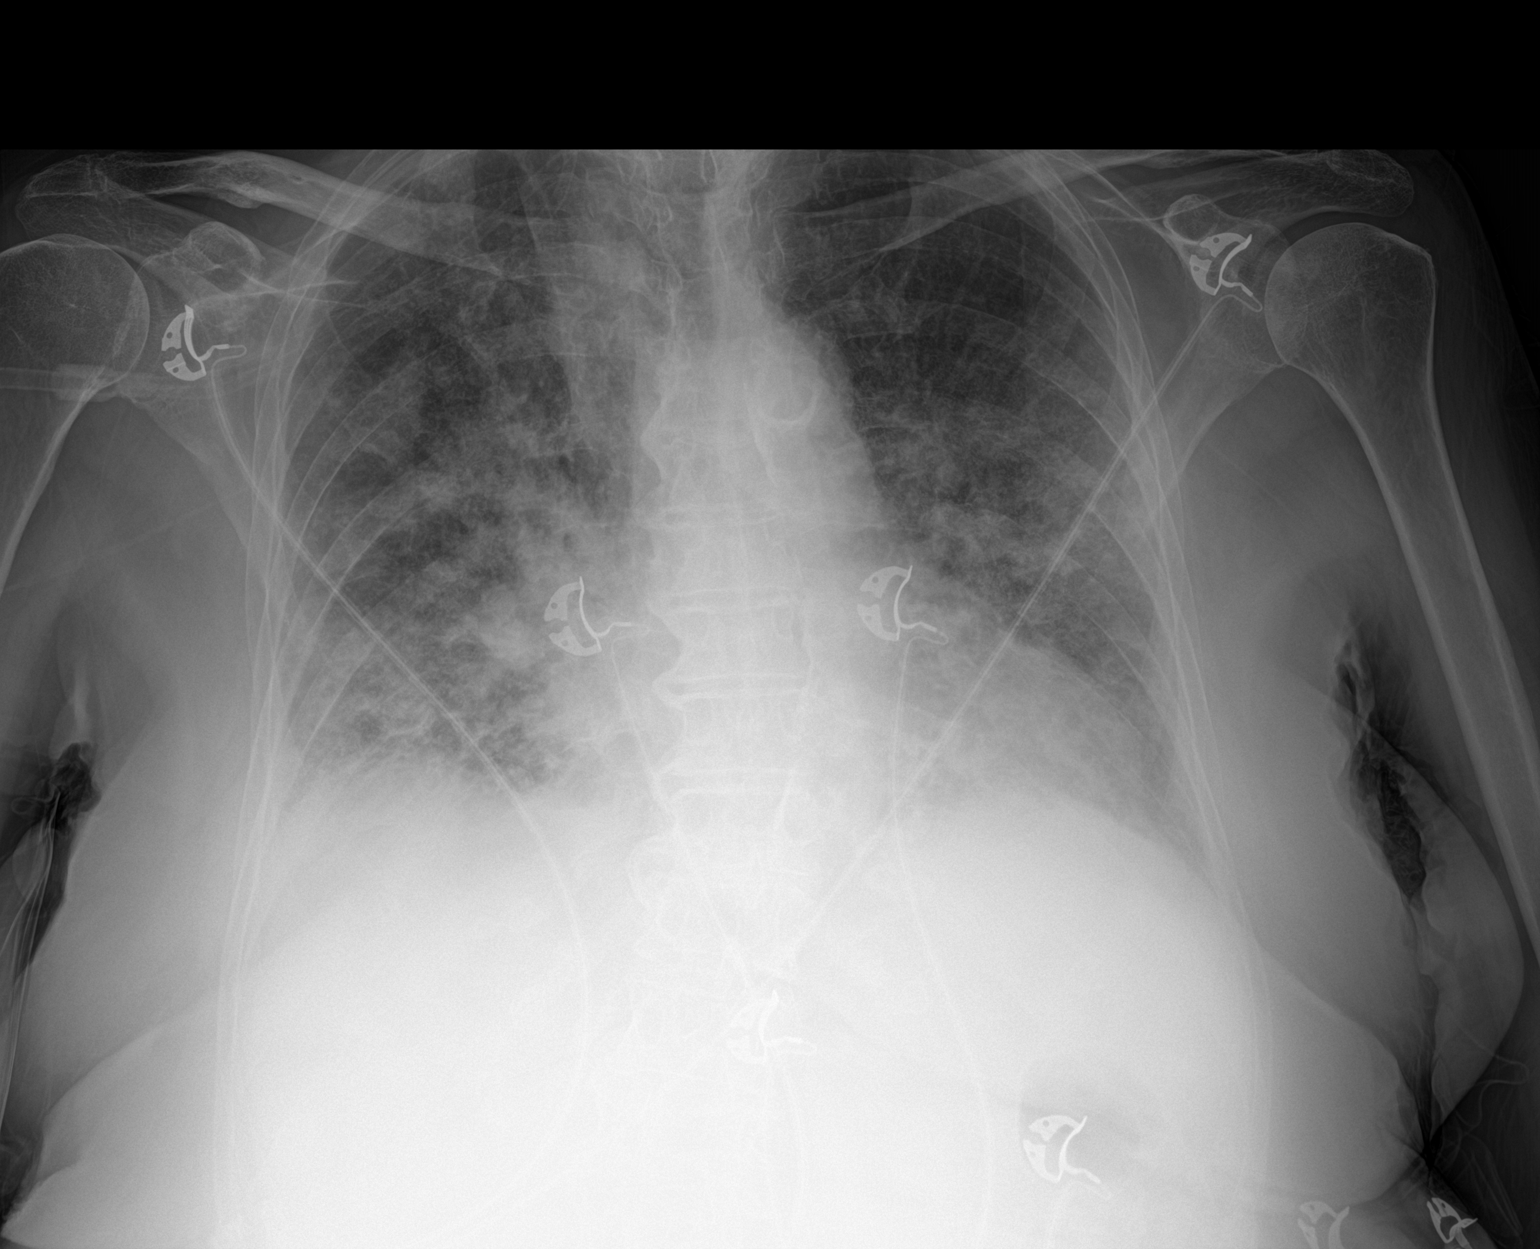

[1 of 1 positions shown; findings below may reference images not displayed]

FINDINGS: Patchy bilateral airspace opacities are noted. Heart is borderline
in size. No visible significant effusions or acute bony abnormality.
IMPRESSION: Patchy bilateral airspace disease concerning for pneumonia.

## 2020-02-05 DIAGNOSIS — H35373 Puckering of macula, bilateral: Secondary | ICD-10-CM | POA: Diagnosis not present

## 2020-02-05 DIAGNOSIS — H353132 Nonexudative age-related macular degeneration, bilateral, intermediate dry stage: Secondary | ICD-10-CM | POA: Diagnosis not present

## 2020-02-05 DIAGNOSIS — H43813 Vitreous degeneration, bilateral: Secondary | ICD-10-CM | POA: Diagnosis not present

## 2020-02-05 DIAGNOSIS — H43393 Other vitreous opacities, bilateral: Secondary | ICD-10-CM | POA: Diagnosis not present

## 2020-02-11 ENCOUNTER — Other Ambulatory Visit: Payer: Self-pay | Admitting: *Deleted

## 2020-02-11 MED ORDER — FUROSEMIDE 20 MG PO TABS: 20.0000 mg | ORAL_TABLET | Freq: Every day | ORAL | 1 refills | Status: DC

## 2020-02-11 NOTE — Telephone Encounter (Signed)
Robert,husband requested refill Pended and sent to Dr Lyndel Safe for approval due to [place]

## 2020-02-11 NOTE — Telephone Encounter (Signed)
Routed to [facility] for approval.

## 2020-03-04 ENCOUNTER — Other Ambulatory Visit: Payer: Self-pay

## 2020-03-04 ENCOUNTER — Ambulatory Visit (INDEPENDENT_AMBULATORY_CARE_PROVIDER_SITE_OTHER): Payer: MEDICARE | Admitting: Podiatry

## 2020-03-04 ENCOUNTER — Encounter: Payer: Self-pay | Admitting: Podiatry

## 2020-03-04 DIAGNOSIS — D689 Coagulation defect, unspecified: Secondary | ICD-10-CM

## 2020-03-04 DIAGNOSIS — M79676 Pain in unspecified toe(s): Secondary | ICD-10-CM | POA: Diagnosis not present

## 2020-03-04 DIAGNOSIS — Q828 Other specified congenital malformations of skin: Secondary | ICD-10-CM

## 2020-03-04 DIAGNOSIS — B351 Tinea unguium: Secondary | ICD-10-CM

## 2020-03-04 NOTE — Progress Notes (Signed)
She presents today chief complaint of painfully elongated toenails 1 through 5 bilaterally.  Also painful calluses plantar aspect of the bilateral foot.  Objective: Pulses are palpable.  Capillary fill time is immediate no open lesions or wounds are noted simple reactive hyperkeratotic tissue plantar aspect of the forefoot bilaterally secondary to fat pad atrophy.  Toenails are long thick yellow dystrophic-like mycotic and painful.  Assessment: Pain in limb secondary to onychomycosis and porokeratosis.  Plan: Debridement of toenails and reactive hyperkeratotic tissue bilaterally.  Follow-up with her in 3 months

## 2020-03-05 NOTE — Progress Notes (Signed)
[place]  [phone] [phone]     ID: NYIESHA BEEVER DOB: 10/04/1921  MR#: [id number]  ESP#:[id number]  Patient Care Team: Virgie Dad, MD as PCP - General (Internal Medicine) Sanda Klein, MD as PCP - Cardiology (Cardiology) Erroll Luna, MD as Consulting Physician (General Surgery) Kawanda Drumheller, Virgie Dad, MD as Consulting Physician (Oncology) Arloa Koh, MD (Inactive) as Consulting Physician (Radiation Oncology) [name], RN as Registered Nurse [name], RN as Registered Nurse [name], NP (Inactive) as Nurse Practitioner (Nurse Practitioner) [name], MD as Consulting Physician (Gastroenterology) OTHER MD:  CHIEF COMPLAINT: Estrogen receptor positive breast cancer  CURRENT TREATMENT: observation   INTERVAL HISTORY: Renarda returns today for for follow-up of her more recent left breast cancer.  Her husband [name] who is turning 89 stayed home to rest.  The patient was brought here by a friend  Jaymarie had a bone density scan that day showing a T score of -1.9.  She has not had a mammogram since her surgery last August.   REVIEW OF SYSTEMS: Orpha and her husband [name] continue to live in the independent section of [facility].  They are aware that at some point they may need to transition and they are trying to stay as active as possible to delay that time since in assisted living they only have single rooms and she tells me her husband [name] really wants to stay in the same room with her as they have in the past 75 years. She had a fall October 2020 but not since.  She uses her walker everywhere except briefly in her apartment sometimes.  She has had the Moderna vaccine x2 and I expect will be receiving a booster shot sometime within the next month.  A detailed review of systems today was otherwise stable.   BREAST CANCER HISTORY: From the earlier summary note:  I saw Vaani previously for a history of  bilateral breast cancers, treated with bilateral lumpectomies and anti-estrogens for 5 years. She did not receive radiation treatments. She was last seen here in 2011.  She had her annual screening mammography at [facility] 08/27/2013, and this showed the breast density to be category B. There was an area of new grouped heterogeneous calcifications in the right breast at the 11:00 middle depth. A focal asymmetric density anterior to that area was seen in one view only. Additional views were recommended, but if they were performed I do not have those records. There appears to have been mammography on 03/05/2014, but again I do not have those records.  On 08/30/2014, bilateral diagnostic mammography with tomosynthesis this was obtained. The calcifications at the 11:00 position in the right breast were increased in number. There were no other significant findings. Biopsy of the area in question to 20 11/06/2014 showed ([id number]) high-grade ductal carcinoma in situ, with mucin extravasation.no definitive invasive component was seen however. This high-grade noninvasive tumor was estrogen receptor positive at 100%, progesterone receptor positive at 44%, both with strong staining intensity.  Her subsequent history is as detailed below   PAST MEDICAL HISTORY: Past Medical History:  Diagnosis Date  . Allergic rhinitis, seasonal   . Breast cancer of upper-outer quadrant of right female breast ([place]) 09/13/2014  . Chronic venous insufficiency   . Dermatophytosis of nail   . Dysrhythmia    H/o Atrial fibrillation  . Edema leg   . Family history of malignant neoplasm of breast   . Family history of malignant  neoplasm of ovary   . Goiter    right thyroid nodule   . HTN (hypertension) 08/13/2017  . HX: breast cancer    bilateral  . Menopausal syndrome   . Osteoarthritis   . Osteoporosis   . Squamous cell skin cancer    right lower leg  . Wears glasses   . Wears hearing aid    both ears    PAST  SURGICAL HISTORY: Past Surgical History:  Procedure Laterality Date  . bilateral lumpectomies for breast cancer  Aug. 2007  . BREAST LUMPECTOMY WITH RADIOACTIVE SEED LOCALIZATION Right 10/01/2014   Procedure: BREAST LUMPECTOMY WITH RADIOACTIVE SEED LOCALIZATION;  Surgeon: Erroll Luna, MD;  Location: [place];  Service: General;  Laterality: Right;  . BREAST LUMPECTOMY WITH RADIOACTIVE SEED LOCALIZATION Left 03/22/2019   Procedure: LEFT BREAST LUMPECTOMY WITH RADIOACTIVE SEED LOCALIZATION;  Surgeon: Erroll Luna, MD;  Location: [place];  Service: General;  Laterality: Left;  . CATARACT EXTRACTION    . CHOLECYSTECTOMY N/A 08/13/2017   Procedure: LAPAROSCOPIC CHOLECYSTECTOMY WITH INTRAOPERATIVE CHOLANGIOGRAM;  Surgeon: Excell Seltzer, MD;  Location: [facility];  Service: General;  Laterality: N/A;  . DILATION AND CURETTAGE OF UTERUS    . ESOPHAGOGASTRODUODENOSCOPY  07/26/2011   Procedure: ESOPHAGOGASTRODUODENOSCOPY (EGD);  Surgeon: Jeryl Columbia, MD;  Location: [facility];  Service: Endoscopy;  Laterality: N/A;  . history of lumbar compression fracture    . left eardum sx  1983  . left knee arthroscopic surgery    . no screening colonoscopy    . s/p bilateral lumpectomies    . SAVORY DILATION  07/26/2011   Procedure: SAVORY DILATION;  Surgeon: Jeryl Columbia, MD;  Location: [facility];  Service: Endoscopy;  Laterality: N/A;  . status post resection squamous cell cancer right lower leg    . TONSILLECTOMY    . UMBILICAL HERNIA REPAIR      FAMILY HISTORY Family History  Problem Relation Age of Onset  . Cancer Mother 72       breast  . Heart attack Father   . Heart attack Brother   . Heart attack Brother   . Cancer Maternal Aunt 55       breast  . Coronary artery disease Other   . Cancer Other        ovarian; daughter of mat aunt w/ BC in 34s  . Heart disease Brother   . Cancer Maternal Aunt 36       breast  the patient's father died at age 30 from a heart attack. The  patient's mother died at age 35. The patient's mother was one of 82 sisters, 9 siblings. 2 of the sisters had breast cancer, postmenopausal. Hazelle Woollard self had 2 half-brothers and 1 full brother as well as one half-sister.There is no history of ovarian cancer in the familyexcept as noted.   GYNECOLOGIC HISTORY:  No LMP recorded. Patient is postmenopausal. Menarche age 54, the patient is GX P0. 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 Her husband [name] is a former Programme researcher, broadcasting/film/video. They currently reside at [facility] [place], with no pets.    ADVANCED DIRECTIVES: in place   HEALTH MAINTENANCE: Social History   Tobacco Use  . Smoking status: Never Smoker  . Smokeless tobacco: Never Used  Vaping Use  . Vaping Use: Never used  Substance Use Topics  .  Alcohol use: No  . Drug use: No     Colonoscopy:  PAP:  Bone density:  Lipid panel:  Allergies  Allergen Reactions  . Celecoxib Swelling    Swelling-leg    Current Outpatient Medications  Medication Sig Dispense Refill  . acetaminophen (TYLENOL) 325 MG tablet Take 2 tablets (650 mg total) by mouth every 6 (six) hours as needed for mild pain (or Fever >/= 101). 30 tablet 0  . atorvastatin (LIPITOR) 40 MG tablet TAKE 1 TABLET BY MOUTH EVERY DAY 90 tablet 0  . ELIQUIS 5 MG TABS tablet TAKE 1 TABLET BY MOUTH TWICE A DAY 180 tablet 1  . furosemide (LASIX) 20 MG tablet Take 1 tablet (20 mg total) by mouth daily. 90 tablet 1  . ipratropium (ATROVENT) 0.06 % nasal spray Place 2 sprays into both nostrils 4 (four) times daily. 15 mL 5  . metoprolol succinate (TOPROL-XL) 25 MG 24 hr tablet Take 25 mg by mouth at bedtime.    . montelukast (SINGULAIR) 10 MG tablet Take one tablet once daily at night for coughing or wheezing. 90 tablet 1  . Multiple Vitamins-Minerals (PRESERVISION AREDS 2)  CAPS Take 1 capsule by mouth 2 (two) times daily.     . pantoprazole (PROTONIX) 40 MG tablet Take 1 tablet (40 mg total) by mouth daily. 90 tablet 1  . Polyethyl Glycol-Propyl Glycol (SYSTANE OP) Place 1 drop into both eyes 2 (two) times daily as needed (dry eyes).     No current facility-administered medications for this visit.    OBJECTIVE: white woman using a walker  Vitals:   03/06/20 1028  BP: (!) 150/38  Pulse: 70  Resp: 18  Temp: 98.7 F (37.1 C)  SpO2: 99%     Body mass index is 29.6 kg/m.    ECOG FS:2 - Symptomatic, <50% confined to bed  Sclerae unicteric, EOMs intact Wearing a mask No cervical or supraclavicular adenopathy Lungs no rales or rhonchi Heart regular rate and rhythm Abd soft, nontender, positive bowel sounds MSK no focal spinal tenderness, no upper extremity lymphedema Neuro: nonfocal, well oriented, appropriate affect Breasts: The right breast is status post remote lumpectomy with no evidence of local recurrence.  The left breast is status post more recent lumpectomy, with no evidence of local recurrence.  Both axillae are benign.  LAB RESULTS:  CMP     Component Value Date/Time   NA 144 11/05/2019 0805   NA 141 07/09/2019 0000   NA 145 09/19/2017 0000   NA 145 09/18/2014 1245   K 4.1 11/05/2019 0805   K 3.7 09/19/2017 0000   K 3.9 09/18/2014 1245   CL 107 11/05/2019 0805   CO2 28 11/05/2019 0805   CO2 25 09/18/2014 1245   GLUCOSE 116 (H) 11/05/2019 0805   GLUCOSE 107 09/18/2014 1245   BUN 22 11/05/2019 0805   BUN 14 07/09/2019 0000   BUN 20.1 09/18/2014 1245   CREATININE 1.04 (H) 11/05/2019 0805   CREATININE 1.2 (H) 09/18/2014 1245   CALCIUM 9.0 11/05/2019 0805   CALCIUM 7.3 09/19/2017 0000   CALCIUM 9.1 09/18/2014 1245   PROT 5.8 (L) 07/30/2019 0853   PROT 5.1 09/19/2017 0000   PROT 6.8 09/18/2014 1245   ALBUMIN 2.4 (A) 07/09/2019 0000   ALBUMIN 2.8 09/19/2017 0000   ALBUMIN 3.8 09/18/2014 1245   AST 18 07/30/2019 0853   AST 20  10/18/2018 1351   AST 14 09/18/2014 1245   ALT 17 07/30/2019 0853   ALT 18  10/18/2018 1351   ALT 23 09/19/2017 0000   ALT 8 09/18/2014 1245   ALKPHOS 78 06/29/2019 0000   ALKPHOS 87 09/19/2017 0000   ALKPHOS 100 09/18/2014 1245   BILITOT 0.5 07/30/2019 0853   BILITOT 0.5 10/18/2018 1351   BILITOT 0.49 09/18/2014 1245   GFRNONAA 42 (L) 08/16/2019 0000   GFRAA 48 (L) 08/16/2019 0000    INo results found for: SPEP, UPEP  Lab Results  Component Value Date   WBC 4.7 11/05/2019   NEUTROABS 2,812 07/30/2019   HGB 12.1 11/05/2019   HCT 37.0 11/05/2019   MCV 91.6 11/05/2019   PLT 177 11/05/2019      Chemistry      Component Value Date/Time   NA 144 11/05/2019 0805   NA 141 07/09/2019 0000   NA 145 09/19/2017 0000   NA 145 09/18/2014 1245   K 4.1 11/05/2019 0805   K 3.7 09/19/2017 0000   K 3.9 09/18/2014 1245   CL 107 11/05/2019 0805   CO2 28 11/05/2019 0805   CO2 25 09/18/2014 1245   BUN 22 11/05/2019 0805   BUN 14 07/09/2019 0000   BUN 20.1 09/18/2014 1245   CREATININE 1.04 (H) 11/05/2019 0805   CREATININE 1.2 (H) 09/18/2014 1245   GLU 123 07/09/2019 0000      Component Value Date/Time   CALCIUM 9.0 11/05/2019 0805   CALCIUM 7.3 09/19/2017 0000   CALCIUM 9.1 09/18/2014 1245   ALKPHOS 78 06/29/2019 0000   ALKPHOS 87 09/19/2017 0000   ALKPHOS 100 09/18/2014 1245   AST 18 07/30/2019 0853   AST 20 10/18/2018 1351   AST 14 09/18/2014 1245   ALT 17 07/30/2019 0853   ALT 18 10/18/2018 1351   ALT 23 09/19/2017 0000   ALT 8 09/18/2014 1245   BILITOT 0.5 07/30/2019 0853   BILITOT 0.5 10/18/2018 1351   BILITOT 0.49 09/18/2014 1245       Lab Results  Component Value Date   LABCA2 34 05/20/2011    No components found for: [id number]  No results for input(s): INR in the last 168 hours.  Urinalysis    Component Value Date/Time   COLORURINE YELLOW 08/12/2017 0627   APPEARANCEUR CLEAR 08/12/2017 0627   LABSPEC 1.014 08/12/2017 0627   PHURINE 6.0 08/12/2017  0627   GLUCOSEU NEGATIVE 08/12/2017 0627   HGBUR SMALL (A) 08/12/2017 0627   HGBUR trace-intact 10/28/2009 0905   BILIRUBINUR NEGATIVE 08/12/2017 0627   KETONESUR NEGATIVE 08/12/2017 0627   PROTEINUR NEGATIVE 08/12/2017 0627   UROBILINOGEN 0.2 10/28/2009 0905   NITRITE NEGATIVE 08/12/2017 0627   LEUKOCYTESUR TRACE (A) 08/12/2017 0627    STUDIES: No results found.    ASSESSMENT: 86 y.o. BRCA negative [place] resident   (1) status post bilateral lumpectomies without sentinel lymph node biopsy in August 2007 for a right-sided 7 mm, grade 2, invasive ductal carcinoma, and a left-sided 8 mm, grade 1, invasive ductal carcinoma.  Both tumors were ER positive, HER-2/neu negative, both with a low proliferation fraction.   (a) On Arimidex from September 2007 until May 2009, at which time we switched to tamoxifen primarily secondary to concerns regarding osteoporosis, completing five years of antiestrogen September 2011.   (b)  Also received Zometa yearly, discontinued after 4 doses in 2011  (2)  Status post right breast upper outer quadrant biopsy 09/11/2014 4 a 4.2 cm area of ductal carcinoma in situ, grade 2 or 3,  with extravasated mucin, estrogen and progesterone receptor positive   (  3) status post right lumpectomy 10/01/2014 for a pT1a pNX, stage IA invasive ductal carcinoma, grade 2, estrogen  and progesterone receptor positive, HER-2 negative, with an MIB-1 of 38%. Margins were close but negative   (4) she did not need adjuvant radiation   (5) opted against anti-estrogens  (a)  DEXA scan 10/31/2014 shows a T score of -1.8.  (6)  the BreastNext gene panel performed at [facility] 09/19/2014 showed no deleterious mutations in ATM, BARD1, BRCA1, BRCA2, BRIP1, CDH1, CHEK2, MRE11A, MUTYH, NBN, NF1, PALB2, PTEN, RAD50, RAD51C, RAD51D, or TP53.  (7) status post left lumpectomy 03/28/2019 for a pT1c pNX, stage IA invasive ductal carcinoma, with negative margins; the tumor was  strongly estrogen and progesterone receptor positive, HER-2 not amplified, with an MIB-1 of 15%.   PLAN: Yoko is now just about a year out from definitive surgery for her more recent breast cancer, with no evidence of disease recurrence.  This is very favorable.  I am setting her up for diagnostic mammography in October.  She will then have that repeated October of next year and I will see her late October after that study.  I commended her care at avoiding falls.  I also think it is wonderful how she and her husband are working hard to try to stay in independent living so long as it is safe for him to do that.  They know to call for any other issue that may develop before the next visit  Total encounter time 25 minutes.*     Starasia Sinko, Virgie Dad, MD  03/06/20 11:06 AM Medical Oncology and Hematology [facility] [place], [place] [address] Tel. [phone]    Fax. [phone]   I, Wilburn Mylar, am acting as scribe for Dr. Virgie Dad. Raahim Shartzer.  I, Lurline Del MD, have reviewed the above documentation for accuracy and completeness, and I agree with the above.   *Total Encounter Time as defined by the Centers for Medicare and Medicaid Services includes, in addition to the face-to-face time of a patient visit (documented in the note above) non-face-to-face time: obtaining and reviewing outside history, ordering and reviewing medications, tests or procedures, care coordination (communications with other health care professionals or caregivers) and documentation in the medical record.

## 2020-03-06 ENCOUNTER — Other Ambulatory Visit: Payer: Self-pay

## 2020-03-06 ENCOUNTER — Telehealth: Payer: Self-pay | Admitting: Oncology

## 2020-03-06 ENCOUNTER — Inpatient Hospital Stay: Payer: MEDICARE | Attending: Oncology | Admitting: Oncology

## 2020-03-06 VITALS — BP 150/38 | HR 70 | Temp 98.7°F | Resp 18 | Ht 63.0 in | Wt 167.1 lb

## 2020-03-06 DIAGNOSIS — C50412 Malignant neoplasm of upper-outer quadrant of left female breast: Secondary | ICD-10-CM | POA: Diagnosis not present

## 2020-03-06 DIAGNOSIS — M810 Age-related osteoporosis without current pathological fracture: Secondary | ICD-10-CM | POA: Diagnosis not present

## 2020-03-06 DIAGNOSIS — I4891 Unspecified atrial fibrillation: Secondary | ICD-10-CM | POA: Diagnosis not present

## 2020-03-06 DIAGNOSIS — Z803 Family history of malignant neoplasm of breast: Secondary | ICD-10-CM | POA: Insufficient documentation

## 2020-03-06 DIAGNOSIS — Z170 Estrogen receptor positive status [ER+]: Secondary | ICD-10-CM | POA: Diagnosis not present

## 2020-03-06 DIAGNOSIS — I10 Essential (primary) hypertension: Secondary | ICD-10-CM | POA: Insufficient documentation

## 2020-03-06 DIAGNOSIS — Z853 Personal history of malignant neoplasm of breast: Secondary | ICD-10-CM | POA: Insufficient documentation

## 2020-03-06 NOTE — Telephone Encounter (Signed)
Scheduled per 8/19 los. Gave pt a print out of AVS.

## 2020-03-17 ENCOUNTER — Other Ambulatory Visit: Payer: Self-pay | Admitting: *Deleted

## 2020-03-17 NOTE — Telephone Encounter (Signed)
Patient husband called for refill on Dr. Victorino December medication he prescribed. Patient husband will call their office for refill.

## 2020-03-18 ENCOUNTER — Other Ambulatory Visit: Payer: Self-pay

## 2020-03-18 ENCOUNTER — Telehealth: Payer: Self-pay | Admitting: Allergy and Immunology

## 2020-03-18 MED ORDER — MONTELUKAST SODIUM 10 MG PO TABS: ORAL_TABLET | ORAL | 1 refills | Status: DC

## 2020-03-18 NOTE — Telephone Encounter (Signed)
Refill sent in

## 2020-03-18 NOTE — Telephone Encounter (Signed)
Patient called and needs to have singulair 10mg  called into [facility] on [address]. [phone]

## 2020-03-27 ENCOUNTER — Other Ambulatory Visit: Payer: Self-pay | Admitting: Cardiovascular Disease

## 2020-04-02 DIAGNOSIS — I5032 Chronic diastolic (congestive) heart failure: Secondary | ICD-10-CM | POA: Diagnosis not present

## 2020-04-02 DIAGNOSIS — R7303 Prediabetes: Secondary | ICD-10-CM | POA: Diagnosis not present

## 2020-04-03 ENCOUNTER — Other Ambulatory Visit: Payer: Self-pay

## 2020-04-03 DIAGNOSIS — I5032 Chronic diastolic (congestive) heart failure: Secondary | ICD-10-CM

## 2020-04-03 DIAGNOSIS — R7303 Prediabetes: Secondary | ICD-10-CM

## 2020-04-04 LAB — CBC WITH DIFFERENTIAL/PLATELET
Absolute Monocytes: 398 cells/uL (ref 200–950)
Basophils Absolute: 41 cells/uL (ref 0–200)
Basophils Relative: 0.8 %
Eosinophils Absolute: 51 cells/uL (ref 15–500)
Eosinophils Relative: 1 %
HCT: 36.4 % (ref 35.0–45.0)
Hemoglobin: 12.2 g/dL (ref 11.7–15.5)
Lymphs Abs: 1250 cells/uL (ref 850–3900)
MCH: 30.8 pg (ref 27.0–33.0)
MCHC: 33.5 g/dL (ref 32.0–36.0)
MCV: 91.9 fL (ref 80.0–100.0)
MPV: 11.7 fL (ref 7.5–12.5)
Monocytes Relative: 7.8 %
Neutro Abs: 3361 cells/uL (ref 1500–7800)
Neutrophils Relative %: 65.9 %
Platelets: 177 10*3/uL (ref 140–400)
RBC: 3.96 10*6/uL (ref 3.80–5.10)
RDW: 12.8 % (ref 11.0–15.0)
Total Lymphocyte: 24.5 %
WBC: 5.1 10*3/uL (ref 3.8–10.8)

## 2020-04-04 LAB — COMPLETE METABOLIC PANEL WITH GFR
AG Ratio: 1.9 (calc) (ref 1.0–2.5)
ALT: 15 U/L (ref 6–29)
AST: 15 U/L (ref 10–35)
Albumin: 3.8 g/dL (ref 3.6–5.1)
Alkaline phosphatase (APISO): 113 U/L (ref 37–153)
BUN/Creatinine Ratio: 27 (calc) — ABNORMAL HIGH (ref 6–22)
BUN: 28 mg/dL — ABNORMAL HIGH (ref 7–25)
CO2: 25 mmol/L (ref 20–32)
Calcium: 8.5 mg/dL — ABNORMAL LOW (ref 8.6–10.4)
Chloride: 109 mmol/L (ref 98–110)
Creat: 1.02 mg/dL — ABNORMAL HIGH (ref 0.60–0.88)
GFR, Est African American: 54 mL/min/{1.73_m2} — ABNORMAL LOW (ref >=60)
GFR, Est Non African American: 46 mL/min/{1.73_m2} — ABNORMAL LOW (ref >=60)
Globulin: 2 g/dL (calc) (ref 1.9–3.7)
Glucose, Bld: 116 mg/dL — ABNORMAL HIGH (ref 65–99)
Potassium: 4.1 mmol/L (ref 3.5–5.3)
Sodium: 144 mmol/L (ref 135–146)
Total Bilirubin: 0.5 mg/dL (ref 0.2–1.2)
Total Protein: 5.8 g/dL — ABNORMAL LOW (ref 6.1–8.1)

## 2020-04-04 LAB — HEMOGLOBIN A1C
Hgb A1c MFr Bld: 5.8 % of total Hgb — ABNORMAL HIGH (ref <5.7)
Mean Plasma Glucose: 120 (calc)
eAG (mmol/L): 6.6 (calc)

## 2020-04-09 ENCOUNTER — Non-Acute Institutional Stay: Payer: MEDICARE | Admitting: Internal Medicine

## 2020-04-09 ENCOUNTER — Other Ambulatory Visit: Payer: Self-pay

## 2020-04-09 ENCOUNTER — Encounter: Payer: Self-pay | Admitting: Internal Medicine

## 2020-04-09 VITALS — BP 130/56 | HR 75 | Temp 97.1°F | Ht 63.0 in | Wt 163.4 lb

## 2020-04-09 DIAGNOSIS — E782 Mixed hyperlipidemia: Secondary | ICD-10-CM

## 2020-04-09 DIAGNOSIS — I4891 Unspecified atrial fibrillation: Secondary | ICD-10-CM | POA: Diagnosis not present

## 2020-04-09 DIAGNOSIS — I10 Essential (primary) hypertension: Secondary | ICD-10-CM | POA: Diagnosis not present

## 2020-04-09 DIAGNOSIS — R600 Localized edema: Secondary | ICD-10-CM

## 2020-04-09 DIAGNOSIS — I5032 Chronic diastolic (congestive) heart failure: Secondary | ICD-10-CM

## 2020-04-09 DIAGNOSIS — R7303 Prediabetes: Secondary | ICD-10-CM

## 2020-04-09 NOTE — Progress Notes (Signed)
Location:  [place] of Service:  Clinic (12)  Provider:   Code Status:  Goals of Care:  Advanced Directives 06/20/2019  Does Patient Have a Medical Advance Directive? No  Type of Advance Directive -  Does patient want to make changes to medical advance directive? -  Copy of [place] in Chart? -  Would patient like information on creating a medical advance directive? No - Patient declined  Pre-existing out of facility DNR order (yellow form or pink MOST form) -     Chief Complaint  Patient presents with  . Medical Management of Chronic Issues    Patient returns to the clinic for follow up.     HPI: Patient is a 86 y.o. female seen today for medical management of chronic diseases.    Patient has a history of breast cancer s/p lumpectomy, A. fib on chronic Eliquis, coronary artery disease, hypertension, hyperlipidemia, LE edema Admitted inhospital from 12/1-12/9 forCovid   Doing well. No Complains today Walks with the walker Takes Lasix extra for LE Edema No recent falls. Lives with her husband in [place].  Appetite good.  Past Medical History:  Diagnosis Date  . Allergic rhinitis, seasonal   . Breast cancer of upper-outer quadrant of right female breast ([place]) 09/13/2014  . Chronic venous insufficiency   . Dermatophytosis of nail   . Dysrhythmia    H/o Atrial fibrillation  . Edema leg   . Family history of malignant neoplasm of breast   . Family history of malignant neoplasm of ovary   . Goiter    right thyroid nodule   . HTN (hypertension) 08/13/2017  . HX: breast cancer    bilateral  . Menopausal syndrome   . Osteoarthritis   . Osteoporosis   . Squamous cell skin cancer    right lower leg  . Wears glasses   . Wears hearing aid    both ears    Past Surgical History:  Procedure Laterality Date  . bilateral lumpectomies for breast cancer  Aug. 2007  . BREAST LUMPECTOMY WITH RADIOACTIVE SEED LOCALIZATION Right 10/01/2014    Procedure: BREAST LUMPECTOMY WITH RADIOACTIVE SEED LOCALIZATION;  Surgeon: Erroll Luna, MD;  Location: [place];  Service: General;  Laterality: Right;  . BREAST LUMPECTOMY WITH RADIOACTIVE SEED LOCALIZATION Left 03/22/2019   Procedure: LEFT BREAST LUMPECTOMY WITH RADIOACTIVE SEED LOCALIZATION;  Surgeon: Erroll Luna, MD;  Location: [facility];  Service: General;  Laterality: Left;  . CATARACT EXTRACTION    . CHOLECYSTECTOMY N/A 08/13/2017   Procedure: LAPAROSCOPIC CHOLECYSTECTOMY WITH INTRAOPERATIVE CHOLANGIOGRAM;  Surgeon: Excell Seltzer, MD;  Location: [facility];  Service: General;  Laterality: N/A;  . DILATION AND CURETTAGE OF UTERUS    . ESOPHAGOGASTRODUODENOSCOPY  07/26/2011   Procedure: ESOPHAGOGASTRODUODENOSCOPY (EGD);  Surgeon: Jeryl Columbia, MD;  Location: [facility];  Service: Endoscopy;  Laterality: N/A;  . history of lumbar compression fracture    . left eardum sx  1983  . left knee arthroscopic surgery    . no screening colonoscopy    . s/p bilateral lumpectomies    . SAVORY DILATION  07/26/2011   Procedure: SAVORY DILATION;  Surgeon: Jeryl Columbia, MD;  Location: [facility];  Service: Endoscopy;  Laterality: N/A;  . status post resection squamous cell cancer right lower leg    . TONSILLECTOMY    . UMBILICAL HERNIA REPAIR      Allergies  Allergen Reactions  . Celecoxib Swelling  Swelling-leg    Outpatient Encounter Medications as of 04/09/2020  Medication Sig  . atorvastatin (LIPITOR) 40 MG tablet TAKE 1 TABLET BY MOUTH EVERY DAY  . ELIQUIS 5 MG TABS tablet TAKE 1 TABLET BY MOUTH TWICE A DAY  . furosemide (LASIX) 20 MG tablet Take 1 tablet (20 mg total) by mouth daily.  Marland Kitchen ipratropium (ATROVENT) 0.06 % nasal spray Place 2 sprays into both nostrils 4 (four) times daily.  . metoprolol succinate (TOPROL-XL) 25 MG 24 hr tablet Take 25 mg by mouth at bedtime.  . montelukast (SINGULAIR) 10 MG tablet Take one tablet once daily at night for coughing or wheezing.    . Multiple Vitamins-Minerals (PRESERVISION AREDS 2) CAPS Take 1 capsule by mouth 2 (two) times daily.   . pantoprazole (PROTONIX) 40 MG tablet Take 1 tablet (40 mg total) by mouth daily.  Vladimir Faster Glycol-Propyl Glycol (SYSTANE OP) Place 1 drop into both eyes 2 (two) times daily as needed (dry eyes).  . [DISCONTINUED] acetaminophen (TYLENOL) 325 MG tablet Take 2 tablets (650 mg total) by mouth every 6 (six) hours as needed for mild pain (or Fever >/= 101).   No facility-administered encounter medications on file as of 04/09/2020.    Review of Systems:  Review of Systems  Review of Systems  Constitutional: Negative for activity change, appetite change, chills, diaphoresis, fatigue and fever.  HENT: Negative for mouth sores, postnasal drip, rhinorrhea, sinus pain and sore throat.   Respiratory: Negative for apnea, cough, chest tightness, shortness of breath and wheezing.   Cardiovascular: Negative for chest pain, palpitations and leg swelling.  Gastrointestinal: Negative for abdominal distention, abdominal pain, constipation, diarrhea, nausea and vomiting.  Genitourinary: Negative for dysuria and frequency.  Musculoskeletal: Negative for arthralgias, joint swelling and myalgias.  Skin: Negative for rash.  Neurological: Negative for dizziness, syncope, weakness, light-headedness and numbness.  Psychiatric/Behavioral: Negative for behavioral problems, confusion and sleep disturbance.     Health Maintenance  Topic Date Due  . COVID-19 Vaccine (1) Never done  . MAMMOGRAM  07/25/2019  . TETANUS/TDAP  05/04/2023  . INFLUENZA VACCINE  Completed  . DEXA SCAN  Completed  . PNA vac Low Risk Adult  Completed    Physical Exam: Vitals:   04/09/20 1421  BP: (!) 130/56  Pulse: 75  Temp: (!) 97.1 F (36.2 C)  SpO2: 95%  Weight: 163 lb 6.4 oz (74.1 kg)  Height: 5\' 3"  (1.6 m)   Body mass index is 28.95 kg/m. Physical Exam  Constitutional: Oriented to person, place, and time.  Well-developed and well-nourished.  HENT:  Head: Normocephalic.  Mouth/Throat: Oropharynx is clear and moist.  Eyes: Pupils are equal, round, and reactive to light.  Neck: Neck supple.  Cardiovascular: Normal rate and normal heart sounds.  No murmur heard. Pulmonary/Chest: Effort normal and breath sounds normal. No respiratory distress. No wheezes. She has no rales.  Abdominal: Soft. Bowel sounds are normal. No distension. There is no tenderness. There is no rebound.  Musculoskeletal:Edema with Chronic changes Bilateral Lymphadenopathy: none Neurological: Alert and oriented to person, place, and time.  Stood up with no issues. Gait not stable without the walker  Skin: Skin is warm and dry.  Psychiatric: Normal mood and affect. Behavior is normal. Thought content normal.    Labs reviewed: Basic Metabolic Panel: Recent Labs    06/22/19 0343 06/22/19 0343 06/23/19 0315 06/23/19 0315 06/24/19 0417 06/24/19 0417 06/26/19 0302 06/27/19 0346 06/29/19 0000 07/09/19 0000 08/16/19 0000 11/05/19 0805 04/02/20 0950  NA 140   < >  142   < > 142   < > 136   < > 140   < > 143 144 144  K 4.1   < > 4.5   < > 4.6   < > 5.2*   < > 4.5   < > 4.0 4.1 4.1  CL 108   < > 108   < > 106   < > 100   < > 103   < > 108 107 109  CO2 21*   < > 22   < > 25   < > 25   < > 29*   < > 26 28 25   GLUCOSE 189*   < > 206*   < > 240*   < > 192*   < >  --    < > 110* 116* 116*  BUN 35*   < > 36*   < > 36*   < > 43*   < > 38*   < > 33* 22 28*  CREATININE 0.90   < > 0.95   < > 0.97   < > 0.89   < > 1.0   < > 1.12* 1.04* 1.02*  CALCIUM 8.2*   < > 8.6*   < > 9.0   < > 8.9   < > 8.0*   < > 8.1* 9.0 8.5*  MG 2.0   < > 2.2   < > 2.1  --  1.9  --  1.9  --   --   --   --   PHOS 2.8  --  2.9  --  3.4  --   --   --   --   --   --   --   --   TSH  --   --   --   --   --   --   --   --   --   --   --  5.39*  --    < > = values in this interval not displayed.   Liver Function Tests: Recent Labs    06/26/19 0302  06/26/19 0302 06/27/19 0346 06/27/19 0346 06/29/19 0000 07/09/19 0000 07/30/19 0853 04/02/20 0950  AST 21   < > 27   < > 28  --  18 15  ALT 54*   < > 51*   < > 56*  --  17 15  ALKPHOS 81  --  78  --  78  --   --   --   BILITOT 0.7   < > 0.5  --   --   --  0.5 0.5  PROT 5.5*   < > 5.3*  --   --   --  5.8* 5.8*  ALBUMIN 2.4*   < > 2.3*  --  2.6* 2.4*  --   --    < > = values in this interval not displayed.   No results for input(s): LIPASE, AMYLASE in the last 8760 hours. No results for input(s): AMMONIA in the last 8760 hours. CBC: Recent Labs    06/29/19 0000 07/09/19 0000 07/30/19 0853 11/05/19 0805 04/02/20 0950  WBC 7.9   < > 4.4 4.7 5.1  NEUTROABS 5,949  --  2,812  --  3,361  HGB 11.8*   < > 10.7* 12.1 12.2  HCT 36   < > 33.3* 37.0 36.4  MCV  --   --  94.3 91.6 91.9  PLT 252   < >  195 177 177   < > = values in this interval not displayed.   Lipid Panel: Recent Labs    06/19/19 2155 07/30/19 0853  CHOL  --  146  HDL  --  49*  LDLCALC  --  77  TRIG 59 110  CHOLHDL  --  3.0   Lab Results  Component Value Date   HGBA1C 5.8 (H) 04/02/2020    Procedures since last visit: No results found.  Assessment/Plan Chronic diastolic heart failure (HCC) Doing well on Lasix Bun and Creat stable Atrial fibrillation with RVR (HCC) Eliquis and Metoprolol Bilateral leg edema Can take extra lasix PRN Mixed hyperlipidemia LDL good on Lipitor Prediabetes A1C less then 6 Essential hypertension Controlled Bilateral Mastectomy Follows with Dr Jana Hakim Per his notes no Adjuvant therapy Just Survelliance Schedeuled for Mammogram  Labs/tests ordered:  * No order type specified * Next appt:  08/06/2020

## 2020-04-28 ENCOUNTER — Other Ambulatory Visit: Payer: Self-pay | Admitting: *Deleted

## 2020-04-28 NOTE — Telephone Encounter (Signed)
Patient husband called requesting refill  Pended Rx and sent to Dr. Lyndel Safe for approval due to [place] Warning.

## 2020-04-29 MED ORDER — FUROSEMIDE 20 MG PO TABS
20.0000 mg | ORAL_TABLET | Freq: Every day | ORAL | 1 refills | Status: DC
Start: 2020-04-29 — End: 2020-10-28

## 2020-05-01 ENCOUNTER — Encounter: Payer: Self-pay | Admitting: Internal Medicine

## 2020-05-01 DIAGNOSIS — Z853 Personal history of malignant neoplasm of breast: Secondary | ICD-10-CM | POA: Diagnosis not present

## 2020-05-05 ENCOUNTER — Other Ambulatory Visit: Payer: Self-pay | Admitting: Internal Medicine

## 2020-05-22 ENCOUNTER — Other Ambulatory Visit: Payer: Self-pay | Admitting: *Deleted

## 2020-05-22 MED ORDER — PANTOPRAZOLE SODIUM 40 MG PO TBEC
40.0000 mg | DELAYED_RELEASE_TABLET | Freq: Every day | ORAL | 1 refills | Status: DC
Start: 2020-05-22 — End: 2020-12-24

## 2020-05-22 NOTE — Telephone Encounter (Signed)
Herbie Baltimore, Husband requested refill

## 2020-05-28 DIAGNOSIS — H353132 Nonexudative age-related macular degeneration, bilateral, intermediate dry stage: Secondary | ICD-10-CM | POA: Diagnosis not present

## 2020-05-28 DIAGNOSIS — Z961 Presence of intraocular lens: Secondary | ICD-10-CM | POA: Diagnosis not present

## 2020-05-28 DIAGNOSIS — H02121 Mechanical ectropion of right upper eyelid: Secondary | ICD-10-CM | POA: Diagnosis not present

## 2020-05-28 DIAGNOSIS — H04123 Dry eye syndrome of bilateral lacrimal glands: Secondary | ICD-10-CM | POA: Diagnosis not present

## 2020-06-03 ENCOUNTER — Ambulatory Visit (INDEPENDENT_AMBULATORY_CARE_PROVIDER_SITE_OTHER): Payer: MEDICARE | Admitting: Podiatry

## 2020-06-03 ENCOUNTER — Encounter: Payer: Self-pay | Admitting: Podiatry

## 2020-06-03 ENCOUNTER — Other Ambulatory Visit: Payer: Self-pay

## 2020-06-03 DIAGNOSIS — Q828 Other specified congenital malformations of skin: Secondary | ICD-10-CM | POA: Diagnosis not present

## 2020-06-03 DIAGNOSIS — D689 Coagulation defect, unspecified: Secondary | ICD-10-CM | POA: Diagnosis not present

## 2020-06-03 DIAGNOSIS — B351 Tinea unguium: Secondary | ICD-10-CM | POA: Diagnosis not present

## 2020-06-03 DIAGNOSIS — M79676 Pain in unspecified toe(s): Secondary | ICD-10-CM | POA: Diagnosis not present

## 2020-06-03 NOTE — Progress Notes (Signed)
She presents today for follow-up of her painful toenails and calluses bilaterally.  Objective: Vital signs are stable alert and oriented x3.  Pulses are palpable.  Toenails are long thick yellow dystrophic-like mycotic severe loss of fat pad beneath the metatarsals resulting in reactive hyperkeratotic lesions.  Assessment: Pain in limb secondary to onychomycosis and porokeratosis.  Plan: Debridement of all reactive hyperkeratotic tissues bilaterally.  Debridement of toenails 1 through 5 bilateral.  Follow-up with her in 3 months

## 2020-07-08 ENCOUNTER — Other Ambulatory Visit: Payer: Self-pay | Admitting: *Deleted

## 2020-07-08 MED ORDER — ATORVASTATIN CALCIUM 40 MG PO TABS
40.0000 mg | ORAL_TABLET | Freq: Every day | ORAL | 1 refills | Status: DC
Start: 2020-07-08 — End: 2020-10-06

## 2020-07-08 NOTE — Telephone Encounter (Signed)
Patient husband requested refill.  

## 2020-07-17 ENCOUNTER — Telehealth: Payer: Self-pay | Admitting: *Deleted

## 2020-07-17 NOTE — Telephone Encounter (Signed)
Assistance for eliquis has been faxed.  °

## 2020-08-04 ENCOUNTER — Other Ambulatory Visit: Payer: Self-pay | Admitting: Nurse Practitioner

## 2020-08-06 ENCOUNTER — Other Ambulatory Visit: Payer: Self-pay

## 2020-08-06 ENCOUNTER — Encounter: Payer: Self-pay | Admitting: Internal Medicine

## 2020-08-06 ENCOUNTER — Non-Acute Institutional Stay: Payer: MEDICARE | Admitting: Internal Medicine

## 2020-08-06 VITALS — BP 120/76 | HR 79 | Temp 96.7°F | Ht 63.0 in | Wt 172.1 lb

## 2020-08-06 DIAGNOSIS — E782 Mixed hyperlipidemia: Secondary | ICD-10-CM | POA: Diagnosis not present

## 2020-08-06 DIAGNOSIS — I5032 Chronic diastolic (congestive) heart failure: Secondary | ICD-10-CM

## 2020-08-06 DIAGNOSIS — R7303 Prediabetes: Secondary | ICD-10-CM

## 2020-08-06 DIAGNOSIS — I4891 Unspecified atrial fibrillation: Secondary | ICD-10-CM | POA: Diagnosis not present

## 2020-08-06 DIAGNOSIS — I10 Essential (primary) hypertension: Secondary | ICD-10-CM | POA: Diagnosis not present

## 2020-08-06 DIAGNOSIS — R600 Localized edema: Secondary | ICD-10-CM | POA: Diagnosis not present

## 2020-08-06 NOTE — Progress Notes (Signed)
Location:  [place] of Service:  Clinic (12)  Provider:   Code Status:  Goals of Care:  Advanced Directives 06/20/2019  Does Patient Have a Medical Advance Directive? No  Type of Advance Directive -  Does patient want to make changes to medical advance directive? -  Copy of [place] in Chart? -  Would patient like information on creating a medical advance directive? No - Patient declined  Pre-existing out of facility DNR order (yellow form or pink MOST form) -     Chief Complaint  Patient presents with   Medical Management of Chronic Issues    Patient returns to the clinic for follow up.     HPI: Patient is a 87 y.o. female seen today for medical management of chronic diseases.    Patient has a history of breast cancer s/p lumpectomy,No Further work up  A. fib on chronic Eliquis, coronary artery disease, hypertension, hyperlipidemia, LE edema H/O Covid infection  Her only complain is LE edema. Also has gained weight  Wants to know if she can take extra dose of Lasix NO SOB or Cough  NO Falls Lives with her husband. Independent in her ADLS  Walks with the walker Past Medical History:  Diagnosis Date   Allergic rhinitis, seasonal    Breast cancer of upper-outer quadrant of right female breast ([place]) 09/13/2014   Chronic venous insufficiency    Dermatophytosis of nail    Dysrhythmia    H/o Atrial fibrillation   Edema leg    Family history of malignant neoplasm of breast    Family history of malignant neoplasm of ovary    Goiter    right thyroid nodule    HTN (hypertension) 08/13/2017   HX: breast cancer    bilateral   Menopausal syndrome    Osteoarthritis    Osteoporosis    Squamous cell skin cancer    right lower leg   Wears glasses    Wears hearing aid    both ears    Past Surgical History:  Procedure Laterality Date   bilateral lumpectomies for breast cancer  Aug. 2007   BREAST LUMPECTOMY WITH  RADIOACTIVE SEED LOCALIZATION Right 10/01/2014   Procedure: BREAST LUMPECTOMY WITH RADIOACTIVE SEED LOCALIZATION;  Surgeon: Erroll Luna, MD;  Location: [facility];  Service: General;  Laterality: Right;   BREAST LUMPECTOMY WITH RADIOACTIVE SEED LOCALIZATION Left 03/22/2019   Procedure: LEFT BREAST LUMPECTOMY WITH RADIOACTIVE SEED LOCALIZATION;  Surgeon: Erroll Luna, MD;  Location: [facility];  Service: General;  Laterality: Left;   CATARACT EXTRACTION     CHOLECYSTECTOMY N/A 08/13/2017   Procedure: LAPAROSCOPIC CHOLECYSTECTOMY WITH INTRAOPERATIVE CHOLANGIOGRAM;  Surgeon: Excell Seltzer, MD;  Location: [facility];  Service: General;  Laterality: N/A;   DILATION AND CURETTAGE OF UTERUS     ESOPHAGOGASTRODUODENOSCOPY  07/26/2011   Procedure: ESOPHAGOGASTRODUODENOSCOPY (EGD);  Surgeon: Jeryl Columbia, MD;  Location: [facility];  Service: Endoscopy;  Laterality: N/A;   history of lumbar compression fracture     left eardum sx  1983   left knee arthroscopic surgery     no screening colonoscopy     s/p bilateral lumpectomies     SAVORY DILATION  07/26/2011   Procedure: SAVORY DILATION;  Surgeon: Jeryl Columbia, MD;  Location: [facility];  Service: Endoscopy;  Laterality: N/A;   status post resection squamous cell cancer right lower leg     TONSILLECTOMY     UMBILICAL HERNIA REPAIR  Allergies  Allergen Reactions   Celecoxib Swelling    Swelling-leg    Outpatient Encounter Medications as of 08/06/2020  Medication Sig   atorvastatin (LIPITOR) 40 MG tablet Take 1 tablet (40 mg total) by mouth daily.   ELIQUIS 5 MG TABS tablet TAKE 1 TABLET BY MOUTH TWICE A DAY   furosemide (LASIX) 20 MG tablet Take 1 tablet (20 mg total) by mouth daily.   ipratropium (ATROVENT) 0.06 % nasal spray Place 2 sprays into both nostrils 4 (four) times daily.   metoprolol succinate (TOPROL-XL) 25 MG 24 hr tablet Take 25 mg by mouth at bedtime.   montelukast (SINGULAIR) 10 MG tablet  Take one tablet once daily at night for coughing or wheezing.   Multiple Vitamins-Minerals (PRESERVISION AREDS 2) CAPS Take 1 capsule by mouth 2 (two) times daily.    pantoprazole (PROTONIX) 40 MG tablet Take 1 tablet (40 mg total) by mouth daily.   Polyethyl Glycol-Propyl Glycol (SYSTANE OP) Place 1 drop into both eyes 2 (two) times daily as needed (dry eyes).   No facility-administered encounter medications on file as of 08/06/2020.    Review of Systems:  Review of Systems  Review of Systems  Constitutional: Negative for activity change, appetite change, chills, diaphoresis, fatigue and fever.  HENT: Negative for mouth sores, postnasal drip, rhinorrhea, sinus pain and sore throat.   Respiratory: Negative for apnea, cough, chest tightness, shortness of breath and wheezing.   Cardiovascular: Negative for chest pain, palpitations   Gastrointestinal: Negative for abdominal distention, abdominal pain, constipation, diarrhea, nausea and vomiting.  Genitourinary: Negative for dysuria and frequency.  Musculoskeletal: Negative for arthralgias, joint swelling and myalgias.  Skin: Negative for rash.  Neurological: Negative for dizziness, syncope, weakness, light-headedness and numbness.  Psychiatric/Behavioral: Negative for behavioral problems, confusion and sleep disturbance.     Health Maintenance  Topic Date Due   COVID-19 Vaccine (3 - Moderna risk 4-dose series) 10/15/2019   MAMMOGRAM  10/30/2020   TETANUS/TDAP  05/04/2023   INFLUENZA VACCINE  Completed   DEXA SCAN  Completed   PNA vac Low Risk Adult  Completed    Physical Exam: Vitals:   08/06/20 1410  BP: 120/76  Pulse: 79  Temp: (!) 96.7 F (35.9 C)  SpO2: 98%  Weight: 172 lb 1.6 oz (78.1 kg)  Height: 5\' 3"  (1.6 m)   Body mass index is 30.49 kg/m. Physical Exam  Constitutional: Oriented to person, place, and time. Well-developed and well-nourished.  HENT:  Head: Normocephalic.  Mouth/Throat: Oropharynx is  clear and moist.  Eyes: Pupils are equal, round, and reactive to light.  Neck: Neck supple.  Cardiovascular: Normal rate and normal heart sounds.  No murmur heard. Pulmonary/Chest: Effort normal and breath sounds normal. No respiratory distress. No wheezes. She has no rales.  Abdominal: Soft. Bowel sounds are normal. No distension. There is no tenderness. There is no rebound.  Musculoskeletal: Bilateral Edema present Lymphadenopathy: none Neurological:  Alertand oriented to person, place, and time.  Stood up with no issues. Gait not stable without the walker  Skin: Skin is warm and dry.  Psychiatric: Normal mood and affect. Behavior is normal. Thought content normal.    Labs reviewed: Basic Metabolic Panel: Recent Labs    08/16/19 0000 11/05/19 0805 04/02/20 0950  NA 143 144 144  K 4.0 4.1 4.1  CL 108 107 109  CO2 26 28 25   GLUCOSE 110* 116* 116*  BUN 33* 22 28*  CREATININE 1.12* 1.04* 1.02*  CALCIUM 8.1* 9.0 8.5*  TSH  --  5.39*  --    Liver Function Tests: Recent Labs    04/02/20 0950  AST 15  ALT 15  BILITOT 0.5  PROT 5.8*   No results for input(s): LIPASE, AMYLASE in the last 8760 hours. No results for input(s): AMMONIA in the last 8760 hours. CBC: Recent Labs    11/05/19 0805 04/02/20 0950  WBC 4.7 5.1  NEUTROABS  --  3,361  HGB 12.1 12.2  HCT 37.0 36.4  MCV 91.6 91.9  PLT 177 177   Lipid Panel: No results for input(s): CHOL, HDL, LDLCALC, TRIG, CHOLHDL, LDLDIRECT in the last 8760 hours. Lab Results  Component Value Date   HGBA1C 5.8 (H) 04/02/2020    Procedures since last visit: No results found.  Assessment/Plan 1. Bilateral leg edema Can take Extra lasix 2-3 times a week If she needs it everyday will let us know to follow  Also Takes Potassium with her Lasix - CBC with Differential/Platelet; Future - COMPLETE METABOLIC PANEL WITH GFR; Future  2. Chronic diastolic heart failure (HCC) On Lasix - COMPLETE METABOLIC PANEL WITH GFR;  Future  3. Atrial fibrillation with RVR (HCC) On Eliquis and Metoprolol - CBC with Differential/Platelet; Future  4. Mixed hyperlipidemia On Statin - Lipid panel; Future  5. Prediabetes Controlled - TSH; Future - Hemoglobin A1c; Future  6. Essential hypertension Controlled  Bilateral Mastectomy Follows with Dr Jana Hakim Per his notes no Adjuvant therapy Just Survelliance Schedeuled for Mammogram GERD On Protonix Labs/tests ordered:  * No order type specified * Next appt:  08/12/2020

## 2020-08-06 NOTE — Patient Instructions (Signed)
Take extra Lasix 20 mg that is 40 mg if needed 2 -3 times a week. If you Need lasix everyday then let me know so I can do blood work to check you kidney test Also take extra potassium the day when you take extra Lasix.

## 2020-08-12 ENCOUNTER — Encounter: Payer: Self-pay | Admitting: Nurse Practitioner

## 2020-08-12 ENCOUNTER — Encounter: Payer: MEDICARE | Admitting: Nurse Practitioner

## 2020-08-12 ENCOUNTER — Telehealth: Payer: Self-pay

## 2020-08-12 ENCOUNTER — Other Ambulatory Visit: Payer: Self-pay

## 2020-08-12 ENCOUNTER — Ambulatory Visit (INDEPENDENT_AMBULATORY_CARE_PROVIDER_SITE_OTHER): Payer: MEDICARE | Admitting: Nurse Practitioner

## 2020-08-12 DIAGNOSIS — Z0000 Encounter for general adult medical examination without abnormal findings: Secondary | ICD-10-CM | POA: Diagnosis not present

## 2020-08-12 DIAGNOSIS — D3132 Benign neoplasm of left choroid: Secondary | ICD-10-CM | POA: Diagnosis not present

## 2020-08-12 DIAGNOSIS — H35373 Puckering of macula, bilateral: Secondary | ICD-10-CM | POA: Diagnosis not present

## 2020-08-12 DIAGNOSIS — H353112 Nonexudative age-related macular degeneration, right eye, intermediate dry stage: Secondary | ICD-10-CM | POA: Diagnosis not present

## 2020-08-12 DIAGNOSIS — H353123 Nonexudative age-related macular degeneration, left eye, advanced atrophic without subfoveal involvement: Secondary | ICD-10-CM | POA: Diagnosis not present

## 2020-08-12 NOTE — Progress Notes (Signed)
This service is provided via telemedicine  No vital signs collected/recorded due to the encounter was a telemedicine visit.   Location of patient (ex: home, work):  Home  Patient consents to a telephone visit:  Yes, see encounter dated 08/12/2020  Location of the provider (ex: office, home): [place]  Name of any referring provider:  Veleta Miners, MD  Names of all persons participating in the telemedicine service and their role in the encounter: Sherrie Mustache, Nurse Practitioner, Carroll Kinds, CMA, and patient.   Time spent on call:  10 minutes with medical assistant

## 2020-08-12 NOTE — Progress Notes (Signed)
Subjective:   [name] is a 87 y.o. female who presents for Medicare Annual (Subsequent) preventive examination.  Review of Systems     Cardiac Risk Factors include: advanced age (>47men, >42 women);obesity (BMI >30kg/m2);hypertension     Objective:    There were no vitals filed for this visit. There is no height or weight on file to calculate BMI.  Advanced Directives 08/12/2020 06/20/2019 06/19/2019 03/16/2019 09/20/2017 09/06/2017 08/31/2017  Does Patient Have a Medical Advance Directive? Yes No No Yes Yes Yes Yes  Type of Advance Directive Out of facility DNR (pink MOST or yellow form) - - Press photographer;Living will Out of facility DNR (pink MOST or yellow form) Out of facility DNR (pink MOST or yellow form) Living will  Does patient want to make changes to medical advance directive? No - Patient declined - - No - Patient declined - - -  Copy of Press photographer in [place]  Would patient like information on creating a medical advance directive? - No - Patient declined No - Guardian declined - - - -  Pre-existing out of facility DNR order (yellow form or pink MOST form) - - - - Yellow form placed in chart (order not valid for inpatient use) Yellow form placed in chart (order not valid for inpatient use) -    Current Medications (verified) Outpatient Encounter Medications as of 08/12/2020  Medication Sig  . atorvastatin (LIPITOR) 40 MG tablet Take 1 tablet (40 mg total) by mouth daily.  Marland Kitchen ELIQUIS 5 MG TABS tablet TAKE 1 TABLET BY MOUTH TWICE A DAY  . furosemide (LASIX) 20 MG tablet Take 1 tablet (20 mg total) by mouth daily. (Patient taking differently: Take 20 mg by mouth daily. Can take esxra dose when swelling is worse)  . hydroxypropyl methylcellulose / hypromellose (ISOPTO TEARS / GONIOVISC) 2.5 % ophthalmic solution Place 1 drop into both eyes in the morning and at bedtime.  Marland Kitchen ipratropium (ATROVENT) 0.06 % nasal spray Place 2 sprays into  both nostrils 4 (four) times daily.  . metoprolol succinate (TOPROL-XL) 25 MG 24 hr tablet Take 25 mg by mouth at bedtime.  . montelukast (SINGULAIR) 10 MG tablet Take one tablet once daily at night for coughing or wheezing.  . Multiple Vitamins-Minerals (PRESERVISION AREDS 2) CAPS Take 1 capsule by mouth 2 (two) times daily.   . pantoprazole (PROTONIX) 40 MG tablet Take 1 tablet (40 mg total) by mouth daily.  . [DISCONTINUED] Polyethyl Glycol-Propyl Glycol (SYSTANE OP) Place 1 drop into both eyes 2 (two) times daily as needed (dry eyes).   No facility-administered encounter medications on file as of 08/12/2020.    Allergies (verified) Celecoxib   History: Past Medical History:  Diagnosis Date  . Allergic rhinitis, seasonal   . Breast cancer of upper-outer quadrant of right female breast ([place]) 09/13/2014  . Chronic venous insufficiency   . Dermatophytosis of nail   . Dysrhythmia    H/o Atrial fibrillation  . Edema leg   . Family history of malignant neoplasm of breast   . Family history of malignant neoplasm of ovary   . Goiter    right thyroid nodule   . HTN (hypertension) 08/13/2017  . HX: breast cancer    bilateral  . Menopausal syndrome   . Osteoarthritis   . Osteoporosis   . Squamous cell skin cancer    right lower leg  . Wears glasses   . Wears hearing  aid    both ears   Past Surgical History:  Procedure Laterality Date  . bilateral lumpectomies for breast cancer  Aug. 2007  . BREAST LUMPECTOMY WITH RADIOACTIVE SEED LOCALIZATION Right 10/01/2014   Procedure: BREAST LUMPECTOMY WITH RADIOACTIVE SEED LOCALIZATION;  Surgeon: Erroll Luna, MD;  Location: [facility];  Service: General;  Laterality: Right;  . BREAST LUMPECTOMY WITH RADIOACTIVE SEED LOCALIZATION Left 03/22/2019   Procedure: LEFT BREAST LUMPECTOMY WITH RADIOACTIVE SEED LOCALIZATION;  Surgeon: Erroll Luna, MD;  Location: [facility];  Service: General;  Laterality: Left;  . CATARACT EXTRACTION    .  CHOLECYSTECTOMY N/A 08/13/2017   Procedure: LAPAROSCOPIC CHOLECYSTECTOMY WITH INTRAOPERATIVE CHOLANGIOGRAM;  Surgeon: Excell Seltzer, MD;  Location: [facility];  Service: General;  Laterality: N/A;  . DILATION AND CURETTAGE OF UTERUS    . ESOPHAGOGASTRODUODENOSCOPY  07/26/2011   Procedure: ESOPHAGOGASTRODUODENOSCOPY (EGD);  Surgeon: Jeryl Columbia, MD;  Location: [facility];  Service: Endoscopy;  Laterality: N/A;  . history of lumbar compression fracture    . left eardum sx  1983  . left knee arthroscopic surgery    . no screening colonoscopy    . s/p bilateral lumpectomies    . SAVORY DILATION  07/26/2011   Procedure: SAVORY DILATION;  Surgeon: Jeryl Columbia, MD;  Location: [facility];  Service: Endoscopy;  Laterality: N/A;  . status post resection squamous cell cancer right lower leg    . TONSILLECTOMY    . UMBILICAL HERNIA REPAIR     Family History  Problem Relation Age of Onset  . Cancer Mother 39       breast  . Heart attack Father   . Heart attack Brother   . Heart attack Brother   . Cancer Maternal Aunt 55       breast  . Coronary artery disease Other   . Cancer Other        ovarian; daughter of mat aunt w/ BC in 47s  . Heart disease Brother   . Cancer Maternal Aunt 58       breast   Social History   Socioeconomic History  . Marital status: Married    Spouse name: Not on file  . Number of children: Not on file  . Years of education: Not on file  . Highest education level: Not on file  Occupational History  . Not on file  Tobacco Use  . Smoking status: Never Smoker  . Smokeless tobacco: Never Used  Vaping Use  . Vaping Use: Never used  Substance and Sexual Activity  . Alcohol use: No  . Drug use: No  . Sexual activity: Not on file  Other Topics Concern  . Not on file  Social History Narrative   Pt and her husband are Active members of [employer] and have support from this community. No Children.    Social Determinants of Health   Financial  Resource Strain: Not on file  Food Insecurity: Not on file  Transportation Needs: Not on file  Physical Activity: Not on file  Stress: Not on file  Social Connections: Not on file    Tobacco Counseling Counseling given: Not Answered   Clinical Intake:  Pre-visit preparation completed: Yes  Pain : No/denies pain     BMI - recorded: 30 Nutritional Status: BMI > 30  Obese Nutritional Risks: None Diabetes: No  How often do you need to have someone help you when you read instructions, pamphlets, or other written materials from your doctor or  pharmacy?: 1 - Never  Diabetic?no         Activities of Daily Living In your present state of health, do you have any difficulty performing the following activities: 08/12/2020  Hearing? Y  Vision? N  Difficulty concentrating or making decisions? N  Walking or climbing stairs? N  Dressing or bathing? N  Doing errands, shopping? Y  Comment does not Physiological scientist and eating ? N  Using the Toilet? N  In the past six months, have you accidently leaked urine? N  Do you have problems with loss of bowel control? N  Managing your Medications? Y  Managing your Finances? N  Housekeeping or managing your Housekeeping? N  Some recent data might be hidden    Patient Care Team: Virgie Dad, MD as PCP - General (Internal Medicine) Croitoru, Dani Hutchins, MD as PCP - Cardiology (Cardiology) Erroll Luna, MD as Consulting Physician (General Surgery) Magrinat, Virgie Dad, MD as Consulting Physician (Oncology) Arloa Koh, MD (Inactive) as Consulting Physician (Radiation Oncology) [name], RN as Registered Nurse [name], RN as Registered Nurse [name], NP (Inactive) as Nurse Practitioner (Nurse Practitioner) [name], MD as Consulting Physician (Gastroenterology)  Indicate any recent Medical Services you may have received from other than [facility] providers in the past year (date may be approximate).      Assessment:   This is a routine wellness examination for [place].  Hearing/Vision screen  Hearing Screening   125Hz  250Hz  500Hz  1000Hz  2000Hz  3000Hz  4000Hz  6000Hz  8000Hz   Right ear:           Left ear:           Comments: Patient wears hearing aids.  Vision Screening Comments: Patient wears glasses. Patient has no other vision problems. Patient has eye exam 08/12/2020. Patient sees Dr. Sherlynn Stalls.  Dietary issues and exercise activities discussed: Current Exercise Habits: Home exercise routine, Type of exercise: walking;calisthenics, Time (Minutes): 20, Frequency (Times/Week): 7, Weekly Exercise (Minutes/Week): 140  Goals    . Patient Stated     Maintain current level of health      Depression Screen PHQ 2/9 Scores 08/12/2020 10/06/2018 06/09/2018 08/09/2016 08/07/2015 07/18/2015 07/25/2014  PHQ - 2 Score 0 0 0 0 0 0 0    Fall Risk Fall Risk  08/12/2020 08/06/2020 04/09/2020 08/22/2019 08/02/2019  Falls in the past year? 0 0 0 0 0  Comment - - - - -  Number falls in past yr: 0 0 0 0 0  Injury with Fall? 0 - - - -    FALL RISK PREVENTION PERTAINING TO THE HOME:  Any stairs in or around the home? No  If so, are there any without handrails? No  Home free of loose throw rugs in walkways, pet beds, electrical cords, etc? Yes  Adequate lighting in your home to reduce risk of falls? Yes   ASSISTIVE DEVICES UTILIZED TO PREVENT FALLS:  Life alert? No  Use of a cane, walker or w/c? Yes  Grab bars in the bathroom? Yes  Shower chair or bench in shower? Yes  Elevated toilet seat or a handicapped toilet? Yes   TIMED UP AND GO:  Was the test performed? No .    Cognitive Function:     6CIT Screen 08/12/2020  What Year? 0 points  What month? 0 points  What time? 0 points  Count back from 20 0 points  Months in reverse 4 points  Repeat phrase 2 points  Total  Score 6    Immunizations Immunization History  Administered Date(s) Administered  . Influenza Split 04/19/2011,  05/01/2012  . Influenza Whole 04/18/2006, 05/02/2007, 04/10/2009, 04/14/2010  . Influenza, High Dose Seasonal PF 05/03/2013, 06/05/2015, 05/16/2017, 05/02/2019, 04/08/2020  . Influenza,inj,Quad PF,6+ Mos 04/20/2018  . Influenza-Unspecified 05/06/2014, 04/29/2016  . Moderna Sars-Covid-2 Vaccination 08/20/2019, 09/17/2019, 06/02/2020  . PPD Test 02/08/2011  . Pneumococcal Conjugate-13 05/06/2012  . Pneumococcal Polysaccharide-23 04/18/2005, 02/08/2011  . Td 04/18/2005  . Zoster 05/18/2012    TDAP status: Due, Education has been provided regarding the importance of this vaccine. Advised may receive this vaccine at local pharmacy or Health Dept. Aware to provide a copy of the vaccination record if obtained from local pharmacy or Health Dept. Verbalized acceptance and understanding.  Flu Vaccine status: Up to date  Pneumococcal vaccine status: Up to date  Covid-19 vaccine status: Completed vaccines  Qualifies for Shingles Vaccine? Yes   Zostavax completed Yes   Shingrix Completed?: No.    Education has been provided regarding the importance of this vaccine. Patient has been advised to call insurance company to determine out of pocket expense if they have not yet received this vaccine. Advised may also receive vaccine at local pharmacy or Health Dept. Verbalized acceptance and understanding.  Screening Tests Health Maintenance  Topic Date Due  . MAMMOGRAM  10/30/2020  . COVID-19 Vaccine (4 - Booster for Moderna series) 11/30/2020  . TETANUS/TDAP  05/04/2023  . INFLUENZA VACCINE  Completed  . DEXA SCAN  Completed  . PNA vac Low Risk Adult  Completed    Health Maintenance  There are no preventive care reminders to display for this patient.  Colorectal cancer screening: No longer required.   Mammogram status: No longer required due to age.  Bone Density status: Completed 01/2019. Results reflect: Bone density results: OSTEOPENIA. Repeat every 2 years.  Lung Cancer Screening: (Low  Dose CT Chest recommended if Age 37-80 years, 30 pack-year currently smoking OR have quit w/in 15years.) does not qualify.    Additional Screening:  Hepatitis C Screening: does not qualify  Vision Screening: Recommended annual ophthalmology exams for early detection of glaucoma and other disorders of the eye. Is the patient up to date with their annual eye exam?  Yes  Who is the provider or what is the name of the office in which the patient attends annual eye exams? Dr Baird [facility] If pt is not established with a provider, would they like to be referred to a provider to establish care? No .   Dental Screening: Recommended annual dental exams for proper oral hygiene  Community Resource Referral / Chronic Care Management: CRR required this visit?  No      Plan:     I have personally reviewed and noted the following in the patient's chart:   . Medical and social history . Use of alcohol, tobacco or illicit drugs  . Current medications and supplements . Functional ability and status . Nutritional status . Physical activity . Advanced directives . List of other physicians . Hospitalizations, surgeries, and ER visits in previous 12 months . Vitals . Screenings to include cognitive, depression, and falls . Referrals and appointments  In addition, I have reviewed and discussed with patient certain preventive protocols, quality metrics, and best practice recommendations. A written personalized care plan for preventive services as well as general preventive health recommendations were provided to patient.     Lauree Chandler, NP   08/12/2020    Virtual Visit via Telephone Note  I connected with on 08/12/20 at  2:15 PM EST by telephone and verified that I am speaking with the correct person using two identifiers.  Location: Patient: home Provider: [facility]   I discussed the limitations, risks, security and privacy concerns of performing an evaluation and management service by  telephone and the availability of in person appointments. I also discussed with the patient that there may be a patient responsible charge related to this service. The patient expressed understanding and agreed to proceed.   I discussed the assessment and treatment plan with the patient. The patient was provided an opportunity to ask questions and all were answered. The patient agreed with the plan and demonstrated an understanding of the instructions.   The patient was advised to call back or seek an in-person evaluation if the symptoms worsen or if the condition fails to improve as anticipated.  I provided 20 minutes of non-face-to-face time during this encounter.  Carlos American. Harle Battiest Avs printed and mailed

## 2020-08-12 NOTE — Patient Instructions (Signed)
[name] , Thank you for taking time to come for your Medicare Wellness Visit. I appreciate your ongoing commitment to your health goals. Please review the following plan we discussed and let me know if I can assist you in the future.   Screening recommendations/referrals: Colonoscopy- aged out Mammogram - aged out Bone Density DUE in July  Recommended yearly ophthalmology/optometry visit for glaucoma screening and checkup Recommended yearly dental visit for hygiene and checkup  Vaccinations: Influenza vaccine- up to date Pneumococcal vaccine -up to date Tdap vaccine - DUE- to get at your local pharmacy  Shingles vaccine DUE- recommend getting Shingrix at your local Pharmacy     Advanced directives: on file.   Conditions/risks identified: advanced age. Hypertension. Fall risk.   Next appointment: yearly    Preventive Care 24 Years and Older, Female Preventive care refers to lifestyle choices and visits with your health care provider that can promote health and wellness. What does preventive care include?  A yearly physical exam. This is also called an annual well check.  Dental exams once or twice a year.  Routine eye exams. Ask your health care provider how often you should have your eyes checked.  Personal lifestyle choices, including:  Daily care of your teeth and gums.  Regular physical activity.  Eating a healthy diet.  Avoiding tobacco and drug use.  Limiting alcohol use.  Practicing safe sex.  Taking low-dose aspirin every day.  Taking vitamin and mineral supplements as recommended by your health care provider. What happens during an annual well check? The services and screenings done by your health care provider during your annual well check will depend on your age, overall health, lifestyle risk factors, and family history of disease. Counseling  Your health care provider may ask you questions about your:  Alcohol use.  Tobacco use.  Drug  use.  Emotional well-being.  Home and relationship well-being.  Sexual activity.  Eating habits.  History of falls.  Memory and ability to understand (cognition).  Work and work Statistician.  Reproductive health. Screening  You may have the following tests or measurements:  Height, weight, and BMI.  Blood pressure.  Lipid and cholesterol levels. These may be checked every 5 years, or more frequently if you are over 54 years old.  Skin check.  Lung cancer screening. You may have this screening every year starting at age 59 if you have a 30-pack-year history of smoking and currently smoke or have quit within the past 15 years.  Fecal occult blood test (FOBT) of the stool. You may have this test every year starting at age 60.  Flexible sigmoidoscopy or colonoscopy. You may have a sigmoidoscopy every 5 years or a colonoscopy every 10 years starting at age 46.  Hepatitis C blood test.  Hepatitis B blood test.  Sexually transmitted disease (STD) testing.  Diabetes screening. This is done by checking your blood sugar (glucose) after you have not eaten for a while (fasting). You may have this done every 1-3 years.  Bone density scan. This is done to screen for osteoporosis. You may have this done starting at age 83.  Mammogram. This may be done every 1-2 years. Talk to your health care provider about how often you should have regular mammograms. Talk with your health care provider about your test results, treatment options, and if necessary, the need for more tests. Vaccines  Your health care provider may recommend certain vaccines, such as:  Influenza vaccine. This is recommended every year.  Tetanus, diphtheria, and acellular pertussis (Tdap, Td) vaccine. You may need a Td booster every 10 years.  Zoster vaccine. You may need this after age 44.  Pneumococcal 13-valent conjugate (PCV13) vaccine. One dose is recommended after age 55.  Pneumococcal polysaccharide  (PPSV23) vaccine. One dose is recommended after age 5. Talk to your health care provider about which screenings and vaccines you need and how often you need them. This information is not intended to replace advice given to you by your health care provider. Make sure you discuss any questions you have with your health care provider. Document Released: 08/01/2015 Document Revised: 03/24/2016 Document Reviewed: 05/06/2015 Elsevier Interactive Patient Education  2017 [place] Prevention in the Home Falls can cause injuries. They can happen to people of all ages. There are many things you can do to make your home safe and to help prevent falls. What can I do on the outside of my home?  Regularly fix the edges of walkways and driveways and fix any cracks.  Remove anything that might make you trip as you walk through a door, such as a raised step or threshold.  Trim any bushes or trees on the path to your home.  Use bright outdoor lighting.  Clear any walking paths of anything that might make someone trip, such as rocks or tools.  Regularly check to see if handrails are loose or broken. Make sure that both sides of any steps have handrails.  Any raised decks and porches should have guardrails on the edges.  Have any leaves, snow, or ice cleared regularly.  Use sand or salt on walking paths during winter.  Clean up any spills in your garage right away. This includes oil or grease spills. What can I do in the bathroom?  Use night lights.  Install grab bars by the toilet and in the tub and shower. Do not use towel bars as grab bars.  Use non-skid mats or decals in the tub or shower.  If you need to sit down in the shower, use a plastic, non-slip stool.  Keep the floor dry. Clean up any water that spills on the floor as soon as it happens.  Remove soap buildup in the tub or shower regularly.  Attach bath mats securely with double-sided non-slip rug tape.  Do not have  throw rugs and other things on the floor that can make you trip. What can I do in the bedroom?  Use night lights.  Make sure that you have a light by your bed that is easy to reach.  Do not use any sheets or blankets that are too big for your bed. They should not hang down onto the floor.  Have a firm chair that has side arms. You can use this for support while you get dressed.  Do not have throw rugs and other things on the floor that can make you trip. What can I do in the kitchen?  Clean up any spills right away.  Avoid walking on wet floors.  Keep items that you use a lot in easy-to-reach places.  If you need to reach something above you, use a strong step stool that has a grab bar.  Keep electrical cords out of the way.  Do not use floor polish or wax that makes floors slippery. If you must use wax, use non-skid floor wax.  Do not have throw rugs and other things on the floor that can make you trip. What can I do  with my stairs?  Do not leave any items on the stairs.  Make sure that there are handrails on both sides of the stairs and use them. Fix handrails that are broken or loose. Make sure that handrails are as long as the stairways.  Check any carpeting to make sure that it is firmly attached to the stairs. Fix any carpet that is loose or worn.  Avoid having throw rugs at the top or bottom of the stairs. If you do have throw rugs, attach them to the floor with carpet tape.  Make sure that you have a light switch at the top of the stairs and the bottom of the stairs. If you do not have them, ask someone to add them for you. What else can I do to help prevent falls?  Wear shoes that:  Do not have high heels.  Have rubber bottoms.  Are comfortable and fit you well.  Are closed at the toe. Do not wear sandals.  If you use a stepladder:  Make sure that it is fully opened. Do not climb a closed stepladder.  Make sure that both sides of the stepladder are  locked into place.  Ask someone to hold it for you, if possible.  Clearly mark and make sure that you can see:  Any grab bars or handrails.  First and last steps.  Where the edge of each step is.  Use tools that help you move around (mobility aids) if they are needed. These include:  Canes.  Walkers.  Scooters.  Crutches.  Turn on the lights when you go into a dark area. Replace any light bulbs as soon as they burn out.  Set up your furniture so you have a clear path. Avoid moving your furniture around.  If any of your floors are uneven, fix them.  If there are any pets around you, be aware of where they are.  Review your medicines with your doctor. Some medicines can make you feel dizzy. This can increase your chance of falling. Ask your doctor what other things that you can do to help prevent falls. This information is not intended to replace advice given to you by your health care provider. Make sure you discuss any questions you have with your health care provider. Document Released: 05/01/2009 Document Revised: 12/11/2015 Document Reviewed: 08/09/2014 Elsevier Interactive Patient Education  2017 [employer].

## 2020-08-12 NOTE — Telephone Encounter (Signed)
[name], coppa are scheduled for a virtual visit with your provider today.    Just as we do with appointments in the office, we must obtain your consent to participate.  Your consent will be active for this visit and any virtual visit you may have with one of our providers in the next 365 days.    If you have a MyChart account, I can also send a copy of this consent to you electronically.  All virtual visits are billed to your insurance company just like a traditional visit in the office.  As this is a virtual visit, video technology does not allow for your provider to perform a traditional examination.  This may limit your provider's ability to fully assess your condition.  If your provider identifies any concerns that need to be evaluated in person or the need to arrange testing such as labs, EKG, etc, we will make arrangements to do so.    Although advances in technology are sophisticated, we cannot ensure that it will always work on either your end or our end.  If the connection with a video visit is poor, we may have to switch to a telephone visit.  With either a video or telephone visit, we are not always able to ensure that we have a secure connection.   I need to obtain your verbal consent now.   Are you willing to proceed with your visit today?   [name] has provided verbal consent on 08/12/2020 for a virtual visit (video or telephone).   Carroll Kinds, [facility] 08/12/2020  2:39 PM

## 2020-08-14 DIAGNOSIS — R2681 Unsteadiness on feet: Secondary | ICD-10-CM | POA: Diagnosis not present

## 2020-08-14 DIAGNOSIS — M6281 Muscle weakness (generalized): Secondary | ICD-10-CM | POA: Diagnosis not present

## 2020-08-14 DIAGNOSIS — R2689 Other abnormalities of gait and mobility: Secondary | ICD-10-CM | POA: Diagnosis not present

## 2020-08-18 DIAGNOSIS — R2689 Other abnormalities of gait and mobility: Secondary | ICD-10-CM | POA: Diagnosis not present

## 2020-08-18 DIAGNOSIS — R2681 Unsteadiness on feet: Secondary | ICD-10-CM | POA: Diagnosis not present

## 2020-08-18 DIAGNOSIS — M6281 Muscle weakness (generalized): Secondary | ICD-10-CM | POA: Diagnosis not present

## 2020-08-20 DIAGNOSIS — R2681 Unsteadiness on feet: Secondary | ICD-10-CM | POA: Diagnosis not present

## 2020-08-20 DIAGNOSIS — M6281 Muscle weakness (generalized): Secondary | ICD-10-CM | POA: Diagnosis not present

## 2020-08-20 DIAGNOSIS — R2689 Other abnormalities of gait and mobility: Secondary | ICD-10-CM | POA: Diagnosis not present

## 2020-08-22 DIAGNOSIS — R2689 Other abnormalities of gait and mobility: Secondary | ICD-10-CM | POA: Diagnosis not present

## 2020-08-22 DIAGNOSIS — M6281 Muscle weakness (generalized): Secondary | ICD-10-CM | POA: Diagnosis not present

## 2020-08-22 DIAGNOSIS — R2681 Unsteadiness on feet: Secondary | ICD-10-CM | POA: Diagnosis not present

## 2020-08-25 DIAGNOSIS — R2689 Other abnormalities of gait and mobility: Secondary | ICD-10-CM | POA: Diagnosis not present

## 2020-08-25 DIAGNOSIS — M6281 Muscle weakness (generalized): Secondary | ICD-10-CM | POA: Diagnosis not present

## 2020-08-25 DIAGNOSIS — R2681 Unsteadiness on feet: Secondary | ICD-10-CM | POA: Diagnosis not present

## 2020-08-27 DIAGNOSIS — M6281 Muscle weakness (generalized): Secondary | ICD-10-CM | POA: Diagnosis not present

## 2020-08-27 DIAGNOSIS — R2681 Unsteadiness on feet: Secondary | ICD-10-CM | POA: Diagnosis not present

## 2020-08-27 DIAGNOSIS — R2689 Other abnormalities of gait and mobility: Secondary | ICD-10-CM | POA: Diagnosis not present

## 2020-08-29 DIAGNOSIS — M6281 Muscle weakness (generalized): Secondary | ICD-10-CM | POA: Diagnosis not present

## 2020-08-29 DIAGNOSIS — R2689 Other abnormalities of gait and mobility: Secondary | ICD-10-CM | POA: Diagnosis not present

## 2020-08-29 DIAGNOSIS — R2681 Unsteadiness on feet: Secondary | ICD-10-CM | POA: Diagnosis not present

## 2020-09-01 DIAGNOSIS — R2689 Other abnormalities of gait and mobility: Secondary | ICD-10-CM | POA: Diagnosis not present

## 2020-09-01 DIAGNOSIS — R2681 Unsteadiness on feet: Secondary | ICD-10-CM | POA: Diagnosis not present

## 2020-09-01 DIAGNOSIS — M6281 Muscle weakness (generalized): Secondary | ICD-10-CM | POA: Diagnosis not present

## 2020-09-03 DIAGNOSIS — M6281 Muscle weakness (generalized): Secondary | ICD-10-CM | POA: Diagnosis not present

## 2020-09-03 DIAGNOSIS — R2681 Unsteadiness on feet: Secondary | ICD-10-CM | POA: Diagnosis not present

## 2020-09-03 DIAGNOSIS — R2689 Other abnormalities of gait and mobility: Secondary | ICD-10-CM | POA: Diagnosis not present

## 2020-09-03 NOTE — Telephone Encounter (Signed)
Left a message for the patient to call back.  Notice received from [facility] about Eliquis assistance. The patient will need to spend 3% out of pocket.

## 2020-09-04 ENCOUNTER — Encounter: Payer: Self-pay | Admitting: Podiatry

## 2020-09-04 ENCOUNTER — Other Ambulatory Visit: Payer: Self-pay

## 2020-09-04 ENCOUNTER — Ambulatory Visit (INDEPENDENT_AMBULATORY_CARE_PROVIDER_SITE_OTHER): Payer: MEDICARE | Admitting: Podiatry

## 2020-09-04 DIAGNOSIS — Q828 Other specified congenital malformations of skin: Secondary | ICD-10-CM | POA: Diagnosis not present

## 2020-09-04 DIAGNOSIS — M79676 Pain in unspecified toe(s): Secondary | ICD-10-CM

## 2020-09-04 DIAGNOSIS — B351 Tinea unguium: Secondary | ICD-10-CM | POA: Diagnosis not present

## 2020-09-04 DIAGNOSIS — D689 Coagulation defect, unspecified: Secondary | ICD-10-CM | POA: Diagnosis not present

## 2020-09-05 DIAGNOSIS — M6281 Muscle weakness (generalized): Secondary | ICD-10-CM | POA: Diagnosis not present

## 2020-09-05 DIAGNOSIS — R2681 Unsteadiness on feet: Secondary | ICD-10-CM | POA: Diagnosis not present

## 2020-09-05 DIAGNOSIS — R2689 Other abnormalities of gait and mobility: Secondary | ICD-10-CM | POA: Diagnosis not present

## 2020-09-07 NOTE — Progress Notes (Signed)
She presents today chief complaint of painfully elongated toenails and calluses bilateral. States it is painful to walk.  Objective: Vital signs are stable she alert oriented x3 pulses are palpable. No open lesions or wounds are noted. Multiple reactive hyper keratomas plantar aspect of the forefoot bilateral. There is no erythema edema cellulitis drainage or odor.  Assessment: Toenails are long thick yellow dystrophic-like mycotic sharply incurvated painful palpation as well as debridement. Painful benign skin lesions.  Plan: Debrided toenails 1 through 5 bilateral debrided skin lesions bilateral.

## 2020-09-08 DIAGNOSIS — R2689 Other abnormalities of gait and mobility: Secondary | ICD-10-CM | POA: Diagnosis not present

## 2020-09-08 DIAGNOSIS — R2681 Unsteadiness on feet: Secondary | ICD-10-CM | POA: Diagnosis not present

## 2020-09-08 DIAGNOSIS — M6281 Muscle weakness (generalized): Secondary | ICD-10-CM | POA: Diagnosis not present

## 2020-09-10 DIAGNOSIS — R2689 Other abnormalities of gait and mobility: Secondary | ICD-10-CM | POA: Diagnosis not present

## 2020-09-10 DIAGNOSIS — R2681 Unsteadiness on feet: Secondary | ICD-10-CM | POA: Diagnosis not present

## 2020-09-10 DIAGNOSIS — M6281 Muscle weakness (generalized): Secondary | ICD-10-CM | POA: Diagnosis not present

## 2020-09-12 DIAGNOSIS — M6281 Muscle weakness (generalized): Secondary | ICD-10-CM | POA: Diagnosis not present

## 2020-09-12 DIAGNOSIS — R2689 Other abnormalities of gait and mobility: Secondary | ICD-10-CM | POA: Diagnosis not present

## 2020-09-12 DIAGNOSIS — R2681 Unsteadiness on feet: Secondary | ICD-10-CM | POA: Diagnosis not present

## 2020-09-16 DIAGNOSIS — M6281 Muscle weakness (generalized): Secondary | ICD-10-CM | POA: Diagnosis not present

## 2020-09-16 DIAGNOSIS — R2681 Unsteadiness on feet: Secondary | ICD-10-CM | POA: Diagnosis not present

## 2020-09-16 DIAGNOSIS — R2689 Other abnormalities of gait and mobility: Secondary | ICD-10-CM | POA: Diagnosis not present

## 2020-09-17 DIAGNOSIS — R2689 Other abnormalities of gait and mobility: Secondary | ICD-10-CM | POA: Diagnosis not present

## 2020-09-17 DIAGNOSIS — R2681 Unsteadiness on feet: Secondary | ICD-10-CM | POA: Diagnosis not present

## 2020-09-17 DIAGNOSIS — M6281 Muscle weakness (generalized): Secondary | ICD-10-CM | POA: Diagnosis not present

## 2020-09-19 DIAGNOSIS — R2681 Unsteadiness on feet: Secondary | ICD-10-CM | POA: Diagnosis not present

## 2020-09-19 DIAGNOSIS — R2689 Other abnormalities of gait and mobility: Secondary | ICD-10-CM | POA: Diagnosis not present

## 2020-09-19 DIAGNOSIS — M6281 Muscle weakness (generalized): Secondary | ICD-10-CM | POA: Diagnosis not present

## 2020-09-22 ENCOUNTER — Other Ambulatory Visit: Payer: Self-pay | Admitting: Cardiovascular Disease

## 2020-09-22 ENCOUNTER — Other Ambulatory Visit: Payer: Self-pay | Admitting: Nurse Practitioner

## 2020-09-22 ENCOUNTER — Other Ambulatory Visit: Payer: Self-pay

## 2020-09-22 ENCOUNTER — Telehealth: Payer: Self-pay

## 2020-09-22 MED ORDER — MONTELUKAST SODIUM 10 MG PO TABS: ORAL_TABLET | ORAL | 0 refills | Status: DC

## 2020-09-22 NOTE — Telephone Encounter (Signed)
Patient called to schedule a follow up and request a refill on her Singulair. Patient is scheduled to see Dr Neldon Mc on 10/28/2020  Please contact patient once this medication is sent in.  [id number] - [place], [place] - [place]

## 2020-09-22 NOTE — Telephone Encounter (Signed)
Sent  in a 30-day courtesy refill for the singular to [id number] - [place], [place] - [place] as requested. Called pt. to notify her it was sent left a message to call back if she has any questions and to keep her appointment.

## 2020-09-26 ENCOUNTER — Other Ambulatory Visit: Payer: Self-pay | Admitting: Cardiovascular Disease

## 2020-09-30 ENCOUNTER — Other Ambulatory Visit: Payer: Self-pay | Admitting: Internal Medicine

## 2020-09-30 MED ORDER — METOPROLOL SUCCINATE ER 25 MG PO TB24: 25.0000 mg | ORAL_TABLET | Freq: Every day | ORAL | 1 refills | Status: DC

## 2020-09-30 NOTE — Telephone Encounter (Signed)
I called and spoke with patient and she verified her dosage as 25 mg not 50 mg prescription changed and sent to pharmacy

## 2020-10-03 ENCOUNTER — Encounter: Payer: Self-pay | Admitting: Cardiovascular Disease

## 2020-10-03 ENCOUNTER — Other Ambulatory Visit: Payer: Self-pay

## 2020-10-03 ENCOUNTER — Ambulatory Visit (INDEPENDENT_AMBULATORY_CARE_PROVIDER_SITE_OTHER): Payer: MEDICARE | Admitting: Cardiovascular Disease

## 2020-10-03 VITALS — BP 112/60 | HR 69 | Ht 63.0 in | Wt 171.2 lb

## 2020-10-03 DIAGNOSIS — I25119 Atherosclerotic heart disease of native coronary artery with unspecified angina pectoris: Secondary | ICD-10-CM

## 2020-10-03 DIAGNOSIS — I480 Paroxysmal atrial fibrillation: Secondary | ICD-10-CM | POA: Diagnosis not present

## 2020-10-03 DIAGNOSIS — Z7901 Long term (current) use of anticoagulants: Secondary | ICD-10-CM | POA: Diagnosis not present

## 2020-10-03 DIAGNOSIS — E7800 Pure hypercholesterolemia, unspecified: Secondary | ICD-10-CM

## 2020-10-03 NOTE — Patient Instructions (Signed)

## 2020-10-03 NOTE — Progress Notes (Signed)
Cardiology office note   Date:  10/03/2020   ID:  [name], DOB 03/07/1922, MRN [id number]  PCP:  Virgie Dad, MD  Cardiologist:  Sanda Klein, MD  Electrophysiologist:  None   Evaluation Performed:  Follow-Up Visit  Chief Complaint: Atrial fibrillation  History of Present Illness:    [name] is a 87 y.o. female with history of paroxysmal atrial fibrillation, complicated by non-STEMI and transient congestive heart failure in the setting of cholecystectomy in January 2019.  She had COVID-19 pneumonia with hypoxemia and required brief hospitalization in late 2020, recovered well from this.  [name] is feeling quite well.  She denies any cardiovascular complaints other than mild bilateral ankle swelling, which persists even though she uses compression stockings.  The swelling is present at the end of the day but is usually gone by the next morning after lying in bed.  The patient specifically denies any chest pain at rest exertion, dyspnea at rest or with exertion, orthopnea, paroxysmal nocturnal dyspnea, syncope, palpitations, focal neurological deficits, intermittent claudication, unexplained weight gain, cough, hemoptysis or wheezing.   Past Medical History:  Diagnosis Date  . Allergic rhinitis, seasonal   . Breast cancer of upper-outer quadrant of right female breast ([place]) 09/13/2014  . Chronic venous insufficiency   . Dermatophytosis of nail   . Dysrhythmia    H/o Atrial fibrillation  . Edema leg   . Family history of malignant neoplasm of breast   . Family history of malignant neoplasm of ovary   . Goiter    right thyroid nodule   . HTN (hypertension) 08/13/2017  . HX: breast cancer    bilateral  . Menopausal syndrome   . Osteoarthritis   . Osteoporosis   . Squamous cell skin cancer    right lower leg  . Wears glasses   . Wears hearing aid    both ears   Past Surgical History:  Procedure Laterality Date  . bilateral lumpectomies for breast cancer   Aug. 2007  . BREAST LUMPECTOMY WITH RADIOACTIVE SEED LOCALIZATION Right 10/01/2014   Procedure: BREAST LUMPECTOMY WITH RADIOACTIVE SEED LOCALIZATION;  Surgeon: Erroll Luna, MD;  Location: [facility];  Service: General;  Laterality: Right;  . BREAST LUMPECTOMY WITH RADIOACTIVE SEED LOCALIZATION Left 03/22/2019   Procedure: LEFT BREAST LUMPECTOMY WITH RADIOACTIVE SEED LOCALIZATION;  Surgeon: Erroll Luna, MD;  Location: [facility];  Service: General;  Laterality: Left;  . CATARACT EXTRACTION    . CHOLECYSTECTOMY N/A 08/13/2017   Procedure: LAPAROSCOPIC CHOLECYSTECTOMY WITH INTRAOPERATIVE CHOLANGIOGRAM;  Surgeon: Excell Seltzer, MD;  Location: [facility];  Service: General;  Laterality: N/A;  . DILATION AND CURETTAGE OF UTERUS    . ESOPHAGOGASTRODUODENOSCOPY  07/26/2011   Procedure: ESOPHAGOGASTRODUODENOSCOPY (EGD);  Surgeon: Jeryl Columbia, MD;  Location: [facility];  Service: Endoscopy;  Laterality: N/A;  . history of lumbar compression fracture    . left eardum sx  1983  . left knee arthroscopic surgery    . no screening colonoscopy    . s/p bilateral lumpectomies    . SAVORY DILATION  07/26/2011   Procedure: SAVORY DILATION;  Surgeon: Jeryl Columbia, MD;  [facility];  Service: Endoscopy;  Laterality: N/A;  . status post resection squamous cell cancer right lower leg    . TONSILLECTOMY    . UMBILICAL HERNIA REPAIR       Current Meds  Medication Sig  . atorvastatin (LIPITOR) 40 MG tablet Take 1 tablet (40 mg total) by mouth  daily.  Marland Kitchen ELIQUIS 5 MG TABS tablet TAKE 1 TABLET BY MOUTH TWICE A DAY  . furosemide (LASIX) 20 MG tablet Take 1 tablet (20 mg total) by mouth daily. (Patient taking differently: Take 20 mg by mouth daily. Can take esxra dose when swelling is worse)  . hydroxypropyl methylcellulose / hypromellose (ISOPTO TEARS / GONIOVISC) 2.5 % ophthalmic solution Place 1 drop into both eyes in the morning and at bedtime.  Marland Kitchen ipratropium (ATROVENT) 0.06 % nasal spray  Place 2 sprays into both nostrils 4 (four) times daily.  . metoprolol succinate (TOPROL-XL) 25 MG 24 hr tablet Take 1 tablet (25 mg total) by mouth at bedtime.  . montelukast (SINGULAIR) 10 MG tablet Take one tablet once daily at night for coughing or wheezing.  . Multiple Vitamins-Minerals (PRESERVISION AREDS 2) CAPS Take 1 capsule by mouth 2 (two) times daily.   . pantoprazole (PROTONIX) 40 MG tablet Take 1 tablet (40 mg total) by mouth daily.     Allergies:   Celecoxib   Social History   Tobacco Use  . Smoking status: Never Smoker  . Smokeless tobacco: Never Used  Vaping Use  . Vaping Use: Never used  Substance Use Topics  . Alcohol use: No  . Drug use: No     Family Hx: The patient's family history includes Cancer in an other family member; Cancer (age of onset: 76) in her maternal aunt; Cancer (age of onset: 99) in her maternal aunt; Cancer (age of onset: 53) in her mother; Coronary artery disease in an other family member; Heart attack in her brother, brother, and father; Heart disease in her brother.  ROS:   Please see the history of present illness.   All other systems are reviewed and are negative.   Prior CV studies:   The following studies were reviewed today:  Echo from 08/26/2017 shows LVEF 50-55%, pseudonormal mitral inflow, moderate mitral insufficiency and moderate tricuspid insufficiency, mild to moderately dilated left atrium  Duplex venous ultrasound 05/11/2019 was negative for thrombus  Labs/Other Tests and Data Reviewed:    EKG: Ordered today and personally reviewed.  It shows normal sinus rhythm, QS pattern in leads V1-V2, but no acute ischemic repolarization changes.  QTc 437 ms.  Recent Labs: 11/05/2019: TSH 5.39 04/02/2020: ALT 15; BUN 28; Creat 1.02; Hemoglobin 12.2; Platelets 177; Potassium 4.1; Sodium 144   Recent Lipid Panel Lab Results  Component Value Date/Time   CHOL 146 07/30/2019 08:53 AM   CHOL 109 09/19/2017 12:00 AM   TRIG 110  07/30/2019 08:53 AM   TRIG 107 09/19/2017 12:00 AM   HDL 49 (L) 07/30/2019 08:53 AM   CHOLHDL 3.0 07/30/2019 08:53 AM   LDLCALC 77 07/30/2019 08:53 AM   LDLCALC 59 09/19/2017 12:00 AM   LDLDIRECT 173.9 06/25/2013 09:48 AM    Wt Readings from Last 3 Encounters:  10/03/20 171 lb 3.2 oz (77.7 kg)  08/06/20 172 lb 1.6 oz (78.1 kg)  04/09/20 163 lb 6.4 oz (74.1 kg)     Objective:    Vital Signs:  BP 112/60   Pulse 69   Ht 5\' 3"  (1.6 m)   Wt 171 lb 3.2 oz (77.7 kg)   SpO2 99%   BMI 30.33 kg/m    General: Alert, oriented x3, no distress, appears well.  Borderline obese. Head: no evidence of trauma, PERRL, EOMI, no exophtalmos or lid lag, no myxedema, no xanthelasma; normal ears, nose and oropharynx Neck: normal jugular venous pulsations and no hepatojugular reflux; brisk  carotid pulses without delay and no carotid bruits Chest: clear to auscultation, no signs of consolidation by percussion or palpation, normal fremitus, symmetrical and full respiratory excursions Cardiovascular: normal position and quality of the apical impulse, regular rhythm, normal first and second heart sounds, no murmurs, rubs or gallops Abdomen: no tenderness or distention, no masses by palpation, no abnormal pulsatility or arterial bruits, normal bowel sounds, no hepatosplenomegaly Extremities: no clubbing, cyanosis or edema; 2+ radial, ulnar and brachial pulses bilaterally; 2+ right femoral, posterior tibial and dorsalis pedis pulses; 2+ left femoral, posterior tibial and dorsalis pedis pulses; no subclavian or femoral bruits Neurological: grossly nonfocal Psych: Normal mood and affect  ASSESSMENT & PLAN:    1. AFib: No clinically evident events.  CHA2DS2-VASc score 4-6 (age 52, gender, +/-hx of resolved CHF, +/-suspected CAD, hypertension). 2. Anticoagulation: Tolerated, no bleeding complications. 3. HLP: On statin.  Most recent LDL cholesterol was 77.  I believe this is acceptable in a 87 year old with  questionable CAD. 4. CAD: Non-STEMI and transient congestive heart failure in the setting of acute cholecystitis/cholecystectomy, without subsequent problems with angina pectoris.  Suspect underlying CAD, but conservative management preferred in view of age and preserved left ventricular systolic function.   Patient Instructions  Medication Instructions:  No changes *If you need a refill on your cardiac medications before your next appointment, please call your pharmacy*   Lab Work: None ordered If you have labs (blood work) drawn today and your tests are completely normal, you will receive your results only by: Marland Kitchen MyChart Message (if you have MyChart) OR . A paper copy in the mail If you have any lab test that is abnormal or we need to change your treatment, we will call you to review the results.   Testing/Procedures: None ordered   Follow-Up: At [facility], you and your health needs are our priority.  As part of our continuing mission to provide you with exceptional heart care, we have created designated Provider Care Teams.  These Care Teams include your primary Cardiologist (physician) and Advanced Practice Providers (APPs -  Physician Assistants and Nurse Practitioners) who all work together to provide you with the care you need, when you need it.  We recommend signing up for the patient portal called "MyChart".  Sign up information is provided on this After Visit Summary.  MyChart is used to connect with patients for Virtual Visits (Telemedicine).  Patients are able to view lab/test results, encounter notes, upcoming appointments, etc.  Non-urgent messages can be sent to your provider as well.   To learn more about what you can do with MyChart, go to [email or web address].    Your next appointment:   12 month(s)  The format for your next appointment:   In Person  Provider:   You may see Sanda Klein, MD or one of the following Advanced Practice Providers on your  designated Care Team:    Almyra Deforest, PA-C  Fabian Sharp, [place] or   Roby Lofts, PA-C     Signed, Sanda Klein, MD  10/03/2020 10:44 AM    [place]

## 2020-10-06 ENCOUNTER — Other Ambulatory Visit: Payer: Self-pay | Admitting: *Deleted

## 2020-10-06 MED ORDER — ATORVASTATIN CALCIUM 40 MG PO TABS: 40.0000 mg | ORAL_TABLET | Freq: Every day | ORAL | 1 refills | Status: DC

## 2020-10-06 NOTE — Telephone Encounter (Signed)
Patient husband requested refill.  

## 2020-10-08 ENCOUNTER — Encounter: Payer: Self-pay | Admitting: Cardiovascular Disease

## 2020-10-28 ENCOUNTER — Ambulatory Visit (INDEPENDENT_AMBULATORY_CARE_PROVIDER_SITE_OTHER): Payer: MEDICARE | Admitting: Allergy and Immunology

## 2020-10-28 ENCOUNTER — Other Ambulatory Visit: Payer: Self-pay

## 2020-10-28 ENCOUNTER — Encounter: Payer: Self-pay | Admitting: Allergy and Immunology

## 2020-10-28 VITALS — BP 150/58 | HR 70 | Temp 97.7°F | Resp 20

## 2020-10-28 DIAGNOSIS — I25119 Atherosclerotic heart disease of native coronary artery with unspecified angina pectoris: Secondary | ICD-10-CM

## 2020-10-28 DIAGNOSIS — J3089 Other allergic rhinitis: Secondary | ICD-10-CM

## 2020-10-28 MED ORDER — FUROSEMIDE 20 MG PO TABS: 20.0000 mg | ORAL_TABLET | Freq: Every day | ORAL | 1 refills | Status: DC

## 2020-10-28 NOTE — Progress Notes (Signed)
[place]   Follow-up Note  Referring Provider: Virgie Dad, MD Primary Provider: Virgie Dad, MD Date of Office Visit: 10/28/2020  Subjective:   [name] (DOB: [birth year]) is a 87 y.o. female who returns to the [facility] [place] on 10/28/2020 in re-evaluation of the following:  HPI: Legacy returns to this clinic in evaluation of rhinitis.  I have not seen her in this clinic since 06 June 2018.  She was seen by her nurse practitioner on 07 October 2019.  Overall she feels as though her plan of using nasal ipratropium and montelukast is working pretty well at this point in time regarding her drainage and her slight cough and her rhinorrhea.  She has not really had any other significant respiratory tract symptoms develop while utilizing this plan.  She has obtained 3 COVID vaccinations.  Allergies as of 10/28/2020      Reactions   Celecoxib Swelling   Swelling-leg      Medication List      atorvastatin 40 MG tablet Commonly known as: LIPITOR Take 1 tablet (40 mg total) by mouth daily.   Eliquis 5 MG Tabs tablet Generic drug: apixaban TAKE 1 TABLET BY MOUTH TWICE A DAY   furosemide 20 MG tablet Commonly known as: LASIX Take 1 tablet (20 mg total) by mouth daily. What changed: additional instructions   hydroxypropyl methylcellulose / hypromellose 2.5 % ophthalmic solution Commonly known as: ISOPTO TEARS / GONIOVISC Place 1 drop into both eyes in the morning and at bedtime.   ipratropium 0.06 % nasal spray Commonly known as: ATROVENT Place 2 sprays into both nostrils 4 (four) times daily.   metoprolol succinate 25 MG 24 hr tablet Commonly known as: TOPROL-XL Take 1 tablet (25 mg total) by mouth at bedtime.   montelukast 10 MG tablet Commonly known as: SINGULAIR Take one tablet once daily at night for coughing or wheezing.   pantoprazole 40 MG tablet Commonly known as: PROTONIX Take 1 tablet  (40 mg total) by mouth daily.   PreserVision AREDS 2 Caps Take 1 capsule by mouth 2 (two) times daily.       Past Medical History:  Diagnosis Date  . Allergic rhinitis, seasonal   . Breast cancer of upper-outer quadrant of right female breast ([place]) 09/13/2014  . Chronic venous insufficiency   . Dermatophytosis of nail   . Dysrhythmia    H/o Atrial fibrillation  . Edema leg   . Family history of malignant neoplasm of breast   . Family history of malignant neoplasm of ovary   . Goiter    right thyroid nodule   . HTN (hypertension) 08/13/2017  . HX: breast cancer    bilateral  . Menopausal syndrome   . Osteoarthritis   . Osteoporosis   . Squamous cell skin cancer    right lower leg  . Wears glasses   . Wears hearing aid    both ears    Past Surgical History:  Procedure Laterality Date  . bilateral lumpectomies for breast cancer  Aug. 2007  . BREAST LUMPECTOMY WITH RADIOACTIVE SEED LOCALIZATION Right 10/01/2014   Procedure: BREAST LUMPECTOMY WITH RADIOACTIVE SEED LOCALIZATION;  Surgeon: Erroll Luna, MD;  Location: [facility];  Service: General;  Laterality: Right;  . BREAST LUMPECTOMY WITH RADIOACTIVE SEED LOCALIZATION Left 03/22/2019   Procedure: LEFT BREAST LUMPECTOMY WITH RADIOACTIVE SEED LOCALIZATION;  Surgeon: Erroll Luna, MD;  Location: [facility];  Service:  General;  Laterality: Left;  . CATARACT EXTRACTION    . CHOLECYSTECTOMY N/A 08/13/2017   Procedure: LAPAROSCOPIC CHOLECYSTECTOMY WITH INTRAOPERATIVE CHOLANGIOGRAM;  Surgeon: Excell Seltzer, MD;  Location: [facility];  Service: General;  Laterality: N/A;  . DILATION AND CURETTAGE OF UTERUS    . ESOPHAGOGASTRODUODENOSCOPY  07/26/2011   Procedure: ESOPHAGOGASTRODUODENOSCOPY (EGD);  Surgeon: Jeryl Columbia, MD;  Location: [facility];  Service: Endoscopy;  Laterality: N/A;  . history of lumbar compression fracture    . left eardum sx  1983  . left knee arthroscopic surgery    . no screening colonoscopy     . s/p bilateral lumpectomies    . SAVORY DILATION  07/26/2011   Procedure: SAVORY DILATION;  Surgeon: Jeryl Columbia, MD;  [facility];  Service: Endoscopy;  Laterality: N/A;  . status post resection squamous cell cancer right lower leg    . TONSILLECTOMY    . UMBILICAL HERNIA REPAIR      Review of systems negative except as noted in HPI / PMHx or noted below:  Review of Systems  Constitutional: Negative.   HENT: Negative.   Eyes: Negative.   Respiratory: Negative.   Cardiovascular: Negative.   Gastrointestinal: Negative.   Genitourinary: Negative.   Musculoskeletal: Negative.   Skin: Negative.   Neurological: Negative.   Endo/Heme/Allergies: Negative.   Psychiatric/Behavioral: Negative.      Objective:   Vitals:   10/28/20 1512  BP: (!) 150/58  Pulse: 70  Resp: 20  Temp: 97.7 F (36.5 C)  SpO2: 97%          Physical Exam Constitutional:      Appearance: She is not diaphoretic.  HENT:     Head: Normocephalic.     Right Ear: Ear canal and external ear normal.     Left Ear: Ear canal and external ear normal.     Ears:     Comments: Bilateral hearing aids    Nose: Nose normal. No mucosal edema or rhinorrhea.     Mouth/Throat:     Pharynx: Uvula midline. No oropharyngeal exudate.  Eyes:     Conjunctiva/sclera: Conjunctivae normal.  Neck:     Thyroid: No thyromegaly.     Trachea: Trachea normal. No tracheal tenderness or tracheal deviation.  Cardiovascular:     Rate and Rhythm: Normal rate and regular rhythm.     Heart sounds: Normal heart sounds, S1 normal and S2 normal. No murmur heard.   Pulmonary:     Effort: No respiratory distress.     Breath sounds: Normal breath sounds. No stridor. No wheezing or rales.  Lymphadenopathy:     Head:     Right side of head: No tonsillar adenopathy.     Left side of head: No tonsillar adenopathy.     Cervical: No cervical adenopathy.  Skin:    Findings: No erythema or rash.     Nails: There is no  clubbing.  Neurological:     Mental Status: She is alert.     Diagnostics:   Assessment and Plan:   1. Perennial allergic rhinitis     1.  Continue montelukast 10 mg tablet 1 time per day  2. If needed:              A. OTC Antihistamine - Claritin / Allegra / Zyrtec             B.  Ipratropium 0.06% 2 sprays each nostril every 6 hours to dry up nose  4.  Return to clinic in 12 months or earlier if problem  I think Hagar is doing okay on her current therapy and she is welcome to use montelukast on a consistent basis and nasal ipratropium and antihistamines.  I am not going to give her any additional therapy at this point for some of her lingering symptoms as she is very satisfied with the response she has receiving on this plan.  I will see her back in his clinic in 1 year or earlier if there is a problem.  Allena Katz, MD Allergy / Immunology [place]

## 2020-10-28 NOTE — Patient Instructions (Addendum)
    1.  Continue montelukast 10 mg tablet 1 time per day  2. If needed:              A. OTC Antihistamine - Claritin / Allegra / Zyrtec             B.  Ipratropium 0.06% 2 sprays each nostril every 6 hours to dry up nose  4.  Return to clinic in 12 months or earlier if problem

## 2020-10-28 NOTE — Telephone Encounter (Signed)
Patient husband called and states she needs refill on furosemide. Medication pend and sent to PCP Virgie Dad, MD due to allergy contraindication. Please Advise.

## 2020-10-29 ENCOUNTER — Encounter: Payer: Self-pay | Admitting: Allergy and Immunology

## 2020-12-09 ENCOUNTER — Ambulatory Visit: Payer: MEDICARE | Admitting: Podiatry

## 2020-12-19 ENCOUNTER — Other Ambulatory Visit: Payer: Self-pay | Admitting: Family Medicine

## 2020-12-23 ENCOUNTER — Other Ambulatory Visit: Payer: Self-pay | Admitting: Internal Medicine

## 2020-12-25 ENCOUNTER — Other Ambulatory Visit: Payer: Self-pay

## 2020-12-25 ENCOUNTER — Ambulatory Visit (INDEPENDENT_AMBULATORY_CARE_PROVIDER_SITE_OTHER): Payer: MEDICARE | Admitting: Podiatry

## 2020-12-25 DIAGNOSIS — M79676 Pain in unspecified toe(s): Secondary | ICD-10-CM

## 2020-12-25 DIAGNOSIS — D2371 Other benign neoplasm of skin of right lower limb, including hip: Secondary | ICD-10-CM | POA: Diagnosis not present

## 2020-12-25 DIAGNOSIS — D2372 Other benign neoplasm of skin of left lower limb, including hip: Secondary | ICD-10-CM | POA: Diagnosis not present

## 2020-12-25 DIAGNOSIS — D689 Coagulation defect, unspecified: Secondary | ICD-10-CM | POA: Diagnosis not present

## 2020-12-25 DIAGNOSIS — B351 Tinea unguium: Secondary | ICD-10-CM

## 2020-12-28 NOTE — Progress Notes (Signed)
She presents today chief complaint of painful toenails and calluses bilaterally.  Objective: Vital signs are stable alert oriented x3.  Pulses remain palpable.  No open lesions or wounds are noted.  Toenails are long thick yellow dystrophic-like mycotic painful palpation as well as debridement.  Several benign skin lesions to the plantar aspect of the metatarsal heads bilaterally.  Noncomplicated.  Assessment: Pain in limb secondary to onychomycosis benign skin lesions.  Plan: Debridement of all benign skin lesions debridement of nails 1 through 5 bilateral.  Follow-up with me in 3 months

## 2021-01-01 ENCOUNTER — Other Ambulatory Visit: Payer: Self-pay

## 2021-01-01 DIAGNOSIS — I5032 Chronic diastolic (congestive) heart failure: Secondary | ICD-10-CM | POA: Diagnosis not present

## 2021-01-01 DIAGNOSIS — R7303 Prediabetes: Secondary | ICD-10-CM | POA: Diagnosis not present

## 2021-01-01 DIAGNOSIS — R600 Localized edema: Secondary | ICD-10-CM

## 2021-01-01 DIAGNOSIS — I4891 Unspecified atrial fibrillation: Secondary | ICD-10-CM | POA: Diagnosis not present

## 2021-01-01 DIAGNOSIS — E782 Mixed hyperlipidemia: Secondary | ICD-10-CM

## 2021-01-02 LAB — COMPLETE METABOLIC PANEL WITH GFR
AG Ratio: 1.6 (calc) (ref 1.0–2.5)
ALT: 15 U/L (ref 6–29)
AST: 16 U/L (ref 10–35)
Albumin: 3.7 g/dL (ref 3.6–5.1)
Alkaline phosphatase (APISO): 101 U/L (ref 37–153)
BUN/Creatinine Ratio: 28 (calc) — ABNORMAL HIGH (ref 6–22)
BUN: 28 mg/dL — ABNORMAL HIGH (ref 7–25)
CO2: 27 mmol/L (ref 20–32)
Calcium: 9.1 mg/dL (ref 8.6–10.4)
Chloride: 107 mmol/L (ref 98–110)
Creat: 1 mg/dL — ABNORMAL HIGH (ref 0.60–0.88)
GFR, Est African American: 55 mL/min/{1.73_m2} — ABNORMAL LOW (ref >=60)
GFR, Est Non African American: 47 mL/min/{1.73_m2} — ABNORMAL LOW (ref >=60)
Globulin: 2.3 g/dL (calc) (ref 1.9–3.7)
Glucose, Bld: 102 mg/dL — ABNORMAL HIGH (ref 65–99)
Potassium: 3.8 mmol/L (ref 3.5–5.3)
Sodium: 143 mmol/L (ref 135–146)
Total Bilirubin: 0.5 mg/dL (ref 0.2–1.2)
Total Protein: 6 g/dL — ABNORMAL LOW (ref 6.1–8.1)

## 2021-01-02 LAB — CBC WITH DIFFERENTIAL/PLATELET
Absolute Monocytes: 427 cells/uL (ref 200–950)
Basophils Absolute: 38 cells/uL (ref 0–200)
Basophils Relative: 0.7 %
Eosinophils Absolute: 81 cells/uL (ref 15–500)
Eosinophils Relative: 1.5 %
HCT: 35.3 % (ref 35.0–45.0)
Hemoglobin: 11.6 g/dL — ABNORMAL LOW (ref 11.7–15.5)
Lymphs Abs: 1091 cells/uL (ref 850–3900)
MCH: 30.7 pg (ref 27.0–33.0)
MCHC: 32.9 g/dL (ref 32.0–36.0)
MCV: 93.4 fL (ref 80.0–100.0)
MPV: 10.8 fL (ref 7.5–12.5)
Monocytes Relative: 7.9 %
Neutro Abs: 3764 cells/uL (ref 1500–7800)
Neutrophils Relative %: 69.7 %
Platelets: 184 10*3/uL (ref 140–400)
RBC: 3.78 10*6/uL — ABNORMAL LOW (ref 3.80–5.10)
RDW: 12.8 % (ref 11.0–15.0)
Total Lymphocyte: 20.2 %
WBC: 5.4 10*3/uL (ref 3.8–10.8)

## 2021-01-02 LAB — HEMOGLOBIN A1C
Hgb A1c MFr Bld: 5.8 % of total Hgb — ABNORMAL HIGH (ref <5.7)
Mean Plasma Glucose: 120 mg/dL
eAG (mmol/L): 6.6 mmol/L

## 2021-01-02 LAB — LIPID PANEL
Cholesterol: 145 mg/dL (ref <200)
HDL: 50 mg/dL (ref >=50)
LDL Cholesterol (Calc): 75 mg/dL (calc)
Non-HDL Cholesterol (Calc): 95 mg/dL (calc) (ref <130)
Total CHOL/HDL Ratio: 2.9 (calc) (ref <5.0)
Triglycerides: 114 mg/dL (ref <150)

## 2021-01-02 LAB — TSH: TSH: 3.87 mIU/L (ref 0.40–4.50)

## 2021-01-07 ENCOUNTER — Non-Acute Institutional Stay: Payer: MEDICARE | Admitting: Internal Medicine

## 2021-01-07 ENCOUNTER — Other Ambulatory Visit: Payer: Self-pay

## 2021-01-07 ENCOUNTER — Encounter: Payer: Self-pay | Admitting: Internal Medicine

## 2021-01-07 VITALS — BP 136/74 | HR 70 | Temp 97.1°F | Ht 63.0 in | Wt 169.6 lb

## 2021-01-07 DIAGNOSIS — E782 Mixed hyperlipidemia: Secondary | ICD-10-CM | POA: Diagnosis not present

## 2021-01-07 DIAGNOSIS — J3089 Other allergic rhinitis: Secondary | ICD-10-CM | POA: Diagnosis not present

## 2021-01-07 DIAGNOSIS — R7303 Prediabetes: Secondary | ICD-10-CM

## 2021-01-07 DIAGNOSIS — K219 Gastro-esophageal reflux disease without esophagitis: Secondary | ICD-10-CM | POA: Diagnosis not present

## 2021-01-07 DIAGNOSIS — H353132 Nonexudative age-related macular degeneration, bilateral, intermediate dry stage: Secondary | ICD-10-CM | POA: Diagnosis not present

## 2021-01-07 DIAGNOSIS — I5032 Chronic diastolic (congestive) heart failure: Secondary | ICD-10-CM | POA: Diagnosis not present

## 2021-01-07 DIAGNOSIS — I10 Essential (primary) hypertension: Secondary | ICD-10-CM | POA: Diagnosis not present

## 2021-01-07 DIAGNOSIS — R600 Localized edema: Secondary | ICD-10-CM

## 2021-01-07 DIAGNOSIS — B351 Tinea unguium: Secondary | ICD-10-CM | POA: Diagnosis not present

## 2021-01-07 DIAGNOSIS — I4891 Unspecified atrial fibrillation: Secondary | ICD-10-CM

## 2021-01-07 MED ORDER — FUROSEMIDE 40 MG PO TABS: 40.0000 mg | ORAL_TABLET | Freq: Every day | ORAL | 3 refills | Status: DC

## 2021-01-07 MED ORDER — POTASSIUM CHLORIDE CRYS ER 20 MEQ PO TBCR: 20.0000 meq | EXTENDED_RELEASE_TABLET | Freq: Every day | ORAL | 3 refills | Status: DC

## 2021-01-07 NOTE — Patient Instructions (Addendum)
Changing Your Lasix to 40 mg Every day Potassium 20 MEq Every day Lab work in 2 weeks

## 2021-01-07 NOTE — Progress Notes (Signed)
Location:  [facility] of Service:  Clinic (12)  Provider:   Code Status: DNR Goals of Care:  Advanced Directives 08/12/2020  Does Patient Have a Medical Advance Directive? Yes  Type of Advance Directive Out of facility DNR (pink MOST or yellow form)  Does patient want to make changes to medical advance directive? No - Patient declined  Copy of [place] in Chart? -  Would patient like information on creating a medical advance directive? -  Pre-existing out of facility DNR order (yellow form or pink MOST form) -     Chief Complaint  Patient presents with   Medical Management of Chronic Issues    Patient returns to the clinic for follow up.     HPI: Patient is a 87 y.o. female seen today for medical management of chronic diseases.    Patient has a history of breast cancer s/p lumpectomy,No Further work up  A. fib on chronic Eliquis, coronary artery disease, hypertension, hyperlipidemia, LE edema H/O  Covid infection  Lives with her husband who is turning 89 this year Walks with her walker Her only complain stays LE edema No SOB or PND or DOE No Chest pain No Cough Cognitively doing well  Past Medical History:  Diagnosis Date   Allergic rhinitis, seasonal    Breast cancer of upper-outer quadrant of right female breast ([place]) 09/13/2014   Chronic venous insufficiency    Dermatophytosis of nail    Dysrhythmia    H/o Atrial fibrillation   Edema leg    Family history of malignant neoplasm of breast    Family history of malignant neoplasm of ovary    Goiter    right thyroid nodule    HTN (hypertension) 08/13/2017   HX: breast cancer    bilateral   Menopausal syndrome    Osteoarthritis    Osteoporosis    Squamous cell skin cancer    right lower leg   Wears glasses    Wears hearing aid    both ears    Past Surgical History:  Procedure Laterality Date   bilateral lumpectomies for breast cancer  Aug. 2007   BREAST LUMPECTOMY  WITH RADIOACTIVE SEED LOCALIZATION Right 10/01/2014   Procedure: BREAST LUMPECTOMY WITH RADIOACTIVE SEED LOCALIZATION;  Surgeon: Erroll Luna, MD;  Location: [facility];  Service: General;  Laterality: Right;   BREAST LUMPECTOMY WITH RADIOACTIVE SEED LOCALIZATION Left 03/22/2019   Procedure: LEFT BREAST LUMPECTOMY WITH RADIOACTIVE SEED LOCALIZATION;  Surgeon: Erroll Luna, MD;  Location: [facility];  Service: General;  Laterality: Left;   CATARACT EXTRACTION     CHOLECYSTECTOMY N/A 08/13/2017   Procedure: LAPAROSCOPIC CHOLECYSTECTOMY WITH INTRAOPERATIVE CHOLANGIOGRAM;  Surgeon: Excell Seltzer, MD;  Location: [facility];  Service: General;  Laterality: N/A;   DILATION AND CURETTAGE OF UTERUS     ESOPHAGOGASTRODUODENOSCOPY  07/26/2011   Procedure: ESOPHAGOGASTRODUODENOSCOPY (EGD);  Surgeon: Jeryl Columbia, MD;  Location: [facility];  Service: Endoscopy;  Laterality: N/A;   history of lumbar compression fracture     left eardum sx  1983   left knee arthroscopic surgery     no screening colonoscopy     s/p bilateral lumpectomies     SAVORY DILATION  07/26/2011   Procedure: SAVORY DILATION;  Surgeon: Jeryl Columbia, MD;  Location: [facility];  Service: Endoscopy;  Laterality: N/A;   status post resection squamous cell cancer right lower leg     TONSILLECTOMY     UMBILICAL HERNIA  REPAIR      Allergies  Allergen Reactions   Celecoxib Swelling    Swelling-leg    Outpatient Encounter Medications as of 01/07/2021  Medication Sig   atorvastatin (LIPITOR) 40 MG tablet Take 1 tablet (40 mg total) by mouth daily.   ELIQUIS 5 MG TABS tablet TAKE 1 TABLET BY MOUTH TWICE A DAY   furosemide (LASIX) 40 MG tablet Take 1 tablet (40 mg total) by mouth daily.   hydroxypropyl methylcellulose / hypromellose (ISOPTO TEARS / GONIOVISC) 2.5 % ophthalmic solution Place 1 drop into both eyes in the morning and at bedtime.   ipratropium (ATROVENT) 0.06 % nasal spray Place 2 sprays into both nostrils 4  (four) times daily.   metoprolol succinate (TOPROL-XL) 25 MG 24 hr tablet Take 1 tablet (25 mg total) by mouth at bedtime.   montelukast (SINGULAIR) 10 MG tablet TAKE ONE TABLET ONCE DAILY AT NIGHT FOR COUGHING OR WHEEZING.   Multiple Vitamins-Minerals (PRESERVISION AREDS 2) CAPS Take 1 capsule by mouth 2 (two) times daily.    pantoprazole (PROTONIX) 40 MG tablet TAKE 1 TABLET BY MOUTH EVERY DAY   potassium chloride SA (KLOR-CON) 20 MEQ tablet Take 1 tablet (20 mEq total) by mouth daily.   [DISCONTINUED] furosemide (LASIX) 20 MG tablet Take 1 tablet (20 mg total) by mouth daily. Can take esxra dose when swelling is worse   No facility-administered encounter medications on file as of 01/07/2021.    Review of Systems:  Review of Systems  Constitutional:  Positive for activity change.  HENT: Negative.    Respiratory:  Negative for shortness of breath.   Cardiovascular:  Positive for leg swelling.  Gastrointestinal:  Positive for constipation.  Genitourinary:  Positive for frequency.  Musculoskeletal: Negative.   Skin: Negative.   Neurological:  Negative for dizziness.   Health Maintenance  Topic Date Due   Zoster Vaccines- Shingrix (1 of 2) Never done   INFLUENZA VACCINE  02/16/2021   COVID-19 Vaccine (5 - Booster for Moderna series) 04/25/2021   TETANUS/TDAP  05/04/2023   DEXA SCAN  Completed   PNA vac Low Risk Adult  Completed   HPV VACCINES  Aged Out   MAMMOGRAM  Discontinued    Physical Exam: Vitals:   01/07/21 1405  BP: 136/74  Pulse: 70  Temp: (!) 97.1 F (36.2 C)  SpO2: 96%  Weight: 169 lb 9.6 oz (76.9 kg)  Height: 5\' 3"  (1.6 m)   Body mass index is 30.04 kg/m. Physical Exam Constitutional: Oriented to person, place, and time. Well-developed and well-nourished.  HENT:  Head: Normocephalic.  Mouth/Throat: Oropharynx is clear and moist.  Eyes: Pupils are equal, round, and reactive to light.  Neck: Neck supple.  Cardiovascular: Normal rate and normal heart  sounds.  No murmur heard. Pulmonary/Chest: Effort normal and breath sounds normal. No respiratory distress. No wheezes. She has no rales.  Abdominal: Soft. Bowel sounds are normal. No distension. There is no tenderness. There is no rebound.  Musculoskeletal: Bilateral Edema Lymphadenopathy: none Neurological: Alert and oriented to person, place, and time. NO focal Deficits Skin: Skin is warm and dry.  Psychiatric: Normal mood and affect. Behavior is normal. Thought content normal.   Labs reviewed: Basic Metabolic Panel: Recent Labs    04/02/20 0950 01/01/21 0800  NA 144 143  K 4.1 3.8  CL 109 107  CO2 25 27  GLUCOSE 116* 102*  BUN 28* 28*  CREATININE 1.02* 1.00*  CALCIUM 8.5* 9.1  TSH  --  3.87  Liver Function Tests: Recent Labs    04/02/20 0950 01/01/21 0800  AST 15 16  ALT 15 15  BILITOT 0.5 0.5  PROT 5.8* 6.0*   No results for input(s): LIPASE, AMYLASE in the last 8760 hours. No results for input(s): AMMONIA in the last 8760 hours. CBC: Recent Labs    04/02/20 0950 01/01/21 0800  WBC 5.1 5.4  NEUTROABS 3,361 3,764  HGB 12.2 11.6*  HCT 36.4 35.3  MCV 91.9 93.4  PLT 177 184   Lipid Panel: Recent Labs    01/01/21 0800  CHOL 145  HDL 50  LDLCALC 75  TRIG 114  CHOLHDL 2.9   Lab Results  Component Value Date   HGBA1C 5.8 (H) 01/01/2021    Procedures since last visit: No results found.  Assessment/Plan 1. Bilateral leg edema Change Lasix to 40 mg QD Also start on Potassium 20 meq QD Repeat BMP in 2 weeks  2. Chronic diastolic heart failure (HCC) Doing well Will incrase Lasix to help with her Edema  3. Atrial fibrillation with RVR (HCC) On Toprol and Eliquis  4. Mixed hyperlipidemia LDL good range on Statin  5. Prediabetes A1C is good Gets Eye and Foot exam  6. Essential hypertension Stable on Torpol  7. Perennial allergic rhinitis On Singulair and Atrovent  8. Onychomycosis Follows with Podiatrist  9. Intermediate stage  nonexudative age-related macular degeneration of both eyes Follows with Opthalmologist 10 GERD Told to change Protonix to QOD /PRN 11 H/o Breast Cancer Mammogram in few months  Follows with Dr Jana Hakim Per his notes no Adjuvant therapy Just [facility] Labs/tests ordered:  * No order type specified * Next appt:  01/22/2021

## 2021-01-13 ENCOUNTER — Telehealth: Payer: Self-pay | Admitting: *Deleted

## 2021-01-13 NOTE — Telephone Encounter (Signed)
Jessica with [facility] called and stated that patient is having a lot of TMJ issues and on quite a few medications.  They are wanting to know what patient can take for the Inflammation.   Please Advise.

## 2021-01-14 NOTE — Telephone Encounter (Signed)
Tried calling and office message states office is closed this afternoon.

## 2021-01-14 NOTE — Telephone Encounter (Signed)
LMOM for Janett Billow to return call.

## 2021-01-14 NOTE — Telephone Encounter (Signed)
Virgie Dad, MD  You 47 minutes ago (9:07 AM)   She is on Eliquis. But can take Aleve for few days with food. I would not recommend taking it more then 7 days,

## 2021-01-21 DIAGNOSIS — R600 Localized edema: Secondary | ICD-10-CM | POA: Diagnosis not present

## 2021-01-21 DIAGNOSIS — I5032 Chronic diastolic (congestive) heart failure: Secondary | ICD-10-CM | POA: Diagnosis not present

## 2021-01-22 ENCOUNTER — Other Ambulatory Visit: Payer: Self-pay

## 2021-01-22 DIAGNOSIS — I5032 Chronic diastolic (congestive) heart failure: Secondary | ICD-10-CM

## 2021-01-22 DIAGNOSIS — R600 Localized edema: Secondary | ICD-10-CM

## 2021-01-22 LAB — BASIC METABOLIC PANEL WITH GFR
BUN/Creatinine Ratio: 30 (calc) — ABNORMAL HIGH (ref 6–22)
BUN: 36 mg/dL — ABNORMAL HIGH (ref 7–25)
CO2: 26 mmol/L (ref 20–32)
Calcium: 8.7 mg/dL (ref 8.6–10.4)
Chloride: 108 mmol/L (ref 98–110)
Creat: 1.19 mg/dL — ABNORMAL HIGH (ref 0.60–0.88)
GFR, Est African American: 44 mL/min/{1.73_m2} — ABNORMAL LOW (ref >=60)
GFR, Est Non African American: 38 mL/min/{1.73_m2} — ABNORMAL LOW (ref >=60)
Glucose, Bld: 111 mg/dL — ABNORMAL HIGH (ref 65–99)
Potassium: 4.2 mmol/L (ref 3.5–5.3)
Sodium: 144 mmol/L (ref 135–146)

## 2021-02-10 ENCOUNTER — Other Ambulatory Visit: Payer: Self-pay | Admitting: Internal Medicine

## 2021-02-10 DIAGNOSIS — H353112 Nonexudative age-related macular degeneration, right eye, intermediate dry stage: Secondary | ICD-10-CM | POA: Diagnosis not present

## 2021-02-10 DIAGNOSIS — H353123 Nonexudative age-related macular degeneration, left eye, advanced atrophic without subfoveal involvement: Secondary | ICD-10-CM | POA: Diagnosis not present

## 2021-02-10 DIAGNOSIS — H43813 Vitreous degeneration, bilateral: Secondary | ICD-10-CM | POA: Diagnosis not present

## 2021-02-10 DIAGNOSIS — H43393 Other vitreous opacities, bilateral: Secondary | ICD-10-CM | POA: Diagnosis not present

## 2021-02-16 ENCOUNTER — Telehealth: Payer: Self-pay | Admitting: Allergy and Immunology

## 2021-02-16 NOTE — Telephone Encounter (Signed)
Patient called requesting refill for her ipratropium [facility] ([address] [place] [address])  Best contact number: [phone]

## 2021-02-17 MED ORDER — IPRATROPIUM BROMIDE 0.06 % NA SOLN: 2.0000 | Freq: Four times a day (QID) | NASAL | 5 refills | Status: DC

## 2021-02-17 NOTE — Telephone Encounter (Signed)
Called and spoke to patient to inform her that the Atrovent has been sent to her pharmacy. Patient verbally expressed understanding.

## 2021-03-02 DIAGNOSIS — H02035 Senile entropion of left lower eyelid: Secondary | ICD-10-CM | POA: Diagnosis not present

## 2021-03-02 DIAGNOSIS — H353132 Nonexudative age-related macular degeneration, bilateral, intermediate dry stage: Secondary | ICD-10-CM | POA: Diagnosis not present

## 2021-03-02 DIAGNOSIS — H04123 Dry eye syndrome of bilateral lacrimal glands: Secondary | ICD-10-CM | POA: Diagnosis not present

## 2021-03-02 DIAGNOSIS — Z961 Presence of intraocular lens: Secondary | ICD-10-CM | POA: Diagnosis not present

## 2021-03-13 ENCOUNTER — Other Ambulatory Visit: Payer: Self-pay | Admitting: Cardiovascular Disease

## 2021-03-13 ENCOUNTER — Other Ambulatory Visit: Payer: Self-pay | Admitting: Internal Medicine

## 2021-03-13 NOTE — Telephone Encounter (Signed)
High risk or very high risk warning populated when attempting to refill medication. RX request sent to PCP for review and approval if warranted.   

## 2021-03-17 ENCOUNTER — Other Ambulatory Visit: Payer: Self-pay | Admitting: *Deleted

## 2021-03-17 MED ORDER — PANTOPRAZOLE SODIUM 40 MG PO TBEC: 40.0000 mg | DELAYED_RELEASE_TABLET | Freq: Every day | ORAL | 1 refills | Status: DC

## 2021-03-17 MED ORDER — METOPROLOL SUCCINATE ER 25 MG PO TB24: 25.0000 mg | ORAL_TABLET | Freq: Every day | ORAL | 1 refills | Status: DC

## 2021-03-17 NOTE — Telephone Encounter (Signed)
Patient caregiver requested refills.

## 2021-03-18 ENCOUNTER — Other Ambulatory Visit: Payer: Self-pay | Admitting: Internal Medicine

## 2021-03-31 ENCOUNTER — Ambulatory Visit: Payer: MEDICARE | Admitting: Podiatry

## 2021-04-06 ENCOUNTER — Other Ambulatory Visit: Payer: Self-pay | Admitting: Internal Medicine

## 2021-04-09 DIAGNOSIS — L821 Other seborrheic keratosis: Secondary | ICD-10-CM | POA: Diagnosis not present

## 2021-04-09 DIAGNOSIS — L309 Dermatitis, unspecified: Secondary | ICD-10-CM | POA: Diagnosis not present

## 2021-04-14 ENCOUNTER — Other Ambulatory Visit: Payer: Self-pay

## 2021-04-14 ENCOUNTER — Ambulatory Visit (INDEPENDENT_AMBULATORY_CARE_PROVIDER_SITE_OTHER): Payer: MEDICARE | Admitting: Podiatry

## 2021-04-14 ENCOUNTER — Encounter: Payer: Self-pay | Admitting: Podiatry

## 2021-04-14 DIAGNOSIS — B351 Tinea unguium: Secondary | ICD-10-CM

## 2021-04-14 DIAGNOSIS — M79676 Pain in unspecified toe(s): Secondary | ICD-10-CM

## 2021-04-14 DIAGNOSIS — D689 Coagulation defect, unspecified: Secondary | ICD-10-CM

## 2021-04-14 DIAGNOSIS — D2372 Other benign neoplasm of skin of left lower limb, including hip: Secondary | ICD-10-CM

## 2021-04-14 DIAGNOSIS — D2371 Other benign neoplasm of skin of right lower limb, including hip: Secondary | ICD-10-CM | POA: Diagnosis not present

## 2021-04-14 NOTE — Progress Notes (Signed)
She presents today for a chief complaint of painful elongated toenails and calluses plantar aspect of the bilateral foot.  Objective: Pulses remain palpable hammertoe deformities with painfully long thick yellow dystrophic-like mycotic nails are in tact.  She also has tylomas with benign hyperkeratotic lesions.  Assessment: Pain in limb secondary to onychomycosis and benign skin lesions.  Plan: Debridement of benign skin lesions debridement of toenails 1 through 5 bilateral.

## 2021-05-13 ENCOUNTER — Other Ambulatory Visit: Payer: Self-pay | Admitting: *Deleted

## 2021-05-13 DIAGNOSIS — C50412 Malignant neoplasm of upper-outer quadrant of left female breast: Secondary | ICD-10-CM

## 2021-05-13 NOTE — Progress Notes (Signed)
[place]  Telephone:([phone]) [phone] Fax:(336) [phone]     ID: [name] DOB: [birth year]  MR#: [id number]  JYN#:[id number]  Patient Care Team: Virgie Dad, MD as PCP - General (Internal Medicine) Sanda Klein, MD as PCP - Cardiology (Cardiology) Erroll Luna, MD as Consulting Physician (General Surgery) Annette Bertelson, Virgie Dad, MD as Consulting Physician (Oncology) Arloa Koh, MD (Inactive) as Consulting Physician (Radiation Oncology) [name], RN as Registered Nurse [name], RN as Registered Nurse [name], NP (Inactive) as Nurse Practitioner (Nurse Practitioner) [name], MD as Consulting Physician (Gastroenterology) OTHER MD:  CHIEF COMPLAINT: Estrogen receptor positive breast cancer  CURRENT TREATMENT: observation   INTERVAL HISTORY: [name] returns today for for follow-up of Taylor more recent left breast cancer. She continues under observation.  [name], who is 87 years old, usually accompanies Taylor but he is currently in the emergency room with pneumonia.  Since Taylor last visit, she underwent bilateral diagnostic mammography with tomography at [facility] on 05/01/2020 showing: breast density category B; no evidence of malignancy in either breast.    REVIEW OF SYSTEMS: [name] is "feeling well for 89".  She is [name] and she is trying to keep him in independent living because she does not want to be separated if he goes to assisted living.  For exercise she does a little bit of walking and [facility].  A detailed review of systems today was benign.   COVID 19 VACCINATION STATUS: Moderna x4, last 12/2020   BREAST CANCER HISTORY: From the earlier summary note:  I saw [name] previously for a history of bilateral breast cancers, treated with bilateral lumpectomies and anti-estrogens for 5 years. She did not receive radiation treatments. She was last seen  here in 2011.  She had Taylor annual screening mammography at [facility] 08/27/2013, and this showed the breast density to be category B. There was an area of new grouped heterogeneous calcifications in the right breast at the 11:00 middle depth. A focal asymmetric density anterior to that area was seen in one view only. Additional views were recommended, but if they were performed I do not have those records. There appears to have been mammography on 03/05/2014, but again I do not have those records.  On 08/30/2014, bilateral diagnostic mammography with tomosynthesis this was obtained. The calcifications at the 11:00 position in the right breast were increased in number. There were no other significant findings. Biopsy of the area in question to 20 11/06/2014 showed ([id number]) high-grade ductal carcinoma in situ, with mucin extravasation.no definitive invasive component was seen however. This high-grade noninvasive tumor was estrogen receptor positive at 100%, progesterone receptor positive at 44%, both with strong staining intensity.  Taylor subsequent history is as detailed below   PAST MEDICAL HISTORY: Past Medical History:  Diagnosis Date   Allergic rhinitis, seasonal    Breast cancer of upper-outer quadrant of right female breast ([place]) 09/13/2014   Chronic venous insufficiency    Dermatophytosis of nail    Dysrhythmia    H/o Atrial fibrillation   Edema leg    Family history of malignant neoplasm of breast    Family history of malignant neoplasm of ovary    Goiter    right thyroid nodule    HTN (hypertension) 08/13/2017   HX: breast cancer    bilateral   Menopausal syndrome    Osteoarthritis    Osteoporosis    Squamous cell skin  cancer    right lower leg   Wears glasses    Wears hearing aid    both ears    PAST SURGICAL HISTORY: Past Surgical History:  Procedure Laterality Date   bilateral lumpectomies for breast cancer  Aug. 2007   BREAST LUMPECTOMY WITH RADIOACTIVE SEED  LOCALIZATION Right 10/01/2014   Procedure: BREAST LUMPECTOMY WITH RADIOACTIVE SEED LOCALIZATION;  Surgeon: Erroll Luna, MD;  Location: [place];  Service: General;  Laterality: Right;   BREAST LUMPECTOMY WITH RADIOACTIVE SEED LOCALIZATION Left 03/22/2019   Procedure: LEFT BREAST LUMPECTOMY WITH RADIOACTIVE SEED LOCALIZATION;  Surgeon: Erroll Luna, MD;  Location: [place];  Service: General;  Laterality: Left;   CATARACT EXTRACTION     CHOLECYSTECTOMY N/A 08/13/2017   Procedure: LAPAROSCOPIC CHOLECYSTECTOMY WITH INTRAOPERATIVE CHOLANGIOGRAM;  Surgeon: Excell Seltzer, MD;  Location: [facility];  Service: General;  Laterality: N/A;   DILATION AND CURETTAGE OF UTERUS     ESOPHAGOGASTRODUODENOSCOPY  07/26/2011   Procedure: ESOPHAGOGASTRODUODENOSCOPY (EGD);  Surgeon: Jeryl Columbia, MD;  Location: [facility];  Service: Endoscopy;  Laterality: N/A;   history of lumbar compression fracture     left eardum sx  1983   left knee arthroscopic surgery     no screening colonoscopy     s/p bilateral lumpectomies     SAVORY DILATION  07/26/2011   Procedure: SAVORY DILATION;  Surgeon: Jeryl Columbia, MD;  Location: [facility];  Service: Endoscopy;  Laterality: N/A;   status post resection squamous cell cancer right lower leg     TONSILLECTOMY     UMBILICAL HERNIA REPAIR      FAMILY HISTORY Family History  Problem Relation Age of Onset   Cancer Mother 52       breast   Heart attack Father    Heart attack Brother    Heart attack Brother    Cancer Maternal Aunt 41       breast   Coronary artery disease Other    Cancer Other        ovarian; daughter of mat aunt w/ BC in 1s   Heart disease Brother    Cancer Maternal Aunt 60       breast  the patient's father died at age 9 from a heart attack. The patient's mother died at age 44. The patient's mother was one of 46 sisters, 9 siblings. 2 of the sisters had breast cancer, postmenopausal. [name] self had 2 half-brothers and 1 full  brother as well as one half-sister.There is no history of ovarian cancer in the familyexcept as noted.   GYNECOLOGIC HISTORY:  No LMP recorded. Patient is postmenopausal. Menarche age 24, the patient is GX P0. She received antiestrogen therapy for a total of 5 yearscompleted 2011 as detailed below   SOCIAL HISTORY:  [name] herself worked in the [employer] division of [employer]. 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 [facility] [place], with no pets.    ADVANCED DIRECTIVES: in place   HEALTH MAINTENANCE: Social History   Tobacco Use   Smoking status: Never   Smokeless tobacco: Never  Vaping Use   Vaping Use: Never used  Substance Use Topics   Alcohol use: No   Drug use: No     Colonoscopy:  PAP:  Bone density:  Lipid panel:  Allergies  Allergen Reactions   Celecoxib Swelling    Swelling-leg    Current Outpatient Medications  Medication Sig Dispense Refill   atorvastatin (  LIPITOR) 40 MG tablet Take 1 tablet (40 mg total) by mouth daily. 90 tablet 1   ELIQUIS 5 MG TABS tablet TAKE 1 TABLET BY MOUTH TWICE A DAY 180 tablet 1   furosemide (LASIX) 40 MG tablet TAKE 1 TABLET BY MOUTH EVERY DAY 90 tablet 3   hydroxypropyl methylcellulose / hypromellose (ISOPTO TEARS / GONIOVISC) 2.5 % ophthalmic solution Place 1 drop into both eyes in the morning and at bedtime.     ipratropium (ATROVENT) 0.06 % nasal spray Place 2 sprays into both nostrils 4 (four) times daily. 15 mL 5   metoprolol succinate (TOPROL-XL) 25 MG 24 hr tablet Take 1 tablet (25 mg total) by mouth at bedtime. 90 tablet 1   montelukast (SINGULAIR) 10 MG tablet TAKE ONE TABLET ONCE DAILY AT NIGHT FOR COUGHING OR WHEEZING. 90 tablet 3   Multiple Vitamins-Minerals (PRESERVISION AREDS 2) CAPS Take 1 capsule by mouth 2 (two) times daily.      pantoprazole (PROTONIX) 40 MG tablet Take 1 tablet (40 mg total) by mouth daily. 90 tablet 1   potassium chloride SA  (KLOR-CON M20) 20 MEQ tablet TAKE 1 TABLET BY MOUTH EVERY DAY 90 tablet 3   No current facility-administered medications for this visit.    OBJECTIVE: white woman using a walker  Vitals:   05/14/21 1054  BP: (!) 142/74  Pulse: 70  Resp: 16  Temp: 97.9 F (36.6 C)  SpO2: 98%      Body mass index is 29.12 kg/m.    ECOG FS:2 - Symptomatic, <50% confined to bed  Sclerae unicteric, EOMs intact Wearing a mask No cervical or supraclavicular adenopathy Lungs no rales or rhonchi Heart regular rate and rhythm Abd soft, nontender, positive bowel sounds MSK no focal spinal tenderness, no upper extremity lymphedema Neuro: nonfocal, well oriented, appropriate affect Breasts: The right breast is status post remote lumpectomy.  The left breast is also status post lumpectomy.  There is no evidence of local recurrence bilaterally.  Both axillae are benign.   LAB RESULTS:  CMP     Component Value Date/Time   NA 144 05/14/2021 1035   NA 141 07/09/2019 0000   NA 145 09/19/2017 0000   NA 145 09/18/2014 1245   K 4.2 05/14/2021 1035   K 3.7 09/19/2017 0000   K 3.9 09/18/2014 1245   CL 108 05/14/2021 1035   CO2 25 05/14/2021 1035   CO2 25 09/18/2014 1245   GLUCOSE 94 05/14/2021 1035   GLUCOSE 107 09/18/2014 1245   BUN 35 (H) 05/14/2021 1035   BUN 14 07/09/2019 0000   BUN 20.1 09/18/2014 1245   CREATININE 1.06 (H) 05/14/2021 1035   CREATININE 1.19 (H) 01/21/2021 0907   CREATININE 1.2 (H) 09/18/2014 1245   CALCIUM 9.5 05/14/2021 1035   CALCIUM 7.3 09/19/2017 0000   CALCIUM 9.1 09/18/2014 1245   PROT 6.6 05/14/2021 1035   PROT 5.1 09/19/2017 0000   PROT 6.8 09/18/2014 1245   ALBUMIN 3.6 05/14/2021 1035   ALBUMIN 2.8 09/19/2017 0000   ALBUMIN 3.8 09/18/2014 1245   AST 17 05/14/2021 1035   AST 14 09/18/2014 1245   ALT 15 05/14/2021 1035   ALT 23 09/19/2017 0000   ALT 8 09/18/2014 1245   ALKPHOS 111 05/14/2021 1035   ALKPHOS 87 09/19/2017 0000   ALKPHOS 100 09/18/2014 1245    BILITOT 0.5 05/14/2021 1035   BILITOT 0.49 09/18/2014 1245   GFRNONAA 48 (L) 05/14/2021 1035   GFRNONAA 38 (L) 01/21/2021 7606  GFRAA 44 (L) 01/21/2021 0907    INo results found for: SPEP, UPEP  Lab Results  Component Value Date   WBC 6.6 05/14/2021   NEUTROABS 5.2 05/14/2021   HGB 12.0 05/14/2021   HCT 37.6 05/14/2021   MCV 93.8 05/14/2021   PLT 200 05/14/2021      Chemistry      Component Value Date/Time   NA 144 05/14/2021 1035   NA 141 07/09/2019 0000   NA 145 09/19/2017 0000   NA 145 09/18/2014 1245   K 4.2 05/14/2021 1035   K 3.7 09/19/2017 0000   K 3.9 09/18/2014 1245   CL 108 05/14/2021 1035   CO2 25 05/14/2021 1035   CO2 25 09/18/2014 1245   BUN 35 (H) 05/14/2021 1035   BUN 14 07/09/2019 0000   BUN 20.1 09/18/2014 1245   CREATININE 1.06 (H) 05/14/2021 1035   CREATININE 1.19 (H) 01/21/2021 0907   CREATININE 1.2 (H) 09/18/2014 1245   GLU 123 07/09/2019 0000      Component Value Date/Time   CALCIUM 9.5 05/14/2021 1035   CALCIUM 7.3 09/19/2017 0000   CALCIUM 9.1 09/18/2014 1245   ALKPHOS 111 05/14/2021 1035   ALKPHOS 87 09/19/2017 0000   ALKPHOS 100 09/18/2014 1245   AST 17 05/14/2021 1035   AST 14 09/18/2014 1245   ALT 15 05/14/2021 1035   ALT 23 09/19/2017 0000   ALT 8 09/18/2014 1245   BILITOT 0.5 05/14/2021 1035   BILITOT 0.49 09/18/2014 1245       Lab Results  Component Value Date   LABCA2 34 05/20/2011    No components found for: [id number]  No results for input(s): INR in the last 168 hours.  Urinalysis    Component Value Date/Time   COLORURINE YELLOW 08/12/2017 0627   APPEARANCEUR CLEAR 08/12/2017 0627   LABSPEC 1.014 08/12/2017 0627   PHURINE 6.0 08/12/2017 0627   GLUCOSEU NEGATIVE 08/12/2017 0627   HGBUR SMALL (A) 08/12/2017 0627   HGBUR trace-intact 10/28/2009 0905   BILIRUBINUR NEGATIVE 08/12/2017 0627   KETONESUR NEGATIVE 08/12/2017 0627   PROTEINUR NEGATIVE 08/12/2017 0627   UROBILINOGEN 0.2 10/28/2009 0905   NITRITE  NEGATIVE 08/12/2017 0627   LEUKOCYTESUR TRACE (A) 08/12/2017 0627    STUDIES: No results found.    ASSESSMENT: 87 y.o. BRCA negative [place] resident   (1) status post bilateral lumpectomies without sentinel lymph node biopsy in August 2007 for a right-sided 7 mm, grade 2, invasive ductal carcinoma, and a left-sided 8 mm, grade 1, invasive ductal carcinoma.  Both tumors were ER positive, Taylor-2/neu negative, both with a low proliferation fraction.   (a) On Arimidex from September 2007 until May 2009, at which time we switched to tamoxifen primarily secondary to concerns regarding osteoporosis, completing five years of antiestrogen September 2011.   (b)  Also received Zometa yearly, discontinued after 4 doses in 2011  (2)  Status post right breast upper outer quadrant biopsy 09/11/2014 4 a 4.2 cm area of ductal carcinoma in situ, grade 2 or 3,  with extravasated mucin, estrogen and progesterone receptor positive   (3) status post right lumpectomy 10/01/2014 for a pT1a pNX, stage IA invasive ductal carcinoma, grade 2, estrogen  and progesterone receptor positive, Taylor-2 negative, with an MIB-1 of 38%. Margins were close but negative   (4) she did not need adjuvant radiation   (5) opted against anti-estrogens  (a)  DEXA scan 10/31/2014 shows a T score of -1.8.  (6)  the BreastNext gene  panel performed at [facility] 09/19/2014 showed no deleterious mutations in ATM, BARD1, BRCA1, BRCA2, BRIP1, CDH1, CHEK2, MRE11A, MUTYH, NBN, NF1, PALB2, PTEN, RAD50, RAD51C, RAD51D, or TP53.  (7) status post left lumpectomy 03/28/2019 for a pT1c pNX, stage IA invasive ductal carcinoma, with negative margins; the tumor was strongly estrogen and progesterone receptor positive, Taylor-2 not amplified, with an MIB-1 of 15%.   PLAN: Yadira is now 6-1/2 years out from Taylor right lumpectomy with no evidence of disease recurrence.  She is 2 years out from Taylor left lumpectomy again with no evidence of  recurrence.  Recall she did not receive radiation or antiestrogen to either side.  I commended how well she is doing and encouraged Taylor to continue to exercise regularly.  I am hopeful [name] will recover from his current problems and can return to independent living with Taylor  Otherwise she will return to see [place] in 1 year.  She knows to call for any other issue that may develop before then.  Total encounter time 20 minutes.*   Oreta Soloway, Virgie Dad, MD  05/14/21 6:06 PM Medical Oncology and Hematology [facility] [place],  [address] Tel. [phone]    Fax. [phone]   I, Wilburn Mylar, am acting as scribe for Dr. Virgie Dad. Bronwyn Belasco.  I, Lurline Del MD, have reviewed the above documentation for accuracy and completeness, and I agree with the above.   *Total Encounter Time as defined by the Centers for Medicare and Medicaid Services includes, in addition to the face-to-face time of a patient visit (documented in the note above) non-face-to-face time: obtaining and reviewing outside history, ordering and reviewing medications, tests or procedures, care coordination (communications with other health care professionals or caregivers) and documentation in the medical record.

## 2021-05-14 ENCOUNTER — Other Ambulatory Visit: Payer: Self-pay

## 2021-05-14 ENCOUNTER — Inpatient Hospital Stay: Payer: MEDICARE | Attending: Oncology | Admitting: Oncology

## 2021-05-14 ENCOUNTER — Inpatient Hospital Stay: Payer: MEDICARE

## 2021-05-14 VITALS — BP 142/74 | HR 70 | Temp 97.9°F | Resp 16 | Ht 63.0 in | Wt 164.4 lb

## 2021-05-14 DIAGNOSIS — Z170 Estrogen receptor positive status [ER+]: Secondary | ICD-10-CM

## 2021-05-14 DIAGNOSIS — I4891 Unspecified atrial fibrillation: Secondary | ICD-10-CM | POA: Insufficient documentation

## 2021-05-14 DIAGNOSIS — Z803 Family history of malignant neoplasm of breast: Secondary | ICD-10-CM | POA: Diagnosis not present

## 2021-05-14 DIAGNOSIS — C50411 Malignant neoplasm of upper-outer quadrant of right female breast: Secondary | ICD-10-CM | POA: Insufficient documentation

## 2021-05-14 DIAGNOSIS — C50412 Malignant neoplasm of upper-outer quadrant of left female breast: Secondary | ICD-10-CM | POA: Diagnosis not present

## 2021-05-14 DIAGNOSIS — Z9049 Acquired absence of other specified parts of digestive tract: Secondary | ICD-10-CM | POA: Insufficient documentation

## 2021-05-14 DIAGNOSIS — I10 Essential (primary) hypertension: Secondary | ICD-10-CM | POA: Diagnosis not present

## 2021-05-14 DIAGNOSIS — Z8249 Family history of ischemic heart disease and other diseases of the circulatory system: Secondary | ICD-10-CM | POA: Insufficient documentation

## 2021-05-14 DIAGNOSIS — Z8041 Family history of malignant neoplasm of ovary: Secondary | ICD-10-CM | POA: Diagnosis not present

## 2021-05-14 DIAGNOSIS — Z7901 Long term (current) use of anticoagulants: Secondary | ICD-10-CM | POA: Diagnosis not present

## 2021-05-14 LAB — CMP (CANCER CENTER ONLY)
ALT: 15 U/L (ref 0–44)
AST: 17 U/L (ref 15–41)
Albumin: 3.6 g/dL (ref 3.5–5.0)
Alkaline Phosphatase: 111 U/L (ref 38–126)
Anion gap: 11 (ref 5–15)
BUN: 35 mg/dL — ABNORMAL HIGH (ref 8–23)
CO2: 25 mmol/L (ref 22–32)
Calcium: 9.5 mg/dL (ref 8.9–10.3)
Chloride: 108 mmol/L (ref 98–111)
Creatinine: 1.06 mg/dL — ABNORMAL HIGH (ref 0.44–1.00)
GFR, Estimated: 48 mL/min — ABNORMAL LOW (ref >60)
Glucose, Bld: 94 mg/dL (ref 70–99)
Potassium: 4.2 mmol/L (ref 3.5–5.1)
Sodium: 144 mmol/L (ref 135–145)
Total Bilirubin: 0.5 mg/dL (ref 0.3–1.2)
Total Protein: 6.6 g/dL (ref 6.5–8.1)

## 2021-05-14 LAB — CBC WITH DIFFERENTIAL (CANCER CENTER ONLY)
Abs Immature Granulocytes: 0.02 10*3/uL (ref 0.00–0.07)
Basophils Absolute: 0.1 10*3/uL (ref 0.0–0.1)
Basophils Relative: 1 %
Eosinophils Absolute: 0 10*3/uL (ref 0.0–0.5)
Eosinophils Relative: 1 %
HCT: 37.6 % (ref 36.0–46.0)
Hemoglobin: 12 g/dL (ref 12.0–15.0)
Immature Granulocytes: 0 %
Lymphocytes Relative: 13 %
Lymphs Abs: 0.8 10*3/uL (ref 0.7–4.0)
MCH: 29.9 pg (ref 26.0–34.0)
MCHC: 31.9 g/dL (ref 30.0–36.0)
MCV: 93.8 fL (ref 80.0–100.0)
Monocytes Absolute: 0.5 10*3/uL (ref 0.1–1.0)
Monocytes Relative: 7 %
Neutro Abs: 5.2 10*3/uL (ref 1.7–7.7)
Neutrophils Relative %: 78 %
Platelet Count: 200 10*3/uL (ref 150–400)
RBC: 4.01 MIL/uL (ref 3.87–5.11)
RDW: 13.7 % (ref 11.5–15.5)
WBC Count: 6.6 10*3/uL (ref 4.0–10.5)
nRBC: 0 % (ref 0.0–0.2)

## 2021-05-20 DIAGNOSIS — Z853 Personal history of malignant neoplasm of breast: Secondary | ICD-10-CM | POA: Diagnosis not present

## 2021-05-20 DIAGNOSIS — R928 Other abnormal and inconclusive findings on diagnostic imaging of breast: Secondary | ICD-10-CM | POA: Diagnosis not present

## 2021-06-01 ENCOUNTER — Other Ambulatory Visit: Payer: Self-pay | Admitting: Radiology

## 2021-06-01 DIAGNOSIS — Z853 Personal history of malignant neoplasm of breast: Secondary | ICD-10-CM | POA: Diagnosis not present

## 2021-06-01 DIAGNOSIS — N6311 Unspecified lump in the right breast, upper outer quadrant: Secondary | ICD-10-CM | POA: Diagnosis not present

## 2021-06-19 ENCOUNTER — Telehealth: Payer: Self-pay

## 2021-06-19 ENCOUNTER — Ambulatory Visit: Payer: Self-pay | Admitting: Surgery

## 2021-06-19 DIAGNOSIS — N6311 Unspecified lump in the right breast, upper outer quadrant: Secondary | ICD-10-CM

## 2021-06-19 DIAGNOSIS — Z853 Personal history of malignant neoplasm of breast: Secondary | ICD-10-CM | POA: Diagnosis not present

## 2021-06-19 NOTE — Telephone Encounter (Signed)
   [facility] Pre-operative Risk Assessment    Patient Name: [name]  DOB: 08-02-1921 MRN: [id number]  Request for surgical clearance:  What type of surgery is being performed? RIGHT BREST LUMPECTOMY  When is this surgery scheduled? TBD  What type of clearance is required (medical clearance vs. Pharmacy clearance to hold med vs. Both)? MEDICAL  Are there any medications that need to be held prior to surgery and how long? [place] name and name of physician performing surgery? [facility] Erroll Luna, MD  ATTN:Urijah Arko BROOKS  What is the office phone number? [phone]   7.   What is the office fax number? [phone]  8.   Anesthesia type (None, local, MAC, general) ? GENERAL

## 2021-06-22 NOTE — Telephone Encounter (Signed)
Patient with diagnosis of atrial fibrillation on Eliquis for anticoagulation.    Procedure: right breast lumpectomy Date of procedure: TBD   CHA2DS2-VASc Score = 6   This indicates a 9.7% annual risk of stroke. The patient's score is based upon: CHF History: 1 HTN History: 1 Diabetes History: 0 Stroke History: 0 Vascular Disease History: 1 Age Score: 2 Gender Score: 1    CrCl 29 (with adjusted body weight) Platelet count 200  Per office protocol, patient can hold Eliquis for 3 days prior to procedure.   Patient will not need bridging with Lovenox (enoxaparin) around procedure.

## 2021-06-22 NOTE — Telephone Encounter (Signed)
Dr. Sallyanne Kuster  pt with NSTEMI occurred with acute cholecystitis/cholecystectomy treated medically, and while you suspect underlying CAD,  but conservative management preferred in view of age and preserved left ventricular systolic function.  We will hold her eliquis for 2 days, we will call to see if any cardiology issues, but now with possible recurrent breast cancer was going to clear for lumpectomy.  She has not been seen since March but unless there is a cardiac issue or you have other recommendations I will clear.  thanks

## 2021-06-23 NOTE — Telephone Encounter (Signed)
   Name: [name]  DOB: March 02, 1922  MRN: [id number]   Primary Cardiologist: Sanda Klein, MD  Chart reviewed as part of pre-operative protocol coverage. Patient was contacted 06/23/2021 in reference to pre-operative risk assessment for pending surgery as outlined below.  [name] was last seen on 10/03/20 by Dr. Sallyanne Kuster.  Since that day, [name] has done well. She denies chest pain and shortness of breath. While she may have underlying heart disease, echocardiogram showed preserved EF and no WMA. She has increased risk given her age, but we do not feel that additional ischemic evaluation would mitigate her risk at this point.   Per our clinical pharmacist: Per office protocol, patient can hold Eliquis for 3 days prior to procedure.   Patient will not need bridging with Lovenox (enoxaparin) around procedure  Therefore, based on ACC/AHA guidelines, the patient would be at acceptable risk for the planned procedure without further cardiovascular testing.   The patient was advised that if she develops new symptoms prior to surgery to contact our office to arrange for a follow-up visit, and she verbalized understanding.  I will route this recommendation to the requesting party via Epic fax function and remove from pre-op pool. Please call with questions.  [place], PA 06/23/2021, 2:04 PM

## 2021-07-08 ENCOUNTER — Encounter: Payer: Self-pay | Admitting: Internal Medicine

## 2021-07-08 ENCOUNTER — Other Ambulatory Visit: Payer: Self-pay

## 2021-07-08 ENCOUNTER — Encounter: Payer: MEDICARE | Admitting: Internal Medicine

## 2021-07-09 NOTE — Progress Notes (Signed)
A user error has taken place.

## 2021-07-16 ENCOUNTER — Other Ambulatory Visit: Payer: Self-pay | Admitting: Internal Medicine

## 2021-07-16 ENCOUNTER — Encounter: Payer: Self-pay | Admitting: Podiatry

## 2021-07-16 ENCOUNTER — Ambulatory Visit (INDEPENDENT_AMBULATORY_CARE_PROVIDER_SITE_OTHER): Payer: MEDICARE | Admitting: Podiatry

## 2021-07-16 ENCOUNTER — Other Ambulatory Visit: Payer: Self-pay

## 2021-07-16 DIAGNOSIS — D689 Coagulation defect, unspecified: Secondary | ICD-10-CM | POA: Diagnosis not present

## 2021-07-16 DIAGNOSIS — D2372 Other benign neoplasm of skin of left lower limb, including hip: Secondary | ICD-10-CM

## 2021-07-16 DIAGNOSIS — M79676 Pain in unspecified toe(s): Secondary | ICD-10-CM

## 2021-07-16 DIAGNOSIS — B351 Tinea unguium: Secondary | ICD-10-CM

## 2021-07-16 DIAGNOSIS — D2371 Other benign neoplasm of skin of right lower limb, including hip: Secondary | ICD-10-CM

## 2021-07-16 MED ORDER — KETOCONAZOLE 2 % EX CREA: 1.0000 "application " | TOPICAL_CREAM | Freq: Two times a day (BID) | CUTANEOUS | 2 refills | Status: DC

## 2021-07-16 NOTE — Progress Notes (Signed)
She presents today chief complaint of painful elongated toenails and calluses bilaterally.  She also states that she feels like she may have a rash.  Objective: Vital signs are stable she is alert and oriented x3.  Pulses are palpable bilateral.  Subsecond metatarsal head and third metatarsal head bilaterally demonstrates severe fat pad atrophy with a thick benign skin lesions.  No open lesions or wounds are noted.  She does have xerosis with what appears to be a moccasin distribution of tinea pedis.  Otherwise she also has long thick yellow dystrophic-like mycotic nails.  Assessment: Pain in limb secondary to onychomycosis also secondary to benign skin lesions.  She also has tinea pedis bilateral.  Plan: Wrote prescription for ketoconazole.  Debrided nails 1 through 5 bilateral debrided reactive hyperkeratotic benign skin lesions and placed padding.  Follow-up with her on an as-needed basis I do believe that she is going start using the podiatrist at [facility] [place] Dr. Yaakov Guthrie.

## 2021-08-06 NOTE — Progress Notes (Signed)
Surgical Instructions    Your procedure is scheduled on Thursday January 26th.  Report to [facility] Main Entrance "A" at 5:30 A.M., then check in with the Admitting office.  Call this number if you have problems the morning of surgery:  [phone]   If you have any questions prior to your surgery date call [phone]: Open Monday-Friday 8am-4pm    Remember:  Do not eat after midnight the night before your surgery  You may drink clear liquids until 4:30am the morning of your surgery.   Clear liquids allowed are: Water, Non-Citrus Juices (without pulp), Carbonated Beverages, Clear Tea, Black Coffee ONLY (NO MILK, CREAM OR POWDERED CREAMER of any kind), and Gatorade    Take these medicines the morning of surgery with A SIP OF WATER atorvastatin (LIPITOR) 40 MG tablet ipratropium (ATROVENT) 0.06 % nasal spray pantoprazole (PROTONIX) 40 MG tablet  IF NEEDED  montelukast (SINGULAIR) 10 MG tablet    Follow your surgeon's instructions on when to stop Eliquis.  If no instructions were given by your surgeon then you will need to call the office to get those instructions.    As of today, STOP taking any Aspirin (unless otherwise instructed by your surgeon) Aleve, Naproxen, Ibuprofen, Motrin, Advil, Goody's, BC's, all herbal medications, fish oil, and all vitamins.  After your COVID test   You are not required to quarantine however you are required to wear a well-fitting mask when you are out and around people not in your household.  If your mask becomes wet or soiled, replace with a new one.  Wash your hands often with soap and water for 20 seconds or clean your hands with an alcohol-based hand sanitizer that contains at least 60% alcohol.  Do not share personal items.  Notify your provider: if you are in close contact with someone who has COVID  or if you develop a fever of 100.4 or greater, sneezing, cough, sore throat, shortness of breath or body aches.           Do not wear  jewelry or makeup Do not wear lotions, powders, perfumes, or deodorant. Do not shave 48 hours prior to surgery.   Do not bring valuables to the hospital. DO Not wear nail polish, gel polish, artificial nails, or any other type of covering on natural nails (fingers and toes) If you have artificial nails or gel coating that need to be removed by a nail salon, please have this removed prior to surgery. Artificial nails or gel coating may interfere with anesthesia's ability to adequately monitor your vital signs.             [facility] is not responsible for any belongings or valuables.  Do NOT Smoke (Tobacco/Vaping)  24 hours prior to your procedure  If you use a CPAP at night, you may bring your mask for your overnight stay.   Contacts, glasses, hearing aids, dentures or partials may not be worn into surgery, please bring cases for these belongings   For patients admitted to the hospital, discharge time will be determined by your treatment team.   Patients discharged the day of surgery will not be allowed to drive home, and someone needs to stay with them for 24 hours.  NO VISITORS WILL BE ALLOWED IN PRE-OP WHERE PATIENTS ARE PREPPED FOR SURGERY.  ONLY 1 SUPPORT PERSON MAY BE PRESENT IN THE WAITING ROOM WHILE YOU ARE IN SURGERY.  IF YOU ARE TO BE ADMITTED, ONCE YOU ARE IN YOUR ROOM YOU  WILL BE ALLOWED TWO (2) VISITORS. 1 (ONE) VISITOR MAY STAY OVERNIGHT BUT MUST ARRIVE TO THE ROOM BY 8pm.  Minor children may have two parents present. Special consideration for safety and communication needs will be reviewed on a case by case basis.  Special instructions:    Oral Hygiene is also important to reduce your risk of infection.  Remember - BRUSH YOUR TEETH THE MORNING OF SURGERY WITH YOUR REGULAR TOOTHPASTE   [place] [facility]- Preparing For Surgery  Before surgery, you can play an important role. Because skin is not sterile, your skin needs to be as free of germs as possible. You can reduce the number  of germs on your skin by washing with CHG (chlorahexidine gluconate) Soap before surgery.  CHG is an antiseptic cleaner which kills germs and bonds with the skin to continue killing germs even after washing.     Please do not use if you have an allergy to CHG or antibacterial soaps. If your skin becomes reddened/irritated stop using the CHG.  Do not shave (including legs and underarms) for at least 48 hours prior to first CHG shower. It is OK to shave your face.  Please follow these instructions carefully.     Shower the NIGHT BEFORE SURGERY and the MORNING OF SURGERY with CHG Soap.   If you chose to wash your hair, wash your hair first as usual with your normal shampoo. After you shampoo, rinse your hair and body thoroughly to remove the shampoo.  Then [employer] and genitals (private parts) with your normal soap and rinse thoroughly to remove soap.  After that Use CHG Soap as you would any other liquid soap. You can apply CHG directly to the skin and wash gently with a scrungie or a clean washcloth.   Apply the CHG Soap to your body ONLY FROM THE NECK DOWN.  Do not use on open wounds or open sores. Avoid contact with your eyes, ears, mouth and genitals (private parts). Wash Face and genitals (private parts)  with your normal soap.   Wash thoroughly, paying special attention to the area where your surgery will be performed.  Thoroughly rinse your body with warm water from the neck down.  DO NOT shower/wash with your normal soap after using and rinsing off the CHG Soap.  Pat yourself dry with a CLEAN TOWEL.  Wear CLEAN PAJAMAS to bed the night before surgery  Place CLEAN SHEETS on your bed the night before your surgery  DO NOT SLEEP WITH PETS.   Day of Surgery:  Take a shower with CHG soap. Wear Clean/Comfortable clothing the morning of surgery Do not apply any deodorants/lotions.   Remember to brush your teeth WITH YOUR REGULAR TOOTHPASTE.   Please read over the following fact  sheets that you were given.

## 2021-08-07 ENCOUNTER — Encounter (HOSPITAL_COMMUNITY)
Admission: RE | Admit: 2021-08-07 | Discharge: 2021-08-07 | Disposition: A | Discharge status: Home / Self Care | Payer: MEDICARE | Source: Ambulatory Visit | Attending: Surgery | Admitting: Surgery

## 2021-08-07 ENCOUNTER — Other Ambulatory Visit: Payer: Self-pay

## 2021-08-07 ENCOUNTER — Encounter (HOSPITAL_COMMUNITY): Payer: Self-pay

## 2021-08-07 VITALS — BP 148/58 | HR 77 | Temp 98.3°F | Resp 18 | Ht 62.0 in | Wt 164.7 lb

## 2021-08-07 DIAGNOSIS — I872 Venous insufficiency (chronic) (peripheral): Secondary | ICD-10-CM | POA: Insufficient documentation

## 2021-08-07 DIAGNOSIS — Z01812 Encounter for preprocedural laboratory examination: Secondary | ICD-10-CM | POA: Diagnosis not present

## 2021-08-07 DIAGNOSIS — N6310 Unspecified lump in the right breast, unspecified quadrant: Secondary | ICD-10-CM | POA: Diagnosis not present

## 2021-08-07 DIAGNOSIS — E049 Nontoxic goiter, unspecified: Secondary | ICD-10-CM | POA: Insufficient documentation

## 2021-08-07 DIAGNOSIS — I10 Essential (primary) hypertension: Secondary | ICD-10-CM | POA: Insufficient documentation

## 2021-08-07 DIAGNOSIS — I252 Old myocardial infarction: Secondary | ICD-10-CM | POA: Diagnosis not present

## 2021-08-07 DIAGNOSIS — Z853 Personal history of malignant neoplasm of breast: Secondary | ICD-10-CM | POA: Insufficient documentation

## 2021-08-07 LAB — BASIC METABOLIC PANEL
Anion gap: 9 (ref 5–15)
BUN: 32 mg/dL — ABNORMAL HIGH (ref 8–23)
CO2: 25 mmol/L (ref 22–32)
Calcium: 9 mg/dL (ref 8.9–10.3)
Chloride: 110 mmol/L (ref 98–111)
Creatinine, Ser: 1.12 mg/dL — ABNORMAL HIGH (ref 0.44–1.00)
GFR, Estimated: 45 mL/min — ABNORMAL LOW (ref >60)
Glucose, Bld: 112 mg/dL — ABNORMAL HIGH (ref 70–99)
Potassium: 4.1 mmol/L (ref 3.5–5.1)
Sodium: 144 mmol/L (ref 135–145)

## 2021-08-07 LAB — CBC
HCT: 38.3 % (ref 36.0–46.0)
Hemoglobin: 12.2 g/dL (ref 12.0–15.0)
MCH: 30 pg (ref 26.0–34.0)
MCHC: 31.9 g/dL (ref 30.0–36.0)
MCV: 94.3 fL (ref 80.0–100.0)
Platelets: 182 10*3/uL (ref 150–400)
RBC: 4.06 MIL/uL (ref 3.87–5.11)
RDW: 13.4 % (ref 11.5–15.5)
WBC: 6 10*3/uL (ref 4.0–10.5)
nRBC: 0 % (ref 0.0–0.2)

## 2021-08-07 NOTE — Progress Notes (Signed)
PCP - Veleta Miners, MD Cardiologist - Dr. Sallyanne Kuster  Chest x-ray - Not indicated EKG - 10/03/20 Stress Test - Denies ECHO - 08/26/17 Cardiac Cath - Denies  Sleep Study - Denies  DM - Denies  Blood Thinner Instructions: Eliquis stopping 3 days prior  ERAS Protcol - Yes  Anesthesia review: Yes cardiac history  Patient denies shortness of breath, fever, cough and chest pain at PAT appointment   All instructions explained to the patient, with a verbal understanding of the material. Patient agrees to go over the instructions while at home for a better understanding.  The opportunity to ask questions was provided.

## 2021-08-11 NOTE — Anesthesia Preprocedure Evaluation (Addendum)
Anesthesia Evaluation  Patient identified by MRN, date of birth, ID band Patient awake    Reviewed: Allergy & Precautions, H&P , NPO status , Patient's Chart, lab work & pertinent test results, reviewed documented beta blocker date and time   Airway Mallampati: II   Neck ROM: full    Dental   Pulmonary neg pulmonary ROS,    breath sounds clear to auscultation       Cardiovascular hypertension, Pt. on home beta blockers and Pt. on medications + CAD and + Past MI  + dysrhythmias Atrial Fibrillation  Rhythm:irregular Rate:Normal  Echo 08/26/17: mild LVH, EF 50-55%, akinesis of the apical myocardium, grade 2 DD, mild AR, moderate MR, moderate LAE, moderate TR, PASP moderately increased     Neuro/Psych    GI/Hepatic GERD  ,  Endo/Other    Renal/GU Renal InsufficiencyRenal disease     Musculoskeletal  (+) Arthritis ,   Abdominal   Peds  Hematology   Anesthesia Other Findings   Reproductive/Obstetrics                           Anesthesia Physical Anesthesia Plan  ASA: 3  Anesthesia Plan: General   Post-op Pain Management: Tylenol PO (pre-op)   Induction: Intravenous  PONV Risk Score and Plan: 3 and Ondansetron, Treatment may vary due to age or medical condition and Dexamethasone  Airway Management Planned: LMA  Additional Equipment: None  Intra-op Plan:   Post-operative Plan: Extubation in OR  Informed Consent: I have reviewed the patients History and Physical, chart, labs and discussed the procedure including the risks, benefits and alternatives for the proposed anesthesia with the patient or authorized representative who has indicated his/her understanding and acceptance.     Dental advisory given  Plan Discussed with: CRNA, Anesthesiologist and Surgeon  Anesthesia Plan Comments: (PAT note written 08/11/2021 by Myra Gianotti, PA-C. )      Anesthesia Quick Evaluation

## 2021-08-11 NOTE — Progress Notes (Addendum)
Anesthesia Chart Review:  Case: [id number] Date/Time: 08/13/21 0715   Procedure: RIGHT BREAST LUMPECTOMY WITH RADIOACTIVE SEED LOCALIZATION (Right: Breast)   Anesthesia type: General   Pre-op diagnosis: RIGHT BREAST MASS   Location: [facility] OR ROOM 09 / [facility] OR   Surgeons: Erroll Luna, MD       DISCUSSION: Patient is a 88 year old female scheduled for the above procedure. She has known history of breast cancer, s/p bilateral lumpectomies. 06/01/21 right breast biopsy + atypical epithelial proliferation, but features concerning for malignant process and excisional biopsy recommended by pathologist.   History includes never smoker, HTN, NSTEMI (08/25/17 in setting of rapid afib ~10 days post-cholecystectomy, likely demand ischemia from tachycardia rather than ACS but with suspected LAD disease given new apical akinesis on 08/26/17 echo; conservative therapy), afib/PAF (08/2017), edema/chronic venous insufficiency, goiter (right thyroid nodule), skin cancer, wears hearing aids, breast cancer (s/p bilateral lumpectomies with SNL biopsy 02/2006; DCIS right, s/p right breast lumpectomy 10/01/14; skin involvement over left breast cancer despite antiestrogen therapy, s/p left breast lumpectomy 03/22/19 for invasive ductal carcinoma).   Notes indicate she lives at [facility] with her 60 year old husband.    Preoperative cardiology input outlined by Fabian Sharp, [place] on 06/23/21,  "Patient was contacted 06/23/2021 in reference to pre-operative risk assessment for pending surgery as outlined below.  LORALIE MALTA was last seen on 10/03/20 by Dr. Sallyanne Kuster.  Since that day, VARVARA LEGAULT has done well. She denies chest pain and shortness of breath. While she may have underlying heart disease, echocardiogram showed preserved EF and no WMA. She has increased risk given her age, but we do not feel that additional ischemic evaluation would mitigate her risk at this point.    Per our clinical pharmacist: Per office  protocol, patient can hold Eliquis for 3 days prior to procedure.   Patient will not need bridging with Lovenox (enoxaparin) around procedure   Therefore, based on ACC/AHA guidelines, the patient would be at acceptable risk for the planned procedure without further cardiovascular testing." Dr. Sallyanne Kuster also added, "I would go ahead and clear for breast surgery, which is a low risk procedure. Clearly her risk is increased by her age, but I do not think invasive evaluation or a change in treatment plan with improve her risk."  RSL 08/12/21 at 10:15 AM. Per PAT RN documentation, holding Eliquis 3 days prior. Anesthesia team to evaluate on the day of surgery.    VS: BP (!) 148/58    Pulse 77    Temp 36.8 C (Oral)    Resp 18    Ht 5\' 2"  (1.575 m)    Wt 74.7 kg    SpO2 100%    BMI 30.12 kg/m    PROVIDERS: Virgie Dad, MD is PCP  - Sanda Klein, MD is cardiologist - Magrinat, Sarajane Jews, MD is HEM-ONC   LABS: Labs reviewed: Acceptable for surgery. A1c 5.8% 01/01/21. (all labs ordered are listed, but only abnormal results are displayed)  Labs Reviewed  BASIC METABOLIC PANEL - Abnormal; Notable for the following components:      Result Value   Glucose, Bld 112 (*)    BUN 32 (*)    Creatinine, Ser 1.12 (*)    GFR, Estimated 45 (*)    All other components within normal limits  CBC     EKG: 10/03/20 ([facility]): Normal sinus rhythm Septal infarct, age undetermined   CV: Echo 08/26/17: Study Conclusions - Left ventricle: The cavity size was normal.  Wall thickness was   increased in a pattern of mild LVH. Systolic function was normal.   The estimated ejection fraction was in the range of 50% to 55%.   There is akinesis of the apical myocardium. Features are   consistent with a pseudonormal left ventricular filling pattern,   with concomitant abnormal relaxation and increased filling   pressure (grade 2 diastolic dysfunction). - Aortic valve: Trileaflet; mildly thickened, mildly  calcified   leaflets. There was mild regurgitation. - Mitral valve: Severely calcified annulus. Mildly thickened,   mildly calcified leaflets . There was moderate regurgitation. - Left atrium: The atrium was mildly to moderately dilated.   Volume/bsa, S: 43.1 ml/m^2. - Tricuspid valve: There was moderate regurgitation. - Pulmonary arteries: Systolic pressure was moderately increased. (Comparison: 08/12/17: LVEF 55-60%, no regional wall motion abnormalities; grade 2 DD, mild MS, moderate MR, moderate TR, PA peak pressure 59 mmHG, small pericardial effusion)     Past Medical History:  Diagnosis Date   Allergic rhinitis, seasonal    Breast cancer of upper-outer quadrant of right female breast ([place]) 09/13/2014   Chronic venous insufficiency    Dermatophytosis of nail    Dysrhythmia    H/o Atrial fibrillation   Edema leg    Family history of malignant neoplasm of breast    Family history of malignant neoplasm of ovary    Goiter    right thyroid nodule    HTN (hypertension) 08/13/2017   HX: breast cancer    bilateral   Menopausal syndrome    Osteoarthritis    Osteoporosis    Squamous cell skin cancer    right lower leg   Wears glasses    Wears hearing aid    both ears    Past Surgical History:  Procedure Laterality Date   bilateral lumpectomies for breast cancer  Aug. 2007   BREAST LUMPECTOMY WITH RADIOACTIVE SEED LOCALIZATION Right 10/01/2014   Procedure: BREAST LUMPECTOMY WITH RADIOACTIVE SEED LOCALIZATION;  Surgeon: Erroll Luna, MD;  Location: [facility];  Service: General;  Laterality: Right;   BREAST LUMPECTOMY WITH RADIOACTIVE SEED LOCALIZATION Left 03/22/2019   Procedure: LEFT BREAST LUMPECTOMY WITH RADIOACTIVE SEED LOCALIZATION;  Surgeon: Erroll Luna, MD;  Location: [facility];  Service: General;  Laterality: Left;   CATARACT EXTRACTION     CHOLECYSTECTOMY N/A 08/13/2017   Procedure: LAPAROSCOPIC CHOLECYSTECTOMY WITH INTRAOPERATIVE CHOLANGIOGRAM;  Surgeon:  Excell Seltzer, MD;  Location: [facility];  Service: General;  Laterality: N/A;   DILATION AND CURETTAGE OF UTERUS     ESOPHAGOGASTRODUODENOSCOPY  07/26/2011   Procedure: ESOPHAGOGASTRODUODENOSCOPY (EGD);  Surgeon: Jeryl Columbia, MD;  Location: [facility];  Service: Endoscopy;  Laterality: N/A;   history of lumbar compression fracture     left eardum sx  1983   left knee arthroscopic surgery     no screening colonoscopy     s/p bilateral lumpectomies     SAVORY DILATION  07/26/2011   Procedure: SAVORY DILATION;  Surgeon: Jeryl Columbia, MD;  Location: [facility];  Service: Endoscopy;  Laterality: N/A;   status post resection squamous cell cancer right lower leg     TONSILLECTOMY     UMBILICAL HERNIA REPAIR      MEDICATIONS:  atorvastatin (LIPITOR) 40 MG tablet   calcium carbonate (TUMS - DOSED IN MG ELEMENTAL CALCIUM) 500 MG chewable tablet   ELIQUIS 5 MG TABS tablet   furosemide (LASIX) 40 MG tablet   hydroxypropyl methylcellulose / hypromellose (ISOPTO TEARS / GONIOVISC) 2.5 % ophthalmic  solution   ipratropium (ATROVENT) 0.06 % nasal spray   ketoconazole (NIZORAL) 2 % cream   metoprolol succinate (TOPROL-XL) 25 MG 24 hr tablet   montelukast (SINGULAIR) 10 MG tablet   Multiple Vitamins-Minerals (PRESERVISION AREDS 2) CAPS   pantoprazole (PROTONIX) 40 MG tablet   potassium chloride SA (KLOR-CON M20) 20 MEQ tablet   No current facility-administered medications for this encounter.    Myra Gianotti, PA-C Surgical Short Stay/Anesthesiology [facility] Phone [phone] [facility] Phone [phone] 08/11/2021 1:39 PM

## 2021-08-12 DIAGNOSIS — N6311 Unspecified lump in the right breast, upper outer quadrant: Secondary | ICD-10-CM | POA: Diagnosis not present

## 2021-08-13 ENCOUNTER — Ambulatory Visit (HOSPITAL_COMMUNITY)
Admission: RE | Admit: 2021-08-13 | Discharge: 2021-08-13 | Disposition: A | Discharge status: Home / Self Care | Payer: MEDICARE | Attending: Surgery | Admitting: Surgery

## 2021-08-17 NOTE — Progress Notes (Signed)
Daughter given an update on change of surgery time of  3:45PM and to stop eating solids at midnight, and stop drinking clear liquids by 12:45PM.

## 2021-08-18 ENCOUNTER — Observation Stay (HOSPITAL_COMMUNITY)
Admission: RE | Admit: 2021-08-18 | Discharge: 2021-08-19 | Disposition: A | Discharge status: Home / Self Care | Payer: MEDICARE | Source: Ambulatory Visit | Attending: Surgery | Admitting: Surgery

## 2021-08-18 ENCOUNTER — Encounter (HOSPITAL_COMMUNITY): Payer: Self-pay | Admitting: Surgery

## 2021-08-18 ENCOUNTER — Ambulatory Visit (HOSPITAL_COMMUNITY): Payer: MEDICARE | Admitting: Anesthesiology

## 2021-08-18 ENCOUNTER — Encounter (HOSPITAL_COMMUNITY)
Admission: RE | Disposition: A | Discharge status: Home / Self Care | Payer: Self-pay | Source: Ambulatory Visit | Attending: Surgery

## 2021-08-18 ENCOUNTER — Encounter: Payer: MEDICARE | Admitting: Nurse Practitioner

## 2021-08-18 ENCOUNTER — Ambulatory Visit (HOSPITAL_COMMUNITY): Payer: MEDICARE | Admitting: Vascular Surgery

## 2021-08-18 ENCOUNTER — Other Ambulatory Visit: Payer: Self-pay

## 2021-08-18 DIAGNOSIS — I10 Essential (primary) hypertension: Secondary | ICD-10-CM | POA: Diagnosis not present

## 2021-08-18 DIAGNOSIS — C50411 Malignant neoplasm of upper-outer quadrant of right female breast: Secondary | ICD-10-CM | POA: Diagnosis not present

## 2021-08-18 DIAGNOSIS — Z79899 Other long term (current) drug therapy: Secondary | ICD-10-CM | POA: Diagnosis not present

## 2021-08-18 DIAGNOSIS — Z170 Estrogen receptor positive status [ER+]: Secondary | ICD-10-CM | POA: Insufficient documentation

## 2021-08-18 DIAGNOSIS — C50911 Malignant neoplasm of unspecified site of right female breast: Secondary | ICD-10-CM | POA: Diagnosis not present

## 2021-08-18 DIAGNOSIS — I252 Old myocardial infarction: Secondary | ICD-10-CM | POA: Diagnosis not present

## 2021-08-18 DIAGNOSIS — N6311 Unspecified lump in the right breast, upper outer quadrant: Secondary | ICD-10-CM

## 2021-08-18 HISTORY — PX: BREAST LUMPECTOMY WITH RADIOACTIVE SEED LOCALIZATION: SHX6424

## 2021-08-18 SURGERY — BREAST LUMPECTOMY WITH RADIOACTIVE SEED LOCALIZATION
Anesthesia: General | Site: Breast | Laterality: Right

## 2021-08-18 MED ORDER — ACETAMINOPHEN 500 MG PO TABS
1000.0000 mg | ORAL_TABLET | Freq: Once | ORAL | Status: DC
  Filled 2021-08-18: qty 2

## 2021-08-18 MED ORDER — LACTATED RINGERS IV SOLN: INTRAVENOUS | Status: DC

## 2021-08-18 MED ORDER — ONDANSETRON HCL 4 MG/2ML IJ SOLN: 4.0000 mg | Freq: Four times a day (QID) | INTRAMUSCULAR | Status: DC | PRN

## 2021-08-18 MED ORDER — CHLORHEXIDINE GLUCONATE 0.12 % MT SOLN
15.0000 mL | Freq: Once | OROMUCOSAL | Status: AC
  Administered 2021-08-18: 15 mL via OROMUCOSAL
  Filled 2021-08-18: qty 15

## 2021-08-18 MED ORDER — ACETAMINOPHEN 500 MG PO TABS
1000.0000 mg | ORAL_TABLET | Freq: Four times a day (QID) | ORAL | Status: DC
  Administered 2021-08-18: 1000 mg via ORAL
  Filled 2021-08-18: qty 2

## 2021-08-18 MED ORDER — OXYCODONE HCL 5 MG/5ML PO SOLN: 5.0000 mg | Freq: Once | ORAL | Status: DC | PRN

## 2021-08-18 MED ORDER — FUROSEMIDE 40 MG PO TABS
40.0000 mg | ORAL_TABLET | Freq: Every day | ORAL | Status: DC
Start: 2021-08-18 — End: 2021-08-19
  Administered 2021-08-18 – 2021-08-19 (×2): 40 mg via ORAL
  Filled 2021-08-18 (×2): qty 1

## 2021-08-18 MED ORDER — LIDOCAINE 2% (20 MG/ML) 5 ML SYRINGE
INTRAMUSCULAR | Status: DC | PRN
Start: 2021-08-18 — End: 2021-08-18
  Administered 2021-08-18: 100 mg via INTRAVENOUS

## 2021-08-18 MED ORDER — ENOXAPARIN SODIUM 40 MG/0.4ML IJ SOSY
40.0000 mg | PREFILLED_SYRINGE | INTRAMUSCULAR | Status: DC
  Administered 2021-08-19: 40 mg via SUBCUTANEOUS
  Filled 2021-08-18: qty 0.4

## 2021-08-18 MED ORDER — PROPOFOL 10 MG/ML IV BOLUS
INTRAVENOUS | Status: AC
  Filled 2021-08-18: qty 20

## 2021-08-18 MED ORDER — CEFAZOLIN SODIUM-DEXTROSE 2-4 GM/100ML-% IV SOLN
2.0000 g | INTRAVENOUS | Status: AC
  Administered 2021-08-18: 2 g via INTRAVENOUS

## 2021-08-18 MED ORDER — 0.9 % SODIUM CHLORIDE (POUR BTL) OPTIME
TOPICAL | Status: DC | PRN
Start: 2021-08-18 — End: 2021-08-18
  Administered 2021-08-18: 1000 mL

## 2021-08-18 MED ORDER — FENTANYL CITRATE (PF) 250 MCG/5ML IJ SOLN
INTRAMUSCULAR | Status: DC | PRN
  Administered 2021-08-18 (×2): 50 ug via INTRAVENOUS

## 2021-08-18 MED ORDER — ACETAMINOPHEN 500 MG PO TABS: 1000.0000 mg | ORAL_TABLET | ORAL | Status: DC

## 2021-08-18 MED ORDER — ATORVASTATIN CALCIUM 40 MG PO TABS
40.0000 mg | ORAL_TABLET | Freq: Every day | ORAL | Status: DC
  Administered 2021-08-19: 40 mg via ORAL
  Filled 2021-08-18: qty 1

## 2021-08-18 MED ORDER — BUPIVACAINE-EPINEPHRINE 0.25% -1:200000 IJ SOLN
INTRAMUSCULAR | Status: DC | PRN
  Administered 2021-08-18: 16 mL

## 2021-08-18 MED ORDER — OXYCODONE HCL 5 MG PO TABS: 5.0000 mg | ORAL_TABLET | Freq: Once | ORAL | Status: DC | PRN

## 2021-08-18 MED ORDER — EPHEDRINE SULFATE-NACL 50-0.9 MG/10ML-% IV SOSY
PREFILLED_SYRINGE | INTRAVENOUS | Status: DC | PRN
Start: 2021-08-18 — End: 2021-08-18
  Administered 2021-08-18: 10 mg via INTRAVENOUS

## 2021-08-18 MED ORDER — BUPIVACAINE-EPINEPHRINE (PF) 0.25% -1:200000 IJ SOLN
INTRAMUSCULAR | Status: AC
  Filled 2021-08-18: qty 30

## 2021-08-18 MED ORDER — ONDANSETRON 4 MG PO TBDP: 4.0000 mg | ORAL_TABLET | Freq: Four times a day (QID) | ORAL | Status: DC | PRN

## 2021-08-18 MED ORDER — PANTOPRAZOLE SODIUM 40 MG PO TBEC
40.0000 mg | DELAYED_RELEASE_TABLET | Freq: Every day | ORAL | Status: DC
  Administered 2021-08-19: 40 mg via ORAL
  Filled 2021-08-18: qty 1

## 2021-08-18 MED ORDER — POTASSIUM CHLORIDE CRYS ER 20 MEQ PO TBCR
20.0000 meq | EXTENDED_RELEASE_TABLET | Freq: Every day | ORAL | Status: DC
  Administered 2021-08-18 – 2021-08-19 (×2): 20 meq via ORAL
  Filled 2021-08-18 (×2): qty 1

## 2021-08-18 MED ORDER — TRAMADOL HCL 50 MG PO TABS: 50.0000 mg | ORAL_TABLET | Freq: Four times a day (QID) | ORAL | Status: DC | PRN

## 2021-08-18 MED ORDER — CHLORHEXIDINE GLUCONATE CLOTH 2 % EX PADS: 6.0000 | MEDICATED_PAD | Freq: Once | CUTANEOUS | Status: DC

## 2021-08-18 MED ORDER — CEFAZOLIN SODIUM-DEXTROSE 2-4 GM/100ML-% IV SOLN
INTRAVENOUS | Status: AC
  Filled 2021-08-18: qty 100

## 2021-08-18 MED ORDER — OXYCODONE-ACETAMINOPHEN 5-325 MG PO TABS
1.0000 | ORAL_TABLET | ORAL | Status: DC | PRN
  Administered 2021-08-18: 2 via ORAL
  Filled 2021-08-18: qty 2

## 2021-08-18 MED ORDER — ONDANSETRON HCL 4 MG/2ML IJ SOLN
INTRAMUSCULAR | Status: DC | PRN
Start: 2021-08-18 — End: 2021-08-18
  Administered 2021-08-18: 4 mg via INTRAVENOUS

## 2021-08-18 MED ORDER — CALCIUM CARBONATE ANTACID 500 MG PO CHEW: 1.0000 | CHEWABLE_TABLET | Freq: Every day | ORAL | Status: DC | PRN

## 2021-08-18 MED ORDER — METOPROLOL SUCCINATE ER 25 MG PO TB24
25.0000 mg | ORAL_TABLET | Freq: Every day | ORAL | Status: DC
  Administered 2021-08-18 – 2021-08-19 (×2): 25 mg via ORAL
  Filled 2021-08-18 (×2): qty 1

## 2021-08-18 MED ORDER — FENTANYL CITRATE (PF) 100 MCG/2ML IJ SOLN: 25.0000 ug | INTRAMUSCULAR | Status: DC | PRN

## 2021-08-18 MED ORDER — FENTANYL CITRATE (PF) 250 MCG/5ML IJ SOLN
INTRAMUSCULAR | Status: AC
  Filled 2021-08-18: qty 5

## 2021-08-18 MED ORDER — DEXTROSE-NACL 5-0.9 % IV SOLN: INTRAVENOUS | Status: DC

## 2021-08-18 MED ORDER — ORAL CARE MOUTH RINSE: 15.0000 mL | Freq: Once | OROMUCOSAL | Status: AC

## 2021-08-18 MED ORDER — POLYVINYL ALCOHOL 1.4 % OP SOLN
1.0000 [drp] | Freq: Four times a day (QID) | OPHTHALMIC | Status: DC | PRN
  Filled 2021-08-18: qty 15

## 2021-08-18 MED ORDER — DEXAMETHASONE SODIUM PHOSPHATE 10 MG/ML IJ SOLN
INTRAMUSCULAR | Status: DC | PRN
Start: 2021-08-18 — End: 2021-08-18
  Administered 2021-08-18: 10 mg via INTRAVENOUS

## 2021-08-18 MED ORDER — HYPROMELLOSE (GONIOSCOPIC) 2.5 % OP SOLN
1.0000 [drp] | Freq: Four times a day (QID) | OPHTHALMIC | Status: DC | PRN
  Filled 2021-08-18: qty 15

## 2021-08-18 MED ORDER — PROPOFOL 10 MG/ML IV BOLUS
INTRAVENOUS | Status: DC | PRN
  Administered 2021-08-18: 20 mg via INTRAVENOUS
  Administered 2021-08-18: 80 mg via INTRAVENOUS

## 2021-08-18 SURGICAL SUPPLY — 38 items
ADH SKN CLS APL DERMABOND .7 (GAUZE/BANDAGES/DRESSINGS) ×1
APL PRP STRL LF DISP 70% ISPRP (MISCELLANEOUS) ×1
APPLIER CLIP 9.375 MED OPEN (MISCELLANEOUS) ×2
APR CLP MED 9.3 20 MLT OPN (MISCELLANEOUS) ×1
BAG COUNTER SPONGE SURGICOUNT (BAG) ×3 IMPLANT
BAG SPNG CNTER NS LX DISP (BAG) ×1
BINDER BREAST XLRG (GAUZE/BANDAGES/DRESSINGS) ×1 IMPLANT
CANISTER SUCT 3000ML PPV (MISCELLANEOUS) IMPLANT
CHLORAPREP W/TINT 26 (MISCELLANEOUS) ×3 IMPLANT
CLIP APPLIE 9.375 MED OPEN (MISCELLANEOUS) IMPLANT
COVER PROBE W GEL 5X96 (DRAPES) ×3 IMPLANT
COVER SURGICAL LIGHT HANDLE (MISCELLANEOUS) ×3 IMPLANT
DERMABOND ADVANCED (GAUZE/BANDAGES/DRESSINGS) ×1
DERMABOND ADVANCED .7 DNX12 (GAUZE/BANDAGES/DRESSINGS) ×2 IMPLANT
DEVICE DUBIN SPECIMEN MAMMOGRA (MISCELLANEOUS) ×3 IMPLANT
DRAPE CHEST BREAST 15X10 FENES (DRAPES) ×3 IMPLANT
ELECT CAUTERY BLADE 6.4 (BLADE) ×3 IMPLANT
ELECT REM PT RETURN 9FT ADLT (ELECTROSURGICAL) ×2
ELECTRODE REM PT RTRN 9FT ADLT (ELECTROSURGICAL) ×2 IMPLANT
GLOVE SRG 8 PF TXTR STRL LF DI (GLOVE) ×2 IMPLANT
GLOVE SURG ENC MOIS LTX SZ8 (GLOVE) ×3 IMPLANT
GLOVE SURG UNDER POLY LF SZ8 (GLOVE) ×2
GOWN STRL REUS W/ TWL LRG LVL3 (GOWN DISPOSABLE) ×2 IMPLANT
GOWN STRL REUS W/ TWL XL LVL3 (GOWN DISPOSABLE) ×2 IMPLANT
GOWN STRL REUS W/TWL LRG LVL3 (GOWN DISPOSABLE) ×2
GOWN STRL REUS W/TWL XL LVL3 (GOWN DISPOSABLE) ×2
KIT BASIN OR (CUSTOM PROCEDURE TRAY) ×3 IMPLANT
KIT MARKER MARGIN INK (KITS) ×1 IMPLANT
NDL HYPO 25GX1X1/2 BEV (NEEDLE) IMPLANT
NEEDLE HYPO 25GX1X1/2 BEV (NEEDLE) ×2 IMPLANT
NS IRRIG 1000ML POUR BTL (IV SOLUTION) IMPLANT
PACK GENERAL/GYN (CUSTOM PROCEDURE TRAY) ×3 IMPLANT
SUT MNCRL AB 4-0 PS2 18 (SUTURE) ×3 IMPLANT
SUT VIC AB 2-0 SH 27 (SUTURE) ×2
SUT VIC AB 2-0 SH 27XBRD (SUTURE) IMPLANT
SUT VIC AB 3-0 SH 8-18 (SUTURE) ×3 IMPLANT
SYR CONTROL 10ML LL (SYRINGE) IMPLANT
TOWEL GREEN STERILE FF (TOWEL DISPOSABLE) IMPLANT

## 2021-08-18 NOTE — Anesthesia Procedure Notes (Signed)
Procedure Name: LMA Insertion Date/Time: 08/18/2021 3:58 PM Performed by: Lance Coon, CRNA Pre-anesthesia Checklist: Patient identified, Emergency Drugs available, Suction available, Patient being monitored and Timeout performed Patient Re-evaluated:Patient Re-evaluated prior to induction Oxygen Delivery Method: Circle system utilized Preoxygenation: Pre-oxygenation with 100% oxygen Induction Type: IV induction LMA: LMA inserted LMA Size: 4.0 Number of attempts: 1 Placement Confirmation: positive ETCO2 and breath sounds checked- equal and bilateral Tube secured with: Tape Dental Injury: Teeth and Oropharynx as per pre-operative assessment

## 2021-08-18 NOTE — Transfer of Care (Signed)
Immediate Anesthesia Transfer of Care Note  Patient: [name]  Procedure(s) Performed: RIGHT BREAST LUMPECTOMY WITH RADIOACTIVE SEED LOCALIZATION (Right: Breast)  Patient Location: PACU  Anesthesia Type:General  Level of Consciousness: drowsy and patient cooperative  Airway & Oxygen Therapy: Patient Spontanous Breathing  Post-op Assessment: Report given to RN and Post -op Vital signs reviewed and stable  Post vital signs: Reviewed and stable  Last Vitals:  Vitals Value Taken Time  BP 143/57 08/18/21 1643  Temp    Pulse 87 08/18/21 1643  Resp 35 08/18/21 1643  SpO2 99 % 08/18/21 1643  Vitals shown include unvalidated device data.  Last Pain:  Vitals:   08/18/21 1438  PainSc: 0-No pain      Patients Stated Pain Goal: 0 (30/16/01 0932)  Complications: No notable events documented.

## 2021-08-18 NOTE — Op Note (Signed)
Preoperative diagnosis: Stage I right breast cancer upper-outer quadrant      Postoperative diagnosis: Same      Procedure: right  breast seed localized lumpectomy    Surgeon: Erroll Luna M.D.    Anesthesia: LMA with local   EBL: 20 cc   Specimen: right breast mass   Drains: None    Indications for procedure: Patient presents for treatment of her  right breast cancer. She has opted for breast conservation after lengthy discussion of treatment options to include breast conservation surgery and mastectomy and reconstruction. Risks, benefits and alternatives discussed with the patient.The procedure has been discussed with the patient. Alternatives to surgery have been discussed with the patient. Risks of surgery include bleeding, Infection, Seroma formation, death, and the need for further surgery. The patient understands and wishes to proceed.         Description of procedure: Patient underwent placement of right  breast seed in  radiology earlier in the week. She presents to the holding area and questions are answered. Patient taken back to operating room and placed supine on the operating room table. Patient received 2 g of Ancef. After induction of LMA anesthesia right breast was prepped and draped in a sterile fashion.  Time out performed.  Curvilinear incision made in the upper outer quadrant  and dissection was carried around to excise all tissue around both the clip and seen. Radiograph showed the mass with gross negative margins. Both seed  and clip were in the specimen. Specimen sent to pathology.    Wound  was irrigated and closed with 3-0 Vicryl and 4-0 Monocryl. Dermabond applied. All final counts sponge, needle instruments found to be correct at this point. Patient awoke, taken to recovery in satisfactory condition.

## 2021-08-18 NOTE — Interval H&P Note (Signed)
History and Physical Interval Note:  08/18/2021 2:12 PM  [name]  has presented today for surgery, with the diagnosis of RIGHT BREAST MASS.  The various methods of treatment have been discussed with the patient and family. After consideration of risks, benefits and other options for treatment, the patient has consented to  Procedure(s): RIGHT BREAST LUMPECTOMY WITH RADIOACTIVE SEED LOCALIZATION (Right) as a surgical intervention.  The patient's history has been reviewed, patient examined, no change in status, stable for surgery.  I have reviewed the patient's chart and labs.  Questions were answered to the patient's satisfaction.     [place]

## 2021-08-18 NOTE — H&P (Signed)
History of Present Illness: [name] is a 88 y.o. female who is seen today for breast cancer follow-up. She has a long history of breast cancer dating back to her 85s. She has had multiple lumpectomies and treatment in the past. She is now 55 years old but very functional living at friend's home [place]. She was found on her most recent mammogram to have a 4 mm nodule in the right breast upper outer quadrant. Final pathology showed atypical epithelial proliferation and this was concerning for possible recurrent breast cancer. She presents today to discuss treatment options. She lives with her husband who is 80 years old. She is still very active for her age and in relatively good health. She gets around with a walker but has had multiple lumpectomies most recently 2 years ago and is done well. She is with her caregiver today. She has no complaints.  88 y.o. BRCA negative [name] resident    (1) status post bilateral lumpectomies without sentinel lymph node biopsy in August 2007 for a right-sided 7 mm, grade 2, invasive ductal carcinoma, and a left-sided 8 mm, grade 1, invasive ductal carcinoma. Both tumors were ER positive, HER-2/neu negative, both with a low proliferation fraction.  (a) On Arimidex from September 2007 until May 2009, at which time we switched to tamoxifen primarily secondary to concerns regarding osteoporosis, completing five years of antiestrogen September 2011.  (b) Also received Zometa yearly, discontinued after 4 doses in 2011   (2) Status post right breast upper outer quadrant biopsy 09/11/2014 4 a 4.2 cm area of ductal carcinoma in situ, grade 2 or 3, with extravasated mucin, estrogen and progesterone receptor positive   (3) status post right lumpectomy 10/01/2014 for a pT1a pNX, stage IA invasive ductal carcinoma, grade 2, estrogen and progesterone receptor positive, HER-2 negative, with an MIB-1 of 38%. Margins were close but negative   (4) she did not need  adjuvant radiation   (5) opted against anti-estrogens (a) DEXA scan 10/31/2014 shows a T score of -1.8.   (6) the BreastNext gene panel performed at [facility] 09/19/2014 showed no deleterious mutations in ATM, BARD1, BRCA1, BRCA2, BRIP1, CDH1, CHEK2, MRE11A, MUTYH, NBN, NF1, PALB2, PTEN, RAD50, RAD51C, RAD51D, or TP53.   (7) status post left lumpectomy 03/28/2019 for a pT1c pNX, stage IA invasive ductal carcinoma, with negative margins; the tumor was strongly estrogen and progesterone receptor positive, HER-2 not amplified, with an MIB-1 of 15%.  Review of Systems: A complete review of systems was obtained from the patient. I have reviewed this information and discussed as appropriate with the patient. See HPI as well for other ROS.  ROS   Medical History: Past Medical History:  Diagnosis Date   Arrhythmia   Arthritis   GERD (gastroesophageal reflux disease)   History of cancer   Hyperlipidemia   There is no problem list on file for this patient.  Past Surgical History:  Procedure Laterality Date   CHOLECYSTECTOMY   MASTECTOMY PARTIAL / LUMPECTOMY    Allergies  Allergen Reactions   Celecoxib Swelling  Swelling-leg   Current Outpatient Medications on File Prior to Visit  Medication Sig Dispense Refill   atorvastatin (LIPITOR) 40 MG tablet Take 1 tablet (40 mg total) by mouth once daily   FUROsemide (LASIX) 40 MG tablet Take 1 tablet (40 mg total) by mouth once daily   ipratropium (ATROVENT) 0.06 % nasal spray PLACE 2 SPRAYS INTO BOTH NOSTRILS 4 TIMES DAILY.   KLOR-CON M20 20 mEq  ER tablet Take 1 tablet (20 mEq total) by mouth once daily   metoprolol succinate (TOPROL-XL) 25 MG XL tablet Take 1 tablet (25 mg total) by mouth at bedtime   montelukast (SINGULAIR) 10 mg tablet TAKE 1 TABLET BY MOUTH AT NIGHT FOR COUGHING OR WHEEZING   pantoprazole (PROTONIX) 40 MG DR tablet   No current facility-administered medications on file prior to visit.   Family History  Problem  Relation Age of Onset   Breast cancer Mother   Coronary Artery Disease (Blocked arteries around heart) Father    Social History   Tobacco Use  Smoking Status Never  Smokeless Tobacco Never    Social History   Socioeconomic History   Marital status: Married  Tobacco Use   Smoking status: Never   Smokeless tobacco: Never  Substance and Sexual Activity   Alcohol use: Never   Drug use: Never   Objective:   Vitals:  06/19/21 1035  BP: 126/58  Pulse: 81  Temp: 36.7 C (98 F)  SpO2: 91%  Weight: 74.3 kg (163 lb 12.8 oz)  Height: 160 cm ($Remov'5\' 3"'oOaxpZ$ )   Body mass index is 29.02 kg/m.  Physical Exam Constitutional:  Appearance: Normal appearance.  Eyes:  General: No scleral icterus. Pupils: Pupils are equal, round, and reactive to light.  Cardiovascular:  Rate and Rhythm: Normal rate.  Pulmonary:  Effort: Pulmonary effort is normal. No respiratory distress.  Breath sounds: No stridor.  Chest:  Breasts: Right: No swelling, inverted nipple, mass or nipple discharge.  Left: No swelling, inverted nipple, mass or nipple discharge.   Musculoskeletal:  Cervical back: Normal range of motion and neck supple.  Lymphadenopathy:  Upper Body:  Right upper body: No supraclavicular or axillary adenopathy.  Left upper body: No supraclavicular or axillary adenopathy.  Neurological:  Mental Status: She is alert.     Labs, Imaging and Diagnostic Testing:  Diagnosis Breast, right, needle core biopsy, Mass 10:00 o'clock 5cmfn (0.4cm) - ATYPICAL EPITHELIAL PROLIFERATION - SEE COMMENT Microscopic Comment The biopsy shows a small focus of an atypical epithelial proliferation. Immunohistochemistry (cytokeratin 5/6, ER, SMM, p63 and calponin) was performed to better characterize this epithelial proliferation; however, the focus of interest was cut away. Overall, the features are concerning for malignant process and an excisional biopsy is recommended. Thressa Sheller MD Pathologist,  Electronic Signature (Case signed 06/03/2021) Specimen  Mammogram reveals a 4 mm nodule right breast upper outer quadrant. There is also lateral postsurgical changes noted.  Assessment and Plan:  Diagnoses and all orders for this visit:  Mass of upper outer quadrant of right breast  History of breast cancer    Etiology of mass unclear. There is concern this could be a new and/or recurrent breast cancer. I discussed this with her caregiver today. We discussed observation versus surgical excision. Unfortunately, the only real option is surgical excision for definitive diagnosis. Core biopsy was not revealing except for being atypical and concerning. Pros and cons of surgery were discussed with her especially her advanced age. I discussed doing nothing with her as well given her advanced age. She is very motivated to have the area removed and actually has done well with her lumpectomies dating back to 2 years ago when she was 88 years old. We discussed pros and cons. I offered her admission afterwards to the hospital for 24 hours if she needs it. She is very adamant about going home since she is at [facility] and does have assistance there and wants to be with  her husband who has a recent hip fracture. She wishes to proceed with right breast seed lumpectomy which given her overall health is a reasonable option for her since core biopsy is not definitive and that no other treatment option could be done with any confidence.Hold DOAC 2 days prior to surgery  The procedure has been discussed with the patient. Alternatives to surgery have been discussed with the patient. Risks of surgery include bleeding, Infection, Seroma formation, death, and the need for further surgery. The patient understands and wishes to proceed.

## 2021-08-18 NOTE — Anesthesia Postprocedure Evaluation (Signed)
Anesthesia Post Note  Patient: [name]  Procedure(s) Performed: RIGHT BREAST LUMPECTOMY WITH RADIOACTIVE SEED LOCALIZATION (Right: Breast)     Patient location during evaluation: PACU Anesthesia Type: General Level of consciousness: sedated Pain management: pain level controlled Vital Signs Assessment: post-procedure vital signs reviewed and stable Respiratory status: spontaneous breathing and respiratory function stable Cardiovascular status: stable Postop Assessment: no apparent nausea or vomiting Anesthetic complications: no   No notable events documented.  Last Vitals:  Vitals:   08/18/21 1715 08/18/21 1730  BP: (!) 148/56 (!) 143/53  Pulse: 80 80  Resp: 13 12  Temp:    SpO2: 100% 98%    Last Pain:  Vitals:   08/18/21 1715  PainSc: 0-No pain                 Kimley Apsey DANIEL

## 2021-08-19 ENCOUNTER — Encounter (HOSPITAL_COMMUNITY): Payer: Self-pay | Admitting: Surgery

## 2021-08-19 ENCOUNTER — Other Ambulatory Visit: Payer: Self-pay

## 2021-08-19 DIAGNOSIS — Z79899 Other long term (current) drug therapy: Secondary | ICD-10-CM | POA: Diagnosis not present

## 2021-08-19 DIAGNOSIS — Z170 Estrogen receptor positive status [ER+]: Secondary | ICD-10-CM | POA: Diagnosis not present

## 2021-08-19 DIAGNOSIS — C50411 Malignant neoplasm of upper-outer quadrant of right female breast: Secondary | ICD-10-CM | POA: Diagnosis not present

## 2021-08-19 MED ORDER — HYDROCODONE-ACETAMINOPHEN 5-325 MG PO TABS: 1.0000 | ORAL_TABLET | Freq: Four times a day (QID) | ORAL | 0 refills | Status: DC | PRN

## 2021-08-19 NOTE — Discharge Summary (Signed)
Physician Discharge Summary  Patient ID: AMRITA RADU MRN: [id number] DOB/AGE: [birth year] 86 y.o.  Admit date: 08/18/2021 Discharge date: 08/19/2021  Admission Diagnoses:right breast cancer stage 1 upper outer quadrant  Discharge Diagnoses:  Principal Problem:   Breast cancer, stage 1, estrogen receptor positive, right [facility])   Discharged Condition: good  Hospital Course: Pt did well post operatively. She tolerated her diet, had good pain controlled and ambulated.      Treatments: surgery: right breast lumpectomy   Discharge Exam: Blood pressure (!) 123/52, pulse 85, temperature 98 F (36.7 C), resp. rate 18, height 5\' 2"  (1.575 m), weight 74 kg, SpO2 100 %. General appearance: alert and cooperative Resp: clear to auscultation bilaterally Cardio: a fib  Incision/Wound:right breast incision CDI   Disposition: Discharge disposition: 01-Home or Self Care       Discharge Instructions     Diet - low sodium heart healthy   Complete by: As directed    Increase activity slowly   Complete by: As directed       Allergies as of 08/19/2021       Reactions   Celecoxib Swelling   Swelling-leg        Medication List     TAKE these medications    atorvastatin 40 MG tablet Commonly known as: LIPITOR Take 1 tablet (40 mg total) by mouth daily.   calcium carbonate 500 MG chewable tablet Commonly known as: TUMS - dosed in mg elemental calcium Chew 1 tablet by mouth daily as needed for indigestion or heartburn.   Eliquis 5 MG Tabs tablet Generic drug: apixaban TAKE 1 TABLET BY MOUTH TWICE A DAY   furosemide 40 MG tablet Commonly known as: LASIX TAKE 1 TABLET BY MOUTH EVERY DAY   HYDROcodone-acetaminophen 5-325 MG tablet Commonly known as: NORCO/VICODIN Take 1 tablet by mouth every 6 (six) hours as needed for moderate pain.   hydroxypropyl methylcellulose / hypromellose 2.5 % ophthalmic solution Commonly known as: ISOPTO TEARS / GONIOVISC Place 1 drop into  both eyes in the morning and at bedtime.   ipratropium 0.06 % nasal spray Commonly known as: ATROVENT Place 2 sprays into both nostrils 4 (four) times daily.   ketoconazole 2 % cream Commonly known as: NIZORAL Apply 1 application topically 2 (two) times daily. What changed:  when to take this reasons to take this   Klor-Con M20 20 MEQ tablet Generic drug: potassium chloride SA TAKE 1 TABLET BY MOUTH EVERY DAY   metoprolol succinate 25 MG 24 hr tablet Commonly known as: TOPROL-XL TAKE 1 TABLET BY MOUTH EVERYDAY AT BEDTIME   montelukast 10 MG tablet Commonly known as: SINGULAIR TAKE ONE TABLET ONCE DAILY AT NIGHT FOR COUGHING OR WHEEZING.   pantoprazole 40 MG tablet Commonly known as: PROTONIX Take 1 tablet (40 mg total) by mouth daily.   PreserVision AREDS 2 Caps Take 1 capsule by mouth 2 (two) times daily.         Signed: Joyice Faster Paislynn Hegstrom md  08/19/2021, 8:25 AM

## 2021-08-19 NOTE — Progress Notes (Signed)
err

## 2021-08-19 NOTE — Discharge Instructions (Addendum)
[facility] Phone Number [phone]  BREAST BIOPSY/ PARTIAL MASTECTOMY: POST OP INSTRUCTIONS  Always review your discharge instruction sheet given to you by the facility where your surgery was performed.  IF YOU HAVE DISABILITY OR FAMILY LEAVE FORMS, YOU MUST BRING THEM TO THE OFFICE FOR PROCESSING.  DO NOT GIVE THEM TO YOUR DOCTOR.  A prescription for pain medication may be given to you upon discharge.  Take your pain medication as prescribed, if needed.  If narcotic pain medicine is not needed, then you may take acetaminophen (Tylenol) or ibuprofen (Advil) as needed. Take your usually prescribed medications unless otherwise directed If you need a refill on your pain medication, please contact your pharmacy.  They will contact our office to request authorization.  Prescriptions will not be filled after 5pm or on week-ends. You should eat very light the first 24 hours after surgery, such as soup, crackers, pudding, etc.  Resume your normal diet the day after surgery. Most patients will experience some swelling and bruising in the breast.  Ice packs and a good support bra will help.  Swelling and bruising can take several days to resolve.  It is common to experience some constipation if taking pain medication after surgery.  Increasing fluid intake and taking a stool softener will usually help or prevent this problem from occurring.  A mild laxative (Milk of Magnesia or Miralax) should be taken according to package directions if there are no bowel movements after 48 hours. Unless discharge instructions indicate otherwise, you may remove your bandages 24-48 hours after surgery, and you may shower at that time.  You may have steri-strips (small skin tapes) in place directly over the incision.  These strips should be left on the skin for 7-10 days.  If your surgeon used skin glue on the incision, you may shower in 24 hours.  The glue will flake off over the next 2-3 weeks.  Any  sutures or staples will be removed at the office during your follow-up visit. ACTIVITIES:  You may resume regular daily activities (gradually increasing) beginning the next day.  Wearing a good support bra or sports bra minimizes pain and swelling.  You may have sexual intercourse when it is comfortable. You may drive when you no longer are taking prescription pain medication, you can comfortably wear a seatbelt, and you can safely maneuver your car and apply brakes. RETURN TO WORK:  ______________________________________________________________________________________ Dennis Bast should see your doctor in the office for a follow-up appointment approximately two weeks after your surgery.  Your doctors nurse will typically make your follow-up appointment when she calls you with your pathology report.  Expect your pathology report 2-3 business days after your surgery.  You may call to check if you do not hear from [place] after three days. OTHER INSTRUCTIONS: _______________________________________________________________________________________________ _____________________________________________________________________________________________________________________________________ _____________________________________________________________________________________________________________________________________ _____________________________________________________________________________________________________________________________________  WHEN TO CALL YOUR DOCTOR: Fever over 101.0 Nausea and/or vomiting. Extreme swelling or bruising. Continued bleeding from incision. Increased pain, redness, or drainage from the incision.  The clinic staff is available to answer your questions during regular business hours.  Please dont hesitate to call and ask to speak to one of the nurses for clinical concerns.  If you have a medical emergency, go to the nearest emergency room or call 911.  A surgeon from [facility] is always on call at the hospital.  For further questions, please visit [email or web address]        RESTART ELOQUIS  Thursday

## 2021-08-19 NOTE — Progress Notes (Signed)
Explained discharge instructions to patient as well as incisional care. She verbalized understanding the instructions. Removed IV and assisted with getting dressed. Transporting down to the DC lounge.

## 2021-08-24 ENCOUNTER — Encounter: Payer: Self-pay | Admitting: Surgery

## 2021-08-24 ENCOUNTER — Telehealth: Payer: Self-pay | Admitting: Hematology and Oncology

## 2021-08-24 NOTE — Telephone Encounter (Signed)
Rescheduled appointment per provider. Patient aware.  

## 2021-08-26 LAB — SURGICAL PATHOLOGY

## 2021-09-14 ENCOUNTER — Other Ambulatory Visit: Payer: Self-pay | Admitting: *Deleted

## 2021-09-14 DIAGNOSIS — C50412 Malignant neoplasm of upper-outer quadrant of left female breast: Secondary | ICD-10-CM

## 2021-09-15 ENCOUNTER — Encounter: Payer: Self-pay | Admitting: Hematology and Oncology

## 2021-09-15 ENCOUNTER — Inpatient Hospital Stay (HOSPITAL_BASED_OUTPATIENT_CLINIC_OR_DEPARTMENT_OTHER): Payer: MEDICARE | Admitting: Hematology and Oncology

## 2021-09-15 ENCOUNTER — Other Ambulatory Visit: Payer: Self-pay | Admitting: Hematology and Oncology

## 2021-09-15 ENCOUNTER — Other Ambulatory Visit: Payer: Self-pay

## 2021-09-15 ENCOUNTER — Inpatient Hospital Stay: Payer: MEDICARE | Attending: Hematology and Oncology

## 2021-09-15 VITALS — BP 156/44 | HR 74 | Temp 97.5°F | Resp 18 | Wt 165.7 lb

## 2021-09-15 DIAGNOSIS — Z8041 Family history of malignant neoplasm of ovary: Secondary | ICD-10-CM | POA: Diagnosis not present

## 2021-09-15 DIAGNOSIS — I4891 Unspecified atrial fibrillation: Secondary | ICD-10-CM | POA: Insufficient documentation

## 2021-09-15 DIAGNOSIS — C50412 Malignant neoplasm of upper-outer quadrant of left female breast: Secondary | ICD-10-CM

## 2021-09-15 DIAGNOSIS — C50411 Malignant neoplasm of upper-outer quadrant of right female breast: Secondary | ICD-10-CM | POA: Insufficient documentation

## 2021-09-15 DIAGNOSIS — Z803 Family history of malignant neoplasm of breast: Secondary | ICD-10-CM | POA: Diagnosis not present

## 2021-09-15 DIAGNOSIS — Z8249 Family history of ischemic heart disease and other diseases of the circulatory system: Secondary | ICD-10-CM | POA: Insufficient documentation

## 2021-09-15 DIAGNOSIS — Z170 Estrogen receptor positive status [ER+]: Secondary | ICD-10-CM

## 2021-09-15 DIAGNOSIS — I10 Essential (primary) hypertension: Secondary | ICD-10-CM | POA: Diagnosis not present

## 2021-09-15 DIAGNOSIS — Z7901 Long term (current) use of anticoagulants: Secondary | ICD-10-CM | POA: Diagnosis not present

## 2021-09-15 DIAGNOSIS — Z9049 Acquired absence of other specified parts of digestive tract: Secondary | ICD-10-CM | POA: Diagnosis not present

## 2021-09-15 LAB — CBC WITH DIFFERENTIAL (CANCER CENTER ONLY)
Abs Immature Granulocytes: 0.01 10*3/uL (ref 0.00–0.07)
Basophils Absolute: 0 10*3/uL (ref 0.0–0.1)
Basophils Relative: 1 %
Eosinophils Absolute: 0 10*3/uL (ref 0.0–0.5)
Eosinophils Relative: 1 %
HCT: 34.9 % — ABNORMAL LOW (ref 36.0–46.0)
Hemoglobin: 11.5 g/dL — ABNORMAL LOW (ref 12.0–15.0)
Immature Granulocytes: 0 %
Lymphocytes Relative: 16 %
Lymphs Abs: 1 10*3/uL (ref 0.7–4.0)
MCH: 30.7 pg (ref 26.0–34.0)
MCHC: 33 g/dL (ref 30.0–36.0)
MCV: 93.3 fL (ref 80.0–100.0)
Monocytes Absolute: 0.5 10*3/uL (ref 0.1–1.0)
Monocytes Relative: 8 %
Neutro Abs: 4.8 10*3/uL (ref 1.7–7.7)
Neutrophils Relative %: 74 %
Platelet Count: 172 10*3/uL (ref 150–400)
RBC: 3.74 MIL/uL — ABNORMAL LOW (ref 3.87–5.11)
RDW: 13.7 % (ref 11.5–15.5)
WBC Count: 6.4 10*3/uL (ref 4.0–10.5)
nRBC: 0 % (ref 0.0–0.2)

## 2021-09-15 LAB — CMP (CANCER CENTER ONLY)
ALT: 12 U/L (ref 0–44)
AST: 14 U/L — ABNORMAL LOW (ref 15–41)
Albumin: 3.8 g/dL (ref 3.5–5.0)
Alkaline Phosphatase: 108 U/L (ref 38–126)
Anion gap: 6 (ref 5–15)
BUN: 40 mg/dL — ABNORMAL HIGH (ref 8–23)
CO2: 29 mmol/L (ref 22–32)
Calcium: 9 mg/dL (ref 8.9–10.3)
Chloride: 108 mmol/L (ref 98–111)
Creatinine: 1.16 mg/dL — ABNORMAL HIGH (ref 0.44–1.00)
GFR, Estimated: 43 mL/min — ABNORMAL LOW (ref >60)
Glucose, Bld: 102 mg/dL — ABNORMAL HIGH (ref 70–99)
Potassium: 4 mmol/L (ref 3.5–5.1)
Sodium: 143 mmol/L (ref 135–145)
Total Bilirubin: 0.4 mg/dL (ref 0.3–1.2)
Total Protein: 6.5 g/dL (ref 6.5–8.1)

## 2021-09-15 MED ORDER — TAMOXIFEN CITRATE 20 MG PO TABS
20.0000 mg | ORAL_TABLET | Freq: Every day | ORAL | 3 refills | Status: DC
Start: 2021-09-15 — End: 2021-09-15

## 2021-09-15 NOTE — Progress Notes (Signed)
[place]  [phone] Fax:([phone]) [phone]     ID: [name] DOB: [birth year]  MR#: [id number]  PQZ#:[id number]  Patient Care Team: Virgie Dad, MD as PCP - General (Internal Medicine) Sanda Klein, MD as PCP - Cardiology (Cardiology) Erroll Luna, MD as Consulting Physician (General Surgery) Arloa Koh, MD (Inactive) as Consulting Physician (Radiation Oncology) [name], RN as Registered Nurse [name], RN as Registered Nurse [name], MD as Consulting Physician (Gastroenterology) Benay Pike, MD as Consulting Physician (Hematology and Oncology)  CHIEF COMPLAINT: Estrogen receptor positive breast cancer  CURRENT TREATMENT: observation  INTERVAL HISTORY:  [name] returns today for for follow-up of her more recent left breast cancer with her friend today.   Since last visit, she had an abnormal mammogram and right lumpectomy on 08/18/2021. Pathology showed grade 2 invasive ductal carcinoma, low to intermediate grade DCIS, resection margins negative, prognostic showed ER 95% strong intensity, PR 95% strong intensity and HER2 negative.,  KI of 1%. She has recovered well from her lumpectomy and is here to discuss any further recommendations.  She is very active 88 year old, lives in an independent living with her 44 year old husband.  She works in [employer], walks to [employer] frequently and tries to stay as active as she can.  She is very hard of hearing and had her friend who is a retired Therapist, sports today. Rest of the pertinent 10 point review of systems reviewed and negative.  REVIEW OF SYSTEMS:  A detailed review of systems today was benign.   COVID 19 VACCINATION STATUS: Moderna x4, last 12/2020   BREAST CANCER HISTORY: From the earlier summary note:  I saw [name] for a history of bilateral breast cancers, treated with bilateral lumpectomies and anti-estrogens for 5 years. She did not receive radiation  treatments. She was last seen here in 2011.  She had her annual screening mammography at [facility] 08/27/2013, and this showed the breast density to be category B. There was an area of new grouped heterogeneous calcifications in the right breast at the 11:00 middle depth. A focal asymmetric density anterior to that area was seen in one view only. Additional views were recommended, but if they were performed I do not have those records. There appears to have been mammography on 03/05/2014, but again I do not have those records.  On 08/30/2014, bilateral diagnostic mammography with tomosynthesis this was obtained. The calcifications at the 11:00 position in the right breast were increased in number. There were no other significant findings. Biopsy of the area in question to 20 11/06/2014 showed ([id number]) high-grade ductal carcinoma in situ, with mucin extravasation.no definitive invasive component was seen however. This high-grade noninvasive tumor was estrogen receptor positive at 100%, progesterone receptor positive at 44%, both with strong staining intensity.  Her subsequent history is as detailed below   PAST MEDICAL HISTORY: Past Medical History:  Diagnosis Date   Allergic rhinitis, seasonal    Breast cancer of upper-outer quadrant of right female breast ([place]) 09/13/2014   Chronic venous insufficiency    Dermatophytosis of nail    Dysrhythmia    H/o Atrial fibrillation   Edema leg    Family history of malignant neoplasm of breast    Family history of malignant neoplasm of ovary    Goiter    right thyroid nodule    HTN (hypertension) 08/13/2017   HX: breast cancer    bilateral   Menopausal syndrome    Osteoarthritis  Osteoporosis    Squamous cell skin cancer    right lower leg   Wears glasses    Wears hearing aid    both ears    PAST SURGICAL HISTORY: Past Surgical History:  Procedure Laterality Date   bilateral lumpectomies for breast cancer  Aug. 2007   BREAST LUMPECTOMY  WITH RADIOACTIVE SEED LOCALIZATION Right 10/01/2014   Procedure: BREAST LUMPECTOMY WITH RADIOACTIVE SEED LOCALIZATION;  Surgeon: Erroll Luna, MD;  Location: [place];  Service: General;  Laterality: Right;   BREAST LUMPECTOMY WITH RADIOACTIVE SEED LOCALIZATION Left 03/22/2019   Procedure: LEFT BREAST LUMPECTOMY WITH RADIOACTIVE SEED LOCALIZATION;  Surgeon: Erroll Luna, MD;  Location: [place];  Service: General;  Laterality: Left;   BREAST LUMPECTOMY WITH RADIOACTIVE SEED LOCALIZATION Right 08/18/2021   Procedure: RIGHT BREAST LUMPECTOMY WITH RADIOACTIVE SEED LOCALIZATION;  Surgeon: Erroll Luna, MD;  Location: [facility] [facility];  Service: General;  Laterality: Right;   CATARACT EXTRACTION     CHOLECYSTECTOMY N/A 08/13/2017   Procedure: LAPAROSCOPIC CHOLECYSTECTOMY WITH INTRAOPERATIVE CHOLANGIOGRAM;  Surgeon: Excell Seltzer, MD;  Location: [facility];  Service: General;  Laterality: N/A;   DILATION AND CURETTAGE OF UTERUS     ESOPHAGOGASTRODUODENOSCOPY  07/26/2011   Procedure: ESOPHAGOGASTRODUODENOSCOPY (EGD);  Surgeon: Jeryl Columbia, MD;  Location: [facility];  Service: Endoscopy;  Laterality: N/A;   history of lumbar compression fracture     left eardum sx  1983   left knee arthroscopic surgery     no screening colonoscopy     s/p bilateral lumpectomies     SAVORY DILATION  07/26/2011   Procedure: SAVORY DILATION;  Surgeon: Jeryl Columbia, MD;  Location: [facility];  Service: Endoscopy;  Laterality: N/A;   status post resection squamous cell cancer right lower leg     TONSILLECTOMY     UMBILICAL HERNIA REPAIR      FAMILY HISTORY Family History  Problem Relation Age of Onset   Cancer Mother 72       breast   Heart attack Father    Heart attack Brother    Heart attack Brother    Cancer Maternal Aunt 52       breast   Coronary artery disease Other    Cancer Other        ovarian; daughter of mat aunt w/ BC in 54s   Heart disease Brother    Cancer Maternal Aunt 60       breast   the patient's father died at age 37 from a heart attack. The patient's mother died at age 48. The patient's mother was one of 68 sisters, 9 siblings. 2 of the sisters had breast cancer, postmenopausal. [name] self had 2 half-brothers and 1 full brother as well as one half-sister.There is no history of ovarian cancer in the familyexcept as noted.   GYNECOLOGIC HISTORY:  No LMP recorded. Patient is postmenopausal. Menarche age 78, the patient is GX P0. 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 Her husband [name] is a former Programme researcher, broadcasting/film/video. They currently reside at friends home [place], with no pets.    ADVANCED DIRECTIVES: in place   HEALTH MAINTENANCE: Social History   Tobacco Use   Smoking status: Never   Smokeless tobacco: Never  Vaping Use   Vaping Use: Never used  Substance Use Topics   Alcohol use: No   Drug  use: No     Colonoscopy:  PAP:  Bone density:  Lipid panel:  Allergies  Allergen Reactions   Celecoxib Swelling    Swelling-leg    Current Outpatient Medications  Medication Sig Dispense Refill   tamoxifen (NOLVADEX) 20 MG tablet Take 1 tablet (20 mg total) by mouth daily. 30 tablet 3   atorvastatin (LIPITOR) 40 MG tablet Take 1 tablet (40 mg total) by mouth daily. 90 tablet 1   calcium carbonate (TUMS - DOSED IN MG ELEMENTAL CALCIUM) 500 MG chewable tablet Chew 1 tablet by mouth daily as needed for indigestion or heartburn.     ELIQUIS 5 MG TABS tablet TAKE 1 TABLET BY MOUTH TWICE A DAY 180 tablet 1   furosemide (LASIX) 40 MG tablet TAKE 1 TABLET BY MOUTH EVERY DAY 90 tablet 3   HYDROcodone-acetaminophen (NORCO/VICODIN) 5-325 MG tablet Take 1 tablet by mouth every 6 (six) hours as needed for moderate pain. 15 tablet 0   hydroxypropyl methylcellulose / hypromellose (ISOPTO TEARS / GONIOVISC) 2.5 % ophthalmic  solution Place 1 drop into both eyes in the morning and at bedtime.     ipratropium (ATROVENT) 0.06 % nasal spray Place 2 sprays into both nostrils 4 (four) times daily. 15 mL 5   ketoconazole (NIZORAL) 2 % cream Apply 1 application topically 2 (two) times daily. (Patient taking differently: Apply 1 application topically 2 (two) times daily as needed for irritation.) 15 g 2   metoprolol succinate (TOPROL-XL) 25 MG 24 hr tablet TAKE 1 TABLET BY MOUTH EVERYDAY AT BEDTIME 90 tablet 1   montelukast (SINGULAIR) 10 MG tablet TAKE ONE TABLET ONCE DAILY AT NIGHT FOR COUGHING OR WHEEZING. 90 tablet 3   Multiple Vitamins-Minerals (PRESERVISION AREDS 2) CAPS Take 1 capsule by mouth 2 (two) times daily.      pantoprazole (PROTONIX) 40 MG tablet Take 1 tablet (40 mg total) by mouth daily. 90 tablet 1   potassium chloride SA (KLOR-CON M20) 20 MEQ tablet TAKE 1 TABLET BY MOUTH EVERY DAY 90 tablet 3   No current facility-administered medications for this visit.    OBJECTIVE: white woman using a walker  Vitals:   09/15/21 1307  BP: (!) 156/44  Pulse: 74  Resp: 18  Temp: (!) 97.5 F (36.4 C)  SpO2: 98%      Body mass index is 30.31 kg/m.    ECOG FS:2 - Symptomatic, <50% confined to bed  Sclerae unicteric, EOMs intact Wearing a mask Breasts: Right breast status post recent lumpectomy, scar has been healing well.  LAB RESULTS:  CMP     Component Value Date/Time   NA 143 09/15/2021 1255   NA 141 07/09/2019 0000   NA 145 09/19/2017 0000   NA 145 09/18/2014 1245   K 4.0 09/15/2021 1255   K 3.7 09/19/2017 0000   K 3.9 09/18/2014 1245   CL 108 09/15/2021 1255   CO2 29 09/15/2021 1255   CO2 25 09/18/2014 1245   GLUCOSE 102 (H) 09/15/2021 1255   GLUCOSE 107 09/18/2014 1245   BUN 40 (H) 09/15/2021 1255   BUN 14 07/09/2019 0000   BUN 20.1 09/18/2014 1245   CREATININE 1.16 (H) 09/15/2021 1255   CREATININE 1.19 (H) 01/21/2021 0907   CREATININE 1.2 (H) 09/18/2014 1245   CALCIUM 9.0 09/15/2021  1255   CALCIUM 7.3 09/19/2017 0000   CALCIUM 9.1 09/18/2014 1245   PROT 6.5 09/15/2021 1255   PROT 5.1 09/19/2017 0000   PROT 6.8 09/18/2014 1245   ALBUMIN  3.8 09/15/2021 1255   ALBUMIN 2.8 09/19/2017 0000   ALBUMIN 3.8 09/18/2014 1245   AST 14 (L) 09/15/2021 1255   AST 14 09/18/2014 1245   ALT 12 09/15/2021 1255   ALT 23 09/19/2017 0000   ALT 8 09/18/2014 1245   ALKPHOS 108 09/15/2021 1255   ALKPHOS 87 09/19/2017 0000   ALKPHOS 100 09/18/2014 1245   BILITOT 0.4 09/15/2021 1255   BILITOT 0.49 09/18/2014 1245   GFRNONAA 43 (L) 09/15/2021 1255   GFRNONAA 38 (L) 01/21/2021 0907   GFRAA 44 (L) 01/21/2021 0907    INo results found for: SPEP, UPEP  Lab Results  Component Value Date   WBC 6.4 09/15/2021   NEUTROABS 4.8 09/15/2021   HGB 11.5 (L) 09/15/2021   HCT 34.9 (L) 09/15/2021   MCV 93.3 09/15/2021   PLT 172 09/15/2021      Chemistry      Component Value Date/Time   NA 143 09/15/2021 1255   NA 141 07/09/2019 0000   NA 145 09/19/2017 0000   NA 145 09/18/2014 1245   K 4.0 09/15/2021 1255   K 3.7 09/19/2017 0000   K 3.9 09/18/2014 1245   CL 108 09/15/2021 1255   CO2 29 09/15/2021 1255   CO2 25 09/18/2014 1245   BUN 40 (H) 09/15/2021 1255   BUN 14 07/09/2019 0000   BUN 20.1 09/18/2014 1245   CREATININE 1.16 (H) 09/15/2021 1255   CREATININE 1.19 (H) 01/21/2021 0907   CREATININE 1.2 (H) 09/18/2014 1245   GLU 123 07/09/2019 0000      Component Value Date/Time   CALCIUM 9.0 09/15/2021 1255   CALCIUM 7.3 09/19/2017 0000   CALCIUM 9.1 09/18/2014 1245   ALKPHOS 108 09/15/2021 1255   ALKPHOS 87 09/19/2017 0000   ALKPHOS 100 09/18/2014 1245   AST 14 (L) 09/15/2021 1255   AST 14 09/18/2014 1245   ALT 12 09/15/2021 1255   ALT 23 09/19/2017 0000   ALT 8 09/18/2014 1245   BILITOT 0.4 09/15/2021 1255   BILITOT 0.49 09/18/2014 1245       Lab Results  Component Value Date   LABCA2 34 05/20/2011    No components found for: [id number]  No results for input(s):  INR in the last 168 hours.  Urinalysis    Component Value Date/Time   COLORURINE YELLOW 08/12/2017 0627   APPEARANCEUR CLEAR 08/12/2017 0627   LABSPEC 1.014 08/12/2017 0627   PHURINE 6.0 08/12/2017 0627   GLUCOSEU NEGATIVE 08/12/2017 0627   HGBUR SMALL (A) 08/12/2017 0627   HGBUR trace-intact 10/28/2009 0905   BILIRUBINUR NEGATIVE 08/12/2017 0627   KETONESUR NEGATIVE 08/12/2017 0627   PROTEINUR NEGATIVE 08/12/2017 0627   UROBILINOGEN 0.2 10/28/2009 0905   NITRITE NEGATIVE 08/12/2017 0627   LEUKOCYTESUR TRACE (A) 08/12/2017 0627    STUDIES: No results found.    ASSESSMENT: 88 y.o. BRCA negative [place] resident   (1) status post bilateral lumpectomies without sentinel lymph node biopsy in August 2007 for a right-sided 7 mm, grade 2, invasive ductal carcinoma, and a left-sided 8 mm, grade 1, invasive ductal carcinoma.  Both tumors were ER positive, HER-2/neu negative, both with a low proliferation fraction.   (a) On Arimidex from September 2007 until May 2009, at which time we switched to tamoxifen primarily secondary to concerns regarding osteoporosis, completing five years of antiestrogen September 2011.   (b)  Also received Zometa yearly, discontinued after 4 doses in 2011  (2)  Status post right breast upper outer quadrant  biopsy 09/11/2014 4 a 4.2 cm area of ductal carcinoma in situ, grade 2 or 3,  with extravasated mucin, estrogen and progesterone receptor positive   (3) status post right lumpectomy 10/01/2014 for a pT1a pNX, stage IA invasive ductal carcinoma, grade 2, estrogen  and progesterone receptor positive, HER-2 negative, with an MIB-1 of 38%. Margins were close but negative   (4) she did not need adjuvant radiation   (5) opted against anti-estrogens  (a)  DEXA scan 10/31/2014 shows a T score of -1.8.  (6)  the BreastNext gene panel performed at [facility] 09/19/2014 showed no deleterious mutations in ATM, BARD1, BRCA1, BRCA2, BRIP1, CDH1, CHEK2,  MRE11A, MUTYH, NBN, NF1, PALB2, PTEN, RAD50, RAD51C, RAD51D, or TP53.  (7) status post left lumpectomy 03/28/2019 for a pT1c pNX, stage IA invasive ductal carcinoma, with negative margins; the tumor was strongly estrogen and progesterone receptor positive, HER-2 not amplified, with an MIB-1 of 15%.  (8) she is status post  right lumpectomy on 08/18/2021, final pathology showed grade 2 IDC measuring 4 mm, DCIS, low to intermediate grade, resection margins are negative for invasive carcinoma, closest anterior margin at 2 mm prognostic showed ER 95% strong intensity, PR 95% strong intensity, Ki-67 of less than 1% and HER2 negative.   PLAN We have discussed the pathology from her recent surgery which again showed small strongly ER/PR positive invasive ductal carcinoma.  Given multiple breast cancers which are strongly ER/PR positive, I have recommended that she consider antiestrogen therapy again.  We have discussed about tamoxifen given her history of osteoporosis, discussed mechanism of action of tamoxifen and adverse effects from tamoxifen including but not limited to small risk of DVT/PE as well as endometrial cancer.   A benefit of tamoxifen apart from reduced risk of breast cancer would be improvement in bone density. She has agreed to try tamoxifen again.  She has taken this in the past back in 2009 and does not remember any adverse effects. Tamoxifen dispensed to the pharmacy of her choice. She was recommended to return to clinic in 4 months.  She should continue annual mammograms as long as she is in great shape.  All her questions were answered to the best of my knowledge.  Total encounter time 82minutes.*   *Total Encounter Time as defined by the Centers for Medicare and Medicaid Services includes, in addition to the face-to-face time of a patient visit (documented in the note above) non-face-to-face time: obtaining and reviewing outside history, ordering and reviewing medications, tests or  procedures, care coordination (communications with other health care professionals or caregivers) and documentation in the medical record.  Benay Pike MD

## 2021-09-16 ENCOUNTER — Telehealth: Payer: Self-pay | Admitting: *Deleted

## 2021-09-16 DIAGNOSIS — H35373 Puckering of macula, bilateral: Secondary | ICD-10-CM | POA: Diagnosis not present

## 2021-09-16 DIAGNOSIS — H43393 Other vitreous opacities, bilateral: Secondary | ICD-10-CM | POA: Diagnosis not present

## 2021-09-16 DIAGNOSIS — H353133 Nonexudative age-related macular degeneration, bilateral, advanced atrophic without subfoveal involvement: Secondary | ICD-10-CM | POA: Diagnosis not present

## 2021-09-16 DIAGNOSIS — H43813 Vitreous degeneration, bilateral: Secondary | ICD-10-CM | POA: Diagnosis not present

## 2021-09-16 NOTE — Telephone Encounter (Deleted)
-----   Message from Benay Pike, MD sent at 09/16/2021  7:24 AM EST ----- ?Looks like she could be dehydrated, BUN disproportionately higher, could you talk to her and encourage fluid intake of at least 1.5-2 L if her cardiac status allows. ?

## 2021-09-16 NOTE — Telephone Encounter (Addendum)
-----   Message from Benay Pike, MD sent at 09/16/2021  7:24 AM EST ----- ?Looks like she could be dehydrated, BUN disproportionately higher, could you talk to her and encourage fluid intake of at least 1.5-2 L if her cardiac status allows. ? ?Spoke with pt who verbalized understanding and agreement to increase fluids. ?

## 2021-09-24 ENCOUNTER — Encounter (HOSPITAL_COMMUNITY): Payer: Self-pay

## 2021-09-25 DIAGNOSIS — L84 Corns and callosities: Secondary | ICD-10-CM | POA: Diagnosis not present

## 2021-09-25 DIAGNOSIS — Q6689 Other  specified congenital deformities of feet: Secondary | ICD-10-CM | POA: Diagnosis not present

## 2021-09-25 DIAGNOSIS — B351 Tinea unguium: Secondary | ICD-10-CM | POA: Diagnosis not present

## 2021-10-01 ENCOUNTER — Telehealth: Payer: Self-pay | Admitting: *Deleted

## 2021-10-01 MED ORDER — APIXABAN 5 MG PO TABS: 5.0000 mg | ORAL_TABLET | Freq: Two times a day (BID) | ORAL | 3 refills | Status: DC

## 2021-10-01 NOTE — Telephone Encounter (Signed)
Eliquis assistance has been faxed to [facility] ?

## 2021-10-07 ENCOUNTER — Other Ambulatory Visit: Payer: Self-pay | Admitting: Allergy and Immunology

## 2021-10-08 ENCOUNTER — Other Ambulatory Visit: Payer: Self-pay | Admitting: *Deleted

## 2021-10-08 MED ORDER — METOPROLOL SUCCINATE ER 25 MG PO TB24: ORAL_TABLET | ORAL | 0 refills | Status: DC

## 2021-10-08 NOTE — Telephone Encounter (Signed)
[facility] requested refill.  ? ?

## 2021-10-12 ENCOUNTER — Encounter: Payer: Self-pay | Admitting: Orthopedic Surgery

## 2021-10-12 ENCOUNTER — Other Ambulatory Visit: Payer: Self-pay

## 2021-10-12 ENCOUNTER — Non-Acute Institutional Stay: Payer: MEDICARE | Admitting: Orthopedic Surgery

## 2021-10-12 VITALS — BP 112/62 | HR 77 | Temp 97.4°F | Ht 62.0 in | Wt 163.0 lb

## 2021-10-12 DIAGNOSIS — Z0000 Encounter for general adult medical examination without abnormal findings: Secondary | ICD-10-CM | POA: Diagnosis not present

## 2021-10-12 NOTE — Progress Notes (Addendum)
? ?Subjective:  ? [name] is a 88 y.o. female who presents for Medicare Annual (Subsequent) preventive examination. ? ?Place of Service: [facility] ?Provider: Windell Moulding, AGNP-C ? ? ?Review of Systems    ? ?Cardiac Risk Factors include: hypertension;advanced age (>4mn, >>53women) ? ?   ?Objective:  ?  ?Today's Vitals  ? 10/12/21 1439  ?BP: 112/62  ?Pulse: 77  ?Temp: (!) 97.4 ?F (36.3 ?C)  ?TempSrc: Temporal  ?SpO2: 98%  ?Weight: 163 lb (73.9 kg)  ?Height: '5\' 2"'$  (1.575 m)  ? ?Body mass index is 29.81 kg/m?. ? ? ?  10/12/2021  ?  8:42 AM 08/18/2021  ?  2:39 PM 08/07/2021  ?  1:32 PM 08/12/2020  ?  2:20 PM 06/20/2019  ?  1:52 AM 06/19/2019  ?  9:39 PM 03/16/2019  ?  9:12 AM  ?Advanced Directives  ?Does Patient Have a Medical Advance Directive? Yes Yes Yes Yes No No Yes  ?Type of Advance Directive Out of facility DNR (pink MOST or yellow form) [place] will Out of facility D[place] (pink MOST or yellow form)   [place]  ?Does patient want to make changes to medical advance directive? No - Patient declined No - Patient declined No - Patient declined No - Patient declined   No - Patient declined  ?Copy of HBelle Rosein Chart?  No - copy requested No - copy requested      ?Would patient like information on creating a medical advance directive?     No - Patient declined No - Guardian declined   ? ? ?Current Medications (verified) ?Outpatient Encounter Medications as of 10/12/2021  ?Medication Sig  ? apixaban (ELIQUIS) 5 MG TABS tablet Take 1 tablet (5 mg total) by mouth 2 (two) times daily.  ? atorvastatin (LIPITOR) 40 MG tablet Take 1 tablet (40 mg total) by mouth daily.  ? furosemide (LASIX) 40 MG tablet TAKE 1 TABLET BY MOUTH EVERY DAY  ? HYDROcodone-acetaminophen (NORCO/VICODIN) 5-325 MG tablet Take 1 tablet by mouth every 6 (six) hours as needed for moderate pain.  ? hydroxypropyl  methylcellulose / hypromellose (ISOPTO TEARS / GONIOVISC) 2.5 % ophthalmic solution Place 1 drop into both eyes in the morning and at bedtime.  ? ipratropium (ATROVENT) 0.06 % nasal spray Place 2 sprays into both nostrils 4 (four) times daily.  ? ketoconazole (NIZORAL) 2 % cream Apply 1 application. topically 2 (two) times daily as needed for irritation.  ? metoprolol succinate (TOPROL-XL) 25 MG 24 hr tablet Take one tablet by mouth once daily at bedtime. Needs an appointment before anymore future refills.  ? montelukast (SINGULAIR) 10 MG tablet TAKE 1 TABLET BY MOUTH AT NIGHT FOR COUGHING OR WHEEZING  ? Multiple Vitamins-Minerals (PRESERVISION AREDS 2) CAPS Take 1 capsule by mouth 2 (two) times daily.   ? pantoprazole (PROTONIX) 40 MG tablet Take 1 tablet (40 mg total) by mouth daily.  ? potassium chloride SA (KLOR-CON M20) 20 MEQ tablet TAKE 1 TABLET BY MOUTH EVERY DAY  ? tamoxifen (NOLVADEX) 20 MG tablet TAKE 1 TABLET BY MOUTH EVERY DAY  ? [DISCONTINUED] calcium carbonate (TUMS - DOSED IN MG ELEMENTAL CALCIUM) 500 MG chewable tablet Chew 1 tablet by mouth daily as needed for indigestion or heartburn. (Patient not taking: Reported on 10/12/2021)  ? [DISCONTINUED] ketoconazole (NIZORAL) 2 % cream Apply 1 application topically 2 (two) times daily. (Patient not taking: Reported on  10/12/2021)  ? ?No facility-administered encounter medications on file as of 10/12/2021.  ? ? ?Allergies (verified) ?Celecoxib  ? ?History: ?Past Medical History:  ?Diagnosis Date  ? Allergic rhinitis, seasonal   ? Breast cancer of upper-outer quadrant of right female breast ([place]) 09/13/2014  ? Chronic venous insufficiency   ? Dermatophytosis of nail   ? Dysrhythmia   ? H/o Atrial fibrillation  ? Edema leg   ? Family history of malignant neoplasm of breast   ? Family history of malignant neoplasm of ovary   ? Goiter   ? right thyroid nodule   ? HTN (hypertension) 08/13/2017  ? HX: breast cancer   ? bilateral  ? Menopausal syndrome   ?  Osteoarthritis   ? Osteoporosis   ? Squamous cell skin cancer   ? right lower leg  ? Wears glasses   ? Wears hearing aid   ? both ears  ? ?Past Surgical History:  ?Procedure Laterality Date  ? bilateral lumpectomies for breast cancer  Aug. 2007  ? BREAST LUMPECTOMY WITH RADIOACTIVE SEED LOCALIZATION Right 10/01/2014  ? Procedure: BREAST LUMPECTOMY WITH RADIOACTIVE SEED LOCALIZATION;  Surgeon: Erroll Luna, MD;  Location: [facility];  Service: General;  Laterality: Right;  ? BREAST LUMPECTOMY WITH RADIOACTIVE SEED LOCALIZATION Left 03/22/2019  ? Procedure: LEFT BREAST LUMPECTOMY WITH RADIOACTIVE SEED LOCALIZATION;  Surgeon: Erroll Luna, MD;  Location: [facility];  Service: General;  Laterality: Left;  ? BREAST LUMPECTOMY WITH RADIOACTIVE SEED LOCALIZATION Right 08/18/2021  ? Procedure: RIGHT BREAST LUMPECTOMY WITH RADIOACTIVE SEED LOCALIZATION;  Surgeon: Erroll Luna, MD;  Location: [facility];  Service: General;  Laterality: Right;  ? CATARACT EXTRACTION    ? CHOLECYSTECTOMY N/A 08/13/2017  ? Procedure: LAPAROSCOPIC CHOLECYSTECTOMY WITH INTRAOPERATIVE CHOLANGIOGRAM;  Surgeon: Excell Seltzer, MD;  Location: [facility];  Service: General;  Laterality: N/A;  ? DILATION AND CURETTAGE OF UTERUS    ? ESOPHAGOGASTRODUODENOSCOPY  07/26/2011  ? Procedure: ESOPHAGOGASTRODUODENOSCOPY (EGD);  Surgeon: Jeryl Columbia, MD;  Location: [facility];  Service: Endoscopy;  Laterality: N/A;  ? history of lumbar compression fracture    ? left eardum sx  1983  ? left knee arthroscopic surgery    ? no screening colonoscopy    ? s/p bilateral lumpectomies    ? SAVORY DILATION  07/26/2011  ? Procedure: SAVORY DILATION;  Surgeon: Jeryl Columbia, MD;  Location: [facility];  Service: Endoscopy;  Laterality: N/A;  ? status post resection squamous cell cancer right lower leg    ? TONSILLECTOMY    ? UMBILICAL HERNIA REPAIR    ? ?Family History  ?Problem Relation Age of Onset  ? Cancer Mother 65  ?     breast  ? Heart attack Father   ? Heart  attack Brother   ? Heart attack Brother   ? Cancer Maternal Aunt 55  ?     breast  ? Coronary artery disease Other   ? Cancer Other   ?     ovarian; daughter of mat aunt w/ BC in 20s  ? Heart disease Brother   ? Cancer Maternal Aunt 60  ?     breast  ? ?Social History  ? ?Socioeconomic History  ? Marital status: Married  ?  Spouse name: CLARIE CAMEY  ? Number of children: Not on file  ? Years of education: Not on file  ? Highest education level: Not on file  ?Occupational History  ? Not on file  ?Tobacco Use  ? Smoking  status: Never  ? Smokeless tobacco: Never  ?Vaping Use  ? Vaping Use: Never used  ?Substance and Sexual Activity  ? Alcohol use: No  ? Drug use: No  ? Sexual activity: Not Currently  ?Other Topics Concern  ? Not on file  ?Social History Narrative  ? Pt and her husband are Active members of [place] and have support from this community. No Children.   ? ?Social Determinants of Health  ? ?Financial Resource Strain: Low Risk   ? Difficulty of Paying Living Expenses: Not hard at all  ?Food Insecurity: No Food Insecurity  ? Worried About Charity fundraiser in the Last Year: Never true  ? Ran Out of Food in the Last Year: Never true  ?Transportation Needs: No Transportation Needs  ? Lack of Transportation (Medical): No  ? Lack of Transportation (Non-Medical): No  ?Physical Activity: Insufficiently Active  ? Days of Exercise per Week: 7 days  ? Minutes of Exercise per Session: 20 min  ?Stress: No Stress Concern Present  ? Feeling of Stress : Only a little  ?Social Connections: Socially Integrated  ? Frequency of Communication with Friends and Family: More than three times a week  ? Frequency of Social Gatherings with Friends and Family: More than three times a week  ? Attends Religious Services: More than 4 times per year  ? Active Member of Clubs or Organizations: Yes  ? Attends Archivist Meetings: Never  ? Marital Status: Married  ? ? ?Tobacco Counseling ?Counseling given: Not  Answered ? ? ?Clinical Intake: ? ?Pre-visit preparation completed: Yes ? ?Pain : No/denies pain ? ?  ? ?BMI - recorded: 29.8 ?Nutritional Status: BMI 25 -29 Overweight ?Nutritional Risks: None (advanced age) ?Diabetes: No ?

## 2021-10-12 NOTE — Patient Instructions (Signed)
?  Ms. Keatley , ?Thank you for taking time to come for your Medicare Wellness Visit. I appreciate your ongoing commitment to your health goals. Please review the following plan we discussed and let me know if I can assist you in the future.  ? ?These are the goals we discussed: ? Goals   ? ?  Patient Stated   ?  Maintain current level of health ?  ? ?  ?  ?This is a list of the screening recommended for you and due dates:  ?Health Maintenance  ?Topic Date Due  ? Zoster (Shingles) Vaccine (1 of 2) Never done  ? Tetanus Vaccine  05/04/2023  ? Pneumonia Vaccine  Completed  ? Flu Shot  Completed  ? DEXA scan (bone density measurement)  Completed  ? COVID-19 Vaccine  Completed  ? HPV Vaccine  Aged Out  ? Mammogram  Discontinued  ? ?Tetanus and Prevnar 20 recommended  ?

## 2021-10-15 ENCOUNTER — Ambulatory Visit: Payer: MEDICARE | Admitting: Podiatry

## 2021-10-22 ENCOUNTER — Other Ambulatory Visit: Payer: Self-pay | Admitting: Internal Medicine

## 2021-10-24 ENCOUNTER — Other Ambulatory Visit: Payer: Self-pay | Admitting: Allergy and Immunology

## 2021-10-26 ENCOUNTER — Telehealth: Payer: Self-pay | Admitting: Internal Medicine

## 2021-10-26 DIAGNOSIS — H9190 Unspecified hearing loss, unspecified ear: Secondary | ICD-10-CM

## 2021-10-26 NOTE — Telephone Encounter (Signed)
Pt called stating she has appt [facility] 11/09/21 10a with [name] hearing test & needs referral to be updated ? ? ?Please update ?Lattie Haw ?

## 2021-10-27 ENCOUNTER — Ambulatory Visit (INDEPENDENT_AMBULATORY_CARE_PROVIDER_SITE_OTHER): Payer: MEDICARE | Admitting: Allergy and Immunology

## 2021-10-27 ENCOUNTER — Other Ambulatory Visit: Payer: Self-pay | Admitting: Orthopedic Surgery

## 2021-10-27 ENCOUNTER — Other Ambulatory Visit: Payer: Self-pay | Admitting: Internal Medicine

## 2021-10-27 ENCOUNTER — Encounter: Payer: Self-pay | Admitting: Allergy and Immunology

## 2021-10-27 VITALS — BP 138/86 | HR 80 | Temp 97.8°F | Resp 16 | Ht 62.5 in | Wt 167.0 lb

## 2021-10-27 DIAGNOSIS — J3089 Other allergic rhinitis: Secondary | ICD-10-CM

## 2021-10-27 NOTE — Progress Notes (Signed)
? ?[facility] ? ? ?Follow-up Note ? ?Referring Provider: Virgie Dad, MD ?Primary Provider: Virgie Dad, MD ?Date of Office Visit: 10/27/2021 ? ?Subjective:  ? ?[name] (DOB: 08/05/1921) is a 88 y.o. female who returns to the [facility] on 10/27/2021 in re-evaluation of the following: ? ?HPI: Mahlani returns to this clinic in evaluation of chronic rhinitis.  I last saw her in this clinic on 28 October 2020. ? ?As long as she uses her nasal ipratropium and her montelukast she is pretty satisfied with the response that she is receiving regarding her chronic rhinorrhea and some of her drainage in her throat.  The ipratropium definitely does work for several hours and she uses this medication at least twice a day and sometimes more often. ? ?Allergies as of 10/27/2021   ? ?   Reactions  ? Celecoxib Swelling  ? Swelling-leg  ? ?  ? ?  ?Medication List  ? ? ?apixaban 5 MG Tabs tablet ?Commonly known as: Eliquis ?Take 1 tablet (5 mg total) by mouth 2 (two) times daily. ?  ?atorvastatin 40 MG tablet ?Commonly known as: LIPITOR ?Take 1 tablet (40 mg total) by mouth daily. ?  ?furosemide 40 MG tablet ?Commonly known as: LASIX ?TAKE 1 TABLET BY MOUTH EVERY DAY ?  ?hydroxypropyl methylcellulose / hypromellose 2.5 % ophthalmic solution ?Commonly known as: ISOPTO TEARS / GONIOVISC ?Place 1 drop into both eyes in the morning and at bedtime. ?  ?ipratropium 0.06 % nasal spray ?Commonly known as: ATROVENT ?Place 2 sprays into both nostrils 4 (four) times daily. ?  ?ketoconazole 2 % cream ?Commonly known as: NIZORAL ?Apply 1 application. topically 2 (two) times daily as needed for irritation. ?  ?Klor-Con M20 20 MEQ tablet ?Generic drug: potassium chloride SA ?TAKE 1 TABLET BY MOUTH EVERY DAY ?  ?metoprolol succinate 25 MG 24 hr tablet ?Commonly known as: TOPROL-XL ?Take one tablet by mouth once daily at bedtime. Needs an appointment before anymore future  refills. ?  ?montelukast 10 MG tablet ?Commonly known as: SINGULAIR ?TAKE 1 TABLET BY MOUTH AT NIGHT FOR COUGHING OR WHEEZING ?  ?pantoprazole 40 MG tablet ?Commonly known as: PROTONIX ?TAKE 1 TABLET BY MOUTH EVERY DAY ?  ?PreserVision AREDS 2 Caps ?Take 1 capsule by mouth 2 (two) times daily. ?  ?tamoxifen 20 MG tablet ?Commonly known as: NOLVADEX ?TAKE 1 TABLET BY MOUTH EVERY DAY ?  ? ?Past Medical History:  ?Diagnosis Date  ? Allergic rhinitis, seasonal   ? Breast cancer of upper-outer quadrant of right female breast ([place]) 09/13/2014  ? Chronic venous insufficiency   ? Dermatophytosis of nail   ? Dysrhythmia   ? H/o Atrial fibrillation  ? Edema leg   ? Family history of malignant neoplasm of breast   ? Family history of malignant neoplasm of ovary   ? Goiter   ? right thyroid nodule   ? HTN (hypertension) 08/13/2017  ? HX: breast cancer   ? bilateral  ? Menopausal syndrome   ? Osteoarthritis   ? Osteoporosis   ? Squamous cell skin cancer   ? right lower leg  ? Wears glasses   ? Wears hearing aid   ? both ears  ? ? ?Past Surgical History:  ?Procedure Laterality Date  ? bilateral lumpectomies for breast cancer  Aug. 2007  ? BREAST LUMPECTOMY WITH RADIOACTIVE SEED LOCALIZATION Right 10/01/2014  ? Procedure: BREAST LUMPECTOMY WITH RADIOACTIVE SEED LOCALIZATION;  Surgeon: Erroll Luna, MD;  Location: [facility];  Service: General;  Laterality: Right;  ? BREAST LUMPECTOMY WITH RADIOACTIVE SEED LOCALIZATION Left 03/22/2019  ? Procedure: LEFT BREAST LUMPECTOMY WITH RADIOACTIVE SEED LOCALIZATION;  Surgeon: Erroll Luna, MD;  Location: [facility];  Service: General;  Laterality: Left;  ? BREAST LUMPECTOMY WITH RADIOACTIVE SEED LOCALIZATION Right 08/18/2021  ? Procedure: RIGHT BREAST LUMPECTOMY WITH RADIOACTIVE SEED LOCALIZATION;  Surgeon: Erroll Luna, MD;  Location: [facility];  Service: General;  Laterality: Right;  ? CATARACT EXTRACTION    ? CHOLECYSTECTOMY N/A 08/13/2017  ? Procedure: LAPAROSCOPIC CHOLECYSTECTOMY  WITH INTRAOPERATIVE CHOLANGIOGRAM;  Surgeon: Excell Seltzer, MD;  [facility];  Service: General;  Laterality: N/A;  ? DILATION AND CURETTAGE OF UTERUS    ? ESOPHAGOGASTRODUODENOSCOPY  07/26/2011  ? Procedure: ESOPHAGOGASTRODUODENOSCOPY (EGD);  Surgeon: Jeryl Columbia, MD;  Location: [facility];  Service: Endoscopy;  Laterality: N/A;  ? history of lumbar compression fracture    ? left eardum sx  1983  ? left knee arthroscopic surgery    ? no screening colonoscopy    ? s/p bilateral lumpectomies    ? SAVORY DILATION  07/26/2011  ? Procedure: SAVORY DILATION;  Surgeon: Jeryl Columbia, MD;  [facility];  Service: Endoscopy;  Laterality: N/A;  ? status post resection squamous cell cancer right lower leg    ? TONSILLECTOMY    ? UMBILICAL HERNIA REPAIR    ? ? ?Review of systems negative except as noted in HPI / PMHx or noted below: ? ?Review of Systems  ?Constitutional: Negative.   ?HENT: Negative.    ?Eyes: Negative.   ?Respiratory: Negative.    ?Cardiovascular: Negative.   ?Gastrointestinal: Negative.   ?Genitourinary: Negative.   ?Musculoskeletal: Negative.   ?Skin: Negative.   ?Neurological: Negative.   ?Endo/Heme/Allergies: Negative.   ?Psychiatric/Behavioral: Negative.    ? ? ?Objective:  ? ?Vitals:  ? 10/27/21 1504  ?BP: 138/86  ?Pulse: 80  ?Resp: 16  ?Temp: 97.8 ?F (36.6 ?C)  ?SpO2: 99%  ? ?Height: 5' 2.5" (158.8 cm)  ?Weight: 167 lb (75.8 kg)  ? ?Physical Exam ?Constitutional:   ?   Appearance: She is not diaphoretic.  ?HENT:  ?   Head: Normocephalic.  ?   Right Ear: External ear normal.  ?   Left Ear: External ear normal.  ?   Ears:  ?   Comments: Hearing aids ?   Nose: Nose normal. No mucosal edema or rhinorrhea.  ?   Mouth/Throat:  ?   Pharynx: Uvula midline. No oropharyngeal exudate.  ?Eyes:  ?   Conjunctiva/sclera: Conjunctivae normal.  ?Neck:  ?   Thyroid: No thyromegaly.  ?   Trachea: Trachea normal. No tracheal tenderness or tracheal deviation.  ?Cardiovascular:  ?   Rate and Rhythm: Normal  rate and regular rhythm.  ?   Heart sounds: Normal heart sounds, S1 normal and S2 normal. No murmur heard. ?Pulmonary:  ?   Effort: No respiratory distress.  ?   Breath sounds: Normal breath sounds. No stridor. No wheezing or rales.  ?Lymphadenopathy:  ?   Head:  ?   Right side of head: No tonsillar adenopathy.  ?   Left side of head: No tonsillar adenopathy.  ?   Cervical: No cervical adenopathy.  ?Skin: ?   Findings: No erythema or rash.  ?   Nails: There is no clubbing.  ?Neurological:  ?   Mental Status: She is alert.  ? ? ?Diagnostics: none ? ?Assessment  and Plan:  ? ?1. Perennial allergic rhinitis   ? ?1.  Continue montelukast 10 mg tablet 1 time per day ?  ?2.  Continue Ipratropium 0.06% 2 sprays each nostril every 6 hours to dry up nose ?  ?3.  Return to clinic in 12 months or earlier if problem ? ?Dimple is doing relatively well on her current plan of a leukotriene modifier and a dose of nasal anticholinergic agents and I am not really going to change any of her therapy today.  There is no reason for her to follow-up in this clinic on a regular basis if her primary care doctor can refill her medications but I will be happy to see her back in this clinic once a year or earlier if there is a problem. ? ?Allena Katz, MD ?Allergy / Immunology ?[place] [facility] ?

## 2021-10-27 NOTE — Telephone Encounter (Signed)
"  Pt called stating she has appt with [facility] 11/09/21 10a with [name] hearing test & needs referral to be updated ?Please update ?[name]" ? ? ?Mentioned sent from [facility]. Please advise. ? ?

## 2021-10-27 NOTE — Patient Instructions (Signed)
?  ?  ?  1.  Continue montelukast 10 mg tablet 1 time per day ?  ?2.  Continue Ipratropium 0.06% 2 sprays each nostril every 6 hours to dry up nose ?  ?3.  Return to clinic in 12 months or earlier if problem ? ?

## 2021-10-28 ENCOUNTER — Encounter: Payer: Self-pay | Admitting: Allergy and Immunology

## 2021-10-29 NOTE — Telephone Encounter (Signed)
Status update: Referral has been updated for consult to audiology.  ? ? ?

## 2021-11-02 ENCOUNTER — Other Ambulatory Visit: Payer: Self-pay | Admitting: Internal Medicine

## 2021-11-02 DIAGNOSIS — H353132 Nonexudative age-related macular degeneration, bilateral, intermediate dry stage: Secondary | ICD-10-CM | POA: Diagnosis not present

## 2021-11-02 DIAGNOSIS — H04123 Dry eye syndrome of bilateral lacrimal glands: Secondary | ICD-10-CM | POA: Diagnosis not present

## 2021-11-02 DIAGNOSIS — H02035 Senile entropion of left lower eyelid: Secondary | ICD-10-CM | POA: Diagnosis not present

## 2021-11-04 ENCOUNTER — Other Ambulatory Visit: Payer: Self-pay | Admitting: Internal Medicine

## 2021-11-06 DIAGNOSIS — H90A21 Sensorineural hearing loss, unilateral, right ear, with restricted hearing on the contralateral side: Secondary | ICD-10-CM | POA: Diagnosis not present

## 2021-11-06 DIAGNOSIS — H90A32 Mixed conductive and sensorineural hearing loss, unilateral, left ear with restricted hearing on the contralateral side: Secondary | ICD-10-CM | POA: Diagnosis not present

## 2021-11-06 NOTE — Telephone Encounter (Signed)
[facility] called requesting the order to be faxed due to Patient at Office for evaluation.  ? ?Re Printed order and faxed to Fax:[phone] ?

## 2021-11-19 ENCOUNTER — Ambulatory Visit: Payer: MEDICARE | Admitting: Cardiovascular Disease

## 2021-11-23 DIAGNOSIS — H02035 Senile entropion of left lower eyelid: Secondary | ICD-10-CM | POA: Diagnosis not present

## 2021-11-23 DIAGNOSIS — H1131 Conjunctival hemorrhage, right eye: Secondary | ICD-10-CM | POA: Diagnosis not present

## 2021-11-23 DIAGNOSIS — H04123 Dry eye syndrome of bilateral lacrimal glands: Secondary | ICD-10-CM | POA: Diagnosis not present

## 2021-11-26 ENCOUNTER — Other Ambulatory Visit: Payer: Self-pay | Admitting: Cardiovascular Disease

## 2021-11-26 ENCOUNTER — Ambulatory Visit (INDEPENDENT_AMBULATORY_CARE_PROVIDER_SITE_OTHER): Payer: MEDICARE | Admitting: Cardiovascular Disease

## 2021-11-26 ENCOUNTER — Encounter: Payer: Self-pay | Admitting: Cardiovascular Disease

## 2021-11-26 VITALS — BP 120/50 | HR 82 | Ht 63.0 in | Wt 162.4 lb

## 2021-11-26 DIAGNOSIS — E7800 Pure hypercholesterolemia, unspecified: Secondary | ICD-10-CM

## 2021-11-26 DIAGNOSIS — D6869 Other thrombophilia: Secondary | ICD-10-CM | POA: Diagnosis not present

## 2021-11-26 DIAGNOSIS — I25119 Atherosclerotic heart disease of native coronary artery with unspecified angina pectoris: Secondary | ICD-10-CM | POA: Diagnosis not present

## 2021-11-26 DIAGNOSIS — I480 Paroxysmal atrial fibrillation: Secondary | ICD-10-CM

## 2021-11-26 NOTE — Patient Instructions (Signed)

## 2021-11-26 NOTE — Telephone Encounter (Signed)
?*  STAT* If patient is at the pharmacy, call can be transferred to refill team. ? ? ?1. Which medications need to be refilled? (please list name of each medication and dose if known)  ?apixaban (ELIQUIS) 5 MG TABS tablet ? ?2. Which pharmacy/location (including street and city if local pharmacy) is medication to be sent to? ?[id number] , [place] [address]? ?3. Do they need a 30 day or 90 day supply?  ? ?90 day supply ? ?

## 2021-11-26 NOTE — Progress Notes (Signed)
? ?Cardiology office note  ? ?Date:  11/26/2021  ? ?ID:  [name], DOB [birth year], MRN [id number] ? ?PCP:  Virgie Dad, MD  ?Cardiologist:  Sanda Klein, MD  ?Electrophysiologist:  None  ? ?Evaluation Performed:  Follow-Up Visit ? ?Chief Complaint: Atrial fibrillation ? ?History of Present Illness:   ? ?[name] is a 88 y.o. female with history of paroxysmal atrial fibrillation, complicated by non-STEMI and transient congestive heart failure in the setting of cholecystectomy in January 2019.  She had COVID-19 pneumonia with hypoxemia and required brief hospitalization in late 2020, recovered well from this. ? ?She is feeling well.  Recently celebrated her 28th wedding anniversary with her husband who has turned 17.   ? ?Only cardiovascular complaint is persistent lower extremity edema.  She has been wearing compression stockings for the last 8 years or so, with generally good results.  The edema is worse at the end of the day usually resolves after she is laying in bed overnight. ? ?The patient specifically denies any chest pain at rest exertion, dyspnea at rest or with exertion, orthopnea, paroxysmal nocturnal dyspnea, syncope, palpitations, focal neurological deficits, intermittent claudication, lower extremity edema, unexplained weight gain, cough, hemoptysis or wheezing. ? ?She has not had any falls, injuries or serious bleeding problems.  She is compliant with anticoagulation with apixaban.  She is in sinus rhythm today. ? ? ?Past Medical History:  ?Diagnosis Date  ? Allergic rhinitis, seasonal   ? Breast cancer of upper-outer quadrant of right female breast ([place]) 09/13/2014  ? Chronic venous insufficiency   ? Dermatophytosis of nail   ? Dysrhythmia   ? H/o Atrial fibrillation  ? Edema leg   ? Family history of malignant neoplasm of breast   ? Family history of malignant neoplasm of ovary   ? Goiter   ? right thyroid nodule   ? HTN (hypertension) 08/13/2017  ? HX: breast cancer   ? bilateral  ?  Menopausal syndrome   ? Osteoarthritis   ? Osteoporosis   ? Squamous cell skin cancer   ? right lower leg  ? Wears glasses   ? Wears hearing aid   ? both ears  ? ?Past Surgical History:  ?Procedure Laterality Date  ? bilateral lumpectomies for breast cancer  Aug. 2007  ? BREAST LUMPECTOMY WITH RADIOACTIVE SEED LOCALIZATION Right 10/01/2014  ? Procedure: BREAST LUMPECTOMY WITH RADIOACTIVE SEED LOCALIZATION;  Surgeon: Erroll Luna, MD;  Location: [facility];  Service: General;  Laterality: Right;  ? BREAST LUMPECTOMY WITH RADIOACTIVE SEED LOCALIZATION Left 03/22/2019  ? Procedure: LEFT BREAST LUMPECTOMY WITH RADIOACTIVE SEED LOCALIZATION;  Surgeon: Erroll Luna, MD;  Location: [facility];  Service: General;  Laterality: Left;  ? BREAST LUMPECTOMY WITH RADIOACTIVE SEED LOCALIZATION Right 08/18/2021  ? Procedure: RIGHT BREAST LUMPECTOMY WITH RADIOACTIVE SEED LOCALIZATION;  Surgeon: Erroll Luna, MD;  Location: [facility];  Service: General;  Laterality: Right;  ? CATARACT EXTRACTION    ? CHOLECYSTECTOMY N/A 08/13/2017  ? Procedure: LAPAROSCOPIC CHOLECYSTECTOMY WITH INTRAOPERATIVE CHOLANGIOGRAM;  Surgeon: Excell Seltzer, MD;  Location: [facility];  Service: General;  Laterality: N/A;  ? DILATION AND CURETTAGE OF UTERUS    ? ESOPHAGOGASTRODUODENOSCOPY  07/26/2011  ? Procedure: ESOPHAGOGASTRODUODENOSCOPY (EGD);  Surgeon: Jeryl Columbia, MD;  Location: [facility];  Service: Endoscopy;  Laterality: N/A;  ? history of lumbar compression fracture    ? left eardum sx  1983  ? left knee arthroscopic surgery    ? no screening colonoscopy    ?  s/p bilateral lumpectomies    ? SAVORY DILATION  07/26/2011  ? Procedure: SAVORY DILATION;  Surgeon: Jeryl Columbia, MD;  Location: [facility];  Service: Endoscopy;  Laterality: N/A;  ? status post resection squamous cell cancer right lower leg    ? TONSILLECTOMY    ? UMBILICAL HERNIA REPAIR    ?  ? ?Current Meds  ?Medication Sig  ? apixaban (ELIQUIS) 5 MG TABS tablet Take 1 tablet (5 mg  total) by mouth 2 (two) times daily.  ? atorvastatin (LIPITOR) 40 MG tablet TAKE 1 TABLET BY MOUTH EVERY DAY  ? furosemide (LASIX) 40 MG tablet TAKE 1 TABLET BY MOUTH EVERY DAY  ? hydroxypropyl methylcellulose / hypromellose (ISOPTO TEARS / GONIOVISC) 2.5 % ophthalmic solution Place 1 drop into both eyes in the morning and at bedtime.  ? ipratropium (ATROVENT) 0.06 % nasal spray Place 2 sprays into both nostrils 4 (four) times daily.  ? ketoconazole (NIZORAL) 2 % cream Apply 1 application. topically 2 (two) times daily as needed for irritation.  ? KLOR-CON M20 20 MEQ tablet TAKE 1 TABLET BY MOUTH EVERY DAY  ? metoprolol succinate (TOPROL-XL) 25 MG 24 hr tablet Take one tablet by mouth once daily at bedtime.  ? montelukast (SINGULAIR) 10 MG tablet TAKE 1 TABLET BY MOUTH AT NIGHT FOR COUGHING OR WHEEZING  ? Multiple Vitamins-Minerals (PRESERVISION AREDS 2) CAPS Take 1 capsule by mouth 2 (two) times daily.   ? pantoprazole (PROTONIX) 40 MG tablet TAKE 1 TABLET BY MOUTH EVERY DAY  ? tamoxifen (NOLVADEX) 20 MG tablet TAKE 1 TABLET BY MOUTH EVERY DAY  ?  ? ?Allergies:   Celecoxib  ? ?Social History  ? ?Tobacco Use  ? Smoking status: Never  ? Smokeless tobacco: Never  ?Vaping Use  ? Vaping Use: Never used  ?Substance Use Topics  ? Alcohol use: No  ? Drug use: No  ?  ? ?Family Hx: ?The patient's family history includes Cancer in an other family member; Cancer (age of onset: 35) in her maternal aunt; Cancer (age of onset: 61) in her maternal aunt; Cancer (age of onset: 38) in her mother; Coronary artery disease in an other family member; Heart attack in her brother, brother, and father; Heart disease in her brother. ? ?ROS:   ?Please see the history of present illness.   All other systems are reviewed and are negative.  ? ?Prior CV studies:   ?The following studies were reviewed today: ? ?Echo from 08/26/2017 shows LVEF 50-55%, pseudonormal mitral inflow, moderate mitral insufficiency and moderate tricuspid insufficiency,  mild to moderately dilated left atrium ? ?Duplex venous ultrasound 05/11/2019 was negative for thrombus ? ?Labs/Other Tests and Data Reviewed:   ? ?EKG: Ordered today and personally reviewed.  Shows normal sinus rhythm, left ventricular hypertrophy with mild QRS broadening at 106 ms, left axis deviation, QTc 455 ms ? ?Recent Labs: ?01/01/2021: TSH 3.87 ?09/15/2021: ALT 12; BUN 40; Creatinine 1.16; Hemoglobin 11.5; Platelet Count 172; Potassium 4.0; Sodium 143  ? ?Recent Lipid Panel ?Lab Results  ?Component Value Date/Time  ? CHOL 145 01/01/2021 08:00 AM  ? CHOL 109 09/19/2017 12:00 AM  ? TRIG 114 01/01/2021 08:00 AM  ? TRIG 107 09/19/2017 12:00 AM  ? HDL 50 01/01/2021 08:00 AM  ? CHOLHDL 2.9 01/01/2021 08:00 AM  ? LDLCALC 75 01/01/2021 08:00 AM  ? LDLCALC 59 09/19/2017 12:00 AM  ? LDLDIRECT 173.9 06/25/2013 09:48 AM  ? ? ?Wt Readings from Last 3 Encounters:  ?11/26/21 162 lb  6.4 oz (73.7 kg)  ?10/27/21 167 lb (75.8 kg)  ?10/12/21 163 lb (73.9 kg)  ?  ? ?Objective:   ? ?Vital Signs:  BP (!) 120/50 (BP Location: Left Arm, Patient Position: Sitting, Cuff Size: Normal)   Pulse 82   Ht '5\' 3"'$  (1.6 m)   Wt 162 lb 6.4 oz (73.7 kg)   SpO2 93%   BMI 28.77 kg/m?   ? ? ?General: Alert, oriented x3, no distress, appears well ?Head: Mild scleral hemorrhage right eye, no evidence of trauma, PERRL, EOMI, no exophtalmos or lid lag, no myxedema, no xanthelasma; normal ears, nose and oropharynx ?Neck: normal jugular venous pulsations and no hepatojugular reflux; brisk carotid pulses without delay and no carotid bruits ?Chest: clear to auscultation, no signs of consolidation by percussion or palpation, normal fremitus, symmetrical and full respiratory excursions ?Cardiovascular: normal position and quality of the apical impulse, regular rhythm, loud and sonorous first heart sound and normal second heart sounds, no murmurs, rubs or gallops ?Abdomen: no tenderness or distention, no masses by palpation, no abnormal pulsatility or  arterial bruits, normal bowel sounds, no hepatosplenomegaly ?Extremities: no clubbing, cyanosis or edema; 2+ radial, ulnar and brachial pulses bilaterally; 2+ right femoral, posterior tibial and dorsalis pedis p

## 2021-11-27 MED ORDER — APIXABAN 5 MG PO TABS: 5.0000 mg | ORAL_TABLET | Freq: Two times a day (BID) | ORAL | 1 refills | Status: DC

## 2021-11-27 NOTE — Telephone Encounter (Signed)
Prescription refill request for Eliquis received. ?Indication:Afib ?Last office visit:5/23 ?Scr:1.1 ?Age: 88 ?Weight:73.7 kg ? ?Prescription refilled ? ?

## 2021-11-30 ENCOUNTER — Telehealth: Payer: Self-pay | Admitting: Cardiovascular Disease

## 2021-11-30 ENCOUNTER — Other Ambulatory Visit: Payer: Self-pay

## 2021-11-30 NOTE — Telephone Encounter (Signed)
Called [name] to let her know the price difference. No answer, left a detailed message and the number to return the call if she needs to.  ?

## 2021-11-30 NOTE — Telephone Encounter (Signed)
Left message to call back  

## 2021-11-30 NOTE — Telephone Encounter (Addendum)
[employer] and spoke with her and the pt's husband. The patient is $200 under the threshold in order to get her medications covered by the foundation. They are requesting enough pills be sent to the pharmacy in order to help them get to the threshold. A 20 day supply was sent to the anticoag pool to have it filled. Will call [facility] to cancel the current refill that was sent on 5/12.  Cindy verbalized understanding and thanked me for helping them out.  ?

## 2021-11-30 NOTE — Telephone Encounter (Signed)
Called [facility] spoke to [facility]. He was able to change the amount to 15 day (30 tablets) total is $260.61. 30 day (60 tablets) is $336. Will call Jenny Reichmann to tell her the prices.  ?

## 2021-11-30 NOTE — Progress Notes (Signed)
[employer] and spoke with her and the pt's husband. The patient is $200 under the threshold in order to get her medications covered by the foundation. They are requesting enough pills be sent to the pharmacy in order to help them get to the threshold. A 20 day supply was sent to the anticoag pool to have it filled. Will call [facility] to cancel the current refill that was sent on 5/12.  Cindy verbalized understanding and thanked me for helping them out.  ?

## 2021-11-30 NOTE — Telephone Encounter (Signed)
Pt's care taker returning call from nurse. [name] would like a call back. Pt is with her and will give permission for her to speak with nurse. Please advise ?

## 2021-11-30 NOTE — Telephone Encounter (Signed)
Husband of the patient wanted to speak to Dr. Lurline Del Nurse about the application for Eliquis  ?

## 2021-12-01 ENCOUNTER — Ambulatory Visit (INDEPENDENT_AMBULATORY_CARE_PROVIDER_SITE_OTHER): Payer: MEDICARE | Admitting: Podiatry

## 2021-12-01 DIAGNOSIS — D2371 Other benign neoplasm of skin of right lower limb, including hip: Secondary | ICD-10-CM

## 2021-12-01 DIAGNOSIS — D2372 Other benign neoplasm of skin of left lower limb, including hip: Secondary | ICD-10-CM

## 2021-12-01 DIAGNOSIS — M79676 Pain in unspecified toe(s): Secondary | ICD-10-CM | POA: Diagnosis not present

## 2021-12-01 DIAGNOSIS — D689 Coagulation defect, unspecified: Secondary | ICD-10-CM

## 2021-12-01 DIAGNOSIS — B351 Tinea unguium: Secondary | ICD-10-CM

## 2021-12-01 NOTE — Progress Notes (Signed)
She presents today for her routine nail care. ? ?Objective: Vital signs stable oriented x3 toenails are long thick yellow dystrophic onychomycotic benign skin lesions plantar aspect of the forefoot second metatarsal phalangeal joints bilateral. ? ?Assessment: Pain in limb secondary to benign skin lesions plantar aspect forefoot bilateral.  Pain in limb secondary to onychomycosis. ? ?Plan: Debridement of benign skin lesions and placed padding today.  Also debridement of nails 1 through 5 bilateral covered service secondary to pain. ?

## 2021-12-06 ENCOUNTER — Other Ambulatory Visit: Payer: Self-pay | Admitting: Allergy and Immunology

## 2022-01-13 ENCOUNTER — Inpatient Hospital Stay: Payer: MEDICARE | Attending: Hematology and Oncology | Admitting: Hematology and Oncology

## 2022-01-13 ENCOUNTER — Encounter: Payer: Self-pay | Admitting: Hematology and Oncology

## 2022-01-13 ENCOUNTER — Other Ambulatory Visit: Payer: Self-pay

## 2022-01-13 VITALS — BP 138/49 | HR 87 | Temp 98.1°F | Resp 16 | Ht 63.0 in | Wt 164.1 lb

## 2022-01-13 DIAGNOSIS — Z79810 Long term (current) use of selective estrogen receptor modulators (SERMs): Secondary | ICD-10-CM | POA: Diagnosis not present

## 2022-01-13 DIAGNOSIS — Z803 Family history of malignant neoplasm of breast: Secondary | ICD-10-CM | POA: Diagnosis not present

## 2022-01-13 DIAGNOSIS — Z170 Estrogen receptor positive status [ER+]: Secondary | ICD-10-CM | POA: Insufficient documentation

## 2022-01-13 DIAGNOSIS — Z8041 Family history of malignant neoplasm of ovary: Secondary | ICD-10-CM | POA: Insufficient documentation

## 2022-01-13 DIAGNOSIS — Z7901 Long term (current) use of anticoagulants: Secondary | ICD-10-CM | POA: Insufficient documentation

## 2022-01-13 DIAGNOSIS — C50412 Malignant neoplasm of upper-outer quadrant of left female breast: Secondary | ICD-10-CM

## 2022-01-13 DIAGNOSIS — I4891 Unspecified atrial fibrillation: Secondary | ICD-10-CM | POA: Diagnosis not present

## 2022-01-13 DIAGNOSIS — Z9049 Acquired absence of other specified parts of digestive tract: Secondary | ICD-10-CM | POA: Diagnosis not present

## 2022-01-13 DIAGNOSIS — R5383 Other fatigue: Secondary | ICD-10-CM | POA: Diagnosis not present

## 2022-01-13 DIAGNOSIS — Z8249 Family history of ischemic heart disease and other diseases of the circulatory system: Secondary | ICD-10-CM | POA: Diagnosis not present

## 2022-01-13 DIAGNOSIS — I10 Essential (primary) hypertension: Secondary | ICD-10-CM | POA: Diagnosis not present

## 2022-01-13 DIAGNOSIS — C50411 Malignant neoplasm of upper-outer quadrant of right female breast: Secondary | ICD-10-CM | POA: Insufficient documentation

## 2022-01-13 NOTE — Telephone Encounter (Signed)
Assistance approved through 07/18/22 

## 2022-01-13 NOTE — Progress Notes (Signed)
[place]  [phone] [phone]     ID: XOE HOE DOB: Jul 17, 1922  MR#: [id number]  NKN#:[id number]  Patient Care Team: Taylor Dad, MD as PCP - General (Internal Medicine) Taylor Klein, MD as PCP - Cardiology (Cardiology) Taylor Luna, MD as Consulting Physician (General Surgery) Taylor Koh, MD (Inactive) as Consulting Physician (Radiation Oncology) Taylor Kaufmann, RN as Registered Nurse Taylor Germany, RN as Registered Nurse Taylor Essex, MD as Consulting Physician (Gastroenterology) Taylor Pike, MD as Consulting Physician (Hematology and Oncology)  CHIEF COMPLAINT: Estrogen receptor positive breast cancer  CURRENT TREATMENT: observation  INTERVAL HISTORY:  [name] returns today for for follow-up of her more recent left breast cancer with her friend today.   Since last visit, she had an abnormal mammogram and right lumpectomy on 08/18/2021. Pathology showed grade 2 invasive ductal carcinoma, low to intermediate grade DCIS, resection margins negative, prognostic showed ER 95% strong intensity, PR 95% strong intensity and HER2 negative.,  KI of 1%. She has recovered well from her lumpectomy and is here to discuss any further recommendations.  She is very active 88 year old, lives in an independent living with her 86 year old husband.  She works in [employer], walks to [employer] frequently and tries to stay as active as she can.  She is very hard of hearing and had her friend who is a retired Therapist, sports today. Rest of the pertinent 10 point review of systems reviewed and negative.  REVIEW OF SYSTEMS:  A detailed review of systems today was benign.   COVID 19 VACCINATION STATUS: Moderna x4, last 12/2020   BREAST CANCER HISTORY: From the earlier summary note:  I saw [name] previously for a history of bilateral breast cancers, treated with bilateral lumpectomies and anti-estrogens for 5 years. She did not receive radiation  treatments. She was last seen here in 2011.  She had her annual screening mammography at [facility] 08/27/2013, and this showed the breast density to be category B. There was an area of new grouped heterogeneous calcifications in the right breast at the 11:00 middle depth. A focal asymmetric density anterior to that area was seen in one view only. Additional views were recommended, but if they were performed I do not have those records. There appears to have been mammography on 03/05/2014, but again I do not have those records.  On 08/30/2014, bilateral diagnostic mammography with tomosynthesis this was obtained. The calcifications at the 11:00 position in the right breast were increased in number. There were no other significant findings. Biopsy of the area in question to 20 11/06/2014 showed ([id number]) high-grade ductal carcinoma in situ, with mucin extravasation.no definitive invasive component was seen however. This high-grade noninvasive tumor was estrogen receptor positive at 100%, progesterone receptor positive at 44%, both with strong staining intensity.  Her subsequent history is as detailed below   PAST MEDICAL HISTORY: Past Medical History:  Diagnosis Date   Allergic rhinitis, seasonal    Breast cancer of upper-outer quadrant of right female breast ([place]) 09/13/2014   Chronic venous insufficiency    Dermatophytosis of nail    Dysrhythmia    H/o Atrial fibrillation   Edema leg    Family history of malignant neoplasm of breast    Family history of malignant neoplasm of ovary    Goiter    right thyroid nodule    HTN (hypertension) 08/13/2017   HX: breast cancer    bilateral   Menopausal syndrome    Osteoarthritis  Osteoporosis    Squamous cell skin cancer    right lower leg   Wears glasses    Wears hearing aid    both ears    PAST SURGICAL HISTORY: Past Surgical History:  Procedure Laterality Date   bilateral lumpectomies for breast cancer  Aug. 2007   BREAST LUMPECTOMY  WITH RADIOACTIVE SEED LOCALIZATION Right 10/01/2014   Procedure: BREAST LUMPECTOMY WITH RADIOACTIVE SEED LOCALIZATION;  Surgeon: Taylor Luna, MD;  Location: [place];  Service: General;  Laterality: Right;   BREAST LUMPECTOMY WITH RADIOACTIVE SEED LOCALIZATION Left 03/22/2019   Procedure: LEFT BREAST LUMPECTOMY WITH RADIOACTIVE SEED LOCALIZATION;  Surgeon: Taylor Luna, MD;  Location: [place];  Service: General;  Laterality: Left;   BREAST LUMPECTOMY WITH RADIOACTIVE SEED LOCALIZATION Right 08/18/2021   Procedure: RIGHT BREAST LUMPECTOMY WITH RADIOACTIVE SEED LOCALIZATION;  Surgeon: Taylor Luna, MD;  Location: [place];  Service: General;  Laterality: Right;   CATARACT EXTRACTION     CHOLECYSTECTOMY N/A 08/13/2017   Procedure: LAPAROSCOPIC CHOLECYSTECTOMY WITH INTRAOPERATIVE CHOLANGIOGRAM;  Surgeon: Taylor Seltzer, MD;  Location: [facility];  Service: General;  Laterality: N/A;   DILATION AND CURETTAGE OF UTERUS     ESOPHAGOGASTRODUODENOSCOPY  07/26/2011   Procedure: ESOPHAGOGASTRODUODENOSCOPY (EGD);  Surgeon: Taylor Columbia, MD;  Location: [facility];  Service: Endoscopy;  Laterality: N/A;   history of lumbar compression fracture     left eardum sx  1983   left knee arthroscopic surgery     no screening colonoscopy     s/p bilateral lumpectomies     SAVORY DILATION  07/26/2011   Procedure: SAVORY DILATION;  Surgeon: Taylor Columbia, MD;  Location: [facility];  Service: Endoscopy;  Laterality: N/A;   status post resection squamous cell cancer right lower leg     TONSILLECTOMY     UMBILICAL HERNIA REPAIR      FAMILY HISTORY Family History  Problem Relation Age of Onset   Cancer Mother 108       breast   Heart attack Father    Heart attack Brother    Heart attack Brother    Cancer Maternal Aunt 36       breast   Coronary artery disease Other    Cancer Other        ovarian; daughter of mat aunt w/ BC in 45s   Heart disease Brother    Cancer Maternal Aunt 60       breast   the patient's father died at age 69 from a heart attack. The patient's mother died at age 39. The patient's mother was one of 60 sisters, 9 siblings. 2 of the sisters had breast cancer, postmenopausal. [name] self had 2 half-brothers and 1 full brother as well as one half-sister.There is no history of ovarian cancer in the familyexcept as noted.   GYNECOLOGIC HISTORY:  No LMP recorded. Patient is postmenopausal. Menarche age 53, the patient is GX P0. 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 Her husband [name] is a former Programme researcher, broadcasting/film/video. They currently reside at friends home [place], with no pets.    ADVANCED DIRECTIVES: in place   HEALTH MAINTENANCE: Social History   Tobacco Use   Smoking status: Never   Smokeless tobacco: Never  Vaping Use   Vaping Use: Never used  Substance Use Topics   Alcohol use: No   Drug  use: No     Colonoscopy:  PAP:  Bone density:  Lipid panel:  Allergies  Allergen Reactions   Celecoxib Swelling    Swelling-leg    Current Outpatient Medications  Medication Sig Dispense Refill   ipratropium (ATROVENT) 0.06 % nasal spray PLACE 2 SPRAYS INTO BOTH NOSTRILS 4 TIMES A DAY 15 mL 10   apixaban (ELIQUIS) 5 MG TABS tablet Take 1 tablet (5 mg total) by mouth 2 (two) times daily. 180 tablet 1   atorvastatin (LIPITOR) 40 MG tablet TAKE 1 TABLET BY MOUTH EVERY DAY 90 tablet 1   furosemide (LASIX) 40 MG tablet TAKE 1 TABLET BY MOUTH EVERY DAY 90 tablet 3   hydroxypropyl methylcellulose / hypromellose (ISOPTO TEARS / GONIOVISC) 2.5 % ophthalmic solution Place 1 drop into both eyes in the morning and at bedtime.     ketoconazole (NIZORAL) 2 % cream Apply 1 application. topically 2 (two) times daily as needed for irritation.     KLOR-CON M20 20 MEQ tablet TAKE 1 TABLET BY MOUTH EVERY DAY 90 tablet 3    metoprolol succinate (TOPROL-XL) 25 MG 24 hr tablet Take one tablet by mouth once daily at bedtime. 90 tablet 1   montelukast (SINGULAIR) 10 MG tablet TAKE 1 TABLET BY MOUTH AT NIGHT FOR COUGHING OR WHEEZING 30 tablet 0   Multiple Vitamins-Minerals (PRESERVISION AREDS 2) CAPS Take 1 capsule by mouth 2 (two) times daily.      pantoprazole (PROTONIX) 40 MG tablet TAKE 1 TABLET BY MOUTH EVERY DAY 90 tablet 1   tamoxifen (NOLVADEX) 20 MG tablet TAKE 1 TABLET BY MOUTH EVERY DAY 90 tablet 1   No current facility-administered medications for this visit.    OBJECTIVE: white woman using a walker  Vitals:   01/13/22 1447  BP: (!) 138/49  Pulse: 87  Resp: 16  Temp: 98.1 F (36.7 C)  SpO2: 97%      Body mass index is 29.07 kg/m.    ECOG FS:2 - Symptomatic, <50% confined to bed  Sclerae unicteric, EOMs intact Wearing a mask Breasts: Right breast status post recent lumpectomy, scar has been healing well.  LAB RESULTS:  CMP     Component Value Date/Time   NA 143 09/15/2021 1255   NA 141 07/09/2019 0000   NA 145 09/19/2017 0000   NA 145 09/18/2014 1245   K 4.0 09/15/2021 1255   K 3.7 09/19/2017 0000   K 3.9 09/18/2014 1245   CL 108 09/15/2021 1255   CO2 29 09/15/2021 1255   CO2 25 09/18/2014 1245   GLUCOSE 102 (H) 09/15/2021 1255   GLUCOSE 107 09/18/2014 1245   BUN 40 (H) 09/15/2021 1255   BUN 14 07/09/2019 0000   BUN 20.1 09/18/2014 1245   CREATININE 1.16 (H) 09/15/2021 1255   CREATININE 1.19 (H) 01/21/2021 0907   CREATININE 1.2 (H) 09/18/2014 1245   CALCIUM 9.0 09/15/2021 1255   CALCIUM 7.3 09/19/2017 0000   CALCIUM 9.1 09/18/2014 1245   PROT 6.5 09/15/2021 1255   PROT 5.1 09/19/2017 0000   PROT 6.8 09/18/2014 1245   ALBUMIN 3.8 09/15/2021 1255   ALBUMIN 2.8 09/19/2017 0000   ALBUMIN 3.8 09/18/2014 1245   AST 14 (L) 09/15/2021 1255   AST 14 09/18/2014 1245   ALT 12 09/15/2021 1255   ALT 23 09/19/2017 0000   ALT 8 09/18/2014 1245   ALKPHOS 108 09/15/2021 1255    ALKPHOS 87 09/19/2017 0000   ALKPHOS 100 09/18/2014 1245   BILITOT 0.4 09/15/2021  1255   BILITOT 0.49 09/18/2014 1245   GFRNONAA 43 (L) 09/15/2021 1255   GFRNONAA 38 (L) 01/21/2021 0907   GFRAA 44 (L) 01/21/2021 0907    INo results found for: "SPEP", "UPEP"  Lab Results  Component Value Date   WBC 6.4 09/15/2021   NEUTROABS 4.8 09/15/2021   HGB 11.5 (L) 09/15/2021   HCT 34.9 (L) 09/15/2021   MCV 93.3 09/15/2021   PLT 172 09/15/2021      Chemistry      Component Value Date/Time   NA 143 09/15/2021 1255   NA 141 07/09/2019 0000   NA 145 09/19/2017 0000   NA 145 09/18/2014 1245   K 4.0 09/15/2021 1255   K 3.7 09/19/2017 0000   K 3.9 09/18/2014 1245   CL 108 09/15/2021 1255   CO2 29 09/15/2021 1255   CO2 25 09/18/2014 1245   BUN 40 (H) 09/15/2021 1255   BUN 14 07/09/2019 0000   BUN 20.1 09/18/2014 1245   CREATININE 1.16 (H) 09/15/2021 1255   CREATININE 1.19 (H) 01/21/2021 0907   CREATININE 1.2 (H) 09/18/2014 1245   GLU 123 07/09/2019 0000      Component Value Date/Time   CALCIUM 9.0 09/15/2021 1255   CALCIUM 7.3 09/19/2017 0000   CALCIUM 9.1 09/18/2014 1245   ALKPHOS 108 09/15/2021 1255   ALKPHOS 87 09/19/2017 0000   ALKPHOS 100 09/18/2014 1245   AST 14 (L) 09/15/2021 1255   AST 14 09/18/2014 1245   ALT 12 09/15/2021 1255   ALT 23 09/19/2017 0000   ALT 8 09/18/2014 1245   BILITOT 0.4 09/15/2021 1255   BILITOT 0.49 09/18/2014 1245       Lab Results  Component Value Date   LABCA2 34 05/20/2011    No components found for: "LABCA125"  No results for input(s): "INR" in the last 168 hours.  Urinalysis    Component Value Date/Time   COLORURINE YELLOW 08/12/2017 0627   APPEARANCEUR CLEAR 08/12/2017 0627   LABSPEC 1.014 08/12/2017 0627   PHURINE 6.0 08/12/2017 0627   GLUCOSEU NEGATIVE 08/12/2017 0627   HGBUR SMALL (A) 08/12/2017 0627   HGBUR trace-intact 10/28/2009 0905   BILIRUBINUR NEGATIVE 08/12/2017 0627   KETONESUR NEGATIVE 08/12/2017 0627    PROTEINUR NEGATIVE 08/12/2017 0627   UROBILINOGEN 0.2 10/28/2009 0905   NITRITE NEGATIVE 08/12/2017 0627   LEUKOCYTESUR TRACE (A) 08/12/2017 0627    STUDIES: No results found.    ASSESSMENT: 88 y.o. BRCA negative [place] resident   (1) status post bilateral lumpectomies without sentinel lymph node biopsy in August 2007 for a right-sided 7 mm, grade 2, invasive ductal carcinoma, and a left-sided 8 mm, grade 1, invasive ductal carcinoma.  Both tumors were ER positive, HER-2/neu negative, both with a low proliferation fraction.   (a) On Arimidex from September 2007 until May 2009, at which time we switched to tamoxifen primarily secondary to concerns regarding osteoporosis, completing five years of antiestrogen September 2011.   (b)  Also received Zometa yearly, discontinued after 4 doses in 2011  (2)  Status post right breast upper outer quadrant biopsy 09/11/2014 4 a 4.2 cm area of ductal carcinoma in situ, grade 2 or 3,  with extravasated mucin, estrogen and progesterone receptor positive   (3) status post right lumpectomy 10/01/2014 for a pT1a pNX, stage IA invasive ductal carcinoma, grade 2, estrogen  and progesterone receptor positive, HER-2 negative, with an MIB-1 of 38%. Margins were close but negative   (4) she did not need  adjuvant radiation   (5) opted against anti-estrogens  (a)  DEXA scan 10/31/2014 shows a T score of -1.8.  (6)  the BreastNext gene panel performed at [facility] 09/19/2014 showed no deleterious mutations in ATM, BARD1, BRCA1, BRCA2, BRIP1, CDH1, CHEK2, MRE11A, MUTYH, NBN, NF1, PALB2, PTEN, RAD50, RAD51C, RAD51D, or TP53.  (7) status post left lumpectomy 03/28/2019 for a pT1c pNX, stage IA invasive ductal carcinoma, with negative margins; the tumor was strongly estrogen and progesterone receptor positive, HER-2 not amplified, with an MIB-1 of 15%.  (8) she is status post  right lumpectomy on 08/18/2021, final pathology showed grade 2 IDC measuring 4  mm, DCIS, low to intermediate grade, resection margins are negative for invasive carcinoma, closest anterior margin at 2 mm prognostic showed ER 95% strong intensity, PR 95% strong intensity, Ki-67 of less than 1% and HER2 negative.   PLAN She is here for follow up on Tamoxifen. She is tolerating it well   All her questions were answered to the best of my knowledge.  Total encounter time 71minutes.*   *Total Encounter Time as defined by the Centers for Medicare and Medicaid Services includes, in addition to the face-to-face time of a patient visit (documented in the note above) non-face-to-face time: obtaining and reviewing outside history, ordering and reviewing medications, tests or procedures, care coordination (communications with other health care professionals or caregivers) and documentation in the medical record.  Taylor Pike MD

## 2022-01-13 NOTE — Progress Notes (Signed)
[place]  Telephone:([phone] Fax:(336) [phone]     ID: [name] DOB: [birth year]  MR#: [id number]  NLZ#:[id number]  Patient Care Team: Virgie Dad, MD as PCP - General (Internal Medicine) Sanda Klein, MD as PCP - Cardiology (Cardiology) Erroll Luna, MD as Consulting Physician (General Surgery) Arloa Koh, MD (Inactive) as Consulting Physician (Radiation Oncology) [name], RN as Registered Nurse [name], RN as Registered Nurse [name], MD as Consulting Physician (Gastroenterology) Benay Pike, MD as Consulting Physician (Hematology and Oncology)  CHIEF COMPLAINT: Estrogen receptor positive breast cancer  CURRENT TREATMENT: observation  INTERVAL HISTORY:  [name] returns today for for follow-up of her more recent left breast cancer with her friend today.   She is taking tamoxifen as prescribed.  She is tolerating this very well.  She reports some fatigue but otherwise no new hot flashes, vaginal wetness or vaginal bleeding.  She takes Eliquis.  She has not noticed any changes in her breast.  She is hard of hearing.    Rest of the pertinent 10 point review of systems reviewed and negative.  REVIEW OF SYSTEMS:  A detailed review of systems today was benign.   COVID 19 VACCINATION STATUS: Moderna x4, last 12/2020   BREAST CANCER HISTORY: From the earlier summary note:  I saw [name] previously for a history of bilateral breast cancers, treated with bilateral lumpectomies and anti-estrogens for 5 years. She did not receive radiation treatments. She was last seen here in 2011.  She had her annual screening mammography at [facility] 08/27/2013, and this showed the breast density to be category B. There was an area of new grouped heterogeneous calcifications in the right breast at the 11:00 middle depth. A focal asymmetric density anterior to that area was seen in one view only. Additional views were recommended, but if they  were performed I do not have those records. There appears to have been mammography on 03/05/2014, but again I do not have those records.  On 08/30/2014, bilateral diagnostic mammography with tomosynthesis this was obtained. The calcifications at the 11:00 position in the right breast were increased in number. There were no other significant findings. Biopsy of the area in question to 20 11/06/2014 showed ([id number]) high-grade ductal carcinoma in situ, with mucin extravasation.no definitive invasive component was seen however. This high-grade noninvasive tumor was estrogen receptor positive at 100%, progesterone receptor positive at 44%, both with strong staining intensity.  Her subsequent history is as detailed below   PAST MEDICAL HISTORY: Past Medical History:  Diagnosis Date   Allergic rhinitis, seasonal    Breast cancer of upper-outer quadrant of right female breast ([place]) 09/13/2014   Chronic venous insufficiency    Dermatophytosis of nail    Dysrhythmia    H/o Atrial fibrillation   Edema leg    Family history of malignant neoplasm of breast    Family history of malignant neoplasm of ovary    Goiter    right thyroid nodule    HTN (hypertension) 08/13/2017   HX: breast cancer    bilateral   Menopausal syndrome    Osteoarthritis    Osteoporosis    Squamous cell skin cancer    right lower leg   Wears glasses    Wears hearing aid    both ears    PAST SURGICAL HISTORY: Past Surgical History:  Procedure Laterality Date   bilateral lumpectomies for breast cancer  Aug. 2007   BREAST LUMPECTOMY WITH RADIOACTIVE SEED LOCALIZATION Right  10/01/2014   Procedure: BREAST LUMPECTOMY WITH RADIOACTIVE SEED LOCALIZATION;  Surgeon: Harriette Bouillon, MD;  Location: [facility];  Service: General;  Laterality: Right;   BREAST LUMPECTOMY WITH RADIOACTIVE SEED LOCALIZATION Left 03/22/2019   Procedure: LEFT BREAST LUMPECTOMY WITH RADIOACTIVE SEED LOCALIZATION;  Surgeon: Harriette Bouillon,  MD;  Location: [facility];  Service: General;  Laterality: Left;   BREAST LUMPECTOMY WITH RADIOACTIVE SEED LOCALIZATION Right 08/18/2021   Procedure: RIGHT BREAST LUMPECTOMY WITH RADIOACTIVE SEED LOCALIZATION;  Surgeon: Harriette Bouillon, MD;  Location: [facility];  Service: General;  Laterality: Right;   CATARACT EXTRACTION     CHOLECYSTECTOMY N/A 08/13/2017   Procedure: LAPAROSCOPIC CHOLECYSTECTOMY WITH INTRAOPERATIVE CHOLANGIOGRAM;  Surgeon: Glenna Fellows, MD;  Location: [facility];  Service: General;  Laterality: N/A;   DILATION AND CURETTAGE OF UTERUS     ESOPHAGOGASTRODUODENOSCOPY  07/26/2011   Procedure: ESOPHAGOGASTRODUODENOSCOPY (EGD);  Surgeon: Petra Kuba, MD;  Location: [facility];  Service: Endoscopy;  Laterality: N/A;   history of lumbar compression fracture     left eardum sx  1983   left knee arthroscopic surgery     no screening colonoscopy     s/p bilateral lumpectomies     SAVORY DILATION  07/26/2011   Procedure: SAVORY DILATION;  Surgeon: Petra Kuba, MD;  Location: [facility];  Service: Endoscopy;  Laterality: N/A;   status post resection squamous cell cancer right lower leg     TONSILLECTOMY     UMBILICAL HERNIA REPAIR      FAMILY HISTORY Family History  Problem Relation Age of Onset   Cancer Mother 23       breast   Heart attack Father    Heart attack Brother    Heart attack Brother    Cancer Maternal Aunt 73       breast   Coronary artery disease Other    Cancer Other        ovarian; daughter of mat aunt w/ BC in 71s   Heart disease Brother    Cancer Maternal Aunt 58       breast  the patient's father died at age 61 from a heart attack. The patient's mother died at age 22. The patient's mother was one of 8 sisters, 9 siblings. 2 of the sisters had breast cancer, postmenopausal. [name] self had 2 half-brothers and 1 full brother as well as one half-sister.There is no history of ovarian cancer in the familyexcept as noted.   GYNECOLOGIC HISTORY:  No LMP  recorded. Patient is postmenopausal. Menarche age 6, the patient is GX P0. 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 Her husband [name] is a former Psychologist, educational. They currently reside at [place], with no pets.    ADVANCED DIRECTIVES: in place   HEALTH MAINTENANCE: Social History   Tobacco Use   Smoking status: Never   Smokeless tobacco: Never  Vaping Use   Vaping Use: Never used  Substance Use Topics   Alcohol use: No   Drug use: No     Colonoscopy:  PAP:  Bone density:  Lipid panel:  Allergies  Allergen Reactions   Celecoxib Swelling    Swelling-leg    Current Outpatient Medications  Medication Sig Dispense Refill   ipratropium (ATROVENT) 0.06 % nasal spray PLACE 2 SPRAYS INTO BOTH NOSTRILS 4 TIMES A DAY 15 mL 10  apixaban (ELIQUIS) 5 MG TABS tablet Take 1 tablet (5 mg total) by mouth 2 (two) times daily. 180 tablet 1   atorvastatin (LIPITOR) 40 MG tablet TAKE 1 TABLET BY MOUTH EVERY DAY 90 tablet 1   furosemide (LASIX) 40 MG tablet TAKE 1 TABLET BY MOUTH EVERY DAY 90 tablet 3   hydroxypropyl methylcellulose / hypromellose (ISOPTO TEARS / GONIOVISC) 2.5 % ophthalmic solution Place 1 drop into both eyes in the morning and at bedtime.     ketoconazole (NIZORAL) 2 % cream Apply 1 application. topically 2 (two) times daily as needed for irritation.     KLOR-CON M20 20 MEQ tablet TAKE 1 TABLET BY MOUTH EVERY DAY 90 tablet 3   metoprolol succinate (TOPROL-XL) 25 MG 24 hr tablet Take one tablet by mouth once daily at bedtime. 90 tablet 1   montelukast (SINGULAIR) 10 MG tablet TAKE 1 TABLET BY MOUTH AT NIGHT FOR COUGHING OR WHEEZING 30 tablet 0   Multiple Vitamins-Minerals (PRESERVISION AREDS 2) CAPS Take 1 capsule by mouth 2 (two) times daily.      pantoprazole (PROTONIX) 40 MG tablet TAKE 1 TABLET BY MOUTH  EVERY DAY 90 tablet 1   tamoxifen (NOLVADEX) 20 MG tablet TAKE 1 TABLET BY MOUTH EVERY DAY 90 tablet 1   No current facility-administered medications for this visit.    OBJECTIVE: white woman using a walker  Vitals:   01/13/22 1447  BP: (!) 138/49  Pulse: 87  Resp: 16  Temp: 98.1 F (36.7 C)  SpO2: 97%      Body mass index is 29.07 kg/m.    ECOG FS:2 - Symptomatic, <50% confined to bed  Sclerae unicteric, EOMs intact Wearing a mask Breasts: Bilateral breasts inspected and palpated.  No new masses noted.  No regional adenopathy Lower extremities: She is wearing compression socks today, no new edema.  LAB RESULTS:  CMP     Component Value Date/Time   NA 143 09/15/2021 1255   NA 141 07/09/2019 0000   NA 145 09/19/2017 0000   NA 145 09/18/2014 1245   K 4.0 09/15/2021 1255   K 3.7 09/19/2017 0000   K 3.9 09/18/2014 1245   CL 108 09/15/2021 1255   CO2 29 09/15/2021 1255   CO2 25 09/18/2014 1245   GLUCOSE 102 (H) 09/15/2021 1255   GLUCOSE 107 09/18/2014 1245   BUN 40 (H) 09/15/2021 1255   BUN 14 07/09/2019 0000   BUN 20.1 09/18/2014 1245   CREATININE 1.16 (H) 09/15/2021 1255   CREATININE 1.19 (H) 01/21/2021 0907   CREATININE 1.2 (H) 09/18/2014 1245   CALCIUM 9.0 09/15/2021 1255   CALCIUM 7.3 09/19/2017 0000   CALCIUM 9.1 09/18/2014 1245   PROT 6.5 09/15/2021 1255   PROT 5.1 09/19/2017 0000   PROT 6.8 09/18/2014 1245   ALBUMIN 3.8 09/15/2021 1255   ALBUMIN 2.8 09/19/2017 0000   ALBUMIN 3.8 09/18/2014 1245   AST 14 (L) 09/15/2021 1255   AST 14 09/18/2014 1245   ALT 12 09/15/2021 1255   ALT 23 09/19/2017 0000   ALT 8 09/18/2014 1245   ALKPHOS 108 09/15/2021 1255   ALKPHOS 87 09/19/2017 0000   ALKPHOS 100 09/18/2014 1245   BILITOT 0.4 09/15/2021 1255   BILITOT 0.49 09/18/2014 1245   GFRNONAA 43 (L) 09/15/2021 1255   GFRNONAA 38 (L) 01/21/2021 0907   GFRAA 44 (L) 01/21/2021 0907    INo results found for: "SPEP", "UPEP"  Lab Results  Component Value Date    WBC 6.4  09/15/2021   NEUTROABS 4.8 09/15/2021   HGB 11.5 (L) 09/15/2021   HCT 34.9 (L) 09/15/2021   MCV 93.3 09/15/2021   PLT 172 09/15/2021      Chemistry      Component Value Date/Time   NA 143 09/15/2021 1255   NA 141 07/09/2019 0000   NA 145 09/19/2017 0000   NA 145 09/18/2014 1245   K 4.0 09/15/2021 1255   K 3.7 09/19/2017 0000   K 3.9 09/18/2014 1245   CL 108 09/15/2021 1255   CO2 29 09/15/2021 1255   CO2 25 09/18/2014 1245   BUN 40 (H) 09/15/2021 1255   BUN 14 07/09/2019 0000   BUN 20.1 09/18/2014 1245   CREATININE 1.16 (H) 09/15/2021 1255   CREATININE 1.19 (H) 01/21/2021 0907   CREATININE 1.2 (H) 09/18/2014 1245   GLU 123 07/09/2019 0000      Component Value Date/Time   CALCIUM 9.0 09/15/2021 1255   CALCIUM 7.3 09/19/2017 0000   CALCIUM 9.1 09/18/2014 1245   ALKPHOS 108 09/15/2021 1255   ALKPHOS 87 09/19/2017 0000   ALKPHOS 100 09/18/2014 1245   AST 14 (L) 09/15/2021 1255   AST 14 09/18/2014 1245   ALT 12 09/15/2021 1255   ALT 23 09/19/2017 0000   ALT 8 09/18/2014 1245   BILITOT 0.4 09/15/2021 1255   BILITOT 0.49 09/18/2014 1245       Lab Results  Component Value Date   LABCA2 34 05/20/2011    No components found for: "LABCA125"  No results for input(s): "INR" in the last 168 hours.  Urinalysis    Component Value Date/Time   COLORURINE YELLOW 08/12/2017 0627   APPEARANCEUR CLEAR 08/12/2017 0627   LABSPEC 1.014 08/12/2017 0627   PHURINE 6.0 08/12/2017 0627   GLUCOSEU NEGATIVE 08/12/2017 0627   HGBUR SMALL (A) 08/12/2017 0627   HGBUR trace-intact 10/28/2009 0905   BILIRUBINUR NEGATIVE 08/12/2017 0627   KETONESUR NEGATIVE 08/12/2017 0627   PROTEINUR NEGATIVE 08/12/2017 0627   UROBILINOGEN 0.2 10/28/2009 0905   NITRITE NEGATIVE 08/12/2017 0627   LEUKOCYTESUR TRACE (A) 08/12/2017 0627    STUDIES: No results found.    ASSESSMENT: 88 y.o. BRCA negative [place] resident   (1) status post bilateral lumpectomies without  sentinel lymph node biopsy in August 2007 for a right-sided 7 mm, grade 2, invasive ductal carcinoma, and a left-sided 8 mm, grade 1, invasive ductal carcinoma.  Both tumors were ER positive, HER-2/neu negative, both with a low proliferation fraction.   (a) On Arimidex from September 2007 until May 2009, at which time we switched to tamoxifen primarily secondary to concerns regarding osteoporosis, completing five years of antiestrogen September 2011.   (b)  Also received Zometa yearly, discontinued after 4 doses in 2011  (2)  Status post right breast upper outer quadrant biopsy 09/11/2014 4 a 4.2 cm area of ductal carcinoma in situ, grade 2 or 3,  with extravasated mucin, estrogen and progesterone receptor positive   (3) status post right lumpectomy 10/01/2014 for a pT1a pNX, stage IA invasive ductal carcinoma, grade 2, estrogen  and progesterone receptor positive, HER-2 negative, with an MIB-1 of 38%. Margins were close but negative   (4) she did not need adjuvant radiation   (5) opted against anti-estrogens  (a)  DEXA scan 10/31/2014 shows a T score of -1.8.  (6)  the BreastNext gene panel performed at [facility] 09/19/2014 showed no deleterious mutations in ATM, BARD1, BRCA1, BRCA2, BRIP1, CDH1, CHEK2, MRE11A, MUTYH, NBN, NF1,  PALB2, PTEN, RAD50, RAD51C, RAD51D, or TP53.  (7) status post left lumpectomy 03/28/2019 for a pT1c pNX, stage IA invasive ductal carcinoma, with negative margins; the tumor was strongly estrogen and progesterone receptor positive, HER-2 not amplified, with an MIB-1 of 15%.  (8) she is status post  right lumpectomy on 08/18/2021, final pathology showed grade 2 IDC measuring 4 mm, DCIS, low to intermediate grade, resection margins are negative for invasive carcinoma, closest anterior margin at 2 mm prognostic showed ER 95% strong intensity, PR 95% strong intensity, Ki-67 of less than 1% and HER2 negative.   PLAN  We have discussed the pathology from her recent  surgery which again showed small strongly ER/PR positive invasive ductal carcinoma.  Given multiple breast cancers which are strongly ER/PR positive, I have recommended that she consider tamoxifen again.  She is tolerating tamoxifen remarkably well.  No complaints.  Physical examination today bilateral lumpectomies with surgical scars, no new palpable masses or regional adenopathy.  I have encouraged her to continue tamoxifen, schedule her mammogram for January 2024 and return to clinic in 6 months or sooner as needed.  With regards to her fall, I wonder if this is autonomic dysfunction given her aging.  The wide gap between systolic and diastolic pressure is likely because of a competent of aortic regurgitation.  She was asked to hold onto some firm surface and not to change positions quickly. All her questions were answered to the best of my knowledge. Return to clinic in 6 months Total encounter time 71minutes.*   *Total Encounter Time as defined by the Centers for Medicare and Medicaid Services includes, in addition to the face-to-face time of a patient visit (documented in the note above) non-face-to-face time: obtaining and reviewing outside history, ordering and reviewing medications, tests or procedures, care coordination (communications with other health care professionals or caregivers) and documentation in the medical record.  Benay Pike MD

## 2022-01-21 ENCOUNTER — Other Ambulatory Visit: Payer: Self-pay | Admitting: Allergy and Immunology

## 2022-02-02 DIAGNOSIS — R2689 Other abnormalities of gait and mobility: Secondary | ICD-10-CM | POA: Diagnosis not present

## 2022-02-02 DIAGNOSIS — R278 Other lack of coordination: Secondary | ICD-10-CM | POA: Diagnosis not present

## 2022-02-02 DIAGNOSIS — Z9181 History of falling: Secondary | ICD-10-CM | POA: Diagnosis not present

## 2022-02-02 DIAGNOSIS — R2681 Unsteadiness on feet: Secondary | ICD-10-CM | POA: Diagnosis not present

## 2022-02-02 DIAGNOSIS — M6281 Muscle weakness (generalized): Secondary | ICD-10-CM | POA: Diagnosis not present

## 2022-02-04 DIAGNOSIS — Z9181 History of falling: Secondary | ICD-10-CM | POA: Diagnosis not present

## 2022-02-04 DIAGNOSIS — R2689 Other abnormalities of gait and mobility: Secondary | ICD-10-CM | POA: Diagnosis not present

## 2022-02-04 DIAGNOSIS — R278 Other lack of coordination: Secondary | ICD-10-CM | POA: Diagnosis not present

## 2022-02-04 DIAGNOSIS — R2681 Unsteadiness on feet: Secondary | ICD-10-CM | POA: Diagnosis not present

## 2022-02-04 DIAGNOSIS — M6281 Muscle weakness (generalized): Secondary | ICD-10-CM | POA: Diagnosis not present

## 2022-02-09 DIAGNOSIS — Z9181 History of falling: Secondary | ICD-10-CM | POA: Diagnosis not present

## 2022-02-09 DIAGNOSIS — R2681 Unsteadiness on feet: Secondary | ICD-10-CM | POA: Diagnosis not present

## 2022-02-09 DIAGNOSIS — R278 Other lack of coordination: Secondary | ICD-10-CM | POA: Diagnosis not present

## 2022-02-09 DIAGNOSIS — M6281 Muscle weakness (generalized): Secondary | ICD-10-CM | POA: Diagnosis not present

## 2022-02-09 DIAGNOSIS — R2689 Other abnormalities of gait and mobility: Secondary | ICD-10-CM | POA: Diagnosis not present

## 2022-02-11 DIAGNOSIS — R278 Other lack of coordination: Secondary | ICD-10-CM | POA: Diagnosis not present

## 2022-02-11 DIAGNOSIS — Z9181 History of falling: Secondary | ICD-10-CM | POA: Diagnosis not present

## 2022-02-11 DIAGNOSIS — R2689 Other abnormalities of gait and mobility: Secondary | ICD-10-CM | POA: Diagnosis not present

## 2022-02-11 DIAGNOSIS — M6281 Muscle weakness (generalized): Secondary | ICD-10-CM | POA: Diagnosis not present

## 2022-02-11 DIAGNOSIS — R2681 Unsteadiness on feet: Secondary | ICD-10-CM | POA: Diagnosis not present

## 2022-02-15 DIAGNOSIS — M6281 Muscle weakness (generalized): Secondary | ICD-10-CM | POA: Diagnosis not present

## 2022-02-15 DIAGNOSIS — R2689 Other abnormalities of gait and mobility: Secondary | ICD-10-CM | POA: Diagnosis not present

## 2022-02-15 DIAGNOSIS — R2681 Unsteadiness on feet: Secondary | ICD-10-CM | POA: Diagnosis not present

## 2022-02-15 DIAGNOSIS — R278 Other lack of coordination: Secondary | ICD-10-CM | POA: Diagnosis not present

## 2022-02-15 DIAGNOSIS — Z9181 History of falling: Secondary | ICD-10-CM | POA: Diagnosis not present

## 2022-02-16 DIAGNOSIS — M6281 Muscle weakness (generalized): Secondary | ICD-10-CM | POA: Diagnosis not present

## 2022-02-16 DIAGNOSIS — R2681 Unsteadiness on feet: Secondary | ICD-10-CM | POA: Diagnosis not present

## 2022-02-16 DIAGNOSIS — R2689 Other abnormalities of gait and mobility: Secondary | ICD-10-CM | POA: Diagnosis not present

## 2022-02-16 DIAGNOSIS — R278 Other lack of coordination: Secondary | ICD-10-CM | POA: Diagnosis not present

## 2022-02-16 DIAGNOSIS — Z9181 History of falling: Secondary | ICD-10-CM | POA: Diagnosis not present

## 2022-02-18 DIAGNOSIS — Z9181 History of falling: Secondary | ICD-10-CM | POA: Diagnosis not present

## 2022-02-18 DIAGNOSIS — M6281 Muscle weakness (generalized): Secondary | ICD-10-CM | POA: Diagnosis not present

## 2022-02-18 DIAGNOSIS — R2681 Unsteadiness on feet: Secondary | ICD-10-CM | POA: Diagnosis not present

## 2022-02-18 DIAGNOSIS — R278 Other lack of coordination: Secondary | ICD-10-CM | POA: Diagnosis not present

## 2022-02-18 DIAGNOSIS — R2689 Other abnormalities of gait and mobility: Secondary | ICD-10-CM | POA: Diagnosis not present

## 2022-02-19 DIAGNOSIS — R2681 Unsteadiness on feet: Secondary | ICD-10-CM | POA: Diagnosis not present

## 2022-02-19 DIAGNOSIS — M6281 Muscle weakness (generalized): Secondary | ICD-10-CM | POA: Diagnosis not present

## 2022-02-19 DIAGNOSIS — R2689 Other abnormalities of gait and mobility: Secondary | ICD-10-CM | POA: Diagnosis not present

## 2022-02-19 DIAGNOSIS — Z9181 History of falling: Secondary | ICD-10-CM | POA: Diagnosis not present

## 2022-02-19 DIAGNOSIS — R278 Other lack of coordination: Secondary | ICD-10-CM | POA: Diagnosis not present

## 2022-02-22 DIAGNOSIS — R2681 Unsteadiness on feet: Secondary | ICD-10-CM | POA: Diagnosis not present

## 2022-02-22 DIAGNOSIS — Z9181 History of falling: Secondary | ICD-10-CM | POA: Diagnosis not present

## 2022-02-22 DIAGNOSIS — R278 Other lack of coordination: Secondary | ICD-10-CM | POA: Diagnosis not present

## 2022-02-22 DIAGNOSIS — R2689 Other abnormalities of gait and mobility: Secondary | ICD-10-CM | POA: Diagnosis not present

## 2022-02-22 DIAGNOSIS — M6281 Muscle weakness (generalized): Secondary | ICD-10-CM | POA: Diagnosis not present

## 2022-02-23 DIAGNOSIS — R2681 Unsteadiness on feet: Secondary | ICD-10-CM | POA: Diagnosis not present

## 2022-02-23 DIAGNOSIS — R278 Other lack of coordination: Secondary | ICD-10-CM | POA: Diagnosis not present

## 2022-02-23 DIAGNOSIS — M6281 Muscle weakness (generalized): Secondary | ICD-10-CM | POA: Diagnosis not present

## 2022-02-23 DIAGNOSIS — Z9181 History of falling: Secondary | ICD-10-CM | POA: Diagnosis not present

## 2022-02-23 DIAGNOSIS — R2689 Other abnormalities of gait and mobility: Secondary | ICD-10-CM | POA: Diagnosis not present

## 2022-02-25 DIAGNOSIS — R278 Other lack of coordination: Secondary | ICD-10-CM | POA: Diagnosis not present

## 2022-02-25 DIAGNOSIS — R2689 Other abnormalities of gait and mobility: Secondary | ICD-10-CM | POA: Diagnosis not present

## 2022-02-25 DIAGNOSIS — Z9181 History of falling: Secondary | ICD-10-CM | POA: Diagnosis not present

## 2022-02-25 DIAGNOSIS — M6281 Muscle weakness (generalized): Secondary | ICD-10-CM | POA: Diagnosis not present

## 2022-02-25 DIAGNOSIS — R2681 Unsteadiness on feet: Secondary | ICD-10-CM | POA: Diagnosis not present

## 2022-02-26 DIAGNOSIS — R2689 Other abnormalities of gait and mobility: Secondary | ICD-10-CM | POA: Diagnosis not present

## 2022-02-26 DIAGNOSIS — R2681 Unsteadiness on feet: Secondary | ICD-10-CM | POA: Diagnosis not present

## 2022-02-26 DIAGNOSIS — M6281 Muscle weakness (generalized): Secondary | ICD-10-CM | POA: Diagnosis not present

## 2022-02-26 DIAGNOSIS — R278 Other lack of coordination: Secondary | ICD-10-CM | POA: Diagnosis not present

## 2022-02-26 DIAGNOSIS — Z9181 History of falling: Secondary | ICD-10-CM | POA: Diagnosis not present

## 2022-03-02 ENCOUNTER — Ambulatory Visit (INDEPENDENT_AMBULATORY_CARE_PROVIDER_SITE_OTHER): Payer: MEDICARE | Admitting: Podiatry

## 2022-03-02 DIAGNOSIS — D2371 Other benign neoplasm of skin of right lower limb, including hip: Secondary | ICD-10-CM | POA: Diagnosis not present

## 2022-03-02 DIAGNOSIS — M6281 Muscle weakness (generalized): Secondary | ICD-10-CM | POA: Diagnosis not present

## 2022-03-02 DIAGNOSIS — D2372 Other benign neoplasm of skin of left lower limb, including hip: Secondary | ICD-10-CM

## 2022-03-02 DIAGNOSIS — D689 Coagulation defect, unspecified: Secondary | ICD-10-CM | POA: Diagnosis not present

## 2022-03-02 DIAGNOSIS — R2681 Unsteadiness on feet: Secondary | ICD-10-CM | POA: Diagnosis not present

## 2022-03-02 DIAGNOSIS — B351 Tinea unguium: Secondary | ICD-10-CM

## 2022-03-02 DIAGNOSIS — M79676 Pain in unspecified toe(s): Secondary | ICD-10-CM | POA: Diagnosis not present

## 2022-03-02 DIAGNOSIS — R278 Other lack of coordination: Secondary | ICD-10-CM | POA: Diagnosis not present

## 2022-03-02 DIAGNOSIS — R2689 Other abnormalities of gait and mobility: Secondary | ICD-10-CM | POA: Diagnosis not present

## 2022-03-02 DIAGNOSIS — Z9181 History of falling: Secondary | ICD-10-CM | POA: Diagnosis not present

## 2022-03-02 NOTE — Progress Notes (Signed)
She presents today chief complaint of painful elongated toenails 1 through 5 bilaterally.  She is also complaining of painful calluses plantar aspect of the bilateral foot.  Objective: Toenails are long thick yellow dystrophic and mycotic pulses are palpable no open lesions or wounds.  She does demonstrate some benign skin lesions plantar aspect of forefoot bilateral.  Assessment: Pain in limb secondary to onychomycosis benign skin lesions.  Plan: Debridement of toenails 1 through 5 bilateral debridement of benign skin lesions bilateral.

## 2022-03-03 DIAGNOSIS — R2681 Unsteadiness on feet: Secondary | ICD-10-CM | POA: Diagnosis not present

## 2022-03-03 DIAGNOSIS — R278 Other lack of coordination: Secondary | ICD-10-CM | POA: Diagnosis not present

## 2022-03-03 DIAGNOSIS — Z9181 History of falling: Secondary | ICD-10-CM | POA: Diagnosis not present

## 2022-03-03 DIAGNOSIS — M6281 Muscle weakness (generalized): Secondary | ICD-10-CM | POA: Diagnosis not present

## 2022-03-03 DIAGNOSIS — R2689 Other abnormalities of gait and mobility: Secondary | ICD-10-CM | POA: Diagnosis not present

## 2022-03-04 DIAGNOSIS — Z9181 History of falling: Secondary | ICD-10-CM | POA: Diagnosis not present

## 2022-03-04 DIAGNOSIS — M6281 Muscle weakness (generalized): Secondary | ICD-10-CM | POA: Diagnosis not present

## 2022-03-04 DIAGNOSIS — R2681 Unsteadiness on feet: Secondary | ICD-10-CM | POA: Diagnosis not present

## 2022-03-04 DIAGNOSIS — R278 Other lack of coordination: Secondary | ICD-10-CM | POA: Diagnosis not present

## 2022-03-04 DIAGNOSIS — R2689 Other abnormalities of gait and mobility: Secondary | ICD-10-CM | POA: Diagnosis not present

## 2022-03-05 DIAGNOSIS — R2689 Other abnormalities of gait and mobility: Secondary | ICD-10-CM | POA: Diagnosis not present

## 2022-03-05 DIAGNOSIS — Z9181 History of falling: Secondary | ICD-10-CM | POA: Diagnosis not present

## 2022-03-05 DIAGNOSIS — M6281 Muscle weakness (generalized): Secondary | ICD-10-CM | POA: Diagnosis not present

## 2022-03-05 DIAGNOSIS — R2681 Unsteadiness on feet: Secondary | ICD-10-CM | POA: Diagnosis not present

## 2022-03-05 DIAGNOSIS — R278 Other lack of coordination: Secondary | ICD-10-CM | POA: Diagnosis not present

## 2022-03-09 DIAGNOSIS — R2681 Unsteadiness on feet: Secondary | ICD-10-CM | POA: Diagnosis not present

## 2022-03-09 DIAGNOSIS — Z9181 History of falling: Secondary | ICD-10-CM | POA: Diagnosis not present

## 2022-03-09 DIAGNOSIS — M6281 Muscle weakness (generalized): Secondary | ICD-10-CM | POA: Diagnosis not present

## 2022-03-09 DIAGNOSIS — R2689 Other abnormalities of gait and mobility: Secondary | ICD-10-CM | POA: Diagnosis not present

## 2022-03-09 DIAGNOSIS — R278 Other lack of coordination: Secondary | ICD-10-CM | POA: Diagnosis not present

## 2022-03-10 DIAGNOSIS — M6281 Muscle weakness (generalized): Secondary | ICD-10-CM | POA: Diagnosis not present

## 2022-03-10 DIAGNOSIS — R278 Other lack of coordination: Secondary | ICD-10-CM | POA: Diagnosis not present

## 2022-03-10 DIAGNOSIS — R2681 Unsteadiness on feet: Secondary | ICD-10-CM | POA: Diagnosis not present

## 2022-03-10 DIAGNOSIS — R2689 Other abnormalities of gait and mobility: Secondary | ICD-10-CM | POA: Diagnosis not present

## 2022-03-10 DIAGNOSIS — Z9181 History of falling: Secondary | ICD-10-CM | POA: Diagnosis not present

## 2022-03-11 DIAGNOSIS — M6281 Muscle weakness (generalized): Secondary | ICD-10-CM | POA: Diagnosis not present

## 2022-03-11 DIAGNOSIS — R2689 Other abnormalities of gait and mobility: Secondary | ICD-10-CM | POA: Diagnosis not present

## 2022-03-11 DIAGNOSIS — Z9181 History of falling: Secondary | ICD-10-CM | POA: Diagnosis not present

## 2022-03-11 DIAGNOSIS — R278 Other lack of coordination: Secondary | ICD-10-CM | POA: Diagnosis not present

## 2022-03-11 DIAGNOSIS — R2681 Unsteadiness on feet: Secondary | ICD-10-CM | POA: Diagnosis not present

## 2022-03-12 DIAGNOSIS — Z9181 History of falling: Secondary | ICD-10-CM | POA: Diagnosis not present

## 2022-03-12 DIAGNOSIS — R2689 Other abnormalities of gait and mobility: Secondary | ICD-10-CM | POA: Diagnosis not present

## 2022-03-12 DIAGNOSIS — M6281 Muscle weakness (generalized): Secondary | ICD-10-CM | POA: Diagnosis not present

## 2022-03-12 DIAGNOSIS — R2681 Unsteadiness on feet: Secondary | ICD-10-CM | POA: Diagnosis not present

## 2022-03-12 DIAGNOSIS — R278 Other lack of coordination: Secondary | ICD-10-CM | POA: Diagnosis not present

## 2022-03-16 DIAGNOSIS — M6281 Muscle weakness (generalized): Secondary | ICD-10-CM | POA: Diagnosis not present

## 2022-03-16 DIAGNOSIS — R2681 Unsteadiness on feet: Secondary | ICD-10-CM | POA: Diagnosis not present

## 2022-03-16 DIAGNOSIS — R2689 Other abnormalities of gait and mobility: Secondary | ICD-10-CM | POA: Diagnosis not present

## 2022-03-16 DIAGNOSIS — Z9181 History of falling: Secondary | ICD-10-CM | POA: Diagnosis not present

## 2022-03-16 DIAGNOSIS — R278 Other lack of coordination: Secondary | ICD-10-CM | POA: Diagnosis not present

## 2022-03-17 DIAGNOSIS — R2681 Unsteadiness on feet: Secondary | ICD-10-CM | POA: Diagnosis not present

## 2022-03-17 DIAGNOSIS — M6281 Muscle weakness (generalized): Secondary | ICD-10-CM | POA: Diagnosis not present

## 2022-03-17 DIAGNOSIS — Z9181 History of falling: Secondary | ICD-10-CM | POA: Diagnosis not present

## 2022-03-17 DIAGNOSIS — R2689 Other abnormalities of gait and mobility: Secondary | ICD-10-CM | POA: Diagnosis not present

## 2022-03-17 DIAGNOSIS — R278 Other lack of coordination: Secondary | ICD-10-CM | POA: Diagnosis not present

## 2022-03-18 DIAGNOSIS — Z9181 History of falling: Secondary | ICD-10-CM | POA: Diagnosis not present

## 2022-03-18 DIAGNOSIS — M6281 Muscle weakness (generalized): Secondary | ICD-10-CM | POA: Diagnosis not present

## 2022-03-18 DIAGNOSIS — R2681 Unsteadiness on feet: Secondary | ICD-10-CM | POA: Diagnosis not present

## 2022-03-18 DIAGNOSIS — R2689 Other abnormalities of gait and mobility: Secondary | ICD-10-CM | POA: Diagnosis not present

## 2022-03-18 DIAGNOSIS — R278 Other lack of coordination: Secondary | ICD-10-CM | POA: Diagnosis not present

## 2022-03-24 DIAGNOSIS — R2681 Unsteadiness on feet: Secondary | ICD-10-CM | POA: Diagnosis not present

## 2022-03-24 DIAGNOSIS — R2689 Other abnormalities of gait and mobility: Secondary | ICD-10-CM | POA: Diagnosis not present

## 2022-03-24 DIAGNOSIS — M6281 Muscle weakness (generalized): Secondary | ICD-10-CM | POA: Diagnosis not present

## 2022-03-24 DIAGNOSIS — R278 Other lack of coordination: Secondary | ICD-10-CM | POA: Diagnosis not present

## 2022-03-24 DIAGNOSIS — Z9181 History of falling: Secondary | ICD-10-CM | POA: Diagnosis not present

## 2022-03-25 DIAGNOSIS — R2681 Unsteadiness on feet: Secondary | ICD-10-CM | POA: Diagnosis not present

## 2022-03-25 DIAGNOSIS — Z9181 History of falling: Secondary | ICD-10-CM | POA: Diagnosis not present

## 2022-03-25 DIAGNOSIS — M6281 Muscle weakness (generalized): Secondary | ICD-10-CM | POA: Diagnosis not present

## 2022-03-25 DIAGNOSIS — R2689 Other abnormalities of gait and mobility: Secondary | ICD-10-CM | POA: Diagnosis not present

## 2022-03-25 DIAGNOSIS — R278 Other lack of coordination: Secondary | ICD-10-CM | POA: Diagnosis not present

## 2022-03-26 DIAGNOSIS — R2681 Unsteadiness on feet: Secondary | ICD-10-CM | POA: Diagnosis not present

## 2022-03-26 DIAGNOSIS — Z9181 History of falling: Secondary | ICD-10-CM | POA: Diagnosis not present

## 2022-03-26 DIAGNOSIS — R2689 Other abnormalities of gait and mobility: Secondary | ICD-10-CM | POA: Diagnosis not present

## 2022-03-26 DIAGNOSIS — R278 Other lack of coordination: Secondary | ICD-10-CM | POA: Diagnosis not present

## 2022-03-26 DIAGNOSIS — M6281 Muscle weakness (generalized): Secondary | ICD-10-CM | POA: Diagnosis not present

## 2022-03-30 DIAGNOSIS — R278 Other lack of coordination: Secondary | ICD-10-CM | POA: Diagnosis not present

## 2022-03-30 DIAGNOSIS — Z9181 History of falling: Secondary | ICD-10-CM | POA: Diagnosis not present

## 2022-03-30 DIAGNOSIS — R2689 Other abnormalities of gait and mobility: Secondary | ICD-10-CM | POA: Diagnosis not present

## 2022-03-30 DIAGNOSIS — M6281 Muscle weakness (generalized): Secondary | ICD-10-CM | POA: Diagnosis not present

## 2022-03-30 DIAGNOSIS — R2681 Unsteadiness on feet: Secondary | ICD-10-CM | POA: Diagnosis not present

## 2022-03-31 DIAGNOSIS — R278 Other lack of coordination: Secondary | ICD-10-CM | POA: Diagnosis not present

## 2022-03-31 DIAGNOSIS — Z9181 History of falling: Secondary | ICD-10-CM | POA: Diagnosis not present

## 2022-03-31 DIAGNOSIS — R2681 Unsteadiness on feet: Secondary | ICD-10-CM | POA: Diagnosis not present

## 2022-03-31 DIAGNOSIS — M6281 Muscle weakness (generalized): Secondary | ICD-10-CM | POA: Diagnosis not present

## 2022-03-31 DIAGNOSIS — R2689 Other abnormalities of gait and mobility: Secondary | ICD-10-CM | POA: Diagnosis not present

## 2022-04-01 DIAGNOSIS — R2681 Unsteadiness on feet: Secondary | ICD-10-CM | POA: Diagnosis not present

## 2022-04-01 DIAGNOSIS — R2689 Other abnormalities of gait and mobility: Secondary | ICD-10-CM | POA: Diagnosis not present

## 2022-04-01 DIAGNOSIS — M6281 Muscle weakness (generalized): Secondary | ICD-10-CM | POA: Diagnosis not present

## 2022-04-01 DIAGNOSIS — R278 Other lack of coordination: Secondary | ICD-10-CM | POA: Diagnosis not present

## 2022-04-01 DIAGNOSIS — Z9181 History of falling: Secondary | ICD-10-CM | POA: Diagnosis not present

## 2022-04-06 DIAGNOSIS — M6281 Muscle weakness (generalized): Secondary | ICD-10-CM | POA: Diagnosis not present

## 2022-04-06 DIAGNOSIS — R2681 Unsteadiness on feet: Secondary | ICD-10-CM | POA: Diagnosis not present

## 2022-04-06 DIAGNOSIS — R278 Other lack of coordination: Secondary | ICD-10-CM | POA: Diagnosis not present

## 2022-04-06 DIAGNOSIS — Z9181 History of falling: Secondary | ICD-10-CM | POA: Diagnosis not present

## 2022-04-06 DIAGNOSIS — R2689 Other abnormalities of gait and mobility: Secondary | ICD-10-CM | POA: Diagnosis not present

## 2022-04-07 DIAGNOSIS — R2681 Unsteadiness on feet: Secondary | ICD-10-CM | POA: Diagnosis not present

## 2022-04-07 DIAGNOSIS — R278 Other lack of coordination: Secondary | ICD-10-CM | POA: Diagnosis not present

## 2022-04-07 DIAGNOSIS — Z9181 History of falling: Secondary | ICD-10-CM | POA: Diagnosis not present

## 2022-04-07 DIAGNOSIS — R2689 Other abnormalities of gait and mobility: Secondary | ICD-10-CM | POA: Diagnosis not present

## 2022-04-07 DIAGNOSIS — M6281 Muscle weakness (generalized): Secondary | ICD-10-CM | POA: Diagnosis not present

## 2022-04-08 DIAGNOSIS — Z9181 History of falling: Secondary | ICD-10-CM | POA: Diagnosis not present

## 2022-04-08 DIAGNOSIS — R278 Other lack of coordination: Secondary | ICD-10-CM | POA: Diagnosis not present

## 2022-04-08 DIAGNOSIS — R2681 Unsteadiness on feet: Secondary | ICD-10-CM | POA: Diagnosis not present

## 2022-04-08 DIAGNOSIS — R2689 Other abnormalities of gait and mobility: Secondary | ICD-10-CM | POA: Diagnosis not present

## 2022-04-08 DIAGNOSIS — M6281 Muscle weakness (generalized): Secondary | ICD-10-CM | POA: Diagnosis not present

## 2022-04-13 DIAGNOSIS — R278 Other lack of coordination: Secondary | ICD-10-CM | POA: Diagnosis not present

## 2022-04-13 DIAGNOSIS — R2681 Unsteadiness on feet: Secondary | ICD-10-CM | POA: Diagnosis not present

## 2022-04-13 DIAGNOSIS — Z9181 History of falling: Secondary | ICD-10-CM | POA: Diagnosis not present

## 2022-04-13 DIAGNOSIS — M6281 Muscle weakness (generalized): Secondary | ICD-10-CM | POA: Diagnosis not present

## 2022-04-13 DIAGNOSIS — R2689 Other abnormalities of gait and mobility: Secondary | ICD-10-CM | POA: Diagnosis not present

## 2022-04-14 DIAGNOSIS — Z9181 History of falling: Secondary | ICD-10-CM | POA: Diagnosis not present

## 2022-04-14 DIAGNOSIS — R278 Other lack of coordination: Secondary | ICD-10-CM | POA: Diagnosis not present

## 2022-04-14 DIAGNOSIS — M6281 Muscle weakness (generalized): Secondary | ICD-10-CM | POA: Diagnosis not present

## 2022-04-14 DIAGNOSIS — R2689 Other abnormalities of gait and mobility: Secondary | ICD-10-CM | POA: Diagnosis not present

## 2022-04-14 DIAGNOSIS — R2681 Unsteadiness on feet: Secondary | ICD-10-CM | POA: Diagnosis not present

## 2022-04-15 DIAGNOSIS — R2689 Other abnormalities of gait and mobility: Secondary | ICD-10-CM | POA: Diagnosis not present

## 2022-04-15 DIAGNOSIS — Z9181 History of falling: Secondary | ICD-10-CM | POA: Diagnosis not present

## 2022-04-15 DIAGNOSIS — R278 Other lack of coordination: Secondary | ICD-10-CM | POA: Diagnosis not present

## 2022-04-15 DIAGNOSIS — M6281 Muscle weakness (generalized): Secondary | ICD-10-CM | POA: Diagnosis not present

## 2022-04-15 DIAGNOSIS — R2681 Unsteadiness on feet: Secondary | ICD-10-CM | POA: Diagnosis not present

## 2022-04-16 DIAGNOSIS — Z9181 History of falling: Secondary | ICD-10-CM | POA: Diagnosis not present

## 2022-04-16 DIAGNOSIS — R2681 Unsteadiness on feet: Secondary | ICD-10-CM | POA: Diagnosis not present

## 2022-04-16 DIAGNOSIS — M6281 Muscle weakness (generalized): Secondary | ICD-10-CM | POA: Diagnosis not present

## 2022-04-16 DIAGNOSIS — R2689 Other abnormalities of gait and mobility: Secondary | ICD-10-CM | POA: Diagnosis not present

## 2022-04-16 DIAGNOSIS — R278 Other lack of coordination: Secondary | ICD-10-CM | POA: Diagnosis not present

## 2022-04-22 ENCOUNTER — Other Ambulatory Visit: Payer: Self-pay | Admitting: *Deleted

## 2022-04-22 DIAGNOSIS — M6281 Muscle weakness (generalized): Secondary | ICD-10-CM | POA: Diagnosis not present

## 2022-04-22 DIAGNOSIS — Z9181 History of falling: Secondary | ICD-10-CM | POA: Diagnosis not present

## 2022-04-22 DIAGNOSIS — R2689 Other abnormalities of gait and mobility: Secondary | ICD-10-CM | POA: Diagnosis not present

## 2022-04-22 DIAGNOSIS — R2681 Unsteadiness on feet: Secondary | ICD-10-CM | POA: Diagnosis not present

## 2022-04-22 MED ORDER — TAMOXIFEN CITRATE 20 MG PO TABS: 20.0000 mg | ORAL_TABLET | Freq: Every day | ORAL | 1 refills | Status: DC

## 2022-04-25 ENCOUNTER — Other Ambulatory Visit: Payer: Self-pay | Admitting: Internal Medicine

## 2022-04-30 ENCOUNTER — Other Ambulatory Visit: Payer: Self-pay | Admitting: Nurse Practitioner

## 2022-04-30 ENCOUNTER — Other Ambulatory Visit: Payer: Self-pay | Admitting: Internal Medicine

## 2022-05-05 ENCOUNTER — Other Ambulatory Visit: Payer: Self-pay | Admitting: Internal Medicine

## 2022-05-05 ENCOUNTER — Telehealth: Payer: Self-pay | Admitting: *Deleted

## 2022-05-05 NOTE — Telephone Encounter (Signed)
Patient walked down to the [facility] requesting an appointment. Stated that she woke up last night with palpitations and Urinary Frequency. Patient stated that she wanted to speak with Dr. Lyndel Safe about her Frequency because the medication that she is taking is not working.   Patient stated that she has not had anymore Palpitations nor any other symptoms this morning.   I spoke with Dr. Lyndel Safe about working patient in and an appointment was scheduled for tomorrow at [facility] she stated that patient would be fine until then.   Patient notified and agreed.

## 2022-05-06 ENCOUNTER — Encounter: Payer: Self-pay | Admitting: Nurse Practitioner

## 2022-05-06 ENCOUNTER — Non-Acute Institutional Stay: Payer: MEDICARE | Admitting: Nurse Practitioner

## 2022-05-06 DIAGNOSIS — E7800 Pure hypercholesterolemia, unspecified: Secondary | ICD-10-CM | POA: Diagnosis not present

## 2022-05-06 DIAGNOSIS — I10 Essential (primary) hypertension: Secondary | ICD-10-CM

## 2022-05-06 DIAGNOSIS — I480 Paroxysmal atrial fibrillation: Secondary | ICD-10-CM

## 2022-05-06 DIAGNOSIS — R002 Palpitations: Secondary | ICD-10-CM

## 2022-05-06 DIAGNOSIS — I25119 Atherosclerotic heart disease of native coronary artery with unspecified angina pectoris: Secondary | ICD-10-CM

## 2022-05-06 DIAGNOSIS — R350 Frequency of micturition: Secondary | ICD-10-CM | POA: Diagnosis not present

## 2022-05-06 DIAGNOSIS — K219 Gastro-esophageal reflux disease without esophagitis: Secondary | ICD-10-CM

## 2022-05-06 DIAGNOSIS — Z853 Personal history of malignant neoplasm of breast: Secondary | ICD-10-CM | POA: Diagnosis not present

## 2022-05-06 DIAGNOSIS — I5032 Chronic diastolic (congestive) heart failure: Secondary | ICD-10-CM | POA: Diagnosis not present

## 2022-05-06 NOTE — Assessment & Plan Note (Addendum)
Palpitation episode at night, lasted about 5-10 minutes, resolved w/o intervention, denied chest pain/pressure, SOB, cough, diaphoresis, or impending sense of doom associated with the event. The patient stated it was the first time she experienced the symptom. Highly suspect Afib with RVR.  Recommended to f/u Cardiology, ED if recurs or chest pain/pressure, SOB, focal weakness develops.

## 2022-05-06 NOTE — Assessment & Plan Note (Signed)
Hx of breast cancer, Mammogram. On Tamoxifen.

## 2022-05-06 NOTE — Assessment & Plan Note (Signed)
C/o frequency about 2x/night at her baseline,  denied burning sensation or urinary urgency, recommended to take Furosemide early in am possible.

## 2022-05-06 NOTE — Assessment & Plan Note (Signed)
taking Atorvastatin.  

## 2022-05-06 NOTE — Progress Notes (Addendum)
Location:   clinic [facility]   Place of Service:  Clinic (12) Provider: Marlana Latus NP  Code Status: DNR Goals of Care: [place]    10/12/2021    8:42 AM  Advanced Directives  Does Patient Have a Medical Advance Directive? Yes  Type of Advance Directive Out of facility DNR (pink MOST or yellow form)  Does patient want to make changes to medical advance directive? No - Patient declined     Chief Complaint  Patient presents with   Acute Visit    Patient is being seen for Urinary frequency and palpitations. Would like to discuss lasix adjustment     HPI: Patient is a 88 y.o. female seen today for episode of palpitation at night, urinary frequency about 2x/night.  Palpitation episode at night, lasted about 5-10 minutes, resolved w/o intervention, denied chest pain/pressure, SOB, cough, diaphoresis, or impending sense of doom associated with the event. The patient stated it was the first time she experience the symptom.  C/o frequency about 2x/night at her baseline,  denied burning sensation or urinary urgency  Hx of CHF, on Furosemide, followed by Cardiology Hx of Afib, on Metoprolol,  Eliquis '5mg'$  bid since her Hgb 10s. HLD taking Atorvastatin Prediabetes, diet controlled.  HTN, takes Metoprolol, Bun/creat 40/1.16 09/15/21 Seasonal allergies, takes Singulair, Atrovent GERD takes Pantoprazole, Hgb 11.5 09/15/21 Hx of breast cancer, Mammogram. On Tamoxifen.     Past Medical History:  Diagnosis Date   Allergic rhinitis, seasonal    Breast cancer of upper-outer quadrant of right female breast ([place]) 09/13/2014   Chronic venous insufficiency    Dermatophytosis of nail    Dysrhythmia    H/o Atrial fibrillation   Edema leg    Family history of malignant neoplasm of breast    Family history of malignant neoplasm of ovary    Goiter    right thyroid nodule    HTN (hypertension) 08/13/2017   HX: breast cancer    bilateral   Menopausal syndrome    Osteoarthritis    Osteoporosis    Squamous  cell skin cancer    right lower leg   Wears glasses    Wears hearing aid    both ears    Past Surgical History:  Procedure Laterality Date   bilateral lumpectomies for breast cancer  Aug. 2007   BREAST LUMPECTOMY WITH RADIOACTIVE SEED LOCALIZATION Right 10/01/2014   Procedure: BREAST LUMPECTOMY WITH RADIOACTIVE SEED LOCALIZATION;  Surgeon: Erroll Luna, MD;  Location: [facility];  Service: General;  Laterality: Right;   BREAST LUMPECTOMY WITH RADIOACTIVE SEED LOCALIZATION Left 03/22/2019   Procedure: LEFT BREAST LUMPECTOMY WITH RADIOACTIVE SEED LOCALIZATION;  Surgeon: Erroll Luna, MD;  Location: [facility];  Service: General;  Laterality: Left;   BREAST LUMPECTOMY WITH RADIOACTIVE SEED LOCALIZATION Right 08/18/2021   Procedure: RIGHT BREAST LUMPECTOMY WITH RADIOACTIVE SEED LOCALIZATION;  Surgeon: Erroll Luna, MD;  Location: [facility];  Service: General;  Laterality: Right;   CATARACT EXTRACTION     CHOLECYSTECTOMY N/A 08/13/2017   Procedure: LAPAROSCOPIC CHOLECYSTECTOMY WITH INTRAOPERATIVE CHOLANGIOGRAM;  Surgeon: Excell Seltzer, MD;  Location: [facility];  Service: General;  Laterality: N/A;   DILATION AND CURETTAGE OF UTERUS     ESOPHAGOGASTRODUODENOSCOPY  07/26/2011   Procedure: ESOPHAGOGASTRODUODENOSCOPY (EGD);  Surgeon: Jeryl Columbia, MD;  Location: [facility];  Service: Endoscopy;  Laterality: N/A;   history of lumbar compression fracture     left eardum sx  1983   left knee arthroscopic surgery     no  screening colonoscopy     s/p bilateral lumpectomies     SAVORY DILATION  07/26/2011   Procedure: SAVORY DILATION;  Surgeon: Jeryl Columbia, MD;  Location: [facility];  Service: Endoscopy;  Laterality: N/A;   status post resection squamous cell cancer right lower leg     TONSILLECTOMY     UMBILICAL HERNIA REPAIR      Allergies  Allergen Reactions   Celecoxib Swelling    Swelling-leg    Allergies as of 05/06/2022       Reactions   Celecoxib Swelling    Swelling-leg        Medication List        Accurate as of May 06, 2022 11:59 PM. If you have any questions, ask your nurse or doctor.          apixaban 5 MG Tabs tablet Commonly known as: Eliquis Take 1 tablet (5 mg total) by mouth 2 (two) times daily.   atorvastatin 40 MG tablet Commonly known as: LIPITOR TAKE 1 TABLET BY MOUTH EVERY DAY   furosemide 40 MG tablet Commonly known as: LASIX TAKE 1 TABLET BY MOUTH EVERY DAY   hydroxypropyl methylcellulose / hypromellose 2.5 % ophthalmic solution Commonly known as: ISOPTO TEARS / GONIOVISC Place 1 drop into both eyes in the morning and at bedtime.   ipratropium 0.06 % nasal spray Commonly known as: ATROVENT PLACE 2 SPRAYS INTO BOTH NOSTRILS 4 TIMES A DAY   ketoconazole 2 % cream Commonly known as: NIZORAL Apply 1 application. topically 2 (two) times daily as needed for irritation.   Klor-Con M20 20 MEQ tablet Generic drug: potassium chloride SA TAKE 1 TABLET BY MOUTH EVERY DAY   metoprolol succinate 25 MG 24 hr tablet Commonly known as: TOPROL-XL TAKE 1 TABLET BY MOUTH EVERYDAY AT BEDTIME   montelukast 10 MG tablet Commonly known as: SINGULAIR TAKE 1 TABLET BY MOUTH AT NIGHT FOR COUGH OR WHEEZING   pantoprazole 40 MG tablet Commonly known as: PROTONIX TAKE 1 TABLET BY MOUTH EVERY DAY   PreserVision AREDS 2 Caps Take 1 capsule by mouth 2 (two) times daily.   tamoxifen 20 MG tablet Commonly known as: NOLVADEX Take 1 tablet (20 mg total) by mouth daily.        Review of Systems:  Review of Systems  Constitutional:  Negative for appetite change, fatigue and fever.  HENT:  Positive for hearing loss. Negative for trouble swallowing.   Respiratory:  Negative for cough, chest tightness, shortness of breath and wheezing.   Cardiovascular:  Positive for palpitations and leg swelling. Negative for chest pain.  Gastrointestinal:  Negative for abdominal pain and constipation.  Genitourinary:  Positive for  frequency. Negative for dysuria and urgency.  Musculoskeletal:  Positive for arthralgias and gait problem.  Skin:  Negative for color change.  Neurological:  Negative for weakness and light-headedness.  Psychiatric/Behavioral:  Negative for confusion and sleep disturbance. The patient is not nervous/anxious.     Health Maintenance  Topic Date Due   Zoster Vaccines- Shingrix (1 of 2) Never done   COVID-19 Vaccine (5 - Moderna risk series) 06/03/2021   INFLUENZA VACCINE  02/16/2022   TETANUS/TDAP  05/04/2023   Pneumonia Vaccine 65+ Years old  Completed   DEXA SCAN  Completed   HPV VACCINES  Aged Out   MAMMOGRAM  Discontinued    Physical Exam: Vitals:   05/06/22 1411  BP: (!) 140/70  Pulse: 81  Resp: 18  Temp: 98 F (36.7 C)  SpO2:  94%  Weight: 167 lb (75.8 kg)  Height: '5\' 3"'$  (1.6 m)   Body mass index is 29.58 kg/m. Physical Exam Vitals and nursing note reviewed.  Constitutional:      Appearance: Normal appearance.  HENT:     Head: Normocephalic and atraumatic.     Nose: Nose normal.     Mouth/Throat:     Mouth: Mucous membranes are moist.  Eyes:     Extraocular Movements: Extraocular movements intact.     Conjunctiva/sclera: Conjunctivae normal.     Pupils: Pupils are equal, round, and reactive to light.  Cardiovascular:     Rate and Rhythm: Normal rate. Rhythm irregular.  Pulmonary:     Effort: Pulmonary effort is normal.     Breath sounds: No rales.  Abdominal:     General: Bowel sounds are normal.     Palpations: Abdomen is soft.     Tenderness: There is no abdominal tenderness.  Musculoskeletal:     Cervical back: Normal range of motion and neck supple.     Right lower leg: Edema present.     Left lower leg: Edema present.     Comments: 1+ edema BLE  Skin:    General: Skin is warm and dry.     Comments:    Neurological:     General: No focal deficit present.     Mental Status: She is alert and oriented to person, place, and time. Mental status is at  baseline.     Motor: No weakness.     Coordination: Coordination normal.     Gait: Gait abnormal.  Psychiatric:        Mood and Affect: Mood normal.        Behavior: Behavior normal.        Thought Content: Thought content normal.        Judgment: Judgment normal.     Labs reviewed: Basic Metabolic Panel: Recent Labs    05/14/21 1035 08/07/21 1409 09/15/21 1255  NA 144 144 143  K 4.2 4.1 4.0  CL 108 110 108  CO2 '25 25 29  '$ GLUCOSE 94 112* 102*  BUN 35* 32* 40*  CREATININE 1.06* 1.12* 1.16*  CALCIUM 9.5 9.0 9.0   Liver Function Tests: Recent Labs    05/14/21 1035 09/15/21 1255  AST 17 14*  ALT 15 12  ALKPHOS 111 108  BILITOT 0.5 0.4  PROT 6.6 6.5  ALBUMIN 3.6 3.8   No results for input(s): "LIPASE", "AMYLASE" in the last 8760 hours. No results for input(s): "AMMONIA" in the last 8760 hours. CBC: Recent Labs    05/14/21 1035 08/07/21 1409 09/15/21 1255  WBC 6.6 6.0 6.4  NEUTROABS 5.2  --  4.8  HGB 12.0 12.2 11.5*  HCT 37.6 38.3 34.9*  MCV 93.8 94.3 93.3  PLT 200 182 172   Lipid Panel: No results for input(s): "CHOL", "HDL", "LDLCALC", "TRIG", "CHOLHDL", "LDLDIRECT" in the last 8760 hours. Lab Results  Component Value Date   HGBA1C 5.8 (H) 01/01/2021    Procedures since last visit: No results found.  Assessment/Plan  Chronic diastolic heart failure (HCC)  on Furosemide, followed by Cardiology  Paroxysmal atrial fibrillation ([place])  on Metoprolol,  Eliquis '5mg'$  bid since her Hgb 10s  Hypercholesterolemia taking Atorvastatin  Essential hypertension Blood pressure is controlled,  takes Metoprolol, Bun/creat 40/1.16 09/15/21  GERD (gastroesophageal reflux disease) Stable, takes Pantoprazole, Hgb 11.5 09/15/21  BREAST CANCER, HX OF Hx of breast cancer, Mammogram. On Tamoxifen.   Palpitation Palpitation  episode at night, lasted about 5-10 minutes, resolved w/o intervention, denied chest pain/pressure, SOB, cough, diaphoresis, or impending sense  of doom associated with the event. The patient stated it was the first time she experienced the symptom. Highly suspect Afib with RVR.  Recommended to f/u Cardiology, ED if recurs or chest pain/pressure, SOB, focal weakness develops.   Urinary frequency C/o frequency about 2x/night at her baseline,  denied burning sensation or urinary urgency, recommended to take Furosemide early in am possible.     Labs/tests ordered:  none  Next appt:  10/11/2022

## 2022-05-06 NOTE — Assessment & Plan Note (Signed)
Blood pressure is controlled,  takes Metoprolol, Bun/creat 40/1.16 09/15/21

## 2022-05-06 NOTE — Assessment & Plan Note (Signed)
on Metoprolol,  Eliquis '5mg'$  bid since her Hgb 10s

## 2022-05-06 NOTE — Assessment & Plan Note (Signed)
on Furosemide, followed by Cardiology

## 2022-05-06 NOTE — Assessment & Plan Note (Signed)
Stable, takes Pantoprazole, Hgb 11.5 09/15/21

## 2022-05-07 ENCOUNTER — Encounter: Payer: Self-pay | Admitting: Nurse Practitioner

## 2022-05-10 NOTE — Progress Notes (Signed)
Cardiology Office Note:    Date:  05/12/2022   ID:  [name], DOB 12/29/1921, MRN [id number]  PCP:  Virgie Dad, MD   [place] Providers Cardiologist:  Sanda Klein, MD Cardiology APP:  Ledora Bottcher, [place] {    Referring MD: Virgie Dad, MD   Chief Complaint  Patient presents with   Follow-up    Rapid heart rate    History of Present Illness:    [name] is a 88 y.o. female with a hx of PAF with troponin elevation and brief congestive heart failure following a cholecystectomy surgery in January 2019.  She is maintained on 40 mg Lasix daily and 25 mg Toprol.  Anticoagulated with Eliquis.  Hyperlipidemia managed with 40 mg Lipitor.  Echocardiogram January 2019 showed an LVEF 55 to 47%, grade 2 diastolic dysfunction, no regional wall motion abnormality, mild aortic stenosis with mild to moderate regurgitation directed centrally in the LVOT.  She has moderate to severe MAC with moderate MR and moderate TR.  She had increased PASP to 59 mmHg. small pericardial effusion.  Echocardiogram was repeated 08/26/2017 and showed new apical akinesis but otherwise stable.  No pericardial effusion was seen.  Chronic HFpEF is managed with scheduled 40 mg Lasix.  She was seen by her PCP 05/06/2022 after an episode of palpitations at night that lasted about 5 to 10 minutes.  Palpitations resolved spontaneously.  She denies chest pain, shortness of breath, and lower extremity swelling.  She does wake up to urinate 2 times per night and this is her baseline.  She denies symptoms of UTI.  Cardiology follow-up was recommended and she was placed on my schedule today.  She has one episode of heart racing for less than 10 minutes.  She is here because she is concerned about this.  She is in sinus rhythm today.  We discussed prior A-fib and that she is protected from stroke with Eliquis.  She will confirm that she is taking Eliquis twice daily (pills managed by her 60 yo  husband).  I will check a battery of labs today including electrolytes, TSH, and UA (no labs drawn with PCP). She denies recent illness.    Past Medical History:  Diagnosis Date   Allergic rhinitis, seasonal    Breast cancer of upper-outer quadrant of right female breast ([place]) 09/13/2014   Chronic venous insufficiency    Dermatophytosis of nail    Dysrhythmia    H/o Atrial fibrillation   Edema leg    Family history of malignant neoplasm of breast    Family history of malignant neoplasm of ovary    Goiter    right thyroid nodule    HTN (hypertension) 08/13/2017   HX: breast cancer    bilateral   Menopausal syndrome    Osteoarthritis    Osteoporosis    Squamous cell skin cancer    right lower leg   Wears glasses    Wears hearing aid    both ears    Past Surgical History:  Procedure Laterality Date   bilateral lumpectomies for breast cancer  Aug. 2007   BREAST LUMPECTOMY WITH RADIOACTIVE SEED LOCALIZATION Right 10/01/2014   Procedure: BREAST LUMPECTOMY WITH RADIOACTIVE SEED LOCALIZATION;  Surgeon: Erroll Luna, MD;  Location: [facility];  Service: General;  Laterality: Right;   BREAST LUMPECTOMY WITH RADIOACTIVE SEED LOCALIZATION Left 03/22/2019   Procedure: LEFT BREAST LUMPECTOMY WITH RADIOACTIVE SEED LOCALIZATION;  Surgeon: Erroll Luna, MD;  [facility];  Service: General;  Laterality: Left;   BREAST LUMPECTOMY WITH RADIOACTIVE SEED LOCALIZATION Right 08/18/2021   Procedure: RIGHT BREAST LUMPECTOMY WITH RADIOACTIVE SEED LOCALIZATION;  Surgeon: Erroll Luna, MD;  Location: [facility];  Service: General;  Laterality: Right;   CATARACT EXTRACTION     CHOLECYSTECTOMY N/A 08/13/2017   Procedure: LAPAROSCOPIC CHOLECYSTECTOMY WITH INTRAOPERATIVE CHOLANGIOGRAM;  Surgeon: Excell Seltzer, MD;  Location: [facility];  Service: General;  Laterality: N/A;   DILATION AND CURETTAGE OF UTERUS     ESOPHAGOGASTRODUODENOSCOPY  07/26/2011   Procedure: ESOPHAGOGASTRODUODENOSCOPY  (EGD);  Surgeon: Jeryl Columbia, MD;  Location: [facility];  Service: Endoscopy;  Laterality: N/A;   history of lumbar compression fracture     left eardum sx  1983   left knee arthroscopic surgery     no screening colonoscopy     s/p bilateral lumpectomies     SAVORY DILATION  07/26/2011   Procedure: SAVORY DILATION;  Surgeon: Jeryl Columbia, MD;  Location: [facility];  Service: Endoscopy;  Laterality: N/A;   status post resection squamous cell cancer right lower leg     TONSILLECTOMY     UMBILICAL HERNIA REPAIR      Current Medications: Current Meds  Medication Sig   apixaban (ELIQUIS) 5 MG TABS tablet Take 1 tablet (5 mg total) by mouth 2 (two) times daily.   atorvastatin (LIPITOR) 40 MG tablet TAKE 1 TABLET BY MOUTH EVERY DAY   furosemide (LASIX) 40 MG tablet TAKE 1 TABLET BY MOUTH EVERY DAY   hydroxypropyl methylcellulose / hypromellose (ISOPTO TEARS / GONIOVISC) 2.5 % ophthalmic solution Place 1 drop into both eyes in the morning and at bedtime.   ipratropium (ATROVENT) 0.06 % nasal spray PLACE 2 SPRAYS INTO BOTH NOSTRILS 4 TIMES A DAY   ketoconazole (NIZORAL) 2 % cream Apply 1 application. topically 2 (two) times daily as needed for irritation.   KLOR-CON M20 20 MEQ tablet TAKE 1 TABLET BY MOUTH EVERY DAY   metoprolol succinate (TOPROL-XL) 25 MG 24 hr tablet TAKE 1 TABLET BY MOUTH EVERYDAY AT BEDTIME   metoprolol tartrate (LOPRESSOR) 25 MG tablet Take 1 tablet (25 mg total) by mouth as needed. For Palpitations more than 30 minutes   montelukast (SINGULAIR) 10 MG tablet TAKE 1 TABLET BY MOUTH AT NIGHT FOR COUGH OR WHEEZING   Multiple Vitamins-Minerals (PRESERVISION AREDS 2) CAPS Take 1 capsule by mouth 2 (two) times daily.    pantoprazole (PROTONIX) 40 MG tablet TAKE 1 TABLET BY MOUTH EVERY DAY   tamoxifen (NOLVADEX) 20 MG tablet Take 1 tablet (20 mg total) by mouth daily.     Allergies:   Celecoxib   Social History   Socioeconomic History   Marital status: Married    Spouse  name: [name]   Number of children: Not on file   Years of education: Not on file   Highest education level: Not on file  Occupational History   Not on file  Tobacco Use   Smoking status: Never   Smokeless tobacco: Never  Vaping Use   Vaping Use: Never used  Substance and Sexual Activity   Alcohol use: No   Drug use: No   Sexual activity: Not Currently  Other Topics Concern   Not on file  Social History Narrative   Pt and her husband are Active members of [place] and have support from this community. No Children.    Social Determinants of Health   Financial Resource Strain: Low Risk  (10/12/2021)  Overall Financial Resource Strain (CARDIA)    Difficulty of Paying Living Expenses: Not hard at all  Food Insecurity: No Food Insecurity (10/12/2021)   Hunger Vital Sign    Worried About Running Out of Food in the Last Year: Never true    Ran Out of Food in the Last Year: Never true  Transportation Needs: No Transportation Needs (10/12/2021)   PRAPARE - Hydrologist (Medical): No    Lack of Transportation (Non-Medical): No  Physical Activity: Insufficiently Active (10/12/2021)   Exercise Vital Sign    Days of Exercise per Week: 7 days    Minutes of Exercise per Session: 20 min  Stress: No Stress Concern Present (10/12/2021)   [place]    Feeling of Stress : Only a little  Social Connections: Socially Integrated (10/12/2021)   Social Connection and Isolation Panel [NHANES]    Frequency of Communication with Friends and Family: More than three times a week    Frequency of Social Gatherings with Friends and Family: More than three times a week    Attends Religious Services: More than 4 times per year    Active Member of [employer] or Organizations: Yes    Attends Archivist Meetings: Never    Marital Status: Married     Family History: The patient's family  history includes Cancer in an other family member; Cancer (age of onset: 65) in her maternal aunt; Cancer (age of onset: 58) in her maternal aunt; Cancer (age of onset: 11) in her mother; Coronary artery disease in an other family member; Heart attack in her brother, brother, and father; Heart disease in her brother.  ROS:   Please see the history of present illness.     All other systems reviewed and are negative.  EKGs/Labs/Other Studies Reviewed:    The following studies were reviewed today:  Echo 08/2017: Study Conclusions   - Left ventricle: The cavity size was normal. Wall thickness was    increased in a pattern of mild LVH. Systolic function was normal.    The estimated ejection fraction was in the range of 50% to 55%.    There is akinesis of the apical myocardium. Features are    consistent with a pseudonormal left ventricular filling pattern,    with concomitant abnormal relaxation and increased filling    pressure (grade 2 diastolic dysfunction).  - Aortic valve: Trileaflet; mildly thickened, mildly calcified    leaflets. There was mild regurgitation.  - Mitral valve: Severely calcified annulus. Mildly thickened,    mildly calcified leaflets . There was moderate regurgitation.  - Left atrium: The atrium was mildly to moderately dilated.    Volume/bsa, S: 43.1 ml/m^2.  - Tricuspid valve: There was moderate regurgitation.  - Pulmonary arteries: Systolic pressure was moderately increased.   EKG:  EKG is  ordered today.  The ekg ordered today demonstrates sinus rhythm HR 78, PACs, septal Q waves  Recent Labs: 09/15/2021: ALT 12; BUN 40; Creatinine 1.16; Hemoglobin 11.5; Platelet Count 172; Potassium 4.0; Sodium 143  Recent Lipid Panel    Component Value Date/Time   CHOL 145 01/01/2021 0800   CHOL 109 09/19/2017 0000   TRIG 114 01/01/2021 0800   TRIG 107 09/19/2017 0000   HDL 50 01/01/2021 0800   CHOLHDL 2.9 01/01/2021 0800   VLDL 17.8 10/06/2018 0946   LDLCALC 75  01/01/2021 0800   LDLCALC 59 09/19/2017 0000   LDLDIRECT 173.9 06/25/2013  0948     Risk Assessment/Calculations:    CHA2DS2-VASc Score = 6   This indicates a 9.7% annual risk of stroke. The patient's score is based upon:      Physical Exam:    VS:  BP (!) 140/48   Pulse 78   Ht _0  (1.6 m)   Wt 169 lb 9.6 oz (76.9 kg)   SpO2 (!) 78%   BMI 30.04 kg/m     Wt Readings from Last 3 Encounters:  05/12/22 169 lb 9.6 oz (76.9 kg)  05/06/22 167 lb (75.8 kg)  01/13/22 164 lb 1.6 oz (74.4 kg)     GEN:  Well nourished, well developed in no acute distress HEENT: Normal NECK: No JVD; No carotid bruits LYMPHATICS: No lymphadenopathy CARDIAC: RRR, no murmurs, rubs, gallops RESPIRATORY:  Clear to auscultation without rales, wheezing or rhonchi  ABDOMEN: Soft, non-tender, non-distended MUSCULOSKELETAL:  No edema; No deformity  SKIN: Warm and dry NEUROLOGIC:  Alert and oriented x 3 PSYCHIATRIC:  Normal affect   ASSESSMENT:    1. Paroxysmal atrial fibrillation (HCC)   2. Palpitation   3. Chronic diastolic heart failure ([place])   4. Chronic anticoagulation   5. Essential hypertension   6. Hypercholesterolemia    PLAN:    In order of problems listed above:  Palpitations Patient experienced 5 to 10 minutes of palpitations that woke her from sleep.  Thankfully these resolved spontaneously. I suspect she had a bout of A-fib RVR.  She is maintained on Toprol and has done quite well with this.  She denies recent illness.  She denies symptoms of UTI but I will check a UA just in case.  I will also check electrolytes and TSH. I provided 25 mg metoprolol tartrate to be taken as needed for palpitations lasting longer than 30 minutes. If she does take this, I advised resting the next day.   Chronic diastolic dysfunction She is concerned about weight gain although she is basing this off of our scales and not her home weight.  We discussed daily weights at home.  We also discussed  low-sodium diet.  She has been eating soup at her assisted living.  I think increasing her protein intake is a good idea based on February labs.  We will recheck these today. She is concerned about hypervolemia. She is maintained on 40 mg lasix daily with 20 mEq potassium.   PAF Chronic anticoagulation She is maintained on beta-blocker and Eliquis, dosed correctly   HFpEF 40 mg Lasix daily   Hypertension No medication changes   Hyperlipidemia Managed with 40 mg Crestor I do not feel strongly about updating a lipid panel   Call Rita Ohara with results   Follow up in 3 months.       Medication Adjustments/Labs and Tests Ordered: Current medicines are reviewed at length with the patient today.  Concerns regarding medicines are outlined above.  Orders Placed This Encounter  Procedures   Basic metabolic panel   Brain natriuretic peptide   Magnesium   TSH   CBC   UA/M w/rflx Culture, Routine   EKG 12-Lead   Meds ordered this encounter  Medications   metoprolol tartrate (LOPRESSOR) 25 MG tablet    Sig: Take 1 tablet (25 mg total) by mouth as needed. For Palpitations more than 30 minutes    Dispense:  60 tablet    Refill:  2    Patient Instructions  Medication Instructions:  Metoprolol Tartrate 25 mg ( Take 1 Tablet  As Needed for Palpitations More Than 30 Minutes). Eliquis 5 mg ( Take 1 Tablet Twice Daily). *If you need a refill on your cardiac medications before your next appointment, please call your pharmacy*   Lab Work: BMET, BNP, CBC, Magnesium Level, TSH, U/Awith Relfex If you have labs (blood work) drawn today and your tests are completely normal, you will receive your results only by: [place] (if you have MyChart) OR A paper copy in the mail If you have any lab test that is abnormal or we need to change your treatment, we will call you to review the results.   Testing/Procedures: No Testing   Follow-Up: At [facility], you and  your health needs are our priority.  As part of our continuing mission to provide you with exceptional heart care, we have created designated Provider Care Teams.  These Care Teams include your primary Cardiologist (physician) and Advanced Practice Providers (APPs -  Physician Assistants and Nurse Practitioners) who all work together to provide you with the care you need, when you need it.  We recommend signing up for the patient portal called "MyChart".  Sign up information is provided on this After Visit Summary.  MyChart is used to connect with patients for Virtual Visits (Telemedicine).  Patients are able to view lab/test results, encounter notes, upcoming appointments, etc.  Non-urgent messages can be sent to your provider as well.   To learn more about what you can do with MyChart, go to [email or web address].    Your next appointment:   3 month(s)  The format for your next appointment:   In Person  Provider:   Sanda Klein, MD     Other Instructions Increase protein intake. Consider Fair Life Protein Drink.    Signed, Ledora Bottcher, PA  05/12/2022 4:40 PM    [facility]

## 2022-05-12 ENCOUNTER — Ambulatory Visit: Payer: MEDICARE | Attending: Physician Assistant | Admitting: Physician Assistant

## 2022-05-12 ENCOUNTER — Encounter: Payer: Self-pay | Admitting: Physician Assistant

## 2022-05-12 ENCOUNTER — Other Ambulatory Visit: Payer: Self-pay | Admitting: Physician Assistant

## 2022-05-12 VITALS — BP 140/48 | HR 78 | Ht 63.0 in | Wt 169.6 lb

## 2022-05-12 DIAGNOSIS — Z7901 Long term (current) use of anticoagulants: Secondary | ICD-10-CM | POA: Diagnosis not present

## 2022-05-12 DIAGNOSIS — I480 Paroxysmal atrial fibrillation: Secondary | ICD-10-CM

## 2022-05-12 DIAGNOSIS — E7800 Pure hypercholesterolemia, unspecified: Secondary | ICD-10-CM

## 2022-05-12 DIAGNOSIS — I10 Essential (primary) hypertension: Secondary | ICD-10-CM

## 2022-05-12 DIAGNOSIS — I5032 Chronic diastolic (congestive) heart failure: Secondary | ICD-10-CM | POA: Diagnosis not present

## 2022-05-12 DIAGNOSIS — R002 Palpitations: Secondary | ICD-10-CM | POA: Diagnosis not present

## 2022-05-12 DIAGNOSIS — R000 Tachycardia, unspecified: Secondary | ICD-10-CM | POA: Diagnosis not present

## 2022-05-12 MED ORDER — METOPROLOL TARTRATE 25 MG PO TABS: 25.0000 mg | ORAL_TABLET | ORAL | 2 refills | Status: DC | PRN

## 2022-05-12 NOTE — Patient Instructions (Signed)
Medication Instructions:  Metoprolol Tartrate 25 mg ( Take 1 Tablet As Needed for Palpitations More Than 30 Minutes). Eliquis 5 mg ( Take 1 Tablet Twice Daily). *If you need a refill on your cardiac medications before your next appointment, please call your pharmacy*   Lab Work: BMET, BNP, CBC, Magnesium Level, TSH, U/Awith Relfex If you have labs (blood work) drawn today and your tests are completely normal, you will receive your results only by: [place] (if you have MyChart) OR A paper copy in the mail If you have any lab test that is abnormal or we need to change your treatment, we will call you to review the results.   Testing/Procedures: No Testing   Follow-Up: At [facility], you and your health needs are our priority.  As part of our continuing mission to provide you with exceptional heart care, we have created designated Provider Care Teams.  These Care Teams include your primary Cardiologist (physician) and Advanced Practice Providers (APPs -  Physician Assistants and Nurse Practitioners) who all work together to provide you with the care you need, when you need it.  We recommend signing up for the patient portal called "MyChart".  Sign up information is provided on this After Visit Summary.  MyChart is used to connect with patients for Virtual Visits (Telemedicine).  Patients are able to view lab/test results, encounter notes, upcoming appointments, etc.  Non-urgent messages can be sent to your provider as well.   To learn more about what you can do with MyChart, go to [email or web address].    Your next appointment:   3 month(s)  The format for your next appointment:   In Person  Provider:   Sanda Klein, MD     Other Instructions Increase protein intake. Consider Fair Life Protein Drink.

## 2022-05-13 ENCOUNTER — Telehealth: Payer: Self-pay | Admitting: Cardiovascular Disease

## 2022-05-13 NOTE — Telephone Encounter (Signed)
Patient's husband is requesting to review medication instructions from [place], [place]. He states she advised to take a little white pill in the evening instead of the morning, but he does not remember the name of it.

## 2022-05-13 NOTE — Telephone Encounter (Signed)
Note from appointment 05/12/22  Metoprolol Tartrate 25 mg ( Take 1 Tablet As Needed for Palpitations More Than 30 Minutes). Eliquis 5 mg ( Take 1 Tablet Twice Daily).

## 2022-05-13 NOTE — Telephone Encounter (Signed)
Spoke to patient's husband and  patient.they need to clarify how to take medication.  Clarification only taking metoprolol tartrate if needed- palpation are greater than 30 minutes take the tartrate. They are aware to continue metoprolol succinate  25 mg at bedtime  Both verbalized understanding.

## 2022-05-15 LAB — CBC
Hematocrit: 34.6 % (ref 34.0–46.6)
Hemoglobin: 11.4 g/dL (ref 11.1–15.9)
MCH: 29.8 pg (ref 26.6–33.0)
MCHC: 32.9 g/dL (ref 31.5–35.7)
MCV: 90 fL (ref 79–97)
Platelets: 182 10*3/uL (ref 150–450)
RBC: 3.83 x10E6/uL (ref 3.77–5.28)
RDW: 12.2 % (ref 11.7–15.4)
WBC: 6.6 10*3/uL (ref 3.4–10.8)

## 2022-05-15 LAB — UA/M W/RFLX CULTURE, ROUTINE
Bilirubin, UA: NEGATIVE
Glucose, UA: NEGATIVE
Ketones, UA: NEGATIVE
Nitrite, UA: NEGATIVE
Protein,UA: NEGATIVE
RBC, UA: NEGATIVE
Specific Gravity, UA: 1.011 (ref 1.005–1.030)
Urobilinogen, Ur: 0.2 mg/dL (ref 0.2–1.0)
pH, UA: 6 (ref 5.0–7.5)

## 2022-05-15 LAB — BASIC METABOLIC PANEL
BUN/Creatinine Ratio: 29 — ABNORMAL HIGH (ref 12–28)
BUN: 35 mg/dL (ref 10–36)
CO2: 25 mmol/L (ref 20–29)
Calcium: 9 mg/dL (ref 8.7–10.3)
Chloride: 104 mmol/L (ref 96–106)
Creatinine, Ser: 1.22 mg/dL — ABNORMAL HIGH (ref 0.57–1.00)
Glucose: 128 mg/dL — ABNORMAL HIGH (ref 70–99)
Potassium: 4.6 mmol/L (ref 3.5–5.2)
Sodium: 142 mmol/L (ref 134–144)
eGFR: 40 mL/min/{1.73_m2} — ABNORMAL LOW (ref >59)

## 2022-05-15 LAB — URINE CULTURE, REFLEX

## 2022-05-15 LAB — MICROSCOPIC EXAMINATION
Bacteria, UA: NONE SEEN
Casts: NONE SEEN /lpf
RBC, Urine: NONE SEEN /hpf (ref 0–2)

## 2022-05-15 LAB — TSH: TSH: 3.16 u[IU]/mL (ref 0.450–4.500)

## 2022-05-15 LAB — BRAIN NATRIURETIC PEPTIDE: BNP: 233.4 pg/mL — ABNORMAL HIGH (ref 0.0–100.0)

## 2022-05-15 LAB — MAGNESIUM: Magnesium: 1.8 mg/dL (ref 1.6–2.3)

## 2022-05-17 DIAGNOSIS — B029 Zoster without complications: Secondary | ICD-10-CM | POA: Diagnosis not present

## 2022-05-17 DIAGNOSIS — L245 Irritant contact dermatitis due to other chemical products: Secondary | ICD-10-CM | POA: Diagnosis not present

## 2022-05-19 ENCOUNTER — Other Ambulatory Visit: Payer: MEDICARE

## 2022-05-19 ENCOUNTER — Ambulatory Visit: Payer: MEDICARE | Admitting: Hematology and Oncology

## 2022-05-26 ENCOUNTER — Encounter: Payer: Self-pay | Admitting: *Deleted

## 2022-05-27 DIAGNOSIS — R1312 Dysphagia, oropharyngeal phase: Secondary | ICD-10-CM | POA: Diagnosis not present

## 2022-06-01 DIAGNOSIS — R1312 Dysphagia, oropharyngeal phase: Secondary | ICD-10-CM | POA: Diagnosis not present

## 2022-06-02 ENCOUNTER — Encounter: Payer: Self-pay | Admitting: Internal Medicine

## 2022-06-02 ENCOUNTER — Non-Acute Institutional Stay: Payer: MEDICARE | Admitting: Internal Medicine

## 2022-06-02 VITALS — BP 150/68 | HR 65 | Temp 98.1°F | Resp 17 | Ht 63.0 in | Wt 165.6 lb

## 2022-06-02 DIAGNOSIS — E7800 Pure hypercholesterolemia, unspecified: Secondary | ICD-10-CM | POA: Diagnosis not present

## 2022-06-02 DIAGNOSIS — I480 Paroxysmal atrial fibrillation: Secondary | ICD-10-CM

## 2022-06-02 DIAGNOSIS — Z853 Personal history of malignant neoplasm of breast: Secondary | ICD-10-CM | POA: Diagnosis not present

## 2022-06-02 DIAGNOSIS — I10 Essential (primary) hypertension: Secondary | ICD-10-CM | POA: Diagnosis not present

## 2022-06-02 DIAGNOSIS — J3089 Other allergic rhinitis: Secondary | ICD-10-CM | POA: Diagnosis not present

## 2022-06-02 DIAGNOSIS — B351 Tinea unguium: Secondary | ICD-10-CM

## 2022-06-02 DIAGNOSIS — I5032 Chronic diastolic (congestive) heart failure: Secondary | ICD-10-CM

## 2022-06-02 DIAGNOSIS — K219 Gastro-esophageal reflux disease without esophagitis: Secondary | ICD-10-CM | POA: Diagnosis not present

## 2022-06-02 DIAGNOSIS — R1312 Dysphagia, oropharyngeal phase: Secondary | ICD-10-CM | POA: Diagnosis not present

## 2022-06-02 DIAGNOSIS — R5383 Other fatigue: Secondary | ICD-10-CM | POA: Diagnosis not present

## 2022-06-02 NOTE — Patient Instructions (Signed)
You have to get RSV vaccine and Prevnar 20 from Pharmacy now Get Shingrix and TDAP also but later

## 2022-06-02 NOTE — Progress Notes (Signed)
Location:  [place] [facility] Service:  Clinic (12)  Provider:   Code Status:  Goals of Care:     06/02/2022   10:03 AM  Advanced Directives  Does Patient Have a Medical Advance Directive? Yes  Type of Advance Directive Living will;Out of facility DNR (pink MOST or yellow form);Healthcare Power of Attorney  Does patient want to make changes to medical advance directive? No - Patient declined  Copy of [place] in Chart? No - copy requested     Chief Complaint  Patient presents with   Acute Visit    Patient is having some trouble eating and feeling like her energy is going down    Quality Metric Gaps    Discussed the need for Covid vaccine and shingles vaccine     HPI: Patient is a 88 y.o. female seen today for medical management of chronic diseases.    Came for her follow up Has history of bilateral breast cancer s/p lumpectomy and now restarted back on tamoxifen .  Patient feels tired and does have poor appetite but she states she is able to push through.  And want to continue tamoxifen for now PAF Recently was seen by cardiology for some palpitations has been told to take metoprolol extra dose if palpitations come back.  Patient states she has not had any issues recently Lower extremity edema with CHF with mildly elevated BNP Not having any shortness of breath continue Lasix  Also has history of hypertension hyperlipidemia, rhinitis, onychomycosis  Lives with her husband in [place].  Husband is  45 year old Walks with her walker no falls.  They do not have any children  Past Medical History:  Diagnosis Date   Allergic rhinitis, seasonal    Breast cancer of upper-outer quadrant of right female breast ([place]) 09/13/2014   Chronic venous insufficiency    Dermatophytosis of nail    Dysrhythmia    H/o Atrial fibrillation   Edema leg    Family history of malignant neoplasm of breast    Family history of malignant neoplasm of ovary    Goiter     right thyroid nodule    HTN (hypertension) 08/13/2017   HX: breast cancer    bilateral   Menopausal syndrome    Osteoarthritis    Osteoporosis    Squamous cell skin cancer    right lower leg   Wears glasses    Wears hearing aid    both ears    Past Surgical History:  Procedure Laterality Date   bilateral lumpectomies for breast cancer  Aug. 2007   BREAST LUMPECTOMY WITH RADIOACTIVE SEED LOCALIZATION Right 10/01/2014   Procedure: BREAST LUMPECTOMY WITH RADIOACTIVE SEED LOCALIZATION;  Surgeon: Erroll Luna, MD;  Location: [facility];  Service: General;  Laterality: Right;   BREAST LUMPECTOMY WITH RADIOACTIVE SEED LOCALIZATION Left 03/22/2019   Procedure: LEFT BREAST LUMPECTOMY WITH RADIOACTIVE SEED LOCALIZATION;  Surgeon: Erroll Luna, MD;  Location: [facility];  Service: General;  Laterality: Left;   BREAST LUMPECTOMY WITH RADIOACTIVE SEED LOCALIZATION Right 08/18/2021   Procedure: RIGHT BREAST LUMPECTOMY WITH RADIOACTIVE SEED LOCALIZATION;  Surgeon: Erroll Luna, MD;  Location: [facility];  Service: General;  Laterality: Right;   CATARACT EXTRACTION     CHOLECYSTECTOMY N/A 08/13/2017   Procedure: LAPAROSCOPIC CHOLECYSTECTOMY WITH INTRAOPERATIVE CHOLANGIOGRAM;  Surgeon: Excell Seltzer, MD;  Location: [facility];  Service: General;  Laterality: N/A;   DILATION AND CURETTAGE OF UTERUS     ESOPHAGOGASTRODUODENOSCOPY  07/26/2011   Procedure: ESOPHAGOGASTRODUODENOSCOPY (EGD);  Surgeon: Jeryl Columbia, MD;  Location: [facility];  Service: Endoscopy;  Laterality: N/A;   history of lumbar compression fracture     left eardum sx  1983   left knee arthroscopic surgery     no screening colonoscopy     s/p bilateral lumpectomies     SAVORY DILATION  07/26/2011   Procedure: SAVORY DILATION;  Surgeon: Jeryl Columbia, MD;  Location: [facility];  Service: Endoscopy;  Laterality: N/A;   status post resection squamous cell cancer right lower leg     TONSILLECTOMY     UMBILICAL HERNIA REPAIR       Allergies  Allergen Reactions   Celecoxib Swelling    Swelling-leg    Outpatient Encounter Medications as of 06/02/2022  Medication Sig   apixaban (ELIQUIS) 5 MG TABS tablet Take 1 tablet (5 mg total) by mouth 2 (two) times daily.   atorvastatin (LIPITOR) 40 MG tablet TAKE 1 TABLET BY MOUTH EVERY DAY   furosemide (LASIX) 40 MG tablet TAKE 1 TABLET BY MOUTH EVERY DAY   hydroxypropyl methylcellulose / hypromellose (ISOPTO TEARS / GONIOVISC) 2.5 % ophthalmic solution Place 1 drop into both eyes in the morning and at bedtime.   ipratropium (ATROVENT) 0.06 % nasal spray PLACE 2 SPRAYS INTO BOTH NOSTRILS 4 TIMES A DAY   ketoconazole (NIZORAL) 2 % cream Apply 1 application. topically 2 (two) times daily as needed for irritation.   KLOR-CON M20 20 MEQ tablet TAKE 1 TABLET BY MOUTH EVERY DAY   metoprolol succinate (TOPROL-XL) 25 MG 24 hr tablet TAKE 1 TABLET BY MOUTH EVERYDAY AT BEDTIME   montelukast (SINGULAIR) 10 MG tablet TAKE 1 TABLET BY MOUTH AT NIGHT FOR COUGH OR WHEEZING   Multiple Vitamins-Minerals (PRESERVISION AREDS 2) CAPS Take 1 capsule by mouth 2 (two) times daily.    pantoprazole (PROTONIX) 40 MG tablet TAKE 1 TABLET BY MOUTH EVERY DAY   [DISCONTINUED] metoprolol tartrate (LOPRESSOR) 25 MG tablet Take 1 tablet (25 mg total) by mouth as needed. For Palpitations more than 30 minutes   [DISCONTINUED] tamoxifen (NOLVADEX) 20 MG tablet Take 1 tablet (20 mg total) by mouth daily.   No facility-administered encounter medications on file as of 06/02/2022.    Review of Systems:  Review of Systems  Constitutional:  Positive for fatigue. Negative for activity change and appetite change.  HENT: Negative.    Respiratory:  Negative for cough and shortness of breath.   Cardiovascular:  Positive for leg swelling.  Gastrointestinal:  Negative for constipation.  Genitourinary: Negative.   Musculoskeletal:  Negative for arthralgias, gait problem and myalgias.  Skin: Negative.    Neurological:  Negative for dizziness and weakness.  Psychiatric/Behavioral:  Negative for confusion, dysphoric mood and sleep disturbance.     Health Maintenance  Topic Date Due   Zoster Vaccines- Shingrix (1 of 2) Never done   COVID-19 Vaccine (5 - Moderna risk series) 02/24/2022   Medicare Annual Wellness (AWV)  10/13/2022   TETANUS/TDAP  05/04/2023   Pneumonia Vaccine 35+ Years old  Completed   INFLUENZA VACCINE  Completed   DEXA SCAN  Completed   HPV VACCINES  Aged Out   MAMMOGRAM  Discontinued    Physical Exam: Vitals:   06/02/22 0957  BP: (!) 150/68  Pulse: 65  Resp: 17  Temp: 98.1 F (36.7 C)  TempSrc: Temporal  SpO2: 98%  Weight: 165 lb 9.6 oz (75.1 kg)  Height: '5\' 3"'$  (1.6 m)  Body mass index is 29.33 kg/m. Physical Exam Vitals reviewed.  Constitutional:      Appearance: Normal appearance.  HENT:     Head: Normocephalic.     Nose: Nose normal.     Mouth/Throat:     Mouth: Mucous membranes are moist.     Pharynx: Oropharynx is clear.  Eyes:     Pupils: Pupils are equal, round, and reactive to light.  Cardiovascular:     Rate and Rhythm: Normal rate and regular rhythm.     Pulses: Normal pulses.     Heart sounds: Normal heart sounds. No murmur heard. Pulmonary:     Effort: Pulmonary effort is normal.     Breath sounds: Normal breath sounds.  Abdominal:     General: Abdomen is flat. Bowel sounds are normal.     Palpations: Abdomen is soft.  Musculoskeletal:        General: Swelling present.     Cervical back: Neck supple.  Skin:    General: Skin is warm.  Neurological:     General: No focal deficit present.     Mental Status: She is alert and oriented to person, place, and time.  Psychiatric:        Mood and Affect: Mood normal.        Thought Content: Thought content normal.     Labs reviewed: Basic Metabolic Panel: Recent Labs    08/07/21 1409 09/15/21 1255 05/12/22 1532  NA 144 143 142  K 4.1 4.0 4.6  CL 110 108 104  CO2 '25 29  25  '$ GLUCOSE 112* 102* 128*  BUN 32* 40* 35  CREATININE 1.12* 1.16* 1.22*  CALCIUM 9.0 9.0 9.0  MG  --   --  1.8  TSH  --   --  3.160   Liver Function Tests: Recent Labs    09/15/21 1255  AST 14*  ALT 12  ALKPHOS 108  BILITOT 0.4  PROT 6.5  ALBUMIN 3.8   No results for input(s): "LIPASE", "AMYLASE" in the last 8760 hours. No results for input(s): "AMMONIA" in the last 8760 hours. CBC: Recent Labs    08/07/21 1409 09/15/21 1255 05/12/22 1532  WBC 6.0 6.4 6.6  NEUTROABS  --  4.8  --   HGB 12.2 11.5* 11.4  HCT 38.3 34.9* 34.6  MCV 94.3 93.3 90  PLT 182 172 182   Lipid Panel: No results for input(s): "CHOL", "HDL", "LDLCALC", "TRIG", "CHOLHDL", "LDLDIRECT" in the last 8760 hours. Lab Results  Component Value Date   HGBA1C 5.8 (H) 01/01/2021    Procedures since last visit: No results found.  Assessment/Plan 1. Chronic diastolic heart failure (HCC) Continue Lasix Doing well  2. Paroxysmal atrial fibrillation (HCC) Eliquis and Metoprolol No Palpitations recently  3. Hypercholesterolemia On statin Will need follow up of her Lipids  4. Essential hypertension Metoprolol  5. Gastroesophageal reflux disease, unspecified whether esophagitis present On Protonix  6. BREAST CANCER, HX OF On Tamoxifen   7. Other fatigue D/[place] about [place]  8. Perennial allergic rhinitis On Atrovent and Singulair  9. Onychomycosis Sees Podiatrist .   Labs/tests ordered:  * No order type specified * Next appt:  10/11/2022

## 2022-06-03 ENCOUNTER — Ambulatory Visit (INDEPENDENT_AMBULATORY_CARE_PROVIDER_SITE_OTHER): Payer: MEDICARE | Admitting: Podiatry

## 2022-06-03 ENCOUNTER — Encounter: Payer: Self-pay | Admitting: Podiatry

## 2022-06-03 DIAGNOSIS — M79676 Pain in unspecified toe(s): Secondary | ICD-10-CM

## 2022-06-03 DIAGNOSIS — D2371 Other benign neoplasm of skin of right lower limb, including hip: Secondary | ICD-10-CM | POA: Diagnosis not present

## 2022-06-03 DIAGNOSIS — D689 Coagulation defect, unspecified: Secondary | ICD-10-CM | POA: Diagnosis not present

## 2022-06-03 DIAGNOSIS — D2372 Other benign neoplasm of skin of left lower limb, including hip: Secondary | ICD-10-CM

## 2022-06-03 DIAGNOSIS — B351 Tinea unguium: Secondary | ICD-10-CM | POA: Diagnosis not present

## 2022-06-03 NOTE — Progress Notes (Signed)
She presents today chief complaint of painfully elongated toenails 1 through 5 bilateral.  She is also complaining of painful calluses plantar aspect of the bilateral foot.  Objective: Pulses remain palpable benign skin lesions plantar aspect of the bilateral foot particular the forefoot does not demonstrate any open lesions or wounds.  Toenails are long thick yellow dystrophic onychomycotic sharply incurvated painful palpation as well as debridement.  Assessment: Pain in limb secondary to onychomycosis and benign skin lesions.  Plan: Debrided and padded benign skin lesions today.  Debrided toenails 1 through 5 bilateral.  Follow-up with her in 3 months

## 2022-06-04 DIAGNOSIS — R1312 Dysphagia, oropharyngeal phase: Secondary | ICD-10-CM | POA: Diagnosis not present

## 2022-06-08 DIAGNOSIS — R1312 Dysphagia, oropharyngeal phase: Secondary | ICD-10-CM | POA: Diagnosis not present

## 2022-06-09 DIAGNOSIS — R1312 Dysphagia, oropharyngeal phase: Secondary | ICD-10-CM | POA: Diagnosis not present

## 2022-06-11 DIAGNOSIS — R1312 Dysphagia, oropharyngeal phase: Secondary | ICD-10-CM | POA: Diagnosis not present

## 2022-06-15 DIAGNOSIS — R1312 Dysphagia, oropharyngeal phase: Secondary | ICD-10-CM | POA: Diagnosis not present

## 2022-06-16 DIAGNOSIS — R1312 Dysphagia, oropharyngeal phase: Secondary | ICD-10-CM | POA: Diagnosis not present

## 2022-06-18 DIAGNOSIS — R1312 Dysphagia, oropharyngeal phase: Secondary | ICD-10-CM | POA: Diagnosis not present

## 2022-06-22 ENCOUNTER — Telehealth: Payer: Self-pay | Admitting: Hematology and Oncology

## 2022-06-22 NOTE — Telephone Encounter (Signed)
Called patient to r/s upcoming appointment. Patient notified and r/s

## 2022-06-23 DIAGNOSIS — R1312 Dysphagia, oropharyngeal phase: Secondary | ICD-10-CM | POA: Diagnosis not present

## 2022-06-25 DIAGNOSIS — R1312 Dysphagia, oropharyngeal phase: Secondary | ICD-10-CM | POA: Diagnosis not present

## 2022-06-29 DIAGNOSIS — R1312 Dysphagia, oropharyngeal phase: Secondary | ICD-10-CM | POA: Diagnosis not present

## 2022-06-30 DIAGNOSIS — R1312 Dysphagia, oropharyngeal phase: Secondary | ICD-10-CM | POA: Diagnosis not present

## 2022-07-02 DIAGNOSIS — R1312 Dysphagia, oropharyngeal phase: Secondary | ICD-10-CM | POA: Diagnosis not present

## 2022-07-05 DIAGNOSIS — R1312 Dysphagia, oropharyngeal phase: Secondary | ICD-10-CM | POA: Diagnosis not present

## 2022-07-07 ENCOUNTER — Non-Acute Institutional Stay: Payer: MEDICARE | Admitting: Internal Medicine

## 2022-07-07 ENCOUNTER — Encounter: Payer: Self-pay | Admitting: Internal Medicine

## 2022-07-07 VITALS — BP 132/66 | HR 98 | Temp 98.5°F | Resp 18 | Ht 63.0 in | Wt 166.1 lb

## 2022-07-07 DIAGNOSIS — I10 Essential (primary) hypertension: Secondary | ICD-10-CM | POA: Diagnosis not present

## 2022-07-07 DIAGNOSIS — J3089 Other allergic rhinitis: Secondary | ICD-10-CM

## 2022-07-07 DIAGNOSIS — Z7185 Encounter for immunization safety counseling: Secondary | ICD-10-CM | POA: Diagnosis not present

## 2022-07-07 DIAGNOSIS — R5383 Other fatigue: Secondary | ICD-10-CM | POA: Diagnosis not present

## 2022-07-07 DIAGNOSIS — E7800 Pure hypercholesterolemia, unspecified: Secondary | ICD-10-CM

## 2022-07-07 DIAGNOSIS — M542 Cervicalgia: Secondary | ICD-10-CM

## 2022-07-07 DIAGNOSIS — B351 Tinea unguium: Secondary | ICD-10-CM

## 2022-07-07 DIAGNOSIS — I480 Paroxysmal atrial fibrillation: Secondary | ICD-10-CM

## 2022-07-07 DIAGNOSIS — I5032 Chronic diastolic (congestive) heart failure: Secondary | ICD-10-CM

## 2022-07-07 DIAGNOSIS — R1312 Dysphagia, oropharyngeal phase: Secondary | ICD-10-CM | POA: Diagnosis not present

## 2022-07-07 DIAGNOSIS — K219 Gastro-esophageal reflux disease without esophagitis: Secondary | ICD-10-CM

## 2022-07-07 DIAGNOSIS — Z853 Personal history of malignant neoplasm of breast: Secondary | ICD-10-CM | POA: Diagnosis not present

## 2022-07-07 NOTE — Progress Notes (Signed)
Location: [facility] (12)  Provider:   Code Status: DNR Goals of Care:     07/07/2022   10:13 AM  Advanced Directives  Does Patient Have a Medical Advance Directive? Yes  Type of Advance Directive Living will;Out of facility DNR (pink MOST or yellow form);Healthcare Power of Attorney  Does patient want to make changes to medical advance directive? No - Patient declined  Copy of [place] in Chart? No - copy requested     Chief Complaint  Patient presents with   Acute Visit    Patient was having some shoulder blade    HPI: Patient is a 88 y.o. female seen today for an acute visit for Shoulder and Neck pain   Patient lives in [place] and friends home  Her main complaint today was some pain in her neck going down with her shoulders.  She applied Voltaren gel to have helped .  Is not going down her back in her arms .  She also wanted to know if it was okay to for them to take RSV vaccine Appetite has improved.  Has history of bilateral breast cancer s/p lumpectomy and now restarted back on tamoxifen   Recently was seen by cardiology for some palpitations has been told to take metoprolol extra dose if palpitations come back.  Patient states she has not had any issues recently  Lower extremity edema with CHF with mildly elevated BNP Not having any shortness of breath continue Lasix every day   Also has history of hypertension hyperlipidemia, rhinitis, onychomycosis   Lives with her husband in [place].  Husband is  48 year old Walks with her walker no falls.  They do not have any children Past Medical History:  Diagnosis Date   Allergic rhinitis, seasonal    Breast cancer of upper-outer quadrant of right female breast ([place]) 09/13/2014   Chronic venous insufficiency    Dermatophytosis of nail    Dysrhythmia    H/o Atrial fibrillation   Edema leg    Family history of malignant neoplasm of breast    Family history of  malignant neoplasm of ovary    Goiter    right thyroid nodule    HTN (hypertension) 08/13/2017   HX: breast cancer    bilateral   Menopausal syndrome    Osteoarthritis    Osteoporosis    Squamous cell skin cancer    right lower leg   Wears glasses    Wears hearing aid    both ears    Past Surgical History:  Procedure Laterality Date   bilateral lumpectomies for breast cancer  Aug. 2007   BREAST LUMPECTOMY WITH RADIOACTIVE SEED LOCALIZATION Right 10/01/2014   Procedure: BREAST LUMPECTOMY WITH RADIOACTIVE SEED LOCALIZATION;  Surgeon: Erroll Luna, MD;  Location: [facility];  Service: General;  Laterality: Right;   BREAST LUMPECTOMY WITH RADIOACTIVE SEED LOCALIZATION Left 03/22/2019   Procedure: LEFT BREAST LUMPECTOMY WITH RADIOACTIVE SEED LOCALIZATION;  Surgeon: Erroll Luna, MD;  Location: [facility];  Service: General;  Laterality: Left;   BREAST LUMPECTOMY WITH RADIOACTIVE SEED LOCALIZATION Right 08/18/2021   Procedure: RIGHT BREAST LUMPECTOMY WITH RADIOACTIVE SEED LOCALIZATION;  Surgeon: Erroll Luna, MD;  Location: [name];  Service: General;  Laterality: Right;   CATARACT EXTRACTION     CHOLECYSTECTOMY N/A 08/13/2017   Procedure: LAPAROSCOPIC CHOLECYSTECTOMY WITH INTRAOPERATIVE CHOLANGIOGRAM;  Surgeon: Excell Seltzer, MD;  Location: [facility];  Service: General;  Laterality: N/A;  DILATION AND CURETTAGE OF UTERUS     ESOPHAGOGASTRODUODENOSCOPY  07/26/2011   Procedure: ESOPHAGOGASTRODUODENOSCOPY (EGD);  Surgeon: Jeryl Columbia, MD;  Location: [facility];  Service: Endoscopy;  Laterality: N/A;   history of lumbar compression fracture     left eardum sx  1983   left knee arthroscopic surgery     no screening colonoscopy     s/p bilateral lumpectomies     SAVORY DILATION  07/26/2011   Procedure: SAVORY DILATION;  Surgeon: Jeryl Columbia, MD;  [facility];  Service: Endoscopy;  Laterality: N/A;   status post resection squamous cell cancer right lower leg      TONSILLECTOMY     UMBILICAL HERNIA REPAIR      Allergies  Allergen Reactions   Celecoxib Swelling    Swelling-leg    Outpatient Encounter Medications as of 07/07/2022  Medication Sig   apixaban (ELIQUIS) 5 MG TABS tablet Take 1 tablet (5 mg total) by mouth 2 (two) times daily.   atorvastatin (LIPITOR) 40 MG tablet TAKE 1 TABLET BY MOUTH EVERY DAY   furosemide (LASIX) 40 MG tablet TAKE 1 TABLET BY MOUTH EVERY DAY   hydroxypropyl methylcellulose / hypromellose (ISOPTO TEARS / GONIOVISC) 2.5 % ophthalmic solution Place 1 drop into both eyes in the morning and at bedtime.   ipratropium (ATROVENT) 0.06 % nasal spray PLACE 2 SPRAYS INTO BOTH NOSTRILS 4 TIMES A DAY   ketoconazole (NIZORAL) 2 % cream Apply 1 application. topically 2 (two) times daily as needed for irritation.   KLOR-CON M20 20 MEQ tablet TAKE 1 TABLET BY MOUTH EVERY DAY   metoprolol succinate (TOPROL-XL) 25 MG 24 hr tablet TAKE 1 TABLET BY MOUTH EVERYDAY AT BEDTIME   montelukast (SINGULAIR) 10 MG tablet TAKE 1 TABLET BY MOUTH AT NIGHT FOR COUGH OR WHEEZING   Multiple Vitamins-Minerals (PRESERVISION AREDS 2) CAPS Take 1 capsule by mouth 2 (two) times daily.    pantoprazole (PROTONIX) 40 MG tablet TAKE 1 TABLET BY MOUTH EVERY DAY   tamoxifen (NOLVADEX) 20 MG tablet Take 20 mg by mouth daily.   No facility-administered encounter medications on file as of 07/07/2022.    Review of Systems:  Review of Systems  Constitutional:  Negative for activity change and appetite change.  HENT: Negative.    Respiratory:  Negative for cough and shortness of breath.   Cardiovascular:  Positive for leg swelling.  Gastrointestinal:  Negative for constipation.  Genitourinary: Negative.   Musculoskeletal:  Positive for neck pain and neck stiffness. Negative for arthralgias, gait problem and myalgias.  Skin: Negative.   Neurological:  Negative for dizziness and weakness.  Psychiatric/Behavioral:  Negative for confusion, dysphoric mood and  sleep disturbance.     Health Maintenance  Topic Date Due   Zoster Vaccines- Shingrix (1 of 2) Never done   DTaP/Tdap/Td (2 - Tdap) 04/19/2015   COVID-19 Vaccine (5 - 2023-24 season) 03/19/2022   Medicare Annual Wellness (AWV)  10/13/2022   Pneumonia Vaccine 52+ Years old  Completed   INFLUENZA VACCINE  Completed   DEXA SCAN  Completed   HPV VACCINES  Aged Out   MAMMOGRAM  Discontinued    Physical Exam: Vitals:   07/07/22 1011  BP: 132/66  Pulse: 98  Resp: 18  Temp: 98.5 F (36.9 C)  TempSrc: Temporal  SpO2: 97%  Weight: 166 lb 1.6 oz (75.3 kg)  Height: '5\' 3"'$  (1.6 m)   Body mass index is 29.42 kg/m. Physical Exam Vitals reviewed.  Constitutional:  Appearance: Normal appearance.  HENT:     Head: Normocephalic.     Nose: Nose normal.     Mouth/Throat:     Mouth: Mucous membranes are moist.     Pharynx: Oropharynx is clear.  Eyes:     Pupils: Pupils are equal, round, and reactive to light.  Cardiovascular:     Rate and Rhythm: Normal rate and regular rhythm.     Pulses: Normal pulses.     Heart sounds: Normal heart sounds. No murmur heard. Pulmonary:     Effort: Pulmonary effort is normal.     Breath sounds: Normal breath sounds.  Abdominal:     General: Abdomen is flat. Bowel sounds are normal.     Palpations: Abdomen is soft.  Musculoskeletal:        General: Swelling present.     Cervical back: Neck supple.  Skin:    General: Skin is warm.  Neurological:     General: No focal deficit present.     Mental Status: She is alert and oriented to person, place, and time.  Psychiatric:        Mood and Affect: Mood normal.        Thought Content: Thought content normal.     Labs reviewed: Basic Metabolic Panel: Recent Labs    08/07/21 1409 09/15/21 1255 05/12/22 1532  NA 144 143 142  K 4.1 4.0 4.6  CL 110 108 104  CO2 '25 29 25  '$ GLUCOSE 112* 102* 128*  BUN 32* 40* 35  CREATININE 1.12* 1.16* 1.22*  CALCIUM 9.0 9.0 9.0  MG  --   --  1.8  TSH   --   --  3.160   Liver Function Tests: Recent Labs    09/15/21 1255  AST 14*  ALT 12  ALKPHOS 108  BILITOT 0.4  PROT 6.5  ALBUMIN 3.8   No results for input(s): "LIPASE", "AMYLASE" in the last 8760 hours. No results for input(s): "AMMONIA" in the last 8760 hours. CBC: Recent Labs    08/07/21 1409 09/15/21 1255 05/12/22 1532  WBC 6.0 6.4 6.6  NEUTROABS  --  4.8  --   HGB 12.2 11.5* 11.4  HCT 38.3 34.9* 34.6  MCV 94.3 93.3 90  PLT 182 172 182   Lipid Panel: No results for input(s): "CHOL", "HDL", "LDLCALC", "TRIG", "CHOLHDL", "LDLDIRECT" in the last 8760 hours. Lab Results  Component Value Date   HGBA1C 5.8 (H) 01/01/2021    Procedures since last visit: No results found.  Assessment/Plan 1. Neck pain Continue Voltaren Can also use [place]  2. Vaccine counseling RSV vaccine here in [place]  3. Chronic diastolic heart failure (HCC) Continue Lasix   4. Paroxysmal atrial fibrillation (HCC) Eliquis and Metoprolol No Palpitations recently  5. Hypercholesterolemia On statins  6. Essential hypertension  Metoprolol 7. Gastroesophageal reflux disease, unspecified whether esophagitis present Protonix  8. BREAST CANCER, HX OF On Tamoxifen  9. Other fatigue   10. Perennial allergic rhinitis On Atrovent and Singulair   11. Onychomycosis Podiatrist    Labs/tests ordered:  * No order type specified * Next appt:  10/11/2022

## 2022-07-07 NOTE — Patient Instructions (Signed)
Can apply Voltaren Gel on your Neck area 2-3 times a day for pain as needed You and [name] can get RSV vaccine

## 2022-07-09 DIAGNOSIS — R1312 Dysphagia, oropharyngeal phase: Secondary | ICD-10-CM | POA: Diagnosis not present

## 2022-07-13 DIAGNOSIS — R1312 Dysphagia, oropharyngeal phase: Secondary | ICD-10-CM | POA: Diagnosis not present

## 2022-07-14 DIAGNOSIS — R1312 Dysphagia, oropharyngeal phase: Secondary | ICD-10-CM | POA: Diagnosis not present

## 2022-07-15 ENCOUNTER — Ambulatory Visit: Payer: MEDICARE | Admitting: Hematology and Oncology

## 2022-07-15 DIAGNOSIS — H353122 Nonexudative age-related macular degeneration, left eye, intermediate dry stage: Secondary | ICD-10-CM | POA: Diagnosis not present

## 2022-07-15 DIAGNOSIS — H353211 Exudative age-related macular degeneration, right eye, with active choroidal neovascularization: Secondary | ICD-10-CM | POA: Diagnosis not present

## 2022-07-16 DIAGNOSIS — H43813 Vitreous degeneration, bilateral: Secondary | ICD-10-CM | POA: Diagnosis not present

## 2022-07-16 DIAGNOSIS — H353123 Nonexudative age-related macular degeneration, left eye, advanced atrophic without subfoveal involvement: Secondary | ICD-10-CM | POA: Diagnosis not present

## 2022-07-16 DIAGNOSIS — H35373 Puckering of macula, bilateral: Secondary | ICD-10-CM | POA: Diagnosis not present

## 2022-07-16 DIAGNOSIS — H353211 Exudative age-related macular degeneration, right eye, with active choroidal neovascularization: Secondary | ICD-10-CM | POA: Diagnosis not present

## 2022-07-16 DIAGNOSIS — R1312 Dysphagia, oropharyngeal phase: Secondary | ICD-10-CM | POA: Diagnosis not present

## 2022-07-18 DIAGNOSIS — R1312 Dysphagia, oropharyngeal phase: Secondary | ICD-10-CM | POA: Diagnosis not present

## 2022-07-21 DIAGNOSIS — R1312 Dysphagia, oropharyngeal phase: Secondary | ICD-10-CM | POA: Diagnosis not present

## 2022-07-22 ENCOUNTER — Other Ambulatory Visit: Payer: Self-pay

## 2022-07-22 ENCOUNTER — Inpatient Hospital Stay: Payer: MEDICARE | Attending: Hematology and Oncology | Admitting: Hematology and Oncology

## 2022-07-22 ENCOUNTER — Inpatient Hospital Stay: Payer: MEDICARE

## 2022-07-22 VITALS — BP 139/49 | HR 83 | Temp 98.1°F | Resp 14 | Ht 63.0 in | Wt 167.9 lb

## 2022-07-22 DIAGNOSIS — Z8249 Family history of ischemic heart disease and other diseases of the circulatory system: Secondary | ICD-10-CM | POA: Insufficient documentation

## 2022-07-22 DIAGNOSIS — Z9049 Acquired absence of other specified parts of digestive tract: Secondary | ICD-10-CM | POA: Diagnosis not present

## 2022-07-22 DIAGNOSIS — Z803 Family history of malignant neoplasm of breast: Secondary | ICD-10-CM | POA: Diagnosis not present

## 2022-07-22 DIAGNOSIS — C50411 Malignant neoplasm of upper-outer quadrant of right female breast: Secondary | ICD-10-CM | POA: Diagnosis not present

## 2022-07-22 DIAGNOSIS — I10 Essential (primary) hypertension: Secondary | ICD-10-CM | POA: Diagnosis not present

## 2022-07-22 DIAGNOSIS — Z7901 Long term (current) use of anticoagulants: Secondary | ICD-10-CM | POA: Insufficient documentation

## 2022-07-22 DIAGNOSIS — I4891 Unspecified atrial fibrillation: Secondary | ICD-10-CM | POA: Insufficient documentation

## 2022-07-22 DIAGNOSIS — Z8041 Family history of malignant neoplasm of ovary: Secondary | ICD-10-CM | POA: Insufficient documentation

## 2022-07-22 DIAGNOSIS — C50412 Malignant neoplasm of upper-outer quadrant of left female breast: Secondary | ICD-10-CM | POA: Diagnosis not present

## 2022-07-22 DIAGNOSIS — Z170 Estrogen receptor positive status [ER+]: Secondary | ICD-10-CM

## 2022-07-22 DIAGNOSIS — Z79810 Long term (current) use of selective estrogen receptor modulators (SERMs): Secondary | ICD-10-CM | POA: Insufficient documentation

## 2022-07-22 DIAGNOSIS — R5383 Other fatigue: Secondary | ICD-10-CM | POA: Insufficient documentation

## 2022-07-22 LAB — COMPREHENSIVE METABOLIC PANEL
ALT: 13 U/L (ref 0–44)
AST: 16 U/L (ref 15–41)
Albumin: 3.8 g/dL (ref 3.5–5.0)
Alkaline Phosphatase: 74 U/L (ref 38–126)
Anion gap: 8 (ref 5–15)
BUN: 32 mg/dL — ABNORMAL HIGH (ref 8–23)
CO2: 28 mmol/L (ref 22–32)
Calcium: 9.1 mg/dL (ref 8.9–10.3)
Chloride: 106 mmol/L (ref 98–111)
Creatinine, Ser: 1.19 mg/dL — ABNORMAL HIGH (ref 0.44–1.00)
GFR, Estimated: 41 mL/min — ABNORMAL LOW (ref >60)
Glucose, Bld: 108 mg/dL — ABNORMAL HIGH (ref 70–99)
Potassium: 4.6 mmol/L (ref 3.5–5.1)
Sodium: 142 mmol/L (ref 135–145)
Total Bilirubin: 0.4 mg/dL (ref 0.3–1.2)
Total Protein: 6.1 g/dL — ABNORMAL LOW (ref 6.5–8.1)

## 2022-07-22 NOTE — Progress Notes (Signed)
[name]  [phone] [phone]     ID: [name] DOB: October 07, 1934  MR#: [id number]  JYN#:[id number]  Patient Care Team: Virgie Dad, MD as PCP - General (Internal Medicine) Sanda Klein, MD as PCP - Cardiology (Cardiology) Erroll Luna, MD as Consulting Physician (General Surgery) Arloa Koh, MD (Inactive) as Consulting Physician (Radiation Oncology) [name], RN as Registered Nurse [name], RN as Registered Nurse [name], MD as Consulting Physician (Gastroenterology) Benay Pike, MD as Consulting Physician (Hematology and Oncology) [place], Tami Lin, [place] as Physician Assistant (Cardiology)  CHIEF COMPLAINT: Estrogen receptor positive breast cancer  CURRENT TREATMENT: observation  INTERVAL HISTORY:  [name] returns today for for follow-up of her more recent left breast cancer with her friend today.   She is taking tamoxifen as prescribed.  She is tolerating this very well.  She reports some fatigue but otherwise no new hot flashes, vaginal wetness or vaginal bleeding. She has not noticed any changes in her breast.  She is hard of hearing.    Rest of the pertinent 10 point review of systems reviewed and negative.  REVIEW OF SYSTEMS:  A detailed review of systems today was benign.   COVID 19 VACCINATION STATUS: Moderna x4, last 12/2020   BREAST CANCER HISTORY: From the earlier summary note:  I saw [name] previously for a history of bilateral breast cancers, treated with bilateral lumpectomies and anti-estrogens for 5 years. She did not receive radiation treatments. She was last seen here in 2011.  She had her annual screening mammography at [facility] 08/27/2013, and this showed the breast density to be category B. There was an area of new grouped heterogeneous calcifications in the right breast at the 11:00 middle depth. A focal asymmetric density anterior to that area was seen in one view only. Additional  views were recommended, but if they were performed I do not have those records. There appears to have been mammography on 03/05/2014, but again I do not have those records.  On 08/30/2014, bilateral diagnostic mammography with tomosynthesis this was obtained. The calcifications at the 11:00 position in the right breast were increased in number. There were no other significant findings. Biopsy of the area in question to 20 11/06/2014 showed ([id number]) high-grade ductal carcinoma in situ, with mucin extravasation.no definitive invasive component was seen however. This high-grade noninvasive tumor was estrogen receptor positive at 100%, progesterone receptor positive at 44%, both with strong staining intensity.  Her subsequent history is as detailed below   PAST MEDICAL HISTORY: Past Medical History:  Diagnosis Date   Allergic rhinitis, seasonal    Breast cancer of upper-outer quadrant of right female breast ([place]) 09/13/2014   Chronic venous insufficiency    Dermatophytosis of nail    Dysrhythmia    H/o Atrial fibrillation   Edema leg    Family history of malignant neoplasm of breast    Family history of malignant neoplasm of ovary    Goiter    right thyroid nodule    HTN (hypertension) 08/13/2017   HX: breast cancer    bilateral   Menopausal syndrome    Osteoarthritis    Osteoporosis    Squamous cell skin cancer    right lower leg   Wears glasses    Wears hearing aid    both ears    PAST SURGICAL HISTORY: Past Surgical History:  Procedure Laterality Date   bilateral lumpectomies for breast cancer  Aug. 2007   BREAST LUMPECTOMY WITH RADIOACTIVE  SEED LOCALIZATION Right 10/01/2014   Procedure: BREAST LUMPECTOMY WITH RADIOACTIVE SEED LOCALIZATION;  Surgeon: Erroll Luna, MD;  Location: [place];  Service: General;  Laterality: Right;   BREAST LUMPECTOMY WITH RADIOACTIVE SEED LOCALIZATION Left 03/22/2019   Procedure: LEFT BREAST LUMPECTOMY WITH RADIOACTIVE SEED  LOCALIZATION;  Surgeon: Erroll Luna, MD;  Location: [place];  Service: General;  Laterality: Left;   BREAST LUMPECTOMY WITH RADIOACTIVE SEED LOCALIZATION Right 08/18/2021   Procedure: RIGHT BREAST LUMPECTOMY WITH RADIOACTIVE SEED LOCALIZATION;  Surgeon: Erroll Luna, MD;  Location: [place];  Service: General;  Laterality: Right;   CATARACT EXTRACTION     CHOLECYSTECTOMY N/A 08/13/2017   Procedure: LAPAROSCOPIC CHOLECYSTECTOMY WITH INTRAOPERATIVE CHOLANGIOGRAM;  Surgeon: Excell Seltzer, MD;  Location: [facility];  Service: General;  Laterality: N/A;   DILATION AND CURETTAGE OF UTERUS     ESOPHAGOGASTRODUODENOSCOPY  07/26/2011   Procedure: ESOPHAGOGASTRODUODENOSCOPY (EGD);  Surgeon: Jeryl Columbia, MD;  Location: [facility];  Service: Endoscopy;  Laterality: N/A;   history of lumbar compression fracture     left eardum sx  1983   left knee arthroscopic surgery     no screening colonoscopy     s/p bilateral lumpectomies     SAVORY DILATION  07/26/2011   Procedure: SAVORY DILATION;  Surgeon: Jeryl Columbia, MD;  Location: [facility];  Service: Endoscopy;  Laterality: N/A;   status post resection squamous cell cancer right lower leg     TONSILLECTOMY     UMBILICAL HERNIA REPAIR      FAMILY HISTORY Family History  Problem Relation Age of Onset   Cancer Mother 70       breast   Heart attack Father    Heart attack Brother    Heart attack Brother    Cancer Maternal Aunt 59       breast   Coronary artery disease Other    Cancer Other        ovarian; daughter of mat aunt w/ BC in 69s   Heart disease Brother    Cancer Maternal Aunt 60       breast  the patient's father died at age 87 from a heart attack. The patient's mother died at age 87. The patient's mother was one of 56 sisters, 9 siblings. 2 of the sisters had breast cancer, postmenopausal. Jaimee Corum self had 2 half-brothers and 1 full brother as well as one half-sister.There is no history of ovarian cancer in the familyexcept as  noted.   GYNECOLOGIC HISTORY:  No LMP recorded. Patient is postmenopausal. Menarche age 3, the patient is GX P0. 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 Her husband [name] is a former Programme researcher, broadcasting/film/video. They currently reside at friends home [place], with no pets.    ADVANCED DIRECTIVES: in place   HEALTH MAINTENANCE: Social History   Tobacco Use   Smoking status: Never   Smokeless tobacco: Never  Vaping Use   Vaping Use: Never used  Substance Use Topics   Alcohol use: No   Drug use: No     Colonoscopy:  PAP:  Bone density:  Lipid panel:  Allergies  Allergen Reactions   Celecoxib Swelling    Swelling-leg    Current Outpatient Medications  Medication Sig Dispense Refill   apixaban (ELIQUIS) 5 MG TABS tablet Take 1 tablet (5 mg total) by mouth 2 (two) times  daily. 180 tablet 1   atorvastatin (LIPITOR) 40 MG tablet TAKE 1 TABLET BY MOUTH EVERY DAY 90 tablet 1   furosemide (LASIX) 40 MG tablet TAKE 1 TABLET BY MOUTH EVERY DAY 90 tablet 3   hydroxypropyl methylcellulose / hypromellose (ISOPTO TEARS / GONIOVISC) 2.5 % ophthalmic solution Place 1 drop into both eyes in the morning and at bedtime.     ipratropium (ATROVENT) 0.06 % nasal spray PLACE 2 SPRAYS INTO BOTH NOSTRILS 4 TIMES A DAY 15 mL 10   ketoconazole (NIZORAL) 2 % cream Apply 1 application. topically 2 (two) times daily as needed for irritation.     KLOR-CON M20 20 MEQ tablet TAKE 1 TABLET BY MOUTH EVERY DAY 90 tablet 3   metoprolol succinate (TOPROL-XL) 25 MG 24 hr tablet TAKE 1 TABLET BY MOUTH EVERYDAY AT BEDTIME 90 tablet 1   montelukast (SINGULAIR) 10 MG tablet TAKE 1 TABLET BY MOUTH AT NIGHT FOR COUGH OR WHEEZING 90 tablet 1   Multiple Vitamins-Minerals (PRESERVISION AREDS 2) CAPS Take 1 capsule by mouth 2 (two) times daily.      pantoprazole (PROTONIX) 40 MG  tablet TAKE 1 TABLET BY MOUTH EVERY DAY 90 tablet 1   tamoxifen (NOLVADEX) 20 MG tablet Take 20 mg by mouth daily.     No current facility-administered medications for this visit.    OBJECTIVE: white woman using a walker  Vitals:   07/22/22 1510  BP: (!) 139/49  Pulse: 83  Resp: 14  Temp: 98.1 F (36.7 C)  SpO2: 100%      Body mass index is 29.74 kg/m.    ECOG FS:2 - Symptomatic, <50% confined to bed  Sclerae unicteric, EOMs intact Breasts: Bilateral breasts inspected and palpated.  No new masses noted.  No regional adenopathy Lower extremities: She is wearing compression socks today, no new edema. She has some baseline edema which is stable.  LAB RESULTS:  CMP     Component Value Date/Time   NA 142 05/12/2022 1532   NA 145 09/19/2017 0000   NA 145 09/18/2014 1245   K 4.6 05/12/2022 1532   K 3.7 09/19/2017 0000   K 3.9 09/18/2014 1245   CL 104 05/12/2022 1532   CO2 25 05/12/2022 1532   CO2 25 09/18/2014 1245   GLUCOSE 128 (H) 05/12/2022 1532   GLUCOSE 102 (H) 09/15/2021 1255   GLUCOSE 107 09/18/2014 1245   BUN 35 05/12/2022 1532   BUN 20.1 09/18/2014 1245   CREATININE 1.22 (H) 05/12/2022 1532   CREATININE 1.16 (H) 09/15/2021 1255   CREATININE 1.19 (H) 01/21/2021 0907   CREATININE 1.2 (H) 09/18/2014 1245   CALCIUM 9.0 05/12/2022 1532   CALCIUM 7.3 09/19/2017 0000   CALCIUM 9.1 09/18/2014 1245   PROT 6.5 09/15/2021 1255   PROT 5.1 09/19/2017 0000   PROT 6.8 09/18/2014 1245   ALBUMIN 3.8 09/15/2021 1255   ALBUMIN 2.8 09/19/2017 0000   ALBUMIN 3.8 09/18/2014 1245   AST 14 (L) 09/15/2021 1255   AST 14 09/18/2014 1245   ALT 12 09/15/2021 1255   ALT 23 09/19/2017 0000   ALT 8 09/18/2014 1245   ALKPHOS 108 09/15/2021 1255   ALKPHOS 87 09/19/2017 0000   ALKPHOS 100 09/18/2014 1245   BILITOT 0.4 09/15/2021 1255   BILITOT 0.49 09/18/2014 1245   GFRNONAA 43 (L) 09/15/2021 1255   GFRNONAA 38 (L) 01/21/2021 0907   GFRAA 44 (L) 01/21/2021 0907    INo results  found for: "SPEP", "UPEP"  Lab Results  Component Value Date   WBC 6.6 05/12/2022   NEUTROABS 4.8 09/15/2021   HGB 11.4 05/12/2022   HCT 34.6 05/12/2022   MCV 90 05/12/2022   PLT 182 05/12/2022      Chemistry      Component Value Date/Time   NA 142 05/12/2022 1532   NA 145 09/19/2017 0000   NA 145 09/18/2014 1245   K 4.6 05/12/2022 1532   K 3.7 09/19/2017 0000   K 3.9 09/18/2014 1245   CL 104 05/12/2022 1532   CO2 25 05/12/2022 1532   CO2 25 09/18/2014 1245   BUN 35 05/12/2022 1532   BUN 20.1 09/18/2014 1245   CREATININE 1.22 (H) 05/12/2022 1532   CREATININE 1.16 (H) 09/15/2021 1255   CREATININE 1.19 (H) 01/21/2021 0907   CREATININE 1.2 (H) 09/18/2014 1245   GLU 123 07/09/2019 0000      Component Value Date/Time   CALCIUM 9.0 05/12/2022 1532   CALCIUM 7.3 09/19/2017 0000   CALCIUM 9.1 09/18/2014 1245   ALKPHOS 108 09/15/2021 1255   ALKPHOS 87 09/19/2017 0000   ALKPHOS 100 09/18/2014 1245   AST 14 (L) 09/15/2021 1255   AST 14 09/18/2014 1245   ALT 12 09/15/2021 1255   ALT 23 09/19/2017 0000   ALT 8 09/18/2014 1245   BILITOT 0.4 09/15/2021 1255   BILITOT 0.49 09/18/2014 1245       Lab Results  Component Value Date   LABCA2 34 05/20/2011    No components found for: "LABCA125"  No results for input(s): "INR" in the last 168 hours.  Urinalysis    Component Value Date/Time   COLORURINE YELLOW 08/12/2017 0627   APPEARANCEUR Clear 05/12/2022 1532   LABSPEC 1.014 08/12/2017 0627   PHURINE 6.0 08/12/2017 0627   GLUCOSEU Negative 05/12/2022 1532   HGBUR SMALL (A) 08/12/2017 0627   HGBUR trace-intact 10/28/2009 0905   BILIRUBINUR Negative 05/12/2022 [place] 08/12/2017 0627   PROTEINUR Negative 05/12/2022 1532   PROTEINUR NEGATIVE 08/12/2017 0627   UROBILINOGEN 0.2 10/28/2009 0905   NITRITE Negative 05/12/2022 1532   NITRITE NEGATIVE 08/12/2017 0627   LEUKOCYTESUR 1+ (A) 05/12/2022 1532    STUDIES: No results found.     ASSESSMENT: 89 y.o. BRCA negative [place] resident   (1) status post bilateral lumpectomies without sentinel lymph node biopsy in August 2007 for a right-sided 7 mm, grade 2, invasive ductal carcinoma, and a left-sided 8 mm, grade 1, invasive ductal carcinoma.  Both tumors were ER positive, HER-2/neu negative, both with a low proliferation fraction.   (a) On Arimidex from September 2007 until May 2009, at which time we switched to tamoxifen primarily secondary to concerns regarding osteoporosis, completing five years of antiestrogen September 2011.   (b)  Also received Zometa yearly, discontinued after 4 doses in 2011  (2)  Status post right breast upper outer quadrant biopsy 09/11/2014 4 a 4.2 cm area of ductal carcinoma in situ, grade 2 or 3,  with extravasated mucin, estrogen and progesterone receptor positive   (3) status post right lumpectomy 10/01/2014 for a pT1a pNX, stage IA invasive ductal carcinoma, grade 2, estrogen  and progesterone receptor positive, HER-2 negative, with an MIB-1 of 38%. Margins were close but negative   (4) she did not need adjuvant radiation   (5) opted against anti-estrogens  (a)  DEXA scan 10/31/2014 shows a T score of -1.8.  (6)  the BreastNext gene panel performed at [facility] 09/19/2014 showed no deleterious mutations in  ATM, BARD1, BRCA1, BRCA2, BRIP1, CDH1, CHEK2, MRE11A, MUTYH, NBN, NF1, PALB2, PTEN, RAD50, RAD51C, RAD51D, or TP53.  (7) status post left lumpectomy 03/28/2019 for a pT1c pNX, stage IA invasive ductal carcinoma, with negative margins; the tumor was strongly estrogen and progesterone receptor positive, HER-2 not amplified, with an MIB-1 of 15%.  (8) she is status post  right lumpectomy on 08/18/2021, final pathology showed grade 2 IDC measuring 4 mm, DCIS, low to intermediate grade, resection margins are negative for invasive carcinoma, closest anterior margin at 2 mm prognostic showed ER 95% strong intensity, PR 95% strong  intensity, Ki-67 of less than 1% and HER2 negative.   PLAN  We have discussed the pathology from her recent surgery which again showed small strongly ER/PR positive invasive ductal carcinoma.   Given multiple breast cancers which are strongly ER/PR positive, she was restarted on adjuvant tamoxifen.  She is tolerating tamoxifen very well.   No concerns on physical examination today. She is due for mammogram, have given her the number to call [facility] and schedule this. If no concerns on mammogram results, she will return to clinic in 6 months or sooner as needed. Total encounter time 79mnutes.*   *Total Encounter Time as defined by the Centers for Medicare and Medicaid Services includes, in addition to the face-to-face time of a patient visit (documented in the note above) non-face-to-face time: obtaining and reviewing outside history, ordering and reviewing medications, tests or procedures, care coordination (communications with other health care professionals or caregivers) and documentation in the medical record.  PBenay PikeMD

## 2022-07-23 ENCOUNTER — Encounter: Payer: Self-pay | Admitting: Hematology and Oncology

## 2022-07-23 DIAGNOSIS — R1312 Dysphagia, oropharyngeal phase: Secondary | ICD-10-CM | POA: Diagnosis not present

## 2022-07-27 DIAGNOSIS — H353211 Exudative age-related macular degeneration, right eye, with active choroidal neovascularization: Secondary | ICD-10-CM | POA: Diagnosis not present

## 2022-07-28 ENCOUNTER — Other Ambulatory Visit: Payer: Self-pay

## 2022-07-28 ENCOUNTER — Telehealth: Payer: Self-pay

## 2022-07-28 ENCOUNTER — Encounter: Payer: Self-pay | Admitting: Hematology and Oncology

## 2022-07-28 DIAGNOSIS — Z170 Estrogen receptor positive status [ER+]: Secondary | ICD-10-CM

## 2022-07-28 DIAGNOSIS — R1312 Dysphagia, oropharyngeal phase: Secondary | ICD-10-CM | POA: Diagnosis not present

## 2022-07-28 NOTE — Telephone Encounter (Signed)
Faxed orders to [facility] per MD. Fax confirmation received.

## 2022-07-30 DIAGNOSIS — R1312 Dysphagia, oropharyngeal phase: Secondary | ICD-10-CM | POA: Diagnosis not present

## 2022-08-02 ENCOUNTER — Telehealth: Payer: Self-pay

## 2022-08-02 NOTE — Telephone Encounter (Signed)
Below is Amy's reply:  Yvonna Alanis, NP  You9 minutes ago (1:04 PM)    Spoke with patient via telephone. She does not have any symptoms at this time. Refused paxlovid. Will call office if she feels worse. Advised to take vitamin C 1000 mg daily, vitamin D 2000 units daily and Zinc 50 mg daily x 7-10 days.

## 2022-08-02 NOTE — Telephone Encounter (Signed)
Patients daughter stopped by the office and filled out a walk in form stating her mother and father have both tested positive for covid and need treatment. Patients phone is messed up and they are unable to receive or make calls.

## 2022-08-02 NOTE — Progress Notes (Signed)
Per Dr. Chryl Heck, Pts [place] and MM have not been scheduled. Faxed orders to [facility] with receipt confirmation. Called and LVM with Janett Billow of [place] at [phone], gave call back number.

## 2022-08-05 ENCOUNTER — Other Ambulatory Visit: Payer: Self-pay | Admitting: Hematology and Oncology

## 2022-08-09 DIAGNOSIS — Z853 Personal history of malignant neoplasm of breast: Secondary | ICD-10-CM | POA: Diagnosis not present

## 2022-08-13 ENCOUNTER — Ambulatory Visit: Payer: MEDICARE | Attending: Cardiovascular Disease | Admitting: Cardiovascular Disease

## 2022-08-13 VITALS — BP 130/50 | HR 83 | Ht 60.0 in | Wt 165.4 lb

## 2022-08-13 DIAGNOSIS — I25119 Atherosclerotic heart disease of native coronary artery with unspecified angina pectoris: Secondary | ICD-10-CM | POA: Diagnosis not present

## 2022-08-13 DIAGNOSIS — D6869 Other thrombophilia: Secondary | ICD-10-CM

## 2022-08-13 DIAGNOSIS — I480 Paroxysmal atrial fibrillation: Secondary | ICD-10-CM | POA: Insufficient documentation

## 2022-08-13 DIAGNOSIS — I10 Essential (primary) hypertension: Secondary | ICD-10-CM | POA: Diagnosis not present

## 2022-08-13 DIAGNOSIS — E7800 Pure hypercholesterolemia, unspecified: Secondary | ICD-10-CM | POA: Insufficient documentation

## 2022-08-13 NOTE — Patient Instructions (Addendum)
Medication Instructions:  No changes *If you need a refill on your cardiac medications before your next appointment, please call your pharmacy*   Follow-Up: At [facility] you and your health needs are our priority.  As part of our continuing mission to provide you with exceptional heart care, we have created designated Provider Care Teams.  These Care Teams include your primary Cardiologist (physician) and Advanced Practice Providers (APPs -  Physician Assistants and Nurse Practitioners) who all work together to provide you with the care you need, when you need it.  We recommend signing up for the patient portal called "MyChart".  Sign up information is provided on this After Visit Summary.  MyChart is used to connect with patients for Virtual Visits (Telemedicine).  Patients are able to view lab/test results, encounter notes, upcoming appointments, etc.  Non-urgent messages can be sent to your provider as well.   To learn more about what you can do with MyChart, go to [email or web address].    Your next appointment:   12/10/22 at 10:20  Provider:   Sanda Klein, MD

## 2022-08-13 NOTE — Progress Notes (Unsigned)
Cardiology office note   Date:  08/14/2022   ID:  [name], DOB Aug 11, 1921, MRN [id number]  PCP:  Taylor Dad, MD  Cardiologist:  Taylor Klein, MD  Electrophysiologist:  None   Evaluation Performed:  Follow-Up Visit  Chief Complaint: Atrial fibrillation  History of Present Illness:    [name] is a 89 y.o. female with history of paroxysmal atrial fibrillation, complicated by non-STEMI and transient congestive heart failure in the setting of cholecystectomy in January 2019.  She had COVID-19 pneumonia with hypoxemia and required brief hospitalization in late 2020, recovered well from this.  She is feeling well.  Her husband is now 23.  They have celebrated their 77th wedding anniversary.  She denies any problems with chest pain or shortness of breath.  Her lower extremity edema is generally well-controlled with compression stockings.  She has not had any issues with weeping from the skin, bullous lesions or uncomfortable degree of edema.  Usually the swelling is much better when she gets up in the morning.  She denies orthopnea or PND.  She has not had any falls, injuries or serious bleeding.  She denies any focal neurological complaints.  She has not had recent palpitations, dizziness or syncope.  She did have some problems with increased palpitations last October that resolved spontaneously.  She is compliant with apixaban anticoagulation.  She is in normal sinus rhythm today.   Past Medical History:  Diagnosis Date   Allergic rhinitis, seasonal    Breast cancer of upper-outer quadrant of right female breast ([place]) 09/13/2014   Chronic venous insufficiency    Dermatophytosis of nail    Dysrhythmia    H/o Atrial fibrillation   Edema leg    Family history of malignant neoplasm of breast    Family history of malignant neoplasm of ovary    Goiter    right thyroid nodule    HTN (hypertension) 08/13/2017   HX: breast cancer    bilateral   Menopausal syndrome     Osteoarthritis    Osteoporosis    Squamous cell skin cancer    right lower leg   Wears glasses    Wears hearing aid    both ears   Past Surgical History:  Procedure Laterality Date   bilateral lumpectomies for breast cancer  Aug. 2007   BREAST LUMPECTOMY WITH RADIOACTIVE SEED LOCALIZATION Right 10/01/2014   Procedure: BREAST LUMPECTOMY WITH RADIOACTIVE SEED LOCALIZATION;  Surgeon: Erroll Luna, MD;  Location: [facility];  Service: General;  Laterality: Right;   BREAST LUMPECTOMY WITH RADIOACTIVE SEED LOCALIZATION Left 03/22/2019   Procedure: LEFT BREAST LUMPECTOMY WITH RADIOACTIVE SEED LOCALIZATION;  Surgeon: Erroll Luna, MD;  Location: [facility];  Service: General;  Laterality: Left;   BREAST LUMPECTOMY WITH RADIOACTIVE SEED LOCALIZATION Right 08/18/2021   Procedure: RIGHT BREAST LUMPECTOMY WITH RADIOACTIVE SEED LOCALIZATION;  Surgeon: Erroll Luna, MD;  Location: [facility];  Service: General;  Laterality: Right;   CATARACT EXTRACTION     CHOLECYSTECTOMY N/A 08/13/2017   Procedure: LAPAROSCOPIC CHOLECYSTECTOMY WITH INTRAOPERATIVE CHOLANGIOGRAM;  Surgeon: Excell Seltzer, MD;  Location: [facility];  Service: General;  Laterality: N/A;   DILATION AND CURETTAGE OF UTERUS     ESOPHAGOGASTRODUODENOSCOPY  07/26/2011   Procedure: ESOPHAGOGASTRODUODENOSCOPY (EGD);  Surgeon: Jeryl Columbia, MD;  Location: [facility];  Service: Endoscopy;  Laterality: N/A;   history of lumbar compression fracture     left eardum sx  1983   left knee arthroscopic surgery  no screening colonoscopy     s/p bilateral lumpectomies     SAVORY DILATION  07/26/2011   Procedure: SAVORY DILATION;  Surgeon: Jeryl Columbia, MD;  Location: [facility];  Service: Endoscopy;  Laterality: N/A;   status post resection squamous cell cancer right lower leg     TONSILLECTOMY     UMBILICAL HERNIA REPAIR       No outpatient medications have been marked as taking for the 08/13/22 encounter (Office Visit) with Azizi Bally,  Dani Deboer, MD.     Allergies:   Celecoxib   Social History   Tobacco Use   Smoking status: Never   Smokeless tobacco: Never  Vaping Use   Vaping Use: Never used  Substance Use Topics   Alcohol use: No   Drug use: No     Family Hx: The patient's family history includes Cancer in an other family member; Cancer (age of onset: 78) in her maternal aunt; Cancer (age of onset: 60) in her maternal aunt; Cancer (age of onset: 4) in her mother; Coronary artery disease in an other family member; Heart attack in her brother, brother, and father; Heart disease in her brother.  ROS:   Please see the history of present illness.   All other systems are reviewed and are negative.   Prior CV studies:   The following studies were reviewed today:  Echo from 08/26/2017 shows LVEF 50-55%, pseudonormal mitral inflow, moderate mitral insufficiency and moderate tricuspid insufficiency, mild to moderately dilated left atrium  Duplex venous ultrasound 05/11/2019 was negative for thrombus  Labs/Other Tests and Data Reviewed:    EKG: Ordered today and personally reviewed.  Shows normal sinus rhythm, left axis deviation, left ventricular hypertrophy with mild prolongation of the QRS complex, QTc 455 ms.  Unchanged from previous tracings.  Recent Labs: 05/12/2022: BNP 233.4; Hemoglobin 11.4; Magnesium 1.8; Platelets 182; TSH 3.160 07/22/2022: ALT 13; BUN 32; Creatinine, Ser 1.19; Potassium 4.6; Sodium 142   Recent Lipid Panel Lab Results  Component Value Date/Time   CHOL 145 01/01/2021 08:00 AM   CHOL 109 09/19/2017 12:00 AM   TRIG 114 01/01/2021 08:00 AM   TRIG 107 09/19/2017 12:00 AM   HDL 50 01/01/2021 08:00 AM   CHOLHDL 2.9 01/01/2021 08:00 AM   LDLCALC 75 01/01/2021 08:00 AM   LDLCALC 59 09/19/2017 12:00 AM   LDLDIRECT 173.9 06/25/2013 09:48 AM    Wt Readings from Last 3 Encounters:  08/13/22 165 lb 6.4 oz (75 kg)  07/22/22 167 lb 14.4 oz (76.2 kg)  07/07/22 166 lb 1.6 oz (75.3 kg)      Objective:    Vital Signs:  BP (!) 130/50   Pulse 83   Ht 5' (1.524 m)   Wt 165 lb 6.4 oz (75 kg)   SpO2 94%   BMI 32.30 kg/m     General: Alert, oriented x3, no distress, appears well.  Mildly obese.  Looks younger than stated age. Head: no evidence of trauma, PERRL, EOMI, no exophtalmos or lid lag, no myxedema, no xanthelasma; normal ears, nose and oropharynx Neck: normal jugular venous pulsations and no hepatojugular reflux; brisk carotid pulses without delay and no carotid bruits Chest: clear to auscultation, no signs of consolidation by percussion or palpation, normal fremitus, symmetrical and full respiratory excursions Cardiovascular: normal position and quality of the apical impulse, regular rhythm, normal first and second heart sounds, no murmurs, rubs or gallops Abdomen: no tenderness or distention, no masses by palpation, no abnormal pulsatility or arterial bruits, normal  bowel sounds, no hepatosplenomegaly Extremities: no clubbing, cyanosis or edema; 2+ radial, ulnar and brachial pulses bilaterally; 2+ right femoral, posterior tibial and dorsalis pedis pulses; 2+ left femoral, posterior tibial and dorsalis pedis pulses; no subclavian or femoral bruits Neurological: grossly nonfocal except very hard of hearing Psych: Normal mood and affect    Assessment and plan  1. Paroxysmal atrial fibrillation (HCC)   2. Acquired thrombophilia ([place])   3. Hypercholesterolemia   4. Coronary artery disease involving native coronary artery of native heart with angina pectoris ([place])   5. Essential hypertension       AFib: She had some problems with sustained palpitations last fall, but these resolved spontaneously.  They have not bothered her in the last few months.  She is on chronic anticoagulation and takes a beta-blocker.   CHA2DS2-VASc score 4-6 (age 38, gender, +/-hx of resolved CHF, +/-suspected CAD, hypertension). Anticoagulation: Well-tolerated without bleeding issues. HLP: On  statin, with acceptable LDL cholesterol level CAD: Suspected but not proven with angiography.  Plan conservative management.  She does not have any angina pectoris.  Non-STEMI and transient congestive heart failure in the setting of acute cholecystitis/cholecystectomy, without subsequent problems with angina pectoris.  Suspect underlying CAD, but conservative management preferred in view of age and preserved left ventricular systolic function.  Medications include beta-blocker.  Not on aspirin due to full anticoagulation with apixaban. HTN: She bears a diagnosis of hypertension, but her blood pressure is normal with low doses of metoprolol and once daily furosemide as the only antihypertensive medications.  Note that her diastolic blood pressure is quite low, consistent with noncompliant arterial system.  Will schedule her follow-up appointment in about 4 months so that she can be back on the same schedule with her husband.   Patient Instructions  Medication Instructions:  No changes *If you need a refill on your cardiac medications before your next appointment, please call your pharmacy*   Follow-Up: At [facility], you and your health needs are our priority.  As part of our continuing mission to provide you with exceptional heart care, we have created designated Provider Care Teams.  These Care Teams include your primary Cardiologist (physician) and Advanced Practice Providers (APPs -  Physician Assistants and Nurse Practitioners) who all work together to provide you with the care you need, when you need it.  We recommend signing up for the patient portal called "MyChart".  Sign up information is provided on this After Visit Summary.  MyChart is used to connect with patients for Virtual Visits (Telemedicine).  Patients are able to view lab/test results, encounter notes, upcoming appointments, etc.  Non-urgent messages can be sent to your provider as well.   To learn more about what you  can do with MyChart, go to [email or web address].    Your next appointment:   12/10/22 at 10:20  Provider:   Sanda Klein, MD       Signed, Taylor Klein, MD  08/14/2022 4:10 PM    [facility]

## 2022-08-14 ENCOUNTER — Encounter: Payer: Self-pay | Admitting: Cardiovascular Disease

## 2022-08-16 ENCOUNTER — Other Ambulatory Visit: Payer: Self-pay | Admitting: Allergy and Immunology

## 2022-08-24 DIAGNOSIS — H35373 Puckering of macula, bilateral: Secondary | ICD-10-CM | POA: Diagnosis not present

## 2022-08-24 DIAGNOSIS — H353211 Exudative age-related macular degeneration, right eye, with active choroidal neovascularization: Secondary | ICD-10-CM | POA: Diagnosis not present

## 2022-08-24 DIAGNOSIS — H43813 Vitreous degeneration, bilateral: Secondary | ICD-10-CM | POA: Diagnosis not present

## 2022-08-24 DIAGNOSIS — H353123 Nonexudative age-related macular degeneration, left eye, advanced atrophic without subfoveal involvement: Secondary | ICD-10-CM | POA: Diagnosis not present

## 2022-09-07 ENCOUNTER — Ambulatory Visit: Payer: MEDICARE | Admitting: Podiatry

## 2022-09-13 ENCOUNTER — Ambulatory Visit: Payer: MEDICARE | Admitting: Podiatry

## 2022-09-21 ENCOUNTER — Other Ambulatory Visit: Payer: Self-pay

## 2022-09-21 ENCOUNTER — Ambulatory Visit (INDEPENDENT_AMBULATORY_CARE_PROVIDER_SITE_OTHER): Payer: MEDICARE | Admitting: Podiatry

## 2022-09-21 ENCOUNTER — Encounter: Payer: Self-pay | Admitting: Podiatry

## 2022-09-21 DIAGNOSIS — I5032 Chronic diastolic (congestive) heart failure: Secondary | ICD-10-CM

## 2022-09-21 DIAGNOSIS — D2372 Other benign neoplasm of skin of left lower limb, including hip: Secondary | ICD-10-CM | POA: Diagnosis not present

## 2022-09-21 DIAGNOSIS — B351 Tinea unguium: Secondary | ICD-10-CM

## 2022-09-21 DIAGNOSIS — M79676 Pain in unspecified toe(s): Secondary | ICD-10-CM

## 2022-09-21 DIAGNOSIS — D2371 Other benign neoplasm of skin of right lower limb, including hip: Secondary | ICD-10-CM

## 2022-09-21 DIAGNOSIS — D689 Coagulation defect, unspecified: Secondary | ICD-10-CM

## 2022-09-21 DIAGNOSIS — E7800 Pure hypercholesterolemia, unspecified: Secondary | ICD-10-CM

## 2022-09-21 DIAGNOSIS — I480 Paroxysmal atrial fibrillation: Secondary | ICD-10-CM

## 2022-09-22 NOTE — Progress Notes (Signed)
She presents today with her husband chief complaint of painful calluses and toenails bilaterally.  No other problems.  Objective: Pulses are palpable no open lesions or wounds multiple benign skin lesions plantar forefoot bilateral.  Toenails are thick yellow dystrophic onychomycotic painful palpation as well as debridement.  Assessment: Pain in limb secondary to onychomycosis and benign skin lesions fat pad atrophy and metatarsalgia.  Plan: I debrided toenails in thickness and length bilateral.  Debrided all benign skin lesions and placed padding.  Follow-up with her in 3 months

## 2022-10-11 ENCOUNTER — Encounter: Payer: MEDICARE | Admitting: Orthopedic Surgery

## 2022-10-13 DIAGNOSIS — H353123 Nonexudative age-related macular degeneration, left eye, advanced atrophic without subfoveal involvement: Secondary | ICD-10-CM | POA: Diagnosis not present

## 2022-10-13 DIAGNOSIS — H43813 Vitreous degeneration, bilateral: Secondary | ICD-10-CM | POA: Diagnosis not present

## 2022-10-13 DIAGNOSIS — H353211 Exudative age-related macular degeneration, right eye, with active choroidal neovascularization: Secondary | ICD-10-CM | POA: Diagnosis not present

## 2022-10-13 DIAGNOSIS — H35373 Puckering of macula, bilateral: Secondary | ICD-10-CM | POA: Diagnosis not present

## 2022-10-18 ENCOUNTER — Other Ambulatory Visit: Payer: MEDICARE

## 2022-10-18 ENCOUNTER — Encounter: Payer: MEDICARE | Admitting: Orthopedic Surgery

## 2022-10-18 ENCOUNTER — Telehealth: Payer: Self-pay

## 2022-10-18 DIAGNOSIS — U071 COVID-19: Secondary | ICD-10-CM

## 2022-10-18 MED ORDER — MOLNUPIRAVIR EUA 200MG CAPSULE: 4.0000 | ORAL_CAPSULE | Freq: Two times a day (BID) | ORAL | 0 refills | Status: AC

## 2022-10-18 NOTE — Telephone Encounter (Signed)
Patient husband called back and request prescription be sent for both of them. Message routed to Windell Moulding, NP.

## 2022-10-18 NOTE — Telephone Encounter (Signed)
Patient's husband called stating that both he and his wife tested positive for COVID over the weekend and they are in quarantine. He states that wife had an upset stomach and vomiting. Is there any type of diet they should be eating and patient also has bad cough she almost loses her breath when she coughs. Please afvise  Message sent to Windell Moulding, NP

## 2022-10-20 ENCOUNTER — Telehealth: Payer: Self-pay

## 2022-10-20 NOTE — Telephone Encounter (Signed)
Incoming call received from patients spouse stating patient needs to be seen today for covid symptoms. Both the patient and her spouse were diagnosed with covid on Saturday and Mr.Runyan is recovering well, however Mrs.Sessums is with a productive cough and increased weakness. I offered patient a video visit in which they refused due to no access to a computer or smart phone. I asked if patient could come to [facility] to be seen and Mr.Deamer replied no, as patient nor him drive and they do not have anyone to bring them. Mr.Overholt mentioned that he knows that Dr.Gupta is at there location today and he will have someone take his wife downstairs to be seen.  I informed Mr.Chong that we do not have a clinic at his location today and that is why I offered a video visit or visit for [facility]. Mr.Mutch asked if I can send a message to Dr.Gupta and see if she can help his wife. Mr.Pitz stated the pharmacy is [facility] @ [address], if [place] plans to send in a RX for cough.

## 2022-10-20 NOTE — Telephone Encounter (Signed)
Talked to the husband. [name] is feeling little better today. Manily feels very weak Continues to eat and Drink Does have cough but no fever or SOB or chest pain I have told him to take her to ED if she c/o Worsening SOB They unfortunately did not take Monuplair as her pharmacy told her it is going to cost $1000.

## 2022-10-31 ENCOUNTER — Other Ambulatory Visit: Payer: Self-pay | Admitting: Internal Medicine

## 2022-11-01 DIAGNOSIS — R2681 Unsteadiness on feet: Secondary | ICD-10-CM | POA: Diagnosis not present

## 2022-11-01 DIAGNOSIS — R278 Other lack of coordination: Secondary | ICD-10-CM | POA: Diagnosis not present

## 2022-11-01 DIAGNOSIS — M6281 Muscle weakness (generalized): Secondary | ICD-10-CM | POA: Diagnosis not present

## 2022-11-03 DIAGNOSIS — M6281 Muscle weakness (generalized): Secondary | ICD-10-CM | POA: Diagnosis not present

## 2022-11-03 DIAGNOSIS — R2681 Unsteadiness on feet: Secondary | ICD-10-CM | POA: Diagnosis not present

## 2022-11-03 DIAGNOSIS — R278 Other lack of coordination: Secondary | ICD-10-CM | POA: Diagnosis not present

## 2022-11-08 ENCOUNTER — Other Ambulatory Visit: Payer: Self-pay | Admitting: Internal Medicine

## 2022-11-08 ENCOUNTER — Other Ambulatory Visit: Payer: MEDICARE

## 2022-11-08 DIAGNOSIS — R2681 Unsteadiness on feet: Secondary | ICD-10-CM | POA: Diagnosis not present

## 2022-11-08 DIAGNOSIS — R278 Other lack of coordination: Secondary | ICD-10-CM | POA: Diagnosis not present

## 2022-11-08 DIAGNOSIS — I480 Paroxysmal atrial fibrillation: Secondary | ICD-10-CM | POA: Diagnosis not present

## 2022-11-08 DIAGNOSIS — E7800 Pure hypercholesterolemia, unspecified: Secondary | ICD-10-CM | POA: Diagnosis not present

## 2022-11-08 DIAGNOSIS — M6281 Muscle weakness (generalized): Secondary | ICD-10-CM | POA: Diagnosis not present

## 2022-11-08 DIAGNOSIS — I5032 Chronic diastolic (congestive) heart failure: Secondary | ICD-10-CM | POA: Diagnosis not present

## 2022-11-08 NOTE — Telephone Encounter (Signed)
Pharmacy requested refill.  Pended Rx's and sent to Dr. Gupta for approval due to HIGH ALERT Warning.  

## 2022-11-09 LAB — CBC WITH DIFFERENTIAL/PLATELET
Absolute Monocytes: 468 cells/uL (ref 200–950)
Basophils Absolute: 31 cells/uL (ref 0–200)
Basophils Relative: 0.6 %
Eosinophils Absolute: 52 cells/uL (ref 15–500)
Eosinophils Relative: 1 %
HCT: 35.9 % (ref 35.0–45.0)
Hemoglobin: 11.5 g/dL — ABNORMAL LOW (ref 11.7–15.5)
Lymphs Abs: 884 cells/uL (ref 850–3900)
MCH: 27.6 pg (ref 27.0–33.0)
MCHC: 32 g/dL (ref 32.0–36.0)
MCV: 86.1 fL (ref 80.0–100.0)
MPV: 11.7 fL (ref 7.5–12.5)
Monocytes Relative: 9 %
Neutro Abs: 3765 cells/uL (ref 1500–7800)
Neutrophils Relative %: 72.4 %
Platelets: 200 10*3/uL (ref 140–400)
RBC: 4.17 10*6/uL (ref 3.80–5.10)
RDW: 14 % (ref 11.0–15.0)
Total Lymphocyte: 17 %
WBC: 5.2 10*3/uL (ref 3.8–10.8)

## 2022-11-09 LAB — COMPLETE METABOLIC PANEL WITH GFR
AG Ratio: 1.5 (calc) (ref 1.0–2.5)
ALT: 13 U/L (ref 6–29)
AST: 15 U/L (ref 10–35)
Albumin: 3.7 g/dL (ref 3.6–5.1)
Alkaline phosphatase (APISO): 72 U/L (ref 37–153)
BUN/Creatinine Ratio: 34 (calc) — ABNORMAL HIGH (ref 6–22)
BUN: 41 mg/dL — ABNORMAL HIGH (ref 7–25)
CO2: 27 mmol/L (ref 20–32)
Calcium: 8.6 mg/dL (ref 8.6–10.4)
Chloride: 107 mmol/L (ref 98–110)
Creat: 1.22 mg/dL — ABNORMAL HIGH (ref 0.60–0.95)
Globulin: 2.4 g/dL (calc) (ref 1.9–3.7)
Glucose, Bld: 123 mg/dL — ABNORMAL HIGH (ref 65–99)
Potassium: 3.7 mmol/L (ref 3.5–5.3)
Sodium: 143 mmol/L (ref 135–146)
Total Bilirubin: 0.5 mg/dL (ref 0.2–1.2)
Total Protein: 6.1 g/dL (ref 6.1–8.1)
eGFR: 40 mL/min/{1.73_m2} — ABNORMAL LOW (ref >=60)

## 2022-11-09 LAB — LIPID PANEL
Cholesterol: 124 mg/dL (ref <200)
HDL: 47 mg/dL — ABNORMAL LOW (ref >=50)
LDL Cholesterol (Calc): 56 mg/dL (calc)
Non-HDL Cholesterol (Calc): 77 mg/dL (calc) (ref <130)
Total CHOL/HDL Ratio: 2.6 (calc) (ref <5.0)
Triglycerides: 128 mg/dL (ref <150)

## 2022-11-09 LAB — TSH: TSH: 6.7 mIU/L — ABNORMAL HIGH (ref 0.40–4.50)

## 2022-11-11 DIAGNOSIS — R278 Other lack of coordination: Secondary | ICD-10-CM | POA: Diagnosis not present

## 2022-11-11 DIAGNOSIS — M6281 Muscle weakness (generalized): Secondary | ICD-10-CM | POA: Diagnosis not present

## 2022-11-11 DIAGNOSIS — R2681 Unsteadiness on feet: Secondary | ICD-10-CM | POA: Diagnosis not present

## 2022-11-15 DIAGNOSIS — H04123 Dry eye syndrome of bilateral lacrimal glands: Secondary | ICD-10-CM | POA: Diagnosis not present

## 2022-11-15 DIAGNOSIS — H353211 Exudative age-related macular degeneration, right eye, with active choroidal neovascularization: Secondary | ICD-10-CM | POA: Diagnosis not present

## 2022-11-15 DIAGNOSIS — Z961 Presence of intraocular lens: Secondary | ICD-10-CM | POA: Diagnosis not present

## 2022-11-15 DIAGNOSIS — H353122 Nonexudative age-related macular degeneration, left eye, intermediate dry stage: Secondary | ICD-10-CM | POA: Diagnosis not present

## 2022-11-16 DIAGNOSIS — R2681 Unsteadiness on feet: Secondary | ICD-10-CM | POA: Diagnosis not present

## 2022-11-16 DIAGNOSIS — R278 Other lack of coordination: Secondary | ICD-10-CM | POA: Diagnosis not present

## 2022-11-16 DIAGNOSIS — M6281 Muscle weakness (generalized): Secondary | ICD-10-CM | POA: Diagnosis not present

## 2022-11-17 ENCOUNTER — Non-Acute Institutional Stay: Payer: MEDICARE | Admitting: Internal Medicine

## 2022-11-17 ENCOUNTER — Encounter: Payer: Self-pay | Admitting: Internal Medicine

## 2022-11-17 VITALS — BP 124/82 | HR 88 | Temp 97.7°F | Resp 16 | Wt 161.8 lb

## 2022-11-17 DIAGNOSIS — U099 Post covid-19 condition, unspecified: Secondary | ICD-10-CM | POA: Diagnosis not present

## 2022-11-17 DIAGNOSIS — Z853 Personal history of malignant neoplasm of breast: Secondary | ICD-10-CM

## 2022-11-17 DIAGNOSIS — B351 Tinea unguium: Secondary | ICD-10-CM | POA: Diagnosis not present

## 2022-11-17 DIAGNOSIS — E7800 Pure hypercholesterolemia, unspecified: Secondary | ICD-10-CM

## 2022-11-17 DIAGNOSIS — I5032 Chronic diastolic (congestive) heart failure: Secondary | ICD-10-CM | POA: Diagnosis not present

## 2022-11-17 DIAGNOSIS — I480 Paroxysmal atrial fibrillation: Secondary | ICD-10-CM

## 2022-11-17 DIAGNOSIS — G3184 Mild cognitive impairment, so stated: Secondary | ICD-10-CM

## 2022-11-17 DIAGNOSIS — K219 Gastro-esophageal reflux disease without esophagitis: Secondary | ICD-10-CM

## 2022-11-17 DIAGNOSIS — I10 Essential (primary) hypertension: Secondary | ICD-10-CM

## 2022-11-17 DIAGNOSIS — M542 Cervicalgia: Secondary | ICD-10-CM

## 2022-11-17 NOTE — Progress Notes (Signed)
Location:  [facility] of Service:  Clinic (12)  Provider:   Code Status: DNR Goals of Care:     11/17/2022   10:09 AM  Advanced Directives  Does Patient Have a Medical Advance Directive? Yes  Type of Estate agent of [place];Living will;Out of facility DNR (pink MOST or yellow form)  Does patient want to make changes to medical advance directive? No - Patient declined     Chief Complaint  Patient presents with   Medical Management of Chronic Issues    Patient is being seen for a 6 month follow up with labs    Immunizations    Discussed the need for shingles, tdap and covid vaccine. NCIR verified    Quality Metric Gaps    Discuss the need for AWV    HPI: Patient is a 89 y.o. female seen today for medical management of chronic diseases.   [place]  Recently got diagnosed with COVID.  Had cough and shortness of breath and weakness.  But patient eventually recovered .  She continues to do well and walk with her walker.  Has not had any falls.  She is progressively though getting a little weaker.  Her husband is 50 years old.  They have hired caregivers couple of for help  Short-term memory is poor.  Her husband is now managing medicine Appetite is poor has lost some weight but continues to push herself and drinks boost.  Has history of bilateral breast cancer s/p lumpectomy and  restarted back on tamoxifen Mammogram pending  Diastolic CHF with lower extremity edema doing well on Lasix AF on Eliquis Past Medical History:  Diagnosis Date   Allergic rhinitis, seasonal    Breast cancer of upper-outer quadrant of right female breast (HCC) 09/13/2014   Chronic venous insufficiency    Dermatophytosis of nail    Dysrhythmia    H/o Atrial fibrillation   Edema leg    Family history of malignant neoplasm of breast    Family history of malignant neoplasm of ovary    Goiter    right thyroid nodule     HTN (hypertension) 08/13/2017   HX: breast cancer    bilateral   Menopausal syndrome    Osteoarthritis    Osteoporosis    Squamous cell skin cancer    right lower leg   Wears glasses    Wears hearing aid    both ears    Past Surgical History:  Procedure Laterality Date   bilateral lumpectomies for breast cancer  Aug. 2007   BREAST LUMPECTOMY WITH RADIOACTIVE SEED LOCALIZATION Right 10/01/2014   Procedure: BREAST LUMPECTOMY WITH RADIOACTIVE SEED LOCALIZATION;  Surgeon: Harriette Bouillon, MD;  Location: [facility];  Service: General;  Laterality: Right;   BREAST LUMPECTOMY WITH RADIOACTIVE SEED LOCALIZATION Left 03/22/2019   Procedure: LEFT BREAST LUMPECTOMY WITH RADIOACTIVE SEED LOCALIZATION;  Surgeon: Harriette Bouillon, MD;  Location: [facility];  Service: General;  Laterality: Left;   BREAST LUMPECTOMY WITH RADIOACTIVE SEED LOCALIZATION Right 08/18/2021   Procedure: RIGHT BREAST LUMPECTOMY WITH RADIOACTIVE SEED LOCALIZATION;  Surgeon: Harriette Bouillon, MD;  Location: [facility];  Service: General;  Laterality: Right;   CATARACT EXTRACTION     CHOLECYSTECTOMY N/A 08/13/2017   Procedure: LAPAROSCOPIC CHOLECYSTECTOMY WITH INTRAOPERATIVE CHOLANGIOGRAM;  Surgeon: Glenna Fellows, MD;  [facility];  Service: General;  Laterality: N/A;   DILATION AND CURETTAGE OF UTERUS  ESOPHAGOGASTRODUODENOSCOPY  07/26/2011   Procedure: ESOPHAGOGASTRODUODENOSCOPY (EGD);  Surgeon: Petra Kuba, MD;  Location: [facility];  Service: Endoscopy;  Laterality: N/A;   history of lumbar compression fracture     left eardum sx  1983   left knee arthroscopic surgery     no screening colonoscopy     s/p bilateral lumpectomies     SAVORY DILATION  07/26/2011   Procedure: SAVORY DILATION;  Surgeon: Petra Kuba, MD;  [facility];  Service: Endoscopy;  Laterality: N/A;   status post resection squamous cell cancer right lower leg     TONSILLECTOMY     UMBILICAL HERNIA REPAIR      Allergies   Allergen Reactions   Celecoxib Swelling    Swelling-leg    Outpatient Encounter Medications as of 11/17/2022  Medication Sig   apixaban (ELIQUIS) 5 MG TABS tablet Take 1 tablet (5 mg total) by mouth 2 (two) times daily.   atorvastatin (LIPITOR) 40 MG tablet TAKE 1 TABLET BY MOUTH EVERY DAY   furosemide (LASIX) 40 MG tablet TAKE 1 TABLET BY MOUTH EVERY DAY   hydroxypropyl methylcellulose / hypromellose (ISOPTO TEARS / GONIOVISC) 2.5 % ophthalmic solution Place 1 drop into both eyes in the morning and at bedtime.   ipratropium (ATROVENT) 0.06 % nasal spray PLACE 2 SPRAYS INTO BOTH NOSTRILS 4 TIMES A DAY   ketoconazole (NIZORAL) 2 % cream Apply 1 application. topically 2 (two) times daily as needed for irritation.   KLOR-CON M20 20 MEQ tablet TAKE 1 TABLET BY MOUTH EVERY DAY   metoprolol succinate (TOPROL-XL) 25 MG 24 hr tablet TAKE 1 TABLET BY MOUTH EVERYDAY AT BEDTIME   montelukast (SINGULAIR) 10 MG tablet TAKE 1 TABLET BY MOUTH AT NIGHT FOR COUGH OR WHEEZING   Multiple Vitamins-Minerals (PRESERVISION AREDS 2) CAPS Take 1 capsule by mouth 2 (two) times daily.    pantoprazole (PROTONIX) 40 MG tablet TAKE 1 TABLET BY MOUTH EVERY DAY   tamoxifen (NOLVADEX) 20 MG tablet TAKE 1 TABLET BY MOUTH EVERY DAY   tobramycin (TOBREX) 0.3 % ophthalmic solution 1 drop 4 (four) times daily.   No facility-administered encounter medications on file as of 11/17/2022.    Review of Systems:  Review of Systems  Constitutional:  Negative for activity change and appetite change.  HENT: Negative.    Respiratory:  Negative for cough and shortness of breath.   Cardiovascular:  Positive for leg swelling.  Gastrointestinal:  Negative for constipation.  Genitourinary: Negative.   Musculoskeletal:  Positive for gait problem. Negative for arthralgias and myalgias.  Skin: Negative.   Neurological:  Positive for weakness. Negative for dizziness.  Psychiatric/Behavioral:  Negative for confusion, dysphoric mood and sleep  disturbance.     Health Maintenance  Topic Date Due   Zoster Vaccines- Shingrix (1 of 2) Never done   DTaP/Tdap/Td (2 - Tdap) 04/19/2015   COVID-19 Vaccine (5 - 2023-24 season) 03/19/2022   Medicare Annual Wellness (AWV)  10/13/2022   INFLUENZA VACCINE  02/17/2023   Pneumonia Vaccine 56+ Years old  Completed   DEXA SCAN  Completed   HPV VACCINES  Aged Out   MAMMOGRAM  Discontinued    Physical Exam: Vitals:   11/17/22 1005  BP: 124/82  Pulse: 88  Resp: 16  Temp: 97.7 F (36.5 C)  TempSrc: Temporal  SpO2: 98%  Weight: 161 lb 12.8 oz (73.4 kg)   Body mass index is 31.6 kg/m. Physical Exam Vitals reviewed.  Constitutional:      Appearance:  Normal appearance.  HENT:     Head: Normocephalic.     Nose: Nose normal.     Mouth/Throat:     Mouth: Mucous membranes are moist.     Pharynx: Oropharynx is clear.  Eyes:     Pupils: Pupils are equal, round, and reactive to light.  Cardiovascular:     Rate and Rhythm: Normal rate and regular rhythm.     Pulses: Normal pulses.     Heart sounds: Normal heart sounds. No murmur heard. Pulmonary:     Effort: Pulmonary effort is normal.     Breath sounds: Normal breath sounds.  Abdominal:     General: Abdomen is flat. Bowel sounds are normal.     Palpations: Abdomen is soft.  Musculoskeletal:        General: Swelling present.     Cervical back: Neck supple.  Skin:    General: Skin is warm.  Neurological:     General: No focal deficit present.     Mental Status: She is alert and oriented to person, place, and time.  Psychiatric:        Mood and Affect: Mood normal.        Thought Content: Thought content normal.    Labs reviewed: Basic Metabolic Panel: Recent Labs    05/12/22 1532 07/22/22 1546 11/08/22 0807  NA 142 142 143  K 4.6 4.6 3.7  CL 104 106 107  CO2 25 28 27   GLUCOSE 128* 108* 123*  BUN 35 32* 41*  CREATININE 1.22* 1.19* 1.22*  CALCIUM 9.0 9.1 8.6  MG 1.8  --   --   TSH 3.160  --  6.70*   Liver  Function Tests: Recent Labs    07/22/22 1546 11/08/22 0807  AST 16 15  ALT 13 13  ALKPHOS 74  --   BILITOT 0.4 0.5  PROT 6.1* 6.1  ALBUMIN 3.8  --    No results for input(s): "LIPASE", "AMYLASE" in the last 8760 hours. No results for input(s): "AMMONIA" in the last 8760 hours. CBC: Recent Labs    05/12/22 1532 11/08/22 0807  WBC 6.6 5.2  NEUTROABS  --  3,765  HGB 11.4 11.5*  HCT 34.6 35.9  MCV 90 86.1  PLT 182 200   Lipid Panel: Recent Labs    11/08/22 0807  CHOL 124  HDL 47*  LDLCALC 56  TRIG 161  CHOLHDL 2.6   Lab Results  Component Value Date   HGBA1C 5.8 (H) 01/01/2021    Procedures since last visit: No results found.  Assessment/Plan 1. Post covid-19 condition, unspecified Feels Weak but overall has recovered  2. Chronic diastolic heart failure (HCC) Continue on Lasix 40 mg  Creat stable 3. Hypercholesterolemia Labs stable On Lipitor  4. Paroxysmal atrial fibrillation (HCC) Eliquis and Toprol Takes extra dose of Toprol if palpitations  5. Neck pain Resolved mostly  6. Essential hypertension BP controlled on Toprol and Eliquis  7. Gastroesophageal reflux disease, unspecified whether esophagitis present On Protonix  8. BREAST CANCER, HX OF On Tamoxifen Mammogram Pending  9. Onychomycosis Follows with Podiatry  10. Mild cognitive impairment Noticed by Husband Needs Constant reminders They both have looked into AL but dont want to move right now Last MMSE in 03/23 was 28/30    Labs/tests ordered:  * No order type specified * Next appt:  01/03/2023

## 2022-11-18 DIAGNOSIS — M6281 Muscle weakness (generalized): Secondary | ICD-10-CM | POA: Diagnosis not present

## 2022-11-18 DIAGNOSIS — R2681 Unsteadiness on feet: Secondary | ICD-10-CM | POA: Diagnosis not present

## 2022-11-18 DIAGNOSIS — R278 Other lack of coordination: Secondary | ICD-10-CM | POA: Diagnosis not present

## 2022-11-20 ENCOUNTER — Other Ambulatory Visit: Payer: Self-pay | Admitting: Internal Medicine

## 2022-11-22 DIAGNOSIS — R278 Other lack of coordination: Secondary | ICD-10-CM | POA: Diagnosis not present

## 2022-11-22 DIAGNOSIS — R2681 Unsteadiness on feet: Secondary | ICD-10-CM | POA: Diagnosis not present

## 2022-11-22 DIAGNOSIS — M6281 Muscle weakness (generalized): Secondary | ICD-10-CM | POA: Diagnosis not present

## 2022-11-23 DIAGNOSIS — R278 Other lack of coordination: Secondary | ICD-10-CM | POA: Diagnosis not present

## 2022-11-23 DIAGNOSIS — R2681 Unsteadiness on feet: Secondary | ICD-10-CM | POA: Diagnosis not present

## 2022-11-23 DIAGNOSIS — M6281 Muscle weakness (generalized): Secondary | ICD-10-CM | POA: Diagnosis not present

## 2022-11-25 ENCOUNTER — Other Ambulatory Visit: Payer: MEDICARE

## 2022-11-25 DIAGNOSIS — R2681 Unsteadiness on feet: Secondary | ICD-10-CM | POA: Diagnosis not present

## 2022-11-25 DIAGNOSIS — M6281 Muscle weakness (generalized): Secondary | ICD-10-CM | POA: Diagnosis not present

## 2022-11-25 DIAGNOSIS — R278 Other lack of coordination: Secondary | ICD-10-CM | POA: Diagnosis not present

## 2022-11-27 ENCOUNTER — Other Ambulatory Visit: Payer: Self-pay

## 2022-11-27 ENCOUNTER — Encounter (HOSPITAL_COMMUNITY): Payer: Self-pay

## 2022-11-27 ENCOUNTER — Emergency Department (HOSPITAL_COMMUNITY): Payer: MEDICARE

## 2022-11-27 ENCOUNTER — Observation Stay (HOSPITAL_COMMUNITY)
Admission: EM | Admit: 2022-11-27 | Discharge: 2022-11-28 | Disposition: A | Discharge status: Home / Self Care | Payer: MEDICARE | Attending: Internal Medicine | Admitting: Internal Medicine

## 2022-11-27 DIAGNOSIS — I10 Essential (primary) hypertension: Secondary | ICD-10-CM | POA: Diagnosis present

## 2022-11-27 DIAGNOSIS — R0789 Other chest pain: Secondary | ICD-10-CM | POA: Insufficient documentation

## 2022-11-27 DIAGNOSIS — E876 Hypokalemia: Secondary | ICD-10-CM | POA: Diagnosis not present

## 2022-11-27 DIAGNOSIS — E785 Hyperlipidemia, unspecified: Secondary | ICD-10-CM | POA: Insufficient documentation

## 2022-11-27 DIAGNOSIS — Z853 Personal history of malignant neoplasm of breast: Secondary | ICD-10-CM | POA: Diagnosis not present

## 2022-11-27 DIAGNOSIS — N1832 Chronic kidney disease, stage 3b: Secondary | ICD-10-CM | POA: Diagnosis not present

## 2022-11-27 DIAGNOSIS — E669 Obesity, unspecified: Secondary | ICD-10-CM | POA: Diagnosis not present

## 2022-11-27 DIAGNOSIS — Z6832 Body mass index (BMI) 32.0-32.9, adult: Secondary | ICD-10-CM | POA: Diagnosis not present

## 2022-11-27 DIAGNOSIS — R7989 Other specified abnormal findings of blood chemistry: Secondary | ICD-10-CM

## 2022-11-27 DIAGNOSIS — I5032 Chronic diastolic (congestive) heart failure: Secondary | ICD-10-CM | POA: Insufficient documentation

## 2022-11-27 DIAGNOSIS — Z85828 Personal history of other malignant neoplasm of skin: Secondary | ICD-10-CM | POA: Diagnosis not present

## 2022-11-27 DIAGNOSIS — Z9889 Other specified postprocedural states: Secondary | ICD-10-CM | POA: Insufficient documentation

## 2022-11-27 DIAGNOSIS — R531 Weakness: Secondary | ICD-10-CM | POA: Diagnosis not present

## 2022-11-27 DIAGNOSIS — K219 Gastro-esophageal reflux disease without esophagitis: Secondary | ICD-10-CM | POA: Diagnosis present

## 2022-11-27 DIAGNOSIS — I480 Paroxysmal atrial fibrillation: Secondary | ICD-10-CM | POA: Insufficient documentation

## 2022-11-27 DIAGNOSIS — R079 Chest pain, unspecified: Secondary | ICD-10-CM

## 2022-11-27 DIAGNOSIS — Z79899 Other long term (current) drug therapy: Secondary | ICD-10-CM | POA: Insufficient documentation

## 2022-11-27 DIAGNOSIS — I130 Hypertensive heart and chronic kidney disease with heart failure and stage 1 through stage 4 chronic kidney disease, or unspecified chronic kidney disease: Secondary | ICD-10-CM | POA: Insufficient documentation

## 2022-11-27 DIAGNOSIS — D500 Iron deficiency anemia secondary to blood loss (chronic): Secondary | ICD-10-CM | POA: Insufficient documentation

## 2022-11-27 DIAGNOSIS — Z7901 Long term (current) use of anticoagulants: Secondary | ICD-10-CM | POA: Diagnosis not present

## 2022-11-27 DIAGNOSIS — I214 Non-ST elevation (NSTEMI) myocardial infarction: Principal | ICD-10-CM | POA: Insufficient documentation

## 2022-11-27 DIAGNOSIS — N183 Chronic kidney disease, stage 3 unspecified: Secondary | ICD-10-CM | POA: Diagnosis present

## 2022-11-27 DIAGNOSIS — R778 Other specified abnormalities of plasma proteins: Secondary | ICD-10-CM | POA: Insufficient documentation

## 2022-11-27 DIAGNOSIS — I959 Hypotension, unspecified: Secondary | ICD-10-CM | POA: Diagnosis not present

## 2022-11-27 DIAGNOSIS — I2510 Atherosclerotic heart disease of native coronary artery without angina pectoris: Secondary | ICD-10-CM | POA: Diagnosis not present

## 2022-11-27 LAB — COMPREHENSIVE METABOLIC PANEL
ALT: 15 U/L (ref 0–44)
AST: 21 U/L (ref 15–41)
Albumin: 3 g/dL — ABNORMAL LOW (ref 3.5–5.0)
Alkaline Phosphatase: 64 U/L (ref 38–126)
Anion gap: 10 (ref 5–15)
BUN: 44 mg/dL — ABNORMAL HIGH (ref 8–23)
CO2: 22 mmol/L (ref 22–32)
Calcium: 8.6 mg/dL — ABNORMAL LOW (ref 8.9–10.3)
Chloride: 109 mmol/L (ref 98–111)
Creatinine, Ser: 1.18 mg/dL — ABNORMAL HIGH (ref 0.44–1.00)
GFR, Estimated: 42 mL/min — ABNORMAL LOW (ref >60)
Glucose, Bld: 139 mg/dL — ABNORMAL HIGH (ref 70–99)
Potassium: 4 mmol/L (ref 3.5–5.1)
Sodium: 141 mmol/L (ref 135–145)
Total Bilirubin: 0.5 mg/dL (ref 0.3–1.2)
Total Protein: 5.7 g/dL — ABNORMAL LOW (ref 6.5–8.1)

## 2022-11-27 LAB — BRAIN NATRIURETIC PEPTIDE: B Natriuretic Peptide: 260.3 pg/mL — ABNORMAL HIGH (ref 0.0–100.0)

## 2022-11-27 LAB — CBC
HCT: 35.5 % — ABNORMAL LOW (ref 36.0–46.0)
Hemoglobin: 10.6 g/dL — ABNORMAL LOW (ref 12.0–15.0)
MCH: 27 pg (ref 26.0–34.0)
MCHC: 29.9 g/dL — ABNORMAL LOW (ref 30.0–36.0)
MCV: 90.3 fL (ref 80.0–100.0)
Platelets: 165 10*3/uL (ref 150–400)
RBC: 3.93 MIL/uL (ref 3.87–5.11)
RDW: 14.8 % (ref 11.5–15.5)
WBC: 6.7 10*3/uL (ref 4.0–10.5)
nRBC: 0 % (ref 0.0–0.2)

## 2022-11-27 LAB — HEPARIN LEVEL (UNFRACTIONATED): Heparin Unfractionated: >1.1 IU/mL — ABNORMAL HIGH (ref 0.30–0.70)

## 2022-11-27 LAB — CBG MONITORING, ED: Glucose-Capillary: 125 mg/dL — ABNORMAL HIGH (ref 70–99)

## 2022-11-27 LAB — TROPONIN I (HIGH SENSITIVITY)
Troponin I (High Sensitivity): 27 ng/L — ABNORMAL HIGH (ref <18)
Troponin I (High Sensitivity): 49 ng/L — ABNORMAL HIGH (ref <18)
Troponin I (High Sensitivity): 47 ng/L — ABNORMAL HIGH (ref <18)

## 2022-11-27 LAB — MAGNESIUM: Magnesium: 1.4 mg/dL — ABNORMAL LOW (ref 1.7–2.4)

## 2022-11-27 LAB — LIPASE, BLOOD: Lipase: 29 U/L (ref 11–51)

## 2022-11-27 LAB — APTT: aPTT: 32 seconds (ref 24–36)

## 2022-11-27 MED ORDER — ACETAMINOPHEN 325 MG PO TABS: 650.0000 mg | ORAL_TABLET | Freq: Four times a day (QID) | ORAL | Status: DC | PRN

## 2022-11-27 MED ORDER — ASPIRIN 81 MG PO CHEW
324.0000 mg | CHEWABLE_TABLET | Freq: Once | ORAL | Status: AC
  Administered 2022-11-27: 324 mg via ORAL
  Filled 2022-11-27: qty 4

## 2022-11-27 MED ORDER — MAGNESIUM SULFATE IN D5W 1-5 GM/100ML-% IV SOLN
1.0000 g | Freq: Once | INTRAVENOUS | Status: AC
  Administered 2022-11-27: 1 g via INTRAVENOUS
  Filled 2022-11-27: qty 100

## 2022-11-27 MED ORDER — ACETAMINOPHEN 650 MG RE SUPP: 650.0000 mg | Freq: Four times a day (QID) | RECTAL | Status: DC | PRN

## 2022-11-27 MED ORDER — HEPARIN (PORCINE) 25000 UT/250ML-% IV SOLN
750.0000 [IU]/h | INTRAVENOUS | Status: DC
  Administered 2022-11-27: 750 [IU]/h via INTRAVENOUS
  Filled 2022-11-27: qty 250

## 2022-11-27 NOTE — ED Notes (Signed)
Help get patient on the the bedpan patient is now off resting with call bell in reach

## 2022-11-27 NOTE — H&P (Signed)
History and Physical    [name] ZOX:[id number] DOB: 1921-07-22 DOA: 11/27/2022  PCP: Mahlon Gammon, MD  Patient coming from: Home  Chief Complaint: Chest pain  HPI: [name] is a 89 y.o. female with medical history significant of CAD, chronic HFpEF, hyperlipidemia, paroxysmal A-fib on Eliquis, hypertension, GERD, history of breast cancer, CKD stage IIIb presented to ED with chest pain.  Slightly tachycardic on arrival but remainder vital signs stable.  Labs showing WBC 6.7, hemoglobin 10.6 (baseline 11-12 range), platelet count 165k, sodium 141, potassium 4.0, chloride 109, bicarb 22, BUN 44, creatinine 1.1, glucose 139, normal lipase and LFTs, troponin 27> 49, BNP 260, magnesium 1.4.  Chest x-ray showing no active disease.  EKG showing new mild ST depressions in lateral leads.  Patient received aspirin 324 mg and IV magnesium 1 g.  ED physician discussed the case with the on-call cardiologist who recommended starting IV heparin and will see the patient in consultation.  [facility] called to admit.  History provided by the patient and her niece at bedside.  Patient resides at an assisted living facility and this afternoon after having lunch as she was walking back to her apartment, she felt short of breath and "just did not feel right."  Patient denies experiencing any chest pain or pressure.  Denies associated nausea, vomiting, or diaphoresis.  She denies history of prior episodes of exertional chest pain or dyspnea.  This afternoon her symptoms resolved after she rested.  She denies history of blood clots and has no other complaints.  Review of Systems:  Review of Systems  All other systems reviewed and are negative.   Past Medical History:  Diagnosis Date   Allergic rhinitis, seasonal    Breast cancer of upper-outer quadrant of right female breast (HCC) 09/13/2014   Chronic venous insufficiency    Dermatophytosis of nail    Dysrhythmia    H/o Atrial fibrillation   Edema leg     Family history of malignant neoplasm of breast    Family history of malignant neoplasm of ovary    Goiter    right thyroid nodule    HTN (hypertension) 08/13/2017   HX: breast cancer    bilateral   Menopausal syndrome    Osteoarthritis    Osteoporosis    Squamous cell skin cancer    right lower leg   Wears glasses    Wears hearing aid    both ears    Past Surgical History:  Procedure Laterality Date   bilateral lumpectomies for breast cancer  Aug. 2007   BREAST LUMPECTOMY WITH RADIOACTIVE SEED LOCALIZATION Right 10/01/2014   Procedure: BREAST LUMPECTOMY WITH RADIOACTIVE SEED LOCALIZATION;  Surgeon: Harriette Bouillon, MD;  Location: [facility];  Service: General;  Laterality: Right;   BREAST LUMPECTOMY WITH RADIOACTIVE SEED LOCALIZATION Left 03/22/2019   Procedure: LEFT BREAST LUMPECTOMY WITH RADIOACTIVE SEED LOCALIZATION;  Surgeon: Harriette Bouillon, MD;  Location: [facility];  Service: General;  Laterality: Left;   BREAST LUMPECTOMY WITH RADIOACTIVE SEED LOCALIZATION Right 08/18/2021   Procedure: RIGHT BREAST LUMPECTOMY WITH RADIOACTIVE SEED LOCALIZATION;  Surgeon: Harriette Bouillon, MD;  Location: [facility];  Service: General;  Laterality: Right;   CATARACT EXTRACTION     CHOLECYSTECTOMY N/A 08/13/2017   Procedure: LAPAROSCOPIC CHOLECYSTECTOMY WITH INTRAOPERATIVE CHOLANGIOGRAM;  Surgeon: Glenna Fellows, MD;  Location: [facility];  Service: General;  Laterality: N/A;   DILATION AND CURETTAGE OF UTERUS     ESOPHAGOGASTRODUODENOSCOPY  07/26/2011   Procedure: ESOPHAGOGASTRODUODENOSCOPY (EGD);  Surgeon:  Petra Kuba, MD;  Location: [facility];  Service: Endoscopy;  Laterality: N/A;   history of lumbar compression fracture     left eardum sx  1983   left knee arthroscopic surgery     no screening colonoscopy     s/p bilateral lumpectomies     SAVORY DILATION  07/26/2011   Procedure: SAVORY DILATION;  Surgeon: Petra Kuba, MD;  Location: [facility];  Service: Endoscopy;  Laterality: N/A;    status post resection squamous cell cancer right lower leg     TONSILLECTOMY     UMBILICAL HERNIA REPAIR       reports that she has never smoked. She has never used smokeless tobacco. She reports that she does not drink alcohol and does not use drugs.  Allergies  Allergen Reactions   Celecoxib Swelling    Swelling-leg    Family History  Problem Relation Age of Onset   Cancer Mother 5       breast   Heart attack Father    Heart attack Brother    Heart attack Brother    Cancer Maternal Aunt 30       breast   Coronary artery disease Other    Cancer Other        ovarian; daughter of mat aunt w/ BC in 77s   Heart disease Brother    Cancer Maternal Aunt 60       breast    Prior to Admission medications   Medication Sig Start Date End Date Taking? Authorizing Provider  apixaban (ELIQUIS) 5 MG TABS tablet Take 1 tablet (5 mg total) by mouth 2 (two) times daily. 11/27/21   Croitoru, Mihai, MD  atorvastatin (LIPITOR) 40 MG tablet TAKE 1 TABLET BY MOUTH EVERY DAY 11/08/22   Mahlon Gammon, MD  furosemide (LASIX) 40 MG tablet TAKE 1 TABLET BY MOUTH EVERY DAY 11/08/22   Mahlon Gammon, MD  hydroxypropyl methylcellulose / hypromellose (ISOPTO TEARS / GONIOVISC) 2.5 % ophthalmic solution Place 1 drop into both eyes in the morning and at bedtime.    [provider]  ipratropium (ATROVENT) 0.06 % nasal spray PLACE 2 SPRAYS INTO BOTH NOSTRILS 4 TIMES A DAY 12/07/21   Kozlow, Alvira Philips, MD  ketoconazole (NIZORAL) 2 % cream Apply 1 application. topically 2 (two) times daily as needed for irritation.    [provider]  KLOR-CON M20 20 MEQ tablet TAKE 1 TABLET BY MOUTH EVERY DAY 11/08/22   Mahlon Gammon, MD  metoprolol succinate (TOPROL-XL) 25 MG 24 hr tablet TAKE 1 TABLET BY MOUTH EVERYDAY AT BEDTIME 11/22/22   Mahlon Gammon, MD  montelukast (SINGULAIR) 10 MG tablet TAKE 1 TABLET BY MOUTH AT NIGHT FOR COUGH OR WHEEZING 08/16/22   Kozlow, Alvira Philips, MD  Multiple Vitamins-Minerals  (PRESERVISION AREDS 2) CAPS Take 1 capsule by mouth 2 (two) times daily.     [provider]  pantoprazole (PROTONIX) 40 MG tablet TAKE 1 TABLET BY MOUTH EVERY DAY 11/01/22   Mahlon Gammon, MD  tamoxifen (NOLVADEX) 20 MG tablet TAKE 1 TABLET BY MOUTH EVERY DAY 08/05/22   Rachel Moulds, MD  tobramycin (TOBREX) 0.3 % ophthalmic solution 1 drop 4 (four) times daily. 10/04/22   [provider]    Physical Exam: Vitals:   11/27/22 1815 11/27/22 1900 11/27/22 1929 11/27/22 1930  BP: (!) 121/46 (!) 130/47  (!) 120/108  Pulse: 75 76  72  Resp: 20 (!) 21  14  Temp:  97.9 F (36.6 C)   TempSrc:   Oral   SpO2: 96% 100%  99%  Weight:      Height:        Physical Exam Vitals reviewed.  Constitutional:      General: She is not in acute distress. HENT:     Head: Normocephalic and atraumatic.  Eyes:     Extraocular Movements: Extraocular movements intact.  Cardiovascular:     Rate and Rhythm: Normal rate and regular rhythm.     Pulses: Normal pulses.  Pulmonary:     Effort: Pulmonary effort is normal. No respiratory distress.     Breath sounds: Normal breath sounds. No wheezing or rales.  Abdominal:     General: Bowel sounds are normal. There is no distension.     Palpations: Abdomen is soft.     Tenderness: There is no abdominal tenderness.  Musculoskeletal:     Cervical back: Normal range of motion.     Right lower leg: Edema present.     Left lower leg: Edema present.     Comments: Mild bilateral lower extremity edema  Skin:    General: Skin is warm and dry.  Neurological:     General: No focal deficit present.     Mental Status: She is alert and oriented to person, place, and time.     Labs on Admission: I have personally reviewed following labs and imaging studies  CBC: Recent Labs  Lab 11/27/22 1456  WBC 6.7  HGB 10.6*  HCT 35.5*  MCV 90.3  PLT 165   Basic Metabolic Panel: Recent Labs  Lab 11/27/22 1456  NA 141  K 4.0  CL 109  CO2 22   GLUCOSE 139*  BUN 44*  CREATININE 1.18*  CALCIUM 8.6*  MG 1.4*   GFR: Estimated Creatinine Clearance: 24.2 mL/min (A) (by C-G formula based on SCr of 1.18 mg/dL (H)). Liver Function Tests: Recent Labs  Lab 11/27/22 1456  AST 21  ALT 15  ALKPHOS 64  BILITOT 0.5  PROT 5.7*  ALBUMIN 3.0*   Recent Labs  Lab 11/27/22 1456  LIPASE 29   No results for input(s): "AMMONIA" in the last 168 hours. Coagulation Profile: No results for input(s): "INR", "PROTIME" in the last 168 hours. Cardiac Enzymes: No results for input(s): "CKTOTAL", "CKMB", "CKMBINDEX", "TROPONINI" in the last 168 hours. BNP (last 3 results) No results for input(s): "PROBNP" in the last 8760 hours. HbA1C: No results for input(s): "HGBA1C" in the last 72 hours. CBG: Recent Labs  Lab 11/27/22 1544  GLUCAP 125*   Lipid Profile: No results for input(s): "CHOL", "HDL", "LDLCALC", "TRIG", "CHOLHDL", "LDLDIRECT" in the last 72 hours. Thyroid Function Tests: No results for input(s): "TSH", "T4TOTAL", "FREET4", "T3FREE", "THYROIDAB" in the last 72 hours. Anemia Panel: No results for input(s): "VITAMINB12", "FOLATE", "FERRITIN", "TIBC", "IRON", "RETICCTPCT" in the last 72 hours. Urine analysis:    Component Value Date/Time   COLORURINE YELLOW 08/12/2017 0627   APPEARANCEUR Clear 05/12/2022 1532   LABSPEC 1.014 08/12/2017 0627   PHURINE 6.0 08/12/2017 0627   GLUCOSEU Negative 05/12/2022 1532   HGBUR SMALL (A) 08/12/2017 0627   HGBUR trace-intact 10/28/2009 0905   BILIRUBINUR Negative 05/12/2022 1532   KETONESUR NEGATIVE 08/12/2017 0627   PROTEINUR Negative 05/12/2022 1532   PROTEINUR NEGATIVE 08/12/2017 0627   UROBILINOGEN 0.2 10/28/2009 0905   NITRITE Negative 05/12/2022 1532   NITRITE NEGATIVE 08/12/2017 0627   LEUKOCYTESUR 1+ (A) 05/12/2022 1532    Radiological Exams on Admission: DG  Chest Portable 1 View  Result Date: 11/27/2022 CLINICAL DATA:  Chest pain.  Weakness. EXAM: PORTABLE CHEST 1 VIEW  COMPARISON:  Chest radiograph 06/19/2019, 08/29/2017 FINDINGS: The heart size and mediastinal contours are within normal limits. Aortic calcifications. Mild tortuosity of the thoracic aorta. Both lungs are clear. No pleural effusion or pneumothorax. Multilevel degenerative changes in the mid and distal thoracic spine. IMPRESSION: No active disease. Electronically Signed   By: Sherron Ales M.D.   On: 11/27/2022 16:21    EKG: Independently reviewed.  Sinus rhythm, PVCs.  Mild ST depressions in lateral leads appear new compared to prior tracing.  Assessment and Plan  NSTEMI Patient with history of CAD presenting after episode of exertional dyspnea this afternoon.  She denies experiencing any chest pain at the time of my evaluation but previously told ED physician that she did have chest pain when walking.  Patient is currently asymptomatic and resting comfortably eating chips.  EKG showing new mild ST depressions in lateral leads.  Troponin 27> 49.  Cardiology consulted, appreciate recommendations.  Continue IV heparin.  Patient received full dose aspirin in the ED.  Continue cardiac monitoring and trend troponin.  Echocardiogram ordered.  Hypomagnesemia Replace magnesium and continue to monitor labs.  Chronic HFpEF BNP 260.  She has mild peripheral edema which is chronic per patient. Chest x-ray not showing pulmonary edema or pleural effusion.  Echo done in February 2019 showing EF 50 to 55%, grade 2 diastolic dysfunction, and valvular abnormalities including mild AVR, moderate MVR, and moderate TVR.  Cardiology consulted and repeat echocardiogram ordered.  Paroxysmal A-fib Stable, currently in sinus rhythm.  Continue Eliquis and metoprolol after pharmacy med rec is done.  History of breast cancer She is on tamoxifen.  Outpatient oncology follow-up.  CKD stage IIIb Renal function stable, continue to monitor.  Hypertension: Systolic in the 120s to 130s. Hyperlipidemia GERD Continue home meds  after pharmacy med rec is done.  DVT prophylaxis: IV heparin gtt Code Status: DNR (discussed with the patient) Family Communication: Niece at bedside. Level of care: Progressive Care Unit Admission status: It is my clinical opinion that referral for OBSERVATION is reasonable and necessary in this patient based on the above information provided. The aforementioned taken together are felt to place the patient at high risk for further clinical deterioration. However, it is anticipated that the patient may be medically stable for discharge from the hospital within 24 to 48 hours.   John Giovanni MD [facility]  If 7PM-7AM, please contact night-coverage [email or web address]  11/27/2022, 7:49 PM

## 2022-11-27 NOTE — ED Notes (Signed)
Got patient undressed into a gown on the monitor did EKG shown to Dr Floyd patient is resting with call bell in reach  

## 2022-11-27 NOTE — Progress Notes (Signed)
ANTICOAGULATION CONSULT NOTE - Initial Consult  Pharmacy Consult for Heparin Indication: chest pain/ACS  Allergies  Allergen Reactions   Celecoxib Swelling    Swelling-leg    Patient Measurements: Height: 5\' 1"  (154.9 cm) Weight: 72.6 kg (160 lb) IBW/kg (Calculated) : 47.8 Heparin Dosing Weight: 63.6 kg  Vital Signs: Temp: 97.9 F (36.6 C) (05/11 1929) Temp Source: Oral (05/11 1929) BP: 120/108 (05/11 1930) Pulse Rate: 72 (05/11 1930)  Labs: Recent Labs    11/27/22 1456 11/27/22 1730  HGB 10.6*  --   HCT 35.5*  --   PLT 165  --   CREATININE 1.18*  --   TROPONINIHS 27* 49*    Estimated Creatinine Clearance: 24.2 mL/min (A) (by C-G formula based on SCr of 1.18 mg/dL (H)).   Medical History: Past Medical History:  Diagnosis Date   Allergic rhinitis, seasonal    Breast cancer of upper-outer quadrant of right female breast (HCC) 09/13/2014   Chronic venous insufficiency    Dermatophytosis of nail    Dysrhythmia    H/o Atrial fibrillation   Edema leg    Family history of malignant neoplasm of breast    Family history of malignant neoplasm of ovary    Goiter    right thyroid nodule    HTN (hypertension) 08/13/2017   HX: breast cancer    bilateral   Menopausal syndrome    Osteoarthritis    Osteoporosis    Squamous cell skin cancer    right lower leg   Wears glasses    Wears hearing aid    both ears    Medications:  (Not in a hospital admission)  Scheduled:  Infusions:  PRN:   Assessment: 89 yof with a history of AF, HTN, HLD. Patient is presenting with chest pain. Heparin per pharmacy consult placed for chest pain/ACS.  Patient is on apixaban prior to arrival. Last dose 5/11 ~0900. Will require aPTT monitoring due to likely falsely high anti-Xa level secondary to DOAC use.  Hgb 10.6; plt 165  Goal of Therapy:  Heparin level 0.3-0.7 units/ml aPTT 66-102 seconds Monitor platelets by anticoagulation protocol: Yes   Plan:  No initial heparin  bolus Start heparin infusion at 750 units/hr at 9pm tonight Check aPTT & anti-Xa level at 0500 and daily while on heparin Continue to monitor via aPTT until levels are correlated Continue to monitor H&H and platelets  Delmar Landau, PharmD, BCPS 11/27/2022 7:55 PM ED Clinical Pharmacist -  [phone]

## 2022-11-27 NOTE — ED Notes (Signed)
Date and time results received: 11/27/22 1848 (use smartphrase ".now" to insert current time)  Test: Trop Critical Value: 89  Name of Provider Notified: Jeraldine Loots

## 2022-11-27 NOTE — ED Notes (Signed)
Put patient on bedpan patient is resting with family at bedside and call bell in reach

## 2022-11-27 NOTE — ED Provider Notes (Signed)
[facility] AT [facility] Provider Note   CSN: [id number] Arrival date & time: 11/27/22  1442     History  Chief Complaint  Patient presents with   Weakness    [name] is a 89 y.o. female.  HPI Patient presents from her nursing facility after episode of chest pain, which has resolved.  Patient notes that she has been compliant with all medication and was in her usual state of health until just after lunch.  While walking back to her room from lunch she felt chest pain, sternal, nonradiating.  This resolved and has not returned.  There is no associated dyspnea, syncope. Currently she denies complaints. EMS rhythm strip reviewed, notable for sinus tachycardia, rate 110, ST wave changes, wander, artifact, abnormal     Home Medications Prior to Admission medications   Medication Sig Start Date End Date Taking? Authorizing Provider  apixaban (ELIQUIS) 5 MG TABS tablet Take 1 tablet (5 mg total) by mouth 2 (two) times daily. 11/27/21   Croitoru, Mihai, MD  atorvastatin (LIPITOR) 40 MG tablet TAKE 1 TABLET BY MOUTH EVERY DAY 11/08/22   Mahlon Gammon, MD  furosemide (LASIX) 40 MG tablet TAKE 1 TABLET BY MOUTH EVERY DAY 11/08/22   Mahlon Gammon, MD  hydroxypropyl methylcellulose / hypromellose (ISOPTO TEARS / GONIOVISC) 2.5 % ophthalmic solution Place 1 drop into both eyes in the morning and at bedtime.    [provider]  ipratropium (ATROVENT) 0.06 % nasal spray PLACE 2 SPRAYS INTO BOTH NOSTRILS 4 TIMES A DAY 12/07/21   Kozlow, Alvira Philips, MD  ketoconazole (NIZORAL) 2 % cream Apply 1 application. topically 2 (two) times daily as needed for irritation.    [provider]  KLOR-CON M20 20 MEQ tablet TAKE 1 TABLET BY MOUTH EVERY DAY 11/08/22   Mahlon Gammon, MD  metoprolol succinate (TOPROL-XL) 25 MG 24 hr tablet TAKE 1 TABLET BY MOUTH EVERYDAY AT BEDTIME 11/22/22   Mahlon Gammon, MD  montelukast (SINGULAIR) 10 MG tablet TAKE 1 TABLET BY  MOUTH AT NIGHT FOR COUGH OR WHEEZING 08/16/22   Kozlow, Alvira Philips, MD  Multiple Vitamins-Minerals (PRESERVISION AREDS 2) CAPS Take 1 capsule by mouth 2 (two) times daily.     [provider]  pantoprazole (PROTONIX) 40 MG tablet TAKE 1 TABLET BY MOUTH EVERY DAY 11/01/22   Mahlon Gammon, MD  tamoxifen (NOLVADEX) 20 MG tablet TAKE 1 TABLET BY MOUTH EVERY DAY 08/05/22   Rachel Moulds, MD  tobramycin (TOBREX) 0.3 % ophthalmic solution 1 drop 4 (four) times daily. 10/04/22   [provider]      Allergies    Celecoxib    Review of Systems   Review of Systems  All other systems reviewed and are negative.   Physical Exam Updated Vital Signs BP (!) 120/108   Pulse 72   Temp 97.9 F (36.6 C) (Oral)   Resp 14   Ht 5\' 1"  (1.549 m)   Wt 72.6 kg   SpO2 99%   BMI 30.23 kg/m  Physical Exam Vitals and nursing note reviewed.  Constitutional:      General: She is not in acute distress.    Appearance: She is well-developed.  HENT:     Head: Normocephalic and atraumatic.  Eyes:     Conjunctiva/sclera: Conjunctivae normal.  Cardiovascular:     Rate and Rhythm: Normal rate and regular rhythm.  Pulmonary:     Effort: Pulmonary effort is normal. No  respiratory distress.     Breath sounds: Normal breath sounds. No stridor.  Abdominal:     General: There is no distension.  Musculoskeletal:     Right lower leg: Edema present.     Left lower leg: Edema present.  Skin:    General: Skin is warm and dry.  Neurological:     Mental Status: She is alert and oriented to person, place, and time.     Cranial Nerves: No cranial nerve deficit.  Psychiatric:        Mood and Affect: Mood normal.     ED Results / Procedures / Treatments   Labs (all labs ordered are listed, but only abnormal results are displayed) Labs Reviewed  CBC - Abnormal; Notable for the following components:      Result Value   Hemoglobin 10.6 (*)    HCT 35.5 (*)    MCHC 29.9 (*)    All other components  within normal limits  COMPREHENSIVE METABOLIC PANEL - Abnormal; Notable for the following components:   Glucose, Bld 139 (*)    BUN 44 (*)    Creatinine, Ser 1.18 (*)    Calcium 8.6 (*)    Total Protein 5.7 (*)    Albumin 3.0 (*)    GFR, Estimated 42 (*)    All other components within normal limits  MAGNESIUM - Abnormal; Notable for the following components:   Magnesium 1.4 (*)    All other components within normal limits  BRAIN NATRIURETIC PEPTIDE - Abnormal; Notable for the following components:   B Natriuretic Peptide 260.3 (*)    All other components within normal limits  CBG MONITORING, ED - Abnormal; Notable for the following components:   Glucose-Capillary 125 (*)    All other components within normal limits  TROPONIN I (HIGH SENSITIVITY) - Abnormal; Notable for the following components:   Troponin I (High Sensitivity) 27 (*)    All other components within normal limits  TROPONIN I (HIGH SENSITIVITY) - Abnormal; Notable for the following components:   Troponin I (High Sensitivity) 49 (*)    All other components within normal limits  LIPASE, BLOOD    EKG EKG Interpretation  Date/Time:  Saturday Nov 27 2022 14:56:57 EDT Ventricular Rate:  99 PR Interval:  148 QRS Duration: 109 QT Interval:  377 QTC Calculation: 484 R Axis:   -50 Text Interpretation: Sinus tachycardia Multiple premature complexes, vent & supraven LAD, consider left anterior fascicular block Borderline ST depression, lateral leads Confirmed by Gerhard Munch [phone]) on 11/27/2022 3:27:53 PM  Radiology DG Chest Portable 1 View  Result Date: 11/27/2022 CLINICAL DATA:  Chest pain.  Weakness. EXAM: PORTABLE CHEST 1 VIEW COMPARISON:  Chest radiograph 06/19/2019, 08/29/2017 FINDINGS: The heart size and mediastinal contours are within normal limits. Aortic calcifications. Mild tortuosity of the thoracic aorta. Both lungs are clear. No pleural effusion or pneumothorax. Multilevel degenerative changes in the mid  and distal thoracic spine. IMPRESSION: No active disease. Electronically Signed   By: Sherron Ales M.D.   On: 11/27/2022 16:21    Procedures Procedures    Medications Ordered in ED Medications  aspirin chewable tablet 324 mg (324 mg Oral Given 11/27/22 1542)  magnesium sulfate IVPB 1 g 100 mL (0 g Intravenous Stopped 11/27/22 1848)    ED Course/ Medical Decision Making/ A&P                             Medical Decision Making  Elderly female with multiple medical issues including CKD, CHF, CAD, A-fib on Eliquis presents after episode of chest pain that is resolved.  Given her history, risk profile, ACS is a consideration, though given the temporal proximity to lunch, GI etiology is another consideration. Patient notes baseline lower extremity edema, seemingly unchanged, and absent increased work of breathing CHF is less likely. Labs started x-ray ordered, patient placed on continuous cardiac monitoring. Cardiac 110 sinus tach abnormal Pulse ox 100% room air normal   Amount and/or Complexity of Data Reviewed Independent Historian: EMS    Details: EMS rhythm strip above External Data Reviewed: notes.    Details: Notes reviewed Labs: ordered. Decision-making details documented in ED Course. Radiology: ordered and independent interpretation performed. Decision-making details documented in ED Course. ECG/medicine tests: ordered and independent interpretation performed. Decision-making details documented in ED Course.  Risk OTC drugs. Prescription drug management.   7:38 PM Still no pain, but the patient's labs are notable for near doubling of her initial troponin value.  Patient's other labs are reassuring, tach from mild hypomagnesemia. With elevated BNP, this may be contributory to her brief shortness of breath and chest pain during her ambulation following lunch, but with elevated troponin, patient will be admitted for further monitoring, management of unstable angina.  Cardiology  consulted, will follow, commended his to start heparin.  Patient has had no recurrence of pain.  Results discussed, shared with patient and her niece prior to admission.        Final Clinical Impression(s) / ED Diagnoses Final diagnoses:  Exertional chest pain  Elevated troponin   CRITICAL CARE Performed by: Gerhard Munch Total critical care time: 35 minutes Critical care time was exclusive of separately billable procedures and treating other patients. Critical care was necessary to treat or prevent imminent or life-threatening deterioration. Critical care was time spent personally by me on the following activities: development of treatment plan with patient and/or surrogate as well as nursing, discussions with consultants, evaluation of patient's response to treatment, examination of patient, obtaining history from patient or surrogate, ordering and performing treatments and interventions, ordering and review of laboratory studies, ordering and review of radiographic studies, pulse oximetry and re-evaluation of patient's condition.    Gerhard Munch, MD 11/27/22 [phone]

## 2022-11-27 NOTE — Consult Note (Signed)
Cardiology Consultation   Patient ID: [name] MRN: [id number]; DOB: 1922/05/12  Admit date: 11/27/2022 Date of Consult: 11/27/2022  PCP:  Mahlon Gammon, MD   [facility] Providers Cardiologist:  Thurmon Fair, MD  Cardiology APP:  Marcelino Duster, [place]       Patient Profile:   [name] is a 89 y.o. female with a hx of paroxysmal atrial fibrillation, complicated by non-STEMI and transient congestive heart failure in the setting of cholecystectomy in January 2019, hypertension, hyperlipidemia who is being seen 11/27/2022 for the evaluation of chest pain at the request of Dr. Loney Loh.  History of Present Illness:  [name] is a 89 y.o. female with a hx of paroxysmal atrial fibrillation, complicated by non-STEMI and transient congestive heart failure in the setting of cholecystectomy in January 2019, hypertension, hyperlipidemia who is being seen 11/27/2022 for the evaluation of chest pain   Patient was at nursing facility reported chest pain and fatigue while walking after lunch, chest pain resolved shortly after but because she was feeling weak and tired and history of chest pain she was brought to the hospital for further evaluation. Denies any other associated symptoms.  Currently chest pain-free ER evaluation shows blood pressure 139/79, initially tachycardic but currently heart rate 72 bpm, satting 90% on room air. Troponin 27-49. EKG shows normal sinus rhythm with PAC and PVCs, nonspecific ST-T wave changes. Creatinine 1.18, baseline CKD stage IIIb, baseline creatinine 1.2, NT proBNP 260.  Chest x-ray appears clear  Very sweet elderly lady reports something weird in her chest could not explain what it is and may be some tightness but quickly resolved without needing medication and her husband was very worried and called ambulance.  She reports her legs are always swollen takes Lasix but denies orthopnea or PND, walks with a walker at facility.  She is  very cheerful and sweet older lady.  Past Medical History:  Diagnosis Date   Allergic rhinitis, seasonal    Breast cancer of upper-outer quadrant of right female breast (HCC) 09/13/2014   Chronic venous insufficiency    Dermatophytosis of nail    Dysrhythmia    H/o Atrial fibrillation   Edema leg    Family history of malignant neoplasm of breast    Family history of malignant neoplasm of ovary    Goiter    right thyroid nodule    HTN (hypertension) 08/13/2017   HX: breast cancer    bilateral   Menopausal syndrome    Osteoarthritis    Osteoporosis    Squamous cell skin cancer    right lower leg   Wears glasses    Wears hearing aid    both ears    Past Surgical History:  Procedure Laterality Date   bilateral lumpectomies for breast cancer  Aug. 2007   BREAST LUMPECTOMY WITH RADIOACTIVE SEED LOCALIZATION Right 10/01/2014   Procedure: BREAST LUMPECTOMY WITH RADIOACTIVE SEED LOCALIZATION;  Surgeon: Harriette Bouillon, MD;  Location: [facility];  Service: General;  Laterality: Right;   BREAST LUMPECTOMY WITH RADIOACTIVE SEED LOCALIZATION Left 03/22/2019   Procedure: LEFT BREAST LUMPECTOMY WITH RADIOACTIVE SEED LOCALIZATION;  Surgeon: Harriette Bouillon, MD;  [facility]: [facility];  Service: General;  Laterality: Left;   BREAST LUMPECTOMY WITH RADIOACTIVE SEED LOCALIZATION Right 08/18/2021   Procedure: RIGHT BREAST LUMPECTOMY WITH RADIOACTIVE SEED LOCALIZATION;  Surgeon: Harriette Bouillon, MD;  Location: [facility];  Service: General;  Laterality: Right;   CATARACT EXTRACTION     CHOLECYSTECTOMY  N/A 08/13/2017   Procedure: LAPAROSCOPIC CHOLECYSTECTOMY WITH INTRAOPERATIVE CHOLANGIOGRAM;  Surgeon: Glenna Fellows, MD;  Location: [facility];  Service: General;  Laterality: N/A;   DILATION AND CURETTAGE OF UTERUS     ESOPHAGOGASTRODUODENOSCOPY  07/26/2011   Procedure: ESOPHAGOGASTRODUODENOSCOPY (EGD);  Surgeon: Petra Kuba, MD;  Location: [facility];  Service: Endoscopy;  Laterality: N/A;    history of lumbar compression fracture     left eardum sx  1983   left knee arthroscopic surgery     no screening colonoscopy     s/p bilateral lumpectomies     SAVORY DILATION  07/26/2011   Procedure: SAVORY DILATION;  Surgeon: Petra Kuba, MD;  Location: [facility];  Service: Endoscopy;  Laterality: N/A;   status post resection squamous cell cancer right lower leg     TONSILLECTOMY     UMBILICAL HERNIA REPAIR       Home Medications:  Prior to Admission medications   Medication Sig Start Date End Date Taking? Authorizing Provider  apixaban (ELIQUIS) 5 MG TABS tablet Take 1 tablet (5 mg total) by mouth 2 (two) times daily. 11/27/21   Croitoru, Mihai, MD  atorvastatin (LIPITOR) 40 MG tablet TAKE 1 TABLET BY MOUTH EVERY DAY 11/08/22   Mahlon Gammon, MD  furosemide (LASIX) 40 MG tablet TAKE 1 TABLET BY MOUTH EVERY DAY 11/08/22   Mahlon Gammon, MD  hydroxypropyl methylcellulose / hypromellose (ISOPTO TEARS / GONIOVISC) 2.5 % ophthalmic solution Place 1 drop into both eyes in the morning and at bedtime.    [provider]  ipratropium (ATROVENT) 0.06 % nasal spray PLACE 2 SPRAYS INTO BOTH NOSTRILS 4 TIMES A DAY 12/07/21   Kozlow, Alvira Philips, MD  ketoconazole (NIZORAL) 2 % cream Apply 1 application. topically 2 (two) times daily as needed for irritation.    [provider]  KLOR-CON M20 20 MEQ tablet TAKE 1 TABLET BY MOUTH EVERY DAY 11/08/22   Mahlon Gammon, MD  metoprolol succinate (TOPROL-XL) 25 MG 24 hr tablet TAKE 1 TABLET BY MOUTH EVERYDAY AT BEDTIME 11/22/22   Mahlon Gammon, MD  montelukast (SINGULAIR) 10 MG tablet TAKE 1 TABLET BY MOUTH AT NIGHT FOR COUGH OR WHEEZING 08/16/22   Kozlow, Alvira Philips, MD  Multiple Vitamins-Minerals (PRESERVISION AREDS 2) CAPS Take 1 capsule by mouth 2 (two) times daily.     [provider]  pantoprazole (PROTONIX) 40 MG tablet TAKE 1 TABLET BY MOUTH EVERY DAY 11/01/22   Mahlon Gammon, MD  tamoxifen (NOLVADEX) 20 MG tablet TAKE 1 TABLET BY  MOUTH EVERY DAY 08/05/22   Rachel Moulds, MD  tobramycin (TOBREX) 0.3 % ophthalmic solution 1 drop 4 (four) times daily. 10/04/22   [provider]    Inpatient Medications: Scheduled Meds:  Continuous Infusions:  PRN Meds:   Allergies:    Allergies  Allergen Reactions   Celecoxib Swelling    Swelling-leg    Social History:   Social History   Socioeconomic History   Marital status: Married    Spouse name: DECLYNN STANDEFER   Number of children: Not on file   Years of education: Not on file   Highest education level: Not on file  Occupational History   Not on file  Tobacco Use   Smoking status: Never   Smokeless tobacco: Never  Vaping Use   Vaping Use: Never used  Substance and Sexual Activity   Alcohol use: No   Drug use: No   Sexual activity: Not Currently  Other Topics Concern   Not on file  Social History Narrative   Pt and her husband are Active members of [employer] and have support from this community. No Children.    Social Determinants of Health   Financial Resource Strain: Low Risk  (10/12/2021)   Overall Financial Resource Strain (CARDIA)    Difficulty of Paying Living Expenses: Not hard at all  Food Insecurity: No Food Insecurity (10/12/2021)   Hunger Vital Sign    Worried About Running Out of Food in the Last Year: Never true    Ran Out of Food in the Last Year: Never true  Transportation Needs: No Transportation Needs (10/12/2021)   PRAPARE - Administrator, Civil Service (Medical): No    Lack of Transportation (Non-Medical): No  Physical Activity: Insufficiently Active (10/12/2021)   Exercise Vital Sign    Days of Exercise per Week: 7 days    Minutes of Exercise per Session: 20 min  Stress: No Stress Concern Present (10/12/2021)   [name] of Occupational Health - Occupational Stress Questionnaire    Feeling of Stress : Only a little  Social Connections: Socially Integrated (10/12/2021)   Social Connection  and Isolation Panel [NHANES]    Frequency of Communication with Friends and Family: More than three times a week    Frequency of Social Gatherings with Friends and Family: More than three times a week    Attends Religious Services: More than 4 times per year    Active Member of [employer] or Organizations: Yes    Attends Banker Meetings: Never    Marital Status: Married  Catering manager Violence: Not At Risk (10/12/2021)   Humiliation, Afraid, Rape, and Kick questionnaire    Fear of Current or Ex-Partner: No    Emotionally Abused: No    Physically Abused: No    Sexually Abused: No    Family History:    Family History  Problem Relation Age of Onset   Cancer Mother 57       breast   Heart attack Father    Heart attack Brother    Heart attack Brother    Cancer Maternal Aunt 55       breast   Coronary artery disease Other    Cancer Other        ovarian; daughter of mat aunt w/ BC in 22s   Heart disease Brother    Cancer Maternal Aunt 60       breast     ROS:  Please see the history of present illness.   All other ROS reviewed and negative.     Physical Exam/Data:   Vitals:   11/27/22 1815 11/27/22 1900 11/27/22 1929 11/27/22 1930  BP: (!) 121/46 (!) 130/47  (!) 120/108  Pulse: 75 76  72  Resp: 20 (!) 21  14  Temp:   97.9 F (36.6 C)   TempSrc:   Oral   SpO2: 96% 100%  99%  Weight:      Height:        Intake/Output Summary (Last 24 hours) at 11/27/2022 1951 Last data filed at 11/27/2022 1848 Gross per 24 hour  Intake 92.8 ml  Output --  Net 92.8 ml      11/27/2022    2:53 PM 11/17/2022   10:05 AM 08/13/2022    2:33 PM  Last 3 Weights  Weight (lbs) 160 lb 161 lb 12.8 oz 165 lb 6.4 oz  Weight (kg) 72.576 kg 73.392  kg 75.025 kg     Body mass index is 30.23 kg/m.  General:  Well nourished, well developed, in no acute distress HEENT: normal Neck: no JVD Vascular: No carotid bruits; Distal pulses 2+ bilaterally Cardiac:  normal S1, S2; RRR; no murmur   Lungs:  clear to auscultation bilaterally, no wheezing, rhonchi or rales  Abd: soft, nontender, no hepatomegaly  Ext: no edema Musculoskeletal:  No deformities, BUE and BLE strength normal and equal Skin: warm and dry  Neuro:  CNs 2-12 intact, no focal abnormalities noted Psych:  Normal affect      Relevant CV Studies: Echo 2019  - Left ventricle: The cavity size was normal. Wall thickness was    increased in a pattern of mild LVH. Systolic function was normal.    The estimated ejection fraction was in the range of 50% to 55%.    There is akinesis of the apical myocardium. Features are    consistent with a pseudonormal left ventricular filling pattern,    with concomitant abnormal relaxation and increased filling    pressure (grade 2 diastolic dysfunction).  - Aortic valve: Trileaflet; mildly thickened, mildly calcified    leaflets. There was mild regurgitation.  - Mitral valve: Severely calcified annulus. Mildly thickened,    mildly calcified leaflets . There was moderate regurgitation.  - Left atrium: The atrium was mildly to moderately dilated.    Volume/bsa, S: 43.1 ml/m^2.  - Tricuspid valve: There was moderate regurgitation.  - Pulmonary arteries: Systolic pressure was moderately increased.   Laboratory Data:  High Sensitivity Troponin:   Recent Labs  Lab 11/27/22 1456 11/27/22 1730  TROPONINIHS 27* 49*     Chemistry Recent Labs  Lab 11/27/22 1456  NA 141  K 4.0  CL 109  CO2 22  GLUCOSE 139*  BUN 44*  CREATININE 1.18*  CALCIUM 8.6*  MG 1.4*  GFRNONAA 42*  ANIONGAP 10    Recent Labs  Lab 11/27/22 1456  PROT 5.7*  ALBUMIN 3.0*  AST 21  ALT 15  ALKPHOS 64  BILITOT 0.5   Lipids No results for input(s): "CHOL", "TRIG", "HDL", "LABVLDL", "LDLCALC", "CHOLHDL" in the last 168 hours.  Hematology Recent Labs  Lab 11/27/22 1456  WBC 6.7  RBC 3.93  HGB 10.6*  HCT 35.5*  MCV 90.3  MCH 27.0  MCHC 29.9*  RDW 14.8  PLT 165   Thyroid No results  for input(s): "TSH", "FREET4" in the last 168 hours.  BNP Recent Labs  Lab 11/27/22 1456  BNP 260.3*    DDimer No results for input(s): "DDIMER" in the last 168 hours.   Radiology/Studies:  DG Chest Portable 1 View  Result Date: 11/27/2022 CLINICAL DATA:  Chest pain.  Weakness. EXAM: PORTABLE CHEST 1 VIEW COMPARISON:  Chest radiograph 06/19/2019, 08/29/2017 FINDINGS: The heart size and mediastinal contours are within normal limits. Aortic calcifications. Mild tortuosity of the thoracic aorta. Both lungs are clear. No pleural effusion or pneumothorax. Multilevel degenerative changes in the mid and distal thoracic spine. IMPRESSION: No active disease. Electronically Signed   By: Sherron Ales M.D.   On: 11/27/2022 16:21     Assessment and Plan:   Chest pain and elevated troponin 27->49 Prior h/o NSTEMI and presumed CAD- medically managed Advanced age (48) P.afib on eliquis HLD, HTN   Plan: - admitted to hospitalist service.  Very sweet old lady currently chest pain-free, reports weird sensation in her chest cannot really explain what it is may be some fluttering may be some  chest tightness, EKG nonspecific, troponin mildly up. -Continue to trend troponin, EKG.  Most likely will conservatively manage her given her age and comorbidities. -Continue heparin GTT for 48 hours, obtain echocardiogram in the a.m. -Continue aspirin, statin, Toprol-XL 25 mg daily.  At home she is on Lasix 40 mg daily.  Currently appears euvolemic.  Can continue the same -Hold home Eliquis   Will follow along DNR   Risk Assessment/Risk Scores:     TIMI Risk Score for Unstable Angina or Non-ST Elevation MI:   The patient's TIMI risk score is  , which indicates a  % risk of all cause mortality, new or recurrent myocardial infarction or need for urgent revascularization in the next 14 days.    CHA2DS2-VASc Score =     This indicates a  % annual risk of stroke. The patient's score is based upon:           For questions or updates, please contact [facility] Please consult [email or web address] for contact info under    Signed, Elmon Kirschner, MD  11/27/2022 7:51 PM

## 2022-11-27 NOTE — ED Notes (Signed)
ED TO INPATIENT HANDOFF REPORT  ED Nurse Name and Phone #: [phone] S Name/Age/Gender [name] 89 y.o. female Room/Bed: 033C/033C  Code Status   Code Status: Prior  Home/SNF/Other Home Patient oriented to: self, place, time, and situation Is this baseline? Yes   Triage Complete: Triage complete  Chief Complaint Chest pain [R07.9]  Triage Note Pt to er via ems, per ems pt started feeling weak after having lunch today.  Pt states that she is here because the people at the facility said she had to come to the er for a "little heart thing"  pt denies chest pain.     Allergies Allergies  Allergen Reactions   Celecoxib Swelling    Swelling-leg    Level of Care/Admitting Diagnosis ED Disposition     ED Disposition  Admit   Condition  --   Comment  Hospital Area: [facility] [[id number]]  Level of Care: Progressive [102]  Admit to Progressive based on following criteria: CARDIOVASCULAR & THORACIC of moderate stability with acute coronary syndrome symptoms/low risk myocardial infarction/hypertensive urgency/arrhythmias/heart failure potentially compromising stability and stable post cardiovascular intervention patients.  May place patient in observation at [facility] or [facility] if equivalent level of care is available:: Yes  Covid Evaluation: Asymptomatic - no recent exposure (last 10 days) testing not required  Diagnosis: Chest pain [657846]  Admitting Physician: John Giovanni [[id number]]  Attending Physician: John Giovanni [[id number]]          B Medical/Surgery History Past Medical History:  Diagnosis Date   Allergic rhinitis, seasonal    Breast cancer of upper-outer quadrant of right female breast (HCC) 09/13/2014   Chronic venous insufficiency    Dermatophytosis of nail    Dysrhythmia    H/o Atrial fibrillation   Edema leg    Family history of malignant neoplasm of breast    Family history of malignant neoplasm of ovary    Goiter     right thyroid nodule    HTN (hypertension) 08/13/2017   HX: breast cancer    bilateral   Menopausal syndrome    Osteoarthritis    Osteoporosis    Squamous cell skin cancer    right lower leg   Wears glasses    Wears hearing aid    both ears   Past Surgical History:  Procedure Laterality Date   bilateral lumpectomies for breast cancer  Aug. 2007   BREAST LUMPECTOMY WITH RADIOACTIVE SEED LOCALIZATION Right 10/01/2014   Procedure: BREAST LUMPECTOMY WITH RADIOACTIVE SEED LOCALIZATION;  Surgeon: Harriette Bouillon, MD;  [facility];  Service: General;  Laterality: Right;   BREAST LUMPECTOMY WITH RADIOACTIVE SEED LOCALIZATION Left 03/22/2019   Procedure: LEFT BREAST LUMPECTOMY WITH RADIOACTIVE SEED LOCALIZATION;  Surgeon: Harriette Bouillon, MD;  Location: [facility];  Service: General;  Laterality: Left;   BREAST LUMPECTOMY WITH RADIOACTIVE SEED LOCALIZATION Right 08/18/2021   Procedure: RIGHT BREAST LUMPECTOMY WITH RADIOACTIVE SEED LOCALIZATION;  Surgeon: Harriette Bouillon, MD;  Location: [facility];  Service: General;  Laterality: Right;   CATARACT EXTRACTION     CHOLECYSTECTOMY N/A 08/13/2017   Procedure: LAPAROSCOPIC CHOLECYSTECTOMY WITH INTRAOPERATIVE CHOLANGIOGRAM;  Surgeon: Glenna Fellows, MD;  Location: [facility];  Service: General;  Laterality: N/A;   DILATION AND CURETTAGE OF UTERUS     ESOPHAGOGASTRODUODENOSCOPY  07/26/2011   Procedure: ESOPHAGOGASTRODUODENOSCOPY (EGD);  Surgeon: Petra Kuba, MD;  Location: [facility];  Service: Endoscopy;  Laterality: N/A;   history of lumbar compression fracture  left eardum sx  1983   left knee arthroscopic surgery     no screening colonoscopy     s/p bilateral lumpectomies     SAVORY DILATION  07/26/2011   Procedure: SAVORY DILATION;  Surgeon: Petra Kuba, MD;  Location: [facility];  Service: Endoscopy;  Laterality: N/A;   status post resection squamous cell cancer right lower leg     TONSILLECTOMY     UMBILICAL HERNIA REPAIR        A IV Location/Drains/Wounds Patient Lines/Drains/Airways Status     Active Line/Drains/Airways     Name Placement date Placement time Site Days   Peripheral IV 11/27/22 20 G Left Antecubital 11/27/22  1630  Antecubital  less than 1   Incision (Closed) 03/22/19 Breast Left 03/22/19  0817  -- 1346   Incision (Closed) 08/18/21 Breast Right 08/18/21  1633  -- 466            Intake/Output Last 24 hours  Intake/Output Summary (Last 24 hours) at 11/27/2022 2007 Last data filed at 11/27/2022 1610 Gross per 24 hour  Intake 92.8 ml  Output --  Net 92.8 ml    Labs/Imaging Results for orders placed or performed during the hospital encounter of 11/27/22 (from the past 48 hour(s))  Troponin I (High Sensitivity)     Status: Abnormal   Collection Time: 11/27/22  2:56 PM  Result Value Ref Range   Troponin I (High Sensitivity) 27 (H) <18 ng/L    Comment: (NOTE) Elevated high sensitivity troponin I (hsTnI) values and significant  changes across serial measurements may suggest ACS but many other  chronic and acute conditions are known to elevate hsTnI results.  Refer to the "Links" section for chest pain algorithms and additional  guidance. Performed at [facility], [address], [place], [place] [address]   CBC     Status: Abnormal   Collection Time: 11/27/22  2:56 PM  Result Value Ref Range   WBC 6.7 4.0 - 10.5 K/uL   RBC 3.93 3.87 - 5.11 MIL/uL   Hemoglobin 10.6 (L) 12.0 - 15.0 g/dL   HCT 40.9 (L) 81.1 - 91.4 %   MCV 90.3 80.0 - 100.0 fL   MCH 27.0 26.0 - 34.0 pg   MCHC 29.9 (L) 30.0 - 36.0 g/dL   RDW 78.2 95.6 - 21.3 %   Platelets 165 150 - 400 K/uL   nRBC 0.0 0.0 - 0.2 %    Comment: Performed at [facility], [address], [place], [place] [address]  Comprehensive metabolic panel     Status: Abnormal   Collection Time: 11/27/22  2:56 PM  Result Value Ref Range   Sodium 141 135 - 145 mmol/L   Potassium 4.0 3.5 - 5.1 mmol/L   Chloride 109 98 - 111  mmol/L   CO2 22 22 - 32 mmol/L   Glucose, Bld 139 (H) 70 - 99 mg/dL    Comment: Glucose reference range applies only to samples taken after fasting for at least 8 hours.   BUN 44 (H) 8 - 23 mg/dL   Creatinine, Ser 8.46 (H) 0.44 - 1.00 mg/dL   Calcium 8.6 (L) 8.9 - 10.3 mg/dL   Total Protein 5.7 (L) 6.5 - 8.1 g/dL   Albumin 3.0 (L) 3.5 - 5.0 g/dL   AST 21 15 - 41 U/L   ALT 15 0 - 44 U/L   Alkaline Phosphatase 64 38 - 126 U/L   Total Bilirubin 0.5 0.3 -  1.2 mg/dL   GFR, Estimated 42 (L) >60 mL/min    Comment: (NOTE) Calculated using the CKD-EPI Creatinine Equation (2021)    Anion gap 10 5 - 15    Comment: Performed at [facility], [address] [place], [place] [address]  Magnesium     Status: Abnormal   Collection Time: 11/27/22  2:56 PM  Result Value Ref Range   Magnesium 1.4 (L) 1.7 - 2.4 mg/dL    Comment: Performed at [facility], [address], [place], [place] [address]  Lipase, blood     Status: None   Collection Time: 11/27/22  2:56 PM  Result Value Ref Range   Lipase 29 11 - 51 U/L    Comment: Performed at [facility], [address] [place], [place] [address]  Brain natriuretic peptide     Status: Abnormal   Collection Time: 11/27/22  2:56 PM  Result Value Ref Range   B Natriuretic Peptide 260.3 (H) 0.0 - 100.0 pg/mL    Comment: Performed at [facility], [address], [place], [place] [address]  CBG monitoring, ED     Status: Abnormal   Collection Time: 11/27/22  3:44 PM  Result Value Ref Range   Glucose-Capillary 125 (H) 70 - 99 mg/dL    Comment: Glucose reference range applies only to samples taken after fasting for at least 8 hours.  Troponin I (High Sensitivity)     Status: Abnormal   Collection Time: 11/27/22  5:30 PM  Result Value Ref Range   Troponin I (High Sensitivity) 49 (H) <18 ng/L    Comment: RESULT CALLED TO, READ BACK BY AND VERIFIED WITH T,DEVOLT RN @1841  11/27/22 E,[place] DELTA CHECK NOTED (NOTE) Elevated high  sensitivity troponin I (hsTnI) values and significant  changes across serial measurements may suggest ACS but many other  chronic and acute conditions are known to elevate hsTnI results.  Refer to the "Links" section for chest pain algorithms and additional  guidance. Performed at [facility], [address], [place], [place] [address]    DG Chest Portable 1 View  Result Date: 11/27/2022 CLINICAL DATA:  Chest pain.  Weakness. EXAM: PORTABLE CHEST 1 VIEW COMPARISON:  Chest radiograph 06/19/2019, 08/29/2017 FINDINGS: The heart size and mediastinal contours are within normal limits. Aortic calcifications. Mild tortuosity of the thoracic aorta. Both lungs are clear. No pleural effusion or pneumothorax. Multilevel degenerative changes in the mid and distal thoracic spine. IMPRESSION: No active disease. Electronically Signed   By: Sherron Ales M.D.   On: 11/27/2022 16:21    Pending Labs Unresulted Labs (From admission, onward)     Start     Ordered   11/29/22 0500  APTT  Daily,   R      11/27/22 2006   11/29/22 0500  Heparin level (unfractionated)  Daily,   R      11/27/22 2006   11/28/22 0500  APTT  Once-Timed,   TIMED        11/27/22 2006   11/28/22 0500  Heparin level (unfractionated)  Once-Timed,   TIMED        11/27/22 2006   11/27/22 2006  APTT  ONCE - STAT,   STAT        11/27/22 2006   11/27/22 2006  Heparin level (unfractionated)  ONCE - STAT,   STAT        11/27/22 2006  Vitals/Pain Today's Vitals   11/27/22 1834 11/27/22 1900 11/27/22 1929 11/27/22 1930  BP:  (!) 130/47  (!) 120/108  Pulse:  76  72  Resp:  (!) 21  14  Temp:   97.9 F (36.6 C)   TempSrc:   Oral   SpO2:  100%  99%  Weight:      Height:      PainSc: Asleep       Isolation Precautions No active isolations  Medications Medications  heparin ADULT infusion 100 units/mL (25000 units/255mL) (has no administration in time range)  aspirin chewable tablet 324 mg (324 mg Oral Given  11/27/22 1542)  magnesium sulfate IVPB 1 g 100 mL (0 g Intravenous Stopped 11/27/22 1848)    Mobility power wheelchair     Focused Assessments Cardiac Assessment Handoff:    Lab Results  Component Value Date   TROPONINI 4.21 (HH) 08/26/2017   Lab Results  Component Value Date   DDIMER 0.79 (H) 06/27/2019   Does the Patient currently have chest pain? No    R Recommendations: See Admitting Provider Note  Report given to:   Additional Notes:

## 2022-11-27 NOTE — ED Triage Notes (Signed)
Pt to er via ems, per ems pt started feeling weak after having lunch today.  Pt states that she is here because the people at the facility said she had to come to the er for a "little heart thing"  pt denies chest pain.

## 2022-11-27 NOTE — ED Notes (Signed)
Pt on and off bed pain, family at bedside.

## 2022-11-28 ENCOUNTER — Observation Stay (HOSPITAL_BASED_OUTPATIENT_CLINIC_OR_DEPARTMENT_OTHER): Payer: MEDICARE

## 2022-11-28 ENCOUNTER — Telehealth: Payer: Self-pay | Admitting: Cardiology

## 2022-11-28 DIAGNOSIS — R079 Chest pain, unspecified: Secondary | ICD-10-CM

## 2022-11-28 DIAGNOSIS — I214 Non-ST elevation (NSTEMI) myocardial infarction: Secondary | ICD-10-CM | POA: Diagnosis not present

## 2022-11-28 LAB — ECHOCARDIOGRAM COMPLETE
AR max vel: 1.2 cm2
AV Area VTI: 1.35 cm2
AV Area mean vel: 1.33 cm2
AV Mean grad: 6 mmHg
AV Peak grad: 10.5 mmHg
AV Vena cont: 0.3 cm
Ao pk vel: 1.62 m/s
Area-P 1/2: 3.23 cm2
Height: 61 in
MV VTI: 1.26 cm2
P 1/2 time: 344 msec
S' Lateral: 2.6 cm
Weight: 2726.65 oz

## 2022-11-28 LAB — MAGNESIUM: Magnesium: 2.1 mg/dL (ref 1.7–2.4)

## 2022-11-28 LAB — BASIC METABOLIC PANEL
Anion gap: 10 (ref 5–15)
BUN: 34 mg/dL — ABNORMAL HIGH (ref 8–23)
CO2: 22 mmol/L (ref 22–32)
Calcium: 8.1 mg/dL — ABNORMAL LOW (ref 8.9–10.3)
Chloride: 110 mmol/L (ref 98–111)
Creatinine, Ser: 1.04 mg/dL — ABNORMAL HIGH (ref 0.44–1.00)
GFR, Estimated: 49 mL/min — ABNORMAL LOW (ref >60)
Glucose, Bld: 123 mg/dL — ABNORMAL HIGH (ref 70–99)
Potassium: 3.4 mmol/L — ABNORMAL LOW (ref 3.5–5.1)
Sodium: 142 mmol/L (ref 135–145)

## 2022-11-28 LAB — CBC
HCT: 32 % — ABNORMAL LOW (ref 36.0–46.0)
Hemoglobin: 10.1 g/dL — ABNORMAL LOW (ref 12.0–15.0)
MCH: 27.2 pg (ref 26.0–34.0)
MCHC: 31.6 g/dL (ref 30.0–36.0)
MCV: 86.3 fL (ref 80.0–100.0)
Platelets: 131 10*3/uL — ABNORMAL LOW (ref 150–400)
RBC: 3.71 MIL/uL — ABNORMAL LOW (ref 3.87–5.11)
RDW: 14.9 % (ref 11.5–15.5)
WBC: 4.3 10*3/uL (ref 4.0–10.5)
nRBC: 0 % (ref 0.0–0.2)

## 2022-11-28 LAB — APTT: aPTT: 84 seconds — ABNORMAL HIGH (ref 24–36)

## 2022-11-28 LAB — HEPARIN LEVEL (UNFRACTIONATED): Heparin Unfractionated: >1.1 IU/mL — ABNORMAL HIGH (ref 0.30–0.70)

## 2022-11-28 MED ORDER — ASPIRIN 81 MG PO TBEC
81.0000 mg | DELAYED_RELEASE_TABLET | Freq: Every day | ORAL | Status: DC
  Administered 2022-11-28: 81 mg via ORAL
  Filled 2022-11-28: qty 1

## 2022-11-28 MED ORDER — APIXABAN 5 MG PO TABS: 5.0000 mg | ORAL_TABLET | Freq: Two times a day (BID) | ORAL | Status: DC

## 2022-11-28 MED ORDER — HYPROMELLOSE (GONIOSCOPIC) 2.5 % OP SOLN
1.0000 [drp] | Freq: Two times a day (BID) | OPHTHALMIC | Status: DC
  Filled 2022-11-28: qty 15

## 2022-11-28 MED ORDER — POTASSIUM CHLORIDE CRYS ER 20 MEQ PO TBCR
40.0000 meq | EXTENDED_RELEASE_TABLET | Freq: Once | ORAL | Status: AC
  Administered 2022-11-28: 40 meq via ORAL
  Filled 2022-11-28: qty 2

## 2022-11-28 MED ORDER — PANTOPRAZOLE SODIUM 40 MG PO TBEC
40.0000 mg | DELAYED_RELEASE_TABLET | Freq: Every day | ORAL | Status: DC
  Administered 2022-11-28: 40 mg via ORAL
  Filled 2022-11-28: qty 1

## 2022-11-28 MED ORDER — APIXABAN 5 MG PO TABS
5.0000 mg | ORAL_TABLET | Freq: Two times a day (BID) | ORAL | Status: DC
  Administered 2022-11-28: 5 mg via ORAL
  Filled 2022-11-28: qty 1

## 2022-11-28 MED ORDER — METOPROLOL SUCCINATE ER 25 MG PO TB24
25.0000 mg | ORAL_TABLET | Freq: Every day | ORAL | Status: DC
  Administered 2022-11-28: 25 mg via ORAL
  Filled 2022-11-28: qty 1

## 2022-11-28 MED ORDER — ATORVASTATIN CALCIUM 40 MG PO TABS
40.0000 mg | ORAL_TABLET | Freq: Every day | ORAL | Status: DC
  Administered 2022-11-28: 40 mg via ORAL
  Filled 2022-11-28: qty 1

## 2022-11-28 NOTE — Social Work (Signed)
Pt confirmed that she is from [facility] IDL and family in room will transport her home.

## 2022-11-28 NOTE — Progress Notes (Addendum)
[facility] PROGRESS NOTE   [name] XBJ:[id number] DOB: 10-10-1921 DOA: 11/27/2022  PCP: Taylor Gammon, MD  Brief History/Interval Summary:  89 y.o. female with medical history significant of CAD, chronic HFpEF, hyperlipidemia, paroxysmal A-fib on Eliquis, hypertension, GERD, history of breast cancer, CKD stage IIIb presented to ED with chest pain.  Mildly elevated troponin levels noted.  Patient was seen by cardiology.  She was hospitalized for further management.  Consultants: Cardiology  Procedures: Echocardiogram is pending    Subjective/Interval History: Patient denies any chest pain this morning.  No shortness of breath.    Assessment/Plan:  Chest pain Mildly elevated troponin levels noted.  Etiology for symptoms not clear.  Currently chest pain-free.  Cardiology has seen the patient.  Patient currently on IV heparin.  Continue aspirin. Echocardiogram is pending. Await further cardiology input.  Hypomagnesemia/hypokalemia Supplemented.  Chronic diastolic CHF Mild peripheral edema was noted which is chronic.  Echocardiogram is pending.  Paroxysmal atrial fibrillation Continue metoprolol.  Was on apixaban prior to admission.  Currently on IV heparin.   Mildly elevated TSH noted.  Could be sick euthyroid.  Will check free T4 in the morning.  History of breast cancer Continue tamoxifen.  Chronic kidney disease stage IIIb Stable.  Essential hypertension Continue beta-blocker.  Monitor blood pressures closely.  Normocytic anemia Hemoglobin is stable.  Outpatient workup.  Obesity Estimated body mass index is 32.2 kg/m as calculated from the following:   Height as of this encounter: 5\' 1"  (1.549 m).   Weight as of this encounter: 77.3 kg.   DVT Prophylaxis: On IV heparin Code Status: DNR Family Communication: No family at bedside Disposition Plan: Anticipate return home when improved.  Start mobilizing.  Status is: Observation The patient  will require care spanning > 2 midnights and should be moved to inpatient because: Chest pain      Medications: Scheduled: Continuous:  heparin 750 Units/hr (11/27/22 2019)   HYQ:[id number] **OR** acetaminophen  Antibiotics: Anti-infectives (From admission, onward)    None       Objective:  Vital Signs  Vitals:   11/27/22 2200 11/28/22 0015 11/28/22 0441 11/28/22 0836  BP:  (!) 126/50 (!) 113/39 (!) 138/52  Pulse: 81 69 68   Resp:  20  18  Temp:    97.8 F (36.6 C)  TempSrc:    Oral  SpO2: 96% 96% 95%   Weight:      Height:        Intake/Output Summary (Last 24 hours) at 11/28/2022 1001 Last data filed at 11/28/2022 4132 Gross per 24 hour  Intake 92.8 ml  Output 500 ml  Net -407.2 ml   Filed Weights   11/27/22 1453 11/27/22 2106  Weight: 72.6 kg 77.3 kg    General appearance: Awake alert.  In no distress Resp: Clear to auscultation bilaterally.  Normal effort Cardio: S1-S2 is normal regular.  No S3-S4.  No rubs murmurs or bruit GI: Abdomen is soft.  Nontender nondistended.  Bowel sounds are present normal.  No masses organomegaly Extremities: No edema.  Full range of motion of lower extremities. Neurologic:  No focal neurological deficits.    Lab Results:  Data Reviewed: I have personally reviewed following labs and reports of the imaging studies  CBC: Recent Labs  Lab 11/27/22 1456 11/28/22 0657  WBC 6.7 4.3  HGB 10.6* 10.1*  HCT 35.5* 32.0*  MCV 90.3 86.3  PLT 165 131*    Basic Metabolic Panel: Recent Labs  Lab 11/27/22 1456  11/28/22 0657  NA 141 142  K 4.0 3.4*  CL 109 110  CO2 22 22  GLUCOSE 139* 123*  BUN 44* 34*  CREATININE 1.18* 1.04*  CALCIUM 8.6* 8.1*  MG 1.4* 2.1    GFR: Estimated Creatinine Clearance: 28.4 mL/min (A) (by C-G formula based on SCr of 1.04 mg/dL (H)).  Liver Function Tests: Recent Labs  Lab 11/27/22 1456  AST 21  ALT 15  ALKPHOS 64  BILITOT 0.5  PROT 5.7*  ALBUMIN 3.0*    Recent Labs   Lab 11/27/22 1456  LIPASE 29    CBG: Recent Labs  Lab 11/27/22 1544  GLUCAP 125*     Radiology Studies: DG Chest Portable 1 View  Result Date: 11/27/2022 CLINICAL DATA:  Chest pain.  Weakness. EXAM: PORTABLE CHEST 1 VIEW COMPARISON:  Chest radiograph 06/19/2019, 08/29/2017 FINDINGS: The heart size and mediastinal contours are within normal limits. Aortic calcifications. Mild tortuosity of the thoracic aorta. Both lungs are clear. No pleural effusion or pneumothorax. Multilevel degenerative changes in the mid and distal thoracic spine. IMPRESSION: No active disease. Electronically Signed   By: [name] M.D.   On: 11/27/2022 16:21       LOS: 0 days   [name] [name]  [facility] Pager on [email or web address]  11/28/2022, 10:01 AM

## 2022-11-28 NOTE — Progress Notes (Addendum)
ANTICOAGULATION CONSULT NOTE  Pharmacy Consult for Heparin Indication: chest pain/ACS  Allergies  Allergen Reactions   Celecoxib Swelling    Swelling-leg    Patient Measurements: Height: 5\' 1"  (154.9 cm) Weight: 77.3 kg (170 lb 6.7 oz) IBW/kg (Calculated) : 47.8 Heparin Dosing Weight: 63.6 kg  Vital Signs: Temp: 98 F (36.7 C) (05/11 2106) Temp Source: Oral (05/11 2106) BP: 113/39 (05/12 0441) Pulse Rate: 68 (05/12 0441)  Labs: Recent Labs    11/27/22 1456 11/27/22 1730 11/27/22 2011 11/27/22 2133 11/28/22 0641 11/28/22 0657  HGB 10.6*  --   --   --   --  10.1*  HCT 35.5*  --   --   --   --  32.0*  PLT 165  --   --   --   --  131*  APTT  --   --  32  --   --   --   HEPARINUNFRC  --   --  >1.10*  --  >1.10*  --   CREATININE 1.18*  --   --   --   --  1.04*  TROPONINIHS 27* 49*  --  47*  --   --      Estimated Creatinine Clearance: 28.4 mL/min (A) (by C-G formula based on SCr of 1.04 mg/dL (H)).   Medical History: Past Medical History:  Diagnosis Date   Allergic rhinitis, seasonal    Breast cancer of upper-outer quadrant of right female breast (HCC) 09/13/2014   Chronic venous insufficiency    Dermatophytosis of nail    Dysrhythmia    H/o Atrial fibrillation   Edema leg    Family history of malignant neoplasm of breast    Family history of malignant neoplasm of ovary    Goiter    right thyroid nodule    HTN (hypertension) 08/13/2017   HX: breast cancer    bilateral   Menopausal syndrome    Osteoarthritis    Osteoporosis    Squamous cell skin cancer    right lower leg   Wears glasses    Wears hearing aid    both ears    Medications:  Medications Prior to Admission  Medication Sig Dispense Refill Last Dose   apixaban (ELIQUIS) 5 MG TABS tablet Take 1 tablet (5 mg total) by mouth 2 (two) times daily. 180 tablet 1    atorvastatin (LIPITOR) 40 MG tablet TAKE 1 TABLET BY MOUTH EVERY DAY 90 tablet 1    furosemide (LASIX) 40 MG tablet TAKE 1 TABLET BY  MOUTH EVERY DAY 90 tablet 1    hydroxypropyl methylcellulose / hypromellose (ISOPTO TEARS / GONIOVISC) 2.5 % ophthalmic solution Place 1 drop into both eyes in the morning and at bedtime.      ipratropium (ATROVENT) 0.06 % nasal spray PLACE 2 SPRAYS INTO BOTH NOSTRILS 4 TIMES A DAY 15 mL 10    ketoconazole (NIZORAL) 2 % cream Apply 1 application. topically 2 (two) times daily as needed for irritation.      KLOR-CON M20 20 MEQ tablet TAKE 1 TABLET BY MOUTH EVERY DAY 90 tablet 1    metoprolol succinate (TOPROL-XL) 25 MG 24 hr tablet TAKE 1 TABLET BY MOUTH EVERYDAY AT BEDTIME 90 tablet 1    montelukast (SINGULAIR) 10 MG tablet TAKE 1 TABLET BY MOUTH AT NIGHT FOR COUGH OR WHEEZING 90 tablet 1    Multiple Vitamins-Minerals (PRESERVISION AREDS 2) CAPS Take 1 capsule by mouth 2 (two) times daily.       pantoprazole (  PROTONIX) 40 MG tablet TAKE 1 TABLET BY MOUTH EVERY DAY 90 tablet 3    tamoxifen (NOLVADEX) 20 MG tablet TAKE 1 TABLET BY MOUTH EVERY DAY 90 tablet 3    tobramycin (TOBREX) 0.3 % ophthalmic solution 1 drop 4 (four) times daily.       Scheduled:  Infusions:   heparin 750 Units/hr (11/27/22 2019)   PRN: acetaminophen **OR** acetaminophen  Assessment: 89 yof with a history of AF, HTN, HLD. Patient is presenting with chest pain. Heparin per pharmacy consult placed for chest pain/ACS.  Patient is on apixaban prior to arrival. Last dose 5/11 ~0900. Will require aPTT monitoring due to likely falsely high anti-Xa level secondary to DOAC use.  Hgb 10.1; plt 131. aPTT therapeutic at 84 sec.   Goal of Therapy:  Heparin level 0.3-0.7 units/ml aPTT 66-102 seconds Monitor platelets by anticoagulation protocol: Yes   Plan:  Continue heparin infusion at 750 units/hr Check 8 hour aPTT  Check aPTT & anti-Xa level daily while on heparin Continue to monitor via aPTT until levels are correlated Continue to monitor H&H and platelets  Addendum:  Heparin stopped and apixaban resumed per MD.   Resume apixaban 5 mg bid  Monitor CBC, s/sx of bleeding   Andreas Ohm, PharmD Pharmacy Resident  11/28/2022 1:56 PM

## 2022-11-28 NOTE — Telephone Encounter (Signed)
Patient with plans to discharge today. Please arrange for a 14 day non live monitor. I will send message to scheduling staff to follow up outpatient for review of results.

## 2022-11-28 NOTE — Progress Notes (Addendum)
Progress Note  Patient Name: [name] Date of Encounter: 11/28/2022  Primary Cardiologist: Thurmon Fair, MD   Subjective   Feels well today.  She tells me that she did not actually have chest pain -- more of a "movement" in her chest, and she "didn't feel right"  Inpatient Medications    Scheduled Meds:  aspirin EC  81 mg Oral Daily   atorvastatin  40 mg Oral Daily   hydroxypropyl methylcellulose / hypromellose  1 drop Both Eyes BID   metoprolol succinate  25 mg Oral Daily   pantoprazole  40 mg Oral Daily   Continuous Infusions:  heparin 750 Units/hr (11/27/22 2019)   PRN Meds: acetaminophen **OR** acetaminophen   Vital Signs    Vitals:   11/27/22 2200 11/28/22 0015 11/28/22 0441 11/28/22 0836  BP:  (!) 126/50 (!) 113/39 (!) 138/52  Pulse: 81 69 68   Resp:  20  18  Temp:    97.8 F (36.6 C)  TempSrc:    Oral  SpO2: 96% 96% 95%   Weight:      Height:        Intake/Output Summary (Last 24 hours) at 11/28/2022 1127 Last data filed at 11/28/2022 1610 Gross per 24 hour  Intake 92.8 ml  Output 500 ml  Net -407.2 ml   Filed Weights   11/27/22 1453 11/27/22 2106  Weight: 72.6 kg 77.3 kg    Telemetry     Sinus rhythm - Personally Reviewed  ECG    Sinus tachycardia - Personally Reviewed  Physical Exam   GEN: No acute distress.   Neck: No JVD Cardiac: RRR, no murmurs, rubs, or gallops.  Respiratory: Clear to auscultation bilaterally. GI: Soft, nontender, non-distended  MS: No edema; No deformity. Neuro:  Nonfocal  Psych: Normal affect   Labs    Chemistry Recent Labs  Lab 11/27/22 1456 11/28/22 0657  NA 141 142  K 4.0 3.4*  CL 109 110  CO2 22 22  GLUCOSE 139* 123*  BUN 44* 34*  CREATININE 1.18* 1.04*  CALCIUM 8.6* 8.1*  PROT 5.7*  --   ALBUMIN 3.0*  --   AST 21  --   ALT 15  --   ALKPHOS 64  --   BILITOT 0.5  --   GFRNONAA 42* 49*  ANIONGAP 10 10     Hematology Recent Labs  Lab 11/27/22 1456 11/28/22 0657  WBC 6.7  4.3  RBC 3.93 3.71*  HGB 10.6* 10.1*  HCT 35.5* 32.0*  MCV 90.3 86.3  MCH 27.0 27.2  MCHC 29.9* 31.6  RDW 14.8 14.9  PLT 165 131*    Cardiac EnzymesNo results for input(s): "TROPONINI" in the last 168 hours. No results for input(s): "TROPIPOC" in the last 168 hours.   BNP Recent Labs  Lab 11/27/22 1456  BNP 260.3*     DDimer No results for input(s): "DDIMER" in the last 168 hours.   Summary of Pertinent studies    TTE: pending  Cardiac cath: NA  Imaging: Appearance expected for age, no active process  Labs: hsTnI: 27 -> 49 -> 47 K+ 3.4  Patient Profile     89 y.o. female with a hx of paroxysmal atrial fibrillation, complicated by non-STEMI and transient congestive heart failure in the setting of cholecystectomy in January 2019, hypertension, hyperlipidemia who is being seen 11/27/2022 for the evaluation of chest pain at the request of Dr. Loney Loh.   Assessment & Plan    Chest pain History of  NSTEMI Symptoms as described sound more like an arrhythmia, eg AF, than ischemia hsTnI essentially negative (<50) and flat -- DC heparin Continue ASA 81, metoprolol XL 25 TTE pending -- if TTE returns normal, can DC patient with order for outpatient monitor placement  Chronic CHFpEF Appears compensated  CKD IIIb stable  Hypertension Would not pursue strict control due to risk of orthostatic hypotension  ADDENDUM:  TTE showed normal LV function. No concerning valve abnormalities. LA is severely dilated, which raises suspicion that her symptoms were caused by atrial fibrillaiton.     For questions or updates, please contact [facility] Please consult [email or web address] for contact info under Cardiology/STEMI.      Signed, Maurice Small, MD 11/28/2022, 11:27 AM

## 2022-11-28 NOTE — Discharge Summary (Incomplete)
[facility]  Physician Discharge Summary   Patient ID: [name] MRN: [id number] DOB/AGE: [birth year] 89 y.o.  Admit date: 11/27/2022 Discharge date: 11/28/2022    PCP: Mahlon Gammon, MD  DISCHARGE DIAGNOSES:    NSTEMI (non-ST elevated myocardial infarction) (HCC)   BREAST CANCER, HX OF   Essential hypertension   GERD (gastroesophageal reflux disease)   Paroxysmal atrial fibrillation (HCC)   HLD (hyperlipidemia)   Chronic diastolic heart failure (HCC)   CKD (chronic kidney disease) stage 3, GFR 30-59 ml/min (HCC)   RECOMMENDATIONS FOR OUTPATIENT FOLLOW UP: Cardiology to arrange outpatient follow-up and heart monitor Please repeat TSH and free T4 levels a few weeks.    Home Health: None Equipment/Devices: None  CODE STATUS: DNR  DISCHARGE CONDITION: fair  Diet recommendation: Heart healthy  INITIAL HISTORY:  89 y.o. female with medical history significant of CAD, chronic HFpEF, hyperlipidemia, paroxysmal A-fib on Eliquis, hypertension, GERD, history of breast cancer, CKD stage IIIb presented to ED with chest pain.  Mildly elevated troponin levels noted.  Patient was seen by cardiology.  She was hospitalized for further management.   Consultants: Cardiology   Procedures: Echocardiogram  HOSPITAL COURSE:   Chest pain Mildly elevated troponin levels noted.  Etiology for symptoms not clear.  Currently chest pain-free.  Cardiology has seen the patient.  Patient was taken off of IV heparin.  Started back on anticoagulation.  Echocardiogram shows normal systolic function without any wall motion abnormalities.  Cardiology thinks that her symptoms could have been due to atrial fibrillation.  Cardiology to arrange outpatient follow-up and heart monitor.  Patient keen on going home since she is now symptom-free.  Okay for discharge home today.    Hypomagnesemia/hypokalemia Supplemented.   Chronic diastolic CHF Mild peripheral edema was noted which is chronic.   Grade 2 diastolic dysfunction noted on echocardiogram.  Continue daily furosemide.   Paroxysmal atrial fibrillation Continue with metoprolol and apixaban.  Mildly elevated TSH noted.  Could be sick euthyroid.  Will recommend that free T4 be checked in the outpatient setting.     History of breast cancer Continue tamoxifen.   Chronic kidney disease stage IIIb Stable.   Essential hypertension Continue beta-blocker.     Normocytic anemia Hemoglobin is stable.  Outpatient workup.   Obesity Estimated body mass index is 32.2 kg/m as calculated from the following:   Height as of this encounter: 5\' 1"  (1.549 m).   Weight as of this encounter: 77.3 kg.  Patient is stable.  Okay for discharge home today.  PERTINENT LABS:  The results of significant diagnostics from this hospitalization (including imaging, microbiology, ancillary and laboratory) are listed below for reference.    Labs:   Basic Metabolic Panel: Recent Labs  Lab 11/27/22 1456 11/28/22 0657  NA 141 142  K 4.0 3.4*  CL 109 110  CO2 22 22  GLUCOSE 139* 123*  BUN 44* 34*  CREATININE 1.18* 1.04*  CALCIUM 8.6* 8.1*  MG 1.4* 2.1   Liver Function Tests: Recent Labs  Lab 11/27/22 1456  AST 21  ALT 15  ALKPHOS 64  BILITOT 0.5  PROT 5.7*  ALBUMIN 3.0*   Recent Labs  Lab 11/27/22 1456  LIPASE 29    CBC: Recent Labs  Lab 11/27/22 1456 11/28/22 0657  WBC 6.7 4.3  HGB 10.6* 10.1*  HCT 35.5* 32.0*  MCV 90.3 86.3  PLT 165 131*    BNP: BNP (last 3 results) Recent Labs    05/12/22 1532 11/27/22 1456  BNP 233.4* 260.3*    CBG: Recent Labs  Lab 11/27/22 1544  GLUCAP 125*     IMAGING STUDIES ECHOCARDIOGRAM COMPLETE  Result Date: 11/28/2022    ECHOCARDIOGRAM REPORT   Patient Name:   [name] Date of Exam: 11/28/2022 Medical Rec #:  [id number]      Height:       61.0 in Accession #:    [id number]     Weight:       170.4 lb Date of Birth:  12-30-1921      BSA:          1.765 m Patient  Age:    89 years       BP:           138/52 mmHg Patient Gender: F              HR:           72 bpm. Exam Location:  Inpatient Procedure: 2D Echo, Color Doppler and Cardiac Doppler Indications:    NSTEMI  History:        Patient has prior history of Echocardiogram examinations, most                 recent 08/26/2017. Gread II Diastolic Dysfunction, NSTEMI, CKD,                 Mitral and Tricuspid Regurgitation, Arrythmias:Atrial                 Fibrillation; Risk Factors:Hypertension and Dyslipidemia.  Sonographer:    Milbert Coulter Referring Phys: [id number] RATHORE  Sonographer Comments: Image acquisition challenging due to patient body habitus. IMPRESSIONS  1. Left ventricular ejection fraction, by estimation, is 60 to 65%. The left ventricle has normal function. The left ventricle has no regional wall motion abnormalities. There is mild concentric left ventricular hypertrophy. Left ventricular diastolic parameters are consistent with Grade II diastolic dysfunction (pseudonormalization).  2. Right ventricular systolic function is normal. The right ventricular size is normal. There is mildly elevated pulmonary artery systolic pressure.  3. Left atrial size was severely dilated.  4. Mild mitral valve regurgitation. Severe mitral annular calcification.  5. The aortic valve is tricuspid. There is mild calcification of the aortic valve. Aortic valve regurgitation is not visualized.  6. The inferior vena cava is normal in size with greater than 50% respiratory variability, suggesting right atrial pressure of 3 mmHg. FINDINGS  Left Ventricle: Left ventricular ejection fraction, by estimation, is 60 to 65%. The left ventricle has normal function. The left ventricle has no regional wall motion abnormalities. The left ventricular internal cavity size was normal in size. There is  mild concentric left ventricular hypertrophy. Left ventricular diastolic parameters are consistent with Grade II diastolic dysfunction  (pseudonormalization). Right Ventricle: The right ventricular size is normal. Right ventricular systolic function is normal. There is mildly elevated pulmonary artery systolic pressure. The tricuspid regurgitant velocity is 3.06 m/s, and with an assumed right atrial pressure of 3 mmHg, the estimated right ventricular systolic pressure is 40.5 mmHg. Left Atrium: Left atrial size was severely dilated. Right Atrium: Right atrial size was normal in size. Pericardium: Trivial pericardial effusion is present. Mitral Valve: Severe mitral annular calcification. Mild mitral valve regurgitation. MV peak gradient, 7.7 mmHg. The mean mitral valve gradient is 4.0 mmHg. Tricuspid Valve: Tricuspid valve regurgitation is mild. Aortic Valve: The aortic valve is tricuspid. There is mild calcification of the aortic valve. Aortic valve regurgitation is not visualized. Aortic regurgitation  PHT measures 344 msec. Aortic valve mean gradient measures 6.0 mmHg. Aortic valve peak gradient measures 10.5 mmHg. Aortic valve area, by VTI measures 1.35 cm. Pulmonic Valve: Pulmonic valve regurgitation is mild. Aorta: The aortic root and ascending aorta are structurally normal, with no evidence of dilitation. Venous: The inferior vena cava is normal in size with greater than 50% respiratory variability, suggesting right atrial pressure of 3 mmHg. IAS/Shunts: No atrial level shunt detected by color flow Doppler.  LEFT VENTRICLE PLAX 2D LVIDd:         4.20 cm   Diastology LVIDs:         2.60 cm   LV e' medial:    6.96 cm/s LV PW:         1.20 cm   LV E/e' medial:  23.4 LV IVS:        1.20 cm   LV e' lateral:   8.05 cm/s LVOT diam:     1.50 cm   LV E/e' lateral: 20.2 LV SV:         48 LV SV Index:   27 LVOT Area:     1.77 cm  RIGHT VENTRICLE RV S prime:     11.30 cm/s TAPSE (M-mode): 2.0 cm LEFT ATRIUM              Index        RIGHT ATRIUM           Index LA diam:        3.80 cm  2.15 cm/m   RA Area:     14.00 cm LA Vol (A2C):   62.1 ml  35.19  ml/m  RA Volume:   31.80 ml  18.02 ml/m LA Vol (A4C):   107.0 ml 60.64 ml/m LA Biplane Vol: 86.7 ml  49.13 ml/m  AORTIC VALVE AV Area (Vmax):    1.20 cm AV Area (Vmean):   1.33 cm AV Area (VTI):     1.35 cm AV Vmax:           162.00 cm/s AV Vmean:          111.000 cm/s AV VTI:            0.355 m AV Peak Grad:      10.5 mmHg AV Mean Grad:      6.0 mmHg LVOT Vmax:         110.00 cm/s LVOT Vmean:        83.800 cm/s LVOT VTI:          0.272 m LVOT/AV VTI ratio: 0.77 AI PHT:            344 msec AR Vena Contracta: 0.30 cm  AORTA Ao Root diam: 2.90 cm Ao Asc diam:  2.70 cm MITRAL VALVE                TRICUSPID VALVE MV Area (PHT): 3.23 cm     TR Peak grad:   37.5 mmHg MV Area VTI:   1.26 cm     TR Vmax:        306.00 cm/s MV Peak grad:  7.7 mmHg MV Mean grad:  4.0 mmHg     SHUNTS MV Vmax:       1.39 m/s     Systemic VTI:  0.27 m MV Vmean:      96.3 cm/s    Systemic Diam: 1.50 cm MV Decel Time: 235 msec MV E velocity: 163.00 cm/s MV A velocity: 144.00 cm/s MV E/A  ratio:  1.13 Carolan Clines Electronically signed by Carolan Clines Signature Date/Time: 11/28/2022/12:50:17 PM    Final    DG Chest Portable 1 View  Result Date: 11/27/2022 CLINICAL DATA:  Chest pain.  Weakness. EXAM: PORTABLE CHEST 1 VIEW COMPARISON:  Chest radiograph 06/19/2019, 08/29/2017 FINDINGS: The heart size and mediastinal contours are within normal limits. Aortic calcifications. Mild tortuosity of the thoracic aorta. Both lungs are clear. No pleural effusion or pneumothorax. Multilevel degenerative changes in the mid and distal thoracic spine. IMPRESSION: No active disease. Electronically Signed   By: Sherron Ales M.D.   On: 11/27/2022 16:21    DISCHARGE EXAMINATION: See progress note from earlier today  DISPOSITION: Home  Discharge Instructions     Call MD for:  difficulty breathing, headache or visual disturbances   Complete by: As directed    Call MD for:  extreme fatigue   Complete by: As directed    Call MD for:  persistant  dizziness or light-headedness   Complete by: As directed    Call MD for:  persistant nausea and vomiting   Complete by: As directed    Call MD for:  severe uncontrolled pain   Complete by: As directed    Call MD for:  temperature >100.4   Complete by: As directed    Diet - low sodium heart healthy   Complete by: As directed    Discharge instructions   Complete by: As directed    Cardiology will arrange outpatient follow up. Take your medicines as before.  You were cared for by a hospitalist during your hospital stay. If you have any questions about your discharge medications or the care you received while you were in the hospital after you are discharged, you can call the unit and asked to speak with the hospitalist on call if the hospitalist that took care of you is not available. Once you are discharged, your primary care physician will handle any further medical issues. Please note that NO REFILLS for any discharge medications will be authorized once you are discharged, as it is imperative that you return to your primary care physician (or establish a relationship with a primary care physician if you do not have one) for your aftercare needs so that they can reassess your need for medications and monitor your lab values. If you do not have a primary care physician, you can call [phone] for a physician referral.   Increase activity slowly   Complete by: As directed          Allergies as of 11/28/2022       Reactions   Celecoxib Swelling   Swelling-leg        Medication List     TAKE these medications    apixaban 5 MG Tabs tablet Commonly known as: Eliquis Take 1 tablet (5 mg total) by mouth 2 (two) times daily.   atorvastatin 40 MG tablet Commonly known as: LIPITOR TAKE 1 TABLET BY MOUTH EVERY DAY   furosemide 40 MG tablet Commonly known as: LASIX TAKE 1 TABLET BY MOUTH EVERY DAY   hydroxypropyl methylcellulose / hypromellose 2.5 % ophthalmic solution Commonly  known as: ISOPTO TEARS / GONIOVISC Place 1 drop into both eyes in the morning and at bedtime.   ipratropium 0.06 % nasal spray Commonly known as: ATROVENT PLACE 2 SPRAYS INTO BOTH NOSTRILS 4 TIMES A DAY   ketoconazole 2 % cream Commonly known as: NIZORAL Apply 1 application. topically 2 (two) times daily as needed for irritation.  Klor-Con M20 20 MEQ tablet Generic drug: potassium chloride SA TAKE 1 TABLET BY MOUTH EVERY DAY   metoprolol succinate 25 MG 24 hr tablet Commonly known as: TOPROL-XL TAKE 1 TABLET BY MOUTH EVERYDAY AT BEDTIME   montelukast 10 MG tablet Commonly known as: SINGULAIR TAKE 1 TABLET BY MOUTH AT NIGHT FOR COUGH OR WHEEZING   pantoprazole 40 MG tablet Commonly known as: PROTONIX TAKE 1 TABLET BY MOUTH EVERY DAY   PreserVision AREDS 2 Caps Take 1 capsule by mouth 2 (two) times daily.   tamoxifen 20 MG tablet Commonly known as: NOLVADEX TAKE 1 TABLET BY MOUTH EVERY DAY   tobramycin 0.3 % ophthalmic solution Commonly known as: TOBREX 1 drop 4 (four) times daily.          Follow-up Information     Mahlon Gammon, MD. Schedule an appointment as soon as possible for a visit in 1 week(s).   Specialty: Internal Medicine Why: post hospitalization follow up Contact information: [address] [place] [address] [phone]         Thurmon Fair, MD Follow up.   Specialty: Cardiology Why: Dr. Erin Hearing office will be contacting you about you 14 day heart monitor. There will be instructions on how to use and how to return back to [place]. Additionally, somebody from his office will contact you to schedule a follow up appointment once those results come in. Please call if you have any questions. Contact information: [address] [place] [address] [phone]                 TOTAL DISCHARGE TIME: 35 minutes  Ellar Hakala Rito Ehrlich  [facility] Pager on [email or web address]  11/29/2022, 10:19 AM

## 2022-11-29 ENCOUNTER — Telehealth: Payer: Self-pay | Admitting: Cardiovascular Disease

## 2022-11-29 ENCOUNTER — Ambulatory Visit: Payer: BLUE CROSS/BLUE SHIELD | Attending: Cardiology

## 2022-11-29 ENCOUNTER — Other Ambulatory Visit: Payer: Self-pay | Admitting: Cardiology

## 2022-11-29 DIAGNOSIS — R002 Palpitations: Secondary | ICD-10-CM

## 2022-11-29 DIAGNOSIS — R079 Chest pain, unspecified: Secondary | ICD-10-CM

## 2022-11-29 DIAGNOSIS — I480 Paroxysmal atrial fibrillation: Secondary | ICD-10-CM

## 2022-11-29 NOTE — Telephone Encounter (Signed)
[name] called in asking to speak to nurse for some clarification on POC following pt's hospital visit.

## 2022-11-29 NOTE — Telephone Encounter (Signed)
The cardiac enzyme abnormalities were minimal and the echo was reassuringly normal. The office visit will be more useful if we have the monitor completed and reviewed. I would keep the current appointment, espeially since she is now asymptomatic.

## 2022-11-29 NOTE — Telephone Encounter (Signed)
Daughter ask regarding  seeing Dr Royann Shivers next week. She was in ED on 5/11 and states that Dr Royann Shivers office to order heart monitor. Do see this ordered and mailed. She is currently asymptomatic.  No chest pain, no SOB.  Appt for may 24 for her regular 6 month check up.  Family ask does she need to be sooner from her ED visit?  Please advise.

## 2022-11-29 NOTE — Progress Notes (Unsigned)
Enrolled for [employer] to mail a ZIO XT long term holter monitor to the patients address on file.   Dr. Croitoru to read. 

## 2022-11-30 NOTE — Telephone Encounter (Signed)
Spoke with daughter and gave information from Dr [employer].  She states understanding of all information.  She states the ALF will notify her when monitor comes in and they have made arrangements for nursing staff to place monitor on.  She will let us know if any issues or changes with patient.

## 2022-11-30 NOTE — Telephone Encounter (Signed)
LM to please call back with recommendation from provider    The cardiac enzyme abnormalities were minimal and the echo was reassuringly normal. The office visit will be more useful if we have the monitor completed and reviewed. I would keep the current appointment, espeially since she is now asymptomatic.

## 2022-12-01 ENCOUNTER — Encounter: Payer: MEDICARE | Admitting: Internal Medicine

## 2022-12-01 DIAGNOSIS — I480 Paroxysmal atrial fibrillation: Secondary | ICD-10-CM | POA: Diagnosis not present

## 2022-12-01 DIAGNOSIS — R079 Chest pain, unspecified: Secondary | ICD-10-CM

## 2022-12-01 DIAGNOSIS — R002 Palpitations: Secondary | ICD-10-CM

## 2022-12-06 ENCOUNTER — Encounter: Payer: Self-pay | Admitting: Orthopedic Surgery

## 2022-12-06 ENCOUNTER — Non-Acute Institutional Stay (INDEPENDENT_AMBULATORY_CARE_PROVIDER_SITE_OTHER): Payer: MEDICARE | Admitting: Orthopedic Surgery

## 2022-12-06 VITALS — BP 138/80 | HR 77 | Temp 97.3°F | Resp 18 | Ht 61.0 in | Wt 163.6 lb

## 2022-12-06 DIAGNOSIS — R0609 Other forms of dyspnea: Secondary | ICD-10-CM | POA: Diagnosis not present

## 2022-12-06 DIAGNOSIS — R079 Chest pain, unspecified: Secondary | ICD-10-CM

## 2022-12-06 DIAGNOSIS — I480 Paroxysmal atrial fibrillation: Secondary | ICD-10-CM | POA: Diagnosis not present

## 2022-12-06 NOTE — Progress Notes (Signed)
Careteam: Patient Care Team: Mahlon Gammon, MD as PCP - General (Internal Medicine) Thurmon Fair, MD as PCP - Cardiology (Cardiology) Harriette Bouillon, MD as Consulting Physician (General Surgery) Chipper Herb, MD (Inactive) as Consulting Physician (Radiation Oncology) Pershing Proud, RN as Registered Nurse Donnelly Angelica, RN as Registered Nurse Vida Rigger, MD as Consulting Physician (Gastroenterology) Rachel Moulds, MD as Consulting Physician (Hematology and Oncology) Duke, Roe Rutherford, [place] as Physician Assistant (Cardiology)  Seen by: Hazle Nordmann, AGNP-C  PLACE OF SERVICE:  [facility]  Advanced Directive information    Allergies  Allergen Reactions   Celecoxib Swelling    Swelling-leg    No chief complaint on file.    HPI: Patient is a 89 y.o. female seen today for f/u s/p hospitalization 05/11-05/12.   She was experiencing palpitations and chest discomfort with exertion prior to ED visit. In the ED, she was mildly tachycardic, other vitals stable. Lab work in ED unremarkable. Troponin 27>49, BNP 260, magnesium 1.4. CXR normal. EKG showed mild ST depression on lateral leads. She was given magnesium 1g, aspirin 325 mg and IV heparin. Cardiology consulted. Echo showed normal systolic function. Cardiology thought symptoms could be related to atrial fibrillation> advised outpatient f/u and Zio patch.   Today, she denies palpitations or chest discomfort. Denies shortness of breath when ambulating. Zio patch placed 05/13> ordered x 14 days.  Ankles mildly swollen, but she is wearing compression stockings today. Appetite fair. No recent falls or injuries. She is scheduled to see cardiology 05/24.    Discussed lab follow up in clinic. [facility] plans to call her to schedule.   Review of Systems:  Review of Systems  Constitutional:  Negative for fever and malaise/fatigue.  HENT:  Positive for hearing loss. Negative for sore throat.   Eyes:  Negative for blurred vision.   Respiratory:  Positive for shortness of breath. Negative for cough, sputum production and wheezing.   Cardiovascular:  Positive for palpitations and leg swelling. Negative for chest pain.  Gastrointestinal:  Negative for abdominal pain and blood in stool.  Genitourinary:  Negative for dysuria.  Musculoskeletal:  Negative for falls and joint pain.  Neurological:  Negative for dizziness, weakness and headaches.  Psychiatric/Behavioral:  Positive for memory loss. Negative for depression. The patient is not nervous/anxious.     Past Medical History:  Diagnosis Date   Allergic rhinitis, seasonal    Breast cancer of upper-outer quadrant of right female breast (HCC) 09/13/2014   Chronic venous insufficiency    Dermatophytosis of nail    Dysrhythmia    H/o Atrial fibrillation   Edema leg    Family history of malignant neoplasm of breast    Family history of malignant neoplasm of ovary    Goiter    right thyroid nodule    HTN (hypertension) 08/13/2017   HX: breast cancer    bilateral   Menopausal syndrome    Osteoarthritis    Osteoporosis    Squamous cell skin cancer    right lower leg   Wears glasses    Wears hearing aid    both ears   Past Surgical History:  Procedure Laterality Date   bilateral lumpectomies for breast cancer  Aug. 2007   BREAST LUMPECTOMY WITH RADIOACTIVE SEED LOCALIZATION Right 10/01/2014   Procedure: BREAST LUMPECTOMY WITH RADIOACTIVE SEED LOCALIZATION;  Surgeon: Harriette Bouillon, MD;  Location: [facility];  Service: General;  Laterality: Right;   BREAST LUMPECTOMY WITH RADIOACTIVE SEED LOCALIZATION Left 03/22/2019  Procedure: LEFT BREAST LUMPECTOMY WITH RADIOACTIVE SEED LOCALIZATION;  Surgeon: Harriette Bouillon, MD;  Location: [facility];  Service: General;  Laterality: Left;   BREAST LUMPECTOMY WITH RADIOACTIVE SEED LOCALIZATION Right 08/18/2021   Procedure: RIGHT BREAST LUMPECTOMY WITH RADIOACTIVE SEED LOCALIZATION;  Surgeon: Harriette Bouillon, MD;   Location: [facility];  Service: General;  Laterality: Right;   CATARACT EXTRACTION     CHOLECYSTECTOMY N/A 08/13/2017   Procedure: LAPAROSCOPIC CHOLECYSTECTOMY WITH INTRAOPERATIVE CHOLANGIOGRAM;  Surgeon: Glenna Fellows, MD;  Location: [facility];  Service: General;  Laterality: N/A;   DILATION AND CURETTAGE OF UTERUS     ESOPHAGOGASTRODUODENOSCOPY  07/26/2011   Procedure: ESOPHAGOGASTRODUODENOSCOPY (EGD);  Surgeon: Petra Kuba, MD;  Location: [facility];  Service: Endoscopy;  Laterality: N/A;   history of lumbar compression fracture     left eardum sx  1983   left knee arthroscopic surgery     no screening colonoscopy     s/p bilateral lumpectomies     SAVORY DILATION  07/26/2011   Procedure: SAVORY DILATION;  Surgeon: Petra Kuba, MD;  Location: [facility];  Service: Endoscopy;  Laterality: N/A;   status post resection squamous cell cancer right lower leg     TONSILLECTOMY     UMBILICAL HERNIA REPAIR     Social History:   reports that she has never smoked. She has never used smokeless tobacco. She reports that she does not drink alcohol and does not use drugs.  Family History  Problem Relation Age of Onset   Cancer Mother 50       breast   Heart attack Father    Heart attack Brother    Heart attack Brother    Cancer Maternal Aunt 87       breast   Coronary artery disease Other    Cancer Other        ovarian; daughter of mat aunt w/ BC in 54s   Heart disease Brother    Cancer Maternal Aunt 60       breast    Medications: Patient's Medications  New Prescriptions   No medications on file  Previous Medications   APIXABAN (ELIQUIS) 5 MG TABS TABLET    Take 1 tablet (5 mg total) by mouth 2 (two) times daily.   ATORVASTATIN (LIPITOR) 40 MG TABLET    TAKE 1 TABLET BY MOUTH EVERY DAY   FUROSEMIDE (LASIX) 40 MG TABLET    TAKE 1 TABLET BY MOUTH EVERY DAY   HYDROXYPROPYL METHYLCELLULOSE / HYPROMELLOSE (ISOPTO TEARS / GONIOVISC) 2.5 % OPHTHALMIC SOLUTION    Place 1 drop into both eyes  in the morning and at bedtime.   IPRATROPIUM (ATROVENT) 0.06 % NASAL SPRAY    PLACE 2 SPRAYS INTO BOTH NOSTRILS 4 TIMES A DAY   KETOCONAZOLE (NIZORAL) 2 % CREAM    Apply 1 application. topically 2 (two) times daily as needed for irritation.   KLOR-CON M20 20 MEQ TABLET    TAKE 1 TABLET BY MOUTH EVERY DAY   METOPROLOL SUCCINATE (TOPROL-XL) 25 MG 24 HR TABLET    TAKE 1 TABLET BY MOUTH EVERYDAY AT BEDTIME   MONTELUKAST (SINGULAIR) 10 MG TABLET    TAKE 1 TABLET BY MOUTH AT NIGHT FOR COUGH OR WHEEZING   MULTIPLE VITAMINS-MINERALS (PRESERVISION AREDS 2) CAPS    Take 1 capsule by mouth 2 (two) times daily.    PANTOPRAZOLE (PROTONIX) 40 MG TABLET    TAKE 1 TABLET BY MOUTH EVERY DAY   TAMOXIFEN (NOLVADEX) 20 MG TABLET  TAKE 1 TABLET BY MOUTH EVERY DAY   TOBRAMYCIN (TOBREX) 0.3 % OPHTHALMIC SOLUTION    1 drop 4 (four) times daily.  Modified Medications   No medications on file  Discontinued Medications   No medications on file    Physical Exam:  There were no vitals filed for this visit. There is no height or weight on file to calculate BMI. Wt Readings from Last 3 Encounters:  11/27/22 170 lb 6.7 oz (77.3 kg)  11/17/22 161 lb 12.8 oz (73.4 kg)  08/13/22 165 lb 6.4 oz (75 kg)    Physical Exam Vitals reviewed.  Constitutional:      General: She is not in acute distress. HENT:     Head: Normocephalic.  Eyes:     General:        Right eye: No discharge.        Left eye: No discharge.  Neck:     Vascular: No carotid bruit.     Comments: No JVD Cardiovascular:     Rate and Rhythm: Normal rate and regular rhythm.     Pulses: Normal pulses.     Heart sounds: Normal heart sounds.  Pulmonary:     Effort: Pulmonary effort is normal. No respiratory distress.     Breath sounds: Normal breath sounds. No wheezing or rales.  Abdominal:     General: Bowel sounds are normal.     Palpations: Abdomen is soft.  Musculoskeletal:     Cervical back: Neck supple.     Right lower leg: Edema  present.     Left lower leg: Edema present.     Comments: Non pitting, compression hose on  Lymphadenopathy:     Cervical: No cervical adenopathy.  Skin:    General: Skin is warm.     Capillary Refill: Capillary refill takes less than 2 seconds.  Neurological:     General: No focal deficit present.     Mental Status: She is alert. Mental status is at baseline.     Motor: Weakness present.     Gait: Gait abnormal.     Comments: rolator  Psychiatric:        Mood and Affect: Mood normal.     Labs reviewed: Basic Metabolic Panel: Recent Labs    05/12/22 1532 07/22/22 1546 11/08/22 0807 11/27/22 1456 11/28/22 0657  NA 142   < > 143 141 142  K 4.6   < > 3.7 4.0 3.4*  CL 104   < > 107 109 110  CO2 25   < > 27 22 22   GLUCOSE 128*   < > 123* 139* 123*  BUN 35   < > 41* 44* 34*  CREATININE 1.22*   < > 1.22* 1.18* 1.04*  CALCIUM 9.0   < > 8.6 8.6* 8.1*  MG 1.8  --   --  1.4* 2.1  TSH 3.160  --  6.70*  --   --    < > = values in this interval not displayed.   Liver Function Tests: Recent Labs    07/22/22 1546 11/08/22 0807 11/27/22 1456  AST 16 15 21   ALT 13 13 15   ALKPHOS 74  --  64  BILITOT 0.4 0.5 0.5  PROT 6.1* 6.1 5.7*  ALBUMIN 3.8  --  3.0*   Recent Labs    11/27/22 1456  LIPASE 29   No results for input(s): "AMMONIA" in the last 8760 hours. CBC: Recent Labs    11/08/22 0807 11/27/22 1456 11/28/22  0657  WBC 5.2 6.7 4.3  NEUTROABS 3,765  --   --   HGB 11.5* 10.6* 10.1*  HCT 35.9 35.5* 32.0*  MCV 86.1 90.3 86.3  PLT 200 165 131*   Lipid Panel: Recent Labs    11/08/22 0807  CHOL 124  HDL 47*  LDLCALC 56  TRIG 161  CHOLHDL 2.6   TSH: Recent Labs    05/12/22 1532 11/08/22 0807  TSH 3.160 6.70*   A1C: Lab Results  Component Value Date   HGBA1C 5.8 (H) 01/01/2021     Assessment/Plan 1. Paroxysmal atrial fibrillation (HCC) - hospitalized 05/11-05/12 - EKG noted mild ST depression - HR< 100 - 05/13 Zio patch started x 14 days -  cont metoprolol for rate control - cont Eliquis for clot prevention - plan to schedule lab work at [facility] clinic in 4 weeks - TSH; Future - T4, Free; Future  2. Exertional chest pain - see above - troponin 27> 49 - cardiology consulted> thought to be afib - asymptomatic today - cardiology f/u 05/24  3. Dyspnea on exertion - vitals stable, denies DOE today - lung sounds clear, sats> 90% on RA  Total time: 31 minutes. Greater than 50% of total time spent doing patient education regarding palpitations and DOE including symptom/medication management.      Next appt: 01/03/2023  Hazle Nordmann, Juel Burrow  [facility] [phone]

## 2022-12-06 NOTE — Patient Instructions (Addendum)
[facility] will call to scheduled lab visit at [facility] clinic>need thyroid level checked   Please call office if you experience any chest pain, palpitation, shortness of breath or calf pain

## 2022-12-10 ENCOUNTER — Ambulatory Visit: Payer: MEDICARE | Attending: Cardiovascular Disease | Admitting: Cardiovascular Disease

## 2022-12-10 ENCOUNTER — Encounter: Payer: Self-pay | Admitting: Cardiovascular Disease

## 2022-12-10 VITALS — BP 140/54 | HR 62 | Ht 61.0 in | Wt 161.0 lb

## 2022-12-10 DIAGNOSIS — I480 Paroxysmal atrial fibrillation: Secondary | ICD-10-CM | POA: Insufficient documentation

## 2022-12-10 DIAGNOSIS — E7800 Pure hypercholesterolemia, unspecified: Secondary | ICD-10-CM | POA: Diagnosis not present

## 2022-12-10 DIAGNOSIS — I5032 Chronic diastolic (congestive) heart failure: Secondary | ICD-10-CM

## 2022-12-10 DIAGNOSIS — I25119 Atherosclerotic heart disease of native coronary artery with unspecified angina pectoris: Secondary | ICD-10-CM | POA: Insufficient documentation

## 2022-12-10 DIAGNOSIS — D6869 Other thrombophilia: Secondary | ICD-10-CM

## 2022-12-10 DIAGNOSIS — I10 Essential (primary) hypertension: Secondary | ICD-10-CM | POA: Diagnosis not present

## 2022-12-10 MED ORDER — POTASSIUM CHLORIDE CRYS ER 20 MEQ PO TBCR: 40.0000 meq | EXTENDED_RELEASE_TABLET | Freq: Every day | ORAL | 3 refills | Status: DC

## 2022-12-10 MED ORDER — FUROSEMIDE 40 MG PO TABS: 60.0000 mg | ORAL_TABLET | Freq: Every day | ORAL | 3 refills | Status: DC

## 2022-12-10 NOTE — Progress Notes (Signed)
Cardiology office note   Date:  12/10/2022   ID:  [name], DOB 1922/07/08, MRN [id number]  PCP:  Mahlon Gammon, MD  Cardiologist:  Thurmon Fair, MD  Electrophysiologist:  None   Evaluation Performed:  Follow-Up Visit  Chief Complaint: Atrial fibrillation  History of Present Illness:    [name] is a 89 y.o. female with history of paroxysmal atrial fibrillation, complicated by non-STEMI and transient congestive heart failure with preserved LVEF in the setting of cholecystectomy in January 2019.  She had COVID-19 pneumonia with hypoxemia and required brief hospitalization in late 2020, recovered well from this.  Additional medical problems include a history of breast cancer, GERD, hypertension, CKD stage III.  She is in the office today after recent brief hospitalization.  According to the chart this was triggered by exertional chest discomfort, but to me it sounds more like she was describing rapid palpitations.  There was a marginal increase in high-sensitivity troponin to a maximum of only 49.  The ECG did not show ischemic changes.  She was briefly treated with IV heparin and had a repeat echocardiogram.  This showed an identical pattern with the previous echo with an area of apical akinesis but overall preserved left ventricular systolic function.  Again noted was "pseudonormal mitral inflow", elevated mean left atrial pressure.  She is feeling well today.  She is to celebrate her 89th birthday next week.  Her husband will soon be 102.  They have celebrated 89 years of marriage and were on the front page of our local newspaper on Valentine's Day.  Today her biggest complaint is of bilateral lower extremity edema that is a little worse than usual despite using compression stockings.  She has not had any weeping wounds, blisters or evidence of infection.  The swelling is much better in the morning and worsens throughout the day.  She does not have orthopnea or PND.  She  denies shortness of breath, but her family members reports that she lags behind her husband when walking.  They both use walkers.  She denies any new palpitations since her recent hospitalization.  She is on Eliquis and has not had any falls or bleeding problems.  Unfortunately due to a change in the rules surrounding income, they no longer qualify for the manufacturer patient assistance program.   Past Medical History:  Diagnosis Date   Allergic rhinitis, seasonal    Breast cancer of upper-outer quadrant of right female breast (HCC) 09/13/2014   Chronic venous insufficiency    Dermatophytosis of nail    Dysrhythmia    H/o Atrial fibrillation   Edema leg    Family history of malignant neoplasm of breast    Family history of malignant neoplasm of ovary    Goiter    right thyroid nodule    HTN (hypertension) 08/13/2017   HX: breast cancer    bilateral   Menopausal syndrome    Osteoarthritis    Osteoporosis    Squamous cell skin cancer    right lower leg   Wears glasses    Wears hearing aid    both ears   Past Surgical History:  Procedure Laterality Date   bilateral lumpectomies for breast cancer  Aug. 2007   BREAST LUMPECTOMY WITH RADIOACTIVE SEED LOCALIZATION Right 10/01/2014   Procedure: BREAST LUMPECTOMY WITH RADIOACTIVE SEED LOCALIZATION;  Surgeon: Harriette Bouillon, MD;  Location: [facility];  Service: General;  Laterality: Right;   BREAST LUMPECTOMY WITH RADIOACTIVE SEED LOCALIZATION  Left 03/22/2019   Procedure: LEFT BREAST LUMPECTOMY WITH RADIOACTIVE SEED LOCALIZATION;  Surgeon: Harriette Bouillon, MD;  Location: [facility];  Service: General;  Laterality: Left;   BREAST LUMPECTOMY WITH RADIOACTIVE SEED LOCALIZATION Right 08/18/2021   Procedure: RIGHT BREAST LUMPECTOMY WITH RADIOACTIVE SEED LOCALIZATION;  Surgeon: Harriette Bouillon, MD;  Location: [facility];  Service: General;  Laterality: Right;   CATARACT EXTRACTION     CHOLECYSTECTOMY N/A 08/13/2017   Procedure: LAPAROSCOPIC  CHOLECYSTECTOMY WITH INTRAOPERATIVE CHOLANGIOGRAM;  Surgeon: Glenna Fellows, MD;  Location: [facility];  Service: General;  Laterality: N/A;   DILATION AND CURETTAGE OF UTERUS     ESOPHAGOGASTRODUODENOSCOPY  07/26/2011   Procedure: ESOPHAGOGASTRODUODENOSCOPY (EGD);  Surgeon: Petra Kuba, MD;  Location: [facility];  Service: Endoscopy;  Laterality: N/A;   history of lumbar compression fracture     left eardum sx  1983   left knee arthroscopic surgery     no screening colonoscopy     s/p bilateral lumpectomies     SAVORY DILATION  07/26/2011   Procedure: SAVORY DILATION;  Surgeon: Petra Kuba, MD;  Location: [facility];  Service: Endoscopy;  Laterality: N/A;   status post resection squamous cell cancer right lower leg     TONSILLECTOMY     UMBILICAL HERNIA REPAIR       Current Meds  Medication Sig   apixaban (ELIQUIS) 5 MG TABS tablet Take 1 tablet (5 mg total) by mouth 2 (two) times daily.   atorvastatin (LIPITOR) 40 MG tablet TAKE 1 TABLET BY MOUTH EVERY DAY   ipratropium (ATROVENT) 0.06 % nasal spray PLACE 2 SPRAYS INTO BOTH NOSTRILS 4 TIMES A DAY   metoprolol succinate (TOPROL-XL) 25 MG 24 hr tablet TAKE 1 TABLET BY MOUTH EVERYDAY AT BEDTIME   montelukast (SINGULAIR) 10 MG tablet TAKE 1 TABLET BY MOUTH AT NIGHT FOR COUGH OR WHEEZING   Multiple Vitamins-Minerals (PRESERVISION AREDS 2) CAPS Take 1 capsule by mouth 2 (two) times daily.    pantoprazole (PROTONIX) 40 MG tablet TAKE 1 TABLET BY MOUTH EVERY DAY   tamoxifen (NOLVADEX) 20 MG tablet TAKE 1 TABLET BY MOUTH EVERY DAY   [DISCONTINUED] furosemide (LASIX) 40 MG tablet TAKE 1 TABLET BY MOUTH EVERY DAY   [DISCONTINUED] KLOR-CON M20 20 MEQ tablet TAKE 1 TABLET BY MOUTH EVERY DAY     Allergies:   Celecoxib   Social History   Tobacco Use   Smoking status: Never   Smokeless tobacco: Never  Vaping Use   Vaping Use: Never used  Substance Use Topics   Alcohol use: No   Drug use: No     Family Hx: The patient's family history  includes Cancer in an other family member; Cancer (age of onset: 64) in her maternal aunt; Cancer (age of onset: 81) in her maternal aunt; Cancer (age of onset: 77) in her mother; Coronary artery disease in an other family member; Heart attack in her brother, brother, and father; Heart disease in her brother.  ROS:   Please see the history of present illness.   All other systems are reviewed and are negative.   Prior CV studies:   The following studies were reviewed today:  Echocardiogram 11/28/2022   1. Left ventricular ejection fraction, by estimation, is 60 to 65%. The  left ventricle has normal function. The left ventricle has no regional  wall motion abnormalities. There is mild concentric left ventricular  hypertrophy. Left ventricular diastolic  parameters are consistent with Grade II diastolic dysfunction  (pseudonormalization).  2. Right ventricular systolic function is normal. The right ventricular  size is normal. There is mildly elevated pulmonary artery systolic  pressure.   3. Left atrial size was severely dilated.   4. Mild mitral valve regurgitation. Severe mitral annular calcification.   5. The aortic valve is tricuspid. There is mild calcification of the  aortic valve. Aortic valve regurgitation is not visualized.   6. The inferior vena cava is normal in size with greater than 50%  respiratory variability, suggesting right atrial pressure of 3 mmHg.   Labs/Other Tests and Data Reviewed:    EKG: Not ordered today.  Personally reviewed the tracing from 11/27/2022.  It shows sinus rhythm with premature atrial contractions and probably a brief burst of ectopic atrial tachycardia, pre-existing LVH/left axis deviation/probable left anterior fascicular block QTc 484 ms  Recent Labs: 11/08/2022: TSH 6.70 11/27/2022: ALT 15; B Natriuretic Peptide 260.3 11/28/2022: BUN 34; Creatinine, Ser 1.04; Hemoglobin 10.1; Magnesium 2.1; Platelets 131; Potassium 3.4; Sodium 142   Recent  Lipid Panel Lab Results  Component Value Date/Time   CHOL 124 11/08/2022 08:07 AM   CHOL 109 09/19/2017 12:00 AM   TRIG 128 11/08/2022 08:07 AM   TRIG 107 09/19/2017 12:00 AM   HDL 47 (L) 11/08/2022 08:07 AM   CHOLHDL 2.6 11/08/2022 08:07 AM   LDLCALC 56 11/08/2022 08:07 AM   LDLCALC 59 09/19/2017 12:00 AM   LDLDIRECT 173.9 06/25/2013 09:48 AM    Wt Readings from Last 3 Encounters:  12/10/22 161 lb (73 kg)  12/06/22 163 lb 9.6 oz (74.2 kg)  11/27/22 170 lb 6.7 oz (77.3 kg)     Objective:    Vital Signs:  BP (!) 140/54 (BP Location: Left Arm, Patient Position: Sitting, Cuff Size: Normal)   Pulse 62   Ht 5\' 1"  (1.549 m)   Wt 161 lb (73 kg)   SpO2 99%   BMI 30.42 kg/m     General: Alert, oriented x3, no distress, appears well.  Mildly obese.  Looks younger than stated age. Head: no evidence of trauma, PERRL, EOMI, no exophtalmos or lid lag, no myxedema, no xanthelasma; normal ears, nose and oropharynx Neck: normal jugular venous pulsations and no hepatojugular reflux; brisk carotid pulses without delay and no carotid bruits Chest: clear to auscultation, no signs of consolidation by percussion or palpation, normal fremitus, symmetrical and full respiratory excursions Cardiovascular: normal position and quality of the apical impulse, regular rhythm, normal first and second heart sounds, no murmurs, rubs or gallops Abdomen: no tenderness or distention, no masses by palpation, no abnormal pulsatility or arterial bruits, normal bowel sounds, no hepatosplenomegaly Extremities: no clubbing, cyanosis or edema; 2+ radial, ulnar and brachial pulses bilaterally; 2+ right femoral, posterior tibial and dorsalis pedis pulses; 2+ left femoral, posterior tibial and dorsalis pedis pulses; no subclavian or femoral bruits Neurological: grossly nonfocal except very hard of hearing Psych: Normal mood and affect    Assessment and plan  1. Paroxysmal atrial fibrillation (HCC)   2. Chronic  diastolic heart failure (HCC)   3. Acquired thrombophilia (HCC)   4. Hypercholesterolemia   5. Coronary artery disease involving native coronary artery of native heart with angina pectoris (HCC)   6. Essential hypertension        AFib: I agree with Dr. Morrie Sheldon assessment when he consulted during her hospital stay, that she most likely had an arrhythmia such as atrial fibrillation, rather than a new acute ischemic event.  We know that she probably has a high-grade stenosis  or occlusion in the LAD artery to explain her apical abnormality.  It's quite likely she would develop some degree of ischemia or injury in the residual viable tissue in that territory, particularly if she develops a tachyarrhythmia.  She is on chronic anticoagulation and takes a beta-blocker.   CHA2DS2-VASc score 5-6 (age 70, gender,  CHF, +/-suspected CAD, hypertension). CHF: She has lower extremity edema that has worsened despite compression stockings and she has echo evidence of elevated left atrial pressure as well.  Increase furosemide to 60 mg a day.  Increase potassium chloride to 20 mill equivalents twice daily. Anticoagulation: Well-tolerated without bleeding issues.  Need to see if we can assist with the cost of the medication.  Consider switching to generic dabigatran if the price comes down. HLP: On statin, with excellent reduction in LDL cholesterol at 56. CAD: Suspected but not proven since we decided not to proceed angiography.  Typically she is asymptomatic and is likely her symptoms are only provoked by arrhythmia.  Plan to continue with conservative management.   Suspect underlying CAD, but conservative management preferred in view of age and preserved left ventricular systolic function.  Medications include statin and beta-blocker.  Not on aspirin due to full anticoagulation with apixaban. HTN: Borderline high systolic blood pressure and wide pulse pressure consistent with noncompliant arterial system.  I would  not increase medications for blood pressure.    Patient Instructions  Medication Instructions:  Increase Furosemide to 60mg  a day Increase KDur to 40 meq a day *If you need a refill on your cardiac medications before your next appointment, please call your pharmacy*  Follow-Up: At [facility], you and your health needs are our priority.  As part of our continuing mission to provide you with exceptional heart care, we have created designated Provider Care Teams.  These Care Teams include your primary Cardiologist (physician) and Advanced Practice Providers (APPs -  Physician Assistants and Nurse Practitioners) who all work together to provide you with the care you need, when you need it.  We recommend signing up for the patient portal called "MyChart".  Sign up information is provided on this After Visit Summary.  MyChart is used to connect with patients for Virtual Visits (Telemedicine).  Patients are able to view lab/test results, encounter notes, upcoming appointments, etc.  Non-urgent messages can be sent to your provider as well.   To learn more about what you can do with MyChart, go to [email or web address].    Your next appointment:   3-4 month(s)  Provider:   Thurmon Fair, MD      Signed, Thurmon Fair, MD  12/10/2022 5:23 PM    [facility]

## 2022-12-10 NOTE — Patient Instructions (Signed)
Medication Instructions:  Increase Furosemide to 60mg  a day Increase KDur to 40 meq a day *If you need a refill on your cardiac medications before your next appointment, please call your pharmacy*  Follow-Up: At [facility], you and your health needs are our priority.  As part of our continuing mission to provide you with exceptional heart care, we have created designated Provider Care Teams.  These Care Teams include your primary Cardiologist (physician) and Advanced Practice Providers (APPs -  Physician Assistants and Nurse Practitioners) who all work together to provide you with the care you need, when you need it.  We recommend signing up for the patient portal called "MyChart".  Sign up information is provided on this After Visit Summary.  MyChart is used to connect with patients for Virtual Visits (Telemedicine).  Patients are able to view lab/test results, encounter notes, upcoming appointments, etc.  Non-urgent messages can be sent to your provider as well.   To learn more about what you can do with MyChart, go to [email or web address].    Your next appointment:   3-4 month(s)  Provider:   Thurmon Fair, MD

## 2022-12-14 ENCOUNTER — Other Ambulatory Visit: Payer: Self-pay | Admitting: Cardiovascular Disease

## 2022-12-14 DIAGNOSIS — R2681 Unsteadiness on feet: Secondary | ICD-10-CM | POA: Diagnosis not present

## 2022-12-14 DIAGNOSIS — M6281 Muscle weakness (generalized): Secondary | ICD-10-CM | POA: Diagnosis not present

## 2022-12-14 DIAGNOSIS — R278 Other lack of coordination: Secondary | ICD-10-CM | POA: Diagnosis not present

## 2022-12-14 NOTE — Telephone Encounter (Signed)
Pt last saw Dr Royann Shivers 12/10/22, last labs 11/28/22 Creat 1.04, age 89, weight 73kg, based on specified criteria pt is on appropriate dosage of Eliquis 5mg  BID for afib.  Will refill rx.

## 2022-12-15 DIAGNOSIS — H353123 Nonexudative age-related macular degeneration, left eye, advanced atrophic without subfoveal involvement: Secondary | ICD-10-CM | POA: Diagnosis not present

## 2022-12-15 DIAGNOSIS — H353211 Exudative age-related macular degeneration, right eye, with active choroidal neovascularization: Secondary | ICD-10-CM | POA: Diagnosis not present

## 2022-12-15 DIAGNOSIS — H35373 Puckering of macula, bilateral: Secondary | ICD-10-CM | POA: Diagnosis not present

## 2022-12-15 DIAGNOSIS — H43813 Vitreous degeneration, bilateral: Secondary | ICD-10-CM | POA: Diagnosis not present

## 2022-12-17 ENCOUNTER — Other Ambulatory Visit: Payer: Self-pay

## 2022-12-17 DIAGNOSIS — I480 Paroxysmal atrial fibrillation: Secondary | ICD-10-CM

## 2022-12-21 DIAGNOSIS — R002 Palpitations: Secondary | ICD-10-CM | POA: Diagnosis not present

## 2022-12-21 DIAGNOSIS — R079 Chest pain, unspecified: Secondary | ICD-10-CM | POA: Diagnosis not present

## 2022-12-21 DIAGNOSIS — R278 Other lack of coordination: Secondary | ICD-10-CM | POA: Diagnosis not present

## 2022-12-21 DIAGNOSIS — R2681 Unsteadiness on feet: Secondary | ICD-10-CM | POA: Diagnosis not present

## 2022-12-21 DIAGNOSIS — M6281 Muscle weakness (generalized): Secondary | ICD-10-CM | POA: Diagnosis not present

## 2022-12-23 DIAGNOSIS — R278 Other lack of coordination: Secondary | ICD-10-CM | POA: Diagnosis not present

## 2022-12-23 DIAGNOSIS — R2681 Unsteadiness on feet: Secondary | ICD-10-CM | POA: Diagnosis not present

## 2022-12-23 DIAGNOSIS — M6281 Muscle weakness (generalized): Secondary | ICD-10-CM | POA: Diagnosis not present

## 2022-12-27 ENCOUNTER — Other Ambulatory Visit: Payer: MEDICARE

## 2022-12-27 DIAGNOSIS — I480 Paroxysmal atrial fibrillation: Secondary | ICD-10-CM | POA: Diagnosis not present

## 2022-12-28 ENCOUNTER — Ambulatory Visit (INDEPENDENT_AMBULATORY_CARE_PROVIDER_SITE_OTHER): Payer: MEDICARE | Admitting: Podiatry

## 2022-12-28 DIAGNOSIS — M79676 Pain in unspecified toe(s): Secondary | ICD-10-CM | POA: Diagnosis not present

## 2022-12-28 DIAGNOSIS — B351 Tinea unguium: Secondary | ICD-10-CM

## 2022-12-28 DIAGNOSIS — D2371 Other benign neoplasm of skin of right lower limb, including hip: Secondary | ICD-10-CM

## 2022-12-28 DIAGNOSIS — D689 Coagulation defect, unspecified: Secondary | ICD-10-CM | POA: Diagnosis not present

## 2022-12-28 LAB — T4, FREE: Free T4: 1.1 ng/dL (ref 0.8–1.8)

## 2022-12-28 LAB — TSH: TSH: 6.33 mIU/L — ABNORMAL HIGH (ref 0.40–4.50)

## 2022-12-28 NOTE — Progress Notes (Signed)
She presents today chief complaint of painful elongated toenails.  Also complaining of painful calluses.  Objective: Pulses are palpable.  Benign skin lesions plantar aspect of the forefoot bilaterally no open lesions or wounds.  Toenails are long thick yellow dystrophic clinically mycotic.  Assessment: Pain in limb secondary to benign skin lesions and onychomycosis.  Plan: Debridement of toenails 1 through 5 bilateral debridement of benign skin lesions.

## 2022-12-30 DIAGNOSIS — R2681 Unsteadiness on feet: Secondary | ICD-10-CM | POA: Diagnosis not present

## 2022-12-30 DIAGNOSIS — R278 Other lack of coordination: Secondary | ICD-10-CM | POA: Diagnosis not present

## 2022-12-30 DIAGNOSIS — M6281 Muscle weakness (generalized): Secondary | ICD-10-CM | POA: Diagnosis not present

## 2023-01-03 ENCOUNTER — Non-Acute Institutional Stay (INDEPENDENT_AMBULATORY_CARE_PROVIDER_SITE_OTHER): Payer: MEDICARE | Admitting: Orthopedic Surgery

## 2023-01-03 ENCOUNTER — Encounter: Payer: Self-pay | Admitting: Orthopedic Surgery

## 2023-01-03 VITALS — BP 118/58 | HR 78 | Temp 97.1°F | Resp 16 | Ht 61.0 in | Wt 160.8 lb

## 2023-01-03 DIAGNOSIS — Z0000 Encounter for general adult medical examination without abnormal findings: Secondary | ICD-10-CM | POA: Diagnosis not present

## 2023-01-03 NOTE — Patient Instructions (Signed)
  [name] , Thank you for taking time to come for your Medicare Wellness Visit. I appreciate your ongoing commitment to your health goals. Please review the following plan we discussed and let me know if I can assist you in the future.   These are the goals we discussed:  Goals      Patient Stated     Maintain current level of health        This is a list of the screening recommended for you and due dates:  Health Maintenance  Topic Date Due   Zoster (Shingles) Vaccine (1 of 2) Never done   COVID-19 Vaccine (5 - 2023-24 season) 03/19/2022   Flu Shot  02/17/2023   Medicare Annual Wellness Visit  01/03/2024   Pneumonia Vaccine  Completed   DEXA scan (bone density measurement)  Completed   HPV Vaccine  Aged Out   DTaP/Tdap/Td vaccine  Discontinued   Mammogram  Discontinued

## 2023-01-03 NOTE — Progress Notes (Addendum)
Subjective:   [name] is a 89 y.o. female who presents for Medicare Annual (Subsequent) preventive examination.  Place of Service: [facility] clinic Provider: Hazle Nordmann, AGNP-C   Review of Systems     Cardiac Risk Factors include: advanced age (>27men, >28 women);hypertension;obesity (BMI >30kg/m2)     Objective:    Today's Vitals   01/03/23 1315  BP: (!) 118/58  Pulse: 78  Resp: 16  Temp: (!) 97.1 F (36.2 C)  SpO2: 94%  Weight: 160 lb 12.8 oz (72.9 kg)  Height: 5\' 1"  (1.549 m)   Body mass index is 30.38 kg/m.     01/03/2023    1:23 PM 11/27/2022    8:00 PM 11/27/2022    2:57 PM 11/17/2022   10:09 AM 07/07/2022   10:13 AM 06/02/2022   10:03 AM 10/12/2021    8:42 AM  Advanced Directives  Does Patient Have a Medical Advance Directive? Yes Yes Yes Yes Yes Yes Yes  Type of Estate agent of [place];Living will;Out of facility DNR (pink MOST or yellow form) Healthcare Power of [place];Living will Healthcare Power of [place];Living will Healthcare Power of [place];Living will;Out of facility DNR (pink MOST or yellow form) Living will;Out of facility DNR (pink MOST or yellow form);Healthcare Power of Attorney Living will;Out of facility DNR (pink MOST or yellow form);Healthcare Power of [employer] of facility DNR (pink MOST or yellow form)  Does patient want to make changes to medical advance directive? No - Patient declined No - Patient declined  No - Patient declined No - Patient declined No - Patient declined No - Patient declined  Copy of Healthcare Power of Attorney in Chart?     No - copy requested No - copy requested     Current Medications (verified) Outpatient Encounter Medications as of 01/03/2023  Medication Sig   apixaban (ELIQUIS) 5 MG TABS tablet TAKE 1 TABLET BY MOUTH TWICE A DAY   atorvastatin (LIPITOR) 40 MG tablet TAKE 1 TABLET BY MOUTH EVERY DAY   furosemide (LASIX) 40 MG tablet Take 1.5 tablets (60 mg total) by mouth  daily.   ipratropium (ATROVENT) 0.06 % nasal spray PLACE 2 SPRAYS INTO BOTH NOSTRILS 4 TIMES A DAY   metoprolol succinate (TOPROL-XL) 25 MG 24 hr tablet TAKE 1 TABLET BY MOUTH EVERYDAY AT BEDTIME   ketoconazole (NIZORAL) 2 % cream Apply 1 application  topically 2 (two) times daily as needed for irritation.   montelukast (SINGULAIR) 10 MG tablet TAKE 1 TABLET BY MOUTH AT NIGHT FOR COUGH OR WHEEZING   Multiple Vitamins-Minerals (PRESERVISION AREDS 2) CAPS Take 1 capsule by mouth 2 (two) times daily.    pantoprazole (PROTONIX) 40 MG tablet TAKE 1 TABLET BY MOUTH EVERY DAY   potassium chloride SA (KLOR-CON M20) 20 MEQ tablet Take 2 tablets (40 mEq total) by mouth daily.   tamoxifen (NOLVADEX) 20 MG tablet TAKE 1 TABLET BY MOUTH EVERY DAY   tobramycin (TOBREX) 0.3 % ophthalmic solution 1 drop 4 (four) times daily. (Patient not taking: Reported on 12/10/2022)   [DISCONTINUED] hydroxypropyl methylcellulose / hypromellose (ISOPTO TEARS / GONIOVISC) 2.5 % ophthalmic solution Place 1 drop into both eyes in the morning and at bedtime. (Patient not taking: Reported on 12/10/2022)   No facility-administered encounter medications on file as of 01/03/2023.    Allergies (verified) Celecoxib   History: Past Medical History:  Diagnosis Date   Allergic rhinitis, seasonal    Breast cancer of upper-outer quadrant of right female breast (  HCC) 09/13/2014   Chronic venous insufficiency    Dermatophytosis of nail    Dysrhythmia    H/o Atrial fibrillation   Edema leg    Family history of malignant neoplasm of breast    Family history of malignant neoplasm of ovary    Goiter    right thyroid nodule    HTN (hypertension) 08/13/2017   HX: breast cancer    bilateral   Menopausal syndrome    Osteoarthritis    Osteoporosis    Squamous cell skin cancer    right lower leg   Wears glasses    Wears hearing aid    both ears   Past Surgical History:  Procedure Laterality Date   bilateral lumpectomies for breast  cancer  Aug. 2007   BREAST LUMPECTOMY WITH RADIOACTIVE SEED LOCALIZATION Right 10/01/2014   Procedure: BREAST LUMPECTOMY WITH RADIOACTIVE SEED LOCALIZATION;  Surgeon: Harriette Bouillon, MD;  Location: [facility];  Service: General;  Laterality: Right;   BREAST LUMPECTOMY WITH RADIOACTIVE SEED LOCALIZATION Left 03/22/2019   Procedure: LEFT BREAST LUMPECTOMY WITH RADIOACTIVE SEED LOCALIZATION;  Surgeon: Harriette Bouillon, MD;  Location: [facility];  Service: General;  Laterality: Left;   BREAST LUMPECTOMY WITH RADIOACTIVE SEED LOCALIZATION Right 08/18/2021   Procedure: RIGHT BREAST LUMPECTOMY WITH RADIOACTIVE SEED LOCALIZATION;  Surgeon: Harriette Bouillon, MD;  Location: [facility];  Service: General;  Laterality: Right;   CATARACT EXTRACTION     CHOLECYSTECTOMY N/A 08/13/2017   Procedure: LAPAROSCOPIC CHOLECYSTECTOMY WITH INTRAOPERATIVE CHOLANGIOGRAM;  Surgeon: Glenna Fellows, MD;  Location: [facility];  Service: General;  Laterality: N/A;   DILATION AND CURETTAGE OF UTERUS     ESOPHAGOGASTRODUODENOSCOPY  07/26/2011   Procedure: ESOPHAGOGASTRODUODENOSCOPY (EGD);  Surgeon: Petra Kuba, MD;  Location: [facility];  Service: Endoscopy;  Laterality: N/A;   history of lumbar compression fracture     left eardum sx  1983   left knee arthroscopic surgery     no screening colonoscopy     s/p bilateral lumpectomies     SAVORY DILATION  07/26/2011   Procedure: SAVORY DILATION;  Surgeon: Petra Kuba, MD;  Location: [facility];  Service: Endoscopy;  Laterality: N/A;   status post resection squamous cell cancer right lower leg     TONSILLECTOMY     UMBILICAL HERNIA REPAIR     Family History  Problem Relation Age of Onset   Cancer Mother 51       breast   Heart attack Father    Heart attack Brother    Heart attack Brother    Cancer Maternal Aunt 29       breast   Coronary artery disease Other    Cancer Other        ovarian; daughter of mat aunt w/ BC in 13s   Heart disease Brother    Cancer Maternal  Aunt 53       breast   Social History   Socioeconomic History   Marital status: Married    Spouse name: [name]   Number of children: Not on file   Years of education: Not on file   Highest education level: Not on file  Occupational History   Not on file  Tobacco Use   Smoking status: Never   Smokeless tobacco: Never  Vaping Use   Vaping Use: Never used  Substance and Sexual Activity   Alcohol use: No   Drug use: No   Sexual activity: Not Currently  Other Topics Concern   Not  on file  Social History Narrative   Pt and her husband are Active members of [employer] and have support from this community. No Children.    Social Determinants of Health   Financial Resource Strain: Low Risk  (01/03/2023)   Overall Financial Resource Strain (CARDIA)    Difficulty of Paying Living Expenses: Not hard at all  Food Insecurity: No Food Insecurity (01/03/2023)   Hunger Vital Sign    Worried About Running Out of Food in the Last Year: Never true    Ran Out of Food in the Last Year: Never true  Transportation Needs: No Transportation Needs (01/03/2023)   PRAPARE - Administrator, Civil Service (Medical): No    Lack of Transportation (Non-Medical): No  Physical Activity: Insufficiently Active (01/03/2023)   Exercise Vital Sign    Days of Exercise per Week: 4 days    Minutes of Exercise per Session: 30 min  Stress: No Stress Concern Present (01/03/2023)   [name] of Occupational Health - Occupational Stress Questionnaire    Feeling of Stress : Not at all  Social Connections: Moderately Isolated (01/03/2023)   Social Connection and Isolation Panel [NHANES]    Frequency of Communication with Friends and Family: More than three times a week    Frequency of Social Gatherings with Friends and Family: More than three times a week    Attends Religious Services: Never    Database administrator or Organizations: No    Attends Engineer, structural:  Never    Marital Status: Married    Tobacco Counseling Counseling given: Not Answered   Clinical Intake:  Pre-visit preparation completed: Yes  Pain : No/denies pain     BMI - recorded: 30.38 Nutritional Status: BMI > 30  Obese Nutritional Risks: None Diabetes: No  How often do you need to have someone help you when you read instructions, pamphlets, or other written materials from your doctor or pharmacy?: 2 - Rarely What is the last grade level you completed in school?: High school  Diabetic?No  Interpreter Needed?: No      Activities of Daily Living    01/03/2023    1:39 PM 11/28/2022    4:09 AM  In your present state of health, do you have any difficulty performing the following activities:  Hearing? 1   Vision? 0   Difficulty concentrating or making decisions? 1   Walking or climbing stairs? 0   Dressing or bathing? 0   Doing errands, shopping? 0 1  Preparing Food and eating ? N   Using the Toilet? N   In the past six months, have you accidently leaked urine? Y   Do you have problems with loss of bowel control? N   Managing your Medications? N   Managing your Finances? Y   Housekeeping or managing your Housekeeping? N     Patient Care Team: Mahlon Gammon, MD as PCP - General (Internal Medicine) Thurmon Fair, MD as PCP - Cardiology (Cardiology) Harriette Bouillon, MD as Consulting Physician (General Surgery) Chipper Herb, MD (Inactive) as Consulting Physician (Radiation Oncology) Pershing Proud, RN as Registered Nurse Donnelly Angelica, RN as Registered Nurse Vida Rigger, MD as Consulting Physician (Gastroenterology) Rachel Moulds, MD as Consulting Physician (Hematology and Oncology) [place], Roe Rutherford, [place] as Physician Assistant (Cardiology)  Indicate any recent Medical Services you may have received from other than [facility] providers in the past year (date may be approximate).     Assessment:  This is a routine wellness examination for  [name].  Hearing/Vision screen Hearing Screening - Comments:: Some hearing concerns.  Vision Screening - Comments:: No vision concerns. Patient last eye exam 2024. Patient wears prescription glasses.   Dietary issues and exercise activities discussed: Current Exercise Habits: The patient does not participate in regular exercise at present, Exercise limited by: cardiac condition(s);neurologic condition(s);orthopedic condition(s)   Goals Addressed             This Visit's Progress    Patient Stated   On track    Maintain current level of health       Depression Screen    01/03/2023    1:22 PM 07/07/2022   10:13 AM 06/02/2022   10:03 AM 10/12/2021    3:01 PM 10/12/2021    2:44 PM 08/12/2020    2:18 PM 10/06/2018    9:11 AM  PHQ 2/9 Scores  PHQ - 2 Score 0 0 0 0 0 0 0    Fall Risk    01/03/2023    1:38 PM 01/03/2023    1:22 PM 11/17/2022   10:09 AM 07/07/2022   10:13 AM 06/02/2022   10:03 AM  Fall Risk   Falls in the past year? 0 0 0 0 0  Number falls in past yr: 0 0 0 0 0  Injury with Fall? 0 0 0 0 0  Risk for fall due to : History of fall(s);Impaired balance/gait;Impaired mobility No Fall Risks No Fall Risks History of fall(s);Impaired balance/gait;Impaired mobility History of fall(s);Impaired balance/gait;Impaired mobility  Follow up Falls evaluation completed;Education provided;Falls prevention discussed Falls evaluation completed Falls evaluation completed Falls evaluation completed Falls evaluation completed    FALL RISK PREVENTION PERTAINING TO THE HOME:  Any stairs in or around the home? No  If so, are there any without handrails? No  Home free of loose throw rugs in walkways, pet beds, electrical cords, etc? Yes  Adequate lighting in your home to reduce risk of falls? Yes   ASSISTIVE DEVICES UTILIZED TO PREVENT FALLS:  Life alert? Yes  Use of a cane, walker or w/c? Yes  Grab bars in the bathroom? Yes  Shower chair or bench in shower? Yes  Elevated toilet  seat or a handicapped toilet? Yes   TIMED UP AND GO:  Was the test performed? No .  Length of time to ambulate 10 feet: Refused sec.   Gait slow and steady with assistive device  Cognitive Function:    01/03/2023    1:24 PM 10/12/2021    2:44 PM  MMSE - Mini Mental State Exam  Orientation to time 4 5  Orientation to Place 2 5  Registration 3 3  Attention/ Calculation 5 5  Recall 0 0  Language- name 2 objects 1 2  Language- repeat 0 1  Language- follow 3 step command 3 3  Language- read & follow direction 0 1  Write a sentence 0 1  Copy design 0 0  Total score 18 26        08/12/2020    2:21 PM  6CIT Screen  What Year? 0 points  What month? 0 points  What time? 0 points  Count back from 20 0 points  Months in reverse 4 points  Repeat phrase 2 points  Total Score 6 points    Immunizations Immunization History  Administered Date(s) Administered   Fluad Quad(high Dose 65+) 05/03/2022   Influenza Split 04/19/2011, 05/01/2012   Influenza Whole 04/18/2006, 05/02/2007, 04/10/2009, 04/14/2010  Influenza, High Dose Seasonal PF 05/03/2013, 06/05/2015, 05/16/2017, 05/02/2019, 04/08/2020, 04/23/2021   Influenza,inj,Quad PF,6+ Mos 04/20/2018   Influenza-Unspecified 05/06/2014, 04/29/2016   Moderna SARS-COV2 Booster Vaccination 12/24/2020, 12/30/2021   Moderna Sars-Covid-2 Vaccination 08/20/2019, 09/17/2019, 06/02/2020   PPD Test 02/08/2011   Pfizer Covid-19 Vaccine Bivalent Booster 2yrs & up 04/08/2021   Pneumococcal Conjugate-13 05/06/2012   Pneumococcal Polysaccharide-23 04/18/2005, 02/08/2011   Td 04/18/2005   Zoster, Live 05/18/2012    TDAP status: Due, Education has been provided regarding the importance of this vaccine. Advised may receive this vaccine at local pharmacy or Health Dept. Aware to provide a copy of the vaccination record if obtained from local pharmacy or Health Dept. Verbalized acceptance and understanding.  Flu Vaccine status: Up to  date  Pneumococcal vaccine status: Up to date  Covid-19 vaccine status: Completed vaccines  Qualifies for Shingles Vaccine? Yes   Zostavax completed Yes   Shingrix Completed?: No.    Education has been provided regarding the importance of this vaccine. Patient has been advised to call insurance company to determine out of pocket expense if they have not yet received this vaccine. Advised may also receive vaccine at local pharmacy or Health Dept. Verbalized acceptance and understanding.  Screening Tests Health Maintenance  Topic Date Due   Zoster Vaccines- Shingrix (1 of 2) Never done   DTaP/Tdap/Td (2 - Tdap) 04/19/2015   COVID-19 Vaccine (5 - 2023-24 season) 03/19/2022   INFLUENZA VACCINE  02/17/2023   Medicare Annual Wellness (AWV)  01/03/2024   Pneumonia Vaccine 80+ Years old  Completed   DEXA SCAN  Completed   HPV VACCINES  Aged Out   MAMMOGRAM  Discontinued    Health Maintenance  Health Maintenance Due  Topic Date Due   Zoster Vaccines- Shingrix (1 of 2) Never done   DTaP/Tdap/Td (2 - Tdap) 04/19/2015   COVID-19 Vaccine (5 - 2023-24 season) 03/19/2022    Colorectal cancer screening: No longer required.   Mammogram status: No longer required due to advanced age.  Bone Density status: Completed does not want bone density at this time. Results reflect: Bone density results: OSTEOPOROSIS. Repeat every 2 years.  Lung Cancer Screening: (Low Dose CT Chest recommended if Age 41-80 years, 30 pack-year currently smoking OR have quit w/in 15years.) does not qualify.   Lung Cancer Screening Referral: No  Additional Screening:  Hepatitis C Screening: does not qualify; Completed   Vision Screening: Recommended annual ophthalmology exams for early detection of glaucoma and other disorders of the eye. Is the patient up to date with their annual eye exam?  Yes  Who is the provider or what is the name of the office in which the patient attends annual eye exams? Dr. Hazle Quant If pt is  not established with a provider, would they like to be referred to a provider to establish care? No .   Dental Screening: Recommended annual dental exams for proper oral hygiene  Community Resource Referral / Chronic Care Management: CRR required this visit?  No   CCM required this visit?  No      Plan:     I have personally reviewed and noted the following in the patient's chart:   Medical and social history Use of alcohol, tobacco or illicit drugs  Current medications and supplements including opioid prescriptions. Patient is not currently taking opioid prescriptions. Functional ability and status Nutritional status Physical activity Advanced directives List of other physicians Hospitalizations, surgeries, and ER visits in previous 12 months Vitals Screenings to include cognitive,  depression, and falls Referrals and appointments  In addition, I have reviewed and discussed with patient certain preventive protocols, quality metrics, and best practice recommendations. A written personalized care plan for preventive services as well as general preventive health recommendations were provided to patient.     Octavia Heir, NP   01/03/2023   Nurse Notes: does not want mammograms anymore due to advanced age. Oncology scheduled them> they have not scheduled. Husband will reach out to oncology to clarify. MMSE 18/30, incorrect clock.

## 2023-01-04 DIAGNOSIS — R278 Other lack of coordination: Secondary | ICD-10-CM | POA: Diagnosis not present

## 2023-01-04 DIAGNOSIS — M6281 Muscle weakness (generalized): Secondary | ICD-10-CM | POA: Diagnosis not present

## 2023-01-04 DIAGNOSIS — R2681 Unsteadiness on feet: Secondary | ICD-10-CM | POA: Diagnosis not present

## 2023-01-06 DIAGNOSIS — M6281 Muscle weakness (generalized): Secondary | ICD-10-CM | POA: Diagnosis not present

## 2023-01-06 DIAGNOSIS — R2681 Unsteadiness on feet: Secondary | ICD-10-CM | POA: Diagnosis not present

## 2023-01-06 DIAGNOSIS — R278 Other lack of coordination: Secondary | ICD-10-CM | POA: Diagnosis not present

## 2023-01-10 ENCOUNTER — Telehealth: Payer: Self-pay | Admitting: Cardiovascular Disease

## 2023-01-10 NOTE — Telephone Encounter (Signed)
Spoke to pt friend Arline Asp not listed on DPR, pt did verbal consent to speak with [place]. Ms. Arline Asp stated she contact the pharmacy to refill pt Kdur 40 meq was advised it's too early to refill. Pt is prescribed 20 meq and was advised during OV on 5/24:   Medication Instructions:  Increase Furosemide to 60mg  a day Increase KDur to 40 meq a day     Pt was doubling the Kdur to equal the dosage increase 40 meq. Ms. Arline Asp will like for the office to send new prescription for the Kdur and Furosemide to the pharmacy on file. Will forward to MD and nurse for advise.

## 2023-01-10 NOTE — Telephone Encounter (Signed)
Pt c/o medication issue:  1. Name of Medication:    potassium chloride SA (KLOR-CON M20) 20 MEQ tablet  furosemide (LASIX) 40 MG tablet   2. How are you currently taking this medication (dosage and times per day)?  As prescribed at last office visit  3. Are you having a reaction (difficulty breathing--STAT)?  No  4. What is your medication issue?   Caller stated patient's insurance will need approval for patient to continue the Klor-Con twice daily.  Caller wants prescription for Klor-Con to have pharmacy cut tablets in half as patient has trouble swallowing.  Caller stated patient's furosemide medication was increased from 40 mg to 60 mg and will also need a new prescription sent to her pharmacy ([id number] - [place], [place] - [address]).

## 2023-01-10 NOTE — Telephone Encounter (Signed)
Please send in the Rx for  - furosemide 40 mg tabs, 1.5 tabs daily - KDur 20 mEq, one tab twice daily #months supply and 3 refills

## 2023-01-11 ENCOUNTER — Other Ambulatory Visit: Payer: Self-pay

## 2023-01-11 DIAGNOSIS — R2681 Unsteadiness on feet: Secondary | ICD-10-CM | POA: Diagnosis not present

## 2023-01-11 DIAGNOSIS — M6281 Muscle weakness (generalized): Secondary | ICD-10-CM | POA: Diagnosis not present

## 2023-01-11 DIAGNOSIS — R278 Other lack of coordination: Secondary | ICD-10-CM | POA: Diagnosis not present

## 2023-01-11 MED ORDER — FUROSEMIDE 40 MG PO TABS: 40.0000 mg | ORAL_TABLET | Freq: Every day | ORAL | 3 refills | Status: DC

## 2023-01-11 MED ORDER — POTASSIUM CHLORIDE CRYS ER 20 MEQ PO TBCR: 20.0000 meq | EXTENDED_RELEASE_TABLET | Freq: Two times a day (BID) | ORAL | 3 refills | Status: DC

## 2023-01-13 DIAGNOSIS — R2681 Unsteadiness on feet: Secondary | ICD-10-CM | POA: Diagnosis not present

## 2023-01-13 DIAGNOSIS — R278 Other lack of coordination: Secondary | ICD-10-CM | POA: Diagnosis not present

## 2023-01-13 DIAGNOSIS — M6281 Muscle weakness (generalized): Secondary | ICD-10-CM | POA: Diagnosis not present

## 2023-01-18 ENCOUNTER — Telehealth: Payer: Self-pay

## 2023-01-18 NOTE — Telephone Encounter (Signed)
   Pre-operative Risk Assessment    Patient Name: [name]  DOB: April 24, 1922 MRN: [id number]     Request for Surgical Clearance    Procedure:  Dental Extraction - Amount of Teeth to be Pulled:  1  Date of Surgery:  Clearance TBD                                 Surgeon:  DR. [facility] Surgeon's Group or Practice Name:  [facility] Phone number:  [phone] Fax number:  [phone]   Type of Clearance Requested:   - Pharmacy:  Hold Apixaban (Eliquis) NEEDS INSTRUCTIONS    Type of Anesthesia:  Not Indicated   Additional requests/questions:    SignedMichaelle Copas   01/18/2023, 5:16 PM

## 2023-01-18 NOTE — Telephone Encounter (Signed)
   Patient Name: [name]  DOB: 03-27-1922 MRN: [id number]  Primary Cardiologist: Thurmon Fair, MD  Chart reviewed as part of pre-operative protocol coverage.   Simple dental extractions (i.e. 1-2 teeth) are considered low risk procedures per guidelines and generally do not require any specific cardiac clearance. It is also generally accepted that for simple extractions and dental cleanings, there is no need to interrupt blood thinner therapy.  SBE prophylaxis is not required for the patient from a cardiac standpoint.  I will route this recommendation to the requesting party via Epic fax function and remove from pre-op pool.  Please call with questions.  Napoleon Form, Leodis Rains, NP 01/18/2023, 5:19 PM

## 2023-01-21 DIAGNOSIS — M6281 Muscle weakness (generalized): Secondary | ICD-10-CM | POA: Diagnosis not present

## 2023-01-21 DIAGNOSIS — R2681 Unsteadiness on feet: Secondary | ICD-10-CM | POA: Diagnosis not present

## 2023-01-21 DIAGNOSIS — Z9181 History of falling: Secondary | ICD-10-CM | POA: Diagnosis not present

## 2023-01-21 DIAGNOSIS — R278 Other lack of coordination: Secondary | ICD-10-CM | POA: Diagnosis not present

## 2023-01-27 ENCOUNTER — Telehealth: Payer: Self-pay | Admitting: Cardiovascular Disease

## 2023-01-27 NOTE — Telephone Encounter (Signed)
Patient daughter states patient was told to take OTC Ibuprofen.  She did a first dose after her procedure then realized she probably should not be taking this.  Advised we prefer tylenol.  She will start giving her tylenol for pain starting today.  Advised bleeding precautions per protocol. Nothing further needed at this time

## 2023-01-27 NOTE — Telephone Encounter (Signed)
Pt c/o medication issue:  1. Name of Medication:   Ibuprofen, 800 mg  2. How are you currently taking this medication (dosage and times per day)?   Only 1 dose taken  3. Are you having a reaction (difficulty breathing--STAT)?   4. What is your medication issue?   Caller is concerned patient was prescribed this medication after having teeth extracted and wants to know if this will affect patient taking Eliquis. Caller stated patient only took 1 dose of this medication.

## 2023-02-01 DIAGNOSIS — L245 Irritant contact dermatitis due to other chemical products: Secondary | ICD-10-CM | POA: Diagnosis not present

## 2023-02-01 DIAGNOSIS — D692 Other nonthrombocytopenic purpura: Secondary | ICD-10-CM | POA: Diagnosis not present

## 2023-02-02 DIAGNOSIS — R278 Other lack of coordination: Secondary | ICD-10-CM | POA: Diagnosis not present

## 2023-02-02 DIAGNOSIS — M6281 Muscle weakness (generalized): Secondary | ICD-10-CM | POA: Diagnosis not present

## 2023-02-02 DIAGNOSIS — Z9181 History of falling: Secondary | ICD-10-CM | POA: Diagnosis not present

## 2023-02-02 DIAGNOSIS — R2681 Unsteadiness on feet: Secondary | ICD-10-CM | POA: Diagnosis not present

## 2023-02-04 ENCOUNTER — Telehealth: Payer: Self-pay | Admitting: Emergency Medicine

## 2023-02-04 DIAGNOSIS — M6281 Muscle weakness (generalized): Secondary | ICD-10-CM | POA: Diagnosis not present

## 2023-02-04 DIAGNOSIS — R2681 Unsteadiness on feet: Secondary | ICD-10-CM | POA: Diagnosis not present

## 2023-02-04 DIAGNOSIS — Z9181 History of falling: Secondary | ICD-10-CM | POA: Diagnosis not present

## 2023-02-04 DIAGNOSIS — R278 Other lack of coordination: Secondary | ICD-10-CM | POA: Diagnosis not present

## 2023-02-04 NOTE — Telephone Encounter (Signed)
Pt assistance application for Eliquis faxed

## 2023-02-07 DIAGNOSIS — R278 Other lack of coordination: Secondary | ICD-10-CM | POA: Diagnosis not present

## 2023-02-07 DIAGNOSIS — Z9181 History of falling: Secondary | ICD-10-CM | POA: Diagnosis not present

## 2023-02-07 DIAGNOSIS — R2681 Unsteadiness on feet: Secondary | ICD-10-CM | POA: Diagnosis not present

## 2023-02-07 DIAGNOSIS — M6281 Muscle weakness (generalized): Secondary | ICD-10-CM | POA: Diagnosis not present

## 2023-02-09 DIAGNOSIS — R2681 Unsteadiness on feet: Secondary | ICD-10-CM | POA: Diagnosis not present

## 2023-02-09 DIAGNOSIS — M6281 Muscle weakness (generalized): Secondary | ICD-10-CM | POA: Diagnosis not present

## 2023-02-09 DIAGNOSIS — Z9181 History of falling: Secondary | ICD-10-CM | POA: Diagnosis not present

## 2023-02-09 DIAGNOSIS — R278 Other lack of coordination: Secondary | ICD-10-CM | POA: Diagnosis not present

## 2023-02-10 DIAGNOSIS — R278 Other lack of coordination: Secondary | ICD-10-CM | POA: Diagnosis not present

## 2023-02-10 DIAGNOSIS — Z9181 History of falling: Secondary | ICD-10-CM | POA: Diagnosis not present

## 2023-02-10 DIAGNOSIS — M6281 Muscle weakness (generalized): Secondary | ICD-10-CM | POA: Diagnosis not present

## 2023-02-10 DIAGNOSIS — R2681 Unsteadiness on feet: Secondary | ICD-10-CM | POA: Diagnosis not present

## 2023-02-11 DIAGNOSIS — Z9181 History of falling: Secondary | ICD-10-CM | POA: Diagnosis not present

## 2023-02-11 DIAGNOSIS — R278 Other lack of coordination: Secondary | ICD-10-CM | POA: Diagnosis not present

## 2023-02-11 DIAGNOSIS — M6281 Muscle weakness (generalized): Secondary | ICD-10-CM | POA: Diagnosis not present

## 2023-02-11 DIAGNOSIS — R2681 Unsteadiness on feet: Secondary | ICD-10-CM | POA: Diagnosis not present

## 2023-02-14 DIAGNOSIS — Z9181 History of falling: Secondary | ICD-10-CM | POA: Diagnosis not present

## 2023-02-14 DIAGNOSIS — R2681 Unsteadiness on feet: Secondary | ICD-10-CM | POA: Diagnosis not present

## 2023-02-14 DIAGNOSIS — M6281 Muscle weakness (generalized): Secondary | ICD-10-CM | POA: Diagnosis not present

## 2023-02-14 DIAGNOSIS — R278 Other lack of coordination: Secondary | ICD-10-CM | POA: Diagnosis not present

## 2023-02-15 DIAGNOSIS — R2681 Unsteadiness on feet: Secondary | ICD-10-CM | POA: Diagnosis not present

## 2023-02-15 DIAGNOSIS — Z9181 History of falling: Secondary | ICD-10-CM | POA: Diagnosis not present

## 2023-02-15 DIAGNOSIS — M6281 Muscle weakness (generalized): Secondary | ICD-10-CM | POA: Diagnosis not present

## 2023-02-15 DIAGNOSIS — R278 Other lack of coordination: Secondary | ICD-10-CM | POA: Diagnosis not present

## 2023-02-17 ENCOUNTER — Other Ambulatory Visit: Payer: Self-pay

## 2023-02-17 DIAGNOSIS — M6281 Muscle weakness (generalized): Secondary | ICD-10-CM | POA: Diagnosis not present

## 2023-02-17 DIAGNOSIS — Z9181 History of falling: Secondary | ICD-10-CM | POA: Diagnosis not present

## 2023-02-17 DIAGNOSIS — R278 Other lack of coordination: Secondary | ICD-10-CM | POA: Diagnosis not present

## 2023-02-17 DIAGNOSIS — R2681 Unsteadiness on feet: Secondary | ICD-10-CM | POA: Diagnosis not present

## 2023-02-17 MED ORDER — METOPROLOL SUCCINATE ER 25 MG PO TB24: ORAL_TABLET | ORAL | 2 refills | Status: DC

## 2023-02-21 DIAGNOSIS — R278 Other lack of coordination: Secondary | ICD-10-CM | POA: Diagnosis not present

## 2023-02-21 DIAGNOSIS — M6281 Muscle weakness (generalized): Secondary | ICD-10-CM | POA: Diagnosis not present

## 2023-02-21 DIAGNOSIS — Z9181 History of falling: Secondary | ICD-10-CM | POA: Diagnosis not present

## 2023-02-21 DIAGNOSIS — R2681 Unsteadiness on feet: Secondary | ICD-10-CM | POA: Diagnosis not present

## 2023-02-24 ENCOUNTER — Other Ambulatory Visit: Payer: Self-pay | Admitting: Allergy and Immunology

## 2023-02-24 DIAGNOSIS — R278 Other lack of coordination: Secondary | ICD-10-CM | POA: Diagnosis not present

## 2023-02-24 DIAGNOSIS — Z9181 History of falling: Secondary | ICD-10-CM | POA: Diagnosis not present

## 2023-02-24 DIAGNOSIS — M6281 Muscle weakness (generalized): Secondary | ICD-10-CM | POA: Diagnosis not present

## 2023-02-24 DIAGNOSIS — R2681 Unsteadiness on feet: Secondary | ICD-10-CM | POA: Diagnosis not present

## 2023-02-25 DIAGNOSIS — R278 Other lack of coordination: Secondary | ICD-10-CM | POA: Diagnosis not present

## 2023-02-25 DIAGNOSIS — M6281 Muscle weakness (generalized): Secondary | ICD-10-CM | POA: Diagnosis not present

## 2023-02-25 DIAGNOSIS — R2681 Unsteadiness on feet: Secondary | ICD-10-CM | POA: Diagnosis not present

## 2023-02-25 DIAGNOSIS — Z9181 History of falling: Secondary | ICD-10-CM | POA: Diagnosis not present

## 2023-02-28 ENCOUNTER — Other Ambulatory Visit: Payer: Self-pay | Admitting: Allergy and Immunology

## 2023-02-28 DIAGNOSIS — Z9181 History of falling: Secondary | ICD-10-CM | POA: Diagnosis not present

## 2023-02-28 DIAGNOSIS — R2681 Unsteadiness on feet: Secondary | ICD-10-CM | POA: Diagnosis not present

## 2023-02-28 DIAGNOSIS — R278 Other lack of coordination: Secondary | ICD-10-CM | POA: Diagnosis not present

## 2023-02-28 DIAGNOSIS — M6281 Muscle weakness (generalized): Secondary | ICD-10-CM | POA: Diagnosis not present

## 2023-03-01 DIAGNOSIS — Z9181 History of falling: Secondary | ICD-10-CM | POA: Diagnosis not present

## 2023-03-01 DIAGNOSIS — M6281 Muscle weakness (generalized): Secondary | ICD-10-CM | POA: Diagnosis not present

## 2023-03-01 DIAGNOSIS — R2681 Unsteadiness on feet: Secondary | ICD-10-CM | POA: Diagnosis not present

## 2023-03-01 DIAGNOSIS — R278 Other lack of coordination: Secondary | ICD-10-CM | POA: Diagnosis not present

## 2023-03-02 ENCOUNTER — Non-Acute Institutional Stay: Payer: MEDICARE | Admitting: Internal Medicine

## 2023-03-02 ENCOUNTER — Encounter: Payer: Self-pay | Admitting: Internal Medicine

## 2023-03-02 VITALS — BP 122/66 | HR 76 | Temp 97.7°F | Resp 17 | Ht 61.0 in | Wt 162.7 lb

## 2023-03-02 DIAGNOSIS — I5032 Chronic diastolic (congestive) heart failure: Secondary | ICD-10-CM

## 2023-03-02 DIAGNOSIS — R600 Localized edema: Secondary | ICD-10-CM | POA: Diagnosis not present

## 2023-03-02 DIAGNOSIS — E7800 Pure hypercholesterolemia, unspecified: Secondary | ICD-10-CM

## 2023-03-02 DIAGNOSIS — I480 Paroxysmal atrial fibrillation: Secondary | ICD-10-CM

## 2023-03-02 NOTE — Progress Notes (Signed)
[facility] Service:  Clinic (12)  Provider:   Code Status: DNR Goals of Care:     03/02/2023   10:47 AM  Advanced Directives  Does Patient Have a Medical Advance Directive? Yes  Type of Estate agent of [place];Living will;Out of facility DNR (pink MOST or yellow form)  Does patient want to make changes to medical advance directive? No - Patient declined     Chief Complaint  Patient presents with   Acute Visit    Patient is been seen for some leg tenderness and red calf    HPI: Patient is a 89 y.o. female seen today for an acute visit for Leg Swelling and redness  [place] [facility]   Has h/o Diastolic CHF Recently her Lasix was increased by Cardiology She is working with OT and they noticed some redness and increased swelling in her leg and wanted me to see her Her weight seems stable Patient denied any pain or Fever or discharge from the leg She has been progressively getting weaker and they have hired some help for her  Wt Readings from Last 3 Encounters:  03/02/23 162 lb 11.2 oz (73.8 kg)  01/03/23 160 lb 12.8 oz (72.9 kg)  12/10/22 161 lb (73 kg)   Short-term memory is poor.  Her husband is now managing medicine Appetite is poor has lost some weight but continues to push herself and drinks boost.   Has history of bilateral breast cancer s/p lumpectomy and  restarted back on tamoxifen Mammogram pending  Also has CKD and GERD Past Medical History:  Diagnosis Date   Allergic rhinitis, seasonal    Breast cancer of upper-outer quadrant of right female breast (HCC) 09/13/2014   Chronic venous insufficiency    Dermatophytosis of nail    Dysrhythmia    H/o Atrial fibrillation   Edema leg    Family history of malignant neoplasm of breast    Family history of malignant neoplasm of ovary    Goiter    right thyroid nodule    HTN (hypertension) 08/13/2017   HX: breast cancer     bilateral   Menopausal syndrome    Osteoarthritis    Osteoporosis    Squamous cell skin cancer    right lower leg   Wears glasses    Wears hearing aid    both ears    Past Surgical History:  Procedure Laterality Date   bilateral lumpectomies for breast cancer  Aug. 2007   BREAST LUMPECTOMY WITH RADIOACTIVE SEED LOCALIZATION Right 10/01/2014   Procedure: BREAST LUMPECTOMY WITH RADIOACTIVE SEED LOCALIZATION;  Surgeon: Harriette Bouillon, MD;  Location: [facility];  Service: General;  Laterality: Right;   BREAST LUMPECTOMY WITH RADIOACTIVE SEED LOCALIZATION Left 03/22/2019   Procedure: LEFT BREAST LUMPECTOMY WITH RADIOACTIVE SEED LOCALIZATION;  Surgeon: Harriette Bouillon, MD;  Location: [facility];  Service: General;  Laterality: Left;   BREAST LUMPECTOMY WITH RADIOACTIVE SEED LOCALIZATION Right 08/18/2021   Procedure: RIGHT BREAST LUMPECTOMY WITH RADIOACTIVE SEED LOCALIZATION;  Surgeon: Harriette Bouillon, MD;  Location: [facility];  Service: General;  Laterality: Right;   CATARACT EXTRACTION     CHOLECYSTECTOMY N/A 08/13/2017   Procedure: LAPAROSCOPIC CHOLECYSTECTOMY WITH INTRAOPERATIVE CHOLANGIOGRAM;  Surgeon: Glenna Fellows, MD;  Location: [facility];  Service: General;  Laterality: N/A;   DILATION AND CURETTAGE OF UTERUS     ESOPHAGOGASTRODUODENOSCOPY  07/26/2011   Procedure: ESOPHAGOGASTRODUODENOSCOPY (EGD);  Surgeon: Vernia Buff  Shanon Ace, MD;  Location: [facility];  Service: Endoscopy;  Laterality: N/A;   history of lumbar compression fracture     left eardum sx  1983   left knee arthroscopic surgery     no screening colonoscopy     s/p bilateral lumpectomies     SAVORY DILATION  07/26/2011   Procedure: SAVORY DILATION;  Surgeon: Petra Kuba, MD;  Location: [facility];  Service: Endoscopy;  Laterality: N/A;   status post resection squamous cell cancer right lower leg     TONSILLECTOMY     UMBILICAL HERNIA REPAIR      Allergies  Allergen Reactions   Celecoxib Swelling    Swelling-leg     Outpatient Encounter Medications as of 03/02/2023  Medication Sig   apixaban (ELIQUIS) 5 MG TABS tablet TAKE 1 TABLET BY MOUTH TWICE A DAY   atorvastatin (LIPITOR) 40 MG tablet TAKE 1 TABLET BY MOUTH EVERY DAY   furosemide (LASIX) 40 MG tablet Take 1.5 tablets (60 mg total) by mouth daily.   ipratropium (ATROVENT) 0.06 % nasal spray PLACE 2 SPRAYS INTO BOTH NOSTRILS 4 TIMES A DAY   ketoconazole (NIZORAL) 2 % cream Apply 1 application  topically 2 (two) times daily as needed for irritation.   metoprolol succinate (TOPROL-XL) 25 MG 24 hr tablet TAKE 1 TABLET BY MOUTH EVERYDAY AT BEDTIME   montelukast (SINGULAIR) 10 MG tablet TAKE 1 TABLET BY MOUTH AT NIGHT FOR COUGH OR WHEEZING   Multiple Vitamins-Minerals (PRESERVISION AREDS 2) CAPS Take 1 capsule by mouth 2 (two) times daily.    pantoprazole (PROTONIX) 40 MG tablet TAKE 1 TABLET BY MOUTH EVERY DAY   potassium chloride SA (KLOR-CON M) 20 MEQ tablet Take 1 tablet (20 mEq total) by mouth 2 (two) times daily.   potassium chloride SA (KLOR-CON M20) 20 MEQ tablet Take 2 tablets (40 mEq total) by mouth daily.   tamoxifen (NOLVADEX) 20 MG tablet TAKE 1 TABLET BY MOUTH EVERY DAY   tobramycin (TOBREX) 0.3 % ophthalmic solution 1 drop 4 (four) times daily.   furosemide (LASIX) 40 MG tablet Take 1 tablet (40 mg total) by mouth daily. (Patient not taking: Reported on 03/02/2023)   No facility-administered encounter medications on file as of 03/02/2023.    Review of Systems:  Review of Systems  Constitutional:  Positive for activity change. Negative for appetite change.  HENT: Negative.    Respiratory:  Negative for cough and shortness of breath.   Cardiovascular:  Positive for leg swelling.  Gastrointestinal:  Negative for constipation.  Genitourinary: Negative.   Musculoskeletal:  Positive for gait problem. Negative for arthralgias and myalgias.  Skin: Negative.   Neurological:  Negative for dizziness and weakness.  Psychiatric/Behavioral:   Negative for confusion, dysphoric mood and sleep disturbance.     Health Maintenance  Topic Date Due   Zoster Vaccines- Shingrix (1 of 2) 12/17/1940   COVID-19 Vaccine (5 - 2023-24 season) 03/19/2022   INFLUENZA VACCINE  02/17/2023   Medicare Annual Wellness (AWV)  01/03/2024   Pneumonia Vaccine 53+ Years old  Completed   DEXA SCAN  Completed   HPV VACCINES  Aged Out   DTaP/Tdap/Td  Discontinued   MAMMOGRAM  Discontinued    Physical Exam: Vitals:   03/02/23 1044  BP: 122/66  Pulse: 76  Resp: 17  Temp: (!) 102 F (38.9 C)  TempSrc: Temporal  SpO2: 99%  Weight: 162 lb 11.2 oz (73.8 kg)  Height: 5\' 1"  (1.549 m)   Body mass index  is 30.74 kg/m. Physical Exam Vitals reviewed.  Constitutional:      Appearance: Normal appearance.  HENT:     Head: Normocephalic.     Nose: Nose normal.     Mouth/Throat:     Mouth: Mucous membranes are moist.     Pharynx: Oropharynx is clear.  Eyes:     Pupils: Pupils are equal, round, and reactive to light.  Cardiovascular:     Rate and Rhythm: Normal rate. Rhythm irregular.     Pulses: Normal pulses.     Heart sounds: Normal heart sounds. No murmur heard. Pulmonary:     Effort: Pulmonary effort is normal.     Breath sounds: Normal breath sounds.  Abdominal:     General: Abdomen is flat. Bowel sounds are normal.     Palpations: Abdomen is soft.  Musculoskeletal:        General: Swelling present.     Cervical back: Neck supple.     Comments: Edema seems same  She does have some redness but not tender or warm  Skin:    General: Skin is warm.  Neurological:     General: No focal deficit present.     Mental Status: She is alert and oriented to person, place, and time.  Psychiatric:        Mood and Affect: Mood normal.        Thought Content: Thought content normal.     Labs reviewed: Basic Metabolic Panel: Recent Labs    05/12/22 1532 07/22/22 1546 11/08/22 0807 11/27/22 1456 11/28/22 0657 12/27/22 0803  NA 142   < >  143 141 142  --   K 4.6   < > 3.7 4.0 3.4*  --   CL 104   < > 107 109 110  --   CO2 25   < > 27 22 22   --   GLUCOSE 128*   < > 123* 139* 123*  --   BUN 35   < > 41* 44* 34*  --   CREATININE 1.22*   < > 1.22* 1.18* 1.04*  --   CALCIUM 9.0   < > 8.6 8.6* 8.1*  --   MG 1.8  --   --  1.4* 2.1  --   TSH 3.160  --  6.70*  --   --  6.33*   < > = values in this interval not displayed.   Liver Function Tests: Recent Labs    07/22/22 1546 11/08/22 0807 11/27/22 1456  AST 16 15 21   ALT 13 13 15   ALKPHOS 74  --  64  BILITOT 0.4 0.5 0.5  PROT 6.1* 6.1 5.7*  ALBUMIN 3.8  --  3.0*   Recent Labs    11/27/22 1456  LIPASE 29   No results for input(s): "AMMONIA" in the last 8760 hours. CBC: Recent Labs    11/08/22 0807 11/27/22 1456 11/28/22 0657  WBC 5.2 6.7 4.3  NEUTROABS 3,765  --   --   HGB 11.5* 10.6* 10.1*  HCT 35.9 35.5* 32.0*  MCV 86.1 90.3 86.3  PLT 200 165 131*   Lipid Panel: Recent Labs    11/08/22 0807  CHOL 124  HDL 47*  LDLCALC 56  TRIG 621  CHOLHDL 2.6   Lab Results  Component Value Date   HGBA1C 5.8 (H) 01/01/2021    Procedures since last visit: No results found.  Assessment/Plan 1. Paroxysmal atrial fibrillation (HCC) On Eliquis and Toprol  2. Bilateral leg edema Seems  controlled Would not change the dose which was increased in 5/24 Will Continue to follow for now Redness seemed more chronic changes  3. Chronic diastolic heart failure (HCC) Echo done in the hospital showed EF of 60%  4. Hypercholesterolemia On statin    Labs/tests ordered:   Next appt:  03/30/2023

## 2023-03-03 DIAGNOSIS — R278 Other lack of coordination: Secondary | ICD-10-CM | POA: Diagnosis not present

## 2023-03-03 DIAGNOSIS — M6281 Muscle weakness (generalized): Secondary | ICD-10-CM | POA: Diagnosis not present

## 2023-03-03 DIAGNOSIS — R2681 Unsteadiness on feet: Secondary | ICD-10-CM | POA: Diagnosis not present

## 2023-03-03 DIAGNOSIS — Z9181 History of falling: Secondary | ICD-10-CM | POA: Diagnosis not present

## 2023-03-07 DIAGNOSIS — R2681 Unsteadiness on feet: Secondary | ICD-10-CM | POA: Diagnosis not present

## 2023-03-07 DIAGNOSIS — R278 Other lack of coordination: Secondary | ICD-10-CM | POA: Diagnosis not present

## 2023-03-07 DIAGNOSIS — M6281 Muscle weakness (generalized): Secondary | ICD-10-CM | POA: Diagnosis not present

## 2023-03-07 DIAGNOSIS — Z9181 History of falling: Secondary | ICD-10-CM | POA: Diagnosis not present

## 2023-03-08 DIAGNOSIS — M6281 Muscle weakness (generalized): Secondary | ICD-10-CM | POA: Diagnosis not present

## 2023-03-08 DIAGNOSIS — Z9181 History of falling: Secondary | ICD-10-CM | POA: Diagnosis not present

## 2023-03-08 DIAGNOSIS — R278 Other lack of coordination: Secondary | ICD-10-CM | POA: Diagnosis not present

## 2023-03-08 DIAGNOSIS — R2681 Unsteadiness on feet: Secondary | ICD-10-CM | POA: Diagnosis not present

## 2023-03-10 ENCOUNTER — Emergency Department (HOSPITAL_COMMUNITY): Payer: MEDICARE

## 2023-03-10 ENCOUNTER — Other Ambulatory Visit: Payer: Self-pay

## 2023-03-10 ENCOUNTER — Emergency Department (HOSPITAL_COMMUNITY)
Admission: EM | Admit: 2023-03-10 | Discharge: 2023-03-10 | Disposition: A | Discharge status: Home / Self Care | Payer: MEDICARE

## 2023-03-10 ENCOUNTER — Encounter (HOSPITAL_COMMUNITY): Payer: Self-pay

## 2023-03-10 DIAGNOSIS — M6281 Muscle weakness (generalized): Secondary | ICD-10-CM | POA: Diagnosis not present

## 2023-03-10 DIAGNOSIS — I129 Hypertensive chronic kidney disease with stage 1 through stage 4 chronic kidney disease, or unspecified chronic kidney disease: Secondary | ICD-10-CM | POA: Insufficient documentation

## 2023-03-10 DIAGNOSIS — R079 Chest pain, unspecified: Secondary | ICD-10-CM

## 2023-03-10 DIAGNOSIS — Z9181 History of falling: Secondary | ICD-10-CM | POA: Diagnosis not present

## 2023-03-10 DIAGNOSIS — Z7901 Long term (current) use of anticoagulants: Secondary | ICD-10-CM | POA: Insufficient documentation

## 2023-03-10 DIAGNOSIS — I959 Hypotension, unspecified: Secondary | ICD-10-CM | POA: Diagnosis not present

## 2023-03-10 DIAGNOSIS — N183 Chronic kidney disease, stage 3 unspecified: Secondary | ICD-10-CM | POA: Insufficient documentation

## 2023-03-10 DIAGNOSIS — I499 Cardiac arrhythmia, unspecified: Secondary | ICD-10-CM | POA: Diagnosis not present

## 2023-03-10 DIAGNOSIS — R0789 Other chest pain: Secondary | ICD-10-CM | POA: Insufficient documentation

## 2023-03-10 DIAGNOSIS — Z853 Personal history of malignant neoplasm of breast: Secondary | ICD-10-CM | POA: Diagnosis not present

## 2023-03-10 DIAGNOSIS — R278 Other lack of coordination: Secondary | ICD-10-CM | POA: Diagnosis not present

## 2023-03-10 DIAGNOSIS — I4891 Unspecified atrial fibrillation: Secondary | ICD-10-CM | POA: Diagnosis not present

## 2023-03-10 DIAGNOSIS — R002 Palpitations: Secondary | ICD-10-CM | POA: Insufficient documentation

## 2023-03-10 DIAGNOSIS — R2681 Unsteadiness on feet: Secondary | ICD-10-CM | POA: Diagnosis not present

## 2023-03-10 DIAGNOSIS — R000 Tachycardia, unspecified: Secondary | ICD-10-CM | POA: Diagnosis not present

## 2023-03-10 LAB — COMPREHENSIVE METABOLIC PANEL
ALT: 11 U/L (ref 0–44)
AST: 19 U/L (ref 15–41)
Albumin: 2.9 g/dL — ABNORMAL LOW (ref 3.5–5.0)
Alkaline Phosphatase: 71 U/L (ref 38–126)
Anion gap: 9 (ref 5–15)
BUN: 42 mg/dL — ABNORMAL HIGH (ref 8–23)
CO2: 20 mmol/L — ABNORMAL LOW (ref 22–32)
Calcium: 8 mg/dL — ABNORMAL LOW (ref 8.9–10.3)
Chloride: 112 mmol/L — ABNORMAL HIGH (ref 98–111)
Creatinine, Ser: 1.1 mg/dL — ABNORMAL HIGH (ref 0.44–1.00)
GFR, Estimated: 45 mL/min — ABNORMAL LOW (ref >60)
Glucose, Bld: 125 mg/dL — ABNORMAL HIGH (ref 70–99)
Potassium: 4 mmol/L (ref 3.5–5.1)
Sodium: 141 mmol/L (ref 135–145)
Total Bilirubin: 0.2 mg/dL — ABNORMAL LOW (ref 0.3–1.2)
Total Protein: 5.6 g/dL — ABNORMAL LOW (ref 6.5–8.1)

## 2023-03-10 LAB — CBC
HCT: 33.1 % — ABNORMAL LOW (ref 36.0–46.0)
Hemoglobin: 9.9 g/dL — ABNORMAL LOW (ref 12.0–15.0)
MCH: 26 pg (ref 26.0–34.0)
MCHC: 29.9 g/dL — ABNORMAL LOW (ref 30.0–36.0)
MCV: 86.9 fL (ref 80.0–100.0)
Platelets: 162 10*3/uL (ref 150–400)
RBC: 3.81 MIL/uL — ABNORMAL LOW (ref 3.87–5.11)
RDW: 15.6 % — ABNORMAL HIGH (ref 11.5–15.5)
WBC: 6.2 10*3/uL (ref 4.0–10.5)
nRBC: 0 % (ref 0.0–0.2)

## 2023-03-10 LAB — TROPONIN I (HIGH SENSITIVITY)
Troponin I (High Sensitivity): 14 ng/L (ref <18)
Troponin I (High Sensitivity): 31 ng/L — ABNORMAL HIGH (ref <18)

## 2023-03-10 LAB — MAGNESIUM: Magnesium: 1.5 mg/dL — ABNORMAL LOW (ref 1.7–2.4)

## 2023-03-10 MED ORDER — MAGNESIUM OXIDE -MG SUPPLEMENT 400 (240 MG) MG PO TABS
400.0000 mg | ORAL_TABLET | Freq: Once | ORAL | Status: AC
  Administered 2023-03-10: 400 mg via ORAL
  Filled 2023-03-10: qty 1

## 2023-03-10 NOTE — ED Provider Notes (Signed)
5:02 PM Care assumed from Dr. Maple Hudson.  At time of transfer of care, patient is waiting for delta troponin and shared decision-making conversation to determine if we need to touch base with cardiology given the patient's near syncope and heart rate in the 190s reportedly.  Anticipate reassessment after labs and workup.  8:13 PM Magnesium slightly decreased, we gave a magnesium pill.  Troponin initially normal, second 1 had increased to the 30s.  We had a shared decision-making conversation offering to consult cardiology however patient want to go home.  She was monitored for over 5-1/2 hours without recurrent symptoms or significant tachycardia.  She does not want to wait for [place] to call cardiology and will call her PCP in the morning.  She think she may have went into a brief episode of A-fib causing her symptoms.  I agree.  She had no other questions or concerns and was discharged in good condition with understanding return precautions and follow-up instructions.   Clinical Impression: 1. Heart palpitations   2. Chest pain, unspecified type   3. Hypomagnesemia     Disposition: Discharge  Condition: Good  I have discussed the results, Dx and Tx plan with the pt(& family if present). He/she/they expressed understanding and agree(s) with the plan. Discharge instructions discussed at great length. Strict return precautions discussed and pt &/or family have verbalized understanding of the instructions. No further questions at time of discharge.    New Prescriptions   No medications on file    Follow Up: Mahlon Gammon, MD [address] [place] [address] [phone]     [facility] Emergency Department at [facility] [address] [place] [address] [phone]       Tyrica Afzal, Canary Brim, MD 03/10/23 2014

## 2023-03-10 NOTE — Discharge Instructions (Signed)
Your history, exam, workup today did not reveal persistent abnormal arrhythmia nor did we see any concerning findings on your monitoring for over 5 and half hours in the emergency department.  Your labs did show a slightly decreased magnesium for which we gave you magnesium pill.  Your heart enzymes did bump up slightly as we discussed but it was still similar to what it has been for you in the past.  We had a shared decision-making conversation and agreed to allow you to be discharged and follow-up with your primary team and rest and stay hydrated.  If any symptoms return, change, or worsen, please return to the nearest emergency department.

## 2023-03-10 NOTE — ED Triage Notes (Signed)
[facility], walking back from dining hall when started, was able to get back to room and call for help, EMS reported pt being pale and weak, pt received 250 NS bolus, HR initially 198, on arrival 98, A&Ox4

## 2023-03-10 NOTE — ED Provider Notes (Signed)
[facility] EMERGENCY DEPARTMENT AT [facility] Provider Note   CSN: [id number] Arrival date & time: 03/10/23  1422     History  Chief Complaint  Patient presents with   Palpitations    [name] is a 89 y.o. female.  This is a 89 year old female presenting the emergency department for palpitations.  Reportedly was walking back from the cafeteria after she had just eaten, began to feel like her heart was racing and had some lightheadedness.  Also endorsed some chest discomfort no shortness of breath.  She did not pass out no LOC.  Patient states that she is feeling fine currently.   Palpitations      Home Medications Prior to Admission medications   Medication Sig Start Date End Date Taking? Authorizing Provider  apixaban (ELIQUIS) 5 MG TABS tablet TAKE 1 TABLET BY MOUTH TWICE A DAY 12/14/22   Croitoru, Mihai, MD  atorvastatin (LIPITOR) 40 MG tablet TAKE 1 TABLET BY MOUTH EVERY DAY 11/08/22   Mahlon Gammon, MD  furosemide (LASIX) 40 MG tablet Take 1.5 tablets (60 mg total) by mouth daily. 12/10/22   Croitoru, Mihai, MD  ipratropium (ATROVENT) 0.06 % nasal spray PLACE 2 SPRAYS INTO BOTH NOSTRILS 4 TIMES A DAY 12/07/21   Kozlow, Alvira Philips, MD  ketoconazole (NIZORAL) 2 % cream Apply 1 application  topically 2 (two) times daily as needed for irritation.    [provider]  metoprolol succinate (TOPROL-XL) 25 MG 24 hr tablet TAKE 1 TABLET BY MOUTH EVERYDAY AT BEDTIME 02/17/23   Croitoru, Mihai, MD  montelukast (SINGULAIR) 10 MG tablet TAKE 1 TABLET BY MOUTH AT NIGHT FOR COUGH OR WHEEZING 08/16/22   Kozlow, Alvira Philips, MD  Multiple Vitamins-Minerals (PRESERVISION AREDS 2) CAPS Take 1 capsule by mouth 2 (two) times daily.     [provider]  pantoprazole (PROTONIX) 40 MG tablet TAKE 1 TABLET BY MOUTH EVERY DAY 11/01/22   Mahlon Gammon, MD  potassium chloride SA (KLOR-CON M) 20 MEQ tablet Take 1 tablet (20 mEq total) by mouth 2 (two) times daily. 01/11/23   Croitoru,  Mihai, MD  tamoxifen (NOLVADEX) 20 MG tablet TAKE 1 TABLET BY MOUTH EVERY DAY 08/05/22   Rachel Moulds, MD  tobramycin (TOBREX) 0.3 % ophthalmic solution 1 drop 4 (four) times daily. 10/04/22   [provider]      Allergies    Celecoxib    Review of Systems   Review of Systems  Cardiovascular:  Positive for palpitations.    Physical Exam Updated Vital Signs BP (!) 123/51   Pulse (!) 101   Temp 98.1 F (36.7 C) (Oral)   Resp 17   Ht 5\' 1"  (1.549 m)   Wt 73.8 kg   SpO2 97%   BMI 30.74 kg/m  Physical Exam Vitals and nursing note reviewed.  Constitutional:      General: She is not in acute distress.    Appearance: She is not toxic-appearing.  HENT:     Head: Normocephalic.     Nose: Nose normal.     Mouth/Throat:     Mouth: Mucous membranes are moist.  Eyes:     Conjunctiva/sclera: Conjunctivae normal.  Cardiovascular:     Rate and Rhythm: Normal rate and regular rhythm.     Pulses: Normal pulses.  Pulmonary:     Effort: Pulmonary effort is normal.     Breath sounds: Normal breath sounds.  Abdominal:     General: Abdomen is flat. There is  no distension.     Tenderness: There is no abdominal tenderness. There is no guarding or rebound.  Musculoskeletal:     Right lower leg: No edema.     Left lower leg: No edema.  Skin:    General: Skin is warm.     Capillary Refill: Capillary refill takes less than 2 seconds.  Neurological:     Mental Status: She is alert and oriented to person, place, and time.  Psychiatric:        Mood and Affect: Mood normal.        Behavior: Behavior normal.     ED Results / Procedures / Treatments   Labs (all labs ordered are listed, but only abnormal results are displayed) Labs Reviewed  CBC - Abnormal; Notable for the following components:      Result Value   RBC 3.81 (*)    Hemoglobin 9.9 (*)    HCT 33.1 (*)    MCHC 29.9 (*)    RDW 15.6 (*)    All other components within normal limits  COMPREHENSIVE METABOLIC PANEL   MAGNESIUM  TROPONIN I (HIGH SENSITIVITY)  TROPONIN I (HIGH SENSITIVITY)    EKG EKG Interpretation Date/Time:  Thursday March 10 2023 14:42:09 EDT Ventricular Rate:  96 PR Interval:  164 QRS Duration:  106 QT Interval:  372 QTC Calculation: 471 R Axis:   -43  Text Interpretation: Sinus rhythm LVH with secondary repolarization abnormality Anterior Q waves, possibly due to LVH Confirmed by Estanislado Pandy [phone]) on 03/10/2023 3:58:39 PM  Radiology DG Chest Portable 1 View  Result Date: 03/10/2023 CLINICAL DATA:  Chest pain EXAM: PORTABLE CHEST 1 VIEW COMPARISON:  X-ray 11/27/2022 FINDINGS: Underinflation. Borderline cardiopericardial silhouette. No consolidation, pneumothorax or effusion. No edema. Interstitial changes. Overlapping cardiac leads. Curvature and degenerative changes along the spine. Artifact from the patient's clothing. IMPRESSION: Underinflation with bronchovascular crowding. Borderline size heart. Diffuse interstitial changes, likely chronic. Electronically Signed   By: Karen Kays M.D.   On: 03/10/2023 15:15    Procedures Procedures    Medications Ordered in ED Medications - No data to display  ED Course/ Medical Decision Making/ A&P Clinical Course as of 03/10/23 1632  Thu Mar 10, 2023  1440 PMH per chart review: "history of paroxysmal atrial fibrillation, complicated by non-STEMI and transient congestive heart failure with preserved LVEF in the setting of cholecystectomy in January 2019.  She had COVID-19 pneumonia with hypoxemia and required brief hospitalization in late 2020, recovered well from this.  Additional medical problems include a history of breast cancer, GERD, hypertension, CKD stage III." [TY]  1441 H/o of afib per cardiology note in may: "AFib: I agree with Dr. Morrie Sheldon assessment when he consulted during her hospital stay, that she most likely had an arrhythmia such as atrial fibrillation, rather than a new acute ischemic event.  We know that she  probably has a high-grade stenosis or occlusion in the LAD artery to explain her apical abnormality.  It's quite likely she would develop some degree of ischemia or injury in the residual viable tissue in that territory, particularly if she develops a tachyarrhythmia.  She is on chronic anticoagulation and takes a beta-blocker.   CHA2DS2-VASc score 5-6 (age 89, gender,  CHF, +/-suspected CAD, hypertension)." [TY]  1558 DG Chest Portable 1 View IMPRESSION: Underinflation with bronchovascular crowding. Borderline size heart. Diffuse interstitial changes, likely chronic.   [TY]    Clinical Course User Index [TY] Coral Spikes, DO  Medical Decision Making Is a 88 year old female presenting emergency department for palpitations.  Afebrile here, heart rate in the 90s.  Hemodynamically stable.  Physical exam reassuring with equal pulses.  No neurologic deficits, not having active chest pain.  EMS reported patient's heart rate initially in the 190s but they appear to be A-fib RVR, but they were unable to get full bleed on her.  Reports that they gave to 250 mL of IV fluids with improvement of heart rate.  Patient appears to be sinus rhythm on EKG today with no ST segment changes indicate ischemia.  Chest x-ray with no acute findings.  She has no fever tachycardia or leukocytosis to suggest systemic infection.  Stable chronic anemia.  Initial troponin is pending.  Care signed out to afternoon team disposition pending troponin.    Amount and/or Complexity of Data Reviewed External Data Reviewed: notes.    Details: See ED course Labs: ordered. Decision-making details documented in ED Course. Radiology: ordered. Decision-making details documented in ED Course. ECG/medicine tests: ordered.           Final Clinical Impression(s) / ED Diagnoses Final diagnoses:  None    Rx / DC Orders ED Discharge Orders     None         Coral Spikes, DO 03/10/23  1632

## 2023-03-11 ENCOUNTER — Telehealth: Payer: Self-pay

## 2023-03-11 NOTE — Telephone Encounter (Signed)
Patient's husband called that he was told to inform Dr. Einar Crow of patient's visit to the hospital. Patient was transported to hospital by EMS due to patient having palpitations. Patient is scheduled to see Dr. Einar Crow 03/30/23.  Message sent to Dr. Einar Crow

## 2023-03-14 ENCOUNTER — Telehealth: Payer: Self-pay

## 2023-03-14 DIAGNOSIS — R2681 Unsteadiness on feet: Secondary | ICD-10-CM | POA: Diagnosis not present

## 2023-03-14 DIAGNOSIS — Z9181 History of falling: Secondary | ICD-10-CM | POA: Diagnosis not present

## 2023-03-14 DIAGNOSIS — M6281 Muscle weakness (generalized): Secondary | ICD-10-CM | POA: Diagnosis not present

## 2023-03-14 DIAGNOSIS — R278 Other lack of coordination: Secondary | ICD-10-CM | POA: Diagnosis not present

## 2023-03-14 MED ORDER — MAGNESIUM OXIDE 250 MG PO TABS: 250.0000 mg | ORAL_TABLET | Freq: Every day | ORAL | Status: AC

## 2023-03-14 NOTE — Telephone Encounter (Signed)
[name] called regarding patient's recent trip to the ED. She wanted to know if patient needs to be seen sooner than 9/11 and since patient's magnesium level was low and seems to be low when experiencing these episodes if this is something that nees to be addressed as far as maybe needing a supplement.  Message sent to Dr. Einar Crow.

## 2023-03-14 NOTE — Addendum Note (Signed)
Addended by: Elveria Royals on: 03/14/2023 11:08 AM   Modules accepted: Orders

## 2023-03-14 NOTE — Telephone Encounter (Signed)
She is fine to be seen on 09/11. She is seeing Cardiology before that and that is more important  Lets start her on Magnesium Oxide 250 OTC every day. If she gets Diarrhea then she has to stop it.

## 2023-03-14 NOTE — Telephone Encounter (Signed)
Spoke with [name] and she verbalized her understanding. Medication list has been updated to reflect Magnesium.

## 2023-03-15 ENCOUNTER — Other Ambulatory Visit: Payer: Self-pay | Admitting: Allergy and Immunology

## 2023-03-15 ENCOUNTER — Telehealth: Payer: Self-pay | Admitting: Allergy and Immunology

## 2023-03-15 NOTE — Telephone Encounter (Signed)
Pt is requesting refill for montelukast- [facility] [address]

## 2023-03-15 NOTE — Telephone Encounter (Signed)
Refill was denied due to patient needing an OV.

## 2023-03-15 NOTE — Telephone Encounter (Signed)
Pt has not been seen in 3 years but states she is 78 and she is well, she states her pcp can be called if needed.

## 2023-03-17 DIAGNOSIS — Z9181 History of falling: Secondary | ICD-10-CM | POA: Diagnosis not present

## 2023-03-17 DIAGNOSIS — M6281 Muscle weakness (generalized): Secondary | ICD-10-CM | POA: Diagnosis not present

## 2023-03-17 DIAGNOSIS — R2681 Unsteadiness on feet: Secondary | ICD-10-CM | POA: Diagnosis not present

## 2023-03-17 DIAGNOSIS — R278 Other lack of coordination: Secondary | ICD-10-CM | POA: Diagnosis not present

## 2023-03-21 DIAGNOSIS — M6281 Muscle weakness (generalized): Secondary | ICD-10-CM | POA: Diagnosis not present

## 2023-03-21 DIAGNOSIS — R2681 Unsteadiness on feet: Secondary | ICD-10-CM | POA: Diagnosis not present

## 2023-03-21 DIAGNOSIS — R278 Other lack of coordination: Secondary | ICD-10-CM | POA: Diagnosis not present

## 2023-03-21 DIAGNOSIS — Z9181 History of falling: Secondary | ICD-10-CM | POA: Diagnosis not present

## 2023-03-24 ENCOUNTER — Other Ambulatory Visit: Payer: MEDICARE

## 2023-03-24 DIAGNOSIS — M6281 Muscle weakness (generalized): Secondary | ICD-10-CM | POA: Diagnosis not present

## 2023-03-24 DIAGNOSIS — R2681 Unsteadiness on feet: Secondary | ICD-10-CM | POA: Diagnosis not present

## 2023-03-24 DIAGNOSIS — R600 Localized edema: Secondary | ICD-10-CM | POA: Diagnosis not present

## 2023-03-24 DIAGNOSIS — R278 Other lack of coordination: Secondary | ICD-10-CM | POA: Diagnosis not present

## 2023-03-24 DIAGNOSIS — Z9181 History of falling: Secondary | ICD-10-CM | POA: Diagnosis not present

## 2023-03-25 LAB — CBC WITH DIFFERENTIAL/PLATELET
Absolute Monocytes: 470 {cells}/uL (ref 200–950)
Basophils Absolute: 39 {cells}/uL (ref 0–200)
Basophils Relative: 0.7 %
Eosinophils Absolute: 62 {cells}/uL (ref 15–500)
Eosinophils Relative: 1.1 %
HCT: 32.3 % — ABNORMAL LOW (ref 35.0–45.0)
Hemoglobin: 10.2 g/dL — ABNORMAL LOW (ref 11.7–15.5)
Lymphs Abs: 1053 {cells}/uL (ref 850–3900)
MCH: 26.2 pg — ABNORMAL LOW (ref 27.0–33.0)
MCHC: 31.6 g/dL — ABNORMAL LOW (ref 32.0–36.0)
MCV: 82.8 fL (ref 80.0–100.0)
MPV: 11.3 fL (ref 7.5–12.5)
Monocytes Relative: 8.4 %
Neutro Abs: 3976 {cells}/uL (ref 1500–7800)
Neutrophils Relative %: 71 %
Platelets: 213 10*3/uL (ref 140–400)
RBC: 3.9 10*6/uL (ref 3.80–5.10)
RDW: 14.4 % (ref 11.0–15.0)
Total Lymphocyte: 18.8 %
WBC: 5.6 10*3/uL (ref 3.8–10.8)

## 2023-03-25 LAB — COMPLETE METABOLIC PANEL WITH GFR
AG Ratio: 1.6 (calc) (ref 1.0–2.5)
ALT: 12 U/L (ref 6–29)
AST: 16 U/L (ref 10–35)
Albumin: 3.6 g/dL (ref 3.6–5.1)
Alkaline phosphatase (APISO): 83 U/L (ref 37–153)
BUN/Creatinine Ratio: 37 (calc) — ABNORMAL HIGH (ref 6–22)
BUN: 43 mg/dL — ABNORMAL HIGH (ref 7–25)
CO2: 23 mmol/L (ref 20–32)
Calcium: 8.5 mg/dL — ABNORMAL LOW (ref 8.6–10.4)
Chloride: 108 mmol/L (ref 98–110)
Creat: 1.17 mg/dL — ABNORMAL HIGH (ref 0.60–0.95)
Globulin: 2.2 g/dL (ref 1.9–3.7)
Glucose, Bld: 105 mg/dL — ABNORMAL HIGH (ref 65–99)
Potassium: 4.4 mmol/L (ref 3.5–5.3)
Sodium: 143 mmol/L (ref 135–146)
Total Bilirubin: 0.3 mg/dL (ref 0.2–1.2)
Total Protein: 5.8 g/dL — ABNORMAL LOW (ref 6.1–8.1)
eGFR: 42 mL/min/{1.73_m2} — ABNORMAL LOW (ref >=60)

## 2023-03-28 ENCOUNTER — Encounter: Payer: Self-pay | Admitting: Cardiovascular Disease

## 2023-03-28 ENCOUNTER — Ambulatory Visit: Payer: MEDICARE | Attending: Cardiovascular Disease | Admitting: Cardiovascular Disease

## 2023-03-28 ENCOUNTER — Encounter: Payer: MEDICARE | Admitting: Orthopedic Surgery

## 2023-03-28 VITALS — BP 132/52 | HR 73 | Ht 61.0 in | Wt 162.8 lb

## 2023-03-28 DIAGNOSIS — R2681 Unsteadiness on feet: Secondary | ICD-10-CM | POA: Diagnosis not present

## 2023-03-28 DIAGNOSIS — D6869 Other thrombophilia: Secondary | ICD-10-CM | POA: Insufficient documentation

## 2023-03-28 DIAGNOSIS — I493 Ventricular premature depolarization: Secondary | ICD-10-CM | POA: Diagnosis not present

## 2023-03-28 DIAGNOSIS — I25119 Atherosclerotic heart disease of native coronary artery with unspecified angina pectoris: Secondary | ICD-10-CM | POA: Insufficient documentation

## 2023-03-28 DIAGNOSIS — I4891 Unspecified atrial fibrillation: Secondary | ICD-10-CM | POA: Insufficient documentation

## 2023-03-28 DIAGNOSIS — I10 Essential (primary) hypertension: Secondary | ICD-10-CM | POA: Insufficient documentation

## 2023-03-28 DIAGNOSIS — M6281 Muscle weakness (generalized): Secondary | ICD-10-CM | POA: Diagnosis not present

## 2023-03-28 DIAGNOSIS — I5032 Chronic diastolic (congestive) heart failure: Secondary | ICD-10-CM | POA: Diagnosis not present

## 2023-03-28 DIAGNOSIS — R278 Other lack of coordination: Secondary | ICD-10-CM | POA: Diagnosis not present

## 2023-03-28 DIAGNOSIS — I872 Venous insufficiency (chronic) (peripheral): Secondary | ICD-10-CM | POA: Diagnosis not present

## 2023-03-28 DIAGNOSIS — Z9181 History of falling: Secondary | ICD-10-CM | POA: Diagnosis not present

## 2023-03-28 NOTE — Progress Notes (Signed)
Cardiology office note   Date:  03/28/2023   ID:  BETSUA SILLIMAN, DOB [birth year], MRN [id number]  PCP:  Mahlon Gammon, MD  Cardiologist:  Thurmon Fair, MD  Electrophysiologist:  None   Evaluation Performed:  Follow-Up Visit  Chief Complaint: Atrial fibrillation  History of Present Illness:    [name] is a 89 y.o. female with history of paroxysmal atrial fibrillation, complicated by non-STEMI and transient congestive heart failure with preserved LVEF in the setting of cholecystectomy in January 2019.  She had COVID-19 pneumonia with hypoxemia and required brief hospitalization in late 2020, recovered well from this.  Additional medical problems include a history of breast cancer, GERD, hypertension, CKD stage III.  She was in the emergency room 03/10/2023 with rapid palpitations and lightheadedness, mild chest discomfort but no dyspnea or syncope.  ECG from EMS showed sinus rhythm with PVCs she improved during transport and while in the emergency room she felt fine and on telemetry did not have any atrial fibrillation.  ECG was otherwise unremarkable, troponin was normal, chest x-ray was normal, labs were significant for a low magnesium level, which is also the pattern at a previous evaluation in May when she had similar complaints.  She takes chronic PPI (pantoprazole) as well as loop diuretics (furosemide).  The echocardiogram performed May 2024 showed an identical pattern with the previous echo with an area of apical akinesis, but overall preserved left ventricular systolic function.  Again noted was "pseudonormal mitral inflow", elevated mean left atrial pressure.  She is feeling well today.  Edema is persistent but generally controlled with compression stockings.  Does not really do a good job of keeping her legs elevated during the day.  She does not have orthopnea, PND or lower extremity edema.  She has not had any falls or bleeding problems and continues to take  Eliquis.  Past Medical History:  Diagnosis Date   Allergic rhinitis, seasonal    Breast cancer of upper-outer quadrant of right female breast (HCC) 09/13/2014   Chronic venous insufficiency    Dermatophytosis of nail    Dysrhythmia    H/o Atrial fibrillation   Edema leg    Family history of malignant neoplasm of breast    Family history of malignant neoplasm of ovary    Goiter    right thyroid nodule    HTN (hypertension) 08/13/2017   HX: breast cancer    bilateral   Menopausal syndrome    Osteoarthritis    Osteoporosis    Squamous cell skin cancer    right lower leg   Wears glasses    Wears hearing aid    both ears   Past Surgical History:  Procedure Laterality Date   bilateral lumpectomies for breast cancer  Aug. 2007   BREAST LUMPECTOMY WITH RADIOACTIVE SEED LOCALIZATION Right 10/01/2014   Procedure: BREAST LUMPECTOMY WITH RADIOACTIVE SEED LOCALIZATION;  Surgeon: Harriette Bouillon, MD;  Location: [facility];  Service: General;  Laterality: Right;   BREAST LUMPECTOMY WITH RADIOACTIVE SEED LOCALIZATION Left 03/22/2019   Procedure: LEFT BREAST LUMPECTOMY WITH RADIOACTIVE SEED LOCALIZATION;  Surgeon: Harriette Bouillon, MD;  Location: [facility];  Service: General;  Laterality: Left;   BREAST LUMPECTOMY WITH RADIOACTIVE SEED LOCALIZATION Right 08/18/2021   Procedure: RIGHT BREAST LUMPECTOMY WITH RADIOACTIVE SEED LOCALIZATION;  Surgeon: Harriette Bouillon, MD;  Location: [facility];  Service: General;  Laterality: Right;   CATARACT EXTRACTION     CHOLECYSTECTOMY N/A 08/13/2017   Procedure: LAPAROSCOPIC CHOLECYSTECTOMY WITH INTRAOPERATIVE  CHOLANGIOGRAM;  Surgeon: Glenna Fellows, MD;  Location: [facility];  Service: General;  Laterality: N/A;   DILATION AND CURETTAGE OF UTERUS     ESOPHAGOGASTRODUODENOSCOPY  07/26/2011   Procedure: ESOPHAGOGASTRODUODENOSCOPY (EGD);  Surgeon: Petra Kuba, MD;  Location: [facility];  Service: Endoscopy;  Laterality: N/A;   history of lumbar compression  fracture     left eardum sx  1983   left knee arthroscopic surgery     no screening colonoscopy     s/p bilateral lumpectomies     SAVORY DILATION  07/26/2011   Procedure: SAVORY DILATION;  Surgeon: Petra Kuba, MD;  Location: [facility];  Service: Endoscopy;  Laterality: N/A;   status post resection squamous cell cancer right lower leg     TONSILLECTOMY     UMBILICAL HERNIA REPAIR       Current Meds  Medication Sig   apixaban (ELIQUIS) 5 MG TABS tablet TAKE 1 TABLET BY MOUTH TWICE A DAY   atorvastatin (LIPITOR) 40 MG tablet TAKE 1 TABLET BY MOUTH EVERY DAY   furosemide (LASIX) 40 MG tablet Take 1.5 tablets (60 mg total) by mouth daily.   ipratropium (ATROVENT) 0.06 % nasal spray PLACE 2 SPRAYS INTO BOTH NOSTRILS 4 TIMES A DAY   Magnesium Oxide 250 MG TABS Take 1 tablet (250 mg total) by mouth daily.   metoprolol succinate (TOPROL-XL) 25 MG 24 hr tablet TAKE 1 TABLET BY MOUTH EVERYDAY AT BEDTIME   montelukast (SINGULAIR) 10 MG tablet TAKE 1 TABLET BY MOUTH AT NIGHT FOR COUGH OR WHEEZING   Multiple Vitamins-Minerals (PRESERVISION AREDS 2) CAPS Take 1 capsule by mouth 2 (two) times daily.    pantoprazole (PROTONIX) 40 MG tablet TAKE 1 TABLET BY MOUTH EVERY DAY   potassium chloride SA (KLOR-CON M) 20 MEQ tablet Take 1 tablet (20 mEq total) by mouth 2 (two) times daily.   tamoxifen (NOLVADEX) 20 MG tablet TAKE 1 TABLET BY MOUTH EVERY DAY     Allergies:   Celecoxib   Social History   Tobacco Use   Smoking status: Never   Smokeless tobacco: Never  Vaping Use   Vaping status: Never Used  Substance Use Topics   Alcohol use: No   Drug use: No     Family Hx: The patient's family history includes Cancer in an other family member; Cancer (age of onset: 19) in her maternal aunt; Cancer (age of onset: 47) in her maternal aunt; Cancer (age of onset: 36) in her mother; Coronary artery disease in an other family member; Heart attack in her brother, brother, and father; Heart disease in her  brother.  ROS:   Please see the history of present illness.   All other systems are reviewed and are negative.   Prior CV studies:   The following studies were reviewed today:  Echocardiogram 11/28/2022   1. Left ventricular ejection fraction, by estimation, is 60 to 65%. The  left ventricle has normal function. The left ventricle has no regional  wall motion abnormalities. There is mild concentric left ventricular  hypertrophy. Left ventricular diastolic  parameters are consistent with Grade II diastolic dysfunction  (pseudonormalization).   2. Right ventricular systolic function is normal. The right ventricular  size is normal. There is mildly elevated pulmonary artery systolic  pressure.   3. Left atrial size was severely dilated.   4. Mild mitral valve regurgitation. Severe mitral annular calcification.   5. The aortic valve is tricuspid. There is mild calcification of the  aortic valve.  Aortic valve regurgitation is not visualized.   6. The inferior vena cava is normal in size with greater than 50%  respiratory variability, suggesting right atrial pressure of 3 mmHg.   Labs/Other Tests and Data Reviewed:    EKG: Ordered today shows normal sinus rhythm with a single PAC, borderline criteria for LVH with nonspecific repolarization changes.  ECGs from EMS at the time of ER evaluation showed rare PVCs.  Recent Labs: 11/27/2022: B Natriuretic Peptide 260.3 12/27/2022: TSH 6.33 03/10/2023: Magnesium 1.5 03/24/2023: ALT 12; BUN 43; Creat 1.17; Hemoglobin 10.2; Platelets 213; Potassium 4.4; Sodium 143   Recent Lipid Panel Lab Results  Component Value Date/Time   CHOL 124 11/08/2022 08:07 AM   CHOL 109 09/19/2017 12:00 AM   TRIG 128 11/08/2022 08:07 AM   TRIG 107 09/19/2017 12:00 AM   HDL 47 (L) 11/08/2022 08:07 AM   CHOLHDL 2.6 11/08/2022 08:07 AM   LDLCALC 56 11/08/2022 08:07 AM   LDLCALC 59 09/19/2017 12:00 AM   LDLDIRECT 173.9 06/25/2013 09:48 AM    Wt Readings from Last  3 Encounters:  03/28/23 162 lb 12.8 oz (73.8 kg)  03/10/23 162 lb 11.2 oz (73.8 kg)  03/02/23 162 lb 11.2 oz (73.8 kg)     Objective:    Vital Signs:  BP (!) 132/52 (BP Location: Left Arm, Patient Position: Sitting, Cuff Size: Normal)   Pulse 73   Ht 5\' 1"  (1.549 m)   Wt 162 lb 12.8 oz (73.8 kg)   SpO2 97%   BMI 30.76 kg/m     General: Alert, oriented x3, no distress, appears well.  Mildly obese.  Looks younger than stated age. Head: no evidence of trauma, PERRL, EOMI, no exophtalmos or lid lag, no myxedema, no xanthelasma; normal ears, nose and oropharynx Neck: normal jugular venous pulsations and no hepatojugular reflux; brisk carotid pulses without delay and no carotid bruits Chest: clear to auscultation, no signs of consolidation by percussion or palpation, normal fremitus, symmetrical and full respiratory excursions Cardiovascular: normal position and quality of the apical impulse, regular rhythm, normal first and second heart sounds, no murmurs, rubs or gallops Abdomen: no tenderness or distention, no masses by palpation, no abnormal pulsatility or arterial bruits, normal bowel sounds, no hepatosplenomegaly Extremities: no clubbing, cyanosis or edema; 2+ radial, ulnar and brachial pulses bilaterally; 2+ right femoral, posterior tibial and dorsalis pedis pulses; 2+ left femoral, posterior tibial and dorsalis pedis pulses; no subclavian or femoral bruits Neurological: grossly nonfocal except very hard of hearing Psych: Normal mood and affect    Assessment and plan  1. Atrial fibrillation with RVR (HCC)   2. Acquired thrombophilia (HCC)   3. Chronic diastolic heart failure (HCC)   4. Venous (peripheral) insufficiency   5. Coronary artery disease involving native coronary artery of native heart with angina pectoris (HCC)   6. Essential hypertension   7. PVCs (premature ventricular contractions)   8. Hypomagnesemia        Palpitations: Unclear whether this could have been  an episode of self resolved atrial fibrillation or isolated PACs or PVCs.  Hypomagnesemia is a recurrent pattern.   Hypomagnesemia: Probably caused by chronic use of PPI as well as loop diuretics.  She is on a magnesium supplement but would also benefit from switching from PPI to alternative treatment for GERD such as an H2 blocker (ranitidine/nizatidine/famotidine, but avoid cimetidine due to drug interactions). AFib: On beta-blocker and on chronic anticoagulation.   CHA2DS2-VASc score 5-6 (age 8, gender,  CHF, +/-suspected CAD,  hypertension). CHF: Preserved overall LVEF, asymptomatic.  Has mild edema (which is also due to peripheral venous insufficiency), but no other signs of hypervolemia.  Potassium levels were normal at her recent ER evaluation and she has recently started a magnesium supplement. Anticoagulation: No bleeding problems on Eliquis  HLP: On statin, with excellent reduction in LDL cholesterol at 56. CAD: Currently asymptomatic.  Suspected but not proven since we decided not to proceed angiography.  Symptoms have been provoked by rapid arrhythmia.  Suspect underlying CAD, but conservative management preferred in view of age and preserved left ventricular systolic function.  Medications include statin and beta-blocker.  Not on aspirin due to full anticoagulation with apixaban. HTN: Well-controlled.  She tends to run a rather low diastolic blood pressure.    Patient Instructions  Medication Instructions:  STOP TAKING PROTONIX  INSTEAD TAKE- EITHER ONE OF THESE 3: RANITIDINE 150 MG, NIZATIDINE 150 MG, OR FAMOTIDINE 20 MG *If you need a refill on your cardiac medications before your next appointment, please call your pharmacy*   Follow-Up: At [facility], you and your health needs are our priority.  As part of our continuing mission to provide you with exceptional heart care, we have created designated Provider Care Teams.  These Care Teams include your primary Cardiologist  (physician) and Advanced Practice Providers (APPs -  Physician Assistants and Nurse Practitioners) who all work together to provide you with the care you need, when you need it.  We recommend signing up for the patient portal called "MyChart".  Sign up information is provided on this After Visit Summary.  MyChart is used to connect with patients for Virtual Visits (Telemedicine).  Patients are able to view lab/test results, encounter notes, upcoming appointments, etc.  Non-urgent messages can be sent to your provider as well.   To learn more about what you can do with MyChart, go to [email or web address].    Your next appointment:   1 year(s)  Provider:   Thurmon Fair, MD      Signed, Thurmon Fair, MD  03/28/2023 6:24 PM    [facility]

## 2023-03-28 NOTE — Patient Instructions (Signed)
Medication Instructions:  STOP TAKING PROTONIX  INSTEAD TAKE- EITHER ONE OF THESE 3: RANITIDINE 150 MG, NIZATIDINE 150 MG, OR FAMOTIDINE 20 MG *If you need a refill on your cardiac medications before your next appointment, please call your pharmacy*   Follow-Up: At [facility], you and your health needs are our priority.  As part of our continuing mission to provide you with exceptional heart care, we have created designated Provider Care Teams.  These Care Teams include your primary Cardiologist (physician) and Advanced Practice Providers (APPs -  Physician Assistants and Nurse Practitioners) who all work together to provide you with the care you need, when you need it.  We recommend signing up for the patient portal called "MyChart".  Sign up information is provided on this After Visit Summary.  MyChart is used to connect with patients for Virtual Visits (Telemedicine).  Patients are able to view lab/test results, encounter notes, upcoming appointments, etc.  Non-urgent messages can be sent to your provider as well.   To learn more about what you can do with MyChart, go to [email or web address].    Your next appointment:   1 year(s)  Provider:   Thurmon Fair, MD

## 2023-03-29 ENCOUNTER — Encounter: Payer: Self-pay | Admitting: Allergy and Immunology

## 2023-03-29 ENCOUNTER — Other Ambulatory Visit: Payer: Self-pay

## 2023-03-29 ENCOUNTER — Ambulatory Visit (INDEPENDENT_AMBULATORY_CARE_PROVIDER_SITE_OTHER): Payer: MEDICARE | Admitting: Allergy and Immunology

## 2023-03-29 VITALS — BP 102/48 | HR 80 | Temp 98.1°F | Resp 16 | Ht 62.0 in | Wt 161.8 lb

## 2023-03-29 DIAGNOSIS — J310 Chronic rhinitis: Secondary | ICD-10-CM

## 2023-03-29 NOTE — Progress Notes (Unsigned)
[facility] [place] [facility] [place] [facility] [place] [facility] [place]   Follow-up Note  Referring Provider: Mahlon Gammon, MD Primary Provider: Mahlon Gammon, MD Date of Office Visit: 03/29/2023  Subjective:   [name] (DOB: 1922/05/14) is a 89 y.o. female who [facility] in re-evaluation of the following:  HPI: [name] returns to this clinic in evaluation of chronic rhinitis.  I have not seen her in this clinic since 27 October 2021.  She gets some improvement regarding her chronic runny nose while using nasal ipratropium but she is only using it 1 time per day and she does get some runny nose throughout the day.  She has discontinued her montelukast and she cannot really tell any difference regarding her nasal issue whether she uses this medication or not.  Allergies as of 03/29/2023       Reactions   Celecoxib Swelling   Swelling-leg        Medication List    atorvastatin 40 MG tablet Commonly known as: LIPITOR TAKE 1 TABLET BY MOUTH EVERY DAY   Eliquis 5 MG Tabs tablet Generic drug: apixaban TAKE 1 TABLET BY MOUTH TWICE A DAY   furosemide 40 MG tablet Commonly known as: LASIX Take 1.5 tablets (60 mg total) by mouth daily.   ipratropium 0.06 % nasal spray Commonly known as: ATROVENT PLACE 2 SPRAYS INTO BOTH NOSTRILS 4 TIMES A DAY   Magnesium Oxide 250 MG Tabs Take 1 tablet (250 mg total) by mouth daily.   metoprolol succinate 25 MG 24 hr tablet Commonly known as: TOPROL-XL TAKE 1 TABLET BY MOUTH EVERYDAY AT BEDTIME   montelukast 10 MG tablet Commonly known as: SINGULAIR TAKE 1 TABLET BY MOUTH AT NIGHT FOR COUGH OR WHEEZING   pantoprazole 40 MG tablet Commonly known as: PROTONIX TAKE 1 TABLET BY MOUTH EVERY DAY   potassium chloride SA 20 MEQ tablet Commonly known as: KLOR-CON M Take 1 tablet (20 mEq total) by mouth 2 (two) times daily.   PreserVision AREDS 2 Caps Take 1 capsule by mouth 2 (two) times daily.    tamoxifen 20 MG tablet Commonly known as: NOLVADEX TAKE 1 TABLET BY MOUTH EVERY DAY    Past Medical History:  Diagnosis Date   Allergic rhinitis, seasonal    Breast cancer of upper-outer quadrant of right female breast (HCC) 09/13/2014   Chronic venous insufficiency    Dermatophytosis of nail    Dysrhythmia    H/o Atrial fibrillation   Edema leg    Family history of malignant neoplasm of breast    Family history of malignant neoplasm of ovary    Goiter    right thyroid nodule    HTN (hypertension) 08/13/2017   HX: breast cancer    bilateral   Menopausal syndrome    Osteoarthritis    Osteoporosis    Squamous cell skin cancer    right lower leg   Wears glasses    Wears hearing aid    both ears    Past Surgical History:  Procedure Laterality Date   bilateral lumpectomies for breast cancer  Aug. 2007   BREAST LUMPECTOMY WITH RADIOACTIVE SEED LOCALIZATION Right 10/01/2014   Procedure: BREAST LUMPECTOMY WITH RADIOACTIVE SEED LOCALIZATION;  Surgeon: Harriette Bouillon, MD;  Location: [facility];  Service: General;  Laterality: Right;   BREAST LUMPECTOMY WITH RADIOACTIVE SEED LOCALIZATION Left 03/22/2019   Procedure: LEFT BREAST LUMPECTOMY WITH RADIOACTIVE SEED LOCALIZATION;  Surgeon: Harriette Bouillon,  MD;  Location: [facility];  Service: General;  Laterality: Left;   BREAST LUMPECTOMY WITH RADIOACTIVE SEED LOCALIZATION Right 08/18/2021   Procedure: RIGHT BREAST LUMPECTOMY WITH RADIOACTIVE SEED LOCALIZATION;  Surgeon: Harriette Bouillon, MD;  Location: [facility];  Service: General;  Laterality: Right;   CATARACT EXTRACTION     CHOLECYSTECTOMY N/A 08/13/2017   Procedure: LAPAROSCOPIC CHOLECYSTECTOMY WITH INTRAOPERATIVE CHOLANGIOGRAM;  Surgeon: Glenna Fellows, MD;  Location: [facility];  Service: General;  Laterality: N/A;   DILATION AND CURETTAGE OF UTERUS     ESOPHAGOGASTRODUODENOSCOPY  07/26/2011   Procedure: ESOPHAGOGASTRODUODENOSCOPY (EGD);  Surgeon: Petra Kuba, MD;  Location: [facility];  Service: Endoscopy;  Laterality: N/A;   history of lumbar compression fracture     left eardum sx  1983   left knee arthroscopic surgery     no screening colonoscopy     s/p bilateral lumpectomies     SAVORY DILATION  07/26/2011   Procedure: SAVORY DILATION;  Surgeon: Petra Kuba, MD;  Location: [facility];  Service: Endoscopy;  Laterality: N/A;   status post resection squamous cell cancer right lower leg     TONSILLECTOMY     UMBILICAL HERNIA REPAIR      Review of systems negative except as noted in HPI / PMHx or noted below:  Review of Systems  Constitutional: Negative.   HENT: Negative.    Eyes: Negative.   Respiratory: Negative.    Cardiovascular: Negative.   Gastrointestinal: Negative.   Genitourinary: Negative.   Musculoskeletal: Negative.   Skin: Negative.   Neurological: Negative.   Endo/Heme/Allergies: Negative.   Psychiatric/Behavioral: Negative.       Objective:   Vitals:   03/29/23 1634  BP: (!) 102/48  Pulse: 80  Resp: 16  Temp: 98.1 F (36.7 C)  SpO2: 97%   Height: 5\' 2"  (157.5 cm)  Weight: 161 lb 12.8 oz (73.4 kg)   Physical Exam HENT:     Nose: No mucosal edema or rhinorrhea.     Mouth/Throat:     Mouth: Mucous membranes are moist.     Tongue: No lesions.     Pharynx: No pharyngeal swelling, oropharyngeal exudate, posterior oropharyngeal erythema or postnasal drip.     Diagnostics: none  Assessment and Plan:   1. Non-allergic rhinitis    1.  Continue Ipratropium 0.06% 2 sprays each nostril every 6 hours to dry up nose   2.  Return to clinic in 12 months or earlier if problem  3. Obtain fall flu vaccine and RSV vaccine  [name] will use her nasal ipratropium up to 4 times per day if needed to dry up her nose and if she does well she can follow-up with her primary care doctor to get this refilled next year or she can return to this clinic in 1 year for refill.  Taylor Schimke, MD Allergy / Immunology [facility]

## 2023-03-29 NOTE — Patient Instructions (Addendum)
      1.  Continue Ipratropium 0.06% 2 sprays each nostril every 6 hours to dry up nose   2.  Return to clinic in 12 months or earlier if problem  3. Obtain fall flu vaccine and RSV vaccine

## 2023-03-30 ENCOUNTER — Encounter: Payer: Self-pay | Admitting: Internal Medicine

## 2023-03-30 ENCOUNTER — Non-Acute Institutional Stay: Payer: MEDICARE | Admitting: Internal Medicine

## 2023-03-30 ENCOUNTER — Encounter: Payer: Self-pay | Admitting: Allergy and Immunology

## 2023-03-30 VITALS — BP 118/72 | HR 64 | Temp 97.7°F | Resp 17 | Ht 62.0 in | Wt 161.7 lb

## 2023-03-30 DIAGNOSIS — I480 Paroxysmal atrial fibrillation: Secondary | ICD-10-CM | POA: Diagnosis not present

## 2023-03-30 DIAGNOSIS — K219 Gastro-esophageal reflux disease without esophagitis: Secondary | ICD-10-CM

## 2023-03-30 DIAGNOSIS — G3184 Mild cognitive impairment, so stated: Secondary | ICD-10-CM

## 2023-03-30 DIAGNOSIS — R600 Localized edema: Secondary | ICD-10-CM | POA: Diagnosis not present

## 2023-03-30 DIAGNOSIS — I5032 Chronic diastolic (congestive) heart failure: Secondary | ICD-10-CM

## 2023-03-30 DIAGNOSIS — I10 Essential (primary) hypertension: Secondary | ICD-10-CM

## 2023-03-30 DIAGNOSIS — Z853 Personal history of malignant neoplasm of breast: Secondary | ICD-10-CM

## 2023-03-30 DIAGNOSIS — E7800 Pure hypercholesterolemia, unspecified: Secondary | ICD-10-CM

## 2023-03-31 ENCOUNTER — Telehealth: Payer: Self-pay | Admitting: Allergy and Immunology

## 2023-03-31 ENCOUNTER — Encounter: Payer: Self-pay | Admitting: Podiatry

## 2023-03-31 ENCOUNTER — Ambulatory Visit (INDEPENDENT_AMBULATORY_CARE_PROVIDER_SITE_OTHER): Payer: MEDICARE | Admitting: Podiatry

## 2023-03-31 DIAGNOSIS — M79676 Pain in unspecified toe(s): Secondary | ICD-10-CM | POA: Diagnosis not present

## 2023-03-31 DIAGNOSIS — D689 Coagulation defect, unspecified: Secondary | ICD-10-CM | POA: Diagnosis not present

## 2023-03-31 DIAGNOSIS — R2681 Unsteadiness on feet: Secondary | ICD-10-CM | POA: Diagnosis not present

## 2023-03-31 DIAGNOSIS — D2371 Other benign neoplasm of skin of right lower limb, including hip: Secondary | ICD-10-CM

## 2023-03-31 DIAGNOSIS — D2372 Other benign neoplasm of skin of left lower limb, including hip: Secondary | ICD-10-CM

## 2023-03-31 DIAGNOSIS — B351 Tinea unguium: Secondary | ICD-10-CM | POA: Diagnosis not present

## 2023-03-31 DIAGNOSIS — Z9181 History of falling: Secondary | ICD-10-CM | POA: Diagnosis not present

## 2023-03-31 DIAGNOSIS — R278 Other lack of coordination: Secondary | ICD-10-CM | POA: Diagnosis not present

## 2023-03-31 DIAGNOSIS — M6281 Muscle weakness (generalized): Secondary | ICD-10-CM | POA: Diagnosis not present

## 2023-03-31 MED ORDER — IPRATROPIUM BROMIDE 0.06 % NA SOLN: NASAL | 10 refills | Status: DC

## 2023-03-31 NOTE — Telephone Encounter (Signed)
Called patient - spoke to Cindi, her caregiver/friend - obtained verbal authorization - DOB/No DR on file - advised Ipratropium (Atrovent) 0.06% prescription will be sent in to [facility]  Cindi verbalized understanding, no further questions.

## 2023-03-31 NOTE — Telephone Encounter (Signed)
Patient called stating she had an appointment with Dr Lucie Leather on Tuesday. Patient states when she went to pick up her nasal spray the pharmacy said it had not been called in yet.

## 2023-04-01 NOTE — Progress Notes (Signed)
Location:  [facility] Service:  Clinic (12)  Provider:   Code Status: DNR Goals of Care:     03/30/2023   10:54 AM  Advanced Directives  Does Patient Have a Medical Advance Directive? Yes  Type of Estate agent of [place];Living will;Out of facility DNR (pink MOST or yellow form)  Does patient want to make changes to medical advance directive? No - Patient declined     Chief Complaint  Patient presents with   Medical Management of Chronic Issues    Patient is being seen for a 4 month follow up    Immunizations    Patient is due for shingles, flu and covid     HPI: Patient is a 89 y.o. female seen today for medical management of chronic diseases.    [place] [facility] with her husband  Micah Flesher to ED again on 8/22 with rapid palpitations and lightheadedness and mild chest discomfort EKG showed sinus rhythm with PVC/PAC  troponin was normal chest x-ray was normal she did have low magnesium level. Dr. Royann Shivers suggested for her to stop her PPI and change it to H2 blocker which they have done already to help her hypomagnesemia.  Her lower extremity edema seems to be at baseline.  She denies any palpitations since her ED visit. No shortness of breath no chest pain Patient continues to have decline in her memory.  Her husband is now managing her medications they have also hired help She has not had any falls in uses walker Her appetite is poor but she uses boost  as history of bilateral breast cancer s/p lumpectomy and  restarted back on tamoxifen Mammogram pending   Also has CKD and GERD  Past Medical History:  Diagnosis Date   Allergic rhinitis, seasonal    Breast cancer of upper-outer quadrant of right female breast (HCC) 09/13/2014   Chronic venous insufficiency    Dermatophytosis of nail    Dysrhythmia    H/o Atrial fibrillation   Edema leg    Family history of malignant neoplasm of breast    Family history of malignant  neoplasm of ovary    Goiter    right thyroid nodule    HTN (hypertension) 08/13/2017   HX: breast cancer    bilateral   Menopausal syndrome    Osteoarthritis    Osteoporosis    Squamous cell skin cancer    right lower leg   Wears glasses    Wears hearing aid    both ears    Past Surgical History:  Procedure Laterality Date   bilateral lumpectomies for breast cancer  Aug. 2007   BREAST LUMPECTOMY WITH RADIOACTIVE SEED LOCALIZATION Right 10/01/2014   Procedure: BREAST LUMPECTOMY WITH RADIOACTIVE SEED LOCALIZATION;  Surgeon: Harriette Bouillon, MD;  Location: [facility];  Service: General;  Laterality: Right;   BREAST LUMPECTOMY WITH RADIOACTIVE SEED LOCALIZATION Left 03/22/2019   Procedure: LEFT BREAST LUMPECTOMY WITH RADIOACTIVE SEED LOCALIZATION;  Surgeon: Harriette Bouillon, MD;  Location: [facility];  Service: General;  Laterality: Left;   BREAST LUMPECTOMY WITH RADIOACTIVE SEED LOCALIZATION Right 08/18/2021   Procedure: RIGHT BREAST LUMPECTOMY WITH RADIOACTIVE SEED LOCALIZATION;  Surgeon: Harriette Bouillon, MD;  Location: [facility];  Service: General;  Laterality: Right;   CATARACT EXTRACTION     CHOLECYSTECTOMY N/A 08/13/2017   Procedure: LAPAROSCOPIC CHOLECYSTECTOMY WITH INTRAOPERATIVE CHOLANGIOGRAM;  Surgeon: Glenna Fellows, MD;  Location: [facility];  Service: General;  Laterality: N/A;  DILATION AND CURETTAGE OF UTERUS     ESOPHAGOGASTRODUODENOSCOPY  07/26/2011   Procedure: ESOPHAGOGASTRODUODENOSCOPY (EGD);  Surgeon: Petra Kuba, MD;  Location: [facility];  Service: Endoscopy;  Laterality: N/A;   history of lumbar compression fracture     left eardum sx  1983   left knee arthroscopic surgery     no screening colonoscopy     s/p bilateral lumpectomies     SAVORY DILATION  07/26/2011   Procedure: SAVORY DILATION;  Surgeon: Petra Kuba, MD;  Location: [facility];  Service: Endoscopy;  Laterality: N/A;   status post resection squamous cell cancer right lower leg     TONSILLECTOMY      UMBILICAL HERNIA REPAIR      Allergies  Allergen Reactions   Celecoxib Swelling    Swelling-leg    Outpatient Encounter Medications as of 03/30/2023  Medication Sig   apixaban (ELIQUIS) 5 MG TABS tablet TAKE 1 TABLET BY MOUTH TWICE A DAY   atorvastatin (LIPITOR) 40 MG tablet TAKE 1 TABLET BY MOUTH EVERY DAY   famotidine (PEPCID) 20 MG tablet Take 20 mg by mouth 2 (two) times daily.   furosemide (LASIX) 40 MG tablet Take 1.5 tablets (60 mg total) by mouth daily.   Magnesium Oxide 250 MG TABS Take 1 tablet (250 mg total) by mouth daily.   metoprolol succinate (TOPROL-XL) 25 MG 24 hr tablet TAKE 1 TABLET BY MOUTH EVERYDAY AT BEDTIME   montelukast (SINGULAIR) 10 MG tablet TAKE 1 TABLET BY MOUTH AT NIGHT FOR COUGH OR WHEEZING   Multiple Vitamins-Minerals (PRESERVISION AREDS 2) CAPS Take 1 capsule by mouth 2 (two) times daily.    potassium chloride SA (KLOR-CON M) 20 MEQ tablet Take 1 tablet (20 mEq total) by mouth 2 (two) times daily.   tamoxifen (NOLVADEX) 20 MG tablet TAKE 1 TABLET BY MOUTH EVERY DAY   [DISCONTINUED] ipratropium (ATROVENT) 0.06 % nasal spray PLACE 2 SPRAYS INTO BOTH NOSTRILS 4 TIMES A DAY   [DISCONTINUED] ketoconazole (NIZORAL) 2 % cream Apply 1 application  topically 2 (two) times daily as needed for irritation. (Patient not taking: Reported on 03/28/2023)   [DISCONTINUED] pantoprazole (PROTONIX) 40 MG tablet TAKE 1 TABLET BY MOUTH EVERY DAY (Patient not taking: Reported on 03/30/2023)   [DISCONTINUED] tobramycin (TOBREX) 0.3 % ophthalmic solution 1 drop 4 (four) times daily. (Patient not taking: Reported on 03/28/2023)   No facility-administered encounter medications on file as of 03/30/2023.    Review of Systems:  Review of Systems  Constitutional:  Positive for activity change and appetite change.  HENT: Negative.    Respiratory:  Negative for cough and shortness of breath.   Cardiovascular:  Positive for leg swelling.  Gastrointestinal:  Negative for constipation.   Genitourinary: Negative.   Musculoskeletal:  Positive for gait problem. Negative for arthralgias and myalgias.  Skin: Negative.   Neurological:  Positive for weakness. Negative for dizziness.  Psychiatric/Behavioral:  Positive for confusion. Negative for dysphoric mood and sleep disturbance.     Health Maintenance  Topic Date Due   Zoster Vaccines- Shingrix (1 of 2) 12/17/1940   INFLUENZA VACCINE  02/17/2023   COVID-19 Vaccine (5 - 2023-24 season) 03/20/2023   Medicare Annual Wellness (AWV)  01/03/2024   Pneumonia Vaccine 73+ Years old  Completed   DEXA SCAN  Completed   HPV VACCINES  Aged Out   DTaP/Tdap/Td  Discontinued   MAMMOGRAM  Discontinued    Physical Exam: Vitals:   03/30/23 1052  Pulse: 64  Resp: 17  Temp: 97.7 F (36.5 C)  TempSrc: Temporal  SpO2: 95%  Weight: 161 lb 11.2 oz (73.3 kg)  Height: 5\' 2"  (1.575 m)   Body mass index is 29.58 kg/m. Physical Exam Vitals reviewed.  Constitutional:      Appearance: Normal appearance.  HENT:     Head: Normocephalic.     Nose: Nose normal.     Mouth/Throat:     Mouth: Mucous membranes are moist.     Pharynx: Oropharynx is clear.  Eyes:     Pupils: Pupils are equal, round, and reactive to light.  Cardiovascular:     Rate and Rhythm: Normal rate and regular rhythm.     Pulses: Normal pulses.     Heart sounds: Normal heart sounds. No murmur heard. Pulmonary:     Effort: Pulmonary effort is normal.     Breath sounds: Normal breath sounds.  Abdominal:     General: Abdomen is flat. Bowel sounds are normal.     Palpations: Abdomen is soft.  Musculoskeletal:        General: Swelling present.     Cervical back: Neck supple.  Skin:    General: Skin is warm.  Neurological:     General: No focal deficit present.     Mental Status: She is alert.  Psychiatric:        Mood and Affect: Mood normal.        Thought Content: Thought content normal.     Labs reviewed: Basic Metabolic Panel: Recent Labs     05/12/22 1532 07/22/22 1546 11/08/22 0807 11/27/22 1456 11/28/22 0657 12/27/22 0803 03/10/23 1557 03/24/23 0810  NA 142   < > 143 141 142  --  141 143  K 4.6   < > 3.7 4.0 3.4*  --  4.0 4.4  CL 104   < > 107 109 110  --  112* 108  CO2 25   < > 27 22 22   --  20* 23  GLUCOSE 128*   < > 123* 139* 123*  --  125* 105*  BUN 35   < > 41* 44* 34*  --  42* 43*  CREATININE 1.22*   < > 1.22* 1.18* 1.04*  --  1.10* 1.17*  CALCIUM 9.0   < > 8.6 8.6* 8.1*  --  8.0* 8.5*  MG 1.8  --   --  1.4* 2.1  --  1.5*  --   TSH 3.160  --  6.70*  --   --  6.33*  --   --    < > = values in this interval not displayed.   Liver Function Tests: Recent Labs    07/22/22 1546 11/08/22 0807 11/27/22 1456 03/10/23 1557 03/24/23 0810  AST 16   < > 21 19 16   ALT 13   < > 15 11 12   ALKPHOS 74  --  64 71  --   BILITOT 0.4   < > 0.5 0.2* 0.3  PROT 6.1*   < > 5.7* 5.6* 5.8*  ALBUMIN 3.8  --  3.0* 2.9*  --    < > = values in this interval not displayed.   Recent Labs    11/27/22 1456  LIPASE 29   No results for input(s): "AMMONIA" in the last 8760 hours. CBC: Recent Labs    11/08/22 0807 11/27/22 1456 11/28/22 0657 03/10/23 1557 03/24/23 0810  WBC 5.2   < > 4.3 6.2 5.6  NEUTROABS 3,765  --   --   --  3,976  HGB 11.5*   < > 10.1* 9.9* 10.2*  HCT 35.9   < > 32.0* 33.1* 32.3*  MCV 86.1   < > 86.3 86.9 82.8  PLT 200   < > 131* 162 213   < > = values in this interval not displayed.   Lipid Panel: Recent Labs    11/08/22 0807  CHOL 124  HDL 47*  LDLCALC 56  TRIG 409  CHOLHDL 2.6   Lab Results  Component Value Date   HGBA1C 5.8 (H) 01/01/2021    Procedures since last visit: DG Chest Portable 1 View  Result Date: 03/10/2023 CLINICAL DATA:  Chest pain EXAM: PORTABLE CHEST 1 VIEW COMPARISON:  X-ray 11/27/2022 FINDINGS: Underinflation. Borderline cardiopericardial silhouette. No consolidation, pneumothorax or effusion. No edema. Interstitial changes. Overlapping cardiac leads. Curvature and  degenerative changes along the spine. Artifact from the patient's clothing. IMPRESSION: Underinflation with bronchovascular crowding. Borderline size heart. Diffuse interstitial changes, likely chronic. Electronically Signed   By: Karen Kays M.D.   On: 03/10/2023 15:15    Assessment/Plan 1. Paroxysmal atrial fibrillation (HCC) Eliquis and Metoprolol Recent Palpitations Per Cardiology to correct Magnesium to see if it helps  2. Bilateral leg edema Stable on Lasix  3. Chronic diastolic heart failure (HCC) On Lasix  4. Hypomagnesemia Now on Low dose of magnesium Will check the level before next visit  5. Gastroesophageal reflux disease, unspecified whether esophagitis present PPI stopped and she is now on Famotidine  6. BREAST CANCER, HX OF On Tamoxifen Mammogram Pending  7. Essential hypertension   8. Hypercholesterolemia Statin  9. Mild cognitive impairment MMSE next visit Husband managing Meds They have hired help Have refused AL right now   Labs/tests ordered:  * No order type specified * Next appt:  06/23/2023

## 2023-04-03 NOTE — Progress Notes (Signed)
She presents today chief complaint of painful elongated toenails and multiple benign skin lesions bilateral foot.  Objective: Vital signs stable oriented x 3 pulses are palpable.  No open lesions or wounds.  Fat pad atrophy is resulting in benign lesions beneath the metatarsal heads bilaterally.  These are exquisitely tender on palpation.  Toenails are long thick yellow dystrophic clinically mycotic.  Assessment: Pain limb secondary benign skin lesions fat pad atrophy metatarsalgia and onychomycosis.  Plan: Debridement of toenails and debridement of benign skin lesions.  Follow-up with [place] in 3 months

## 2023-04-04 DIAGNOSIS — Z9181 History of falling: Secondary | ICD-10-CM | POA: Diagnosis not present

## 2023-04-04 DIAGNOSIS — M6281 Muscle weakness (generalized): Secondary | ICD-10-CM | POA: Diagnosis not present

## 2023-04-04 DIAGNOSIS — R278 Other lack of coordination: Secondary | ICD-10-CM | POA: Diagnosis not present

## 2023-04-04 DIAGNOSIS — R2681 Unsteadiness on feet: Secondary | ICD-10-CM | POA: Diagnosis not present

## 2023-04-05 DIAGNOSIS — R278 Other lack of coordination: Secondary | ICD-10-CM | POA: Diagnosis not present

## 2023-04-05 DIAGNOSIS — R2681 Unsteadiness on feet: Secondary | ICD-10-CM | POA: Diagnosis not present

## 2023-04-05 DIAGNOSIS — Z9181 History of falling: Secondary | ICD-10-CM | POA: Diagnosis not present

## 2023-04-05 DIAGNOSIS — M6281 Muscle weakness (generalized): Secondary | ICD-10-CM | POA: Diagnosis not present

## 2023-04-07 DIAGNOSIS — M6281 Muscle weakness (generalized): Secondary | ICD-10-CM | POA: Diagnosis not present

## 2023-04-07 DIAGNOSIS — Z9181 History of falling: Secondary | ICD-10-CM | POA: Diagnosis not present

## 2023-04-07 DIAGNOSIS — R278 Other lack of coordination: Secondary | ICD-10-CM | POA: Diagnosis not present

## 2023-04-07 DIAGNOSIS — R2681 Unsteadiness on feet: Secondary | ICD-10-CM | POA: Diagnosis not present

## 2023-04-11 DIAGNOSIS — Z9181 History of falling: Secondary | ICD-10-CM | POA: Diagnosis not present

## 2023-04-11 DIAGNOSIS — M6281 Muscle weakness (generalized): Secondary | ICD-10-CM | POA: Diagnosis not present

## 2023-04-11 DIAGNOSIS — R2681 Unsteadiness on feet: Secondary | ICD-10-CM | POA: Diagnosis not present

## 2023-04-11 DIAGNOSIS — R278 Other lack of coordination: Secondary | ICD-10-CM | POA: Diagnosis not present

## 2023-04-14 DIAGNOSIS — R2681 Unsteadiness on feet: Secondary | ICD-10-CM | POA: Diagnosis not present

## 2023-04-14 DIAGNOSIS — Z9181 History of falling: Secondary | ICD-10-CM | POA: Diagnosis not present

## 2023-04-14 DIAGNOSIS — M6281 Muscle weakness (generalized): Secondary | ICD-10-CM | POA: Diagnosis not present

## 2023-04-14 DIAGNOSIS — R278 Other lack of coordination: Secondary | ICD-10-CM | POA: Diagnosis not present

## 2023-04-18 DIAGNOSIS — R2681 Unsteadiness on feet: Secondary | ICD-10-CM | POA: Diagnosis not present

## 2023-04-18 DIAGNOSIS — M6281 Muscle weakness (generalized): Secondary | ICD-10-CM | POA: Diagnosis not present

## 2023-04-18 DIAGNOSIS — R278 Other lack of coordination: Secondary | ICD-10-CM | POA: Diagnosis not present

## 2023-04-18 DIAGNOSIS — Z9181 History of falling: Secondary | ICD-10-CM | POA: Diagnosis not present

## 2023-04-19 DIAGNOSIS — H43813 Vitreous degeneration, bilateral: Secondary | ICD-10-CM | POA: Diagnosis not present

## 2023-04-19 DIAGNOSIS — H35373 Puckering of macula, bilateral: Secondary | ICD-10-CM | POA: Diagnosis not present

## 2023-04-19 DIAGNOSIS — H353211 Exudative age-related macular degeneration, right eye, with active choroidal neovascularization: Secondary | ICD-10-CM | POA: Diagnosis not present

## 2023-04-19 DIAGNOSIS — H353123 Nonexudative age-related macular degeneration, left eye, advanced atrophic without subfoveal involvement: Secondary | ICD-10-CM | POA: Diagnosis not present

## 2023-04-20 DIAGNOSIS — R278 Other lack of coordination: Secondary | ICD-10-CM | POA: Diagnosis not present

## 2023-04-20 DIAGNOSIS — R41841 Cognitive communication deficit: Secondary | ICD-10-CM | POA: Diagnosis not present

## 2023-04-20 DIAGNOSIS — M6281 Muscle weakness (generalized): Secondary | ICD-10-CM | POA: Diagnosis not present

## 2023-04-20 DIAGNOSIS — Z9181 History of falling: Secondary | ICD-10-CM | POA: Diagnosis not present

## 2023-04-20 DIAGNOSIS — R2681 Unsteadiness on feet: Secondary | ICD-10-CM | POA: Diagnosis not present

## 2023-04-21 DIAGNOSIS — R41841 Cognitive communication deficit: Secondary | ICD-10-CM | POA: Diagnosis not present

## 2023-04-21 DIAGNOSIS — Z9181 History of falling: Secondary | ICD-10-CM | POA: Diagnosis not present

## 2023-04-21 DIAGNOSIS — R278 Other lack of coordination: Secondary | ICD-10-CM | POA: Diagnosis not present

## 2023-04-21 DIAGNOSIS — R2681 Unsteadiness on feet: Secondary | ICD-10-CM | POA: Diagnosis not present

## 2023-04-21 DIAGNOSIS — M6281 Muscle weakness (generalized): Secondary | ICD-10-CM | POA: Diagnosis not present

## 2023-04-22 DIAGNOSIS — R278 Other lack of coordination: Secondary | ICD-10-CM | POA: Diagnosis not present

## 2023-04-22 DIAGNOSIS — R2681 Unsteadiness on feet: Secondary | ICD-10-CM | POA: Diagnosis not present

## 2023-04-22 DIAGNOSIS — M6281 Muscle weakness (generalized): Secondary | ICD-10-CM | POA: Diagnosis not present

## 2023-04-22 DIAGNOSIS — R41841 Cognitive communication deficit: Secondary | ICD-10-CM | POA: Diagnosis not present

## 2023-04-22 DIAGNOSIS — Z9181 History of falling: Secondary | ICD-10-CM | POA: Diagnosis not present

## 2023-04-25 DIAGNOSIS — M6281 Muscle weakness (generalized): Secondary | ICD-10-CM | POA: Diagnosis not present

## 2023-04-25 DIAGNOSIS — R278 Other lack of coordination: Secondary | ICD-10-CM | POA: Diagnosis not present

## 2023-04-25 DIAGNOSIS — R41841 Cognitive communication deficit: Secondary | ICD-10-CM | POA: Diagnosis not present

## 2023-04-25 DIAGNOSIS — R2681 Unsteadiness on feet: Secondary | ICD-10-CM | POA: Diagnosis not present

## 2023-04-25 DIAGNOSIS — Z9181 History of falling: Secondary | ICD-10-CM | POA: Diagnosis not present

## 2023-04-27 DIAGNOSIS — R2681 Unsteadiness on feet: Secondary | ICD-10-CM | POA: Diagnosis not present

## 2023-04-27 DIAGNOSIS — M6281 Muscle weakness (generalized): Secondary | ICD-10-CM | POA: Diagnosis not present

## 2023-04-27 DIAGNOSIS — R278 Other lack of coordination: Secondary | ICD-10-CM | POA: Diagnosis not present

## 2023-04-27 DIAGNOSIS — R41841 Cognitive communication deficit: Secondary | ICD-10-CM | POA: Diagnosis not present

## 2023-04-27 DIAGNOSIS — Z9181 History of falling: Secondary | ICD-10-CM | POA: Diagnosis not present

## 2023-04-28 DIAGNOSIS — R2681 Unsteadiness on feet: Secondary | ICD-10-CM | POA: Diagnosis not present

## 2023-04-28 DIAGNOSIS — Z9181 History of falling: Secondary | ICD-10-CM | POA: Diagnosis not present

## 2023-04-28 DIAGNOSIS — M6281 Muscle weakness (generalized): Secondary | ICD-10-CM | POA: Diagnosis not present

## 2023-04-28 DIAGNOSIS — R41841 Cognitive communication deficit: Secondary | ICD-10-CM | POA: Diagnosis not present

## 2023-04-28 DIAGNOSIS — R278 Other lack of coordination: Secondary | ICD-10-CM | POA: Diagnosis not present

## 2023-04-29 DIAGNOSIS — Z9181 History of falling: Secondary | ICD-10-CM | POA: Diagnosis not present

## 2023-04-29 DIAGNOSIS — R41841 Cognitive communication deficit: Secondary | ICD-10-CM | POA: Diagnosis not present

## 2023-04-29 DIAGNOSIS — M6281 Muscle weakness (generalized): Secondary | ICD-10-CM | POA: Diagnosis not present

## 2023-04-29 DIAGNOSIS — R278 Other lack of coordination: Secondary | ICD-10-CM | POA: Diagnosis not present

## 2023-04-29 DIAGNOSIS — R2681 Unsteadiness on feet: Secondary | ICD-10-CM | POA: Diagnosis not present

## 2023-05-02 DIAGNOSIS — R2681 Unsteadiness on feet: Secondary | ICD-10-CM | POA: Diagnosis not present

## 2023-05-02 DIAGNOSIS — R41841 Cognitive communication deficit: Secondary | ICD-10-CM | POA: Diagnosis not present

## 2023-05-02 DIAGNOSIS — R278 Other lack of coordination: Secondary | ICD-10-CM | POA: Diagnosis not present

## 2023-05-02 DIAGNOSIS — Z9181 History of falling: Secondary | ICD-10-CM | POA: Diagnosis not present

## 2023-05-02 DIAGNOSIS — M6281 Muscle weakness (generalized): Secondary | ICD-10-CM | POA: Diagnosis not present

## 2023-05-03 DIAGNOSIS — R2681 Unsteadiness on feet: Secondary | ICD-10-CM | POA: Diagnosis not present

## 2023-05-03 DIAGNOSIS — Z9181 History of falling: Secondary | ICD-10-CM | POA: Diagnosis not present

## 2023-05-03 DIAGNOSIS — M6281 Muscle weakness (generalized): Secondary | ICD-10-CM | POA: Diagnosis not present

## 2023-05-03 DIAGNOSIS — R278 Other lack of coordination: Secondary | ICD-10-CM | POA: Diagnosis not present

## 2023-05-03 DIAGNOSIS — R41841 Cognitive communication deficit: Secondary | ICD-10-CM | POA: Diagnosis not present

## 2023-05-04 DIAGNOSIS — R2681 Unsteadiness on feet: Secondary | ICD-10-CM | POA: Diagnosis not present

## 2023-05-04 DIAGNOSIS — R41841 Cognitive communication deficit: Secondary | ICD-10-CM | POA: Diagnosis not present

## 2023-05-04 DIAGNOSIS — M6281 Muscle weakness (generalized): Secondary | ICD-10-CM | POA: Diagnosis not present

## 2023-05-04 DIAGNOSIS — R278 Other lack of coordination: Secondary | ICD-10-CM | POA: Diagnosis not present

## 2023-05-04 DIAGNOSIS — Z9181 History of falling: Secondary | ICD-10-CM | POA: Diagnosis not present

## 2023-05-05 DIAGNOSIS — Z9181 History of falling: Secondary | ICD-10-CM | POA: Diagnosis not present

## 2023-05-05 DIAGNOSIS — M6281 Muscle weakness (generalized): Secondary | ICD-10-CM | POA: Diagnosis not present

## 2023-05-05 DIAGNOSIS — R278 Other lack of coordination: Secondary | ICD-10-CM | POA: Diagnosis not present

## 2023-05-05 DIAGNOSIS — R41841 Cognitive communication deficit: Secondary | ICD-10-CM | POA: Diagnosis not present

## 2023-05-05 DIAGNOSIS — R2681 Unsteadiness on feet: Secondary | ICD-10-CM | POA: Diagnosis not present

## 2023-05-06 DIAGNOSIS — Z9181 History of falling: Secondary | ICD-10-CM | POA: Diagnosis not present

## 2023-05-06 DIAGNOSIS — R41841 Cognitive communication deficit: Secondary | ICD-10-CM | POA: Diagnosis not present

## 2023-05-06 DIAGNOSIS — M6281 Muscle weakness (generalized): Secondary | ICD-10-CM | POA: Diagnosis not present

## 2023-05-06 DIAGNOSIS — R2681 Unsteadiness on feet: Secondary | ICD-10-CM | POA: Diagnosis not present

## 2023-05-06 DIAGNOSIS — R278 Other lack of coordination: Secondary | ICD-10-CM | POA: Diagnosis not present

## 2023-05-09 DIAGNOSIS — Z9181 History of falling: Secondary | ICD-10-CM | POA: Diagnosis not present

## 2023-05-09 DIAGNOSIS — R2681 Unsteadiness on feet: Secondary | ICD-10-CM | POA: Diagnosis not present

## 2023-05-09 DIAGNOSIS — R278 Other lack of coordination: Secondary | ICD-10-CM | POA: Diagnosis not present

## 2023-05-09 DIAGNOSIS — R41841 Cognitive communication deficit: Secondary | ICD-10-CM | POA: Diagnosis not present

## 2023-05-09 DIAGNOSIS — M6281 Muscle weakness (generalized): Secondary | ICD-10-CM | POA: Diagnosis not present

## 2023-05-10 DIAGNOSIS — R2681 Unsteadiness on feet: Secondary | ICD-10-CM | POA: Diagnosis not present

## 2023-05-10 DIAGNOSIS — M6281 Muscle weakness (generalized): Secondary | ICD-10-CM | POA: Diagnosis not present

## 2023-05-10 DIAGNOSIS — Z9181 History of falling: Secondary | ICD-10-CM | POA: Diagnosis not present

## 2023-05-10 DIAGNOSIS — R41841 Cognitive communication deficit: Secondary | ICD-10-CM | POA: Diagnosis not present

## 2023-05-10 DIAGNOSIS — R278 Other lack of coordination: Secondary | ICD-10-CM | POA: Diagnosis not present

## 2023-05-11 DIAGNOSIS — R41841 Cognitive communication deficit: Secondary | ICD-10-CM | POA: Diagnosis not present

## 2023-05-11 DIAGNOSIS — Z9181 History of falling: Secondary | ICD-10-CM | POA: Diagnosis not present

## 2023-05-11 DIAGNOSIS — R2681 Unsteadiness on feet: Secondary | ICD-10-CM | POA: Diagnosis not present

## 2023-05-11 DIAGNOSIS — M6281 Muscle weakness (generalized): Secondary | ICD-10-CM | POA: Diagnosis not present

## 2023-05-11 DIAGNOSIS — R278 Other lack of coordination: Secondary | ICD-10-CM | POA: Diagnosis not present

## 2023-05-12 DIAGNOSIS — Z9181 History of falling: Secondary | ICD-10-CM | POA: Diagnosis not present

## 2023-05-12 DIAGNOSIS — R278 Other lack of coordination: Secondary | ICD-10-CM | POA: Diagnosis not present

## 2023-05-12 DIAGNOSIS — M6281 Muscle weakness (generalized): Secondary | ICD-10-CM | POA: Diagnosis not present

## 2023-05-12 DIAGNOSIS — R41841 Cognitive communication deficit: Secondary | ICD-10-CM | POA: Diagnosis not present

## 2023-05-12 DIAGNOSIS — R2681 Unsteadiness on feet: Secondary | ICD-10-CM | POA: Diagnosis not present

## 2023-05-14 ENCOUNTER — Other Ambulatory Visit: Payer: Self-pay | Admitting: Internal Medicine

## 2023-05-16 DIAGNOSIS — M6281 Muscle weakness (generalized): Secondary | ICD-10-CM | POA: Diagnosis not present

## 2023-05-16 DIAGNOSIS — Z9181 History of falling: Secondary | ICD-10-CM | POA: Diagnosis not present

## 2023-05-16 DIAGNOSIS — R278 Other lack of coordination: Secondary | ICD-10-CM | POA: Diagnosis not present

## 2023-05-16 DIAGNOSIS — R2681 Unsteadiness on feet: Secondary | ICD-10-CM | POA: Diagnosis not present

## 2023-05-16 DIAGNOSIS — R41841 Cognitive communication deficit: Secondary | ICD-10-CM | POA: Diagnosis not present

## 2023-05-18 DIAGNOSIS — R2681 Unsteadiness on feet: Secondary | ICD-10-CM | POA: Diagnosis not present

## 2023-05-18 DIAGNOSIS — Z9181 History of falling: Secondary | ICD-10-CM | POA: Diagnosis not present

## 2023-05-18 DIAGNOSIS — R278 Other lack of coordination: Secondary | ICD-10-CM | POA: Diagnosis not present

## 2023-05-18 DIAGNOSIS — R41841 Cognitive communication deficit: Secondary | ICD-10-CM | POA: Diagnosis not present

## 2023-05-18 DIAGNOSIS — M6281 Muscle weakness (generalized): Secondary | ICD-10-CM | POA: Diagnosis not present

## 2023-05-19 DIAGNOSIS — Z9181 History of falling: Secondary | ICD-10-CM | POA: Diagnosis not present

## 2023-05-19 DIAGNOSIS — R41841 Cognitive communication deficit: Secondary | ICD-10-CM | POA: Diagnosis not present

## 2023-05-19 DIAGNOSIS — R2681 Unsteadiness on feet: Secondary | ICD-10-CM | POA: Diagnosis not present

## 2023-05-19 DIAGNOSIS — M6281 Muscle weakness (generalized): Secondary | ICD-10-CM | POA: Diagnosis not present

## 2023-05-19 DIAGNOSIS — R278 Other lack of coordination: Secondary | ICD-10-CM | POA: Diagnosis not present

## 2023-05-20 DIAGNOSIS — R2681 Unsteadiness on feet: Secondary | ICD-10-CM | POA: Diagnosis not present

## 2023-05-20 DIAGNOSIS — R41841 Cognitive communication deficit: Secondary | ICD-10-CM | POA: Diagnosis not present

## 2023-05-20 DIAGNOSIS — Z9181 History of falling: Secondary | ICD-10-CM | POA: Diagnosis not present

## 2023-05-20 DIAGNOSIS — M6281 Muscle weakness (generalized): Secondary | ICD-10-CM | POA: Diagnosis not present

## 2023-05-20 DIAGNOSIS — R278 Other lack of coordination: Secondary | ICD-10-CM | POA: Diagnosis not present

## 2023-05-23 DIAGNOSIS — Z9181 History of falling: Secondary | ICD-10-CM | POA: Diagnosis not present

## 2023-05-23 DIAGNOSIS — R41841 Cognitive communication deficit: Secondary | ICD-10-CM | POA: Diagnosis not present

## 2023-05-23 DIAGNOSIS — M6281 Muscle weakness (generalized): Secondary | ICD-10-CM | POA: Diagnosis not present

## 2023-05-23 DIAGNOSIS — R278 Other lack of coordination: Secondary | ICD-10-CM | POA: Diagnosis not present

## 2023-05-23 DIAGNOSIS — R2681 Unsteadiness on feet: Secondary | ICD-10-CM | POA: Diagnosis not present

## 2023-05-25 DIAGNOSIS — R278 Other lack of coordination: Secondary | ICD-10-CM | POA: Diagnosis not present

## 2023-05-25 DIAGNOSIS — R41841 Cognitive communication deficit: Secondary | ICD-10-CM | POA: Diagnosis not present

## 2023-05-25 DIAGNOSIS — M6281 Muscle weakness (generalized): Secondary | ICD-10-CM | POA: Diagnosis not present

## 2023-05-25 DIAGNOSIS — Z9181 History of falling: Secondary | ICD-10-CM | POA: Diagnosis not present

## 2023-05-25 DIAGNOSIS — R2681 Unsteadiness on feet: Secondary | ICD-10-CM | POA: Diagnosis not present

## 2023-05-26 DIAGNOSIS — R2681 Unsteadiness on feet: Secondary | ICD-10-CM | POA: Diagnosis not present

## 2023-05-26 DIAGNOSIS — R41841 Cognitive communication deficit: Secondary | ICD-10-CM | POA: Diagnosis not present

## 2023-05-26 DIAGNOSIS — M6281 Muscle weakness (generalized): Secondary | ICD-10-CM | POA: Diagnosis not present

## 2023-05-26 DIAGNOSIS — Z9181 History of falling: Secondary | ICD-10-CM | POA: Diagnosis not present

## 2023-05-26 DIAGNOSIS — R278 Other lack of coordination: Secondary | ICD-10-CM | POA: Diagnosis not present

## 2023-05-27 DIAGNOSIS — R278 Other lack of coordination: Secondary | ICD-10-CM | POA: Diagnosis not present

## 2023-05-27 DIAGNOSIS — Z9181 History of falling: Secondary | ICD-10-CM | POA: Diagnosis not present

## 2023-05-27 DIAGNOSIS — M6281 Muscle weakness (generalized): Secondary | ICD-10-CM | POA: Diagnosis not present

## 2023-05-27 DIAGNOSIS — R2681 Unsteadiness on feet: Secondary | ICD-10-CM | POA: Diagnosis not present

## 2023-05-27 DIAGNOSIS — R41841 Cognitive communication deficit: Secondary | ICD-10-CM | POA: Diagnosis not present

## 2023-05-30 DIAGNOSIS — Z9181 History of falling: Secondary | ICD-10-CM | POA: Diagnosis not present

## 2023-05-30 DIAGNOSIS — R41841 Cognitive communication deficit: Secondary | ICD-10-CM | POA: Diagnosis not present

## 2023-05-30 DIAGNOSIS — R2681 Unsteadiness on feet: Secondary | ICD-10-CM | POA: Diagnosis not present

## 2023-05-30 DIAGNOSIS — R278 Other lack of coordination: Secondary | ICD-10-CM | POA: Diagnosis not present

## 2023-05-30 DIAGNOSIS — M6281 Muscle weakness (generalized): Secondary | ICD-10-CM | POA: Diagnosis not present

## 2023-05-31 ENCOUNTER — Other Ambulatory Visit: Payer: Self-pay | Admitting: Cardiovascular Disease

## 2023-05-31 DIAGNOSIS — R278 Other lack of coordination: Secondary | ICD-10-CM | POA: Diagnosis not present

## 2023-05-31 DIAGNOSIS — Z9181 History of falling: Secondary | ICD-10-CM | POA: Diagnosis not present

## 2023-05-31 DIAGNOSIS — R2681 Unsteadiness on feet: Secondary | ICD-10-CM | POA: Diagnosis not present

## 2023-05-31 DIAGNOSIS — R41841 Cognitive communication deficit: Secondary | ICD-10-CM | POA: Diagnosis not present

## 2023-05-31 DIAGNOSIS — M6281 Muscle weakness (generalized): Secondary | ICD-10-CM | POA: Diagnosis not present

## 2023-06-01 DIAGNOSIS — Z9181 History of falling: Secondary | ICD-10-CM | POA: Diagnosis not present

## 2023-06-01 DIAGNOSIS — M6281 Muscle weakness (generalized): Secondary | ICD-10-CM | POA: Diagnosis not present

## 2023-06-01 DIAGNOSIS — R41841 Cognitive communication deficit: Secondary | ICD-10-CM | POA: Diagnosis not present

## 2023-06-01 DIAGNOSIS — R2681 Unsteadiness on feet: Secondary | ICD-10-CM | POA: Diagnosis not present

## 2023-06-01 DIAGNOSIS — R278 Other lack of coordination: Secondary | ICD-10-CM | POA: Diagnosis not present

## 2023-06-02 DIAGNOSIS — R41841 Cognitive communication deficit: Secondary | ICD-10-CM | POA: Diagnosis not present

## 2023-06-02 DIAGNOSIS — Z9181 History of falling: Secondary | ICD-10-CM | POA: Diagnosis not present

## 2023-06-02 DIAGNOSIS — M6281 Muscle weakness (generalized): Secondary | ICD-10-CM | POA: Diagnosis not present

## 2023-06-02 DIAGNOSIS — R2681 Unsteadiness on feet: Secondary | ICD-10-CM | POA: Diagnosis not present

## 2023-06-02 DIAGNOSIS — R278 Other lack of coordination: Secondary | ICD-10-CM | POA: Diagnosis not present

## 2023-06-03 DIAGNOSIS — Z9181 History of falling: Secondary | ICD-10-CM | POA: Diagnosis not present

## 2023-06-03 DIAGNOSIS — M6281 Muscle weakness (generalized): Secondary | ICD-10-CM | POA: Diagnosis not present

## 2023-06-03 DIAGNOSIS — R278 Other lack of coordination: Secondary | ICD-10-CM | POA: Diagnosis not present

## 2023-06-03 DIAGNOSIS — R41841 Cognitive communication deficit: Secondary | ICD-10-CM | POA: Diagnosis not present

## 2023-06-03 DIAGNOSIS — R2681 Unsteadiness on feet: Secondary | ICD-10-CM | POA: Diagnosis not present

## 2023-06-06 ENCOUNTER — Other Ambulatory Visit: Payer: Self-pay | Admitting: *Deleted

## 2023-06-06 DIAGNOSIS — Z9181 History of falling: Secondary | ICD-10-CM | POA: Diagnosis not present

## 2023-06-06 DIAGNOSIS — R2681 Unsteadiness on feet: Secondary | ICD-10-CM | POA: Diagnosis not present

## 2023-06-06 DIAGNOSIS — M6281 Muscle weakness (generalized): Secondary | ICD-10-CM | POA: Diagnosis not present

## 2023-06-06 DIAGNOSIS — R41841 Cognitive communication deficit: Secondary | ICD-10-CM | POA: Diagnosis not present

## 2023-06-06 DIAGNOSIS — R278 Other lack of coordination: Secondary | ICD-10-CM | POA: Diagnosis not present

## 2023-06-06 MED ORDER — TAMOXIFEN CITRATE 20 MG PO TABS: 20.0000 mg | ORAL_TABLET | Freq: Every day | ORAL | 3 refills | Status: DC

## 2023-06-07 DIAGNOSIS — R41841 Cognitive communication deficit: Secondary | ICD-10-CM | POA: Diagnosis not present

## 2023-06-07 DIAGNOSIS — R278 Other lack of coordination: Secondary | ICD-10-CM | POA: Diagnosis not present

## 2023-06-07 DIAGNOSIS — M6281 Muscle weakness (generalized): Secondary | ICD-10-CM | POA: Diagnosis not present

## 2023-06-07 DIAGNOSIS — R2681 Unsteadiness on feet: Secondary | ICD-10-CM | POA: Diagnosis not present

## 2023-06-07 DIAGNOSIS — Z9181 History of falling: Secondary | ICD-10-CM | POA: Diagnosis not present

## 2023-06-08 DIAGNOSIS — R2681 Unsteadiness on feet: Secondary | ICD-10-CM | POA: Diagnosis not present

## 2023-06-08 DIAGNOSIS — M6281 Muscle weakness (generalized): Secondary | ICD-10-CM | POA: Diagnosis not present

## 2023-06-08 DIAGNOSIS — Z9181 History of falling: Secondary | ICD-10-CM | POA: Diagnosis not present

## 2023-06-08 DIAGNOSIS — R41841 Cognitive communication deficit: Secondary | ICD-10-CM | POA: Diagnosis not present

## 2023-06-08 DIAGNOSIS — R278 Other lack of coordination: Secondary | ICD-10-CM | POA: Diagnosis not present

## 2023-06-09 DIAGNOSIS — Z9181 History of falling: Secondary | ICD-10-CM | POA: Diagnosis not present

## 2023-06-09 DIAGNOSIS — R41841 Cognitive communication deficit: Secondary | ICD-10-CM | POA: Diagnosis not present

## 2023-06-09 DIAGNOSIS — R278 Other lack of coordination: Secondary | ICD-10-CM | POA: Diagnosis not present

## 2023-06-09 DIAGNOSIS — R2681 Unsteadiness on feet: Secondary | ICD-10-CM | POA: Diagnosis not present

## 2023-06-09 DIAGNOSIS — M6281 Muscle weakness (generalized): Secondary | ICD-10-CM | POA: Diagnosis not present

## 2023-06-10 DIAGNOSIS — R278 Other lack of coordination: Secondary | ICD-10-CM | POA: Diagnosis not present

## 2023-06-10 DIAGNOSIS — Z9181 History of falling: Secondary | ICD-10-CM | POA: Diagnosis not present

## 2023-06-10 DIAGNOSIS — M6281 Muscle weakness (generalized): Secondary | ICD-10-CM | POA: Diagnosis not present

## 2023-06-10 DIAGNOSIS — R41841 Cognitive communication deficit: Secondary | ICD-10-CM | POA: Diagnosis not present

## 2023-06-10 DIAGNOSIS — R2681 Unsteadiness on feet: Secondary | ICD-10-CM | POA: Diagnosis not present

## 2023-06-13 DIAGNOSIS — R41841 Cognitive communication deficit: Secondary | ICD-10-CM | POA: Diagnosis not present

## 2023-06-13 DIAGNOSIS — R2681 Unsteadiness on feet: Secondary | ICD-10-CM | POA: Diagnosis not present

## 2023-06-13 DIAGNOSIS — R278 Other lack of coordination: Secondary | ICD-10-CM | POA: Diagnosis not present

## 2023-06-13 DIAGNOSIS — Z9181 History of falling: Secondary | ICD-10-CM | POA: Diagnosis not present

## 2023-06-13 DIAGNOSIS — M6281 Muscle weakness (generalized): Secondary | ICD-10-CM | POA: Diagnosis not present

## 2023-06-17 DIAGNOSIS — M6281 Muscle weakness (generalized): Secondary | ICD-10-CM | POA: Diagnosis not present

## 2023-06-17 DIAGNOSIS — R41841 Cognitive communication deficit: Secondary | ICD-10-CM | POA: Diagnosis not present

## 2023-06-17 DIAGNOSIS — R278 Other lack of coordination: Secondary | ICD-10-CM | POA: Diagnosis not present

## 2023-06-17 DIAGNOSIS — Z9181 History of falling: Secondary | ICD-10-CM | POA: Diagnosis not present

## 2023-06-17 DIAGNOSIS — R2681 Unsteadiness on feet: Secondary | ICD-10-CM | POA: Diagnosis not present

## 2023-06-20 DIAGNOSIS — R2681 Unsteadiness on feet: Secondary | ICD-10-CM | POA: Diagnosis not present

## 2023-06-20 DIAGNOSIS — R41841 Cognitive communication deficit: Secondary | ICD-10-CM | POA: Diagnosis not present

## 2023-06-20 DIAGNOSIS — M6281 Muscle weakness (generalized): Secondary | ICD-10-CM | POA: Diagnosis not present

## 2023-06-20 DIAGNOSIS — Z9181 History of falling: Secondary | ICD-10-CM | POA: Diagnosis not present

## 2023-06-20 DIAGNOSIS — R278 Other lack of coordination: Secondary | ICD-10-CM | POA: Diagnosis not present

## 2023-06-21 DIAGNOSIS — M6281 Muscle weakness (generalized): Secondary | ICD-10-CM | POA: Diagnosis not present

## 2023-06-21 DIAGNOSIS — Z9181 History of falling: Secondary | ICD-10-CM | POA: Diagnosis not present

## 2023-06-21 DIAGNOSIS — R41841 Cognitive communication deficit: Secondary | ICD-10-CM | POA: Diagnosis not present

## 2023-06-21 DIAGNOSIS — R2681 Unsteadiness on feet: Secondary | ICD-10-CM | POA: Diagnosis not present

## 2023-06-21 DIAGNOSIS — R278 Other lack of coordination: Secondary | ICD-10-CM | POA: Diagnosis not present

## 2023-06-22 DIAGNOSIS — R278 Other lack of coordination: Secondary | ICD-10-CM | POA: Diagnosis not present

## 2023-06-22 DIAGNOSIS — M6281 Muscle weakness (generalized): Secondary | ICD-10-CM | POA: Diagnosis not present

## 2023-06-22 DIAGNOSIS — Z9181 History of falling: Secondary | ICD-10-CM | POA: Diagnosis not present

## 2023-06-22 DIAGNOSIS — R2681 Unsteadiness on feet: Secondary | ICD-10-CM | POA: Diagnosis not present

## 2023-06-22 DIAGNOSIS — R41841 Cognitive communication deficit: Secondary | ICD-10-CM | POA: Diagnosis not present

## 2023-06-23 ENCOUNTER — Other Ambulatory Visit: Payer: MEDICARE

## 2023-06-23 DIAGNOSIS — E7800 Pure hypercholesterolemia, unspecified: Secondary | ICD-10-CM | POA: Diagnosis not present

## 2023-06-23 DIAGNOSIS — I480 Paroxysmal atrial fibrillation: Secondary | ICD-10-CM | POA: Diagnosis not present

## 2023-06-23 DIAGNOSIS — I5032 Chronic diastolic (congestive) heart failure: Secondary | ICD-10-CM | POA: Diagnosis not present

## 2023-06-24 DIAGNOSIS — R278 Other lack of coordination: Secondary | ICD-10-CM | POA: Diagnosis not present

## 2023-06-24 DIAGNOSIS — R41841 Cognitive communication deficit: Secondary | ICD-10-CM | POA: Diagnosis not present

## 2023-06-24 DIAGNOSIS — Z9181 History of falling: Secondary | ICD-10-CM | POA: Diagnosis not present

## 2023-06-24 DIAGNOSIS — M6281 Muscle weakness (generalized): Secondary | ICD-10-CM | POA: Diagnosis not present

## 2023-06-24 DIAGNOSIS — R2681 Unsteadiness on feet: Secondary | ICD-10-CM | POA: Diagnosis not present

## 2023-06-24 LAB — COMPLETE METABOLIC PANEL WITH GFR
AG Ratio: 1.7 (calc) (ref 1.0–2.5)
ALT: 12 U/L (ref 6–29)
AST: 16 U/L (ref 10–35)
Albumin: 3.5 g/dL — ABNORMAL LOW (ref 3.6–5.1)
Alkaline phosphatase (APISO): 63 U/L (ref 37–153)
BUN/Creatinine Ratio: 36 (calc) — ABNORMAL HIGH (ref 6–22)
BUN: 41 mg/dL — ABNORMAL HIGH (ref 7–25)
CO2: 27 mmol/L (ref 20–32)
Calcium: 8.8 mg/dL (ref 8.6–10.4)
Chloride: 108 mmol/L (ref 98–110)
Creat: 1.14 mg/dL — ABNORMAL HIGH (ref 0.60–0.95)
Globulin: 2.1 g/dL (ref 1.9–3.7)
Glucose, Bld: 112 mg/dL — ABNORMAL HIGH (ref 65–99)
Potassium: 4.5 mmol/L (ref 3.5–5.3)
Sodium: 141 mmol/L (ref 135–146)
Total Bilirubin: 0.4 mg/dL (ref 0.2–1.2)
Total Protein: 5.6 g/dL — ABNORMAL LOW (ref 6.1–8.1)
eGFR: 43 mL/min/{1.73_m2} — ABNORMAL LOW (ref >=60)

## 2023-06-24 LAB — CBC WITH DIFFERENTIAL/PLATELET
Absolute Lymphocytes: 862 {cells}/uL (ref 850–3900)
Absolute Monocytes: 471 {cells}/uL (ref 200–950)
Basophils Absolute: 48 {cells}/uL (ref 0–200)
Basophils Relative: 1.1 %
Eosinophils Absolute: 62 {cells}/uL (ref 15–500)
Eosinophils Relative: 1.4 %
HCT: 35.3 % (ref 35.0–45.0)
Hemoglobin: 10.9 g/dL — ABNORMAL LOW (ref 11.7–15.5)
MCH: 26.4 pg — ABNORMAL LOW (ref 27.0–33.0)
MCHC: 30.9 g/dL — ABNORMAL LOW (ref 32.0–36.0)
MCV: 85.5 fL (ref 80.0–100.0)
MPV: 11.5 fL (ref 7.5–12.5)
Monocytes Relative: 10.7 %
Neutro Abs: 2957 {cells}/uL (ref 1500–7800)
Neutrophils Relative %: 67.2 %
Platelets: 187 10*3/uL (ref 140–400)
RBC: 4.13 10*6/uL (ref 3.80–5.10)
RDW: 15.8 % — ABNORMAL HIGH (ref 11.0–15.0)
Total Lymphocyte: 19.6 %
WBC: 4.4 10*3/uL (ref 3.8–10.8)

## 2023-06-24 LAB — LIPID PANEL
Cholesterol: 121 mg/dL (ref <200)
HDL: 47 mg/dL — ABNORMAL LOW (ref >=50)
LDL Cholesterol (Calc): 53 mg/dL
Non-HDL Cholesterol (Calc): 74 mg/dL (ref <130)
Total CHOL/HDL Ratio: 2.6 (calc) (ref <5.0)
Triglycerides: 120 mg/dL (ref <150)

## 2023-06-24 LAB — TSH: TSH: 3.84 m[IU]/L (ref 0.40–4.50)

## 2023-06-24 LAB — MAGNESIUM: Magnesium: 2.3 mg/dL (ref 1.5–2.5)

## 2023-06-27 DIAGNOSIS — M6281 Muscle weakness (generalized): Secondary | ICD-10-CM | POA: Diagnosis not present

## 2023-06-27 DIAGNOSIS — R2681 Unsteadiness on feet: Secondary | ICD-10-CM | POA: Diagnosis not present

## 2023-06-27 DIAGNOSIS — R41841 Cognitive communication deficit: Secondary | ICD-10-CM | POA: Diagnosis not present

## 2023-06-27 DIAGNOSIS — R278 Other lack of coordination: Secondary | ICD-10-CM | POA: Diagnosis not present

## 2023-06-27 DIAGNOSIS — Z9181 History of falling: Secondary | ICD-10-CM | POA: Diagnosis not present

## 2023-06-29 ENCOUNTER — Encounter: Payer: Self-pay | Admitting: Internal Medicine

## 2023-06-29 ENCOUNTER — Non-Acute Institutional Stay: Payer: MEDICARE | Admitting: Internal Medicine

## 2023-06-29 VITALS — BP 130/78 | HR 61 | Temp 97.8°F | Resp 16 | Ht 62.0 in | Wt 158.0 lb

## 2023-06-29 DIAGNOSIS — H9190 Unspecified hearing loss, unspecified ear: Secondary | ICD-10-CM

## 2023-06-29 DIAGNOSIS — I5032 Chronic diastolic (congestive) heart failure: Secondary | ICD-10-CM

## 2023-06-29 DIAGNOSIS — G3184 Mild cognitive impairment, so stated: Secondary | ICD-10-CM

## 2023-06-29 DIAGNOSIS — R600 Localized edema: Secondary | ICD-10-CM | POA: Diagnosis not present

## 2023-06-29 DIAGNOSIS — K219 Gastro-esophageal reflux disease without esophagitis: Secondary | ICD-10-CM

## 2023-06-29 DIAGNOSIS — R41841 Cognitive communication deficit: Secondary | ICD-10-CM | POA: Diagnosis not present

## 2023-06-29 DIAGNOSIS — R278 Other lack of coordination: Secondary | ICD-10-CM | POA: Diagnosis not present

## 2023-06-29 DIAGNOSIS — I10 Essential (primary) hypertension: Secondary | ICD-10-CM

## 2023-06-29 DIAGNOSIS — E7800 Pure hypercholesterolemia, unspecified: Secondary | ICD-10-CM | POA: Diagnosis not present

## 2023-06-29 DIAGNOSIS — I480 Paroxysmal atrial fibrillation: Secondary | ICD-10-CM

## 2023-06-29 DIAGNOSIS — Z9181 History of falling: Secondary | ICD-10-CM | POA: Diagnosis not present

## 2023-06-29 DIAGNOSIS — R2681 Unsteadiness on feet: Secondary | ICD-10-CM | POA: Diagnosis not present

## 2023-06-29 DIAGNOSIS — Z853 Personal history of malignant neoplasm of breast: Secondary | ICD-10-CM | POA: Diagnosis not present

## 2023-06-29 DIAGNOSIS — M6281 Muscle weakness (generalized): Secondary | ICD-10-CM | POA: Diagnosis not present

## 2023-06-29 NOTE — Progress Notes (Signed)
[facility]  [facility]  Clinic (12)  Provider:   Code Status: DNR Goals of Care:     06/29/2023   10:06 AM  Advanced Directives  Does Patient Have a Medical Advance Directive? Yes  Type of Advance Directive Living will;Out of facility DNR (pink MOST or yellow form)  Does patient want to make changes to medical advance directive? No - Patient declined     Chief Complaint  Patient presents with   Medical Management of Chronic Issues    Patient is being seen for 3 month follow up    HPI: Patient is a 89 y.o. female seen today for medical management of chronic diseases.   [place] [facility] [place] her [place]  Patient has a history of A-fib on Eliquis, history of bilateral breast cancer s/p lumpectomy and restarted back on tamoxifen  CKD GERD Worsening Cognition  Came by herself today her husband was not with her.  She her hearing aids were not working so she was not able to answer questions.  But seems like she continues to do well.  He is managing her medications Cough which started few days ago.  Lower extremity edema is better Denies shortness of breath Denies chest pain No fever Walking with her walker no falls Past Medical History:  Diagnosis Date   Allergic rhinitis, seasonal    Breast cancer of upper-outer quadrant of right female breast (HCC) 09/13/2014   Chronic venous insufficiency    Dermatophytosis of nail    Dysrhythmia    H/o Atrial fibrillation   Edema leg    Family history of malignant neoplasm of breast    Family history of malignant neoplasm of ovary    Goiter    right thyroid nodule    HTN (hypertension) 08/13/2017   HX: breast cancer    bilateral   Menopausal syndrome    Osteoarthritis    Osteoporosis    Squamous cell skin cancer    right lower leg   Wears glasses    Wears hearing aid    both ears    Past Surgical History:  Procedure Laterality Date   bilateral lumpectomies for breast cancer  Aug. 2007    BREAST LUMPECTOMY WITH RADIOACTIVE SEED LOCALIZATION Right 10/01/2014   Procedure: BREAST LUMPECTOMY WITH RADIOACTIVE SEED LOCALIZATION;  Surgeon: Harriette Bouillon, MD;  Location: [facility];  Service: General;  Laterality: Right;   BREAST LUMPECTOMY WITH RADIOACTIVE SEED LOCALIZATION Left 03/22/2019   Procedure: LEFT BREAST LUMPECTOMY WITH RADIOACTIVE SEED LOCALIZATION;  Surgeon: Harriette Bouillon, MD;  Location: [facility];  Service: General;  Laterality: Left;   BREAST LUMPECTOMY WITH RADIOACTIVE SEED LOCALIZATION Right 08/18/2021   Procedure: RIGHT BREAST LUMPECTOMY WITH RADIOACTIVE SEED LOCALIZATION;  Surgeon: Harriette Bouillon, MD;  Location: [facility];  Service: General;  Laterality: Right;   CATARACT EXTRACTION     CHOLECYSTECTOMY N/A 08/13/2017   Procedure: LAPAROSCOPIC CHOLECYSTECTOMY WITH INTRAOPERATIVE CHOLANGIOGRAM;  Surgeon: Glenna Fellows, MD;  Location: [facility];  Service: General;  Laterality: N/A;   DILATION AND CURETTAGE OF UTERUS     ESOPHAGOGASTRODUODENOSCOPY  07/26/2011   Procedure: ESOPHAGOGASTRODUODENOSCOPY (EGD);  Surgeon: Petra Kuba, MD;  Location: [facility];  Service: Endoscopy;  Laterality: N/A;   history of lumbar compression fracture     left eardum sx  1983   left knee arthroscopic surgery     no screening colonoscopy     s/p bilateral lumpectomies     SAVORY DILATION  07/26/2011   Procedure: SAVORY DILATION;  Surgeon: Petra Kuba, MD;  Location: [facility];  Service: Endoscopy;  Laterality: N/A;   status post resection squamous cell cancer right lower leg     TONSILLECTOMY     UMBILICAL HERNIA REPAIR      Allergies  Allergen Reactions   Celecoxib Swelling    Swelling-leg    Outpatient Encounter Medications as of 06/29/2023  Medication Sig   apixaban (ELIQUIS) 5 MG TABS tablet TAKE 1 TABLET BY MOUTH TWICE A DAY   atorvastatin (LIPITOR) 40 MG tablet TAKE 1 TABLET BY MOUTH EVERY DAY   famotidine (PEPCID) 20 MG tablet Take 20 mg by mouth 2 (two) times  daily.   furosemide (LASIX) 40 MG tablet TAKE 1.5 TABLETS BY MOUTH DAILY.   ipratropium (ATROVENT) 0.06 % nasal spray 2 sprays each nostril every 6 (SIX) hours to dry up nose.   Magnesium Oxide 250 MG TABS Take 1 tablet (250 mg total) by mouth daily.   metoprolol succinate (TOPROL-XL) 25 MG 24 hr tablet TAKE 1 TABLET BY MOUTH EVERYDAY AT BEDTIME   montelukast (SINGULAIR) 10 MG tablet TAKE 1 TABLET BY MOUTH AT NIGHT FOR COUGH OR WHEEZING   Multiple Vitamins-Minerals (PRESERVISION AREDS 2) CAPS Take 1 capsule by mouth 2 (two) times daily.    potassium chloride SA (KLOR-CON M) 20 MEQ tablet Take 1 tablet (20 mEq total) by mouth 2 (two) times daily.   tamoxifen (NOLVADEX) 20 MG tablet Take 1 tablet (20 mg total) by mouth daily.   No facility-administered encounter medications on file as of 06/29/2023.    Review of Systems:  Review of Systems  Constitutional:  Negative for activity change and appetite change.  HENT:  Positive for congestion, hearing loss and rhinorrhea.   Respiratory:  Positive for cough. Negative for shortness of breath.   Cardiovascular:  Positive for leg swelling.  Gastrointestinal:  Negative for constipation.  Genitourinary: Negative.   Musculoskeletal:  Positive for gait problem. Negative for arthralgias and myalgias.  Skin: Negative.   Neurological:  Negative for dizziness and weakness.  Psychiatric/Behavioral:  Positive for confusion. Negative for dysphoric mood and sleep disturbance.     Health Maintenance  Topic Date Due   Zoster Vaccines- Shingrix (1 of 2) 12/17/1940   INFLUENZA VACCINE  02/17/2023   COVID-19 Vaccine (5 - 2023-24 season) 03/20/2023   Medicare Annual Wellness (AWV)  01/03/2024   Pneumonia Vaccine 76+ Years old  Completed   DEXA SCAN  Completed   HPV VACCINES  Aged Out   DTaP/Tdap/Td  Discontinued   MAMMOGRAM  Discontinued    Physical Exam: Vitals:   06/29/23 1004  BP: 130/78  Pulse: 61  Resp: 16  Temp: 97.8 F (36.6 C)  TempSrc:  Temporal  SpO2: 97%  Weight: 158 lb (71.7 kg)  Height: 5\' 2"  (1.575 m)   Body mass index is 28.9 kg/m. Physical Exam Vitals reviewed.  Constitutional:      Appearance: Normal appearance.  HENT:     Head: Normocephalic.     Nose: Congestion present.     Mouth/Throat:     Mouth: Mucous membranes are moist.     Pharynx: Oropharynx is clear.  Eyes:     Pupils: Pupils are equal, round, and reactive to light.  Cardiovascular:     Rate and Rhythm: Normal rate and regular rhythm.     Pulses: Normal pulses.     Heart sounds: Murmur heard.  Pulmonary:     Effort: Pulmonary effort  is normal.     Breath sounds: Normal breath sounds.  Abdominal:     General: Abdomen is flat. Bowel sounds are normal.     Palpations: Abdomen is soft.  Musculoskeletal:        General: Swelling present.     Cervical back: Neck supple.     Comments: Moderate swelling with Redness Bilateral  Skin:    General: Skin is warm.  Neurological:     General: No focal deficit present.     Mental Status: She is alert.  Psychiatric:        Mood and Affect: Mood normal.        Thought Content: Thought content normal.     Labs reviewed: Basic Metabolic Panel: Recent Labs    11/08/22 0807 11/27/22 1456 11/28/22 0657 12/27/22 0803 03/10/23 1557 03/24/23 0810 06/23/23 0831  NA 143   < > 142  --  141 143 141  K 3.7   < > 3.4*  --  4.0 4.4 4.5  CL 107   < > 110  --  112* 108 108  CO2 27   < > 22  --  20* 23 27  GLUCOSE 123*   < > 123*  --  125* 105* 112*  BUN 41*   < > 34*  --  42* 43* 41*  CREATININE 1.22*   < > 1.04*  --  1.10* 1.17* 1.14*  CALCIUM 8.6   < > 8.1*  --  8.0* 8.5* 8.8  MG  --    < > 2.1  --  1.5*  --  2.3  TSH 6.70*  --   --  6.33*  --   --  3.84   < > = values in this interval not displayed.   Liver Function Tests: Recent Labs    07/22/22 1546 11/08/22 0807 11/27/22 1456 03/10/23 1557 03/24/23 0810 06/23/23 0831  AST 16   < > 21 19 16 16   ALT 13   < > 15 11 12 12   ALKPHOS 74   --  64 71  --   --   BILITOT 0.4   < > 0.5 0.2* 0.3 0.4  PROT 6.1*   < > 5.7* 5.6* 5.8* 5.6*  ALBUMIN 3.8  --  3.0* 2.9*  --   --    < > = values in this interval not displayed.   Recent Labs    11/27/22 1456  LIPASE 29   No results for input(s): "AMMONIA" in the last 8760 hours. CBC: Recent Labs    11/08/22 0807 11/27/22 1456 03/10/23 1557 03/24/23 0810 06/23/23 0831  WBC 5.2   < > 6.2 5.6 4.4  NEUTROABS 3,765  --   --  3,976 2,957  HGB 11.5*   < > 9.9* 10.2* 10.9*  HCT 35.9   < > 33.1* 32.3* 35.3  MCV 86.1   < > 86.9 82.8 85.5  PLT 200   < > 162 213 187   < > = values in this interval not displayed.   Lipid Panel: Recent Labs    11/08/22 0807 06/23/23 0831  CHOL 124 121  HDL 47* 47*  LDLCALC 56 53  TRIG 128 120  CHOLHDL 2.6 2.6   Lab Results  Component Value Date   HGBA1C 5.8 (H) 01/01/2021    Procedures since last visit: No results found.  Assessment/Plan 1. Paroxysmal atrial fibrillation (HCC) Eliquis and Metoprolol No Recent Palpitations Magnesium stable  2. Bilateral leg edema On  Lasix  3. Chronic diastolic heart failure (HCC) Lasix  4. Hypomagnesemia On Supplement Magnesium good level  5. Gastroesophageal reflux disease, unspecified whether esophagitis present Prilosec changed to Pepcid to see if it helps her Magnesium  6. BREAST CANCER, HX OF Tamoxifen  Follow up Mammogram  7. Essential hypertension Metoprolol  8. Hypercholesterolemia LDL good on statin  9. Mild cognitive impairment Couldn't do MMSE today as she was not able to understand much due to [facility]  10. HOH (hard of hearing)     Labs/tests ordered:  * No order type specified * Next appt:  Visit date not found

## 2023-06-29 NOTE — Patient Instructions (Signed)
Can take Mucinex twice a day for Cough  Let us know if does not get better

## 2023-06-30 ENCOUNTER — Ambulatory Visit (INDEPENDENT_AMBULATORY_CARE_PROVIDER_SITE_OTHER): Payer: MEDICARE | Admitting: Podiatry

## 2023-06-30 DIAGNOSIS — D689 Coagulation defect, unspecified: Secondary | ICD-10-CM

## 2023-06-30 DIAGNOSIS — D2371 Other benign neoplasm of skin of right lower limb, including hip: Secondary | ICD-10-CM

## 2023-06-30 DIAGNOSIS — B351 Tinea unguium: Secondary | ICD-10-CM

## 2023-06-30 DIAGNOSIS — M79676 Pain in unspecified toe(s): Secondary | ICD-10-CM | POA: Diagnosis not present

## 2023-06-30 DIAGNOSIS — D2372 Other benign neoplasm of skin of left lower limb, including hip: Secondary | ICD-10-CM

## 2023-06-30 NOTE — Progress Notes (Signed)
She presents today chief complaint of painful elongated toenails and calluses bilater al  Objective: Vital signs are stable alert oriented x 3.  Toenails are long thick yellow dystrophic with mycotic multiple benign skin lesions forefoot bilateral.  No open lesions or wounds are noted.  Assessment: Pain in limb secondary to onychomycosis 1 through 5 bilateral.  Painful benign soft tissue lesions bilateral.  Plan: Debridement of toenails 1 through 5 bilateral debridement of benign skin lesions.  Follow-up with her in 3 months

## 2023-07-01 DIAGNOSIS — R278 Other lack of coordination: Secondary | ICD-10-CM | POA: Diagnosis not present

## 2023-07-01 DIAGNOSIS — R41841 Cognitive communication deficit: Secondary | ICD-10-CM | POA: Diagnosis not present

## 2023-07-01 DIAGNOSIS — R2681 Unsteadiness on feet: Secondary | ICD-10-CM | POA: Diagnosis not present

## 2023-07-01 DIAGNOSIS — Z9181 History of falling: Secondary | ICD-10-CM | POA: Diagnosis not present

## 2023-07-01 DIAGNOSIS — M6281 Muscle weakness (generalized): Secondary | ICD-10-CM | POA: Diagnosis not present

## 2023-07-04 DIAGNOSIS — Z9181 History of falling: Secondary | ICD-10-CM | POA: Diagnosis not present

## 2023-07-04 DIAGNOSIS — R41841 Cognitive communication deficit: Secondary | ICD-10-CM | POA: Diagnosis not present

## 2023-07-04 DIAGNOSIS — M6281 Muscle weakness (generalized): Secondary | ICD-10-CM | POA: Diagnosis not present

## 2023-07-04 DIAGNOSIS — R2681 Unsteadiness on feet: Secondary | ICD-10-CM | POA: Diagnosis not present

## 2023-07-04 DIAGNOSIS — R278 Other lack of coordination: Secondary | ICD-10-CM | POA: Diagnosis not present

## 2023-07-05 ENCOUNTER — Inpatient Hospital Stay: Payer: MEDICARE | Attending: Hematology and Oncology | Admitting: Hematology and Oncology

## 2023-07-05 VITALS — BP 139/64 | HR 76 | Temp 97.9°F | Resp 16 | Wt 159.4 lb

## 2023-07-05 DIAGNOSIS — R5383 Other fatigue: Secondary | ICD-10-CM | POA: Diagnosis not present

## 2023-07-05 DIAGNOSIS — Z9049 Acquired absence of other specified parts of digestive tract: Secondary | ICD-10-CM | POA: Diagnosis not present

## 2023-07-05 DIAGNOSIS — Z79899 Other long term (current) drug therapy: Secondary | ICD-10-CM | POA: Diagnosis not present

## 2023-07-05 DIAGNOSIS — C50412 Malignant neoplasm of upper-outer quadrant of left female breast: Secondary | ICD-10-CM | POA: Insufficient documentation

## 2023-07-05 DIAGNOSIS — Z8041 Family history of malignant neoplasm of ovary: Secondary | ICD-10-CM | POA: Insufficient documentation

## 2023-07-05 DIAGNOSIS — Z803 Family history of malignant neoplasm of breast: Secondary | ICD-10-CM | POA: Insufficient documentation

## 2023-07-05 DIAGNOSIS — Z8249 Family history of ischemic heart disease and other diseases of the circulatory system: Secondary | ICD-10-CM | POA: Diagnosis not present

## 2023-07-05 DIAGNOSIS — Z79810 Long term (current) use of selective estrogen receptor modulators (SERMs): Secondary | ICD-10-CM | POA: Insufficient documentation

## 2023-07-05 DIAGNOSIS — I10 Essential (primary) hypertension: Secondary | ICD-10-CM | POA: Diagnosis not present

## 2023-07-05 DIAGNOSIS — C50411 Malignant neoplasm of upper-outer quadrant of right female breast: Secondary | ICD-10-CM | POA: Insufficient documentation

## 2023-07-05 DIAGNOSIS — Z170 Estrogen receptor positive status [ER+]: Secondary | ICD-10-CM | POA: Insufficient documentation

## 2023-07-05 MED ORDER — TAMOXIFEN CITRATE 20 MG PO TABS: 20.0000 mg | ORAL_TABLET | Freq: Every day | ORAL | 3 refills | Status: AC

## 2023-07-05 NOTE — Progress Notes (Signed)
[facility]  [phone] [phone]     ID: [name] DOB: [birth year]  MR#: [id number]  JYN#:[id number]  Patient Care Team: Mahlon Gammon, MD as PCP - General (Internal Medicine) Thurmon Fair, MD as PCP - Cardiology (Cardiology) Harriette Bouillon, MD as Consulting Physician (General Surgery) Chipper Herb, MD (Inactive) as Consulting Physician (Radiation Oncology) Pershing Proud, RN as Registered Nurse Donnelly Angelica, RN as Registered Nurse Vida Rigger, MD as Consulting Physician (Gastroenterology) Rachel Moulds, MD as Consulting Physician (Hematology and Oncology) Duke, Roe Ruther[place], [place] as Physician Assistant (Cardiology)  CHIEF COMPLAINT: Estrogen receptor positive breast cancer  CURRENT TREATMENT: observation  INTERVAL HISTORY:  [name] returns today for for follow-up of her more recent left breast cancer with her friend today.   She is taking tamoxifen as prescribed.  She is tolerating this very well.  She reports some fatigue but otherwise no new hot flashes, vaginal wetness or vaginal bleeding. She has not noticed any changes in her breast.  She is hard of hearing.    Rest of the pertinent 10 point review of systems reviewed and negative.  REVIEW OF SYSTEMS:  A detailed review of systems today was benign.   COVID 19 VACCINATION STATUS: Moderna x4, last 12/2020   BREAST CANCER HISTORY: From the earlier summary note:  I saw [name] previously for a history of bilateral breast cancers, treated with bilateral lumpectomies and anti-estrogens for 5 years. She did not receive radiation treatments. She was last seen here in 2011.  She had her annual screening mammography at [facility] 08/27/2013, and this showed the breast density to be category B. There was an area of new grouped heterogeneous calcifications in the right breast at the 11:00 middle depth. A focal asymmetric density anterior to that area was seen in one view only. Additional  views were recommended, but if they were performed I do not have those records. There appears to have been mammography on 03/05/2014, but again I do not have those records.  On 08/30/2014, bilateral diagnostic mammography with tomosynthesis this was obtained. The calcifications at the 11:00 position in the right breast were increased in number. There were no other significant findings. Biopsy of the area in question to 20 11/06/2014 showed ([id number]) high-grade ductal carcinoma in situ, with mucin extravasation.no definitive invasive component was seen however. This high-grade noninvasive tumor was estrogen receptor positive at 100%, progesterone receptor positive at 44%, both with strong staining intensity.  Her subsequent history is as detailed below   PAST MEDICAL HISTORY: Past Medical History:  Diagnosis Date   Allergic rhinitis, seasonal    Breast cancer of upper-outer quadrant of right female breast (HCC) 09/13/2014   Chronic venous insufficiency    Dermatophytosis of nail    Dysrhythmia    H/o Atrial fibrillation   Edema leg    Family history of malignant neoplasm of breast    Family history of malignant neoplasm of ovary    Goiter    right thyroid nodule    HTN (hypertension) 08/13/2017   HX: breast cancer    bilateral   Menopausal syndrome    Osteoarthritis    Osteoporosis    Squamous cell skin cancer    right lower leg   Wears glasses    Wears hearing aid    both ears    PAST SURGICAL HISTORY: Past Surgical History:  Procedure Laterality Date   bilateral lumpectomies for breast cancer  Aug. 2007   BREAST LUMPECTOMY WITH RADIOACTIVE  SEED LOCALIZATION Right 10/01/2014   Procedure: BREAST LUMPECTOMY WITH RADIOACTIVE SEED LOCALIZATION;  Surgeon: Harriette Bouillon, MD;  Location: [facility];  Service: General;  Laterality: Right;   BREAST LUMPECTOMY WITH RADIOACTIVE SEED LOCALIZATION Left 03/22/2019   Procedure: LEFT BREAST LUMPECTOMY WITH RADIOACTIVE SEED  LOCALIZATION;  Surgeon: Harriette Bouillon, MD;  Location: [facility];  Service: General;  Laterality: Left;   BREAST LUMPECTOMY WITH RADIOACTIVE SEED LOCALIZATION Right 08/18/2021   Procedure: RIGHT BREAST LUMPECTOMY WITH RADIOACTIVE SEED LOCALIZATION;  Surgeon: Harriette Bouillon, MD;  Location: [facility];  Service: General;  Laterality: Right;   CATARACT EXTRACTION     CHOLECYSTECTOMY N/A 08/13/2017   Procedure: LAPAROSCOPIC CHOLECYSTECTOMY WITH INTRAOPERATIVE CHOLANGIOGRAM;  Surgeon: Glenna Fellows, MD;  Location: [facility];  Service: General;  Laterality: N/A;   DILATION AND CURETTAGE OF UTERUS     ESOPHAGOGASTRODUODENOSCOPY  07/26/2011   Procedure: ESOPHAGOGASTRODUODENOSCOPY (EGD);  Surgeon: Petra Kuba, MD;  Location: [facility];  Service: Endoscopy;  Laterality: N/A;   history of lumbar compression fracture     left eardum sx  1983   left knee arthroscopic surgery     no screening colonoscopy     s/p bilateral lumpectomies     SAVORY DILATION  07/26/2011   Procedure: SAVORY DILATION;  Surgeon: Petra Kuba, MD;  Location: [facility];  Service: Endoscopy;  Laterality: N/A;   status post resection squamous cell cancer right lower leg     TONSILLECTOMY     UMBILICAL HERNIA REPAIR      FAMILY HISTORY Family History  Problem Relation Age of Onset   Cancer Mother 49       breast   Heart attack Father    Heart attack Brother    Heart attack Brother    Cancer Maternal Aunt 61       breast   Coronary artery disease Other    Cancer Other        ovarian; daughter of mat aunt w/ BC in 82s   Heart disease Brother    Cancer Maternal Aunt 78       breast  the patient's father died at age 33 from a heart attack. The patient's mother died at age 44. The patient's mother was one of 8 sisters, 9 siblings. 2 of the sisters had breast cancer, postmenopausal. [place]ard self had 2 half-brothers and 1 full brother as well as one half-sister.There is no history of ovarian cancer in the familyexcept as  noted.   GYNECOLOGIC HISTORY:  No LMP recorded. Patient is postmenopausal. Menarche age 10, the patient is GX P0. 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 Her husband [name] is a former Psychologist, educational. They currently reside [place], with no pets.    ADVANCED DIRECTIVES: in place   HEALTH MAINTENANCE: Social History   Tobacco Use   Smoking status: Never   Smokeless tobacco: Never  Vaping Use   Vaping status: Never Used  Substance Use Topics   Alcohol use: No   Drug use: No     Colonoscopy:  PAP:  Bone density:  Lipid panel:  Allergies  Allergen Reactions   Celecoxib Swelling    Swelling-leg    Current Outpatient Medications  Medication Sig Dispense Refill   apixaban (ELIQUIS) 5 MG TABS tablet TAKE 1 TABLET BY MOUTH TWICE A DAY 60 tablet 9  atorvastatin (LIPITOR) 40 MG tablet TAKE 1 TABLET BY MOUTH EVERY DAY 90 tablet 1   famotidine (PEPCID) 20 MG tablet Take 20 mg by mouth 2 (two) times daily.     furosemide (LASIX) 40 MG tablet TAKE 1.5 TABLETS BY MOUTH DAILY. 135 tablet 3   ipratropium (ATROVENT) 0.06 % nasal spray 2 sprays each nostril every 6 (SIX) hours to dry up nose. 15 mL 10   Magnesium Oxide 250 MG TABS Take 1 tablet (250 mg total) by mouth daily.     metoprolol succinate (TOPROL-XL) 25 MG 24 hr tablet TAKE 1 TABLET BY MOUTH EVERYDAY AT BEDTIME 90 tablet 2   montelukast (SINGULAIR) 10 MG tablet TAKE 1 TABLET BY MOUTH AT NIGHT FOR COUGH OR WHEEZING 90 tablet 1   Multiple Vitamins-Minerals (PRESERVISION AREDS 2) CAPS Take 1 capsule by mouth 2 (two) times daily.      potassium chloride SA (KLOR-CON M) 20 MEQ tablet Take 1 tablet (20 mEq total) by mouth 2 (two) times daily. 90 tablet 3   tamoxifen (NOLVADEX) 20 MG tablet Take 1 tablet (20 mg total) by mouth daily. 90 tablet 3   No current  facility-administered medications for this visit.    OBJECTIVE: white woman using a walker  Vitals:   07/05/23 1226  BP: 139/64  Pulse: 76  Resp: 16  Temp: 97.9 F (36.6 C)  SpO2: 96%      Body mass index is 29.15 kg/m.    ECOG FS:2 - Symptomatic, <50% confined to bed  Sclerae unicteric, EOMs intact Breasts: Bilateral breasts inspected and palpated.  No new masses noted.  No regional adenopathy Lower extremities: She is wearing compression socks today, no new edema. She has some baseline edema which is stable.  LAB RESULTS:  CMP     Component Value Date/Time   NA 141 06/23/2023 0831   NA 142 05/12/2022 1532   NA 145 09/19/2017 0000   NA 145 09/18/2014 1245   K 4.5 06/23/2023 0831   K 3.7 09/19/2017 0000   K 3.9 09/18/2014 1245   CL 108 06/23/2023 0831   CO2 27 06/23/2023 0831   CO2 25 09/18/2014 1245   GLUCOSE 112 (H) 06/23/2023 0831   GLUCOSE 107 09/18/2014 1245   BUN 41 (H) 06/23/2023 0831   BUN 35 05/12/2022 1532   BUN 20.1 09/18/2014 1245   CREATININE 1.14 (H) 06/23/2023 0831   CREATININE 1.2 (H) 09/18/2014 1245   CALCIUM 8.8 06/23/2023 0831   CALCIUM 7.3 09/19/2017 0000   CALCIUM 9.1 09/18/2014 1245   PROT 5.6 (L) 06/23/2023 0831   PROT 5.1 09/19/2017 0000   PROT 6.8 09/18/2014 1245   ALBUMIN 2.9 (L) 03/10/2023 1557   ALBUMIN 2.8 09/19/2017 0000   ALBUMIN 3.8 09/18/2014 1245   AST 16 06/23/2023 0831   AST 14 (L) 09/15/2021 1255   AST 14 09/18/2014 1245   ALT 12 06/23/2023 0831   ALT 12 09/15/2021 1255   ALT 23 09/19/2017 0000   ALT 8 09/18/2014 1245   ALKPHOS 71 03/10/2023 1557   ALKPHOS 87 09/19/2017 0000   ALKPHOS 100 09/18/2014 1245   BILITOT 0.4 06/23/2023 0831   BILITOT 0.4 09/15/2021 1255   BILITOT 0.49 09/18/2014 1245   GFRNONAA 45 (L) 03/10/2023 1557   GFRNONAA 43 (L) 09/15/2021 1255   GFRNONAA 38 (L) 01/21/2021 0907   GFRAA 44 (L) 01/21/2021 0907    INo results found for: "SPEP", "UPEP"  Lab Results  Component Value Date   WBC  4.4 06/23/2023   NEUTROABS 2,957 06/23/2023   HGB 10.9 (L) 06/23/2023   HCT 35.3 06/23/2023   MCV 85.5 06/23/2023   PLT 187 06/23/2023      Chemistry      Component Value Date/Time   NA 141 06/23/2023 0831   NA 142 05/12/2022 1532   NA 145 09/19/2017 0000   NA 145 09/18/2014 1245   K 4.5 06/23/2023 0831   K 3.7 09/19/2017 0000   K 3.9 09/18/2014 1245   CL 108 06/23/2023 0831   CO2 27 06/23/2023 0831   CO2 25 09/18/2014 1245   BUN 41 (H) 06/23/2023 0831   BUN 35 05/12/2022 1532   BUN 20.1 09/18/2014 1245   CREATININE 1.14 (H) 06/23/2023 0831   CREATININE 1.2 (H) 09/18/2014 1245   GLU 123 07/09/2019 0000      Component Value Date/Time   CALCIUM 8.8 06/23/2023 0831   CALCIUM 7.3 09/19/2017 0000   CALCIUM 9.1 09/18/2014 1245   ALKPHOS 71 03/10/2023 1557   ALKPHOS 87 09/19/2017 0000   ALKPHOS 100 09/18/2014 1245   AST 16 06/23/2023 0831   AST 14 (L) 09/15/2021 1255   AST 14 09/18/2014 1245   ALT 12 06/23/2023 0831   ALT 12 09/15/2021 1255   ALT 23 09/19/2017 0000   ALT 8 09/18/2014 1245   BILITOT 0.4 06/23/2023 0831   BILITOT 0.4 09/15/2021 1255   BILITOT 0.49 09/18/2014 1245       Lab Results  Component Value Date   LABCA2 34 05/20/2011    No components found for: "LABCA125"  No results for input(s): "INR" in the last 168 hours.  Urinalysis    Component Value Date/Time   COLORURINE YELLOW 08/12/2017 0627   APPEARANCEUR Clear 05/12/2022 1532   LABSPEC 1.014 08/12/2017 0627   PHURINE 6.0 08/12/2017 0627   GLUCOSEU Negative 05/12/2022 1532   HGBUR SMALL (A) 08/12/2017 0627   HGBUR trace-intact 10/28/2009 0905   BILIRUBINUR Negative 05/12/2022 1532   KETONESUR NEGATIVE 08/12/2017 0627   PROTEINUR Negative 05/12/2022 1532   PROTEINUR NEGATIVE 08/12/2017 0627   UROBILINOGEN 0.2 10/28/2009 0905   NITRITE Negative 05/12/2022 1532   NITRITE NEGATIVE 08/12/2017 0627   LEUKOCYTESUR 1+ (A) 05/12/2022 1532    STUDIES: No results found.    ASSESSMENT:  89 y.o. BRCA negative [facility] [place] resident   (1) status post bilateral lumpectomies without sentinel lymph node biopsy in August 2007 for a right-sided 7 mm, grade 2, invasive ductal carcinoma, and a left-sided 8 mm, grade 1, invasive ductal carcinoma.  Both tumors were ER positive, HER-2/neu negative, both with a low proliferation fraction.   (a) On Arimidex from September 2007 until May 2009, at which time we switched to tamoxifen primarily secondary to concerns regarding osteoporosis, completing five years of antiestrogen September 2011.   (b)  Also received Zometa yearly, discontinued after 4 doses in 2011  (2)  Status post right breast upper outer quadrant biopsy 09/11/2014 4 a 4.2 cm area of ductal carcinoma in situ, grade 2 or 3,  with extravasated mucin, estrogen and progesterone receptor positive   (3) status post right lumpectomy 10/01/2014 for a pT1a pNX, stage IA invasive ductal carcinoma, grade 2, estrogen  and progesterone receptor positive, HER-2 negative, with an MIB-1 of 38%. Margins were close but negative   (4) she did not need adjuvant radiation   (5) opted against anti-estrogens  (a)  DEXA scan 10/31/2014 shows a T score of -1.8.  (6)  the BreastNext  gene panel performed at [facility] 09/19/2014 showed no deleterious mutations in ATM, BARD1, BRCA1, BRCA2, BRIP1, CDH1, CHEK2, MRE11A, MUTYH, NBN, NF1, PALB2, PTEN, RAD50, RAD51C, RAD51D, or TP53.  (7) status post left lumpectomy 03/28/2019 for a pT1c pNX, stage IA invasive ductal carcinoma, with negative margins; the tumor was strongly estrogen and progesterone receptor positive, HER-2 not amplified, with an MIB-1 of 15%.  (8) she is status post  right lumpectomy on 08/18/2021, final pathology showed grade 2 IDC measuring 4 mm, DCIS, low to intermediate grade, resection margins are negative for invasive carcinoma, closest anterior margin at 2 mm prognostic showed ER 95% strong intensity, PR 95% strong intensity, Ki-67 of  less than 1% and HER2 negative.   PLAN    Total encounter time .*   *Total Encounter Time as defined by the Centers for Medicare and Medicaid Services includes, in addition to the face-to-face time of a patient visit (documented in the note above) non-face-to-face time: obtaining and reviewing outside history, ordering and reviewing medications, tests or procedures, care coordination (communications with other health care professionals or caregivers) and documentation in the medical record.  Rachel Moulds MD

## 2023-07-05 NOTE — Progress Notes (Signed)
[facility]  Telephone:([phone] Fax:([phone]) [phone]     ID: ASJHA SAMUEL DOB: 1922/02/12  MR#: [id number]  KVQ#:[id number]  Patient Care Team: Mahlon Gammon, MD as PCP - General (Internal Medicine) Thurmon Fair, MD as PCP - Cardiology (Cardiology) Harriette Bouillon, MD as Consulting Physician (General Surgery) Chipper Herb, MD (Inactive) as Consulting Physician (Radiation Oncology) Pershing Proud, RN as Registered Nurse Donnelly Angelica, RN as Registered Nurse Vida Rigger, MD as Consulting Physician (Gastroenterology) Rachel Moulds, MD as Consulting Physician (Hematology and Oncology) Duke, Roe Rutherford, [place] as Physician Assistant (Cardiology)  CHIEF COMPLAINT: Estrogen receptor positive breast cancer  CURRENT TREATMENT: observation  INTERVAL HISTORY:  Discussed the use of AI scribe software for clinical note transcription with the patient, who gave verbal consent to proceed.  History of Present Illness    The patient, with a history of recurrent breast cancer, presents with congestion. She denies sore throat, fever, and other symptoms. She has been exposed to a family member with walking pneumonia and is concerned about possible infection. She is currently taking tamoxifen for breast cancer and has been compliant with the medication. She has a scheduled mammogram at [facility], which she has not yet completed. She also reports some memory issues, but denies significant changes in personality or ability to perform daily tasks. She has a therapist who works with her on memory issues.  Rest of the pertinent 10 point review of systems reviewed and negative.  REVIEW OF SYSTEMS:  A detailed review of systems today was benign.   COVID 19 VACCINATION STATUS: Moderna x4, last 12/2020   BREAST CANCER HISTORY: From the earlier summary note:  I saw Ayani previously for a history of bilateral breast cancers, treated with bilateral lumpectomies and anti-estrogens for  5 years. She did not receive radiation treatments. She was last seen here in 2011.  She had her annual screening mammography at [facility] 08/27/2013, and this showed the breast density to be category B. There was an area of new grouped heterogeneous calcifications in the right breast at the 11:00 middle depth. A focal asymmetric density anterior to that area was seen in one view only. Additional views were recommended, but if they were performed I do not have those records. There appears to have been mammography on 03/05/2014, but again I do not have those records.  On 08/30/2014, bilateral diagnostic mammography with tomosynthesis this was obtained. The calcifications at the 11:00 position in the right breast were increased in number. There were no other significant findings. Biopsy of the area in question to 20 11/06/2014 showed ([id number]) high-grade ductal carcinoma in situ, with mucin extravasation.no definitive invasive component was seen however. This high-grade noninvasive tumor was estrogen receptor positive at 100%, progesterone receptor positive at 44%, both with strong staining intensity.  Her subsequent history is as detailed below   PAST MEDICAL HISTORY: Past Medical History:  Diagnosis Date   Allergic rhinitis, seasonal    Breast cancer of upper-outer quadrant of right female breast (HCC) 09/13/2014   Chronic venous insufficiency    Dermatophytosis of nail    Dysrhythmia    H/o Atrial fibrillation   Edema leg    Family history of malignant neoplasm of breast    Family history of malignant neoplasm of ovary    Goiter    right thyroid nodule    HTN (hypertension) 08/13/2017   HX: breast cancer    bilateral   Menopausal syndrome    Osteoarthritis    Osteoporosis  Squamous cell skin cancer    right lower leg   Wears glasses    Wears hearing aid    both ears    PAST SURGICAL HISTORY: Past Surgical History:  Procedure Laterality Date   bilateral lumpectomies for breast  cancer  Aug. 2007   BREAST LUMPECTOMY WITH RADIOACTIVE SEED LOCALIZATION Right 10/01/2014   Procedure: BREAST LUMPECTOMY WITH RADIOACTIVE SEED LOCALIZATION;  Surgeon: Harriette Bouillon, MD;  Location: [facility];  Service: General;  Laterality: Right;   BREAST LUMPECTOMY WITH RADIOACTIVE SEED LOCALIZATION Left 03/22/2019   Procedure: LEFT BREAST LUMPECTOMY WITH RADIOACTIVE SEED LOCALIZATION;  Surgeon: Harriette Bouillon, MD;  Location: [facility];  Service: General;  Laterality: Left;   BREAST LUMPECTOMY WITH RADIOACTIVE SEED LOCALIZATION Right 08/18/2021   Procedure: RIGHT BREAST LUMPECTOMY WITH RADIOACTIVE SEED LOCALIZATION;  Surgeon: Harriette Bouillon, MD;  Location: [facility];  Service: General;  Laterality: Right;   CATARACT EXTRACTION     CHOLECYSTECTOMY N/A 08/13/2017   Procedure: LAPAROSCOPIC CHOLECYSTECTOMY WITH INTRAOPERATIVE CHOLANGIOGRAM;  Surgeon: Glenna Fellows, MD;  Location: [facility];  Service: General;  Laterality: N/A;   DILATION AND CURETTAGE OF UTERUS     ESOPHAGOGASTRODUODENOSCOPY  07/26/2011   Procedure: ESOPHAGOGASTRODUODENOSCOPY (EGD);  Surgeon: Petra Kuba, MD;  Location: [facility];  Service: Endoscopy;  Laterality: N/A;   history of lumbar compression fracture     left eardum sx  1983   left knee arthroscopic surgery     no screening colonoscopy     s/p bilateral lumpectomies     SAVORY DILATION  07/26/2011   Procedure: SAVORY DILATION;  Surgeon: Petra Kuba, MD;  Location: [facility];  Service: Endoscopy;  Laterality: N/A;   status post resection squamous cell cancer right lower leg     TONSILLECTOMY     UMBILICAL HERNIA REPAIR      FAMILY HISTORY Family History  Problem Relation Age of Onset   Cancer Mother 57       breast   Heart attack Father    Heart attack Brother    Heart attack Brother    Cancer Maternal Aunt 77       breast   Coronary artery disease Other    Cancer Other        ovarian; daughter of mat aunt w/ BC in 9s   Heart disease Brother     Cancer Maternal Aunt 56       breast  the patient's father died at age 50 from a heart attack. The patient's mother died at age 23. The patient's mother was one of 8 sisters, 9 siblings. 2 of the sisters had breast cancer, postmenopausal. [place] self had 2 half-brothers and 1 full brother as well as one half-sister.There is no history of ovarian cancer in the familyexcept as noted.   GYNECOLOGIC HISTORY:  No LMP recorded. Patient is postmenopausal. Menarche age 52, the patient is GX P0. 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 Her husband [name] is a former Psychologist, educational. They currently reside [facility] [place], with no pets.    ADVANCED DIRECTIVES: in place   HEALTH MAINTENANCE: Social History   Tobacco Use   Smoking status: Never   Smokeless tobacco: Never  Vaping Use   Vaping status: Never Used  Substance Use Topics   Alcohol use: No   Drug use: No  Colonoscopy:  PAP:  Bone density:  Lipid panel:  Allergies  Allergen Reactions   Celecoxib Swelling    Swelling-leg    Current Outpatient Medications  Medication Sig Dispense Refill   apixaban (ELIQUIS) 5 MG TABS tablet TAKE 1 TABLET BY MOUTH TWICE A DAY 60 tablet 9   atorvastatin (LIPITOR) 40 MG tablet TAKE 1 TABLET BY MOUTH EVERY DAY 90 tablet 1   famotidine (PEPCID) 20 MG tablet Take 20 mg by mouth 2 (two) times daily.     furosemide (LASIX) 40 MG tablet TAKE 1.5 TABLETS BY MOUTH DAILY. 135 tablet 3   ipratropium (ATROVENT) 0.06 % nasal spray 2 sprays each nostril every 6 (SIX) hours to dry up nose. 15 mL 10   Magnesium Oxide 250 MG TABS Take 1 tablet (250 mg total) by mouth daily.     metoprolol succinate (TOPROL-XL) 25 MG 24 hr tablet TAKE 1 TABLET BY MOUTH EVERYDAY AT BEDTIME 90 tablet 2   montelukast (SINGULAIR) 10 MG tablet TAKE 1 TABLET BY  MOUTH AT NIGHT FOR COUGH OR WHEEZING 90 tablet 1   Multiple Vitamins-Minerals (PRESERVISION AREDS 2) CAPS Take 1 capsule by mouth 2 (two) times daily.      potassium chloride SA (KLOR-CON M) 20 MEQ tablet Take 1 tablet (20 mEq total) by mouth 2 (two) times daily. 90 tablet 3   tamoxifen (NOLVADEX) 20 MG tablet Take 1 tablet (20 mg total) by mouth daily. 90 tablet 3   No current facility-administered medications for this visit.    OBJECTIVE: white woman using a walker  Vitals:   07/05/23 1226  BP: 139/64  Pulse: 76  Resp: 16  Temp: 97.9 F (36.6 C)  SpO2: 96%      Body mass index is 29.15 kg/m.    ECOG FS:2 - Symptomatic, <50% confined to bed  Sclerae unicteric, EOMs intact Breasts: Bilateral breasts inspected and palpated.  No new masses noted.  No regional adenopathy Chest: CTA bilaterally No LE edema.  LAB RESULTS:  CMP     Component Value Date/Time   NA 141 06/23/2023 0831   NA 142 05/12/2022 1532   NA 145 09/19/2017 0000   NA 145 09/18/2014 1245   K 4.5 06/23/2023 0831   K 3.7 09/19/2017 0000   K 3.9 09/18/2014 1245   CL 108 06/23/2023 0831   CO2 27 06/23/2023 0831   CO2 25 09/18/2014 1245   GLUCOSE 112 (H) 06/23/2023 0831   GLUCOSE 107 09/18/2014 1245   BUN 41 (H) 06/23/2023 0831   BUN 35 05/12/2022 1532   BUN 20.1 09/18/2014 1245   CREATININE 1.14 (H) 06/23/2023 0831   CREATININE 1.2 (H) 09/18/2014 1245   CALCIUM 8.8 06/23/2023 0831   CALCIUM 7.3 09/19/2017 0000   CALCIUM 9.1 09/18/2014 1245   PROT 5.6 (L) 06/23/2023 0831   PROT 5.1 09/19/2017 0000   PROT 6.8 09/18/2014 1245   ALBUMIN 2.9 (L) 03/10/2023 1557   ALBUMIN 2.8 09/19/2017 0000   ALBUMIN 3.8 09/18/2014 1245   AST 16 06/23/2023 0831   AST 14 (L) 09/15/2021 1255   AST 14 09/18/2014 1245   ALT 12 06/23/2023 0831   ALT 12 09/15/2021 1255   ALT 23 09/19/2017 0000   ALT 8 09/18/2014 1245   ALKPHOS 71 03/10/2023 1557   ALKPHOS 87 09/19/2017 0000   ALKPHOS 100 09/18/2014 1245   BILITOT 0.4  06/23/2023 0831   BILITOT 0.4 09/15/2021 1255   BILITOT 0.49 09/18/2014 1245   GFRNONAA 45 (L)  03/10/2023 1557   GFRNONAA 43 (L) 09/15/2021 1255   GFRNONAA 38 (L) 01/21/2021 0907   GFRAA 44 (L) 01/21/2021 0907    INo results found for: "SPEP", "UPEP"  Lab Results  Component Value Date   WBC 4.4 06/23/2023   NEUTROABS 2,957 06/23/2023   HGB 10.9 (L) 06/23/2023   HCT 35.3 06/23/2023   MCV 85.5 06/23/2023   PLT 187 06/23/2023      Chemistry      Component Value Date/Time   NA 141 06/23/2023 0831   NA 142 05/12/2022 1532   NA 145 09/19/2017 0000   NA 145 09/18/2014 1245   K 4.5 06/23/2023 0831   K 3.7 09/19/2017 0000   K 3.9 09/18/2014 1245   CL 108 06/23/2023 0831   CO2 27 06/23/2023 0831   CO2 25 09/18/2014 1245   BUN 41 (H) 06/23/2023 0831   BUN 35 05/12/2022 1532   BUN 20.1 09/18/2014 1245   CREATININE 1.14 (H) 06/23/2023 0831   CREATININE 1.2 (H) 09/18/2014 1245   GLU 123 07/09/2019 0000      Component Value Date/Time   CALCIUM 8.8 06/23/2023 0831   CALCIUM 7.3 09/19/2017 0000   CALCIUM 9.1 09/18/2014 1245   ALKPHOS 71 03/10/2023 1557   ALKPHOS 87 09/19/2017 0000   ALKPHOS 100 09/18/2014 1245   AST 16 06/23/2023 0831   AST 14 (L) 09/15/2021 1255   AST 14 09/18/2014 1245   ALT 12 06/23/2023 0831   ALT 12 09/15/2021 1255   ALT 23 09/19/2017 0000   ALT 8 09/18/2014 1245   BILITOT 0.4 06/23/2023 0831   BILITOT 0.4 09/15/2021 1255   BILITOT 0.49 09/18/2014 1245       Lab Results  Component Value Date   LABCA2 34 05/20/2011    No components found for: "LABCA125"  No results for input(s): "INR" in the last 168 hours.  Urinalysis    Component Value Date/Time   COLORURINE YELLOW 08/12/2017 0627   APPEARANCEUR Clear 05/12/2022 1532   LABSPEC 1.014 08/12/2017 0627   PHURINE 6.0 08/12/2017 0627   GLUCOSEU Negative 05/12/2022 1532   HGBUR SMALL (A) 08/12/2017 0627   HGBUR trace-intact 10/28/2009 0905   BILIRUBINUR Negative 05/12/2022 1532    KETONESUR NEGATIVE 08/12/2017 0627   PROTEINUR Negative 05/12/2022 1532   PROTEINUR NEGATIVE 08/12/2017 0627   UROBILINOGEN 0.2 10/28/2009 0905   NITRITE Negative 05/12/2022 1532   NITRITE NEGATIVE 08/12/2017 0627   LEUKOCYTESUR 1+ (A) 05/12/2022 1532    STUDIES: No results found.    ASSESSMENT: 89 y.o. BRCA negative [facility] [place] resident   (1) status post bilateral lumpectomies without sentinel lymph node biopsy in August 2007 for a right-sided 7 mm, grade 2, invasive ductal carcinoma, and a left-sided 8 mm, grade 1, invasive ductal carcinoma.  Both tumors were ER positive, HER-2/neu negative, both with a low proliferation fraction.   (a) On Arimidex from September 2007 until May 2009, at which time we switched to tamoxifen primarily secondary to concerns regarding osteoporosis, completing five years of antiestrogen September 2011.   (b)  Also received Zometa yearly, discontinued after 4 doses in 2011  (2)  Status post right breast upper outer quadrant biopsy 09/11/2014 4 a 4.2 cm area of ductal carcinoma in situ, grade 2 or 3,  with extravasated mucin, estrogen and progesterone receptor positive   (3) status post right lumpectomy 10/01/2014 for a pT1a pNX, stage IA invasive ductal carcinoma, grade 2, estrogen  and progesterone receptor positive, HER-2 negative,  with an MIB-1 of 38%. Margins were close but negative   (4) she did not need adjuvant radiation   (5) opted against anti-estrogens  (a)  DEXA scan 10/31/2014 shows a T score of -1.8.  (6)  the BreastNext gene panel performed at [facility] 09/19/2014 showed no deleterious mutations in ATM, BARD1, BRCA1, BRCA2, BRIP1, CDH1, CHEK2, MRE11A, MUTYH, NBN, NF1, PALB2, PTEN, RAD50, RAD51C, RAD51D, or TP53.  (7) status post left lumpectomy 03/28/2019 for a pT1c pNX, stage IA invasive ductal carcinoma, with negative margins; the tumor was strongly estrogen and progesterone receptor positive, HER-2 not amplified, with an MIB-1 of  15%.  (8) she is status post  right lumpectomy on 08/18/2021, final pathology showed grade 2 IDC measuring 4 mm, DCIS, low to intermediate grade, resection margins are negative for invasive carcinoma, closest anterior margin at 2 mm prognostic showed ER 95% strong intensity, PR 95% strong intensity, Ki-67 of less than 1% and HER2 negative.   PLAN  Upper Respiratory Symptoms Mild congestion and cough, possibly secondary to exposure to husband with walking pneumonia. No fever or sore throat. No signs of pneumonia on examination. -No antibiotics prescribed at this time.  Breast Cancer History of recurrent breast cancer associated with discontinuation of Tamoxifen. Currently on Tamoxifen. -Continue Tamoxifen indefinitely due to history of recurrent breast cancer when medication is discontinued.  Mammogram Last mammogram was in November 2023. Patient due for annual mammogram in November 2024, but considering biennial screening due to age. -Schedule mammogram for January 2025 at [facility] per patient's preference.  Memory Issues Patient reports some memory loss, but no significant functional impairment or personality changes noted. -No changes to current management. Continue monitoring.  Follow-up Annual visit planned for next year.  Total encounter time .*   *Total Encounter Time as defined by the Centers for Medicare and Medicaid Services includes, in addition to the face-to-face time of a patient visit (documented in the note above) non-face-to-face time: obtaining and reviewing outside history, ordering and reviewing medications, tests or procedures, care coordination (communications with other health care professionals or caregivers) and documentation in the medical record.  Rachel Moulds MD

## 2023-07-06 ENCOUNTER — Emergency Department (HOSPITAL_COMMUNITY): Payer: MEDICARE

## 2023-07-06 ENCOUNTER — Emergency Department (HOSPITAL_COMMUNITY)
Admission: EM | Admit: 2023-07-06 | Discharge: 2023-07-06 | Disposition: A | Discharge status: Home / Self Care | Payer: MEDICARE | Attending: Emergency Medicine | Admitting: Emergency Medicine

## 2023-07-06 ENCOUNTER — Other Ambulatory Visit: Payer: Self-pay

## 2023-07-06 ENCOUNTER — Encounter: Payer: MEDICARE | Admitting: Internal Medicine

## 2023-07-06 ENCOUNTER — Encounter (HOSPITAL_COMMUNITY): Payer: Self-pay

## 2023-07-06 ENCOUNTER — Telehealth: Payer: Self-pay

## 2023-07-06 ENCOUNTER — Encounter: Payer: Self-pay | Admitting: Internal Medicine

## 2023-07-06 DIAGNOSIS — I480 Paroxysmal atrial fibrillation: Secondary | ICD-10-CM | POA: Diagnosis not present

## 2023-07-06 DIAGNOSIS — I491 Atrial premature depolarization: Secondary | ICD-10-CM | POA: Diagnosis not present

## 2023-07-06 DIAGNOSIS — I4891 Unspecified atrial fibrillation: Secondary | ICD-10-CM

## 2023-07-06 DIAGNOSIS — R2681 Unsteadiness on feet: Secondary | ICD-10-CM | POA: Diagnosis not present

## 2023-07-06 DIAGNOSIS — Z79899 Other long term (current) drug therapy: Secondary | ICD-10-CM | POA: Insufficient documentation

## 2023-07-06 DIAGNOSIS — R42 Dizziness and giddiness: Secondary | ICD-10-CM | POA: Diagnosis not present

## 2023-07-06 DIAGNOSIS — Z1152 Encounter for screening for COVID-19: Secondary | ICD-10-CM | POA: Diagnosis not present

## 2023-07-06 DIAGNOSIS — R0989 Other specified symptoms and signs involving the circulatory and respiratory systems: Secondary | ICD-10-CM | POA: Diagnosis not present

## 2023-07-06 DIAGNOSIS — R002 Palpitations: Secondary | ICD-10-CM

## 2023-07-06 DIAGNOSIS — Z7901 Long term (current) use of anticoagulants: Secondary | ICD-10-CM | POA: Insufficient documentation

## 2023-07-06 DIAGNOSIS — R000 Tachycardia, unspecified: Secondary | ICD-10-CM | POA: Diagnosis not present

## 2023-07-06 DIAGNOSIS — R41841 Cognitive communication deficit: Secondary | ICD-10-CM | POA: Diagnosis not present

## 2023-07-06 DIAGNOSIS — Z9181 History of falling: Secondary | ICD-10-CM | POA: Diagnosis not present

## 2023-07-06 DIAGNOSIS — R278 Other lack of coordination: Secondary | ICD-10-CM | POA: Diagnosis not present

## 2023-07-06 DIAGNOSIS — I2510 Atherosclerotic heart disease of native coronary artery without angina pectoris: Secondary | ICD-10-CM | POA: Diagnosis not present

## 2023-07-06 DIAGNOSIS — I959 Hypotension, unspecified: Secondary | ICD-10-CM | POA: Diagnosis not present

## 2023-07-06 DIAGNOSIS — M6281 Muscle weakness (generalized): Secondary | ICD-10-CM | POA: Diagnosis not present

## 2023-07-06 LAB — BASIC METABOLIC PANEL
Anion gap: 10 (ref 5–15)
BUN: 39 mg/dL — ABNORMAL HIGH (ref 8–23)
CO2: 23 mmol/L (ref 22–32)
Calcium: 8.2 mg/dL — ABNORMAL LOW (ref 8.9–10.3)
Chloride: 108 mmol/L (ref 98–111)
Creatinine, Ser: 1.3 mg/dL — ABNORMAL HIGH (ref 0.44–1.00)
GFR, Estimated: 37 mL/min — ABNORMAL LOW (ref >60)
Glucose, Bld: 139 mg/dL — ABNORMAL HIGH (ref 70–99)
Potassium: 3.7 mmol/L (ref 3.5–5.1)
Sodium: 141 mmol/L (ref 135–145)

## 2023-07-06 LAB — CBC
HCT: 33.9 % — ABNORMAL LOW (ref 36.0–46.0)
Hemoglobin: 10.3 g/dL — ABNORMAL LOW (ref 12.0–15.0)
MCH: 26.9 pg (ref 26.0–34.0)
MCHC: 30.4 g/dL (ref 30.0–36.0)
MCV: 88.5 fL (ref 80.0–100.0)
Platelets: 171 10*3/uL (ref 150–400)
RBC: 3.83 MIL/uL — ABNORMAL LOW (ref 3.87–5.11)
RDW: 17.1 % — ABNORMAL HIGH (ref 11.5–15.5)
WBC: 5.1 10*3/uL (ref 4.0–10.5)
nRBC: 0 % (ref 0.0–0.2)

## 2023-07-06 LAB — RESP PANEL BY RT-PCR (RSV, FLU A&B, COVID)  RVPGX2
Influenza A by PCR: NEGATIVE
Influenza B by PCR: NEGATIVE
Resp Syncytial Virus by PCR: NEGATIVE
SARS Coronavirus 2 by RT PCR: NEGATIVE

## 2023-07-06 LAB — MAGNESIUM: Magnesium: 2.1 mg/dL (ref 1.7–2.4)

## 2023-07-06 NOTE — ED Triage Notes (Signed)
Patient BIB [facility] from [facility] due to palpitations. Patient states she was woken up feeling like her heart was racing. Patient denies chest pain, SOB, or N/V. According to EMS patient in NSR with occasional PAC. Patient is A&Ox4 and has no complaints at this time. Denies palps at this time.

## 2023-07-06 NOTE — Telephone Encounter (Signed)
Patient was suppose to have an appointment today but just got out of the hospital. Cough was discussed and testing was done and was negative for covid per patient. Patient would like to know is appropriate if she could start the Singulair again. They also would like the results the chest xray. They did not want to get a bill from hospital and our office in the same day. I advised them that I would call.

## 2023-07-06 NOTE — ED Provider Notes (Signed)
[facility] DEPARTMENT AT [facility] Provider Note   CSN: [id number] Arrival date & time: 07/06/23  0242     History  Chief Complaint  Patient presents with   Palpitations    [name] is a 89 y.o. female.  The history is provided by the patient and the spouse.  Patient with history of paroxysmal atrial fibrillation on Eliquis, CAD presents with palpitations.  Patient woke up and felt that her heart was racing.  She lives in an independent living facility.  Her husband called the nurse and they report her heart rate was in the 180s EMS was called and on their arrival she was improving.  She is currently in a sinus rhythm.  Patient has no complaints at this time except a recent cough History is limited as she is hard of hearing    Home Medications Prior to Admission medications   Medication Sig Start Date End Date Taking? Authorizing Provider  apixaban (ELIQUIS) 5 MG TABS tablet TAKE 1 TABLET BY MOUTH TWICE A DAY 12/14/22   Croitoru, Mihai, MD  atorvastatin (LIPITOR) 40 MG tablet TAKE 1 TABLET BY MOUTH EVERY DAY 05/16/23   Mahlon Gammon, MD  famotidine (PEPCID) 20 MG tablet Take 20 mg by mouth 2 (two) times daily.    [provider]  furosemide (LASIX) 40 MG tablet TAKE 1.5 TABLETS BY MOUTH DAILY. 06/01/23   Croitoru, Mihai, MD  ipratropium (ATROVENT) 0.06 % nasal spray 2 sprays each nostril every 6 (SIX) hours to dry up nose. 03/31/23   Kozlow, Alvira Philips, MD  Magnesium Oxide 250 MG TABS Take 1 tablet (250 mg total) by mouth daily. 03/14/23   Mahlon Gammon, MD  metoprolol succinate (TOPROL-XL) 25 MG 24 hr tablet TAKE 1 TABLET BY MOUTH EVERYDAY AT BEDTIME 02/17/23   Croitoru, Mihai, MD  montelukast (SINGULAIR) 10 MG tablet TAKE 1 TABLET BY MOUTH AT NIGHT FOR COUGH OR WHEEZING 08/16/22   Kozlow, Alvira Philips, MD  Multiple Vitamins-Minerals (PRESERVISION AREDS 2) CAPS Take 1 capsule by mouth 2 (two) times daily.     [provider]  potassium chloride  SA (KLOR-CON M) 20 MEQ tablet Take 1 tablet (20 mEq total) by mouth 2 (two) times daily. 01/11/23   Croitoru, Mihai, MD  tamoxifen (NOLVADEX) 20 MG tablet Take 1 tablet (20 mg total) by mouth daily. 07/05/23   Rachel Moulds, MD      Allergies    Celecoxib    Review of Systems   Review of Systems  Constitutional:  Negative for fever.  Cardiovascular:  Positive for palpitations.    Physical Exam Updated Vital Signs BP (!) 119/51   Pulse 70   Temp 98.7 F (37.1 C) (Oral)   Resp 17   Ht 1.575 m (5\' 2" )   Wt 72 kg   SpO2 98%   BMI 29.03 kg/m  Physical Exam CONSTITUTIONAL: Elderly but appears younger than stated age, hard of hearing HEAD: Normocephalic/atraumatic ENMT: Mucous membranes moist NECK: supple no meningeal signs CV: S1/S2 noted, no murmurs/rubs/gallops noted LUNGS: Lungs are clear to auscultation bilaterally, no apparent distress ABDOMEN: soft NEURO: Pt is awake/alert/appropriate, moves all extremitiesx4.  No facial droop.   EXTREMITIES: pulses normal/equal, full ROM SKIN: warm, color normal PSYCH: no abnormalities of mood noted, alert and oriented to situation  ED Results / Procedures / Treatments   Labs (all labs ordered are listed, but only abnormal results are displayed) Labs Reviewed  BASIC METABOLIC PANEL - Abnormal; Notable  for the following components:      Result Value   Glucose, Bld 139 (*)    BUN 39 (*)    Creatinine, Ser 1.30 (*)    Calcium 8.2 (*)    GFR, Estimated 37 (*)    All other components within normal limits  CBC - Abnormal; Notable for the following components:   RBC 3.83 (*)    Hemoglobin 10.3 (*)    HCT 33.9 (*)    RDW 17.1 (*)    All other components within normal limits  RESP PANEL BY RT-PCR (RSV, FLU A&B, COVID)  [id number]  MAGNESIUM    EKG EKG at 2:48 AM reveals sinus rhythm heart rate of 93, left axis deviation, no acute ST changes  Radiology DG Chest Portable 1 View Result Date: 07/06/2023 CLINICAL DATA:  Palpitations  EXAM: PORTABLE CHEST 1 VIEW COMPARISON:  03/10/2023 FINDINGS: The lungs are symmetrically well inflated and pulmonary insufflation has improved in the interval. Chronic interstitial changes are again seen diffusely. No confluent pulmonary infiltrate. No pneumothorax or pleural effusion. Cardiac size within normal limits. Mild central pulmonary vascular congestion present without overt pulmonary edema. No acute bone abnormality. IMPRESSION: 1. Improved pulmonary insufflation. 2. Mild central pulmonary vascular congestion without overt pulmonary edema. Electronically Signed   By: Helyn Numbers M.D.   On: 07/06/2023 03:26    Procedures Procedures    Medications Ordered in ED Medications - No data to display  ED Course/ Medical Decision Making/ A&P Clinical Course as of 07/06/23 0610  Wed Jul 06, 2023  0311 I spoke to her husband keyshia kearn about what happened.  He reports that she woke up feeling that her heart was racing, but it improved spontaneously.  He reports that they have both been having cough, and he is currently being treated for "walking pneumonia" Will keep on monitor, and also plan to check viral panel and chest x-ray Patient currently stable at this time [DW]  0410 Creatinine(!): 1.30 Mild renal insufficiency [DW]  0410 Hemoglobin(!): 10.3 Anemia [DW]  0608 Patient has been stable in the ER.  She did appear to have a brief episode of atrial fibrillation that terminated spontaneously.  She is currently in sinus rhythm. Overall workup has been unremarkable. [DW]  1610 At this point I feel patient is safe for discharge home. I discussed this with her husband via the phone, he will arrange transport home.  She can follow-up with cardiology [DW]    Clinical Course User Index [DW] Zadie Rhine, MD                                 Medical Decision Making Amount and/or Complexity of Data Reviewed Labs: ordered. Decision-making details documented in ED Course. Radiology:  ordered.   This patient presents to the ED for concern of palpitations, this involves an extensive number of treatment options, and is a complaint that carries with it a high risk of complications and morbidity.  The differential diagnosis includes but is not limited to atrial fibrillation, atrial flutter, SVT, sinus tachycardia, ventricular tachycardia, PACs, PVCs  Comorbidities that complicate the patient evaluation: Patient's presentation is complicated by their history of atrial fibrillation, CAD  Social Determinants of Health: Patient's  hearing loss   increases the complexity of managing their presentation  Additional history obtained: Additional history obtained from family Records reviewed  cardiology notes reviewed  Lab Tests: I Ordered, and personally interpreted labs.  The pertinent results include: Mild renal insufficiency  Imaging Studies ordered: I ordered imaging studies including X-ray chest   I independently visualized and interpreted imaging which showed no acute findings I agree with the radiologist interpretation  Cardiac Monitoring: The patient was maintained on a cardiac monitor.  I personally viewed and interpreted the cardiac monitor which showed an underlying rhythm of:  sinus rhythm  Reevaluation: After the interventions noted above, I reevaluated the patient and found that they have :improved  Complexity of problems addressed: Patient's presentation is most consistent with  acute presentation with potential threat to life or bodily function  Disposition: After consideration of the diagnostic results and the patient's response to treatment,  I feel that the patent would benefit from discharge   .           Final Clinical Impression(s) / ED Diagnoses Final diagnoses:  Palpitations  Atrial fibrillation, unspecified type [facility])    Rx / DC Orders ED Discharge Orders     None         Zadie Rhine, MD 07/06/23 [phone]

## 2023-07-08 DIAGNOSIS — R2681 Unsteadiness on feet: Secondary | ICD-10-CM | POA: Diagnosis not present

## 2023-07-08 DIAGNOSIS — Z9181 History of falling: Secondary | ICD-10-CM | POA: Diagnosis not present

## 2023-07-08 DIAGNOSIS — R278 Other lack of coordination: Secondary | ICD-10-CM | POA: Diagnosis not present

## 2023-07-08 DIAGNOSIS — R41841 Cognitive communication deficit: Secondary | ICD-10-CM | POA: Diagnosis not present

## 2023-07-08 DIAGNOSIS — M6281 Muscle weakness (generalized): Secondary | ICD-10-CM | POA: Diagnosis not present

## 2023-07-09 NOTE — Progress Notes (Signed)
Error

## 2023-07-11 DIAGNOSIS — Z9181 History of falling: Secondary | ICD-10-CM | POA: Diagnosis not present

## 2023-07-11 DIAGNOSIS — R278 Other lack of coordination: Secondary | ICD-10-CM | POA: Diagnosis not present

## 2023-07-11 DIAGNOSIS — R41841 Cognitive communication deficit: Secondary | ICD-10-CM | POA: Diagnosis not present

## 2023-07-11 DIAGNOSIS — M6281 Muscle weakness (generalized): Secondary | ICD-10-CM | POA: Diagnosis not present

## 2023-07-11 DIAGNOSIS — R2681 Unsteadiness on feet: Secondary | ICD-10-CM | POA: Diagnosis not present

## 2023-07-15 DIAGNOSIS — Z9181 History of falling: Secondary | ICD-10-CM | POA: Diagnosis not present

## 2023-07-15 DIAGNOSIS — R41841 Cognitive communication deficit: Secondary | ICD-10-CM | POA: Diagnosis not present

## 2023-07-15 DIAGNOSIS — R278 Other lack of coordination: Secondary | ICD-10-CM | POA: Diagnosis not present

## 2023-07-15 DIAGNOSIS — M6281 Muscle weakness (generalized): Secondary | ICD-10-CM | POA: Diagnosis not present

## 2023-07-15 DIAGNOSIS — R2681 Unsteadiness on feet: Secondary | ICD-10-CM | POA: Diagnosis not present

## 2023-07-19 DIAGNOSIS — R41841 Cognitive communication deficit: Secondary | ICD-10-CM | POA: Diagnosis not present

## 2023-07-19 DIAGNOSIS — Z9181 History of falling: Secondary | ICD-10-CM | POA: Diagnosis not present

## 2023-07-19 DIAGNOSIS — R2681 Unsteadiness on feet: Secondary | ICD-10-CM | POA: Diagnosis not present

## 2023-07-19 DIAGNOSIS — M6281 Muscle weakness (generalized): Secondary | ICD-10-CM | POA: Diagnosis not present

## 2023-07-19 DIAGNOSIS — R278 Other lack of coordination: Secondary | ICD-10-CM | POA: Diagnosis not present

## 2023-07-20 DIAGNOSIS — R41841 Cognitive communication deficit: Secondary | ICD-10-CM | POA: Diagnosis not present

## 2023-07-21 DIAGNOSIS — R41841 Cognitive communication deficit: Secondary | ICD-10-CM | POA: Diagnosis not present

## 2023-07-22 ENCOUNTER — Encounter: Payer: Self-pay | Admitting: Nurse Practitioner

## 2023-07-22 ENCOUNTER — Ambulatory Visit: Payer: MEDICARE | Attending: Nurse Practitioner | Admitting: Emergency Medicine

## 2023-07-22 VITALS — BP 112/50 | HR 78 | Ht 65.0 in | Wt 163.4 lb

## 2023-07-22 DIAGNOSIS — E785 Hyperlipidemia, unspecified: Secondary | ICD-10-CM | POA: Diagnosis not present

## 2023-07-22 DIAGNOSIS — I5032 Chronic diastolic (congestive) heart failure: Secondary | ICD-10-CM | POA: Diagnosis not present

## 2023-07-22 DIAGNOSIS — I10 Essential (primary) hypertension: Secondary | ICD-10-CM | POA: Diagnosis not present

## 2023-07-22 DIAGNOSIS — R002 Palpitations: Secondary | ICD-10-CM | POA: Diagnosis not present

## 2023-07-22 MED ORDER — METOPROLOL TARTRATE 25 MG PO TABS: 25.0000 mg | ORAL_TABLET | Freq: Three times a day (TID) | ORAL | 3 refills | Status: DC | PRN

## 2023-07-22 NOTE — Patient Instructions (Signed)
 Medication Instructions:  Start Lopressor  25 mg every 8 hours as needed for palpitations Continue all current medications *If you need a refill on your cardiac medications before your next appointment, please call your pharmacy*   Lab Work: none If you have labs (blood work) drawn today and your tests are completely normal, you will receive your results only by: MyChart Message (if you have MyChart) OR A paper copy in the mail If you have any lab test that is abnormal or we need to change your treatment, we will call you to review the results.   Testing/Procedures: none   Follow-Up: At [facility], you and your health needs are our priority.  As part of our continuing mission to provide you with exceptional heart care, we have created designated Provider Care Teams.  These Care Teams include your primary Cardiologist (physician) and Advanced Practice Providers (APPs -  Physician Assistants and Nurse Practitioners) who all work together to provide you with the care you need, when you need it.  We recommend signing up for the patient portal called MyChart.  Sign up information is provided on this After Visit Summary.  MyChart is used to connect with patients for Virtual Visits (Telemedicine).  Patients are able to view lab/test results, encounter notes, upcoming appointments, etc.  Non-urgent messages can be sent to your provider as well.   To learn more about what you can do with MyChart, go to [email or web address].    Your next appointment:   6 month(s)  Provider:   Jerel Balding, MD     Other Instructions none

## 2023-07-22 NOTE — Progress Notes (Signed)
 Cardiology Office Note:    Date:  07/22/2023  ID:  [name], DOB 09-06-1921, MRN [id number] PCP: Charlanne Fredia CROME, MD  [facility] Providers Cardiologist:  Jerel Balding, MD Cardiology APP:  Madie Jon Garre, [place]       Patient Profile:      [name] is a 89 year old female with visit pertinent history of paroxysmal atrial fibrillation, HFpEF, breast cancer, GERD, HTN, CKD stage III.  She has history of paroxysmal atrial fibrillation complicated by NSTEMI and brief CHF following cholecystectomy in January 2019.  Echocardiogram 11/2022 showed LVEF 60 to 65%, no RWMA, mild LVH, grade 2 DD, RV SF normal, left atrial size severely dilated, mild mitral valve regurgitation, severe mitral annular calcification, mild calcification of aortic valve.  Heart monitor 11/2022 showed multiple brief episodes of nonsustained ectopic atrial tachycardia.  There was no evidence of atrial fibrillation or significant bradycardia arrhythmia.  Patient had a min HR of 50, max HR 226, average HR 71 bpm.  Predominant underlying rhythm was sinus.  1 run of ventricular tachycardia occurred lasting 4 beats with a max heart rate of 126 bpm.  31 supraventricular tachycardia runs occurred, the run with the fastest interval lasting 4 beats with a max rate of 226 bpm and the longest lasting 12.8 seconds.  She was seen in the ER on 02/2023 with rapid palpitations, chest discomfort and lightheadedness.  ECG from EMS showed sinus rhythm with PVCs and her symptoms improved during transport.  In the emergency room she felt fine and telemetry did not show any atrial fibrillation.  ECG was otherwise unremarkable and her troponins were normal.  Labs are significant for low magnesium  however she does take chronic PPI as well as loop diuretics.  She was recently seen in the ER on 07/06/2023 for palpitations.  Patient noted that she woke up and felt that her heart was racing, patient noted her heart rate to be in the 180s  and called EMS.  On arrival to ED she noted that her heart racing improved spontaneously and she was in sinus rhythm on arrival.  It was thought that she had a brief episode of atrial fibrillation that terminated spontaneously.      History of Present Illness:  [name] is a 89 y.o. female who returns for ED follow-up for palpitations.  Patient arrives today with her husband and a family friend.  Over the past year she has had 2 episodes of palpitations/tachycardia.  She notes she has been doing well overall.  She notes that last week she experienced palpitations and the feeling of her heart racing that brought her to be seen in the ER.  She noted her symptoms only lasted a few minutes and by the time the paramedics had arrived on scene her palpitations and stopped.  She did not have any chest pain or shortness of breath at the time of the event.  She had been taking PPI chronically and recently discontinued due to low magnesium  and began taking Pepcid and magnesium  supplementation.  She denied any aggravating factors leading up to her palpitations, as the first time she was simply walking down the hall and the second time she was asleep.  She denies chest pain, shortness of breath, lower extremity edema, fatigue, melena, hematuria, hemoptysis, diaphoresis, weakness, presyncope, syncope, orthopnea, and PND.       Review of Systems  Constitutional: Negative for weight gain and weight loss.  Cardiovascular:  Positive for palpitations. Negative for chest pain,  claudication, cyanosis, dyspnea on exertion, irregular heartbeat, leg swelling, near-syncope, orthopnea, paroxysmal nocturnal dyspnea and syncope.  Respiratory:  Negative for cough, hemoptysis and shortness of breath.   Gastrointestinal:  Negative for abdominal pain, hematochezia and melena.  Genitourinary:  Negative for hematuria.     See HPI    Studies Reviewed:   EKG Interpretation Date/Time:  Friday July 22 2023 14:30:54  EST Ventricular Rate:  78 PR Interval:  136 QRS Duration:  108 QT Interval:  396 QTC Calculation: 451 R Axis:   -37  Text Interpretation: Normal sinus rhythm Left axis deviation Left ventricular hypertrophy with repolarization abnormality ( R in aVL , Cornell product ) Confirmed by Rana Dixon [phone]) on 07/22/2023 3:13:45 PM    ZIO 11/29/2022 Patient had a min HR of 50 bpm, max HR of 226 bpm, and avg HR of 71 bpm. Predominant underlying rhythm was Sinus Rhythm. Bundle Branch Block/IVCD was present. QRS morphology changes were present throughout recording. 1 run of Ventricular Tachycardia  occurred lasting 4 beats with a max rate of 126 bpm (avg 111 bpm). 31 Supraventricular Tachycardia runs occurred, the run with the fastest interval lasting 4 beats with a max rate of 226 bpm, the longest lasting 12.8 secs with an avg rate of 114 bpm.  Isolated SVEs were rare (<1.0%), SVE Couplets were rare (<1.0%), and SVE Triplets were rare (<1.0%). Isolated VEs were rare (<1.0%), VE Couplets were rare (<1.0%), and no VE Triplets were present. Ventricular Bigeminy and Trigeminy were present.  Difficulty discerning atrial activity making definitive diagnosis difficult to ascertain.  Echocardiogram 11/28/2022  1. Left ventricular ejection fraction, by estimation, is 60 to 65%. The  left ventricle has normal function. The left ventricle has no regional  wall motion abnormalities. There is mild concentric left ventricular  hypertrophy. Left ventricular diastolic  parameters are consistent with Grade II diastolic dysfunction  (pseudonormalization).   2. Right ventricular systolic function is normal. The right ventricular  size is normal. There is mildly elevated pulmonary artery systolic  pressure.   3. Left atrial size was severely dilated.   4. Mild mitral valve regurgitation. Severe mitral annular calcification.   5. The aortic valve is tricuspid. There is mild calcification of the  aortic valve.  Aortic valve regurgitation is not visualized.   6. The inferior vena cava is normal in size with greater than 50%  respiratory variability, suggesting right atrial pressure of 3 mmHg.  Risk Assessment/Calculations:    CHA2DS2-VASc Score = 6   This indicates a 9.7% annual risk of stroke. The patient's score is based upon: CHF History: 1 HTN History: 1 Diabetes History: 0 Stroke History: 0 Vascular Disease History: 1 Age Score: 2 Gender Score: 1            Physical Exam:   VS:  BP (!) 112/50 (BP Location: Left Arm, Patient Position: Sitting, Cuff Size: Normal)   Pulse 78   Ht 5' 5 (1.651 m)   Wt 163 lb 6.4 oz (74.1 kg)   SpO2 98%   BMI 27.19 kg/m    Wt Readings from Last 3 Encounters:  07/22/23 163 lb 6.4 oz (74.1 kg)  07/06/23 158 lb 11.7 oz (72 kg)  07/05/23 159 lb 6.4 oz (72.3 kg)    Constitutional:      Appearance: Normal and healthy appearance.  HENT:     Head: Normocephalic.  Neck:     Vascular: JVD normal.  Pulmonary:     Effort: Pulmonary effort is normal.  Breath sounds: Normal breath sounds.  Chest:     Chest wall: Not tender to palpatation.  Cardiovascular:     PMI at left midclavicular line. Normal rate. Regular rhythm. Normal S1. Normal S2.      Murmurs: There is no murmur.     No gallop.  No click. No rub.  Pulses:    Intact distal pulses.  Edema:    Peripheral edema absent.  Musculoskeletal: Normal range of motion.     Cervical back: Normal range of motion and neck supple. Skin:    General: Skin is warm and dry.  Neurological:     General: No focal deficit present.     Mental Status: Alert, oriented to person, place, and time and oriented to person, place and time.  Psychiatric:        Attention and Perception: Attention and perception normal.        Mood and Affect: Mood normal.        Behavior: Behavior is cooperative.        Thought Content: Thought content normal.        Assessment and Plan:  Palpitations / Atrial  fibrillation -ZIO 11/2022 showed 31 supraventricular tachycardia runs that occurred, the run with the fastest interval lasting 4 beats with a max rate of 226 bpm, the longest lasting 12.8 secs with an avg rate of 114 bpm, no evidence of A-fib -I suspect this her palpitations that brought her to the ER recently was likely episodes of nonsustained ectopic atrial tachycardia versus atrial fibrillation.  She has noted only 2 symptomatic episodes over the past year so it seems that this is not common for her.  Hypomagnesemia has since been corrected after discontinuation of PPI. -I will plan to add metoprolol  tartrate 25 mg every 8 hours as needed for palpitations.  Caution in increasing her metoprolol  XL at this time due to complaints of fatigue and soft BP.   -Continue metoprolol  XL 25 mg once daily and Eliquis  5 mg twice daily.  No bleeding concerns on chronic anticoagulation, she does not meet dose reduction qualifications. -Her EKG today shows NSR HR 78 bpm with no ST-T wave elevations.  Chronic diastolic HF -Echo 11/2022 with LVEF 60-65%, no RWMA, mild LVH, grade 2 diastolic dysfunction.  She is euvolemic and well compensated at this time with no current lower extremity edema.  Currently maintained on furosemide  daily.  Encouraged elevating legs and compression stockings daily. -Continue Lasix  60 mg daily, metoprolol  XL 25 mg daily  Hypertension -BP today 112/50 and well controlled. Continue Metoprolol  XL 25mg  daily   Hyperlipidemia -LDL 53 on 06/2023. Well controlled with LDL goal <70. Continue Atorvastatin  40mg  daily.                Dispo:  Return in about 6 months (around 01/19/2024).  Signed, Lum LITTIE Louis, NP

## 2023-07-25 DIAGNOSIS — R41841 Cognitive communication deficit: Secondary | ICD-10-CM | POA: Diagnosis not present

## 2023-07-27 DIAGNOSIS — R41841 Cognitive communication deficit: Secondary | ICD-10-CM | POA: Diagnosis not present

## 2023-08-01 DIAGNOSIS — R41841 Cognitive communication deficit: Secondary | ICD-10-CM | POA: Diagnosis not present

## 2023-08-03 DIAGNOSIS — R41841 Cognitive communication deficit: Secondary | ICD-10-CM | POA: Diagnosis not present

## 2023-08-05 DIAGNOSIS — R41841 Cognitive communication deficit: Secondary | ICD-10-CM | POA: Diagnosis not present

## 2023-08-08 DIAGNOSIS — R41841 Cognitive communication deficit: Secondary | ICD-10-CM | POA: Diagnosis not present

## 2023-08-10 DIAGNOSIS — R41841 Cognitive communication deficit: Secondary | ICD-10-CM | POA: Diagnosis not present

## 2023-08-11 DIAGNOSIS — Z853 Personal history of malignant neoplasm of breast: Secondary | ICD-10-CM | POA: Diagnosis not present

## 2023-08-15 DIAGNOSIS — R41841 Cognitive communication deficit: Secondary | ICD-10-CM | POA: Diagnosis not present

## 2023-08-17 DIAGNOSIS — R41841 Cognitive communication deficit: Secondary | ICD-10-CM | POA: Diagnosis not present

## 2023-08-19 DIAGNOSIS — R41841 Cognitive communication deficit: Secondary | ICD-10-CM | POA: Diagnosis not present

## 2023-08-22 DIAGNOSIS — Z9181 History of falling: Secondary | ICD-10-CM | POA: Diagnosis not present

## 2023-08-22 DIAGNOSIS — R41841 Cognitive communication deficit: Secondary | ICD-10-CM | POA: Diagnosis not present

## 2023-08-22 DIAGNOSIS — R2681 Unsteadiness on feet: Secondary | ICD-10-CM | POA: Diagnosis not present

## 2023-08-22 DIAGNOSIS — M6281 Muscle weakness (generalized): Secondary | ICD-10-CM | POA: Diagnosis not present

## 2023-08-22 DIAGNOSIS — R278 Other lack of coordination: Secondary | ICD-10-CM | POA: Diagnosis not present

## 2023-08-24 DIAGNOSIS — R278 Other lack of coordination: Secondary | ICD-10-CM | POA: Diagnosis not present

## 2023-08-24 DIAGNOSIS — R2681 Unsteadiness on feet: Secondary | ICD-10-CM | POA: Diagnosis not present

## 2023-08-24 DIAGNOSIS — R41841 Cognitive communication deficit: Secondary | ICD-10-CM | POA: Diagnosis not present

## 2023-08-24 DIAGNOSIS — M6281 Muscle weakness (generalized): Secondary | ICD-10-CM | POA: Diagnosis not present

## 2023-08-24 DIAGNOSIS — Z9181 History of falling: Secondary | ICD-10-CM | POA: Diagnosis not present

## 2023-08-25 DIAGNOSIS — R2681 Unsteadiness on feet: Secondary | ICD-10-CM | POA: Diagnosis not present

## 2023-08-25 DIAGNOSIS — R278 Other lack of coordination: Secondary | ICD-10-CM | POA: Diagnosis not present

## 2023-08-25 DIAGNOSIS — R41841 Cognitive communication deficit: Secondary | ICD-10-CM | POA: Diagnosis not present

## 2023-08-25 DIAGNOSIS — Z9181 History of falling: Secondary | ICD-10-CM | POA: Diagnosis not present

## 2023-08-25 DIAGNOSIS — M6281 Muscle weakness (generalized): Secondary | ICD-10-CM | POA: Diagnosis not present

## 2023-08-26 DIAGNOSIS — M6281 Muscle weakness (generalized): Secondary | ICD-10-CM | POA: Diagnosis not present

## 2023-08-26 DIAGNOSIS — R2681 Unsteadiness on feet: Secondary | ICD-10-CM | POA: Diagnosis not present

## 2023-08-26 DIAGNOSIS — R278 Other lack of coordination: Secondary | ICD-10-CM | POA: Diagnosis not present

## 2023-08-26 DIAGNOSIS — Z9181 History of falling: Secondary | ICD-10-CM | POA: Diagnosis not present

## 2023-08-26 DIAGNOSIS — R41841 Cognitive communication deficit: Secondary | ICD-10-CM | POA: Diagnosis not present

## 2023-08-29 DIAGNOSIS — R2681 Unsteadiness on feet: Secondary | ICD-10-CM | POA: Diagnosis not present

## 2023-08-29 DIAGNOSIS — Z9181 History of falling: Secondary | ICD-10-CM | POA: Diagnosis not present

## 2023-08-29 DIAGNOSIS — M6281 Muscle weakness (generalized): Secondary | ICD-10-CM | POA: Diagnosis not present

## 2023-08-29 DIAGNOSIS — R278 Other lack of coordination: Secondary | ICD-10-CM | POA: Diagnosis not present

## 2023-08-29 DIAGNOSIS — R41841 Cognitive communication deficit: Secondary | ICD-10-CM | POA: Diagnosis not present

## 2023-08-31 DIAGNOSIS — Z9181 History of falling: Secondary | ICD-10-CM | POA: Diagnosis not present

## 2023-08-31 DIAGNOSIS — M6281 Muscle weakness (generalized): Secondary | ICD-10-CM | POA: Diagnosis not present

## 2023-08-31 DIAGNOSIS — R278 Other lack of coordination: Secondary | ICD-10-CM | POA: Diagnosis not present

## 2023-08-31 DIAGNOSIS — R2681 Unsteadiness on feet: Secondary | ICD-10-CM | POA: Diagnosis not present

## 2023-08-31 DIAGNOSIS — R41841 Cognitive communication deficit: Secondary | ICD-10-CM | POA: Diagnosis not present

## 2023-09-02 DIAGNOSIS — R41841 Cognitive communication deficit: Secondary | ICD-10-CM | POA: Diagnosis not present

## 2023-09-02 DIAGNOSIS — M6281 Muscle weakness (generalized): Secondary | ICD-10-CM | POA: Diagnosis not present

## 2023-09-02 DIAGNOSIS — R278 Other lack of coordination: Secondary | ICD-10-CM | POA: Diagnosis not present

## 2023-09-02 DIAGNOSIS — Z9181 History of falling: Secondary | ICD-10-CM | POA: Diagnosis not present

## 2023-09-02 DIAGNOSIS — R2681 Unsteadiness on feet: Secondary | ICD-10-CM | POA: Diagnosis not present

## 2023-09-05 DIAGNOSIS — R278 Other lack of coordination: Secondary | ICD-10-CM | POA: Diagnosis not present

## 2023-09-05 DIAGNOSIS — R41841 Cognitive communication deficit: Secondary | ICD-10-CM | POA: Diagnosis not present

## 2023-09-05 DIAGNOSIS — R2681 Unsteadiness on feet: Secondary | ICD-10-CM | POA: Diagnosis not present

## 2023-09-05 DIAGNOSIS — M6281 Muscle weakness (generalized): Secondary | ICD-10-CM | POA: Diagnosis not present

## 2023-09-05 DIAGNOSIS — Z9181 History of falling: Secondary | ICD-10-CM | POA: Diagnosis not present

## 2023-09-06 DIAGNOSIS — M6281 Muscle weakness (generalized): Secondary | ICD-10-CM | POA: Diagnosis not present

## 2023-09-06 DIAGNOSIS — R41841 Cognitive communication deficit: Secondary | ICD-10-CM | POA: Diagnosis not present

## 2023-09-06 DIAGNOSIS — R278 Other lack of coordination: Secondary | ICD-10-CM | POA: Diagnosis not present

## 2023-09-06 DIAGNOSIS — Z9181 History of falling: Secondary | ICD-10-CM | POA: Diagnosis not present

## 2023-09-06 DIAGNOSIS — R2681 Unsteadiness on feet: Secondary | ICD-10-CM | POA: Diagnosis not present

## 2023-09-07 DIAGNOSIS — Z9181 History of falling: Secondary | ICD-10-CM | POA: Diagnosis not present

## 2023-09-07 DIAGNOSIS — M6281 Muscle weakness (generalized): Secondary | ICD-10-CM | POA: Diagnosis not present

## 2023-09-07 DIAGNOSIS — R278 Other lack of coordination: Secondary | ICD-10-CM | POA: Diagnosis not present

## 2023-09-07 DIAGNOSIS — R2681 Unsteadiness on feet: Secondary | ICD-10-CM | POA: Diagnosis not present

## 2023-09-07 DIAGNOSIS — R41841 Cognitive communication deficit: Secondary | ICD-10-CM | POA: Diagnosis not present

## 2023-09-09 DIAGNOSIS — Z9181 History of falling: Secondary | ICD-10-CM | POA: Diagnosis not present

## 2023-09-09 DIAGNOSIS — R278 Other lack of coordination: Secondary | ICD-10-CM | POA: Diagnosis not present

## 2023-09-09 DIAGNOSIS — R41841 Cognitive communication deficit: Secondary | ICD-10-CM | POA: Diagnosis not present

## 2023-09-09 DIAGNOSIS — M6281 Muscle weakness (generalized): Secondary | ICD-10-CM | POA: Diagnosis not present

## 2023-09-09 DIAGNOSIS — R2681 Unsteadiness on feet: Secondary | ICD-10-CM | POA: Diagnosis not present

## 2023-09-12 DIAGNOSIS — R278 Other lack of coordination: Secondary | ICD-10-CM | POA: Diagnosis not present

## 2023-09-12 DIAGNOSIS — R2681 Unsteadiness on feet: Secondary | ICD-10-CM | POA: Diagnosis not present

## 2023-09-12 DIAGNOSIS — M6281 Muscle weakness (generalized): Secondary | ICD-10-CM | POA: Diagnosis not present

## 2023-09-12 DIAGNOSIS — Z9181 History of falling: Secondary | ICD-10-CM | POA: Diagnosis not present

## 2023-09-12 DIAGNOSIS — R41841 Cognitive communication deficit: Secondary | ICD-10-CM | POA: Diagnosis not present

## 2023-09-13 DIAGNOSIS — R278 Other lack of coordination: Secondary | ICD-10-CM | POA: Diagnosis not present

## 2023-09-13 DIAGNOSIS — Z9181 History of falling: Secondary | ICD-10-CM | POA: Diagnosis not present

## 2023-09-13 DIAGNOSIS — R41841 Cognitive communication deficit: Secondary | ICD-10-CM | POA: Diagnosis not present

## 2023-09-13 DIAGNOSIS — M6281 Muscle weakness (generalized): Secondary | ICD-10-CM | POA: Diagnosis not present

## 2023-09-13 DIAGNOSIS — R2681 Unsteadiness on feet: Secondary | ICD-10-CM | POA: Diagnosis not present

## 2023-09-16 ENCOUNTER — Encounter: Payer: Self-pay | Admitting: Hematology and Oncology

## 2023-09-16 DIAGNOSIS — R278 Other lack of coordination: Secondary | ICD-10-CM | POA: Diagnosis not present

## 2023-09-16 DIAGNOSIS — R2681 Unsteadiness on feet: Secondary | ICD-10-CM | POA: Diagnosis not present

## 2023-09-16 DIAGNOSIS — M6281 Muscle weakness (generalized): Secondary | ICD-10-CM | POA: Diagnosis not present

## 2023-09-16 DIAGNOSIS — R41841 Cognitive communication deficit: Secondary | ICD-10-CM | POA: Diagnosis not present

## 2023-09-16 DIAGNOSIS — Z9181 History of falling: Secondary | ICD-10-CM | POA: Diagnosis not present

## 2023-09-20 DIAGNOSIS — R2681 Unsteadiness on feet: Secondary | ICD-10-CM | POA: Diagnosis not present

## 2023-09-20 DIAGNOSIS — M6281 Muscle weakness (generalized): Secondary | ICD-10-CM | POA: Diagnosis not present

## 2023-09-20 DIAGNOSIS — Z9181 History of falling: Secondary | ICD-10-CM | POA: Diagnosis not present

## 2023-09-20 DIAGNOSIS — R278 Other lack of coordination: Secondary | ICD-10-CM | POA: Diagnosis not present

## 2023-09-21 DIAGNOSIS — R2681 Unsteadiness on feet: Secondary | ICD-10-CM | POA: Diagnosis not present

## 2023-09-21 DIAGNOSIS — R278 Other lack of coordination: Secondary | ICD-10-CM | POA: Diagnosis not present

## 2023-09-21 DIAGNOSIS — M6281 Muscle weakness (generalized): Secondary | ICD-10-CM | POA: Diagnosis not present

## 2023-09-21 DIAGNOSIS — Z9181 History of falling: Secondary | ICD-10-CM | POA: Diagnosis not present

## 2023-09-27 DIAGNOSIS — M6281 Muscle weakness (generalized): Secondary | ICD-10-CM | POA: Diagnosis not present

## 2023-09-27 DIAGNOSIS — R2681 Unsteadiness on feet: Secondary | ICD-10-CM | POA: Diagnosis not present

## 2023-09-27 DIAGNOSIS — Z9181 History of falling: Secondary | ICD-10-CM | POA: Diagnosis not present

## 2023-09-27 DIAGNOSIS — R278 Other lack of coordination: Secondary | ICD-10-CM | POA: Diagnosis not present

## 2023-09-28 DIAGNOSIS — M6281 Muscle weakness (generalized): Secondary | ICD-10-CM | POA: Diagnosis not present

## 2023-09-28 DIAGNOSIS — R2681 Unsteadiness on feet: Secondary | ICD-10-CM | POA: Diagnosis not present

## 2023-09-28 DIAGNOSIS — R278 Other lack of coordination: Secondary | ICD-10-CM | POA: Diagnosis not present

## 2023-09-28 DIAGNOSIS — Z9181 History of falling: Secondary | ICD-10-CM | POA: Diagnosis not present

## 2023-09-29 ENCOUNTER — Ambulatory Visit: Payer: MEDICARE | Admitting: Podiatry

## 2023-10-03 DIAGNOSIS — R2681 Unsteadiness on feet: Secondary | ICD-10-CM | POA: Diagnosis not present

## 2023-10-03 DIAGNOSIS — M6281 Muscle weakness (generalized): Secondary | ICD-10-CM | POA: Diagnosis not present

## 2023-10-03 DIAGNOSIS — Z9181 History of falling: Secondary | ICD-10-CM | POA: Diagnosis not present

## 2023-10-03 DIAGNOSIS — R278 Other lack of coordination: Secondary | ICD-10-CM | POA: Diagnosis not present

## 2023-10-05 DIAGNOSIS — M6281 Muscle weakness (generalized): Secondary | ICD-10-CM | POA: Diagnosis not present

## 2023-10-05 DIAGNOSIS — Z9181 History of falling: Secondary | ICD-10-CM | POA: Diagnosis not present

## 2023-10-05 DIAGNOSIS — R278 Other lack of coordination: Secondary | ICD-10-CM | POA: Diagnosis not present

## 2023-10-05 DIAGNOSIS — R2681 Unsteadiness on feet: Secondary | ICD-10-CM | POA: Diagnosis not present

## 2023-10-11 DIAGNOSIS — M6281 Muscle weakness (generalized): Secondary | ICD-10-CM | POA: Diagnosis not present

## 2023-10-11 DIAGNOSIS — R2681 Unsteadiness on feet: Secondary | ICD-10-CM | POA: Diagnosis not present

## 2023-10-11 DIAGNOSIS — R278 Other lack of coordination: Secondary | ICD-10-CM | POA: Diagnosis not present

## 2023-10-11 DIAGNOSIS — Z9181 History of falling: Secondary | ICD-10-CM | POA: Diagnosis not present

## 2023-10-13 DIAGNOSIS — Z9181 History of falling: Secondary | ICD-10-CM | POA: Diagnosis not present

## 2023-10-13 DIAGNOSIS — R2681 Unsteadiness on feet: Secondary | ICD-10-CM | POA: Diagnosis not present

## 2023-10-13 DIAGNOSIS — M6281 Muscle weakness (generalized): Secondary | ICD-10-CM | POA: Diagnosis not present

## 2023-10-13 DIAGNOSIS — R278 Other lack of coordination: Secondary | ICD-10-CM | POA: Diagnosis not present

## 2023-10-17 DIAGNOSIS — Z9181 History of falling: Secondary | ICD-10-CM | POA: Diagnosis not present

## 2023-10-17 DIAGNOSIS — R2681 Unsteadiness on feet: Secondary | ICD-10-CM | POA: Diagnosis not present

## 2023-10-17 DIAGNOSIS — R278 Other lack of coordination: Secondary | ICD-10-CM | POA: Diagnosis not present

## 2023-10-17 DIAGNOSIS — M6281 Muscle weakness (generalized): Secondary | ICD-10-CM | POA: Diagnosis not present

## 2023-10-18 DIAGNOSIS — H35373 Puckering of macula, bilateral: Secondary | ICD-10-CM | POA: Diagnosis not present

## 2023-10-18 DIAGNOSIS — H353123 Nonexudative age-related macular degeneration, left eye, advanced atrophic without subfoveal involvement: Secondary | ICD-10-CM | POA: Diagnosis not present

## 2023-10-18 DIAGNOSIS — H353211 Exudative age-related macular degeneration, right eye, with active choroidal neovascularization: Secondary | ICD-10-CM | POA: Diagnosis not present

## 2023-10-18 DIAGNOSIS — H43813 Vitreous degeneration, bilateral: Secondary | ICD-10-CM | POA: Diagnosis not present

## 2023-10-19 DIAGNOSIS — M6281 Muscle weakness (generalized): Secondary | ICD-10-CM | POA: Diagnosis not present

## 2023-10-19 DIAGNOSIS — Z9181 History of falling: Secondary | ICD-10-CM | POA: Diagnosis not present

## 2023-10-19 DIAGNOSIS — R2681 Unsteadiness on feet: Secondary | ICD-10-CM | POA: Diagnosis not present

## 2023-10-19 DIAGNOSIS — R278 Other lack of coordination: Secondary | ICD-10-CM | POA: Diagnosis not present

## 2023-10-20 ENCOUNTER — Ambulatory Visit: Payer: MEDICARE | Admitting: Podiatry

## 2023-10-20 ENCOUNTER — Encounter: Payer: Self-pay | Admitting: Podiatry

## 2023-10-20 DIAGNOSIS — B351 Tinea unguium: Secondary | ICD-10-CM | POA: Diagnosis not present

## 2023-10-20 DIAGNOSIS — D689 Coagulation defect, unspecified: Secondary | ICD-10-CM

## 2023-10-20 DIAGNOSIS — D2372 Other benign neoplasm of skin of left lower limb, including hip: Secondary | ICD-10-CM

## 2023-10-20 DIAGNOSIS — M79676 Pain in unspecified toe(s): Secondary | ICD-10-CM

## 2023-10-20 DIAGNOSIS — D2371 Other benign neoplasm of skin of right lower limb, including hip: Secondary | ICD-10-CM

## 2023-10-23 NOTE — Progress Notes (Signed)
 She presents today chief complaint of painful elongated toenails and calluses bilater al  Objective: Vital signs are stable alert oriented x 3.  Toenails are long thick yellow dystrophic with mycotic multiple benign skin lesions forefoot bilateral.  No open lesions or wounds are noted.  Assessment: Pain in limb secondary to onychomycosis 1 through 5 bilateral.  Painful benign soft tissue lesions bilateral.  Plan: Debridement of toenails 1 through 5 bilateral debridement of benign skin lesions.  Follow-up with her in 3 months

## 2023-10-25 DIAGNOSIS — R278 Other lack of coordination: Secondary | ICD-10-CM | POA: Diagnosis not present

## 2023-10-25 DIAGNOSIS — M6281 Muscle weakness (generalized): Secondary | ICD-10-CM | POA: Diagnosis not present

## 2023-10-25 DIAGNOSIS — R2681 Unsteadiness on feet: Secondary | ICD-10-CM | POA: Diagnosis not present

## 2023-10-25 DIAGNOSIS — Z9181 History of falling: Secondary | ICD-10-CM | POA: Diagnosis not present

## 2023-10-27 ENCOUNTER — Other Ambulatory Visit: Payer: MEDICARE

## 2023-10-27 DIAGNOSIS — I480 Paroxysmal atrial fibrillation: Secondary | ICD-10-CM | POA: Diagnosis not present

## 2023-10-27 DIAGNOSIS — R600 Localized edema: Secondary | ICD-10-CM | POA: Diagnosis not present

## 2023-10-27 DIAGNOSIS — I5032 Chronic diastolic (congestive) heart failure: Secondary | ICD-10-CM | POA: Diagnosis not present

## 2023-10-28 LAB — CBC WITH DIFFERENTIAL/PLATELET
Absolute Lymphocytes: 993 {cells}/uL (ref 850–3900)
Absolute Monocytes: 456 {cells}/uL (ref 200–950)
Basophils Absolute: 30 {cells}/uL (ref 0–200)
Basophils Relative: 0.7 %
Eosinophils Absolute: 82 {cells}/uL (ref 15–500)
Eosinophils Relative: 1.9 %
HCT: 33.7 % — ABNORMAL LOW (ref 35.0–45.0)
Hemoglobin: 10.7 g/dL — ABNORMAL LOW (ref 11.7–15.5)
MCH: 27.2 pg (ref 27.0–33.0)
MCHC: 31.8 g/dL — ABNORMAL LOW (ref 32.0–36.0)
MCV: 85.8 fL (ref 80.0–100.0)
MPV: 11.3 fL (ref 7.5–12.5)
Monocytes Relative: 10.6 %
Neutro Abs: 2739 {cells}/uL (ref 1500–7800)
Neutrophils Relative %: 63.7 %
Platelets: 188 10*3/uL (ref 140–400)
RBC: 3.93 10*6/uL (ref 3.80–5.10)
RDW: 14.3 % (ref 11.0–15.0)
Total Lymphocyte: 23.1 %
WBC: 4.3 10*3/uL (ref 3.8–10.8)

## 2023-10-28 LAB — COMPREHENSIVE METABOLIC PANEL WITH GFR
AG Ratio: 1.4 (calc) (ref 1.0–2.5)
ALT: 11 U/L (ref 6–29)
AST: 14 U/L (ref 10–35)
Albumin: 3.4 g/dL — ABNORMAL LOW (ref 3.6–5.1)
Alkaline phosphatase (APISO): 62 U/L (ref 37–153)
BUN/Creatinine Ratio: 34 (calc) — ABNORMAL HIGH (ref 6–22)
BUN: 40 mg/dL — ABNORMAL HIGH (ref 7–25)
CO2: 24 mmol/L (ref 20–32)
Calcium: 8.5 mg/dL — ABNORMAL LOW (ref 8.6–10.4)
Chloride: 109 mmol/L (ref 98–110)
Creat: 1.16 mg/dL — ABNORMAL HIGH (ref 0.60–0.95)
Globulin: 2.4 g/dL (ref 1.9–3.7)
Glucose, Bld: 97 mg/dL (ref 65–99)
Potassium: 4.2 mmol/L (ref 3.5–5.3)
Sodium: 141 mmol/L (ref 135–146)
Total Bilirubin: 0.3 mg/dL (ref 0.2–1.2)
Total Protein: 5.8 g/dL — ABNORMAL LOW (ref 6.1–8.1)
eGFR: 42 mL/min/{1.73_m2} — ABNORMAL LOW (ref >=60)

## 2023-10-28 LAB — MAGNESIUM: Magnesium: 2.2 mg/dL (ref 1.5–2.5)

## 2023-11-01 DIAGNOSIS — R278 Other lack of coordination: Secondary | ICD-10-CM | POA: Diagnosis not present

## 2023-11-01 DIAGNOSIS — Z9181 History of falling: Secondary | ICD-10-CM | POA: Diagnosis not present

## 2023-11-01 DIAGNOSIS — R2681 Unsteadiness on feet: Secondary | ICD-10-CM | POA: Diagnosis not present

## 2023-11-01 DIAGNOSIS — M6281 Muscle weakness (generalized): Secondary | ICD-10-CM | POA: Diagnosis not present

## 2023-11-02 ENCOUNTER — Encounter: Payer: Self-pay | Admitting: Internal Medicine

## 2023-11-02 ENCOUNTER — Non-Acute Institutional Stay: Payer: MEDICARE | Admitting: Internal Medicine

## 2023-11-02 VITALS — BP 130/60 | HR 60 | Temp 97.6°F | Resp 16 | Ht 65.0 in | Wt 159.6 lb

## 2023-11-02 DIAGNOSIS — H9190 Unspecified hearing loss, unspecified ear: Secondary | ICD-10-CM

## 2023-11-02 DIAGNOSIS — K219 Gastro-esophageal reflux disease without esophagitis: Secondary | ICD-10-CM | POA: Diagnosis not present

## 2023-11-02 DIAGNOSIS — Z853 Personal history of malignant neoplasm of breast: Secondary | ICD-10-CM

## 2023-11-02 DIAGNOSIS — I480 Paroxysmal atrial fibrillation: Secondary | ICD-10-CM

## 2023-11-02 DIAGNOSIS — G3184 Mild cognitive impairment, so stated: Secondary | ICD-10-CM

## 2023-11-02 DIAGNOSIS — E7800 Pure hypercholesterolemia, unspecified: Secondary | ICD-10-CM | POA: Diagnosis not present

## 2023-11-02 DIAGNOSIS — I5032 Chronic diastolic (congestive) heart failure: Secondary | ICD-10-CM | POA: Diagnosis not present

## 2023-11-02 DIAGNOSIS — N1832 Chronic kidney disease, stage 3b: Secondary | ICD-10-CM | POA: Diagnosis not present

## 2023-11-02 DIAGNOSIS — D631 Anemia in chronic kidney disease: Secondary | ICD-10-CM | POA: Diagnosis not present

## 2023-11-02 NOTE — Progress Notes (Signed)
 Location:  [facility] Service:  Clinic (12)  Provider:   Code Status: DNR Goals of Care:     11/02/2023   10:13 AM  Advanced Directives  Does Patient Have a Medical Advance Directive? Yes  Type of Estate agent of [place];Living will;Out of facility DNR (pink MOST or yellow form)  Does patient want to make changes to medical advance directive? No - Patient declined  Copy of Healthcare Power of Attorney in Chart? Yes - validated most recent copy scanned in chart (See row information)     Chief Complaint  Patient presents with   Medical Management of Chronic Issues    4 month follow up. Discuss the need for Shingrix vaccine, and Covid Booster.     HPI: Patient is a 89 y.o. female seen today for medical management of chronic diseases.   [place]ives [place] in [facility] with her husband   Patient has a history of A-fib on Eliquis, history of bilateral breast cancer s/p lumpectomy and restarted back on tamoxifen  CKD GERD Worsening Cognition  Patient is able to manage in her Apartment with the help of her Husband and Caregivers She needs help with Changing Clothes and Showering Able to walk with her walker Very HOH Short term memory is not good Repeats herself Very Pleasant Had No complains No Falls      Past Medical History:  Diagnosis Date   Allergic rhinitis, seasonal    Breast cancer of upper-outer quadrant of right female breast (HCC) 09/13/2014   Chronic venous insufficiency    Dermatophytosis of nail    Dysrhythmia    H/o Atrial fibrillation   Edema leg    Family history of malignant neoplasm of breast    Family history of malignant neoplasm of ovary    Goiter    right thyroid nodule    HTN (hypertension) 08/13/2017   HX: breast cancer    bilateral   Menopausal syndrome    Osteoarthritis    Osteoporosis    Squamous cell skin cancer    right lower leg   Wears glasses    Wears hearing aid    both ears    Past  Surgical History:  Procedure Laterality Date   bilateral lumpectomies for breast cancer  Aug. 2007   BREAST LUMPECTOMY WITH RADIOACTIVE SEED LOCALIZATION Right 10/01/2014   Procedure: BREAST LUMPECTOMY WITH RADIOACTIVE SEED LOCALIZATION;  Surgeon: Harriette Bouillon, MD;  Location: [facility];  Service: General;  Laterality: Right;   BREAST LUMPECTOMY WITH RADIOACTIVE SEED LOCALIZATION Left 03/22/2019   Procedure: LEFT BREAST LUMPECTOMY WITH RADIOACTIVE SEED LOCALIZATION;  Surgeon: Harriette Bouillon, MD;  Location: [facility];  Service: General;  Laterality: Left;   BREAST LUMPECTOMY WITH RADIOACTIVE SEED LOCALIZATION Right 08/18/2021   Procedure: RIGHT BREAST LUMPECTOMY WITH RADIOACTIVE SEED LOCALIZATION;  Surgeon: Harriette Bouillon, MD;  Location: [facility];  Service: General;  Laterality: Right;   CATARACT EXTRACTION     CHOLECYSTECTOMY N/A 08/13/2017   Procedure: LAPAROSCOPIC CHOLECYSTECTOMY WITH INTRAOPERATIVE CHOLANGIOGRAM;  Surgeon: Glenna Fellows, MD;  Location: [facility];  Service: General;  Laterality: N/A;   DILATION AND CURETTAGE OF UTERUS     ESOPHAGOGASTRODUODENOSCOPY  07/26/2011   Procedure: ESOPHAGOGASTRODUODENOSCOPY (EGD);  Surgeon: Petra Kuba, MD;  Location: [facility];  Service: Endoscopy;  Laterality: N/A;   history of lumbar compression fracture     left eardum sx  1983   left knee arthroscopic surgery     no screening  colonoscopy     s/p bilateral lumpectomies     SAVORY DILATION  07/26/2011   Procedure: SAVORY DILATION;  Surgeon: Mathew Solomon, MD;  Location: [facility];  Service: Endoscopy;  Laterality: N/A;   status post resection squamous cell cancer right lower leg     TONSILLECTOMY     UMBILICAL HERNIA REPAIR      Allergies  Allergen Reactions   Celecoxib Swelling    Swelling-leg    Outpatient Encounter Medications as of 11/02/2023  Medication Sig   apixaban (ELIQUIS) 5 MG TABS tablet TAKE 1 TABLET BY MOUTH TWICE A DAY   atorvastatin (LIPITOR) 40 MG tablet  TAKE 1 TABLET BY MOUTH EVERY DAY   famotidine (PEPCID) 20 MG tablet Take 20 mg by mouth daily.   furosemide (LASIX) 40 MG tablet TAKE 1.5 TABLETS BY MOUTH DAILY.   ipratropium (ATROVENT) 0.06 % nasal spray 2 sprays each nostril every 6 (SIX) hours to dry up nose.   Magnesium Oxide 250 MG TABS Take 1 tablet (250 mg total) by mouth daily.   metoprolol tartrate (LOPRESSOR) 25 MG tablet Take 1 tablet (25 mg total) by mouth every 8 (eight) hours as needed.   montelukast (SINGULAIR) 10 MG tablet TAKE 1 TABLET BY MOUTH AT NIGHT FOR COUGH OR WHEEZING   Multiple Vitamins-Minerals (PRESERVISION AREDS 2) CAPS Take 1 capsule by mouth 2 (two) times daily.    potassium chloride SA (KLOR-CON M) 20 MEQ tablet Take 1 tablet (20 mEq total) by mouth 2 (two) times daily.   tamoxifen (NOLVADEX) 20 MG tablet Take 1 tablet (20 mg total) by mouth daily.   metoprolol succinate (TOPROL-XL) 25 MG 24 hr tablet TAKE 1 TABLET BY MOUTH EVERYDAY AT BEDTIME (Patient not taking: Reported on 11/02/2023)   No facility-administered encounter medications on file as of 11/02/2023.    Review of Systems:  Review of Systems  Constitutional:  Negative for activity change and appetite change.  HENT: Negative.    Respiratory:  Negative for cough and shortness of breath.   Cardiovascular:  Positive for leg swelling.  Gastrointestinal:  Negative for constipation.  Genitourinary: Negative.   Musculoskeletal:  Positive for gait problem. Negative for arthralgias and myalgias.  Skin: Negative.   Neurological:  Negative for dizziness and weakness.  Psychiatric/Behavioral:  Positive for confusion. Negative for dysphoric mood and sleep disturbance.     Health Maintenance  Topic Date Due   Zoster Vaccines- Shingrix (1 of 2) 12/17/1940   COVID-19 Vaccine (5 - 2024-25 season) 03/20/2023   Medicare Annual Wellness (AWV)  01/03/2024   INFLUENZA VACCINE  02/17/2024   Pneumonia Vaccine 25+ Years old  Completed   DEXA SCAN  Completed   HPV  VACCINES  Aged Out   Meningococcal B Vaccine  Aged Out   DTaP/Tdap/Td  Discontinued   MAMMOGRAM  Discontinued    Physical Exam: Vitals:   11/02/23 1010  BP: 130/60  Pulse: 60  Resp: 16  Temp: 97.6 F (36.4 C)  SpO2: 96%  Weight: 159 lb 9.6 oz (72.4 kg)  Height: 5\' 5"  (1.651 m)   Body mass index is 26.56 kg/m. Physical Exam Vitals reviewed.  Constitutional:      Appearance: Normal appearance.  HENT:     Head: Normocephalic.     Nose: Nose normal.     Mouth/Throat:     Mouth: Mucous membranes are moist.     Pharynx: Oropharynx is clear.  Eyes:     Pupils: Pupils are equal, round, and reactive  to light.  Cardiovascular:     Rate and Rhythm: Normal rate and regular rhythm.     Pulses: Normal pulses.     Heart sounds: Normal heart sounds. No murmur heard. Pulmonary:     Effort: Pulmonary effort is normal.     Breath sounds: Normal breath sounds.  Abdominal:     General: Abdomen is flat. Bowel sounds are normal.     Palpations: Abdomen is soft.  Musculoskeletal:        General: Swelling present.     Cervical back: Neck supple.  Skin:    General: Skin is warm.  Neurological:     General: No focal deficit present.     Mental Status: She is alert.  Psychiatric:        Mood and Affect: Mood normal.        Thought Content: Thought content normal.    Labs reviewed: Basic Metabolic Panel: Recent Labs    11/08/22 0807 11/27/22 1456 12/27/22 0803 03/10/23 1557 06/23/23 0831 07/06/23 0259 10/27/23 0920  NA 143   < >  --    < > 141 141 141  K 3.7   < >  --    < > 4.5 3.7 4.2  CL 107   < >  --    < > 108 108 109  CO2 27   < >  --    < > 27 23 24   GLUCOSE 123*   < >  --    < > 112* 139* 97  BUN 41*   < >  --    < > 41* 39* 40*  CREATININE 1.22*   < >  --    < > 1.14* 1.30* 1.16*  CALCIUM 8.6   < >  --    < > 8.8 8.2* 8.5*  MG  --    < >  --    < > 2.3 2.1 2.2  TSH 6.70*  --  6.33*  --  3.84  --   --    < > = values in this interval not displayed.   Liver  Function Tests: Recent Labs    11/27/22 1456 03/10/23 1557 03/24/23 0810 06/23/23 0831 10/27/23 0920  AST 21 19 16 16 14   ALT 15 11 12 12 11   ALKPHOS 64 71  --   --   --   BILITOT 0.5 0.2* 0.3 0.4 0.3  PROT 5.7* 5.6* 5.8* 5.6* 5.8*  ALBUMIN 3.0* 2.9*  --   --   --    Recent Labs    11/27/22 1456  LIPASE 29   No results for input(s): "AMMONIA" in the last 8760 hours. CBC: Recent Labs    03/24/23 0810 06/23/23 0831 07/06/23 0259 10/27/23 0920  WBC 5.6 4.4 5.1 4.3  NEUTROABS 3,976 2,957  --  2,739  HGB 10.2* 10.9* 10.3* 10.7*  HCT 32.3* 35.3 33.9* 33.7*  MCV 82.8 85.5 88.5 85.8  PLT 213 187 171 188   Lipid Panel: Recent Labs    11/08/22 0807 06/23/23 0831  CHOL 124 121  HDL 47* 47*  LDLCALC 56 53  TRIG 128 120  CHOLHDL 2.6 2.6   Lab Results  Component Value Date   HGBA1C 5.8 (H) 01/01/2021    Procedures since last visit: No results found.  Assessment/Plan 1. Paroxysmal atrial fibrillation (HCC) (Primary) Eliquis and Toprol  2. Chronic diastolic heart failure (HCC) Lasix  3. Hypomagnesemia Magnesium supplement Magnesium level was normal  4. Gastroesophageal  reflux disease, unspecified whether esophagitis present Pepcid changed from Prilosec for better absorption of Magnesium  5. BREAST CANCER, HX OF S/p Right Lumpectomy DCIS On Tamoxifen Indefinitely  6. Hypercholesterolemia On statin  7. Mild cognitive impairment Able to function with help of her Husband She is going to turn 89 this year   8. HOH (hard of hearing)  9 LE edema On Lasix   Labs/tests ordered:  * No order type specified * Next appt:  Visit date not found

## 2023-11-02 NOTE — Patient Instructions (Signed)
 Please come to [facility] clinic on Monday 04/30/2024 at 7:45am for labs.

## 2023-11-03 DIAGNOSIS — M6281 Muscle weakness (generalized): Secondary | ICD-10-CM | POA: Diagnosis not present

## 2023-11-03 DIAGNOSIS — Z9181 History of falling: Secondary | ICD-10-CM | POA: Diagnosis not present

## 2023-11-03 DIAGNOSIS — R2681 Unsteadiness on feet: Secondary | ICD-10-CM | POA: Diagnosis not present

## 2023-11-03 DIAGNOSIS — R278 Other lack of coordination: Secondary | ICD-10-CM | POA: Diagnosis not present

## 2023-11-07 ENCOUNTER — Other Ambulatory Visit: Payer: Self-pay | Admitting: Internal Medicine

## 2023-11-08 DIAGNOSIS — M6281 Muscle weakness (generalized): Secondary | ICD-10-CM | POA: Diagnosis not present

## 2023-11-08 DIAGNOSIS — R2681 Unsteadiness on feet: Secondary | ICD-10-CM | POA: Diagnosis not present

## 2023-11-08 DIAGNOSIS — R278 Other lack of coordination: Secondary | ICD-10-CM | POA: Diagnosis not present

## 2023-11-08 DIAGNOSIS — Z9181 History of falling: Secondary | ICD-10-CM | POA: Diagnosis not present

## 2023-11-09 DIAGNOSIS — M6281 Muscle weakness (generalized): Secondary | ICD-10-CM | POA: Diagnosis not present

## 2023-11-09 DIAGNOSIS — Z9181 History of falling: Secondary | ICD-10-CM | POA: Diagnosis not present

## 2023-11-09 DIAGNOSIS — R278 Other lack of coordination: Secondary | ICD-10-CM | POA: Diagnosis not present

## 2023-11-09 DIAGNOSIS — R2681 Unsteadiness on feet: Secondary | ICD-10-CM | POA: Diagnosis not present

## 2023-11-14 DIAGNOSIS — Z9181 History of falling: Secondary | ICD-10-CM | POA: Diagnosis not present

## 2023-11-14 DIAGNOSIS — R2681 Unsteadiness on feet: Secondary | ICD-10-CM | POA: Diagnosis not present

## 2023-11-14 DIAGNOSIS — M6281 Muscle weakness (generalized): Secondary | ICD-10-CM | POA: Diagnosis not present

## 2023-11-14 DIAGNOSIS — R278 Other lack of coordination: Secondary | ICD-10-CM | POA: Diagnosis not present

## 2023-11-16 DIAGNOSIS — M6281 Muscle weakness (generalized): Secondary | ICD-10-CM | POA: Diagnosis not present

## 2023-11-16 DIAGNOSIS — H353211 Exudative age-related macular degeneration, right eye, with active choroidal neovascularization: Secondary | ICD-10-CM | POA: Diagnosis not present

## 2023-11-16 DIAGNOSIS — Z9181 History of falling: Secondary | ICD-10-CM | POA: Diagnosis not present

## 2023-11-16 DIAGNOSIS — H04123 Dry eye syndrome of bilateral lacrimal glands: Secondary | ICD-10-CM | POA: Diagnosis not present

## 2023-11-16 DIAGNOSIS — Z961 Presence of intraocular lens: Secondary | ICD-10-CM | POA: Diagnosis not present

## 2023-11-16 DIAGNOSIS — H353122 Nonexudative age-related macular degeneration, left eye, intermediate dry stage: Secondary | ICD-10-CM | POA: Diagnosis not present

## 2023-11-16 DIAGNOSIS — R278 Other lack of coordination: Secondary | ICD-10-CM | POA: Diagnosis not present

## 2023-11-16 DIAGNOSIS — R2681 Unsteadiness on feet: Secondary | ICD-10-CM | POA: Diagnosis not present

## 2023-11-22 DIAGNOSIS — R41841 Cognitive communication deficit: Secondary | ICD-10-CM | POA: Diagnosis not present

## 2023-11-22 DIAGNOSIS — Z9181 History of falling: Secondary | ICD-10-CM | POA: Diagnosis not present

## 2023-11-22 DIAGNOSIS — M6281 Muscle weakness (generalized): Secondary | ICD-10-CM | POA: Diagnosis not present

## 2023-11-22 DIAGNOSIS — R2681 Unsteadiness on feet: Secondary | ICD-10-CM | POA: Diagnosis not present

## 2023-11-22 DIAGNOSIS — R1312 Dysphagia, oropharyngeal phase: Secondary | ICD-10-CM | POA: Diagnosis not present

## 2023-11-22 DIAGNOSIS — R278 Other lack of coordination: Secondary | ICD-10-CM | POA: Diagnosis not present

## 2023-11-23 ENCOUNTER — Other Ambulatory Visit: Payer: Self-pay | Admitting: Cardiovascular Disease

## 2023-11-23 DIAGNOSIS — R1312 Dysphagia, oropharyngeal phase: Secondary | ICD-10-CM | POA: Diagnosis not present

## 2023-11-23 DIAGNOSIS — M6281 Muscle weakness (generalized): Secondary | ICD-10-CM | POA: Diagnosis not present

## 2023-11-23 DIAGNOSIS — R41841 Cognitive communication deficit: Secondary | ICD-10-CM | POA: Diagnosis not present

## 2023-11-23 DIAGNOSIS — R278 Other lack of coordination: Secondary | ICD-10-CM | POA: Diagnosis not present

## 2023-11-23 DIAGNOSIS — R2681 Unsteadiness on feet: Secondary | ICD-10-CM | POA: Diagnosis not present

## 2023-11-23 DIAGNOSIS — Z9181 History of falling: Secondary | ICD-10-CM | POA: Diagnosis not present

## 2023-11-29 DIAGNOSIS — R41841 Cognitive communication deficit: Secondary | ICD-10-CM | POA: Diagnosis not present

## 2023-11-29 DIAGNOSIS — M6281 Muscle weakness (generalized): Secondary | ICD-10-CM | POA: Diagnosis not present

## 2023-11-29 DIAGNOSIS — Z9181 History of falling: Secondary | ICD-10-CM | POA: Diagnosis not present

## 2023-11-29 DIAGNOSIS — R2681 Unsteadiness on feet: Secondary | ICD-10-CM | POA: Diagnosis not present

## 2023-11-29 DIAGNOSIS — R1312 Dysphagia, oropharyngeal phase: Secondary | ICD-10-CM | POA: Diagnosis not present

## 2023-11-29 DIAGNOSIS — R278 Other lack of coordination: Secondary | ICD-10-CM | POA: Diagnosis not present

## 2023-11-30 DIAGNOSIS — Z9181 History of falling: Secondary | ICD-10-CM | POA: Diagnosis not present

## 2023-11-30 DIAGNOSIS — M6281 Muscle weakness (generalized): Secondary | ICD-10-CM | POA: Diagnosis not present

## 2023-11-30 DIAGNOSIS — R41841 Cognitive communication deficit: Secondary | ICD-10-CM | POA: Diagnosis not present

## 2023-11-30 DIAGNOSIS — R1312 Dysphagia, oropharyngeal phase: Secondary | ICD-10-CM | POA: Diagnosis not present

## 2023-11-30 DIAGNOSIS — R278 Other lack of coordination: Secondary | ICD-10-CM | POA: Diagnosis not present

## 2023-11-30 DIAGNOSIS — R2681 Unsteadiness on feet: Secondary | ICD-10-CM | POA: Diagnosis not present

## 2023-12-02 DIAGNOSIS — R278 Other lack of coordination: Secondary | ICD-10-CM | POA: Diagnosis not present

## 2023-12-02 DIAGNOSIS — R41841 Cognitive communication deficit: Secondary | ICD-10-CM | POA: Diagnosis not present

## 2023-12-02 DIAGNOSIS — R2681 Unsteadiness on feet: Secondary | ICD-10-CM | POA: Diagnosis not present

## 2023-12-02 DIAGNOSIS — Z9181 History of falling: Secondary | ICD-10-CM | POA: Diagnosis not present

## 2023-12-02 DIAGNOSIS — M6281 Muscle weakness (generalized): Secondary | ICD-10-CM | POA: Diagnosis not present

## 2023-12-02 DIAGNOSIS — R1312 Dysphagia, oropharyngeal phase: Secondary | ICD-10-CM | POA: Diagnosis not present

## 2023-12-05 DIAGNOSIS — R41841 Cognitive communication deficit: Secondary | ICD-10-CM | POA: Diagnosis not present

## 2023-12-05 DIAGNOSIS — Z9181 History of falling: Secondary | ICD-10-CM | POA: Diagnosis not present

## 2023-12-05 DIAGNOSIS — M6281 Muscle weakness (generalized): Secondary | ICD-10-CM | POA: Diagnosis not present

## 2023-12-05 DIAGNOSIS — R278 Other lack of coordination: Secondary | ICD-10-CM | POA: Diagnosis not present

## 2023-12-05 DIAGNOSIS — R2681 Unsteadiness on feet: Secondary | ICD-10-CM | POA: Diagnosis not present

## 2023-12-05 DIAGNOSIS — R1312 Dysphagia, oropharyngeal phase: Secondary | ICD-10-CM | POA: Diagnosis not present

## 2023-12-06 DIAGNOSIS — M6281 Muscle weakness (generalized): Secondary | ICD-10-CM | POA: Diagnosis not present

## 2023-12-06 DIAGNOSIS — R41841 Cognitive communication deficit: Secondary | ICD-10-CM | POA: Diagnosis not present

## 2023-12-06 DIAGNOSIS — R2681 Unsteadiness on feet: Secondary | ICD-10-CM | POA: Diagnosis not present

## 2023-12-06 DIAGNOSIS — Z9181 History of falling: Secondary | ICD-10-CM | POA: Diagnosis not present

## 2023-12-06 DIAGNOSIS — R1312 Dysphagia, oropharyngeal phase: Secondary | ICD-10-CM | POA: Diagnosis not present

## 2023-12-06 DIAGNOSIS — R278 Other lack of coordination: Secondary | ICD-10-CM | POA: Diagnosis not present

## 2023-12-07 DIAGNOSIS — Z9181 History of falling: Secondary | ICD-10-CM | POA: Diagnosis not present

## 2023-12-07 DIAGNOSIS — R2681 Unsteadiness on feet: Secondary | ICD-10-CM | POA: Diagnosis not present

## 2023-12-07 DIAGNOSIS — R41841 Cognitive communication deficit: Secondary | ICD-10-CM | POA: Diagnosis not present

## 2023-12-07 DIAGNOSIS — R278 Other lack of coordination: Secondary | ICD-10-CM | POA: Diagnosis not present

## 2023-12-07 DIAGNOSIS — R1312 Dysphagia, oropharyngeal phase: Secondary | ICD-10-CM | POA: Diagnosis not present

## 2023-12-07 DIAGNOSIS — M6281 Muscle weakness (generalized): Secondary | ICD-10-CM | POA: Diagnosis not present

## 2023-12-11 DIAGNOSIS — R278 Other lack of coordination: Secondary | ICD-10-CM | POA: Diagnosis not present

## 2023-12-11 DIAGNOSIS — M6281 Muscle weakness (generalized): Secondary | ICD-10-CM | POA: Diagnosis not present

## 2023-12-11 DIAGNOSIS — R41841 Cognitive communication deficit: Secondary | ICD-10-CM | POA: Diagnosis not present

## 2023-12-11 DIAGNOSIS — Z9181 History of falling: Secondary | ICD-10-CM | POA: Diagnosis not present

## 2023-12-11 DIAGNOSIS — R1312 Dysphagia, oropharyngeal phase: Secondary | ICD-10-CM | POA: Diagnosis not present

## 2023-12-11 DIAGNOSIS — R2681 Unsteadiness on feet: Secondary | ICD-10-CM | POA: Diagnosis not present

## 2023-12-13 DIAGNOSIS — M6281 Muscle weakness (generalized): Secondary | ICD-10-CM | POA: Diagnosis not present

## 2023-12-13 DIAGNOSIS — R2681 Unsteadiness on feet: Secondary | ICD-10-CM | POA: Diagnosis not present

## 2023-12-13 DIAGNOSIS — R1312 Dysphagia, oropharyngeal phase: Secondary | ICD-10-CM | POA: Diagnosis not present

## 2023-12-13 DIAGNOSIS — R41841 Cognitive communication deficit: Secondary | ICD-10-CM | POA: Diagnosis not present

## 2023-12-13 DIAGNOSIS — Z9181 History of falling: Secondary | ICD-10-CM | POA: Diagnosis not present

## 2023-12-13 DIAGNOSIS — R278 Other lack of coordination: Secondary | ICD-10-CM | POA: Diagnosis not present

## 2023-12-14 DIAGNOSIS — R278 Other lack of coordination: Secondary | ICD-10-CM | POA: Diagnosis not present

## 2023-12-14 DIAGNOSIS — R41841 Cognitive communication deficit: Secondary | ICD-10-CM | POA: Diagnosis not present

## 2023-12-14 DIAGNOSIS — Z9181 History of falling: Secondary | ICD-10-CM | POA: Diagnosis not present

## 2023-12-14 DIAGNOSIS — R2681 Unsteadiness on feet: Secondary | ICD-10-CM | POA: Diagnosis not present

## 2023-12-14 DIAGNOSIS — R1312 Dysphagia, oropharyngeal phase: Secondary | ICD-10-CM | POA: Diagnosis not present

## 2023-12-14 DIAGNOSIS — M6281 Muscle weakness (generalized): Secondary | ICD-10-CM | POA: Diagnosis not present

## 2023-12-15 DIAGNOSIS — M6281 Muscle weakness (generalized): Secondary | ICD-10-CM | POA: Diagnosis not present

## 2023-12-15 DIAGNOSIS — R41841 Cognitive communication deficit: Secondary | ICD-10-CM | POA: Diagnosis not present

## 2023-12-15 DIAGNOSIS — R278 Other lack of coordination: Secondary | ICD-10-CM | POA: Diagnosis not present

## 2023-12-15 DIAGNOSIS — R2681 Unsteadiness on feet: Secondary | ICD-10-CM | POA: Diagnosis not present

## 2023-12-15 DIAGNOSIS — R1312 Dysphagia, oropharyngeal phase: Secondary | ICD-10-CM | POA: Diagnosis not present

## 2023-12-15 DIAGNOSIS — Z9181 History of falling: Secondary | ICD-10-CM | POA: Diagnosis not present

## 2023-12-19 DIAGNOSIS — M6281 Muscle weakness (generalized): Secondary | ICD-10-CM | POA: Diagnosis not present

## 2023-12-19 DIAGNOSIS — R41841 Cognitive communication deficit: Secondary | ICD-10-CM | POA: Diagnosis not present

## 2023-12-19 DIAGNOSIS — R2681 Unsteadiness on feet: Secondary | ICD-10-CM | POA: Diagnosis not present

## 2023-12-19 DIAGNOSIS — R278 Other lack of coordination: Secondary | ICD-10-CM | POA: Diagnosis not present

## 2023-12-19 DIAGNOSIS — Z9181 History of falling: Secondary | ICD-10-CM | POA: Diagnosis not present

## 2023-12-19 DIAGNOSIS — R1312 Dysphagia, oropharyngeal phase: Secondary | ICD-10-CM | POA: Diagnosis not present

## 2023-12-20 ENCOUNTER — Other Ambulatory Visit: Payer: Self-pay | Admitting: Cardiovascular Disease

## 2023-12-20 DIAGNOSIS — R278 Other lack of coordination: Secondary | ICD-10-CM | POA: Diagnosis not present

## 2023-12-20 DIAGNOSIS — R1312 Dysphagia, oropharyngeal phase: Secondary | ICD-10-CM | POA: Diagnosis not present

## 2023-12-20 DIAGNOSIS — M6281 Muscle weakness (generalized): Secondary | ICD-10-CM | POA: Diagnosis not present

## 2023-12-20 DIAGNOSIS — R2681 Unsteadiness on feet: Secondary | ICD-10-CM | POA: Diagnosis not present

## 2023-12-20 DIAGNOSIS — Z9181 History of falling: Secondary | ICD-10-CM | POA: Diagnosis not present

## 2023-12-20 DIAGNOSIS — R41841 Cognitive communication deficit: Secondary | ICD-10-CM | POA: Diagnosis not present

## 2023-12-20 NOTE — Telephone Encounter (Signed)
 Prescription refill request for Eliquis  received. Indication:AFIB Last office visit:1/25 Scr:1.16  4/25 Age: 89 Weight:72.4  kg  Prescription refilled

## 2023-12-21 DIAGNOSIS — R2681 Unsteadiness on feet: Secondary | ICD-10-CM | POA: Diagnosis not present

## 2023-12-21 DIAGNOSIS — R278 Other lack of coordination: Secondary | ICD-10-CM | POA: Diagnosis not present

## 2023-12-21 DIAGNOSIS — M6281 Muscle weakness (generalized): Secondary | ICD-10-CM | POA: Diagnosis not present

## 2023-12-21 DIAGNOSIS — R1312 Dysphagia, oropharyngeal phase: Secondary | ICD-10-CM | POA: Diagnosis not present

## 2023-12-21 DIAGNOSIS — R41841 Cognitive communication deficit: Secondary | ICD-10-CM | POA: Diagnosis not present

## 2023-12-21 DIAGNOSIS — Z9181 History of falling: Secondary | ICD-10-CM | POA: Diagnosis not present

## 2023-12-26 DIAGNOSIS — Z9181 History of falling: Secondary | ICD-10-CM | POA: Diagnosis not present

## 2023-12-26 DIAGNOSIS — R41841 Cognitive communication deficit: Secondary | ICD-10-CM | POA: Diagnosis not present

## 2023-12-26 DIAGNOSIS — R2681 Unsteadiness on feet: Secondary | ICD-10-CM | POA: Diagnosis not present

## 2023-12-26 DIAGNOSIS — R278 Other lack of coordination: Secondary | ICD-10-CM | POA: Diagnosis not present

## 2023-12-26 DIAGNOSIS — M6281 Muscle weakness (generalized): Secondary | ICD-10-CM | POA: Diagnosis not present

## 2023-12-26 DIAGNOSIS — R1312 Dysphagia, oropharyngeal phase: Secondary | ICD-10-CM | POA: Diagnosis not present

## 2023-12-27 DIAGNOSIS — R41841 Cognitive communication deficit: Secondary | ICD-10-CM | POA: Diagnosis not present

## 2023-12-27 DIAGNOSIS — R2681 Unsteadiness on feet: Secondary | ICD-10-CM | POA: Diagnosis not present

## 2023-12-27 DIAGNOSIS — Z9181 History of falling: Secondary | ICD-10-CM | POA: Diagnosis not present

## 2023-12-27 DIAGNOSIS — M6281 Muscle weakness (generalized): Secondary | ICD-10-CM | POA: Diagnosis not present

## 2023-12-27 DIAGNOSIS — R1312 Dysphagia, oropharyngeal phase: Secondary | ICD-10-CM | POA: Diagnosis not present

## 2023-12-27 DIAGNOSIS — R278 Other lack of coordination: Secondary | ICD-10-CM | POA: Diagnosis not present

## 2023-12-28 DIAGNOSIS — R41841 Cognitive communication deficit: Secondary | ICD-10-CM | POA: Diagnosis not present

## 2023-12-28 DIAGNOSIS — Z9181 History of falling: Secondary | ICD-10-CM | POA: Diagnosis not present

## 2023-12-28 DIAGNOSIS — M6281 Muscle weakness (generalized): Secondary | ICD-10-CM | POA: Diagnosis not present

## 2023-12-28 DIAGNOSIS — R2681 Unsteadiness on feet: Secondary | ICD-10-CM | POA: Diagnosis not present

## 2023-12-28 DIAGNOSIS — R1312 Dysphagia, oropharyngeal phase: Secondary | ICD-10-CM | POA: Diagnosis not present

## 2023-12-28 DIAGNOSIS — R278 Other lack of coordination: Secondary | ICD-10-CM | POA: Diagnosis not present

## 2023-12-30 DIAGNOSIS — R2681 Unsteadiness on feet: Secondary | ICD-10-CM | POA: Diagnosis not present

## 2023-12-30 DIAGNOSIS — Z9181 History of falling: Secondary | ICD-10-CM | POA: Diagnosis not present

## 2023-12-30 DIAGNOSIS — R278 Other lack of coordination: Secondary | ICD-10-CM | POA: Diagnosis not present

## 2023-12-30 DIAGNOSIS — M6281 Muscle weakness (generalized): Secondary | ICD-10-CM | POA: Diagnosis not present

## 2023-12-30 DIAGNOSIS — R1312 Dysphagia, oropharyngeal phase: Secondary | ICD-10-CM | POA: Diagnosis not present

## 2023-12-30 DIAGNOSIS — R41841 Cognitive communication deficit: Secondary | ICD-10-CM | POA: Diagnosis not present

## 2024-01-02 DIAGNOSIS — R41841 Cognitive communication deficit: Secondary | ICD-10-CM | POA: Diagnosis not present

## 2024-01-02 DIAGNOSIS — R2681 Unsteadiness on feet: Secondary | ICD-10-CM | POA: Diagnosis not present

## 2024-01-02 DIAGNOSIS — R278 Other lack of coordination: Secondary | ICD-10-CM | POA: Diagnosis not present

## 2024-01-02 DIAGNOSIS — Z9181 History of falling: Secondary | ICD-10-CM | POA: Diagnosis not present

## 2024-01-02 DIAGNOSIS — R1312 Dysphagia, oropharyngeal phase: Secondary | ICD-10-CM | POA: Diagnosis not present

## 2024-01-02 DIAGNOSIS — M6281 Muscle weakness (generalized): Secondary | ICD-10-CM | POA: Diagnosis not present

## 2024-01-03 DIAGNOSIS — Z9181 History of falling: Secondary | ICD-10-CM | POA: Diagnosis not present

## 2024-01-03 DIAGNOSIS — M6281 Muscle weakness (generalized): Secondary | ICD-10-CM | POA: Diagnosis not present

## 2024-01-03 DIAGNOSIS — R1312 Dysphagia, oropharyngeal phase: Secondary | ICD-10-CM | POA: Diagnosis not present

## 2024-01-03 DIAGNOSIS — R278 Other lack of coordination: Secondary | ICD-10-CM | POA: Diagnosis not present

## 2024-01-03 DIAGNOSIS — R41841 Cognitive communication deficit: Secondary | ICD-10-CM | POA: Diagnosis not present

## 2024-01-03 DIAGNOSIS — R2681 Unsteadiness on feet: Secondary | ICD-10-CM | POA: Diagnosis not present

## 2024-01-04 DIAGNOSIS — M6281 Muscle weakness (generalized): Secondary | ICD-10-CM | POA: Diagnosis not present

## 2024-01-04 DIAGNOSIS — R2681 Unsteadiness on feet: Secondary | ICD-10-CM | POA: Diagnosis not present

## 2024-01-04 DIAGNOSIS — R278 Other lack of coordination: Secondary | ICD-10-CM | POA: Diagnosis not present

## 2024-01-04 DIAGNOSIS — R41841 Cognitive communication deficit: Secondary | ICD-10-CM | POA: Diagnosis not present

## 2024-01-04 DIAGNOSIS — R1312 Dysphagia, oropharyngeal phase: Secondary | ICD-10-CM | POA: Diagnosis not present

## 2024-01-04 DIAGNOSIS — Z9181 History of falling: Secondary | ICD-10-CM | POA: Diagnosis not present

## 2024-01-09 DIAGNOSIS — M6281 Muscle weakness (generalized): Secondary | ICD-10-CM | POA: Diagnosis not present

## 2024-01-09 DIAGNOSIS — R1312 Dysphagia, oropharyngeal phase: Secondary | ICD-10-CM | POA: Diagnosis not present

## 2024-01-09 DIAGNOSIS — R2681 Unsteadiness on feet: Secondary | ICD-10-CM | POA: Diagnosis not present

## 2024-01-09 DIAGNOSIS — Z9181 History of falling: Secondary | ICD-10-CM | POA: Diagnosis not present

## 2024-01-09 DIAGNOSIS — R41841 Cognitive communication deficit: Secondary | ICD-10-CM | POA: Diagnosis not present

## 2024-01-09 DIAGNOSIS — R278 Other lack of coordination: Secondary | ICD-10-CM | POA: Diagnosis not present

## 2024-01-10 ENCOUNTER — Telehealth: Payer: Self-pay | Admitting: Podiatry

## 2024-01-10 DIAGNOSIS — R2681 Unsteadiness on feet: Secondary | ICD-10-CM | POA: Diagnosis not present

## 2024-01-10 DIAGNOSIS — R278 Other lack of coordination: Secondary | ICD-10-CM | POA: Diagnosis not present

## 2024-01-10 DIAGNOSIS — R1312 Dysphagia, oropharyngeal phase: Secondary | ICD-10-CM | POA: Diagnosis not present

## 2024-01-10 DIAGNOSIS — R41841 Cognitive communication deficit: Secondary | ICD-10-CM | POA: Diagnosis not present

## 2024-01-10 DIAGNOSIS — M6281 Muscle weakness (generalized): Secondary | ICD-10-CM | POA: Diagnosis not present

## 2024-01-10 DIAGNOSIS — Z9181 History of falling: Secondary | ICD-10-CM | POA: Diagnosis not present

## 2024-01-10 NOTE — Telephone Encounter (Signed)
 Called and spoke to patient's husband and rescheduled her 7/3 appointment to 7/15. Due to doctor not being in office

## 2024-01-11 DIAGNOSIS — R2681 Unsteadiness on feet: Secondary | ICD-10-CM | POA: Diagnosis not present

## 2024-01-11 DIAGNOSIS — M6281 Muscle weakness (generalized): Secondary | ICD-10-CM | POA: Diagnosis not present

## 2024-01-11 DIAGNOSIS — R1312 Dysphagia, oropharyngeal phase: Secondary | ICD-10-CM | POA: Diagnosis not present

## 2024-01-11 DIAGNOSIS — R278 Other lack of coordination: Secondary | ICD-10-CM | POA: Diagnosis not present

## 2024-01-11 DIAGNOSIS — R41841 Cognitive communication deficit: Secondary | ICD-10-CM | POA: Diagnosis not present

## 2024-01-11 DIAGNOSIS — Z9181 History of falling: Secondary | ICD-10-CM | POA: Diagnosis not present

## 2024-01-16 ENCOUNTER — Other Ambulatory Visit: Payer: Self-pay | Admitting: Internal Medicine

## 2024-01-16 DIAGNOSIS — R41841 Cognitive communication deficit: Secondary | ICD-10-CM | POA: Diagnosis not present

## 2024-01-16 DIAGNOSIS — R1312 Dysphagia, oropharyngeal phase: Secondary | ICD-10-CM | POA: Diagnosis not present

## 2024-01-16 DIAGNOSIS — Z9181 History of falling: Secondary | ICD-10-CM | POA: Diagnosis not present

## 2024-01-16 DIAGNOSIS — R278 Other lack of coordination: Secondary | ICD-10-CM | POA: Diagnosis not present

## 2024-01-16 DIAGNOSIS — R2681 Unsteadiness on feet: Secondary | ICD-10-CM | POA: Diagnosis not present

## 2024-01-16 DIAGNOSIS — M6281 Muscle weakness (generalized): Secondary | ICD-10-CM | POA: Diagnosis not present

## 2024-01-17 DIAGNOSIS — R278 Other lack of coordination: Secondary | ICD-10-CM | POA: Diagnosis not present

## 2024-01-17 DIAGNOSIS — R2681 Unsteadiness on feet: Secondary | ICD-10-CM | POA: Diagnosis not present

## 2024-01-17 DIAGNOSIS — Z9181 History of falling: Secondary | ICD-10-CM | POA: Diagnosis not present

## 2024-01-17 DIAGNOSIS — M6281 Muscle weakness (generalized): Secondary | ICD-10-CM | POA: Diagnosis not present

## 2024-01-17 DIAGNOSIS — R1312 Dysphagia, oropharyngeal phase: Secondary | ICD-10-CM | POA: Diagnosis not present

## 2024-01-17 DIAGNOSIS — R41841 Cognitive communication deficit: Secondary | ICD-10-CM | POA: Diagnosis not present

## 2024-01-18 DIAGNOSIS — R1312 Dysphagia, oropharyngeal phase: Secondary | ICD-10-CM | POA: Diagnosis not present

## 2024-01-18 DIAGNOSIS — M6281 Muscle weakness (generalized): Secondary | ICD-10-CM | POA: Diagnosis not present

## 2024-01-18 DIAGNOSIS — Z9181 History of falling: Secondary | ICD-10-CM | POA: Diagnosis not present

## 2024-01-18 DIAGNOSIS — R41841 Cognitive communication deficit: Secondary | ICD-10-CM | POA: Diagnosis not present

## 2024-01-18 DIAGNOSIS — R2681 Unsteadiness on feet: Secondary | ICD-10-CM | POA: Diagnosis not present

## 2024-01-18 DIAGNOSIS — R278 Other lack of coordination: Secondary | ICD-10-CM | POA: Diagnosis not present

## 2024-01-19 ENCOUNTER — Ambulatory Visit: Payer: MEDICARE | Admitting: Podiatry

## 2024-01-23 ENCOUNTER — Other Ambulatory Visit: Payer: Self-pay | Admitting: Cardiovascular Disease

## 2024-01-23 DIAGNOSIS — R1312 Dysphagia, oropharyngeal phase: Secondary | ICD-10-CM | POA: Diagnosis not present

## 2024-01-23 DIAGNOSIS — M6281 Muscle weakness (generalized): Secondary | ICD-10-CM | POA: Diagnosis not present

## 2024-01-23 DIAGNOSIS — R2681 Unsteadiness on feet: Secondary | ICD-10-CM | POA: Diagnosis not present

## 2024-01-23 DIAGNOSIS — Z9181 History of falling: Secondary | ICD-10-CM | POA: Diagnosis not present

## 2024-01-23 DIAGNOSIS — R278 Other lack of coordination: Secondary | ICD-10-CM | POA: Diagnosis not present

## 2024-01-23 DIAGNOSIS — R41841 Cognitive communication deficit: Secondary | ICD-10-CM | POA: Diagnosis not present

## 2024-01-24 DIAGNOSIS — R278 Other lack of coordination: Secondary | ICD-10-CM | POA: Diagnosis not present

## 2024-01-24 DIAGNOSIS — R41841 Cognitive communication deficit: Secondary | ICD-10-CM | POA: Diagnosis not present

## 2024-01-24 DIAGNOSIS — R2681 Unsteadiness on feet: Secondary | ICD-10-CM | POA: Diagnosis not present

## 2024-01-24 DIAGNOSIS — Z9181 History of falling: Secondary | ICD-10-CM | POA: Diagnosis not present

## 2024-01-24 DIAGNOSIS — M6281 Muscle weakness (generalized): Secondary | ICD-10-CM | POA: Diagnosis not present

## 2024-01-24 DIAGNOSIS — R1312 Dysphagia, oropharyngeal phase: Secondary | ICD-10-CM | POA: Diagnosis not present

## 2024-01-25 DIAGNOSIS — R1312 Dysphagia, oropharyngeal phase: Secondary | ICD-10-CM | POA: Diagnosis not present

## 2024-01-25 DIAGNOSIS — Z9181 History of falling: Secondary | ICD-10-CM | POA: Diagnosis not present

## 2024-01-25 DIAGNOSIS — R41841 Cognitive communication deficit: Secondary | ICD-10-CM | POA: Diagnosis not present

## 2024-01-25 DIAGNOSIS — R278 Other lack of coordination: Secondary | ICD-10-CM | POA: Diagnosis not present

## 2024-01-25 DIAGNOSIS — M6281 Muscle weakness (generalized): Secondary | ICD-10-CM | POA: Diagnosis not present

## 2024-01-25 DIAGNOSIS — R2681 Unsteadiness on feet: Secondary | ICD-10-CM | POA: Diagnosis not present

## 2024-01-30 DIAGNOSIS — R2681 Unsteadiness on feet: Secondary | ICD-10-CM | POA: Diagnosis not present

## 2024-01-30 DIAGNOSIS — R278 Other lack of coordination: Secondary | ICD-10-CM | POA: Diagnosis not present

## 2024-01-30 DIAGNOSIS — R41841 Cognitive communication deficit: Secondary | ICD-10-CM | POA: Diagnosis not present

## 2024-01-30 DIAGNOSIS — R1312 Dysphagia, oropharyngeal phase: Secondary | ICD-10-CM | POA: Diagnosis not present

## 2024-01-30 DIAGNOSIS — M6281 Muscle weakness (generalized): Secondary | ICD-10-CM | POA: Diagnosis not present

## 2024-01-30 DIAGNOSIS — Z9181 History of falling: Secondary | ICD-10-CM | POA: Diagnosis not present

## 2024-01-31 ENCOUNTER — Encounter: Payer: Self-pay | Admitting: Podiatry

## 2024-01-31 ENCOUNTER — Ambulatory Visit (INDEPENDENT_AMBULATORY_CARE_PROVIDER_SITE_OTHER): Payer: MEDICARE | Admitting: Podiatry

## 2024-01-31 DIAGNOSIS — D2371 Other benign neoplasm of skin of right lower limb, including hip: Secondary | ICD-10-CM

## 2024-01-31 DIAGNOSIS — B351 Tinea unguium: Secondary | ICD-10-CM

## 2024-01-31 DIAGNOSIS — R41841 Cognitive communication deficit: Secondary | ICD-10-CM | POA: Diagnosis not present

## 2024-01-31 DIAGNOSIS — D689 Coagulation defect, unspecified: Secondary | ICD-10-CM | POA: Diagnosis not present

## 2024-01-31 DIAGNOSIS — Z9181 History of falling: Secondary | ICD-10-CM | POA: Diagnosis not present

## 2024-01-31 DIAGNOSIS — M79676 Pain in unspecified toe(s): Secondary | ICD-10-CM | POA: Diagnosis not present

## 2024-01-31 DIAGNOSIS — R278 Other lack of coordination: Secondary | ICD-10-CM | POA: Diagnosis not present

## 2024-01-31 DIAGNOSIS — D2372 Other benign neoplasm of skin of left lower limb, including hip: Secondary | ICD-10-CM

## 2024-01-31 DIAGNOSIS — M6281 Muscle weakness (generalized): Secondary | ICD-10-CM | POA: Diagnosis not present

## 2024-01-31 DIAGNOSIS — R1312 Dysphagia, oropharyngeal phase: Secondary | ICD-10-CM | POA: Diagnosis not present

## 2024-01-31 DIAGNOSIS — R2681 Unsteadiness on feet: Secondary | ICD-10-CM | POA: Diagnosis not present

## 2024-01-31 NOTE — Progress Notes (Signed)
 She presents today chief complaint of painful elongated toenails and calluses bilater al  Objective: Vital signs are stable alert oriented x 3.  Toenails are long thick yellow dystrophic with mycotic multiple benign skin lesions forefoot bilateral.  No open lesions or wounds are noted.  Assessment: Pain in limb secondary to onychomycosis 1 through 5 bilateral.  Painful benign soft tissue lesions bilateral.  Plan: Debridement of toenails 1 through 5 bilateral debridement of benign skin lesions.  Follow-up with her in 3 months

## 2024-02-01 DIAGNOSIS — M6281 Muscle weakness (generalized): Secondary | ICD-10-CM | POA: Diagnosis not present

## 2024-02-01 DIAGNOSIS — R1312 Dysphagia, oropharyngeal phase: Secondary | ICD-10-CM | POA: Diagnosis not present

## 2024-02-01 DIAGNOSIS — R41841 Cognitive communication deficit: Secondary | ICD-10-CM | POA: Diagnosis not present

## 2024-02-01 DIAGNOSIS — R278 Other lack of coordination: Secondary | ICD-10-CM | POA: Diagnosis not present

## 2024-02-01 DIAGNOSIS — Z9181 History of falling: Secondary | ICD-10-CM | POA: Diagnosis not present

## 2024-02-01 DIAGNOSIS — R2681 Unsteadiness on feet: Secondary | ICD-10-CM | POA: Diagnosis not present

## 2024-02-04 ENCOUNTER — Other Ambulatory Visit: Payer: Self-pay | Admitting: Emergency Medicine

## 2024-02-04 DIAGNOSIS — I10 Essential (primary) hypertension: Secondary | ICD-10-CM

## 2024-02-06 DIAGNOSIS — M6281 Muscle weakness (generalized): Secondary | ICD-10-CM | POA: Diagnosis not present

## 2024-02-06 DIAGNOSIS — R278 Other lack of coordination: Secondary | ICD-10-CM | POA: Diagnosis not present

## 2024-02-06 DIAGNOSIS — R2681 Unsteadiness on feet: Secondary | ICD-10-CM | POA: Diagnosis not present

## 2024-02-06 DIAGNOSIS — R1312 Dysphagia, oropharyngeal phase: Secondary | ICD-10-CM | POA: Diagnosis not present

## 2024-02-06 DIAGNOSIS — R41841 Cognitive communication deficit: Secondary | ICD-10-CM | POA: Diagnosis not present

## 2024-02-06 DIAGNOSIS — Z9181 History of falling: Secondary | ICD-10-CM | POA: Diagnosis not present

## 2024-02-07 DIAGNOSIS — Z9181 History of falling: Secondary | ICD-10-CM | POA: Diagnosis not present

## 2024-02-07 DIAGNOSIS — R1312 Dysphagia, oropharyngeal phase: Secondary | ICD-10-CM | POA: Diagnosis not present

## 2024-02-07 DIAGNOSIS — R278 Other lack of coordination: Secondary | ICD-10-CM | POA: Diagnosis not present

## 2024-02-07 DIAGNOSIS — R41841 Cognitive communication deficit: Secondary | ICD-10-CM | POA: Diagnosis not present

## 2024-02-07 DIAGNOSIS — M6281 Muscle weakness (generalized): Secondary | ICD-10-CM | POA: Diagnosis not present

## 2024-02-07 DIAGNOSIS — R2681 Unsteadiness on feet: Secondary | ICD-10-CM | POA: Diagnosis not present

## 2024-02-08 DIAGNOSIS — R2681 Unsteadiness on feet: Secondary | ICD-10-CM | POA: Diagnosis not present

## 2024-02-08 DIAGNOSIS — M6281 Muscle weakness (generalized): Secondary | ICD-10-CM | POA: Diagnosis not present

## 2024-02-08 DIAGNOSIS — R41841 Cognitive communication deficit: Secondary | ICD-10-CM | POA: Diagnosis not present

## 2024-02-08 DIAGNOSIS — R278 Other lack of coordination: Secondary | ICD-10-CM | POA: Diagnosis not present

## 2024-02-08 DIAGNOSIS — Z9181 History of falling: Secondary | ICD-10-CM | POA: Diagnosis not present

## 2024-02-08 DIAGNOSIS — R1312 Dysphagia, oropharyngeal phase: Secondary | ICD-10-CM | POA: Diagnosis not present

## 2024-02-13 DIAGNOSIS — R2681 Unsteadiness on feet: Secondary | ICD-10-CM | POA: Diagnosis not present

## 2024-02-13 DIAGNOSIS — R1312 Dysphagia, oropharyngeal phase: Secondary | ICD-10-CM | POA: Diagnosis not present

## 2024-02-13 DIAGNOSIS — R41841 Cognitive communication deficit: Secondary | ICD-10-CM | POA: Diagnosis not present

## 2024-02-13 DIAGNOSIS — Z9181 History of falling: Secondary | ICD-10-CM | POA: Diagnosis not present

## 2024-02-13 DIAGNOSIS — M6281 Muscle weakness (generalized): Secondary | ICD-10-CM | POA: Diagnosis not present

## 2024-02-13 DIAGNOSIS — R278 Other lack of coordination: Secondary | ICD-10-CM | POA: Diagnosis not present

## 2024-02-14 DIAGNOSIS — R278 Other lack of coordination: Secondary | ICD-10-CM | POA: Diagnosis not present

## 2024-02-14 DIAGNOSIS — Z9181 History of falling: Secondary | ICD-10-CM | POA: Diagnosis not present

## 2024-02-14 DIAGNOSIS — R41841 Cognitive communication deficit: Secondary | ICD-10-CM | POA: Diagnosis not present

## 2024-02-14 DIAGNOSIS — R1312 Dysphagia, oropharyngeal phase: Secondary | ICD-10-CM | POA: Diagnosis not present

## 2024-02-14 DIAGNOSIS — M6281 Muscle weakness (generalized): Secondary | ICD-10-CM | POA: Diagnosis not present

## 2024-02-14 DIAGNOSIS — R2681 Unsteadiness on feet: Secondary | ICD-10-CM | POA: Diagnosis not present

## 2024-02-15 DIAGNOSIS — R278 Other lack of coordination: Secondary | ICD-10-CM | POA: Diagnosis not present

## 2024-02-15 DIAGNOSIS — Z9181 History of falling: Secondary | ICD-10-CM | POA: Diagnosis not present

## 2024-02-15 DIAGNOSIS — R41841 Cognitive communication deficit: Secondary | ICD-10-CM | POA: Diagnosis not present

## 2024-02-15 DIAGNOSIS — R1312 Dysphagia, oropharyngeal phase: Secondary | ICD-10-CM | POA: Diagnosis not present

## 2024-02-15 DIAGNOSIS — R2681 Unsteadiness on feet: Secondary | ICD-10-CM | POA: Diagnosis not present

## 2024-02-15 DIAGNOSIS — M6281 Muscle weakness (generalized): Secondary | ICD-10-CM | POA: Diagnosis not present

## 2024-02-16 DIAGNOSIS — R41841 Cognitive communication deficit: Secondary | ICD-10-CM | POA: Diagnosis not present

## 2024-02-16 DIAGNOSIS — Z9181 History of falling: Secondary | ICD-10-CM | POA: Diagnosis not present

## 2024-02-16 DIAGNOSIS — R278 Other lack of coordination: Secondary | ICD-10-CM | POA: Diagnosis not present

## 2024-02-16 DIAGNOSIS — R1312 Dysphagia, oropharyngeal phase: Secondary | ICD-10-CM | POA: Diagnosis not present

## 2024-02-16 DIAGNOSIS — R2681 Unsteadiness on feet: Secondary | ICD-10-CM | POA: Diagnosis not present

## 2024-02-16 DIAGNOSIS — M6281 Muscle weakness (generalized): Secondary | ICD-10-CM | POA: Diagnosis not present

## 2024-02-20 DIAGNOSIS — Z9181 History of falling: Secondary | ICD-10-CM | POA: Diagnosis not present

## 2024-02-20 DIAGNOSIS — R2681 Unsteadiness on feet: Secondary | ICD-10-CM | POA: Diagnosis not present

## 2024-02-20 DIAGNOSIS — R1312 Dysphagia, oropharyngeal phase: Secondary | ICD-10-CM | POA: Diagnosis not present

## 2024-02-20 DIAGNOSIS — M6281 Muscle weakness (generalized): Secondary | ICD-10-CM | POA: Diagnosis not present

## 2024-02-20 DIAGNOSIS — R41841 Cognitive communication deficit: Secondary | ICD-10-CM | POA: Diagnosis not present

## 2024-02-20 DIAGNOSIS — R278 Other lack of coordination: Secondary | ICD-10-CM | POA: Diagnosis not present

## 2024-02-21 DIAGNOSIS — R2681 Unsteadiness on feet: Secondary | ICD-10-CM | POA: Diagnosis not present

## 2024-02-21 DIAGNOSIS — R1312 Dysphagia, oropharyngeal phase: Secondary | ICD-10-CM | POA: Diagnosis not present

## 2024-02-21 DIAGNOSIS — M6281 Muscle weakness (generalized): Secondary | ICD-10-CM | POA: Diagnosis not present

## 2024-02-21 DIAGNOSIS — R41841 Cognitive communication deficit: Secondary | ICD-10-CM | POA: Diagnosis not present

## 2024-02-21 DIAGNOSIS — R278 Other lack of coordination: Secondary | ICD-10-CM | POA: Diagnosis not present

## 2024-02-21 DIAGNOSIS — Z9181 History of falling: Secondary | ICD-10-CM | POA: Diagnosis not present

## 2024-02-22 DIAGNOSIS — M6281 Muscle weakness (generalized): Secondary | ICD-10-CM | POA: Diagnosis not present

## 2024-02-22 DIAGNOSIS — Z9181 History of falling: Secondary | ICD-10-CM | POA: Diagnosis not present

## 2024-02-22 DIAGNOSIS — R278 Other lack of coordination: Secondary | ICD-10-CM | POA: Diagnosis not present

## 2024-02-22 DIAGNOSIS — R2681 Unsteadiness on feet: Secondary | ICD-10-CM | POA: Diagnosis not present

## 2024-02-22 DIAGNOSIS — R41841 Cognitive communication deficit: Secondary | ICD-10-CM | POA: Diagnosis not present

## 2024-02-22 DIAGNOSIS — R1312 Dysphagia, oropharyngeal phase: Secondary | ICD-10-CM | POA: Diagnosis not present

## 2024-02-25 ENCOUNTER — Other Ambulatory Visit: Payer: Self-pay | Admitting: Cardiovascular Disease

## 2024-02-27 DIAGNOSIS — Z9181 History of falling: Secondary | ICD-10-CM | POA: Diagnosis not present

## 2024-02-27 DIAGNOSIS — R41841 Cognitive communication deficit: Secondary | ICD-10-CM | POA: Diagnosis not present

## 2024-02-27 DIAGNOSIS — R1312 Dysphagia, oropharyngeal phase: Secondary | ICD-10-CM | POA: Diagnosis not present

## 2024-02-27 DIAGNOSIS — R278 Other lack of coordination: Secondary | ICD-10-CM | POA: Diagnosis not present

## 2024-02-27 DIAGNOSIS — M6281 Muscle weakness (generalized): Secondary | ICD-10-CM | POA: Diagnosis not present

## 2024-02-27 DIAGNOSIS — R2681 Unsteadiness on feet: Secondary | ICD-10-CM | POA: Diagnosis not present

## 2024-02-28 DIAGNOSIS — R41841 Cognitive communication deficit: Secondary | ICD-10-CM | POA: Diagnosis not present

## 2024-02-28 DIAGNOSIS — M6281 Muscle weakness (generalized): Secondary | ICD-10-CM | POA: Diagnosis not present

## 2024-02-28 DIAGNOSIS — R1312 Dysphagia, oropharyngeal phase: Secondary | ICD-10-CM | POA: Diagnosis not present

## 2024-02-28 DIAGNOSIS — R2681 Unsteadiness on feet: Secondary | ICD-10-CM | POA: Diagnosis not present

## 2024-02-28 DIAGNOSIS — Z9181 History of falling: Secondary | ICD-10-CM | POA: Diagnosis not present

## 2024-02-28 DIAGNOSIS — R278 Other lack of coordination: Secondary | ICD-10-CM | POA: Diagnosis not present

## 2024-02-29 DIAGNOSIS — Z9181 History of falling: Secondary | ICD-10-CM | POA: Diagnosis not present

## 2024-02-29 DIAGNOSIS — R278 Other lack of coordination: Secondary | ICD-10-CM | POA: Diagnosis not present

## 2024-02-29 DIAGNOSIS — R41841 Cognitive communication deficit: Secondary | ICD-10-CM | POA: Diagnosis not present

## 2024-02-29 DIAGNOSIS — R2681 Unsteadiness on feet: Secondary | ICD-10-CM | POA: Diagnosis not present

## 2024-02-29 DIAGNOSIS — M6281 Muscle weakness (generalized): Secondary | ICD-10-CM | POA: Diagnosis not present

## 2024-02-29 DIAGNOSIS — R1312 Dysphagia, oropharyngeal phase: Secondary | ICD-10-CM | POA: Diagnosis not present

## 2024-03-05 DIAGNOSIS — R2681 Unsteadiness on feet: Secondary | ICD-10-CM | POA: Diagnosis not present

## 2024-03-05 DIAGNOSIS — Z9181 History of falling: Secondary | ICD-10-CM | POA: Diagnosis not present

## 2024-03-05 DIAGNOSIS — R1312 Dysphagia, oropharyngeal phase: Secondary | ICD-10-CM | POA: Diagnosis not present

## 2024-03-05 DIAGNOSIS — R41841 Cognitive communication deficit: Secondary | ICD-10-CM | POA: Diagnosis not present

## 2024-03-05 DIAGNOSIS — M6281 Muscle weakness (generalized): Secondary | ICD-10-CM | POA: Diagnosis not present

## 2024-03-05 DIAGNOSIS — R278 Other lack of coordination: Secondary | ICD-10-CM | POA: Diagnosis not present

## 2024-03-06 ENCOUNTER — Other Ambulatory Visit: Payer: Self-pay | Admitting: Internal Medicine

## 2024-03-06 DIAGNOSIS — Z9181 History of falling: Secondary | ICD-10-CM | POA: Diagnosis not present

## 2024-03-06 DIAGNOSIS — R278 Other lack of coordination: Secondary | ICD-10-CM | POA: Diagnosis not present

## 2024-03-06 DIAGNOSIS — R1312 Dysphagia, oropharyngeal phase: Secondary | ICD-10-CM | POA: Diagnosis not present

## 2024-03-06 DIAGNOSIS — R2681 Unsteadiness on feet: Secondary | ICD-10-CM | POA: Diagnosis not present

## 2024-03-06 DIAGNOSIS — M6281 Muscle weakness (generalized): Secondary | ICD-10-CM | POA: Diagnosis not present

## 2024-03-06 DIAGNOSIS — R41841 Cognitive communication deficit: Secondary | ICD-10-CM | POA: Diagnosis not present

## 2024-03-07 DIAGNOSIS — R2681 Unsteadiness on feet: Secondary | ICD-10-CM | POA: Diagnosis not present

## 2024-03-07 DIAGNOSIS — Z9181 History of falling: Secondary | ICD-10-CM | POA: Diagnosis not present

## 2024-03-07 DIAGNOSIS — H7292 Unspecified perforation of tympanic membrane, left ear: Secondary | ICD-10-CM | POA: Diagnosis not present

## 2024-03-07 DIAGNOSIS — H6123 Impacted cerumen, bilateral: Secondary | ICD-10-CM | POA: Diagnosis not present

## 2024-03-07 DIAGNOSIS — M6281 Muscle weakness (generalized): Secondary | ICD-10-CM | POA: Diagnosis not present

## 2024-03-07 DIAGNOSIS — R41841 Cognitive communication deficit: Secondary | ICD-10-CM | POA: Diagnosis not present

## 2024-03-07 DIAGNOSIS — R278 Other lack of coordination: Secondary | ICD-10-CM | POA: Diagnosis not present

## 2024-03-07 DIAGNOSIS — R1312 Dysphagia, oropharyngeal phase: Secondary | ICD-10-CM | POA: Diagnosis not present

## 2024-03-08 DIAGNOSIS — R278 Other lack of coordination: Secondary | ICD-10-CM | POA: Diagnosis not present

## 2024-03-08 DIAGNOSIS — Z9181 History of falling: Secondary | ICD-10-CM | POA: Diagnosis not present

## 2024-03-08 DIAGNOSIS — M6281 Muscle weakness (generalized): Secondary | ICD-10-CM | POA: Diagnosis not present

## 2024-03-08 DIAGNOSIS — R2681 Unsteadiness on feet: Secondary | ICD-10-CM | POA: Diagnosis not present

## 2024-03-08 DIAGNOSIS — R41841 Cognitive communication deficit: Secondary | ICD-10-CM | POA: Diagnosis not present

## 2024-03-08 DIAGNOSIS — R1312 Dysphagia, oropharyngeal phase: Secondary | ICD-10-CM | POA: Diagnosis not present

## 2024-03-13 DIAGNOSIS — Z9181 History of falling: Secondary | ICD-10-CM | POA: Diagnosis not present

## 2024-03-13 DIAGNOSIS — R278 Other lack of coordination: Secondary | ICD-10-CM | POA: Diagnosis not present

## 2024-03-13 DIAGNOSIS — M6281 Muscle weakness (generalized): Secondary | ICD-10-CM | POA: Diagnosis not present

## 2024-03-13 DIAGNOSIS — R1312 Dysphagia, oropharyngeal phase: Secondary | ICD-10-CM | POA: Diagnosis not present

## 2024-03-13 DIAGNOSIS — R41841 Cognitive communication deficit: Secondary | ICD-10-CM | POA: Diagnosis not present

## 2024-03-13 DIAGNOSIS — R2681 Unsteadiness on feet: Secondary | ICD-10-CM | POA: Diagnosis not present

## 2024-03-14 DIAGNOSIS — R278 Other lack of coordination: Secondary | ICD-10-CM | POA: Diagnosis not present

## 2024-03-14 DIAGNOSIS — M6281 Muscle weakness (generalized): Secondary | ICD-10-CM | POA: Diagnosis not present

## 2024-03-14 DIAGNOSIS — R1312 Dysphagia, oropharyngeal phase: Secondary | ICD-10-CM | POA: Diagnosis not present

## 2024-03-14 DIAGNOSIS — R41841 Cognitive communication deficit: Secondary | ICD-10-CM | POA: Diagnosis not present

## 2024-03-14 DIAGNOSIS — Z9181 History of falling: Secondary | ICD-10-CM | POA: Diagnosis not present

## 2024-03-14 DIAGNOSIS — R2681 Unsteadiness on feet: Secondary | ICD-10-CM | POA: Diagnosis not present

## 2024-03-15 DIAGNOSIS — R278 Other lack of coordination: Secondary | ICD-10-CM | POA: Diagnosis not present

## 2024-03-15 DIAGNOSIS — M6281 Muscle weakness (generalized): Secondary | ICD-10-CM | POA: Diagnosis not present

## 2024-03-15 DIAGNOSIS — Z9181 History of falling: Secondary | ICD-10-CM | POA: Diagnosis not present

## 2024-03-15 DIAGNOSIS — R41841 Cognitive communication deficit: Secondary | ICD-10-CM | POA: Diagnosis not present

## 2024-03-15 DIAGNOSIS — R1312 Dysphagia, oropharyngeal phase: Secondary | ICD-10-CM | POA: Diagnosis not present

## 2024-03-15 DIAGNOSIS — R2681 Unsteadiness on feet: Secondary | ICD-10-CM | POA: Diagnosis not present

## 2024-03-16 ENCOUNTER — Telehealth: Payer: Self-pay

## 2024-03-16 NOTE — Telephone Encounter (Signed)
 Message routed to PCP Charlanne Fredia CROME, MD please advise.

## 2024-03-16 NOTE — Telephone Encounter (Signed)
 Copied from CRM [id number]. Topic: Clinical - Medication Question >> Mar 16, 2024 11:31 AM Susanna ORN wrote: Reason for CRM: Montie Awkward, called in stating that she helps Mrs. Millward with her medications. She states she went on MyChart to try to get a refill for KLOR-CON  M20 20 MEQ tablet. She stated that she noticed that it says for patient to take 1 tablet a day but patient has been taking it twice a day because that's what the previous prescriptions stated. Montie states she's not sure if this is an error or was the directions changed. States patient's husband usually remembers the instructions from the provider but said he doesn't remember Dr. Charlanne changing it. Please give Cynthia a call back to advise. CB #: [phone].

## 2024-03-19 DIAGNOSIS — M6281 Muscle weakness (generalized): Secondary | ICD-10-CM | POA: Diagnosis not present

## 2024-03-19 DIAGNOSIS — R1312 Dysphagia, oropharyngeal phase: Secondary | ICD-10-CM | POA: Diagnosis not present

## 2024-03-19 DIAGNOSIS — R41841 Cognitive communication deficit: Secondary | ICD-10-CM | POA: Diagnosis not present

## 2024-03-19 DIAGNOSIS — R278 Other lack of coordination: Secondary | ICD-10-CM | POA: Diagnosis not present

## 2024-03-19 DIAGNOSIS — R2681 Unsteadiness on feet: Secondary | ICD-10-CM | POA: Diagnosis not present

## 2024-03-19 DIAGNOSIS — Z9181 History of falling: Secondary | ICD-10-CM | POA: Diagnosis not present

## 2024-03-19 NOTE — Telephone Encounter (Signed)
 Cardiology recommended KCL twice daily. Recommend following cardiology directions. Appears refills were given by PCP and cardiology within a week of each other> one refill 06/30 and another 07/07. Apologies for confusion.

## 2024-03-20 ENCOUNTER — Telehealth: Payer: Self-pay

## 2024-03-20 ENCOUNTER — Other Ambulatory Visit: Payer: Self-pay | Admitting: Orthopedic Surgery

## 2024-03-20 DIAGNOSIS — R1312 Dysphagia, oropharyngeal phase: Secondary | ICD-10-CM | POA: Diagnosis not present

## 2024-03-20 DIAGNOSIS — R2681 Unsteadiness on feet: Secondary | ICD-10-CM | POA: Diagnosis not present

## 2024-03-20 DIAGNOSIS — R41841 Cognitive communication deficit: Secondary | ICD-10-CM | POA: Diagnosis not present

## 2024-03-20 DIAGNOSIS — R278 Other lack of coordination: Secondary | ICD-10-CM | POA: Diagnosis not present

## 2024-03-20 DIAGNOSIS — Z9181 History of falling: Secondary | ICD-10-CM | POA: Diagnosis not present

## 2024-03-20 DIAGNOSIS — M6281 Muscle weakness (generalized): Secondary | ICD-10-CM | POA: Diagnosis not present

## 2024-03-20 MED ORDER — POTASSIUM CHLORIDE CRYS ER 20 MEQ PO TBCR: 20.0000 meq | EXTENDED_RELEASE_TABLET | Freq: Two times a day (BID) | ORAL | 1 refills | Status: AC

## 2024-03-20 NOTE — Telephone Encounter (Signed)
 Spoke with patient's spouse and he will relate the message to her when she comes back from PT. Patient's spouse has no other question or concerns at this time.  Message sent to Greig Cluster, NP

## 2024-03-20 NOTE — Telephone Encounter (Signed)
 Copied from CRM [id number]. Topic: Clinical - Medication Question >> Mar 16, 2024 11:31 AM Susanna ORN wrote: Reason for CRM: Montie Awkward, called in stating that she helps Mrs. Millward with her medications. She states she went on MyChart to try to get a refill for KLOR-CON  M20 20 MEQ tablet. She stated that she noticed that it says for patient to take 1 tablet a day but patient has been taking it twice a day because that's what the previous prescriptions stated. Montie states she's not sure if this is an error or was the directions changed. States patient's husband usually remembers the instructions from the provider but said he doesn't remember Dr. Charlanne changing it. Please give Cynthia a call back to advise. CB #: [phone].

## 2024-03-20 NOTE — Telephone Encounter (Signed)
 New prescription sent to pharmacy

## 2024-03-20 NOTE — Telephone Encounter (Signed)
 [name], called in stating that current prescription says once a day therefore they need a new prescription sent to the pharmacy for twice a day because they are running out of medication. She is requesting for it to be sent today 03/20/24 if possible and to call her back for any questions.Callback: [phone]

## 2024-03-20 NOTE — Telephone Encounter (Signed)
 Copied from CRM [id number]. Topic: Clinical - Medication Question >> Mar 20, 2024  2:20 PM Carrielelia G wrote: Dorthea Rector returning Stephanie's [facility]?)  call.  She said she received your msg but if you have something else to discuss with her please call.

## 2024-03-20 NOTE — Telephone Encounter (Signed)
 Left a detail voicemail to Montie Awkward in regards the patient medication has been sent to the pharmacy.

## 2024-03-20 NOTE — Telephone Encounter (Signed)
 Message routed to clinical intake for today Bethany.B/CMA to follow up.

## 2024-03-21 DIAGNOSIS — R1312 Dysphagia, oropharyngeal phase: Secondary | ICD-10-CM | POA: Diagnosis not present

## 2024-03-21 DIAGNOSIS — R2681 Unsteadiness on feet: Secondary | ICD-10-CM | POA: Diagnosis not present

## 2024-03-21 DIAGNOSIS — M6281 Muscle weakness (generalized): Secondary | ICD-10-CM | POA: Diagnosis not present

## 2024-03-21 DIAGNOSIS — R41841 Cognitive communication deficit: Secondary | ICD-10-CM | POA: Diagnosis not present

## 2024-03-21 DIAGNOSIS — R278 Other lack of coordination: Secondary | ICD-10-CM | POA: Diagnosis not present

## 2024-03-21 DIAGNOSIS — Z9181 History of falling: Secondary | ICD-10-CM | POA: Diagnosis not present

## 2024-03-22 DIAGNOSIS — R41841 Cognitive communication deficit: Secondary | ICD-10-CM | POA: Diagnosis not present

## 2024-03-22 DIAGNOSIS — M6281 Muscle weakness (generalized): Secondary | ICD-10-CM | POA: Diagnosis not present

## 2024-03-22 DIAGNOSIS — R278 Other lack of coordination: Secondary | ICD-10-CM | POA: Diagnosis not present

## 2024-03-22 DIAGNOSIS — R2681 Unsteadiness on feet: Secondary | ICD-10-CM | POA: Diagnosis not present

## 2024-03-22 DIAGNOSIS — Z9181 History of falling: Secondary | ICD-10-CM | POA: Diagnosis not present

## 2024-03-22 DIAGNOSIS — R1312 Dysphagia, oropharyngeal phase: Secondary | ICD-10-CM | POA: Diagnosis not present

## 2024-03-26 DIAGNOSIS — R278 Other lack of coordination: Secondary | ICD-10-CM | POA: Diagnosis not present

## 2024-03-26 DIAGNOSIS — Z9181 History of falling: Secondary | ICD-10-CM | POA: Diagnosis not present

## 2024-03-26 DIAGNOSIS — R41841 Cognitive communication deficit: Secondary | ICD-10-CM | POA: Diagnosis not present

## 2024-03-26 DIAGNOSIS — R2681 Unsteadiness on feet: Secondary | ICD-10-CM | POA: Diagnosis not present

## 2024-03-26 DIAGNOSIS — M6281 Muscle weakness (generalized): Secondary | ICD-10-CM | POA: Diagnosis not present

## 2024-03-26 DIAGNOSIS — R1312 Dysphagia, oropharyngeal phase: Secondary | ICD-10-CM | POA: Diagnosis not present

## 2024-03-27 DIAGNOSIS — Z9181 History of falling: Secondary | ICD-10-CM | POA: Diagnosis not present

## 2024-03-27 DIAGNOSIS — R2681 Unsteadiness on feet: Secondary | ICD-10-CM | POA: Diagnosis not present

## 2024-03-27 DIAGNOSIS — R41841 Cognitive communication deficit: Secondary | ICD-10-CM | POA: Diagnosis not present

## 2024-03-27 DIAGNOSIS — R1312 Dysphagia, oropharyngeal phase: Secondary | ICD-10-CM | POA: Diagnosis not present

## 2024-03-27 DIAGNOSIS — R278 Other lack of coordination: Secondary | ICD-10-CM | POA: Diagnosis not present

## 2024-03-27 DIAGNOSIS — M6281 Muscle weakness (generalized): Secondary | ICD-10-CM | POA: Diagnosis not present

## 2024-03-28 DIAGNOSIS — R2681 Unsteadiness on feet: Secondary | ICD-10-CM | POA: Diagnosis not present

## 2024-03-28 DIAGNOSIS — R1312 Dysphagia, oropharyngeal phase: Secondary | ICD-10-CM | POA: Diagnosis not present

## 2024-03-28 DIAGNOSIS — R41841 Cognitive communication deficit: Secondary | ICD-10-CM | POA: Diagnosis not present

## 2024-03-28 DIAGNOSIS — M6281 Muscle weakness (generalized): Secondary | ICD-10-CM | POA: Diagnosis not present

## 2024-03-28 DIAGNOSIS — Z9181 History of falling: Secondary | ICD-10-CM | POA: Diagnosis not present

## 2024-03-28 DIAGNOSIS — R278 Other lack of coordination: Secondary | ICD-10-CM | POA: Diagnosis not present

## 2024-03-29 DIAGNOSIS — R278 Other lack of coordination: Secondary | ICD-10-CM | POA: Diagnosis not present

## 2024-03-29 DIAGNOSIS — R41841 Cognitive communication deficit: Secondary | ICD-10-CM | POA: Diagnosis not present

## 2024-03-29 DIAGNOSIS — Z9181 History of falling: Secondary | ICD-10-CM | POA: Diagnosis not present

## 2024-03-29 DIAGNOSIS — M6281 Muscle weakness (generalized): Secondary | ICD-10-CM | POA: Diagnosis not present

## 2024-03-29 DIAGNOSIS — R1312 Dysphagia, oropharyngeal phase: Secondary | ICD-10-CM | POA: Diagnosis not present

## 2024-03-29 DIAGNOSIS — R2681 Unsteadiness on feet: Secondary | ICD-10-CM | POA: Diagnosis not present

## 2024-04-03 DIAGNOSIS — R278 Other lack of coordination: Secondary | ICD-10-CM | POA: Diagnosis not present

## 2024-04-03 DIAGNOSIS — R1312 Dysphagia, oropharyngeal phase: Secondary | ICD-10-CM | POA: Diagnosis not present

## 2024-04-03 DIAGNOSIS — M6281 Muscle weakness (generalized): Secondary | ICD-10-CM | POA: Diagnosis not present

## 2024-04-03 DIAGNOSIS — R2681 Unsteadiness on feet: Secondary | ICD-10-CM | POA: Diagnosis not present

## 2024-04-03 DIAGNOSIS — R41841 Cognitive communication deficit: Secondary | ICD-10-CM | POA: Diagnosis not present

## 2024-04-03 DIAGNOSIS — Z9181 History of falling: Secondary | ICD-10-CM | POA: Diagnosis not present

## 2024-04-04 DIAGNOSIS — R41841 Cognitive communication deficit: Secondary | ICD-10-CM | POA: Diagnosis not present

## 2024-04-04 DIAGNOSIS — Z9181 History of falling: Secondary | ICD-10-CM | POA: Diagnosis not present

## 2024-04-04 DIAGNOSIS — R2681 Unsteadiness on feet: Secondary | ICD-10-CM | POA: Diagnosis not present

## 2024-04-04 DIAGNOSIS — R1312 Dysphagia, oropharyngeal phase: Secondary | ICD-10-CM | POA: Diagnosis not present

## 2024-04-04 DIAGNOSIS — R278 Other lack of coordination: Secondary | ICD-10-CM | POA: Diagnosis not present

## 2024-04-04 DIAGNOSIS — M6281 Muscle weakness (generalized): Secondary | ICD-10-CM | POA: Diagnosis not present

## 2024-04-05 DIAGNOSIS — R1312 Dysphagia, oropharyngeal phase: Secondary | ICD-10-CM | POA: Diagnosis not present

## 2024-04-05 DIAGNOSIS — R2681 Unsteadiness on feet: Secondary | ICD-10-CM | POA: Diagnosis not present

## 2024-04-05 DIAGNOSIS — R41841 Cognitive communication deficit: Secondary | ICD-10-CM | POA: Diagnosis not present

## 2024-04-05 DIAGNOSIS — R278 Other lack of coordination: Secondary | ICD-10-CM | POA: Diagnosis not present

## 2024-04-05 DIAGNOSIS — M6281 Muscle weakness (generalized): Secondary | ICD-10-CM | POA: Diagnosis not present

## 2024-04-05 DIAGNOSIS — Z9181 History of falling: Secondary | ICD-10-CM | POA: Diagnosis not present

## 2024-04-10 DIAGNOSIS — M6281 Muscle weakness (generalized): Secondary | ICD-10-CM | POA: Diagnosis not present

## 2024-04-10 DIAGNOSIS — R41841 Cognitive communication deficit: Secondary | ICD-10-CM | POA: Diagnosis not present

## 2024-04-10 DIAGNOSIS — R278 Other lack of coordination: Secondary | ICD-10-CM | POA: Diagnosis not present

## 2024-04-10 DIAGNOSIS — Z9181 History of falling: Secondary | ICD-10-CM | POA: Diagnosis not present

## 2024-04-10 DIAGNOSIS — R1312 Dysphagia, oropharyngeal phase: Secondary | ICD-10-CM | POA: Diagnosis not present

## 2024-04-10 DIAGNOSIS — R2681 Unsteadiness on feet: Secondary | ICD-10-CM | POA: Diagnosis not present

## 2024-04-11 DIAGNOSIS — R41841 Cognitive communication deficit: Secondary | ICD-10-CM | POA: Diagnosis not present

## 2024-04-11 DIAGNOSIS — Z9181 History of falling: Secondary | ICD-10-CM | POA: Diagnosis not present

## 2024-04-11 DIAGNOSIS — M6281 Muscle weakness (generalized): Secondary | ICD-10-CM | POA: Diagnosis not present

## 2024-04-11 DIAGNOSIS — R1312 Dysphagia, oropharyngeal phase: Secondary | ICD-10-CM | POA: Diagnosis not present

## 2024-04-11 DIAGNOSIS — R2681 Unsteadiness on feet: Secondary | ICD-10-CM | POA: Diagnosis not present

## 2024-04-11 DIAGNOSIS — R278 Other lack of coordination: Secondary | ICD-10-CM | POA: Diagnosis not present

## 2024-04-17 DIAGNOSIS — M6281 Muscle weakness (generalized): Secondary | ICD-10-CM | POA: Diagnosis not present

## 2024-04-17 DIAGNOSIS — R1312 Dysphagia, oropharyngeal phase: Secondary | ICD-10-CM | POA: Diagnosis not present

## 2024-04-17 DIAGNOSIS — R278 Other lack of coordination: Secondary | ICD-10-CM | POA: Diagnosis not present

## 2024-04-17 DIAGNOSIS — Z9181 History of falling: Secondary | ICD-10-CM | POA: Diagnosis not present

## 2024-04-17 DIAGNOSIS — R2681 Unsteadiness on feet: Secondary | ICD-10-CM | POA: Diagnosis not present

## 2024-04-17 DIAGNOSIS — R41841 Cognitive communication deficit: Secondary | ICD-10-CM | POA: Diagnosis not present

## 2024-04-18 DIAGNOSIS — Z9181 History of falling: Secondary | ICD-10-CM | POA: Diagnosis not present

## 2024-04-18 DIAGNOSIS — R2681 Unsteadiness on feet: Secondary | ICD-10-CM | POA: Diagnosis not present

## 2024-04-18 DIAGNOSIS — M6281 Muscle weakness (generalized): Secondary | ICD-10-CM | POA: Diagnosis not present

## 2024-04-18 DIAGNOSIS — R278 Other lack of coordination: Secondary | ICD-10-CM | POA: Diagnosis not present

## 2024-04-20 DIAGNOSIS — H353123 Nonexudative age-related macular degeneration, left eye, advanced atrophic without subfoveal involvement: Secondary | ICD-10-CM | POA: Diagnosis not present

## 2024-04-20 DIAGNOSIS — H43813 Vitreous degeneration, bilateral: Secondary | ICD-10-CM | POA: Diagnosis not present

## 2024-04-20 DIAGNOSIS — H353212 Exudative age-related macular degeneration, right eye, with inactive choroidal neovascularization: Secondary | ICD-10-CM | POA: Diagnosis not present

## 2024-04-20 DIAGNOSIS — H35373 Puckering of macula, bilateral: Secondary | ICD-10-CM | POA: Diagnosis not present

## 2024-04-24 DIAGNOSIS — R278 Other lack of coordination: Secondary | ICD-10-CM | POA: Diagnosis not present

## 2024-04-24 DIAGNOSIS — R2681 Unsteadiness on feet: Secondary | ICD-10-CM | POA: Diagnosis not present

## 2024-04-24 DIAGNOSIS — M6281 Muscle weakness (generalized): Secondary | ICD-10-CM | POA: Diagnosis not present

## 2024-04-24 DIAGNOSIS — Z9181 History of falling: Secondary | ICD-10-CM | POA: Diagnosis not present

## 2024-04-25 DIAGNOSIS — R2681 Unsteadiness on feet: Secondary | ICD-10-CM | POA: Diagnosis not present

## 2024-04-25 DIAGNOSIS — R278 Other lack of coordination: Secondary | ICD-10-CM | POA: Diagnosis not present

## 2024-04-25 DIAGNOSIS — Z9181 History of falling: Secondary | ICD-10-CM | POA: Diagnosis not present

## 2024-04-25 DIAGNOSIS — M6281 Muscle weakness (generalized): Secondary | ICD-10-CM | POA: Diagnosis not present

## 2024-05-01 DIAGNOSIS — R2681 Unsteadiness on feet: Secondary | ICD-10-CM | POA: Diagnosis not present

## 2024-05-01 DIAGNOSIS — M6281 Muscle weakness (generalized): Secondary | ICD-10-CM | POA: Diagnosis not present

## 2024-05-01 DIAGNOSIS — Z9181 History of falling: Secondary | ICD-10-CM | POA: Diagnosis not present

## 2024-05-01 DIAGNOSIS — R278 Other lack of coordination: Secondary | ICD-10-CM | POA: Diagnosis not present

## 2024-05-02 ENCOUNTER — Non-Acute Institutional Stay: Payer: MEDICARE | Admitting: Internal Medicine

## 2024-05-02 ENCOUNTER — Encounter: Payer: Self-pay | Admitting: Internal Medicine

## 2024-05-02 VITALS — BP 128/72 | HR 52 | Temp 98.1°F | Resp 18 | Ht 65.0 in | Wt 160.2 lb

## 2024-05-02 DIAGNOSIS — I480 Paroxysmal atrial fibrillation: Secondary | ICD-10-CM

## 2024-05-02 DIAGNOSIS — E7800 Pure hypercholesterolemia, unspecified: Secondary | ICD-10-CM | POA: Diagnosis not present

## 2024-05-02 DIAGNOSIS — D631 Anemia in chronic kidney disease: Secondary | ICD-10-CM

## 2024-05-02 DIAGNOSIS — Z9181 History of falling: Secondary | ICD-10-CM | POA: Diagnosis not present

## 2024-05-02 DIAGNOSIS — R278 Other lack of coordination: Secondary | ICD-10-CM | POA: Diagnosis not present

## 2024-05-02 DIAGNOSIS — I5032 Chronic diastolic (congestive) heart failure: Secondary | ICD-10-CM

## 2024-05-02 DIAGNOSIS — K219 Gastro-esophageal reflux disease without esophagitis: Secondary | ICD-10-CM | POA: Diagnosis not present

## 2024-05-02 DIAGNOSIS — M6281 Muscle weakness (generalized): Secondary | ICD-10-CM | POA: Diagnosis not present

## 2024-05-02 DIAGNOSIS — R2681 Unsteadiness on feet: Secondary | ICD-10-CM | POA: Diagnosis not present

## 2024-05-02 DIAGNOSIS — Z853 Personal history of malignant neoplasm of breast: Secondary | ICD-10-CM | POA: Diagnosis not present

## 2024-05-02 DIAGNOSIS — N1832 Chronic kidney disease, stage 3b: Secondary | ICD-10-CM

## 2024-05-02 DIAGNOSIS — G3184 Mild cognitive impairment, so stated: Secondary | ICD-10-CM | POA: Diagnosis not present

## 2024-05-02 DIAGNOSIS — H9190 Unspecified hearing loss, unspecified ear: Secondary | ICD-10-CM | POA: Diagnosis not present

## 2024-05-02 NOTE — Patient Instructions (Addendum)
 1)Get your Covid and Flu shot from Pharmacy 2) Labs will be done on 05/10/2024 for Blood work at [facility]

## 2024-05-02 NOTE — Progress Notes (Signed)
 [facility]  [facility]:  Clinic ([id number])  Provider:   Code Status: DNR Goals of Care:     11/02/2023   10:13 AM  Advanced Directives  Does Patient Have a Medical Advance Directive? Yes  Type of Estate agent of [place];Living will;Out of facility DNR (pink MOST or yellow form)  Does patient want to make changes to medical advance directive? No - Patient declined  Copy of Healthcare Power of Attorney in Chart? Yes - validated most recent copy scanned in chart (See row information)     Chief Complaint  Patient presents with   Medical Management of Chronic Issues    6 month follow-up, patient is aware that she is due for medicare annual wellness visit, shingles, flu, and covid vaccine. Caregiver is in the room with the patient     HPI: Patient is a 89 y.o. female seen today for medical management of chronic diseases.    Lives in [place] in [facility] with her husband    Patient has a history of A-fib on Eliquis , history of bilateral breast cancer s/p lumpectomy and restarted back on tamoxifen   CKD GERD Worsening Cognition  Patient came with her Caregiver. No Complains Very HOH Short term memory is not good Repeats herself Very Pleasant  Discussed the use of AI scribe software for clinical note transcription with the patient, who gave verbal consent to proceed.  History of Present Illness   [name] is a 89 year old female who presents for a routine geriatric evaluation.  She manages daily activities with some assistance, leaving her home at 1 PM and receiving help from friends at 8 PM. She is alone at night. She uses a walker consistently and has not experienced any falls. She requires help with dressing but can shower with assistance.  She experiences occasional leg swelling without significant pain in her legs, arms, or shoulders. Her knees cause difficulty in standing quickly, but she does not experience dizziness upon  standing.  She uses hearing aids but has difficulty hearing, especially on the left side.  She has not received her flu or COVID-19 vaccinations this year and needs a shingles vaccination.      Past Medical History:  Diagnosis Date   Allergic rhinitis, seasonal    Breast cancer of upper-outer quadrant of right female breast (HCC) 09/13/2014   Chronic venous insufficiency    Dermatophytosis of nail    Dysrhythmia    H/o Atrial fibrillation   Edema leg    Family history of malignant neoplasm of breast    Family history of malignant neoplasm of ovary    Goiter    right thyroid  nodule    HTN (hypertension) 08/13/2017   HX: breast cancer    bilateral   Menopausal syndrome    Osteoarthritis    Osteoporosis    Squamous cell skin cancer    right lower leg   Wears glasses    Wears hearing aid    both ears    Past Surgical History:  Procedure Laterality Date   bilateral lumpectomies for breast cancer  Aug. 2007   BREAST LUMPECTOMY WITH RADIOACTIVE SEED LOCALIZATION Right 10/01/2014   Procedure: BREAST LUMPECTOMY WITH RADIOACTIVE SEED LOCALIZATION;  Surgeon: Debby Shipper, MD;  Location: [facility];  Service: General;  Laterality: Right;   BREAST LUMPECTOMY WITH RADIOACTIVE SEED LOCALIZATION Left 03/22/2019   Procedure: LEFT BREAST LUMPECTOMY WITH RADIOACTIVE SEED LOCALIZATION;  Surgeon: Shipper Debby, MD;  [facility];  Service: General;  Laterality: Left;   BREAST LUMPECTOMY WITH RADIOACTIVE SEED LOCALIZATION Right 08/18/2021   Procedure: RIGHT BREAST LUMPECTOMY WITH RADIOACTIVE SEED LOCALIZATION;  Surgeon: Vanderbilt Ned, MD;  [facility]: [facility];  Service: General;  Laterality: Right;   CATARACT EXTRACTION     CHOLECYSTECTOMY N/A 08/13/2017   Procedure: LAPAROSCOPIC CHOLECYSTECTOMY WITH INTRAOPERATIVE CHOLANGIOGRAM;  Surgeon: Mikell Katz, MD;  Location: [facility];  Service: General;  Laterality: N/A;   DILATION AND CURETTAGE OF UTERUS      ESOPHAGOGASTRODUODENOSCOPY  07/26/2011   Procedure: ESOPHAGOGASTRODUODENOSCOPY (EGD);  Surgeon: Oliva FORBES Boots, MD;  Location: [facility];  Service: Endoscopy;  Laterality: N/A;   history of lumbar compression fracture     left eardum sx  1983   left knee arthroscopic surgery     no screening colonoscopy     s/p bilateral lumpectomies     SAVORY DILATION  07/26/2011   Procedure: SAVORY DILATION;  Surgeon: Oliva FORBES Boots, MD;  Location: [facility];  Service: Endoscopy;  Laterality: N/A;   status post resection squamous cell cancer right lower leg     TONSILLECTOMY     UMBILICAL HERNIA REPAIR      Allergies  Allergen Reactions   Celecoxib Swelling    Swelling-leg    Outpatient Encounter Medications as of 05/02/2024  Medication Sig   atorvastatin  (LIPITOR ) 40 MG tablet TAKE 1 TABLET BY MOUTH EVERY DAY   ELIQUIS  5 MG TABS tablet TAKE 1 TABLET BY MOUTH TWICE A DAY   famotidine (PEPCID) 20 MG tablet Take 20 mg by mouth daily.   furosemide  (LASIX ) 40 MG tablet TAKE 1.5 TABLETS BY MOUTH DAILY.   ipratropium (ATROVENT ) 0.06 % nasal spray 2 sprays each nostril every 6 (SIX) hours to dry up nose.   Magnesium  Oxide 250 MG TABS Take 1 tablet (250 mg total) by mouth daily.   metoprolol  succinate (TOPROL -XL) 25 MG 24 hr tablet TAKE 1 TABLET BY MOUTH EVERYDAY AT BEDTIME   metoprolol  tartrate (LOPRESSOR ) 25 MG tablet TAKE 1 TABLET BY MOUTH EVERY 8 HOURS AS NEEDED.   montelukast  (SINGULAIR ) 10 MG tablet TAKE 1 TABLET BY MOUTH AT NIGHT FOR COUGH OR WHEEZING   Multiple Vitamins-Minerals (PRESERVISION AREDS 2) CAPS Take 1 capsule by mouth 2 (two) times daily.    potassium chloride  SA (KLOR-CON  M20) 20 MEQ tablet Take 1 tablet (20 mEq total) by mouth 2 (two) times daily.   tamoxifen  (NOLVADEX ) 20 MG tablet Take 1 tablet (20 mg total) by mouth daily.   No facility-administered encounter medications on file as of 05/02/2024.    Review of Systems:  Review of Systems  Constitutional:  Negative for activity  change and appetite change.  HENT: Negative.    Respiratory:  Negative for cough and shortness of breath.   Cardiovascular:  Negative for leg swelling.  Gastrointestinal:  Negative for constipation.  Genitourinary: Negative.   Musculoskeletal:  Positive for gait problem. Negative for arthralgias and myalgias.  Skin: Negative.   Neurological:  Negative for dizziness and weakness.  Psychiatric/Behavioral:  Positive for confusion. Negative for dysphoric mood and sleep disturbance.     Health Maintenance  Topic Date Due   Zoster Vaccines- Shingrix (1 of 2) 12/17/1940   Medicare Annual Wellness (AWV)  01/03/2024   Influenza Vaccine  02/17/2024   COVID-19 Vaccine (5 - 2025-26 season) 03/19/2024   Pneumococcal Vaccine: 50+ Years  Completed   DEXA SCAN  Completed   Meningococcal B Vaccine  Aged Out  DTaP/Tdap/Td  Discontinued   Mammogram  Discontinued    Physical Exam: Vitals:   05/02/24 1000  BP: 128/72  Pulse: (!) 52  Resp: 18  Temp: 98.1 F (36.7 C)  SpO2: 96%  Weight: 160 lb 3.2 oz (72.7 kg)  Height: 5' 5 (1.651 m)   Body mass index is 26.66 kg/m. Physical Exam Vitals reviewed.  Constitutional:      Appearance: Normal appearance.  HENT:     Head: Normocephalic.     Nose: Nose normal.     Mouth/Throat:     Mouth: Mucous membranes are moist.     Pharynx: Oropharynx is clear.  Eyes:     Pupils: Pupils are equal, round, and reactive to light.  Cardiovascular:     Rate and Rhythm: Normal rate. Rhythm irregular.     Pulses: Normal pulses.     Heart sounds: Normal heart sounds. No murmur heard. Pulmonary:     Effort: Pulmonary effort is normal.     Breath sounds: Normal breath sounds.  Abdominal:     General: Abdomen is flat. Bowel sounds are normal.     Palpations: Abdomen is soft.  Musculoskeletal:        General: Swelling present.     Cervical back: Neck supple.  Skin:    General: Skin is warm.  Neurological:     General: No focal deficit present.      Mental Status: She is alert.  Psychiatric:        Mood and Affect: Mood normal.        Thought Content: Thought content normal.     Labs reviewed: Basic Metabolic Panel: Recent Labs    06/23/23 0831 07/06/23 0259 10/27/23 0920  NA 141 141 141  K 4.5 3.7 4.2  CL 108 108 109  CO2 27 23 24   GLUCOSE 112* 139* 97  BUN 41* 39* 40*  CREATININE 1.14* 1.30* 1.16*  CALCIUM  8.8 8.2* 8.5*  MG 2.3 2.1 2.2  TSH 3.84  --   --    Liver Function Tests: Recent Labs    06/23/23 0831 10/27/23 0920  AST 16 14  ALT 12 11  BILITOT 0.4 0.3  PROT 5.6* 5.8*   No results for input(s): LIPASE, AMYLASE in the last 8760 hours. No results for input(s): AMMONIA in the last 8760 hours. CBC: Recent Labs    06/23/23 0831 07/06/23 0259 10/27/23 0920  WBC 4.4 5.1 4.3  NEUTROABS 2,957  --  2,739  HGB 10.9* 10.3* 10.7*  HCT 35.3 33.9* 33.7*  MCV 85.5 88.5 85.8  PLT 187 171 188   Lipid Panel: Recent Labs    06/23/23 0831  CHOL 121  HDL 47*  LDLCALC 53  TRIG 879  CHOLHDL 2.6   Lab Results  Component Value Date   HGBA1C 5.8 (H) 01/01/2021    Procedures since last visit: No results found.  Assessment/Plan 1. Paroxysmal atrial fibrillation (HCC) (Primary) Eliquis  and Toprol    2. Chronic diastolic heart failure (HCC) Lasix   3. Hypomagnesemia Magnesium  supplement Magnesium  level was normal  4. Gastroesophageal reflux disease,  Pepcid changed from Prilosec for better absorption of Magnesium     5. BREAST CANCER, HX OF  S/p Right Lumpectomy DCIS On Tamoxifen  Indefinitely 6. Hypercholesterolemia On statin  7. Mild cognitive impairment Gets help from her friends and Caregivers and Husband Plan to move to [place]  8. HOH (hard of hearing)   9. Anemia due to stage 3b chronic kidney disease (HCC) Will Do Iron and  B 12     Labs/tests ordered:   Next appt:  05/24/2024

## 2024-05-03 ENCOUNTER — Ambulatory Visit: Payer: MEDICARE | Admitting: Podiatry

## 2024-05-03 ENCOUNTER — Encounter: Payer: Self-pay | Admitting: Podiatry

## 2024-05-03 DIAGNOSIS — D2371 Other benign neoplasm of skin of right lower limb, including hip: Secondary | ICD-10-CM

## 2024-05-03 DIAGNOSIS — D689 Coagulation defect, unspecified: Secondary | ICD-10-CM

## 2024-05-03 DIAGNOSIS — M79676 Pain in unspecified toe(s): Secondary | ICD-10-CM

## 2024-05-03 DIAGNOSIS — B351 Tinea unguium: Secondary | ICD-10-CM | POA: Diagnosis not present

## 2024-05-03 DIAGNOSIS — D2372 Other benign neoplasm of skin of left lower limb, including hip: Secondary | ICD-10-CM | POA: Diagnosis not present

## 2024-05-03 DIAGNOSIS — Z23 Encounter for immunization: Secondary | ICD-10-CM | POA: Diagnosis not present

## 2024-05-03 NOTE — Progress Notes (Signed)
 She presents today chief complaint of painful elongated toenails and calluses bilater al  Objective: Vital signs are stable alert oriented x 3.  Toenails are long thick yellow dystrophic with mycotic multiple benign skin lesions forefoot bilateral.  No open lesions or wounds are noted.  Assessment: Pain in limb secondary to onychomycosis 1 through 5 bilateral.  Painful benign soft tissue lesions bilateral.  Plan: Debridement of toenails 1 through 5 bilateral debridement of benign skin lesions.  Follow-up with her in 3 months

## 2024-05-08 DIAGNOSIS — R278 Other lack of coordination: Secondary | ICD-10-CM | POA: Diagnosis not present

## 2024-05-08 DIAGNOSIS — R2681 Unsteadiness on feet: Secondary | ICD-10-CM | POA: Diagnosis not present

## 2024-05-08 DIAGNOSIS — Z9181 History of falling: Secondary | ICD-10-CM | POA: Diagnosis not present

## 2024-05-08 DIAGNOSIS — M6281 Muscle weakness (generalized): Secondary | ICD-10-CM | POA: Diagnosis not present

## 2024-05-09 DIAGNOSIS — Z9181 History of falling: Secondary | ICD-10-CM | POA: Diagnosis not present

## 2024-05-09 DIAGNOSIS — R2681 Unsteadiness on feet: Secondary | ICD-10-CM | POA: Diagnosis not present

## 2024-05-09 DIAGNOSIS — R278 Other lack of coordination: Secondary | ICD-10-CM | POA: Diagnosis not present

## 2024-05-09 DIAGNOSIS — M6281 Muscle weakness (generalized): Secondary | ICD-10-CM | POA: Diagnosis not present

## 2024-05-15 DIAGNOSIS — M6281 Muscle weakness (generalized): Secondary | ICD-10-CM | POA: Diagnosis not present

## 2024-05-15 DIAGNOSIS — R2681 Unsteadiness on feet: Secondary | ICD-10-CM | POA: Diagnosis not present

## 2024-05-15 DIAGNOSIS — Z9181 History of falling: Secondary | ICD-10-CM | POA: Diagnosis not present

## 2024-05-15 DIAGNOSIS — R278 Other lack of coordination: Secondary | ICD-10-CM | POA: Diagnosis not present

## 2024-05-16 DIAGNOSIS — R278 Other lack of coordination: Secondary | ICD-10-CM | POA: Diagnosis not present

## 2024-05-16 DIAGNOSIS — M6281 Muscle weakness (generalized): Secondary | ICD-10-CM | POA: Diagnosis not present

## 2024-05-16 DIAGNOSIS — Z9181 History of falling: Secondary | ICD-10-CM | POA: Diagnosis not present

## 2024-05-16 DIAGNOSIS — R2681 Unsteadiness on feet: Secondary | ICD-10-CM | POA: Diagnosis not present

## 2024-05-23 DIAGNOSIS — M6281 Muscle weakness (generalized): Secondary | ICD-10-CM | POA: Diagnosis not present

## 2024-05-23 DIAGNOSIS — R278 Other lack of coordination: Secondary | ICD-10-CM | POA: Diagnosis not present

## 2024-05-23 DIAGNOSIS — R41841 Cognitive communication deficit: Secondary | ICD-10-CM | POA: Diagnosis not present

## 2024-05-23 DIAGNOSIS — R2681 Unsteadiness on feet: Secondary | ICD-10-CM | POA: Diagnosis not present

## 2024-05-23 DIAGNOSIS — Z9181 History of falling: Secondary | ICD-10-CM | POA: Diagnosis not present

## 2024-05-24 ENCOUNTER — Non-Acute Institutional Stay: Payer: MEDICARE | Admitting: Nurse Practitioner

## 2024-05-24 DIAGNOSIS — N1832 Chronic kidney disease, stage 3b: Secondary | ICD-10-CM | POA: Diagnosis not present

## 2024-05-24 DIAGNOSIS — E7800 Pure hypercholesterolemia, unspecified: Secondary | ICD-10-CM | POA: Diagnosis not present

## 2024-05-24 DIAGNOSIS — I480 Paroxysmal atrial fibrillation: Secondary | ICD-10-CM | POA: Diagnosis not present

## 2024-05-24 DIAGNOSIS — Z853 Personal history of malignant neoplasm of breast: Secondary | ICD-10-CM | POA: Diagnosis not present

## 2024-05-24 DIAGNOSIS — K219 Gastro-esophageal reflux disease without esophagitis: Secondary | ICD-10-CM | POA: Diagnosis not present

## 2024-05-24 DIAGNOSIS — G3184 Mild cognitive impairment, so stated: Secondary | ICD-10-CM | POA: Diagnosis not present

## 2024-05-24 DIAGNOSIS — I5032 Chronic diastolic (congestive) heart failure: Secondary | ICD-10-CM | POA: Diagnosis not present

## 2024-05-24 DIAGNOSIS — H9190 Unspecified hearing loss, unspecified ear: Secondary | ICD-10-CM | POA: Diagnosis not present

## 2024-05-24 DIAGNOSIS — D631 Anemia in chronic kidney disease: Secondary | ICD-10-CM | POA: Diagnosis not present

## 2024-05-24 LAB — COMPLETE METABOLIC PANEL WITHOUT GFR
AG Ratio: 1.6 (calc) (ref 1.0–2.5)
ALT: 12 U/L (ref 6–29)
AST: 15 U/L (ref 10–35)
Albumin: 3.5 g/dL — ABNORMAL LOW (ref 3.6–5.1)
Alkaline phosphatase (APISO): 60 U/L (ref 37–153)
BUN/Creatinine Ratio: 36 (calc) — ABNORMAL HIGH (ref 6–22)
BUN: 42 mg/dL — ABNORMAL HIGH (ref 7–25)
CO2: 27 mmol/L (ref 20–32)
Calcium: 8.9 mg/dL (ref 8.6–10.4)
Chloride: 109 mmol/L (ref 98–110)
Creat: 1.16 mg/dL — ABNORMAL HIGH (ref 0.60–0.95)
Globulin: 2.2 g/dL (ref 1.9–3.7)
Glucose, Bld: 107 mg/dL — ABNORMAL HIGH (ref 65–99)
Potassium: 4.3 mmol/L (ref 3.5–5.3)
Sodium: 143 mmol/L (ref 135–146)
Total Bilirubin: 0.4 mg/dL (ref 0.2–1.2)
Total Protein: 5.7 g/dL — ABNORMAL LOW (ref 6.1–8.1)

## 2024-05-24 LAB — LIPID PANEL
Cholesterol: 117 mg/dL (ref <200)
HDL: 49 mg/dL — ABNORMAL LOW (ref >=50)
LDL Cholesterol (Calc): 46 mg/dL
Non-HDL Cholesterol (Calc): 68 mg/dL (ref <130)
Total CHOL/HDL Ratio: 2.4 (calc) (ref <5.0)
Triglycerides: 132 mg/dL (ref <150)

## 2024-05-24 LAB — IRON,TIBC AND FERRITIN PANEL
%SAT: 12 % — ABNORMAL LOW (ref 16–45)
Ferritin: 13 ng/mL — ABNORMAL LOW (ref 16–288)
Iron: 43 ug/dL — ABNORMAL LOW (ref 45–160)
TIBC: 373 ug/dL (ref 250–450)

## 2024-05-24 LAB — VITAMIN B12: Vitamin B-12: 277 pg/mL (ref 200–1100)

## 2024-05-24 LAB — CBC WITH DIFFERENTIAL/PLATELET
Absolute Lymphocytes: 823 {cells}/uL — ABNORMAL LOW (ref 850–3900)
Absolute Monocytes: 431 {cells}/uL (ref 200–950)
Basophils Absolute: 31 {cells}/uL (ref 0–200)
Basophils Relative: 0.7 %
Eosinophils Absolute: 62 {cells}/uL (ref 15–500)
Eosinophils Relative: 1.4 %
HCT: 35.6 % (ref 35.0–45.0)
Hemoglobin: 11.2 g/dL — ABNORMAL LOW (ref 11.7–15.5)
MCH: 29.1 pg (ref 27.0–33.0)
MCHC: 31.5 g/dL — ABNORMAL LOW (ref 32.0–36.0)
MCV: 92.5 fL (ref 80.0–100.0)
MPV: 11.4 fL (ref 7.5–12.5)
Monocytes Relative: 9.8 %
Neutro Abs: 3054 {cells}/uL (ref 1500–7800)
Neutrophils Relative %: 69.4 %
Platelets: 173 Thousand/uL (ref 140–400)
RBC: 3.85 Million/uL (ref 3.80–5.10)
RDW: 14 % (ref 11.0–15.0)
Total Lymphocyte: 18.7 %
WBC: 4.4 Thousand/uL (ref 3.8–10.8)

## 2024-05-24 LAB — MAGNESIUM: Magnesium: 2.3 mg/dL (ref 1.5–2.5)

## 2024-05-24 LAB — TSH: TSH: 4.41 m[IU]/L (ref 0.40–4.50)

## 2024-05-25 ENCOUNTER — Other Ambulatory Visit: Payer: Self-pay | Admitting: Internal Medicine

## 2024-05-25 NOTE — Progress Notes (Signed)
 This encounter was created in error - please disregard.

## 2024-05-29 DIAGNOSIS — R2681 Unsteadiness on feet: Secondary | ICD-10-CM | POA: Diagnosis not present

## 2024-05-29 DIAGNOSIS — M6281 Muscle weakness (generalized): Secondary | ICD-10-CM | POA: Diagnosis not present

## 2024-05-29 DIAGNOSIS — Z9181 History of falling: Secondary | ICD-10-CM | POA: Diagnosis not present

## 2024-05-29 DIAGNOSIS — R278 Other lack of coordination: Secondary | ICD-10-CM | POA: Diagnosis not present

## 2024-05-29 DIAGNOSIS — R41841 Cognitive communication deficit: Secondary | ICD-10-CM | POA: Diagnosis not present

## 2024-05-30 DIAGNOSIS — Z9181 History of falling: Secondary | ICD-10-CM | POA: Diagnosis not present

## 2024-05-30 DIAGNOSIS — R278 Other lack of coordination: Secondary | ICD-10-CM | POA: Diagnosis not present

## 2024-05-30 DIAGNOSIS — M6281 Muscle weakness (generalized): Secondary | ICD-10-CM | POA: Diagnosis not present

## 2024-05-30 DIAGNOSIS — R41841 Cognitive communication deficit: Secondary | ICD-10-CM | POA: Diagnosis not present

## 2024-05-30 DIAGNOSIS — R2681 Unsteadiness on feet: Secondary | ICD-10-CM | POA: Diagnosis not present

## 2024-05-31 DIAGNOSIS — R2681 Unsteadiness on feet: Secondary | ICD-10-CM | POA: Diagnosis not present

## 2024-05-31 DIAGNOSIS — R41841 Cognitive communication deficit: Secondary | ICD-10-CM | POA: Diagnosis not present

## 2024-05-31 DIAGNOSIS — M6281 Muscle weakness (generalized): Secondary | ICD-10-CM | POA: Diagnosis not present

## 2024-05-31 DIAGNOSIS — Z9181 History of falling: Secondary | ICD-10-CM | POA: Diagnosis not present

## 2024-05-31 DIAGNOSIS — R278 Other lack of coordination: Secondary | ICD-10-CM | POA: Diagnosis not present

## 2024-06-04 DIAGNOSIS — R2681 Unsteadiness on feet: Secondary | ICD-10-CM | POA: Diagnosis not present

## 2024-06-04 DIAGNOSIS — R278 Other lack of coordination: Secondary | ICD-10-CM | POA: Diagnosis not present

## 2024-06-04 DIAGNOSIS — M6281 Muscle weakness (generalized): Secondary | ICD-10-CM | POA: Diagnosis not present

## 2024-06-04 DIAGNOSIS — Z9181 History of falling: Secondary | ICD-10-CM | POA: Diagnosis not present

## 2024-06-04 DIAGNOSIS — R41841 Cognitive communication deficit: Secondary | ICD-10-CM | POA: Diagnosis not present

## 2024-06-05 ENCOUNTER — Encounter: Payer: Self-pay | Admitting: Nurse Practitioner

## 2024-06-05 ENCOUNTER — Non-Acute Institutional Stay: Payer: MEDICARE | Admitting: Nurse Practitioner

## 2024-06-05 DIAGNOSIS — Z853 Personal history of malignant neoplasm of breast: Secondary | ICD-10-CM

## 2024-06-05 DIAGNOSIS — N1831 Chronic kidney disease, stage 3a: Secondary | ICD-10-CM

## 2024-06-05 DIAGNOSIS — I480 Paroxysmal atrial fibrillation: Secondary | ICD-10-CM | POA: Diagnosis not present

## 2024-06-05 DIAGNOSIS — M6281 Muscle weakness (generalized): Secondary | ICD-10-CM | POA: Diagnosis not present

## 2024-06-05 DIAGNOSIS — D638 Anemia in other chronic diseases classified elsewhere: Secondary | ICD-10-CM

## 2024-06-05 DIAGNOSIS — I5032 Chronic diastolic (congestive) heart failure: Secondary | ICD-10-CM | POA: Diagnosis not present

## 2024-06-05 DIAGNOSIS — K219 Gastro-esophageal reflux disease without esophagitis: Secondary | ICD-10-CM

## 2024-06-05 DIAGNOSIS — R41841 Cognitive communication deficit: Secondary | ICD-10-CM | POA: Diagnosis not present

## 2024-06-05 DIAGNOSIS — Z9181 History of falling: Secondary | ICD-10-CM | POA: Diagnosis not present

## 2024-06-05 DIAGNOSIS — R278 Other lack of coordination: Secondary | ICD-10-CM | POA: Diagnosis not present

## 2024-06-05 DIAGNOSIS — R2681 Unsteadiness on feet: Secondary | ICD-10-CM | POA: Diagnosis not present

## 2024-06-05 NOTE — Assessment & Plan Note (Signed)
 CHF/Edema mild in BLE, taking Furosemide 

## 2024-06-05 NOTE — Progress Notes (Signed)
 Location:  [facility] Room Number: AL37-A Place of Service:  ALF [phone]) Provider:  Omarri Eich X, NP   Patient Care Team: Charlanne Fredia CROME, MD as PCP - General (Internal Medicine) Croitoru, Jerel, MD as PCP - Cardiology (Cardiology) Vanderbilt Ned, MD as Consulting Physician (General Surgery) Jason Charleston, MD (Inactive) as Consulting Physician (Radiation Oncology) Tyree Nanetta SAILOR, RN as Registered Nurse Rosalie Kitchens, MD as Consulting Physician (Gastroenterology) Loretha Ash, MD as Consulting Physician (Hematology and Oncology) Duke, Jon Garre, [place] as Physician Assistant (Cardiology)  Extended Emergency Contact Information Primary Emergency Contact: [facility] Address: [facility]          [address]          [place], [place] [address] [place] Home Phone: [phone] Relation: Spouse Secondary Emergency Contact: harmon,sheri Mobile Phone: [phone] Relation: Other Interpreter needed? No  Code Status:  DNR Goals of care: Advanced Directive information    11/02/2023   10:13 AM  Advanced Directives  Does Patient Have a Medical Advance Directive? Yes  Type of Estate Agent of [place];Living will;Out of facility DNR (pink MOST or yellow form)  Does patient want to make changes to medical advance directive? No - Patient declined  Copy of Healthcare Power of Attorney in Chart? Yes - validated most recent copy scanned in chart (See row information)     Chief Complaint  Patient presents with   Cough    HPI:  Pt is a 89 y.o. female seen today for an acute visit for non productive cough for a few days.    Reported patient's nonproductive cough for several days, denied sore throat, chest pain, shortness of breath, palpitation, GI/urinary symptoms.  She is afebrile, no O2 sat desaturation.  No trouble swallowing at bedside.  No change of appetite, sleeps well at night.  Last BM was  yesterday.  CHF/Edema mild in BLE, taking Furosemide   CKD Bun/creat 42/1.16 05/24/24  Afib, taking Metoprolol , Eliquis    GERD, taking Famotidine, Hgb 11.2 05/24/24  Anemia, Iron and Vit B12 at lower end of normal range.   S/p R lumpectomy, Tamoxifen  indefinitely  Past Medical History:  Diagnosis Date   Allergic rhinitis, seasonal    Breast cancer of upper-outer quadrant of right female breast (HCC) 09/13/2014   Chronic venous insufficiency    Dermatophytosis of nail    Dysrhythmia    H/o Atrial fibrillation   Edema leg    Family history of malignant neoplasm of breast    Family history of malignant neoplasm of ovary    Goiter    right thyroid  nodule    HTN (hypertension) 08/13/2017   HX: breast cancer    bilateral   Menopausal syndrome    Osteoarthritis    Osteoporosis    Squamous cell skin cancer    right lower leg   Wears glasses    Wears hearing aid    both ears   Past Surgical History:  Procedure Laterality Date   bilateral lumpectomies for breast cancer  Aug. 2007   BREAST LUMPECTOMY WITH RADIOACTIVE SEED LOCALIZATION Right 10/01/2014   Procedure: BREAST LUMPECTOMY WITH RADIOACTIVE SEED LOCALIZATION;  Surgeon: Ned Vanderbilt, MD;  Location: [facility];  Service: General;  Laterality: Right;   BREAST LUMPECTOMY WITH RADIOACTIVE SEED LOCALIZATION Left 03/22/2019   Procedure: LEFT BREAST LUMPECTOMY WITH RADIOACTIVE SEED LOCALIZATION;  Surgeon: Vanderbilt Ned, MD;  [facility]: [facility];  Service: General;  Laterality: Left;   BREAST LUMPECTOMY WITH  RADIOACTIVE SEED LOCALIZATION Right 08/18/2021   Procedure: RIGHT BREAST LUMPECTOMY WITH RADIOACTIVE SEED LOCALIZATION;  Surgeon: Vanderbilt Ned, MD;  [facility]: [facility];  Service: General;  Laterality: Right;   CATARACT EXTRACTION     CHOLECYSTECTOMY N/A 08/13/2017   Procedure: LAPAROSCOPIC CHOLECYSTECTOMY WITH INTRAOPERATIVE CHOLANGIOGRAM;  Surgeon: Mikell Katz, MD;  Location: [facility];  Service: General;  Laterality:  N/A;   DILATION AND CURETTAGE OF UTERUS     ESOPHAGOGASTRODUODENOSCOPY  07/26/2011   Procedure: ESOPHAGOGASTRODUODENOSCOPY (EGD);  Surgeon: Oliva FORBES Boots, MD;  Location: [facility];  Service: Endoscopy;  Laterality: N/A;   history of lumbar compression fracture     left eardum sx  1983   left knee arthroscopic surgery     no screening colonoscopy     s/p bilateral lumpectomies     SAVORY DILATION  07/26/2011   Procedure: SAVORY DILATION;  Surgeon: Oliva FORBES Boots, MD;  [facility]: [facility];  Service: Endoscopy;  Laterality: N/A;   status post resection squamous cell cancer right lower leg     TONSILLECTOMY     UMBILICAL HERNIA REPAIR      Allergies  Allergen Reactions   Celecoxib Swelling    Swelling-leg    Outpatient Encounter Medications as of 06/05/2024  Medication Sig   atorvastatin  (LIPITOR ) 40 MG tablet TAKE 1 TABLET BY MOUTH EVERY DAY   ELIQUIS  5 MG TABS tablet TAKE 1 TABLET BY MOUTH TWICE A DAY   famotidine (PEPCID) 20 MG tablet Take 20 mg by mouth daily.   furosemide  (LASIX ) 40 MG tablet TAKE 1.5 TABLETS BY MOUTH DAILY.   guaifenesin  (ROBITUSSIN) 100 MG/5ML syrup Take 10 mLs by mouth every 6 (six) hours as needed for cough. Give 10 ml by mouth every 6 hours as needed for Cough for 2 Days Notify MD is continued cough   Magnesium  Oxide 250 MG TABS Take 1 tablet (250 mg total) by mouth daily.   metoprolol  succinate (TOPROL -XL) 25 MG 24 hr tablet TAKE 1 TABLET BY MOUTH EVERYDAY AT BEDTIME   metoprolol  tartrate (LOPRESSOR ) 25 MG tablet TAKE 1 TABLET BY MOUTH EVERY 8 HOURS AS NEEDED.   montelukast  (SINGULAIR ) 10 MG tablet TAKE 1 TABLET BY MOUTH AT NIGHT FOR COUGH OR WHEEZING   Multiple Vitamins-Minerals (PRESERVISION AREDS 2) CAPS Take 1 capsule by mouth 2 (two) times daily.    potassium chloride  SA (KLOR-CON  M20) 20 MEQ tablet Take 1 tablet (20 mEq total) by mouth 2 (two) times daily.   tamoxifen  (NOLVADEX ) 20 MG tablet Take 1 tablet (20 mg total) by mouth daily.    [DISCONTINUED] ipratropium (ATROVENT ) 0.06 % nasal spray 2 sprays each nostril every 6 (SIX) hours to dry up nose. (Patient not taking: Reported on 06/05/2024)   No facility-administered encounter medications on file as of 06/05/2024.    Review of Systems  Constitutional:  Negative for appetite change, fatigue and fever.  HENT:  Positive for hearing loss. Negative for trouble swallowing.   Respiratory:  Positive for cough. Negative for chest tightness, shortness of breath and wheezing.   Cardiovascular:  Positive for leg swelling. Negative for chest pain and palpitations.  Gastrointestinal:  Negative for abdominal pain and constipation.  Genitourinary:  Positive for frequency. Negative for dysuria and urgency.  Musculoskeletal:  Positive for arthralgias and gait problem.  Skin:  Negative for color change.  Neurological:  Negative for weakness and light-headedness.  Psychiatric/Behavioral:  Negative for confusion and sleep disturbance. The patient is not nervous/anxious.     Immunization History  Administered  Date(s) Administered   Fluad Quad(high Dose 65+) 05/03/2022   INFLUENZA, HIGH DOSE SEASONAL PF 05/03/2013, 06/05/2015, 05/16/2017, 05/02/2019, 04/08/2020, 04/23/2021, 05/03/2024   Influenza Split 04/19/2011, 05/01/2012   Influenza Whole 04/18/2006, 05/02/2007, 04/10/2009, 04/14/2010   Influenza,inj,Quad PF,6+ Mos 04/20/2018   Influenza-Unspecified 05/06/2014, 04/29/2016   Moderna SARS-COV2 Booster Vaccination 12/24/2020, 12/30/2021   Moderna Sars-Covid-2 Vaccination 08/20/2019, 09/17/2019, 06/02/2020   PPD Test 02/08/2011   Pfizer Covid-19 Vaccine Bivalent Booster 77yrs & up 04/08/2021   Pneumococcal Conjugate-13 05/06/2012   Pneumococcal Polysaccharide-23 04/18/2005, 02/08/2011   Td 04/18/2005   Zoster, Live 05/18/2012   Pertinent  Health Maintenance Due  Topic Date Due   Influenza Vaccine  Completed   DEXA SCAN  Completed   Mammogram  Discontinued      03/30/2023    10:54 AM 06/29/2023   10:05 AM 07/06/2023    8:28 AM 11/02/2023   10:13 AM 05/02/2024   10:03 AM  Fall Risk  Falls in the past year? 0 0 0 0 0  Was there an injury with Fall? 0 0 0 0 0  Fall Risk Category Calculator 0 0 0 0 0  Patient at Risk for Falls Due to History of fall(s);Impaired balance/gait;Impaired mobility No Fall Risks No Fall Risks No Fall Risks   Fall risk Follow up Falls evaluation completed Falls evaluation completed Falls evaluation completed Falls evaluation completed Falls evaluation completed   Functional Status Survey:    Vitals:   06/05/24 0958  BP: (!) 126/58  Pulse: 74  Resp: 18  Temp: 98 F (36.7 C)  SpO2: 97%  Weight: 159 lb 12.8 oz (72.5 kg)  Height: 5' 5 (1.651 m)   Body mass index is 26.59 kg/m. Physical Exam Vitals and nursing note reviewed.  Constitutional:      Appearance: Normal appearance.  HENT:     Head: Normocephalic and atraumatic.     Nose: Nose normal.     Mouth/Throat:     Mouth: Mucous membranes are moist.     Pharynx: No posterior oropharyngeal erythema.  Eyes:     Extraocular Movements: Extraocular movements intact.     Conjunctiva/sclera: Conjunctivae normal.     Pupils: Pupils are equal, round, and reactive to light.  Cardiovascular:     Rate and Rhythm: Normal rate. Rhythm irregular.  Pulmonary:     Effort: Pulmonary effort is normal.     Breath sounds: No wheezing or rales.  Abdominal:     General: Bowel sounds are normal.     Palpations: Abdomen is soft.     Tenderness: There is no abdominal tenderness.  Musculoskeletal:     Cervical back: Normal range of motion and neck supple.     Right lower leg: Edema present.     Left lower leg: Edema present.     Comments: 1+ edema BLE  Skin:    General: Skin is warm and dry.     Comments:    Neurological:     General: No focal deficit present.     Mental Status: She is alert and oriented to person, place, and time. Mental status is at baseline.     Motor: No  weakness.     Coordination: Coordination normal.     Gait: Gait abnormal.  Psychiatric:        Mood and Affect: Mood normal.        Behavior: Behavior normal.        Thought Content: Thought content normal.  Judgment: Judgment normal.     Labs reviewed: Recent Labs    07/06/23 0259 10/27/23 0920 05/24/24 0826  NA 141 141 143  K 3.7 4.2 4.3  CL 108 109 109  CO2 23 24 27   GLUCOSE 139* 97 107*  BUN 39* 40* 42*  CREATININE 1.30* 1.16* 1.16*  CALCIUM  8.2* 8.5* 8.9  MG 2.1 2.2 2.3   Recent Labs    06/23/23 0831 10/27/23 0920 05/24/24 0826  AST 16 14 15   ALT 12 11 12   BILITOT 0.4 0.3 0.4  PROT 5.6* 5.8* 5.7*   Recent Labs    06/23/23 0831 07/06/23 0259 10/27/23 0920 05/24/24 0826  WBC 4.4 5.1 4.3 4.4  NEUTROABS 2,957  --  2,739 3,054  HGB 10.9* 10.3* 10.7* 11.2*  HCT 35.3 33.9* 33.7* 35.6  MCV 85.5 88.5 85.8 92.5  PLT 187 171 188 173   Lab Results  Component Value Date   TSH 4.41 05/24/2024   Lab Results  Component Value Date   HGBA1C 5.8 (H) 01/01/2021   Lab Results  Component Value Date   CHOL 117 05/24/2024   HDL 49 (L) 05/24/2024   LDLCALC 46 05/24/2024   LDLDIRECT 173.9 06/25/2013   TRIG 132 05/24/2024   CHOLHDL 2.4 05/24/2024    Significant Diagnostic Results in last 30 days:  No results found.  Assessment/Plan Upper respiratory infection 06/05/2024 reported patient's nonproductive cough for several days, denied sore throat, chest pain, shortness of breath, palpitation, GI/urinary symptoms.  She is afebrile, no O2 sat desaturation.  No trouble swallowing at bedside.  No change of appetite, sleeps well at night.  Last BM was yesterday. Obtain chest x-ray AP/lateral, CBC/differential, CMP/eGFR, COVID test, vital signs/SAT O2 every shift for 72 hours, Tessalon  100 mg 3 times daily for 2 days.  Chronic diastolic heart failure (HCC) CHF/Edema mild in BLE, taking Furosemide   CKD (chronic kidney disease) stage 3, GFR 30-59 ml/min  (HCC) Bun/creat 42/1.16 05/24/24  Paroxysmal atrial fibrillation (HCC) Heart rate is in control,  taking Metoprolol , Eliquis    GERD (gastroesophageal reflux disease) Stable, continue famotidine  Chronic disease anemia Hgb 11.2 05/24/2024 Iron and B12 at lower end of normal range   BREAST CANCER, HX OF Status post lumpectomy of the right breast Tamoxifen  indefinitely    Family/ staff Communication: Plan of care reviewed with the patient, patient's husband-H POA, and the charge nurse  Labs/tests ordered: CXR AP/lateral, CBC/differential, CMP/eGFR

## 2024-06-05 NOTE — Assessment & Plan Note (Signed)
 06/05/2024 reported patient's nonproductive cough for several days, denied sore throat, chest pain, shortness of breath, palpitation, GI/urinary symptoms.  She is afebrile, no O2 sat desaturation.  No trouble swallowing at bedside.  No change of appetite, sleeps well at night.  Last BM was yesterday. Obtain chest x-ray AP/lateral, CBC/differential, CMP/eGFR, COVID test, vital signs/SAT O2 every shift for 72 hours, Tessalon  100 mg 3 times daily for 2 days.

## 2024-06-05 NOTE — Assessment & Plan Note (Signed)
 Stable, continue famotidine

## 2024-06-05 NOTE — Assessment & Plan Note (Signed)
 Bun/creat 42/1.16 05/24/24

## 2024-06-05 NOTE — Assessment & Plan Note (Signed)
 Status post lumpectomy of the right breast Tamoxifen  indefinitely

## 2024-06-05 NOTE — Assessment & Plan Note (Signed)
 Hgb 11.2 05/24/2024 Iron and B12 at lower end of normal range

## 2024-06-05 NOTE — Assessment & Plan Note (Signed)
Heart rate is in control, taking Metoprolol, Eliquis

## 2024-06-06 DIAGNOSIS — M6281 Muscle weakness (generalized): Secondary | ICD-10-CM | POA: Diagnosis not present

## 2024-06-06 DIAGNOSIS — R41841 Cognitive communication deficit: Secondary | ICD-10-CM | POA: Diagnosis not present

## 2024-06-06 DIAGNOSIS — Z9181 History of falling: Secondary | ICD-10-CM | POA: Diagnosis not present

## 2024-06-06 DIAGNOSIS — R278 Other lack of coordination: Secondary | ICD-10-CM | POA: Diagnosis not present

## 2024-06-06 DIAGNOSIS — R2681 Unsteadiness on feet: Secondary | ICD-10-CM | POA: Diagnosis not present

## 2024-06-07 DIAGNOSIS — M6281 Muscle weakness (generalized): Secondary | ICD-10-CM | POA: Diagnosis not present

## 2024-06-07 DIAGNOSIS — R278 Other lack of coordination: Secondary | ICD-10-CM | POA: Diagnosis not present

## 2024-06-07 DIAGNOSIS — R41841 Cognitive communication deficit: Secondary | ICD-10-CM | POA: Diagnosis not present

## 2024-06-07 DIAGNOSIS — R2681 Unsteadiness on feet: Secondary | ICD-10-CM | POA: Diagnosis not present

## 2024-06-07 DIAGNOSIS — Z9181 History of falling: Secondary | ICD-10-CM | POA: Diagnosis not present

## 2024-06-08 DIAGNOSIS — R2681 Unsteadiness on feet: Secondary | ICD-10-CM | POA: Diagnosis not present

## 2024-06-08 DIAGNOSIS — M6281 Muscle weakness (generalized): Secondary | ICD-10-CM | POA: Diagnosis not present

## 2024-06-08 DIAGNOSIS — Z9181 History of falling: Secondary | ICD-10-CM | POA: Diagnosis not present

## 2024-06-08 DIAGNOSIS — R41841 Cognitive communication deficit: Secondary | ICD-10-CM | POA: Diagnosis not present

## 2024-06-08 DIAGNOSIS — R278 Other lack of coordination: Secondary | ICD-10-CM | POA: Diagnosis not present

## 2024-06-11 DIAGNOSIS — Z9181 History of falling: Secondary | ICD-10-CM | POA: Diagnosis not present

## 2024-06-11 DIAGNOSIS — M6281 Muscle weakness (generalized): Secondary | ICD-10-CM | POA: Diagnosis not present

## 2024-06-11 DIAGNOSIS — R2681 Unsteadiness on feet: Secondary | ICD-10-CM | POA: Diagnosis not present

## 2024-06-11 DIAGNOSIS — R41841 Cognitive communication deficit: Secondary | ICD-10-CM | POA: Diagnosis not present

## 2024-06-11 DIAGNOSIS — R278 Other lack of coordination: Secondary | ICD-10-CM | POA: Diagnosis not present

## 2024-06-12 DIAGNOSIS — Z9181 History of falling: Secondary | ICD-10-CM | POA: Diagnosis not present

## 2024-06-12 DIAGNOSIS — R278 Other lack of coordination: Secondary | ICD-10-CM | POA: Diagnosis not present

## 2024-06-12 DIAGNOSIS — R2681 Unsteadiness on feet: Secondary | ICD-10-CM | POA: Diagnosis not present

## 2024-06-12 DIAGNOSIS — M6281 Muscle weakness (generalized): Secondary | ICD-10-CM | POA: Diagnosis not present

## 2024-06-12 DIAGNOSIS — R41841 Cognitive communication deficit: Secondary | ICD-10-CM | POA: Diagnosis not present

## 2024-07-05 ENCOUNTER — Inpatient Hospital Stay: Payer: MEDICARE | Attending: Hematology and Oncology | Admitting: Hematology and Oncology

## 2024-07-05 VITALS — BP 133/36 | HR 82 | Temp 98.0°F | Resp 16 | Wt 165.6 lb

## 2024-07-05 DIAGNOSIS — Z780 Asymptomatic menopausal state: Secondary | ICD-10-CM | POA: Insufficient documentation

## 2024-07-05 DIAGNOSIS — Z803 Family history of malignant neoplasm of breast: Secondary | ICD-10-CM | POA: Diagnosis not present

## 2024-07-05 DIAGNOSIS — Z79810 Long term (current) use of selective estrogen receptor modulators (SERMs): Secondary | ICD-10-CM | POA: Insufficient documentation

## 2024-07-05 DIAGNOSIS — Z8041 Family history of malignant neoplasm of ovary: Secondary | ICD-10-CM | POA: Insufficient documentation

## 2024-07-05 DIAGNOSIS — Z170 Estrogen receptor positive status [ER+]: Secondary | ICD-10-CM | POA: Insufficient documentation

## 2024-07-05 DIAGNOSIS — C50411 Malignant neoplasm of upper-outer quadrant of right female breast: Secondary | ICD-10-CM | POA: Diagnosis present

## 2024-07-05 DIAGNOSIS — Z79899 Other long term (current) drug therapy: Secondary | ICD-10-CM | POA: Diagnosis not present

## 2024-07-05 DIAGNOSIS — Z8249 Family history of ischemic heart disease and other diseases of the circulatory system: Secondary | ICD-10-CM | POA: Diagnosis not present

## 2024-07-05 DIAGNOSIS — D509 Iron deficiency anemia, unspecified: Secondary | ICD-10-CM | POA: Insufficient documentation

## 2024-07-05 DIAGNOSIS — I10 Essential (primary) hypertension: Secondary | ICD-10-CM | POA: Insufficient documentation

## 2024-07-05 DIAGNOSIS — Z9049 Acquired absence of other specified parts of digestive tract: Secondary | ICD-10-CM | POA: Diagnosis not present

## 2024-07-05 DIAGNOSIS — C50412 Malignant neoplasm of upper-outer quadrant of left female breast: Secondary | ICD-10-CM | POA: Diagnosis not present

## 2024-07-05 MED ORDER — FERROUS SULFATE 325 (65 FE) MG PO TABS: 325.0000 mg | ORAL_TABLET | ORAL | 3 refills | Status: AC

## 2024-07-05 NOTE — Progress Notes (Signed)
 [facility]  Telephone:([phone]) [phone] Fax:([phone]) [phone]     ID: [name] DOB: [birth year]  MR#: [id number]  RDW#:[id number]  Patient Care Team: Taylor Fredia CROME, MD as PCP - General (Internal Medicine) Taylor Headland, MD as PCP - Cardiology (Cardiology) Taylor Ned, MD as Consulting Physician (General Surgery) Taylor Charleston, MD (Inactive) as Consulting Physician (Radiation Oncology) Taylor Nanetta SAILOR, RN as Registered Nurse Taylor Kitchens, MD as Consulting Physician (Gastroenterology) Taylor Ash, MD as Consulting Physician (Hematology and Oncology) [name] as Physician Assistant (Cardiology)  CHIEF COMPLAINT: Estrogen receptor positive breast cancer  CURRENT TREATMENT: observation  INTERVAL HISTORY:  Discussed the use of AI scribe software for clinical note transcription with the patient, who gave verbal consent to proceed.  History of Present Illness [name] is a 89 year old female with breast cancer status post lumpectomy and iron deficiency anemia who presents for routine hematology/oncology follow-up.  She underwent lumpectomy for breast cancer in 2023 and has been maintained on tamoxifen  without adverse effects. She reports no new breast symptoms or abnormal bleeding. Her most recent mammogram was in January 2025, with confirmation of the next scheduled mammogram pending. She continues tamoxifen  as previously directed.  Laboratory studies from November 2025 showed low iron levels. She is not currently taking iron supplementation due to facility pharmacy requirements. A prescription for iron has been sent to the appropriate pharmacy.  She recently transitioned to assisted living with her husband and spends most of her time in her apartment. She experiences intermittent knee discomfort with increased ambulation, which limits her activity, but denies constant pain. No other significant changes or new symptoms have occurred since her  last visit.   REVIEW OF SYSTEMS:  A detailed review of systems today was benign.   COVID 19 VACCINATION STATUS: Moderna x4, last 12/2020   BREAST CANCER HISTORY: From the earlier summary note:  I saw Taylor previously for a history of bilateral breast cancers, treated with bilateral lumpectomies and anti-estrogens for 5 years. She did not receive radiation treatments. She was last seen here in 2011.  She had her annual screening mammography at [facility] 08/27/2013, and this showed the breast density to be category B. There was an area of new grouped heterogeneous calcifications in the right breast at the 11:00 middle depth. A focal asymmetric density anterior to that area was seen in one view only. Additional views were recommended, but if they were performed I do not have those records. There appears to have been mammography on 03/05/2014, but again I do not have those records.  On 08/30/2014, bilateral diagnostic mammography with tomosynthesis this was obtained. The calcifications at the 11:00 position in the right breast were increased in number. There were no other significant findings. Biopsy of the area in question to 20 11/06/2014 showed ([id number]) high-grade ductal carcinoma in situ, with mucin extravasation.no definitive invasive component was seen however. This high-grade noninvasive tumor was estrogen receptor positive at 100%, progesterone receptor positive at 44%, both with strong staining intensity.  Her subsequent history is as detailed below   PAST MEDICAL HISTORY: Past Medical History:  Diagnosis Date   Allergic rhinitis, seasonal    Breast cancer of upper-outer quadrant of right female breast (HCC) 09/13/2014   Chronic venous insufficiency    Dermatophytosis of nail    Dysrhythmia    H/o Atrial fibrillation   Edema leg    Family history of malignant neoplasm of breast    Family history of malignant neoplasm of ovary  Goiter    right thyroid  nodule    HTN  (hypertension) 08/13/2017   HX: breast cancer    bilateral   Menopausal syndrome    Osteoarthritis    Osteoporosis    Squamous cell skin cancer    right lower leg   Wears glasses    Wears hearing aid    both ears    PAST SURGICAL HISTORY: Past Surgical History:  Procedure Laterality Date   bilateral lumpectomies for breast cancer  Aug. 2007   BREAST LUMPECTOMY WITH RADIOACTIVE SEED LOCALIZATION Right 10/01/2014   Procedure: BREAST LUMPECTOMY WITH RADIOACTIVE SEED LOCALIZATION;  Surgeon: Taylor Shipper, MD;  Location: [facility];  Service: General;  Laterality: Right;   BREAST LUMPECTOMY WITH RADIOACTIVE SEED LOCALIZATION Left 03/22/2019   Procedure: LEFT BREAST LUMPECTOMY WITH RADIOACTIVE SEED LOCALIZATION;  Surgeon: Marks Debby, MD;  Location: [facility];  Service: General;  Laterality: Left;   BREAST LUMPECTOMY WITH RADIOACTIVE SEED LOCALIZATION Right 08/18/2021   Procedure: RIGHT BREAST LUMPECTOMY WITH RADIOACTIVE SEED LOCALIZATION;  Surgeon: Marks Debby, MD;  Location: [facility];  Service: General;  Laterality: Right;   CATARACT EXTRACTION     CHOLECYSTECTOMY N/A 08/13/2017   Procedure: LAPAROSCOPIC CHOLECYSTECTOMY WITH INTRAOPERATIVE CHOLANGIOGRAM;  Surgeon: Taylor Katz, MD;  Location: [facility];  Service: General;  Laterality: N/A;   DILATION AND CURETTAGE OF UTERUS     ESOPHAGOGASTRODUODENOSCOPY  07/26/2011   Procedure: ESOPHAGOGASTRODUODENOSCOPY (EGD);  Surgeon: Taylor FORBES Boots, MD;  Location: [facility];  Service: Endoscopy;  Laterality: N/A;   history of lumbar compression fracture     left eardum sx  1983   left knee arthroscopic surgery     no screening colonoscopy     s/p bilateral lumpectomies     SAVORY DILATION  07/26/2011   Procedure: SAVORY DILATION;  Surgeon: Taylor FORBES Boots, MD;  Location: [facility];  Service: Endoscopy;  Laterality: N/A;   status post resection squamous cell cancer right lower leg     TONSILLECTOMY     UMBILICAL HERNIA REPAIR       FAMILY HISTORY Family History  Problem Relation Age of Onset   Cancer Mother 48       breast   Heart attack Father    Heart attack Brother    Heart attack Brother    Cancer Maternal Aunt 29       breast   Coronary artery disease Other    Cancer Other        ovarian; daughter of mat aunt w/ BC in 18s   Heart disease Brother    Cancer Maternal Aunt 78       breast  the patient's father died at age 69 from a heart attack. The patient's mother died at age 19. The patient's mother was one of 8 sisters, 9 siblings. 2 of the sisters had breast cancer, postmenopausal. Asenath Marks self had 2 half-brothers and 1 full brother as well as one half-sister.There is no history of ovarian cancer in the familyexcept as noted.   GYNECOLOGIC HISTORY:  No LMP recorded. Patient is postmenopausal. Menarche age 55, the patient is GX P0. 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 Her husband Lamar Beryl Reader Junior is a former psychologist, educational. They currently reside [facility] [place], with no pets.    ADVANCED DIRECTIVES: in place   HEALTH MAINTENANCE: Social History  Tobacco Use   Smoking status: Never   Smokeless tobacco: Never  Vaping Use   Vaping status: Never Used  Substance Use Topics   Alcohol  use: No   Drug use: No     Colonoscopy:  PAP:  Bone density:  Lipid panel:  Allergies  Allergen Reactions   Celecoxib Swelling    Swelling-leg    Current Outpatient Medications  Medication Sig Dispense Refill   atorvastatin  (LIPITOR ) 40 MG tablet TAKE 1 TABLET BY MOUTH EVERY DAY 90 tablet 1   ELIQUIS  5 MG TABS tablet TAKE 1 TABLET BY MOUTH TWICE A DAY 60 tablet 9   famotidine (PEPCID) 20 MG tablet Take 20 mg by mouth daily.     furosemide  (LASIX ) 40 MG tablet TAKE 1.5 TABLETS BY MOUTH DAILY. 135 tablet 3   guaifenesin  (ROBITUSSIN) 100 MG/5ML  syrup Take 10 mLs by mouth every 6 (six) hours as needed for cough. Give 10 ml by mouth every 6 hours as needed for Cough for 2 Days Notify MD is continued cough     Magnesium  Oxide 250 MG TABS Take 1 tablet (250 mg total) by mouth daily.     metoprolol  succinate (TOPROL -XL) 25 MG 24 hr tablet TAKE 1 TABLET BY MOUTH EVERYDAY AT BEDTIME 90 tablet 3   metoprolol  tartrate (LOPRESSOR ) 25 MG tablet TAKE 1 TABLET BY MOUTH EVERY 8 HOURS AS NEEDED. 270 tablet 1   montelukast  (SINGULAIR ) 10 MG tablet TAKE 1 TABLET BY MOUTH AT NIGHT FOR COUGH OR WHEEZING 90 tablet 1   Multiple Vitamins-Minerals (PRESERVISION AREDS 2) CAPS Take 1 capsule by mouth 2 (two) times daily.      potassium chloride  SA (KLOR-CON  M20) 20 MEQ tablet Take 1 tablet (20 mEq total) by mouth 2 (two) times daily. 90 tablet 1   tamoxifen  (NOLVADEX ) 20 MG tablet Take 1 tablet (20 mg total) by mouth daily. 90 tablet 3   No current facility-administered medications for this visit.    OBJECTIVE: white woman using a walker  Vitals:   07/05/24 1409  BP: (!) 133/36  Pulse: 82  Resp: 16  Temp: 98 F (36.7 C)  SpO2: 98%      Body mass index is 27.56 kg/m.    ECOG FS:2 - Symptomatic, <50% confined to bed  Sclerae unicteric, EOMs intact Breasts: Bilateral breasts inspected and palpated.  No new masses noted.  No regional adenopathy Chest: CTA bilaterally No LE edema.  LAB RESULTS:  CMP     Component Value Date/Time   NA 143 05/24/2024 0826   NA 142 05/12/2022 1532   NA 145 09/19/2017 0000   NA 145 09/18/2014 1245   K 4.3 05/24/2024 0826   K 3.7 09/19/2017 0000   K 3.9 09/18/2014 1245   CL 109 05/24/2024 0826   CO2 27 05/24/2024 0826   CO2 25 09/18/2014 1245   GLUCOSE 107 (H) 05/24/2024 0826   GLUCOSE 107 09/18/2014 1245   BUN 42 (H) 05/24/2024 0826   BUN 35 05/12/2022 1532   BUN 20.1 09/18/2014 1245   CREATININE 1.16 (H) 05/24/2024 0826   CREATININE 1.2 (H) 09/18/2014 1245   CALCIUM  8.9 05/24/2024 0826   CALCIUM  7.3  09/19/2017 0000   CALCIUM  9.1 09/18/2014 1245   PROT 5.7 (L) 05/24/2024 0826   PROT 5.1 09/19/2017 0000   PROT 6.8 09/18/2014 1245   ALBUMIN 2.9 (L) 03/10/2023 1557   ALBUMIN 2.8 09/19/2017 0000   ALBUMIN 3.8 09/18/2014 1245   AST 15 05/24/2024 0826  AST 14 (L) 09/15/2021 1255   AST 14 09/18/2014 1245   ALT 12 05/24/2024 0826   ALT 12 09/15/2021 1255   ALT 23 09/19/2017 0000   ALT 8 09/18/2014 1245   ALKPHOS 71 03/10/2023 1557   ALKPHOS 87 09/19/2017 0000   ALKPHOS 100 09/18/2014 1245   BILITOT 0.4 05/24/2024 0826   BILITOT 0.4 09/15/2021 1255   BILITOT 0.49 09/18/2014 1245   GFRNONAA 37 (L) 07/06/2023 0259   GFRNONAA 43 (L) 09/15/2021 1255   GFRNONAA 38 (L) 01/21/2021 0907   GFRAA 44 (L) 01/21/2021 0907    INo results found for: SPEP, UPEP  Lab Results  Component Value Date   WBC 4.4 05/24/2024   NEUTROABS 3,054 05/24/2024   HGB 11.2 (L) 05/24/2024   HCT 35.6 05/24/2024   MCV 92.5 05/24/2024   PLT 173 05/24/2024      Chemistry      Component Value Date/Time   NA 143 05/24/2024 0826   NA 142 05/12/2022 1532   NA 145 09/19/2017 0000   NA 145 09/18/2014 1245   K 4.3 05/24/2024 0826   K 3.7 09/19/2017 0000   K 3.9 09/18/2014 1245   CL 109 05/24/2024 0826   CO2 27 05/24/2024 0826   CO2 25 09/18/2014 1245   BUN 42 (H) 05/24/2024 0826   BUN 35 05/12/2022 1532   BUN 20.1 09/18/2014 1245   CREATININE 1.16 (H) 05/24/2024 0826   CREATININE 1.2 (H) 09/18/2014 1245   GLU 123 07/09/2019 0000      Component Value Date/Time   CALCIUM  8.9 05/24/2024 0826   CALCIUM  7.3 09/19/2017 0000   CALCIUM  9.1 09/18/2014 1245   ALKPHOS 71 03/10/2023 1557   ALKPHOS 87 09/19/2017 0000   ALKPHOS 100 09/18/2014 1245   AST 15 05/24/2024 0826   AST 14 (L) 09/15/2021 1255   AST 14 09/18/2014 1245   ALT 12 05/24/2024 0826   ALT 12 09/15/2021 1255   ALT 23 09/19/2017 0000   ALT 8 09/18/2014 1245   BILITOT 0.4 05/24/2024 0826   BILITOT 0.4 09/15/2021 1255   BILITOT 0.49  09/18/2014 1245       Lab Results  Component Value Date   LABCA2 34 05/20/2011    No components found for: [id number]  No results for input(s): INR in the last 168 hours.  Urinalysis    Component Value Date/Time   COLORURINE YELLOW 08/12/2017 0627   APPEARANCEUR Clear 05/12/2022 1532   LABSPEC 1.014 08/12/2017 0627   PHURINE 6.0 08/12/2017 0627   GLUCOSEU Negative 05/12/2022 1532   HGBUR SMALL (A) 08/12/2017 0627   HGBUR trace-intact 10/28/2009 0905   BILIRUBINUR Negative 05/12/2022 1532   KETONESUR NEGATIVE 08/12/2017 0627   PROTEINUR Negative 05/12/2022 1532   PROTEINUR NEGATIVE 08/12/2017 0627   UROBILINOGEN 0.2 10/28/2009 0905   NITRITE Negative 05/12/2022 1532   NITRITE NEGATIVE 08/12/2017 0627   LEUKOCYTESUR 1+ (A) 05/12/2022 1532    STUDIES: No results found.    ASSESSMENT: 89 y.o. BRCA negative [facility] [place] resident   (1) status post bilateral lumpectomies without sentinel lymph node biopsy in August 2007 for a right-sided 7 mm, grade 2, invasive ductal carcinoma, and a left-sided 8 mm, grade 1, invasive ductal carcinoma.  Both tumors were ER positive, HER-2/neu negative, both with a low proliferation fraction.   (a) On Arimidex from September 2007 until May 2009, at which time we switched to tamoxifen  primarily secondary to concerns regarding osteoporosis, completing five years of antiestrogen September 2011.   (  b)  Also received Zometa yearly, discontinued after 4 doses in 2011  (2)  Status post right breast upper outer quadrant biopsy 09/11/2014 4 a 4.2 cm area of ductal carcinoma in situ, grade 2 or 3,  with extravasated mucin, estrogen and progesterone receptor positive   (3) status post right lumpectomy 10/01/2014 for a pT1a pNX, stage IA invasive ductal carcinoma, grade 2, estrogen  and progesterone receptor positive, HER-2 negative, with an MIB-1 of 38%. Margins were close but negative   (4) she did not need adjuvant radiation   (5) opted  against anti-estrogens  (a)  DEXA scan 10/31/2014 shows a T score of -1.8.  (6)  the BreastNext gene panel performed at [facility] 09/19/2014 showed no deleterious mutations in ATM, BARD1, BRCA1, BRCA2, BRIP1, CDH1, CHEK2, MRE11A, MUTYH, NBN, NF1, PALB2, PTEN, RAD50, RAD51C, RAD51D, or TP53.  (7) status post left lumpectomy 03/28/2019 for a pT1c pNX, stage IA invasive ductal carcinoma, with negative margins; the tumor was strongly estrogen and progesterone receptor positive, HER-2 not amplified, with an MIB-1 of 15%.  (8) she is status post  right lumpectomy on 08/18/2021, final pathology showed grade 2 IDC measuring 4 mm, DCIS, low to intermediate grade, resection margins are negative for invasive carcinoma, closest anterior margin at 2 mm prognostic showed ER 95% strong intensity, PR 95% strong intensity, Ki-67 of less than 1% and HER2 negative.   PLAN   Assessment and Plan Assessment & Plan Breast cancer status post lumpectomy, on tamoxifen  post-lumpectomy, on adj tamoxifen , asymptomatic, no new breast issues, no palpable masses, tolerates tamoxifen  well, mammogram reviewed, no acute oncologic concerns. - Continued tamoxifen  therapy. - Confirmed absence of palpable breast masses. - Instructed caregiver to confirm next mammogram appointment with [facility]  Iron deficiency anemia Confirmed iron deficiency anemia in November 2025, no current iron supplementation, requires prescription through [facility]. - Sent prescription for iron supplementation to [facility] in [place]. - Instructed caregiver to document iron supplementation on facility paperwork.  Most recent labs reviewed, no concerns.  Time spent: 30 min  *Total Encounter Time as defined by the Centers for Medicare and Medicaid Services includes, in addition to the face-to-face time of a patient visit (documented in the note above) non-face-to-face time: obtaining and reviewing outside history, ordering and  reviewing medications, tests or procedures, care coordination (communications with other health care professionals or caregivers) and documentation in the medical record.  Amber Stalls MD

## 2024-08-09 ENCOUNTER — Encounter: Payer: Self-pay | Admitting: Podiatry

## 2024-08-09 ENCOUNTER — Ambulatory Visit: Payer: MEDICARE | Admitting: Podiatry

## 2024-08-09 DIAGNOSIS — B351 Tinea unguium: Secondary | ICD-10-CM

## 2024-08-09 DIAGNOSIS — M79676 Pain in unspecified toe(s): Secondary | ICD-10-CM | POA: Diagnosis not present

## 2024-08-09 DIAGNOSIS — D2372 Other benign neoplasm of skin of left lower limb, including hip: Secondary | ICD-10-CM

## 2024-08-09 DIAGNOSIS — D2371 Other benign neoplasm of skin of right lower limb, including hip: Secondary | ICD-10-CM | POA: Diagnosis not present

## 2024-08-09 DIAGNOSIS — D689 Coagulation defect, unspecified: Secondary | ICD-10-CM

## 2024-08-13 NOTE — Progress Notes (Signed)
 She presents today chief complaint of painful elongated toenails and calluses bilater al  Objective: Vital signs are stable alert oriented x 3.  Toenails are long thick yellow dystrophic with mycotic multiple benign skin lesions forefoot bilateral.  No open lesions or wounds are noted.  Assessment: Pain in limb secondary to onychomycosis 1 through 5 bilateral.  Painful benign soft tissue lesions bilateral.  Plan: Debridement of toenails 1 through 5 bilateral debridement of benign skin lesions.  Follow-up with her in 3 months

## 2024-08-23 ENCOUNTER — Non-Acute Institutional Stay: Payer: Self-pay | Admitting: Internal Medicine

## 2024-08-23 DIAGNOSIS — I5032 Chronic diastolic (congestive) heart failure: Secondary | ICD-10-CM

## 2024-08-23 DIAGNOSIS — G3184 Mild cognitive impairment, so stated: Secondary | ICD-10-CM

## 2024-08-23 DIAGNOSIS — K219 Gastro-esophageal reflux disease without esophagitis: Secondary | ICD-10-CM

## 2024-08-23 DIAGNOSIS — N1831 Chronic kidney disease, stage 3a: Secondary | ICD-10-CM

## 2024-08-23 DIAGNOSIS — I480 Paroxysmal atrial fibrillation: Secondary | ICD-10-CM

## 2024-08-23 DIAGNOSIS — Z853 Personal history of malignant neoplasm of breast: Secondary | ICD-10-CM

## 2024-08-23 DIAGNOSIS — D638 Anemia in other chronic diseases classified elsewhere: Secondary | ICD-10-CM

## 2024-08-24 ENCOUNTER — Encounter: Payer: Self-pay | Admitting: Internal Medicine

## 2024-08-24 MED ORDER — FUROSEMIDE 40 MG PO TABS: 60.0000 mg | ORAL_TABLET | Freq: Every day | ORAL | Status: AC

## 2024-08-24 NOTE — Progress Notes (Signed)
 "  Location:  [facility] Service:  AL[facility] (13)  Provider:   Code Status: DNR Goals of Care:     11/02/2023   10:13 AM  Advanced Directives  Does Patient Have a Medical Advance Directive? Yes  Type of Estate Agent of [place];Living will;Out of facility DNR (pink MOST or yellow form)  Does patient want to make changes to medical advance directive? No - Patient declined  Copy of Healthcare Power of Attorney in Chart? Yes - validated most recent copy scanned in chart (See row information)     Chief Complaint  Patient presents with   Care Management    HPI: Patient is a 89 y.o. female seen today for medical management of chronic diseases.    Lives in [place] in [facility] with her husband    Patient has a history of A-fib on Eliquis , history of bilateral breast cancer s/p lumpectomy and restarted back on tamoxifen   CKD GERD Worsening Cognition  [place]   [place] is now in [place] with her [place] She had a fall this morning when she slid through her bed She did not sustain any injuries  She does need some assist and Cueing for her ADLS She has Gained some weight but did not look like was having any SOB Does have edema in her Legs Her cognition and hearing is getting worse She is able to walk with her walker Planning to work with therapy Wt Readings from Last 3 Encounters:  08/23/24 172 lb (78 kg)  07/05/24 165 lb 9.6 oz (75.1 kg)  06/05/24 159 lb 12.8 oz (72.5 kg)    Past Medical History:  Diagnosis Date   Allergic rhinitis, seasonal    Breast cancer of upper-outer quadrant of right female breast (HCC) 09/13/2014   Chronic venous insufficiency    Dermatophytosis of nail    Dysrhythmia    H/o Atrial fibrillation   Edema leg    Family history of malignant neoplasm of breast    Family history of malignant neoplasm of ovary    Goiter    right thyroid  nodule    HTN (hypertension) 08/13/2017   HX: breast cancer    bilateral    Menopausal syndrome    Osteoarthritis    Osteoporosis    Squamous cell skin cancer    right lower leg   Wears glasses    Wears hearing aid    both ears    Past Surgical History:  Procedure Laterality Date   bilateral lumpectomies for breast cancer  Aug. 2007   BREAST LUMPECTOMY WITH RADIOACTIVE SEED LOCALIZATION Right 10/01/2014   Procedure: BREAST LUMPECTOMY WITH RADIOACTIVE SEED LOCALIZATION;  Surgeon: Debby Shipper, MD;  Location: [facility];  Service: General;  Laterality: Right;   BREAST LUMPECTOMY WITH RADIOACTIVE SEED LOCALIZATION Left 03/22/2019   Procedure: LEFT BREAST LUMPECTOMY WITH RADIOACTIVE SEED LOCALIZATION;  Surgeon: Shipper Debby, MD;  Location: [facility];  Service: General;  Laterality: Left;   BREAST LUMPECTOMY WITH RADIOACTIVE SEED LOCALIZATION Right 08/18/2021   Procedure: RIGHT BREAST LUMPECTOMY WITH RADIOACTIVE SEED LOCALIZATION;  Surgeon: Shipper Debby, MD;  Location: [facility];  Service: General;  Laterality: Right;   CATARACT EXTRACTION     CHOLECYSTECTOMY N/A 08/13/2017   Procedure: LAPAROSCOPIC CHOLECYSTECTOMY WITH INTRAOPERATIVE CHOLANGIOGRAM;  Surgeon: Mikell Katz, MD;  Location: [facility];  Service: General;  Laterality: N/A;   DILATION AND CURETTAGE OF UTERUS     ESOPHAGOGASTRODUODENOSCOPY  07/26/2011   Procedure: ESOPHAGOGASTRODUODENOSCOPY (  EGD);  Surgeon: Oliva FORBES Boots, MD;  Location: [facility];  Service: Endoscopy;  Laterality: N/A;   history of lumbar compression fracture     left eardum sx  1983   left knee arthroscopic surgery     no screening colonoscopy     s/p bilateral lumpectomies     SAVORY DILATION  07/26/2011   Procedure: SAVORY DILATION;  Surgeon: Oliva FORBES Boots, MD;  Location: [facility];  Service: Endoscopy;  Laterality: N/A;   status post resection squamous cell cancer right lower leg     TONSILLECTOMY     UMBILICAL HERNIA REPAIR      Allergies[1]  Outpatient Encounter Medications as of 08/23/2024  Medication Sig    atorvastatin  (LIPITOR ) 40 MG tablet TAKE 1 TABLET BY MOUTH EVERY DAY   ELIQUIS  5 MG TABS tablet TAKE 1 TABLET BY MOUTH TWICE A DAY   famotidine (PEPCID) 20 MG tablet Take 20 mg by mouth daily.   ferrous sulfate  325 (65 FE) MG tablet Take 1 tablet (325 mg total) by mouth every other day.   furosemide  (LASIX ) 40 MG tablet TAKE 1.5 TABLETS BY MOUTH DAILY.   guaifenesin  (ROBITUSSIN) 100 MG/5ML syrup Take 10 mLs by mouth every 6 (six) hours as needed for cough. Give 10 ml by mouth every 6 hours as needed for Cough for 2 Days Notify MD is continued cough   Magnesium  Oxide 250 MG TABS Take 1 tablet (250 mg total) by mouth daily.   metoprolol  succinate (TOPROL -XL) 25 MG 24 hr tablet TAKE 1 TABLET BY MOUTH EVERYDAY AT BEDTIME   metoprolol  tartrate (LOPRESSOR ) 25 MG tablet TAKE 1 TABLET BY MOUTH EVERY 8 HOURS AS NEEDED.   montelukast  (SINGULAIR ) 10 MG tablet TAKE 1 TABLET BY MOUTH AT NIGHT FOR COUGH OR WHEEZING   Multiple Vitamins-Minerals (PRESERVISION AREDS 2) CAPS Take 1 capsule by mouth 2 (two) times daily.    potassium chloride  SA (KLOR-CON  M20) 20 MEQ tablet Take 1 tablet (20 mEq total) by mouth 2 (two) times daily.   tamoxifen  (NOLVADEX ) 20 MG tablet Take 1 tablet (20 mg total) by mouth daily.   No facility-administered encounter medications on file as of 08/23/2024.    Review of Systems:  Review of Systems  Constitutional:  Positive for unexpected weight change. Negative for activity change and appetite change.  HENT: Negative.    Respiratory:  Negative for cough and shortness of breath.   Cardiovascular:  Positive for leg swelling.  Gastrointestinal:  Negative for constipation.  Genitourinary: Negative.   Musculoskeletal:  Positive for gait problem. Negative for arthralgias and myalgias.  Skin: Negative.   Neurological:  Negative for dizziness and weakness.  Psychiatric/Behavioral:  Positive for confusion. Negative for dysphoric mood and sleep disturbance.     Health Maintenance  Topic  Date Due   Zoster Vaccines- Shingrix (1 of 2) 12/17/1940   Medicare Annual Wellness (AWV)  01/03/2024   COVID-19 Vaccine (5 - 2025-26 season) 03/19/2024   Pneumococcal Vaccine: 50+ Years  Completed   Influenza Vaccine  Completed   Bone Density Scan  Completed   Meningococcal B Vaccine  Aged Out   DTaP/Tdap/Td  Discontinued   Mammogram  Discontinued    Physical Exam: Vitals:   08/23/24 1611  BP: 137/71  Pulse: 74  Resp: 18  Temp: (!) 97.5 F (36.4 C)  Weight: 172 lb (78 kg)   Body mass index is 28.62 kg/m. Physical Exam Vitals reviewed.  Constitutional:      Appearance: Normal appearance.  HENT:     Head: Normocephalic.     Nose: Nose normal.     Mouth/Throat:     Mouth: Mucous membranes are moist.     Pharynx: Oropharynx is clear.  Eyes:     Pupils: Pupils are equal, round, and reactive to light.  Cardiovascular:     Rate and Rhythm: Normal rate and regular rhythm.     Pulses: Normal pulses.     Heart sounds: Normal heart sounds. No murmur heard. Pulmonary:     Effort: Pulmonary effort is normal.     Breath sounds: Normal breath sounds.  Abdominal:     General: Abdomen is flat. Bowel sounds are normal.     Palpations: Abdomen is soft.  Musculoskeletal:        General: Swelling present.     Cervical back: Neck supple.  Skin:    General: Skin is warm.  Neurological:     General: No focal deficit present.     Mental Status: She is alert.  Psychiatric:        Mood and Affect: Mood normal.        Thought Content: Thought content normal.     Labs reviewed: Basic Metabolic Panel: Recent Labs    10/27/23 0920 05/24/24 0826  NA 141 143  K 4.2 4.3  CL 109 109  CO2 24 27  GLUCOSE 97 107*  BUN 40* 42*  CREATININE 1.16* 1.16*  CALCIUM  8.5* 8.9  MG 2.2 2.3  TSH  --  4.41   Liver Function Tests: Recent Labs    10/27/23 0920 05/24/24 0826  AST 14 15  ALT 11 12  BILITOT 0.3 0.4  PROT 5.8* 5.7*   No results for input(s): LIPASE, AMYLASE in the  last 8760 hours. No results for input(s): AMMONIA in the last 8760 hours. CBC: Recent Labs    10/27/23 0920 05/24/24 0826  WBC 4.3 4.4  NEUTROABS 2,739 3,054  HGB 10.7* 11.2*  HCT 33.7* 35.6  MCV 85.8 92.5  PLT 188 173   Lipid Panel: Recent Labs    05/24/24 0826  CHOL 117  HDL 49*  LDLCALC 46  TRIG 867  CHOLHDL 2.4   Lab Results  Component Value Date   HGBA1C 5.8 (H) 01/01/2021    Procedures since last visit: No results found.  Assessment/Plan 1. Chronic diastolic heart failure (HCC) (Primary) Her weight is up but other wise she look good Will Continue to monitor D/W the Nurse On Lasix   2. Stage 3a chronic kidney disease (HCC) Stable  3. Paroxysmal atrial fibrillation (HCC) On Eliquis  and Metoprolol   4. Gastroesophageal reflux disease, unspecified whether esophagitis present Pepcid No PPI as it effects her Magnesium   5. Chronic disease anemia On iron  6. BREAST CANCER, HX OF Tamoxifen   7. Hypomagnesemia On Magnesium  No PPI  8. Mild cognitive impairment Doing well in [place]  9 Unstable Gait Is going to work with hterapy  Labs/tests ordered:  * No order type specified * Next appt:  09/19/2024         [1]  Allergies Allergen Reactions   Celecoxib Swelling    Swelling-leg   "

## 2024-09-19 ENCOUNTER — Encounter: Payer: MEDICARE | Admitting: Internal Medicine

## 2024-11-06 ENCOUNTER — Ambulatory Visit: Payer: MEDICARE | Admitting: Podiatry

## 2025-07-05 ENCOUNTER — Inpatient Hospital Stay: Payer: MEDICARE | Admitting: Hematology and Oncology
